# Patient Record
Sex: Male | Born: 1960
Health system: Southern US, Community
[De-identification: ages and names within clinical notes are randomized; demographics above are authoritative.]

## PROBLEM LIST (undated history)

## (undated) DIAGNOSIS — R609 Edema, unspecified: Secondary | ICD-10-CM

## (undated) DIAGNOSIS — R600 Localized edema: Secondary | ICD-10-CM

## (undated) DIAGNOSIS — N289 Disorder of kidney and ureter, unspecified: Secondary | ICD-10-CM

## (undated) DIAGNOSIS — E119 Type 2 diabetes mellitus without complications: Secondary | ICD-10-CM

## (undated) DIAGNOSIS — I509 Heart failure, unspecified: Secondary | ICD-10-CM

## (undated) DIAGNOSIS — Z992 Dependence on renal dialysis: Secondary | ICD-10-CM

## (undated) HISTORY — DX: Dependence on renal dialysis: Z99.2

## (undated) HISTORY — PX: TOE AMPUTATION: SHX809

---

## 2014-03-17 DIAGNOSIS — IMO0002 Reserved for concepts with insufficient information to code with codable children: Secondary | ICD-10-CM | POA: Insufficient documentation

## 2014-03-17 DIAGNOSIS — Z89429 Acquired absence of other toe(s), unspecified side: Secondary | ICD-10-CM | POA: Insufficient documentation

## 2016-03-26 DIAGNOSIS — I10 Essential (primary) hypertension: Secondary | ICD-10-CM | POA: Insufficient documentation

## 2016-03-26 DIAGNOSIS — E1159 Type 2 diabetes mellitus with other circulatory complications: Secondary | ICD-10-CM | POA: Insufficient documentation

## 2016-03-27 DIAGNOSIS — F17200 Nicotine dependence, unspecified, uncomplicated: Secondary | ICD-10-CM | POA: Insufficient documentation

## 2016-04-08 ENCOUNTER — Emergency Department (HOSPITAL_COMMUNITY): Payer: Self-pay

## 2016-04-08 ENCOUNTER — Emergency Department (HOSPITAL_COMMUNITY)
Admission: EM | Admit: 2016-04-08 | Discharge: 2016-04-08 | Disposition: A | Discharge status: Home / Self Care | Payer: Self-pay | Attending: Emergency Medicine | Admitting: Emergency Medicine

## 2016-04-08 ENCOUNTER — Encounter (HOSPITAL_COMMUNITY): Payer: Self-pay | Admitting: Emergency Medicine

## 2016-04-08 DIAGNOSIS — R109 Unspecified abdominal pain: Secondary | ICD-10-CM

## 2016-04-08 DIAGNOSIS — Z794 Long term (current) use of insulin: Secondary | ICD-10-CM | POA: Insufficient documentation

## 2016-04-08 DIAGNOSIS — E119 Type 2 diabetes mellitus without complications: Secondary | ICD-10-CM | POA: Insufficient documentation

## 2016-04-08 DIAGNOSIS — R1033 Periumbilical pain: Secondary | ICD-10-CM | POA: Insufficient documentation

## 2016-04-08 HISTORY — DX: Type 2 diabetes mellitus without complications: E11.9

## 2016-04-08 LAB — URINALYSIS, ROUTINE W REFLEX MICROSCOPIC
Bacteria, UA: NONE SEEN
Bilirubin Urine: NEGATIVE
Glucose, UA: 150 mg/dL — AB
Hgb urine dipstick: NEGATIVE
Ketones, ur: NEGATIVE mg/dL
Nitrite: NEGATIVE
Protein, ur: 100 mg/dL — AB
Specific Gravity, Urine: 1.011 (ref 1.005–1.030)
Squamous Epithelial / HPF: NONE SEEN
pH: 7 (ref 5.0–8.0)

## 2016-04-08 LAB — CBC
HCT: 40.3 % (ref 39.0–52.0)
Hemoglobin: 13.5 g/dL (ref 13.0–17.0)
MCH: 29.2 pg (ref 26.0–34.0)
MCHC: 33.5 g/dL (ref 30.0–36.0)
MCV: 87.2 fL (ref 78.0–100.0)
Platelets: 310 10*3/uL (ref 150–400)
RBC: 4.62 MIL/uL (ref 4.22–5.81)
RDW: 14.4 % (ref 11.5–15.5)
WBC: 7 10*3/uL (ref 4.0–10.5)

## 2016-04-08 LAB — COMPREHENSIVE METABOLIC PANEL
ALT: 14 U/L — ABNORMAL LOW (ref 17–63)
AST: 23 U/L (ref 15–41)
Albumin: 3 g/dL — ABNORMAL LOW (ref 3.5–5.0)
Alkaline Phosphatase: 109 U/L (ref 38–126)
Anion gap: 10 (ref 5–15)
BUN: 31 mg/dL — ABNORMAL HIGH (ref 6–20)
CO2: 24 mmol/L (ref 22–32)
Calcium: 9.3 mg/dL (ref 8.9–10.3)
Chloride: 100 mmol/L — ABNORMAL LOW (ref 101–111)
Creatinine, Ser: 1.64 mg/dL — ABNORMAL HIGH (ref 0.61–1.24)
GFR calc Af Amer: 52 mL/min — ABNORMAL LOW (ref >60)
GFR calc non Af Amer: 45 mL/min — ABNORMAL LOW (ref >60)
Glucose, Bld: 154 mg/dL — ABNORMAL HIGH (ref 65–99)
Potassium: 4.6 mmol/L (ref 3.5–5.1)
Sodium: 134 mmol/L — ABNORMAL LOW (ref 135–145)
Total Bilirubin: 0.4 mg/dL (ref 0.3–1.2)
Total Protein: 7.2 g/dL (ref 6.5–8.1)

## 2016-04-08 LAB — CBG MONITORING, ED: Glucose-Capillary: 163 mg/dL — ABNORMAL HIGH (ref 65–99)

## 2016-04-08 LAB — LIPASE, BLOOD: Lipase: 76 U/L — ABNORMAL HIGH (ref 11–51)

## 2016-04-08 MED ORDER — HYDROMORPHONE HCL 2 MG/ML IJ SOLN
0.5000 mg | INTRAMUSCULAR | Status: DC | PRN
  Administered 2016-04-08: 0.5 mg via INTRAVENOUS
  Filled 2016-04-08: qty 1

## 2016-04-08 MED ORDER — SODIUM CHLORIDE 0.9 % IV BOLUS (SEPSIS)
1000.0000 mL | Freq: Once | INTRAVENOUS | Status: AC
  Administered 2016-04-08: 1000 mL via INTRAVENOUS

## 2016-04-08 MED ORDER — SODIUM CHLORIDE 0.9 % IV SOLN: INTRAVENOUS | Status: DC

## 2016-04-08 MED ORDER — IOPAMIDOL (ISOVUE-300) INJECTION 61%
INTRAVENOUS | Status: AC
  Administered 2016-04-08: 100 mL
  Filled 2016-04-08: qty 100

## 2016-04-08 MED ORDER — ONDANSETRON HCL 4 MG/2ML IJ SOLN
4.0000 mg | Freq: Once | INTRAMUSCULAR | Status: AC
Start: 2016-04-08 — End: 2016-04-08
  Administered 2016-04-08: 4 mg via INTRAVENOUS
  Filled 2016-04-08: qty 2

## 2016-04-08 NOTE — ED Triage Notes (Signed)
Per [facility] called out for "diabetic problems".  Patient complains of abdominal pain that started three weeks ago.  Patient also complains of blood in his stool and vomit that he states "started in 2016 when I got out of prison".  Patient is alert and oriented at this time.  Patient has history of diabetes, CBG from EMS 161.  22g saline lock in left hand.

## 2016-04-08 NOTE — ED Provider Notes (Signed)
Care assumed from Dr. Tomi Bamberger at [facility] with plan for f/u abdominal CT.   Results:  BP 132/86   Pulse 82   Temp 98.6 F (37 C) (Oral)   Resp 22   SpO2 99%   Results for orders placed or performed during the hospital encounter of 04/08/16  Lipase, blood  Result Value Ref Range   Lipase 76 (H) 11 - 51 U/L  Comprehensive metabolic panel  Result Value Ref Range   Sodium 134 (L) 135 - 145 mmol/L   Potassium 4.6 3.5 - 5.1 mmol/L   Chloride 100 (L) 101 - 111 mmol/L   CO2 24 22 - 32 mmol/L   Glucose, Bld 154 (H) 65 - 99 mg/dL   BUN 31 (H) 6 - 20 mg/dL   Creatinine, Ser 1.64 (H) 0.61 - 1.24 mg/dL   Calcium 9.3 8.9 - 10.3 mg/dL   Total Protein 7.2 6.5 - 8.1 g/dL   Albumin 3.0 (L) 3.5 - 5.0 g/dL   AST 23 15 - 41 U/L   ALT 14 (L) 17 - 63 U/L   Alkaline Phosphatase 109 38 - 126 U/L   Total Bilirubin 0.4 0.3 - 1.2 mg/dL   GFR calc non Af Amer 45 (L) >60 mL/min   GFR calc Af Amer 52 (L) >60 mL/min   Anion gap 10 5 - 15  CBC  Result Value Ref Range   WBC 7.0 4.0 - 10.5 K/uL   RBC 4.62 4.22 - 5.81 MIL/uL   Hemoglobin 13.5 13.0 - 17.0 g/dL   HCT 40.3 39.0 - 52.0 %   MCV 87.2 78.0 - 100.0 fL   MCH 29.2 26.0 - 34.0 pg   MCHC 33.5 30.0 - 36.0 g/dL   RDW 14.4 11.5 - 15.5 %   Platelets 310 150 - 400 K/uL  Urinalysis, Routine w reflex microscopic  Result Value Ref Range   Color, Urine STRAW (A) YELLOW   APPearance CLEAR CLEAR   Specific Gravity, Urine 1.011 1.005 - 1.030   pH 7.0 5.0 - 8.0   Glucose, UA 150 (A) NEGATIVE mg/dL   Hgb urine dipstick NEGATIVE NEGATIVE   Bilirubin Urine NEGATIVE NEGATIVE   Ketones, ur NEGATIVE NEGATIVE mg/dL   Protein, ur 100 (A) NEGATIVE mg/dL   Nitrite NEGATIVE NEGATIVE   Leukocytes, UA TRACE (A) NEGATIVE   RBC / HPF 0-5 0 - 5 RBC/hpf   WBC, UA 0-5 0 - 5 WBC/hpf   Bacteria, UA NONE SEEN NONE SEEN   Squamous Epithelial / LPF NONE SEEN NONE SEEN   Mucous PRESENT   CBG monitoring, ED  Result Value Ref Range   Glucose-Capillary 163 (H) 65 - 99 mg/dL     Ct Abdomen Pelvis W Contrast  Result Date: 04/08/2016 CLINICAL DATA:  Stomach pain EXAM: CT ABDOMEN AND PELVIS WITH CONTRAST TECHNIQUE: Multidetector CT imaging of the abdomen and pelvis was performed using the standard protocol following bolus administration of intravenous contrast. CONTRAST:  175mL ISOVUE-300 IOPAMIDOL (ISOVUE-300) INJECTION 61% COMPARISON:  None. FINDINGS: Lower chest: No acute abnormality. Hepatobiliary: No focal liver abnormality is seen. Status post cholecystectomy. No biliary dilatation. Pancreas: Unremarkable. No pancreatic ductal dilatation or surrounding inflammatory changes. Spleen: Normal in size without focal abnormality. Adrenals/Urinary Tract: Adrenal glands are unremarkable. Kidneys are normal, without renal calculi, focal lesion, or hydronephrosis. Bladder is unremarkable. Stomach/Bowel: Stomach is within normal limits. Appendix appears normal. No evidence of bowel wall thickening, distention, or inflammatory changes. Vascular/Lymphatic: Aortic atherosclerosis. No enlarged abdominal or pelvic lymph nodes. Reproductive:  Prostate is unremarkable. Other: No abdominal wall hernia or abnormality. No abdominopelvic ascites. Musculoskeletal: No acute or significant osseous findings. IMPRESSION: No acute abnormality noted. Electronically Signed   By: Inez Catalina M.D.   On: 04/08/2016 18:09    Radiology and laboratory examinations were reviewed by me and used in medical decision making if performed.   MDM:  CT without acute pathologic cause of pain. Cr mildly elevated but no baseline readings available and Pt received IVF for losses. Lipase minimally elevated but non-diagnostic for pancreatitis and no obstruction findings on imaging. Plan to follow up with PCP as needed and return precautions discussed for worsening or new concerning symptoms.   Diagnoses that have been ruled out:  None  Diagnoses that are still under consideration:  None  Final diagnoses:  Abdominal  pain, unspecified abdominal location      Leo Grosser, MD 04/08/16 [phone]

## 2016-04-08 NOTE — ED Provider Notes (Signed)
[facility] DEPT Provider Note   CSN: [id number] Arrival date & time: 04/08/16  1313     History   Chief Complaint Chief Complaint  Patient presents with  . Abdominal Pain    HPI Zachary Young is a 55 y.o. male.  HPI Patient presented to the emergency room with complaints of abdominal pain. Patient states the symptoms started about 3 weeks ago. Pain is coming and going. In the last day or so the symptoms got worse and he had several episodes of nausea and vomiting. The pain is in the periumbilical region. It does not radiate.  He mentions seeing small amounts of blood in the stool in his emesis. This has been a chronic intermittent issue for years however.  He denies any dysuria. No history of any prior surgeries. Nothing seems to make it better or worse. He does not have pain associated with eating. Past Medical History:  Diagnosis Date  . Diabetes mellitus without complication ([place])     There are no active problems to display for this patient.   Past Surgical History:  Procedure Laterality Date  . TOE AMPUTATION Right    due to osteomyelitis       Home Medications    Prior to Admission medications   Medication Sig Start Date End Date Taking? Authorizing Provider  insulin glargine (LANTUS) 100 UNIT/ML injection Inject 60 Units into the skin at bedtime.   Yes Historical Provider, MD  PRESCRIPTION MEDICATION Take 1 tablet by mouth daily.   Yes Historical Provider, MD    Family History No family history on file.  Social History Social History  Substance Use Topics  . Smoking status: Not on file  . Smokeless tobacco: Not on file  . Alcohol use Not on file     Allergies   Eggs or egg-derived products   Review of Systems Review of Systems  All other systems reviewed and are negative.    Physical Exam Updated Vital Signs BP 122/80   Pulse 70   Temp 98.6 F (37 C) (Oral)   Resp 18   SpO2 96%   Physical Exam  Constitutional: He appears  well-developed and well-nourished. No distress.  HENT:  Head: Normocephalic and atraumatic.  Right Ear: External ear normal.  Left Ear: External ear normal.  Eyes: Conjunctivae are normal. Right eye exhibits no discharge. Left eye exhibits no discharge. No scleral icterus.  Neck: Neck supple. No tracheal deviation present.  Cardiovascular: Normal rate, regular rhythm and intact distal pulses.   Pulmonary/Chest: Effort normal and breath sounds normal. No stridor. No respiratory distress. He has no wheezes. He has no rales.  Abdominal: Soft. Bowel sounds are normal. He exhibits no distension. There is tenderness (mild in the periumbilical region). There is no rebound and no guarding.  Musculoskeletal: He exhibits no edema or tenderness.  Neurological: He is alert. He has normal strength. No cranial nerve deficit (no facial droop, extraocular movements intact, no slurred speech) or sensory deficit. He exhibits normal muscle tone. He displays no seizure activity. Coordination normal.  Skin: Skin is warm and dry. No rash noted.  Psychiatric: He has a normal mood and affect.  Nursing note and vitals reviewed.    ED Treatments / Results  Labs (all labs ordered are listed, but only abnormal results are displayed) Labs Reviewed  LIPASE, BLOOD - Abnormal; Notable for the following:       Result Value   Lipase 76 (*)    All other components within normal limits  COMPREHENSIVE METABOLIC PANEL - Abnormal; Notable for the following:    Sodium 134 (*)    Chloride 100 (*)    Glucose, Bld 154 (*)    BUN 31 (*)    Creatinine, Ser 1.64 (*)    Albumin 3.0 (*)    ALT 14 (*)    GFR calc non Af Amer 45 (*)    GFR calc Af Amer 52 (*)    All other components within normal limits  URINALYSIS, ROUTINE W REFLEX MICROSCOPIC - Abnormal; Notable for the following:    Color, Urine STRAW (*)    Glucose, UA 150 (*)    Protein, ur 100 (*)    Leukocytes, UA TRACE (*)    All other components within normal  limits  CBG MONITORING, ED - Abnormal; Notable for the following:    Glucose-Capillary 163 (*)    All other components within normal limits  CBC    Radiology CT abd pelvis performed  Procedures Procedures (including critical care time)  Medications Ordered in ED Medications  sodium chloride 0.9 % bolus 1,000 mL (0 mLs Intravenous Stopped 04/08/16 1658)  ondansetron (ZOFRAN) injection 4 mg (4 mg Intravenous Given 04/08/16 1506)  iopamidol (ISOVUE-300) 61 % injection (100 mLs  Contrast Given 04/08/16 1716)     Initial Impression / Assessment and Plan / ED Course  I have reviewed the triage vital signs and the nursing notes.  Pertinent labs & imaging results that were available during my care of the patient were reviewed by me and considered in my medical decision making (see chart for details).  Clinical Course as of Apr 12 1006  Sun Apr 08, 2016  1612 Pt is feeling better  [JK]    Clinical Course User Index [JK] Dorie Rank, MD   Care was turned over to Dr Laneta Simmers pending CT scan results.  CT scan without acute findings.  Sx improved during ED tx.   Dc home with outpatient follow up  Final Clinical Impressions(s) / ED Diagnoses   Final diagnoses:  Abdominal pain, unspecified abdominal location    New Prescriptions Discharge Medication List as of 04/08/2016  6:31 PM       Dorie Rank, MD 04/12/16 1008

## 2016-04-08 NOTE — ED Notes (Signed)
Patient Alert and oriented X4. Stable and ambulatory. Patient verbalized understanding of the discharge instructions.  Patient belongings were taken by the patient.  

## 2016-05-22 DIAGNOSIS — I252 Old myocardial infarction: Secondary | ICD-10-CM | POA: Insufficient documentation

## 2016-05-22 DIAGNOSIS — Z8739 Personal history of other diseases of the musculoskeletal system and connective tissue: Secondary | ICD-10-CM | POA: Insufficient documentation

## 2018-02-28 DIAGNOSIS — E785 Hyperlipidemia, unspecified: Secondary | ICD-10-CM | POA: Insufficient documentation

## 2018-04-30 DIAGNOSIS — E133399 Other specified diabetes mellitus with moderate nonproliferative diabetic retinopathy without macular edema, unspecified eye: Secondary | ICD-10-CM | POA: Insufficient documentation

## 2018-06-26 DIAGNOSIS — I5042 Chronic combined systolic (congestive) and diastolic (congestive) heart failure: Secondary | ICD-10-CM | POA: Diagnosis present

## 2018-06-26 DIAGNOSIS — I5043 Acute on chronic combined systolic (congestive) and diastolic (congestive) heart failure: Secondary | ICD-10-CM | POA: Diagnosis present

## 2018-06-26 DIAGNOSIS — N184 Chronic kidney disease, stage 4 (severe): Secondary | ICD-10-CM | POA: Diagnosis present

## 2018-12-14 ENCOUNTER — Other Ambulatory Visit: Payer: Self-pay

## 2018-12-14 ENCOUNTER — Inpatient Hospital Stay
Admission: EM | Admit: 2018-12-14 | Discharge: 2018-12-15 | DRG: 378 | Discharge status: Against Medical Advice | Payer: Medicaid Other | Attending: Internal Medicine | Admitting: Internal Medicine

## 2018-12-14 ENCOUNTER — Emergency Department: Payer: Medicaid Other

## 2018-12-14 ENCOUNTER — Encounter: Payer: Self-pay | Admitting: Emergency Medicine

## 2018-12-14 DIAGNOSIS — Z794 Long term (current) use of insulin: Secondary | ICD-10-CM

## 2018-12-14 DIAGNOSIS — K219 Gastro-esophageal reflux disease without esophagitis: Secondary | ICD-10-CM | POA: Diagnosis present

## 2018-12-14 DIAGNOSIS — K920 Hematemesis: Secondary | ICD-10-CM | POA: Diagnosis present

## 2018-12-14 DIAGNOSIS — E785 Hyperlipidemia, unspecified: Secondary | ICD-10-CM | POA: Diagnosis present

## 2018-12-14 DIAGNOSIS — N179 Acute kidney failure, unspecified: Secondary | ICD-10-CM | POA: Diagnosis present

## 2018-12-14 DIAGNOSIS — E860 Dehydration: Secondary | ICD-10-CM | POA: Diagnosis present

## 2018-12-14 DIAGNOSIS — Z885 Allergy status to narcotic agent status: Secondary | ICD-10-CM | POA: Diagnosis not present

## 2018-12-14 DIAGNOSIS — Z91030 Bee allergy status: Secondary | ICD-10-CM | POA: Diagnosis not present

## 2018-12-14 DIAGNOSIS — I110 Hypertensive heart disease with heart failure: Secondary | ICD-10-CM | POA: Diagnosis present

## 2018-12-14 DIAGNOSIS — I509 Heart failure, unspecified: Secondary | ICD-10-CM | POA: Diagnosis present

## 2018-12-14 DIAGNOSIS — F17210 Nicotine dependence, cigarettes, uncomplicated: Secondary | ICD-10-CM | POA: Diagnosis present

## 2018-12-14 DIAGNOSIS — K274 Chronic or unspecified peptic ulcer, site unspecified, with hemorrhage: Secondary | ICD-10-CM | POA: Diagnosis present

## 2018-12-14 DIAGNOSIS — Z79899 Other long term (current) drug therapy: Secondary | ICD-10-CM

## 2018-12-14 DIAGNOSIS — D62 Acute posthemorrhagic anemia: Secondary | ICD-10-CM | POA: Diagnosis present

## 2018-12-14 DIAGNOSIS — E1165 Type 2 diabetes mellitus with hyperglycemia: Secondary | ICD-10-CM | POA: Diagnosis present

## 2018-12-14 DIAGNOSIS — Z89421 Acquired absence of other right toe(s): Secondary | ICD-10-CM | POA: Diagnosis not present

## 2018-12-14 DIAGNOSIS — K2971 Gastritis, unspecified, with bleeding: Secondary | ICD-10-CM | POA: Diagnosis present

## 2018-12-14 DIAGNOSIS — Z91012 Allergy to eggs: Secondary | ICD-10-CM

## 2018-12-14 DIAGNOSIS — Z20828 Contact with and (suspected) exposure to other viral communicable diseases: Secondary | ICD-10-CM | POA: Diagnosis present

## 2018-12-14 DIAGNOSIS — K922 Gastrointestinal hemorrhage, unspecified: Secondary | ICD-10-CM | POA: Diagnosis present

## 2018-12-14 LAB — CBC WITH DIFFERENTIAL/PLATELET
Abs Immature Granulocytes: 0.02 10*3/uL (ref 0.00–0.07)
Basophils Absolute: 0 10*3/uL (ref 0.0–0.1)
Basophils Relative: 1 %
Eosinophils Absolute: 0.1 10*3/uL (ref 0.0–0.5)
Eosinophils Relative: 2 %
HCT: 38.8 % — ABNORMAL LOW (ref 39.0–52.0)
Hemoglobin: 12.5 g/dL — ABNORMAL LOW (ref 13.0–17.0)
Immature Granulocytes: 0 %
Lymphocytes Relative: 27 %
Lymphs Abs: 2.1 10*3/uL (ref 0.7–4.0)
MCH: 25.7 pg — ABNORMAL LOW (ref 26.0–34.0)
MCHC: 32.2 g/dL (ref 30.0–36.0)
MCV: 79.8 fL — ABNORMAL LOW (ref 80.0–100.0)
Monocytes Absolute: 0.6 10*3/uL (ref 0.1–1.0)
Monocytes Relative: 7 %
Neutro Abs: 5 10*3/uL (ref 1.7–7.7)
Neutrophils Relative %: 63 %
Platelets: 321 10*3/uL (ref 150–400)
RBC: 4.86 MIL/uL (ref 4.22–5.81)
RDW: 16 % — ABNORMAL HIGH (ref 11.5–15.5)
WBC: 7.8 10*3/uL (ref 4.0–10.5)
nRBC: 0 % (ref 0.0–0.2)

## 2018-12-14 LAB — COMPREHENSIVE METABOLIC PANEL
ALT: 12 U/L (ref 0–44)
AST: 13 U/L — ABNORMAL LOW (ref 15–41)
Albumin: 2.5 g/dL — ABNORMAL LOW (ref 3.5–5.0)
Alkaline Phosphatase: 171 U/L — ABNORMAL HIGH (ref 38–126)
Anion gap: 9 (ref 5–15)
BUN: 22 mg/dL — ABNORMAL HIGH (ref 6–20)
CO2: 26 mmol/L (ref 22–32)
Calcium: 8 mg/dL — ABNORMAL LOW (ref 8.9–10.3)
Chloride: 97 mmol/L — ABNORMAL LOW (ref 98–111)
Creatinine, Ser: 2.21 mg/dL — ABNORMAL HIGH (ref 0.61–1.24)
GFR calc Af Amer: 37 mL/min — ABNORMAL LOW (ref >60)
GFR calc non Af Amer: 32 mL/min — ABNORMAL LOW (ref >60)
Glucose, Bld: 408 mg/dL — ABNORMAL HIGH (ref 70–99)
Potassium: 3.5 mmol/L (ref 3.5–5.1)
Sodium: 132 mmol/L — ABNORMAL LOW (ref 135–145)
Total Bilirubin: 0.3 mg/dL (ref 0.3–1.2)
Total Protein: 7.1 g/dL (ref 6.5–8.1)

## 2018-12-14 LAB — TYPE AND SCREEN
ABO/RH(D): B POS
Antibody Screen: NEGATIVE

## 2018-12-14 LAB — GLUCOSE, CAPILLARY: Glucose-Capillary: 372 mg/dL — ABNORMAL HIGH (ref 70–99)

## 2018-12-14 LAB — PROTIME-INR
INR: 1 (ref 0.8–1.2)
Prothrombin Time: 13.2 seconds (ref 11.4–15.2)

## 2018-12-14 LAB — TROPONIN I (HIGH SENSITIVITY)
Troponin I (High Sensitivity): 21 ng/L — ABNORMAL HIGH (ref <18)
Troponin I (High Sensitivity): 20 ng/L — ABNORMAL HIGH (ref <18)

## 2018-12-14 LAB — APTT: aPTT: 31 seconds (ref 24–36)

## 2018-12-14 LAB — SARS CORONAVIRUS 2 BY RT PCR (HOSPITAL ORDER, PERFORMED IN ~~LOC~~ HOSPITAL LAB): SARS Coronavirus 2: NEGATIVE

## 2018-12-14 MED ORDER — METOCLOPRAMIDE HCL 5 MG/ML IJ SOLN
10.0000 mg | Freq: Once | INTRAMUSCULAR | Status: AC
  Administered 2018-12-14: 10 mg via INTRAVENOUS
  Filled 2018-12-14: qty 2

## 2018-12-14 MED ORDER — SODIUM CHLORIDE 0.9 % IV SOLN
INTRAVENOUS | Status: DC
  Administered 2018-12-15: 01:00:00 via INTRAVENOUS

## 2018-12-14 MED ORDER — INSULIN ASPART 100 UNIT/ML ~~LOC~~ SOLN
0.0000 [IU] | Freq: Every day | SUBCUTANEOUS | Status: DC
  Administered 2018-12-14: 5 [IU] via SUBCUTANEOUS

## 2018-12-14 MED ORDER — ONDANSETRON HCL 4 MG/2ML IJ SOLN: 4.0000 mg | Freq: Four times a day (QID) | INTRAMUSCULAR | Status: DC | PRN

## 2018-12-14 MED ORDER — SODIUM CHLORIDE 0.9 % IV SOLN
80.0000 mg | Freq: Once | INTRAVENOUS | Status: AC
  Administered 2018-12-14: 80 mg via INTRAVENOUS
  Filled 2018-12-14: qty 80

## 2018-12-14 MED ORDER — ONDANSETRON HCL 4 MG PO TABS: 4.0000 mg | ORAL_TABLET | Freq: Four times a day (QID) | ORAL | Status: DC | PRN

## 2018-12-14 MED ORDER — ONDANSETRON HCL 4 MG/2ML IJ SOLN
4.0000 mg | Freq: Once | INTRAMUSCULAR | Status: AC
  Administered 2018-12-14: 4 mg via INTRAVENOUS
  Filled 2018-12-14: qty 2

## 2018-12-14 MED ORDER — INSULIN ASPART 100 UNIT/ML ~~LOC~~ SOLN
0.0000 [IU] | Freq: Three times a day (TID) | SUBCUTANEOUS | Status: DC
  Administered 2018-12-15: 8 [IU] via SUBCUTANEOUS
  Filled 2018-12-14: qty 1

## 2018-12-14 MED ORDER — SODIUM CHLORIDE 0.9% FLUSH
3.0000 mL | Freq: Two times a day (BID) | INTRAVENOUS | Status: DC
  Administered 2018-12-15: 3 mL via INTRAVENOUS

## 2018-12-14 MED ORDER — INSULIN ASPART 100 UNIT/ML ~~LOC~~ SOLN
SUBCUTANEOUS | Status: AC
  Filled 2018-12-14: qty 1

## 2018-12-14 MED ORDER — GABAPENTIN 400 MG PO CAPS: 400.0000 mg | ORAL_CAPSULE | Freq: Every day | ORAL | Status: DC

## 2018-12-14 MED ORDER — SODIUM CHLORIDE 0.9 % IV SOLN
80.0000 mg | Freq: Once | INTRAVENOUS | Status: DC
  Filled 2018-12-14: qty 80

## 2018-12-14 MED ORDER — IOHEXOL 300 MG/ML  SOLN
100.0000 mL | Freq: Once | INTRAMUSCULAR | Status: AC | PRN
  Administered 2018-12-14: 100 mL via INTRAVENOUS

## 2018-12-14 MED ORDER — HYDROMORPHONE HCL 1 MG/ML IJ SOLN
1.0000 mg | Freq: Once | INTRAMUSCULAR | Status: AC
  Administered 2018-12-14: 1 mg via INTRAVENOUS
  Filled 2018-12-14: qty 1

## 2018-12-14 MED ORDER — AMLODIPINE BESYLATE 5 MG PO TABS
10.0000 mg | ORAL_TABLET | Freq: Every day | ORAL | Status: DC
  Filled 2018-12-14: qty 2

## 2018-12-14 MED ORDER — PANTOPRAZOLE SODIUM 40 MG IV SOLR: 40.0000 mg | Freq: Two times a day (BID) | INTRAVENOUS | Status: DC

## 2018-12-14 MED ORDER — FUROSEMIDE 40 MG PO TABS
80.0000 mg | ORAL_TABLET | Freq: Every day | ORAL | Status: DC
  Filled 2018-12-14: qty 2

## 2018-12-14 MED ORDER — INSULIN GLARGINE 100 UNIT/ML ~~LOC~~ SOLN
10.0000 [IU] | Freq: Every day | SUBCUTANEOUS | Status: DC
  Filled 2018-12-14 (×2): qty 0.1

## 2018-12-14 MED ORDER — SODIUM CHLORIDE 0.9 % IV SOLN
8.0000 mg/h | INTRAVENOUS | Status: DC
  Administered 2018-12-15: 8 mg/h via INTRAVENOUS
  Filled 2018-12-14: qty 80

## 2018-12-14 MED ORDER — FENTANYL CITRATE (PF) 100 MCG/2ML IJ SOLN
100.0000 ug | Freq: Once | INTRAMUSCULAR | Status: AC
  Administered 2018-12-14: 100 ug via INTRAVENOUS
  Filled 2018-12-14: qty 2

## 2018-12-14 MED ORDER — ATORVASTATIN CALCIUM 20 MG PO TABS: 80.0000 mg | ORAL_TABLET | Freq: Every day | ORAL | Status: DC

## 2018-12-14 MED ORDER — METOPROLOL SUCCINATE ER 50 MG PO TB24
100.0000 mg | ORAL_TABLET | Freq: Every day | ORAL | Status: DC
  Filled 2018-12-14: qty 2

## 2018-12-14 NOTE — ED Triage Notes (Signed)
Pt presents to ED c/o hematemesis since last night. Pt states x4 episodes. Denies diarrhea. C/o 10/10 mid abd pain. CBG 491 with EMS, hx DM. VS WNL.

## 2018-12-14 NOTE — H&P (Signed)
[facility] NAME: Zachary Young    MR#:  [id number]  DATE OF BIRTH:  [birth year]  DATE OF ADMISSION:  12/14/2018  PRIMARY CARE PHYSICIAN: Patient, No Pcp Per   REQUESTING/REFERRING PHYSICIAN: Marjean Donna, MD CHIEF COMPLAINT:   Chief Complaint  Patient presents with  . GI Bleeding    HISTORY OF PRESENT ILLNESS:  Zachary Young  is a 58 y.o. male with a known history of diabetes, hypertension, heart failure who presents with hematemesis.  Patient endorses 4 episodes of large-volume dark bloody hematemesis.  Denies any vomiting without blood in it prior to arrival.  S/S have been intermittent, onset last night.  Nothing makes it better or worse.  He has associated upper abdominal cramping pain.  He denies diarrhea.  Denies NSAID use.  Denies alcohol use or liver issues.  He endorses a history of diabetes mellitus and GERD.  No family members or close contacts with similar symptoms.  He denies a prior history of GI bleed.  Hemoglobin is 12.5 with hematocrit 38.8 and platelet count 321 on arrival.  Creatinine is 2.21 with BUN 22.  Glucose is 408 on arrival.  Rapid COVID-19 testing is negative.  Shows no acute findings.  CT abdomen and pelvis demonstrates no acute findings.  We have admitted him to the hospitalist service for further management.  PAST MEDICAL HISTORY:   Past Medical History:  Diagnosis Date  . Diabetes mellitus without complication ([place])     PAST SURGICAL HISTORY:   Past Surgical History:  Procedure Laterality Date  . TOE AMPUTATION Right    due to osteomyelitis    SOCIAL HISTORY:   Social History   Tobacco Use  . Smoking status: Current Every Day Smoker    Packs/day: 1.00    Types: Cigarettes  . Smokeless tobacco: Never Used  Substance Use Topics  . Alcohol use: Not Currently    FAMILY HISTORY:  History reviewed. No pertinent family history.  DRUG ALLERGIES:   Allergies  Allergen Reactions  .  Bee Venom   . Eggs Or Egg-Derived Products   . Morphine And Related     REVIEW OF SYSTEMS:   Review of Systems  Constitutional: Positive for malaise/fatigue. Negative for chills and fever.  HENT: Negative for congestion and sore throat.   Eyes: Negative for blurred vision and double vision.  Respiratory: Negative for cough, hemoptysis, shortness of breath and wheezing.   Cardiovascular: Negative for chest pain and palpitations.  Gastrointestinal: Positive for abdominal pain, nausea (Hematemesis) and vomiting. Negative for blood in stool, constipation, diarrhea, heartburn and melena.  Genitourinary: Negative for dysuria, flank pain and hematuria.  Musculoskeletal: Negative for falls and myalgias.  Skin: Negative for itching and rash.  Neurological: Negative for dizziness, focal weakness and headaches.  Psychiatric/Behavioral: Negative for depression.   MEDICATIONS AT HOME:   Prior to Admission medications   Medication Sig Start Date End Date Taking? Authorizing Provider  amLODipine (NORVASC) 10 MG tablet Take 10 mg by mouth daily.  08/04/18  Yes [provider]  atorvastatin (LIPITOR) 80 MG tablet Take 80 mg by mouth daily at 6 PM.  08/04/18  Yes [provider]  furosemide (LASIX) 80 MG tablet Take 80 mg by mouth daily.  12/03/18 03/03/19 Yes [provider]  gabapentin (NEURONTIN) 400 MG capsule Take 400 mg by mouth at bedtime.  08/15/18  Yes [provider]  insulin glargine (LANTUS) 100 UNIT/ML injection Inject 10 Units into the  skin at bedtime.    Yes [provider]  insulin lispro (HUMALOG) 100 UNIT/ML injection Inject into the skin 3 (three) times daily with meals.  12/03/18 01/02/19 Yes [provider]  metoprolol succinate (TOPROL-XL) 100 MG 24 hr tablet Take 100 mg by mouth daily.  12/04/18 01/03/19 Yes [provider]  oxyCODONE-acetaminophen (PERCOCET/ROXICET) 5-325 MG tablet Take 1-2 tablets by mouth every 6 (six) hours  as needed.  12/09/18 12/14/18 Yes [provider]  polyethylene glycol (MIRALAX / GLYCOLAX) 17 g packet Take by mouth. 12/03/18 01/02/19 Yes [provider]  PRESCRIPTION MEDICATION Take 1 tablet by mouth daily.   Yes [provider]      VITAL SIGNS:  Blood pressure 101/86, pulse 90, temperature 98.8 F (37.1 C), temperature source Oral, resp. rate 18, height 6\' 2"  (1.88 m), weight (!) 140.6 kg, SpO2 100 %.  PHYSICAL EXAMINATION:  Physical Exam  GENERAL:  58 y.o.-year-old patient lying in the bed with no acute distress.  EYES: Pupils equal, round, reactive to light and accommodation. No scleral icterus. Extraocular muscles intact.  HEENT: Head atraumatic, normocephalic. Oropharynx and nasopharynx clear.  NECK:  Supple, no jugular venous distention. No thyroid enlargement, no tenderness.  LUNGS: Normal breath sounds bilaterally, no wheezing, rales,rhonchi or crepitation. No use of accessory muscles of respiration.  CARDIOVASCULAR: Regular rate and rhythm, S1, S2 normal. No murmurs, rubs, or gallops.  ABDOMEN: Soft, nondistended, diffuse upper abdominal tenderness. Bowel sounds present. No organomegaly or mass.  EXTREMITIES: No pedal edema, cyanosis, or clubbing.  NEUROLOGIC: Cranial nerves II through XII are intact. Muscle strength 5/5 in all extremities. Sensation intact. Gait not checked.  PSYCHIATRIC: The patient is alert and oriented x 3.  Normal affect and good eye contact. SKIN: No obvious rash, lesion, or ulcer.   LABORATORY PANEL:   CBC Recent Labs  Lab 12/14/18 1925  WBC 7.8  HGB 12.5*  HCT 38.8*  PLT 321   ------------------------------------------------------------------------------------------------------------------  Chemistries  Recent Labs  Lab 12/14/18 1925  NA 132*  K 3.5  CL 97*  CO2 26  GLUCOSE 408*  BUN 22*  CREATININE 2.21*  CALCIUM 8.0*  AST 13*  ALT 12  ALKPHOS 171*  BILITOT 0.3    ------------------------------------------------------------------------------------------------------------------  Cardiac Enzymes No results for input(s): TROPONINI in the last 168 hours. ------------------------------------------------------------------------------------------------------------------  RADIOLOGY:  Ct Abdomen Pelvis W Contrast  Result Date: 12/14/2018 CLINICAL DATA:  58 year old male with acute abdominal and pelvic pain and vomiting. EXAM: CT ABDOMEN AND PELVIS WITH CONTRAST TECHNIQUE: Multidetector CT imaging of the abdomen and pelvis was performed using the standard protocol following bolus administration of intravenous contrast. CONTRAST:  123mL OMNIPAQUE IOHEXOL 300 MG/ML  SOLN COMPARISON:  04/08/2016 CT FINDINGS: Lower chest: No acute abnormality Hepatobiliary: The liver is unremarkable. The patient is status post cholecystectomy. No biliary dilatation. Pancreas: Unremarkable Spleen: Unremarkable Adrenals/Urinary Tract: The kidneys, adrenal glands and bladder are unremarkable. Stomach/Bowel: Stomach is within normal limits. Appendix appears normal. No evidence of bowel wall thickening, distention, or inflammatory changes. Vascular/Lymphatic: Aortic atherosclerotic calcifications noted without evidence of aneurysm. [place] bilateral inguinal and pelvic lymph nodes are unchanged. No new or enlarging lymph nodes are identified. Reproductive: Prostate is unremarkable. Other: A small RIGHT inguinal hernia containing fat is unchanged. There is no evidence of ascites, focal collection or pneumoperitoneum. Musculoskeletal: No acute or suspicious bony abnormality. IMPRESSION: 1. No evidence of acute abnormality. No findings to suggest a cause for this patient's abdominal pain. 2.  Aortic Atherosclerosis (ICD10-I70.0). Electronically Signed  By: Margarette Canada M.D.   On: 12/14/2018 20:34   Dg Chest Portable 1 View  Result Date: 12/14/2018 CLINICAL DATA:  Acute shortness of breath EXAM:  PORTABLE CHEST 1 VIEW COMPARISON:  None FINDINGS: The cardiomediastinal silhouette is unremarkable. Mild peribronchial thickening noted. There is no evidence of focal airspace disease, pulmonary edema, suspicious pulmonary nodule/mass, pleural effusion, or pneumothorax. No acute bony abnormalities are identified. IMPRESSION: Mild peribronchial thickening of uncertain chronicity. Electronically Signed   By: Margarette Canada M.D.   On: 12/14/2018 19:12      IMPRESSION AND PLAN:   1.  GI bleed - With large-volume hemoptysis - We will continue monitoring hemoglobin and hematocrit every 6 hours and transfuse as indicated - Dr. Alice Reichert with gastroenterology has been consulted for further evaluation and recommendations - We will hold patient n.p.o. for possible endoscopy in the a.m. -Protonix infusion initiated  2.  Acute renal failure - Possibly secondary to volume loss with vomiting. - We will continue normal saline to peripheral IV at 75 cc/h -Will repeat BMP and continue to monitor renal function closely  3.  Abdominal pain -We will treat with IV analgesic  4.  Diabetes mellitus - Moderate sliding scale insulin  DVT prophylaxis with SCDs and PPI initiated    All the records are reviewed and case discussed with ED provider. The plan of care was discussed in details with the patient (and family). I answered all questions. The patient agreed to proceed with the above mentioned plan. Further management will depend upon hospital course.   CODE STATUS: Full code  TOTAL TIME TAKING CARE OF THIS PATIENT: 45 minutes.    [place] 12/14/2018 at 11:07 PM  Pager - [phone]  After 6pm go to [email or web address] - Proofreader  [facility]  Hospitalists  Office  [phone]  CC: Primary care physician; Patient, No Pcp Per   Note: This dictation was prepared with Dragon dictation along with smaller phrase technology. Any transcriptional errors that result from  this process are unintentional.

## 2018-12-14 NOTE — ED Provider Notes (Signed)
[facility] Emergency Department Provider Note  ____________________________________________   First MD Initiated Contact with Patient 12/14/18 1823     (approximate)  I have reviewed the triage vital signs and the nursing notes.   HISTORY  Chief Complaint GI Bleeding    HPI Zachary Young is a 58 y.o. male with diabetes, hypertension, heart failure who presents with hematemesis.  Patient endorses 4 episodes of large-volume o hematemesis.  Denies any vomiting without blood in it prior to arrival.  He is been intermittent, onset last night.  Nothing makes it better or worse.  He has associated severe abdominal pain.  He denies diarrhea.  Denies NSAID use.  Denies alcohol use or liver issues.          Past Medical History:  Diagnosis Date  . Diabetes mellitus without complication ([place])     There are no active problems to display for this patient.   Past Surgical History:  Procedure Laterality Date  . TOE AMPUTATION Right    due to osteomyelitis    Prior to Admission medications   Medication Sig Start Date End Date Taking? Authorizing Provider  insulin glargine (LANTUS) 100 UNIT/ML injection Inject 60 Units into the skin at bedtime.    [provider]  PRESCRIPTION MEDICATION Take 1 tablet by mouth daily.    [provider]    Allergies Eggs or egg-derived products  History reviewed. No pertinent family history.  Social History Social History   Tobacco Use  . Smoking status: Current Every Day Smoker    Packs/day: 1.00    Types: Cigarettes  . Smokeless tobacco: Never Used  Substance Use Topics  . Alcohol use: Not Currently  . Drug use: Never      Review of Systems Constitutional: No fever/chills Eyes: No visual changes. ENT: No sore throat. Cardiovascular: Denies chest pain. Respiratory: Positive shortness of breath but sounds like it that is at his baseline Gastrointestinal: Positive dull pain.  Positive  vomiting blood Genitourinary: Negative for dysuria. Musculoskeletal: Negative for back pain. Skin: Negative for rash. Neurological: Negative for headaches, focal weakness or numbness. All other ROS negative ____________________________________________   PHYSICAL EXAM:  VITAL SIGNS: ED Triage Vitals [12/14/18 1819]  Enc Vitals Group     BP (!) 159/83     Pulse Rate 85     Resp 15     Temp 98.7 F (37.1 C)     Temp Source Oral     SpO2 97 %     Weight (!) 310 lb (140.6 kg)     Height 6\' 2"  (1.88 m)     Head Circumference      Peak Flow      Pain Score 10     Pain Loc      Pain Edu?      Excl. in [place]?     Constitutional: Alert and oriented.  Overweight male Eyes: Conjunctivae are normal. EOMI. Head: Atraumatic. Nose: No congestion/rhinnorhea. Mouth/Throat: Mucous membranes are moist.   Neck: No stridor. Trachea Midline. FROM Cardiovascular: Normal rate, regular rhythm. Grossly normal heart sounds.  Good peripheral circulation. Respiratory: Normal respiratory effort.  No retractions. Lungs CTAB. Gastrointestinal: Diffuse tenderness.  Mild distention  Musculoskeletal: Baseline edema 2+ no joint effusions.  Multiple amputation of toes Neurologic:  Normal speech and language. No gross focal neurologic deficits are appreciated.  Skin:  Skin is warm, dry and intact. No rash noted. Psychiatric: Mood and affect are normal. Speech and behavior are normal. GU:  Deferred   ____________________________________________   LABS (all labs ordered are listed, but only abnormal results are displayed)  Labs Reviewed  SARS CORONAVIRUS 2 (HOSPITAL ORDER, [place] [facility])  CBC WITH DIFFERENTIAL/PLATELET  COMPREHENSIVE METABOLIC PANEL  PROTIME-INR  APTT  URINALYSIS, ROUTINE W REFLEX MICROSCOPIC  TYPE AND SCREEN   ____________________________________________   ED ECG REPORT I, Vanessa [place], the attending physician, personally viewed and interpreted this ECG.   EKG is normal sinus rate of 83, some ST sloping consistent with probably early re-pole versus LVH, T wave inversion, normal intervals ____________________________________________  RADIOLOGY Robert Bellow, personally viewed and evaluated these images (plain radiographs) as part of my medical decision making, as well as reviewing the written report by the radiologist.  ED MD interpretation:  No pna Official radiology report(s): Dg Chest Portable 1 View  Result Date: 12/14/2018 CLINICAL DATA:  Acute shortness of breath EXAM: PORTABLE CHEST 1 VIEW COMPARISON:  None FINDINGS: The cardiomediastinal silhouette is unremarkable. Mild peribronchial thickening noted. There is no evidence of focal airspace disease, pulmonary edema, suspicious pulmonary nodule/mass, pleural effusion, or pneumothorax. No acute bony abnormalities are identified. IMPRESSION: Mild peribronchial thickening of uncertain chronicity. Electronically Signed   By: Margarette Canada M.D.   On: 12/14/2018 19:12    ____________________________________________   PROCEDURES  Procedure(s) performed (including Critical Care):  Ultrasound ED Peripheral IV (Provider)  Date/Time: 12/14/2018 7:24 PM Performed by: Vanessa [place], MD Authorized by: Vanessa [place], MD   Procedure details:    Indications: multiple failed IV attempts and poor IV access     Skin Prep: chlorhexidine gluconate     Location:  Left AC   Angiocath:  18 G   Bedside Ultrasound Guided: Yes     Images: not archived     Patient tolerated procedure without complications: No     Dressing applied: Yes       ____________________________________________   INITIAL IMPRESSION / ASSESSMENT AND PLAN / ED COURSE  Zachary Young was evaluated in Emergency Department on 12/14/2018 for the symptoms described in the history of present illness. He was evaluated in the context of the global COVID-19 pandemic, which necessitated consideration that the patient might be at risk  for infection with the SARS-CoV-2 virus that causes COVID-19. Institutional protocols and algorithms that pertain to the evaluation of patients at risk for COVID-19 are in a state of rapid change based on information released by regulatory bodies including the CDC and federal and state organizations. These policies and algorithms were followed during the patient's care in the ED.    Patient presents with vomiting blood.  This is concerning for gastritis, ulcer.  Patient denies history of liver issues or alcohol use she is chest varices.  Patient does have some abdominal tenderness so we will get CT scan to rule out SBO versus ulcer rupture.  We will give patient Protonix, get good IV access and evaluate for anemia.  Patient does endorse edema in his legs that is baseline for him.  Low suspicion for DVT.  Kidney function is elevated 2.2 up from baseline of 1.6.  White count is normal.  Hemoglobin is slightly down to 12.5.  CT scan was negative.  Chest x-ray negative   Initial troponin 21, pt denies chest pain.   Clinical Course as of Dec 13 2036  Nancy Fetter Dec 14, 2018  2006 Creatinine(!): 2.21 [MF]    Clinical Course User Index [MF] Vanessa Jasper, MD    Discussed  with [facility] and they are accepting transfers based on patient preference.  Discussed with patient and he is okay with staying here.  Discussed with hospital team and they will admit pt.   ____________________________________________   FINAL CLINICAL IMPRESSION(S) / ED DIAGNOSES   Final diagnoses:  AKI (acute kidney injury) ([place])  Hematemesis, presence of nausea not specified      MEDICATIONS GIVEN DURING THIS VISIT:  Medications  HYDROmorphone (DILAUDID) injection 1 mg (has no administration in time range)  metoCLOPramide (REGLAN) injection 10 mg (10 mg Intravenous Given 12/14/18 1926)  ondansetron (ZOFRAN) injection 4 mg (4 mg Intravenous Given 12/14/18 1925)  fentaNYL (SUBLIMAZE) injection 100 mcg (100 mcg Intravenous Given  12/14/18 1926)  pantoprazole (PROTONIX) 80 mg in sodium chloride 0.9 % 100 mL IVPB (80 mg Intravenous New Bag/Given 12/14/18 2042)  iohexol (OMNIPAQUE) 300 MG/ML solution 100 mL (100 mLs Intravenous Contrast Given 12/14/18 2013)     ED Discharge Orders    None       Note:  This document was prepared using Dragon voice recognition software and may include unintentional dictation errors.   Vanessa [place], MD 12/14/18 2117

## 2018-12-14 NOTE — ED Notes (Signed)
In to meet pt and check on him; sitting on the side of the bed after using the toilet in the room; pt says he would prefer to sit there for a little bit, more comfortable; pt awake and alert; talking in complete coherent sentences; no complaints or requests at this time; understands waiting on room assignment for admission

## 2018-12-14 NOTE — Progress Notes (Signed)
Family Meeting Note  Advance Directive:yes  Today a meeting took place with the Patient.  The following clinical team members were present during this meeting:MD  The following were discussed:Patient's diagnosis: GI bleed, DM, amputations of toes due to DM, Patient's progosis: Unable to determine and Goals for treatment: Full Code  Additional follow-up to be provided: GI  Time spent during discussion:20 minutes  Vaughan Basta, MD

## 2018-12-15 ENCOUNTER — Encounter
Admission: EM | Discharge status: Against Medical Advice | Payer: Self-pay | Source: Home / Self Care | Attending: Internal Medicine

## 2018-12-15 LAB — BASIC METABOLIC PANEL
Anion gap: 9 (ref 5–15)
BUN: 20 mg/dL (ref 6–20)
CO2: 25 mmol/L (ref 22–32)
Calcium: 7.9 mg/dL — ABNORMAL LOW (ref 8.9–10.3)
Chloride: 103 mmol/L (ref 98–111)
Creatinine, Ser: 2.09 mg/dL — ABNORMAL HIGH (ref 0.61–1.24)
GFR calc Af Amer: 40 mL/min — ABNORMAL LOW (ref >60)
GFR calc non Af Amer: 34 mL/min — ABNORMAL LOW (ref >60)
Glucose, Bld: 274 mg/dL — ABNORMAL HIGH (ref 70–99)
Potassium: 3.3 mmol/L — ABNORMAL LOW (ref 3.5–5.1)
Sodium: 137 mmol/L (ref 135–145)

## 2018-12-15 LAB — HEMOGLOBIN A1C
Hgb A1c MFr Bld: 11.1 % — ABNORMAL HIGH (ref 4.8–5.6)
Mean Plasma Glucose: 271.87 mg/dL

## 2018-12-15 LAB — URINALYSIS, ROUTINE W REFLEX MICROSCOPIC
Bacteria, UA: NONE SEEN
Bilirubin Urine: NEGATIVE
Glucose, UA: >=500 mg/dL — AB
Ketones, ur: NEGATIVE mg/dL
Nitrite: NEGATIVE
Protein, ur: >=300 mg/dL — AB
Specific Gravity, Urine: 1.02 (ref 1.005–1.030)
pH: 6 (ref 5.0–8.0)

## 2018-12-15 LAB — CBC
HCT: 39.5 % (ref 39.0–52.0)
Hemoglobin: 12.6 g/dL — ABNORMAL LOW (ref 13.0–17.0)
MCH: 25.8 pg — ABNORMAL LOW (ref 26.0–34.0)
MCHC: 31.9 g/dL (ref 30.0–36.0)
MCV: 80.8 fL (ref 80.0–100.0)
Platelets: 284 10*3/uL (ref 150–400)
RBC: 4.89 MIL/uL (ref 4.22–5.81)
RDW: 16 % — ABNORMAL HIGH (ref 11.5–15.5)
WBC: 6 10*3/uL (ref 4.0–10.5)
nRBC: 0 % (ref 0.0–0.2)

## 2018-12-15 LAB — PROTIME-INR
INR: 1 (ref 0.8–1.2)
Prothrombin Time: 13 seconds (ref 11.4–15.2)

## 2018-12-15 LAB — GLUCOSE, CAPILLARY: Glucose-Capillary: 291 mg/dL — ABNORMAL HIGH (ref 70–99)

## 2018-12-15 SURGERY — EGD (ESOPHAGOGASTRODUODENOSCOPY)
Anesthesia: General

## 2018-12-15 MED ORDER — POTASSIUM CHLORIDE 10 MEQ/100ML IV SOLN
10.0000 meq | INTRAVENOUS | Status: AC
  Administered 2018-12-15: 10 meq via INTRAVENOUS
  Filled 2018-12-15 (×2): qty 100

## 2018-12-15 MED ORDER — HYDROMORPHONE HCL 1 MG/ML IJ SOLN
INTRAMUSCULAR | Status: AC
  Filled 2018-12-15: qty 1

## 2018-12-15 MED ORDER — HYDROMORPHONE HCL 1 MG/ML IJ SOLN
0.5000 mg | INTRAMUSCULAR | Status: DC | PRN
  Administered 2018-12-15: 0.5 mg via INTRAVENOUS

## 2018-12-15 MED ORDER — HYDRALAZINE HCL 20 MG/ML IJ SOLN
10.0000 mg | INTRAMUSCULAR | Status: DC | PRN
  Administered 2018-12-15: 10 mg via INTRAVENOUS
  Filled 2018-12-15: qty 1

## 2018-12-15 NOTE — ED Notes (Signed)
Eyes closed, resp even and unlabored 

## 2018-12-15 NOTE — ED Notes (Signed)
Bed marked ready; attempted to call report, RN not available

## 2018-12-15 NOTE — Progress Notes (Signed)
1204--Pt refusing vital signs, capillary blood glucose testing, and medications. Pt states he wants to leave AMA.  Dr. Benjie Karvonen contacted about pt's wishes, and will come to pt bedside to speak with pt.  1220--Dr. Benjie Karvonen at bedside.  1235--AMA form signed.  PIV removed.  Cardiac monitoring discontinued.  1245--Pt escorted via w/c to [facility] for transportation via [place].

## 2018-12-15 NOTE — ED Notes (Signed)
Verified duplicate Protonix bolus with admitting provider; cancelled order;

## 2018-12-15 NOTE — ED Notes (Signed)
Attempt to call report, rn not available. 

## 2018-12-15 NOTE — ED Notes (Signed)
In to give pt medication for pain as requested; sleeping with even resp; awakened when name called; says his pain went down a little bit to 9/10

## 2018-12-15 NOTE — ED Notes (Signed)
Pt understands we're still waiting for a bed for admission; side rails up x 2, call bell in reach; pt watching tv; IV site unremarkable with fluids infusing without difficulty

## 2018-12-15 NOTE — Progress Notes (Signed)
[place] [facility] [place] NAME: Zachary Young    MR#:  [id number]  DATE OF BIRTH:  05/03/1960  SUBJECTIVE:   Patient is very upset that he has not been able to eat all day. We discussed that he cannot eat prior to his endoscopy. Patient states he is not treated like this at other hospitals.  No additional episodes of hematemesis.  REVIEW OF SYSTEMS:  Review of Systems  Constitutional: Negative for chills and fever.  HENT: Negative for congestion and sore throat.   Eyes: Negative for blurred vision and double vision.  Respiratory: Negative for cough and shortness of breath.   Cardiovascular: Negative for chest pain and palpitations.  Gastrointestinal: Negative for abdominal pain, blood in stool, melena, nausea and vomiting.  Genitourinary: Negative for dysuria and urgency.  Musculoskeletal: Negative for back pain and neck pain.  Neurological: Negative for dizziness and headaches.  Psychiatric/Behavioral: Negative for depression. The patient is not nervous/anxious.     DRUG ALLERGIES:   Allergies  Allergen Reactions   Bee Venom    Eggs Or Egg-Derived Products    Morphine And Related    VITALS:  Blood pressure (!) 155/84, pulse 80, temperature 97.7 F (36.5 C), temperature source Oral, resp. rate 19, height 6\' 2"  (1.88 m), weight (!) 140.6 kg, SpO2 96 %. PHYSICAL EXAMINATION:  Physical Exam  GENERAL:   Sitting up on the edge of the bed with no acute distress.  HEENT: Head atraumatic, normocephalic. Pupils equal, round, reactive to light and accommodation. No scleral icterus. Extraocular muscles intact. Oropharynx and nasopharynx clear.  NECK:  Supple, no jugular venous distention. No thyroid enlargement. LUNGS: Lungs are clear to auscultation bilaterally. No wheezes, crackles, rhonchi. No use of accessory muscles of respiration.  CARDIOVASCULAR: RRR, S1, S2 normal. No murmurs, rubs, or gallops.  ABDOMEN: Soft, nontender,  nondistended. Bowel sounds present.  EXTREMITIES: No pedal edema, cyanosis, or clubbing. +bilateral transmetatarsal amputations present. NEUROLOGIC: CN 2-12 intact, no focal deficits. 5/5 muscle strength throughout all extremities. Sensation intact throughout. Gait not checked.  PSYCHIATRIC: The patient is alert and oriented x 3.  SKIN: No obvious rash, lesion, or ulcer.  LABORATORY PANEL:  Male CBC Recent Labs  Lab 12/15/18 0705  WBC 6.0  HGB 12.6*  HCT 39.5  PLT 284   ------------------------------------------------------------------------------------------------------------------ Chemistries  Recent Labs  Lab 12/14/18 1925 12/15/18 0705  NA 132* 137  K 3.5 3.3*  CL 97* 103  CO2 26 25  GLUCOSE 408* 274*  BUN 22* 20  CREATININE 2.21* 2.09*  CALCIUM 8.0* 7.9*  AST 13*  --   ALT 12  --   ALKPHOS 171*  --   BILITOT 0.3  --    RADIOLOGY:  Ct Abdomen Pelvis W Contrast  Result Date: 12/14/2018 CLINICAL DATA:  58 year old male with acute abdominal and pelvic pain and vomiting. EXAM: CT ABDOMEN AND PELVIS WITH CONTRAST TECHNIQUE: Multidetector CT imaging of the abdomen and pelvis was performed using the standard protocol following bolus administration of intravenous contrast. CONTRAST:  130mL OMNIPAQUE IOHEXOL 300 MG/ML  SOLN COMPARISON:  04/08/2016 CT FINDINGS: Lower chest: No acute abnormality Hepatobiliary: The liver is unremarkable. The patient is status post cholecystectomy. No biliary dilatation. Pancreas: Unremarkable Spleen: Unremarkable Adrenals/Urinary Tract: The kidneys, adrenal glands and bladder are unremarkable. Stomach/Bowel: Stomach is within normal limits. Appendix appears normal. No evidence of bowel wall thickening, distention, or inflammatory changes. Vascular/Lymphatic: Aortic atherosclerotic calcifications noted without evidence of aneurysm. [place] bilateral inguinal and  pelvic lymph nodes are unchanged. No new or enlarging lymph nodes are identified.  Reproductive: Prostate is unremarkable. Other: A small RIGHT inguinal hernia containing fat is unchanged. There is no evidence of ascites, focal collection or pneumoperitoneum. Musculoskeletal: No acute or suspicious bony abnormality. IMPRESSION: 1. No evidence of acute abnormality. No findings to suggest a cause for this patient's abdominal pain. 2.  Aortic Atherosclerosis (ICD10-I70.0). Electronically Signed   By: Margarette Canada M.D.   On: 12/14/2018 20:34   Dg Chest Portable 1 View  Result Date: 12/14/2018 CLINICAL DATA:  Acute shortness of breath EXAM: PORTABLE CHEST 1 VIEW COMPARISON:  None FINDINGS: The cardiomediastinal silhouette is unremarkable. Mild peribronchial thickening noted. There is no evidence of focal airspace disease, pulmonary edema, suspicious pulmonary nodule/mass, pleural effusion, or pneumothorax. No acute bony abnormalities are identified. IMPRESSION: Mild peribronchial thickening of uncertain chronicity. Electronically Signed   By: Margarette Canada M.D.   On: 12/14/2018 19:12   ASSESSMENT AND PLAN:   Hematemesis- likely PUD vs gastritis. No known history of cirrhosis, so bleeding esophageal varices are unlikely. -GI consulted-plan for endoscopy today -Continue Protonix drip -NPO for now  Acute blood loss anemia-due to above. -Serial H/H  Acute renal failure- likely due to dehydration in the setting of vomiting.  Creatinine is improving. -Continue IV fluids -Holding home Lasix -Avoid nephrotoxic agents  Type 2 diabetes-blood sugars elevated this admission -Continue SSI  Hypertension- BP markedly elevated -Continue home Norvasc and metoprolol -Add IV hydralazine PRN  Hyperlipidemia-stable -Continue home Lipitor  All the records are reviewed and case discussed with Care Management/Social Worker. Management plans discussed with the patient, family and they are in agreement.  CODE STATUS: Full Code  TOTAL TIME TAKING CARE OF THIS PATIENT: 45 minutes.   More than  50% of the time was spent in counseling/coordination of care: YES  POSSIBLE D/C IN 1-2 DAYS, DEPENDING ON CLINICAL CONDITION.   Berna Spare Calel Pisarski M.D on 12/15/2018 at 12:41 PM  Between 7am to 6pm - Pager - [phone]  After 6pm go to [email or web address] - Proofreader  [facility] [place]  Office  [phone]  CC: Primary care physician; Patient, No Pcp Per  Note: This dictation was prepared with Dragon dictation along with smaller phrase technology. Any transcriptional errors that result from this process are unintentional.

## 2018-12-15 NOTE — ED Notes (Signed)
.. ED TO INPATIENT HANDOFF REPORT  ED Nurse Name and Phone #: Dorthula Rue, RN  S Name/Age/Gender Zachary Young 58 y.o. male Room/Bed: ED09A/ED09A  Code Status   Code Status: Full Code  Home/SNF/Other Home Patient oriented to: self, place, time and situation Is this baseline? Yes   Triage Complete: Triage complete  Chief Complaint Emesis  Triage Note Pt presents to ED c/o hematemesis since last night. Pt states x4 episodes. Denies diarrhea. C/o 10/10 mid abd pain. CBG 491 with EMS, hx DM. VS WNL.    Allergies Allergies  Allergen Reactions  . Bee Venom   . Eggs Or Egg-Derived Products   . Morphine And Related     Level of Care/Admitting Diagnosis ED Disposition    ED Disposition Condition [facility] Area: [place] [[id number]]  Level of Care: Med-Surg [16]  Covid Evaluation: Confirmed COVID Negative  Diagnosis: GI bleed [id number]  Admitting Physician: Vaughan Basta [id number]  Attending Physician: Vaughan Basta [phone]  Estimated length of stay: past midnight tomorrow  Certification:: I certify this patient will need inpatient services for at least 2 midnights  PT Class (Do Not Modify): Inpatient [101]  PT Acc Code (Do Not Modify): Private [1]       B Medical/Surgery History Past Medical History:  Diagnosis Date  . Diabetes mellitus without complication [facility])    Past Surgical History:  Procedure Laterality Date  . TOE AMPUTATION Right    due to osteomyelitis     A IV Location/Drains/Wounds Patient Lines/Drains/Airways Status   Active Line/Drains/Airways    Name:   Placement date:   Placement time:   Site:   Days:   Peripheral IV 12/14/18 Left;Upper Arm   12/14/18    1924    Arm   1          Intake/Output Last 24 hours  Intake/Output Summary (Last 24 hours) at 12/15/2018 0428 Last data filed at 12/14/2018 2102 Gross per 24 hour  Intake 100 ml  Output -  Net 100 ml     Labs/Imaging Results for orders placed or performed during the hospital encounter of 12/14/18 (from the past 48 hour(s))  CBC with Differential     Status: Abnormal   Collection Time: 12/14/18  7:25 PM  Result Value Ref Range   WBC 7.8 4.0 - 10.5 K/uL   RBC 4.86 4.22 - 5.81 MIL/uL   Hemoglobin 12.5 (L) 13.0 - 17.0 g/dL   HCT 38.8 (L) 39.0 - 52.0 %   MCV 79.8 (L) 80.0 - 100.0 fL   MCH 25.7 (L) 26.0 - 34.0 pg   MCHC 32.2 30.0 - 36.0 g/dL   RDW 16.0 (H) 11.5 - 15.5 %   Platelets 321 150 - 400 K/uL   nRBC 0.0 0.0 - 0.2 %   Neutrophils Relative % 63 %   Neutro Abs 5.0 1.7 - 7.7 K/uL   Lymphocytes Relative 27 %   Lymphs Abs 2.1 0.7 - 4.0 K/uL   Monocytes Relative 7 %   Monocytes Absolute 0.6 0.1 - 1.0 K/uL   Eosinophils Relative 2 %   Eosinophils Absolute 0.1 0.0 - 0.5 K/uL   Basophils Relative 1 %   Basophils Absolute 0.0 0.0 - 0.1 K/uL   Immature Granulocytes 0 %   Abs Immature Granulocytes 0.02 0.00 - 0.07 K/uL    Comment: Performed at [facility], [address], [place], [place] [address]  Comprehensive metabolic panel     Status: Abnormal  Collection Time: 12/14/18  7:25 PM  Result Value Ref Range   Sodium 132 (L) 135 - 145 mmol/L   Potassium 3.5 3.5 - 5.1 mmol/L   Chloride 97 (L) 98 - 111 mmol/L   CO2 26 22 - 32 mmol/L   Glucose, Bld 408 (H) 70 - 99 mg/dL   BUN 22 (H) 6 - 20 mg/dL   Creatinine, Ser 2.21 (H) 0.61 - 1.24 mg/dL   Calcium 8.0 (L) 8.9 - 10.3 mg/dL   Total Protein 7.1 6.5 - 8.1 g/dL   Albumin 2.5 (L) 3.5 - 5.0 g/dL   AST 13 (L) 15 - 41 U/L   ALT 12 0 - 44 U/L   Alkaline Phosphatase 171 (H) 38 - 126 U/L   Total Bilirubin 0.3 0.3 - 1.2 mg/dL   GFR calc non Af Amer 32 (L) >60 mL/min   GFR calc Af Amer 37 (L) >60 mL/min   Anion gap 9 5 - 15    Comment: Performed at [facility], [address] [place], [place] [address]  Protime-INR     Status: None   Collection Time: 12/14/18  7:25 PM  Result Value Ref Range   Prothrombin Time  13.2 11.4 - 15.2 seconds   INR 1.0 0.8 - 1.2    Comment: (NOTE) INR goal varies based on device and disease states. Performed at [facility], [address] [place], [place] [address]   APTT     Status: None   Collection Time: 12/14/18  7:25 PM  Result Value Ref Range   aPTT 31 24 - 36 seconds    Comment: Performed at [facility], [address] [place], [place] [address]  Type and screen [place]     Status: None   Collection Time: 12/14/18  7:25 PM  Result Value Ref Range   ABO/RH(D) B POS    Antibody Screen NEG    Sample Expiration      12/17/2018,2359 Performed at [facility], [place], [place] [address]   Troponin I (High Sensitivity)     Status: Abnormal   Collection Time: 12/14/18  7:25 PM  Result Value Ref Range   Troponin I (High Sensitivity) 21 (H) <18 ng/L    Comment: (NOTE) Elevated high sensitivity troponin I (hsTnI) values and significant  changes across serial measurements may suggest ACS but many other  chronic and acute conditions are known to elevate hsTnI results.  Refer to the "Links" section for chest pain algorithms and additional  guidance. Performed at [facility], [address] [place], [place] [address]   Hemoglobin A1c     Status: Abnormal   Collection Time: 12/14/18  7:25 PM  Result Value Ref Range   Hgb A1c MFr Bld 11.1 (H) 4.8 - 5.6 %    Comment: (NOTE) Pre diabetes:          5.7%-6.4% Diabetes:              >6.4% Glycemic control for   <7.0% adults with diabetes    Mean Plasma Glucose 271.87 mg/dL    Comment: Performed at [facility] [address] [place], [place] [address]  SARS Coronavirus 2 [facility] order, Performed in [facility] hospital lab) Nasopharyngeal Nasopharyngeal Swab     Status: None   Collection Time: 12/14/18  7:58 PM   Specimen: Nasopharyngeal Swab  Result Value Ref Range   SARS Coronavirus 2 NEGATIVE NEGATIVE  Comment:  (NOTE) If result is NEGATIVE SARS-CoV-2 target nucleic acids are NOT DETECTED. The SARS-CoV-2 RNA is generally detectable in upper and lower  respiratory specimens during the acute phase of infection. The lowest  concentration of SARS-CoV-2 viral copies this assay can detect is 250  copies / mL. A negative result does not preclude SARS-CoV-2 infection  and should not be used as the sole basis for treatment or other  patient management decisions.  A negative result may occur with  improper specimen collection / handling, submission of specimen other  than nasopharyngeal swab, presence of viral mutation(s) within the  areas targeted by this assay, and inadequate number of viral copies  (<250 copies / mL). A negative result must be combined with clinical  observations, patient history, and epidemiological information. If result is POSITIVE SARS-CoV-2 target nucleic acids are DETECTED. The SARS-CoV-2 RNA is generally detectable in upper and lower  respiratory specimens dur ing the acute phase of infection.  Positive  results are indicative of active infection with SARS-CoV-2.  Clinical  correlation with patient history and other diagnostic information is  necessary to determine patient infection status.  Positive results do  not rule out bacterial infection or co-infection with other viruses. If result is PRESUMPTIVE POSTIVE SARS-CoV-2 nucleic acids MAY BE PRESENT.   A presumptive positive result was obtained on the submitted specimen  and confirmed on repeat testing.  While 2019 novel coronavirus  (SARS-CoV-2) nucleic acids may be present in the submitted sample  additional confirmatory testing may be necessary for epidemiological  and / or clinical management purposes  to differentiate between  SARS-CoV-2 and other Sarbecovirus currently known to infect humans.  If clinically indicated additional testing with an alternate test  methodology [id number]) is advised. The SARS-CoV-2 RNA is  generally  detectable in upper and lower respiratory sp ecimens during the acute  phase of infection. The expected result is Negative. Fact Sheet for Patients:  StrictlyIdeas.no Fact Sheet for Healthcare Providers: BankingDealers.co.za This test is not yet approved or cleared by the [place] FDA and has been authorized for detection and/or diagnosis of SARS-CoV-2 by FDA under an Emergency Use Authorization (EUA).  This EUA will remain in effect (meaning this test can be used) for the duration of the COVID-19 declaration under Section 564(b)(1) of the Act, 21 U.S.C. section 360bbb-3(b)(1), unless the authorization is terminated or revoked sooner. Performed at [facility], [place], [place] [address]   Troponin I (High Sensitivity)     Status: Abnormal   Collection Time: 12/14/18  9:40 PM  Result Value Ref Range   Troponin I (High Sensitivity) 20 (H) <18 ng/L    Comment: (NOTE) Elevated high sensitivity troponin I (hsTnI) values and significant  changes across serial measurements may suggest ACS but many other  chronic and acute conditions are known to elevate hsTnI results.  Refer to the "Links" section for chest pain algorithms and additional  guidance. Performed at [facility], [place], [place] [address]   Glucose, capillary     Status: Abnormal   Collection Time: 12/14/18 10:23 PM  Result Value Ref Range   Glucose-Capillary 372 (H) 70 - 99 mg/dL   Ct Abdomen Pelvis W Contrast  Result Date: 12/14/2018 CLINICAL DATA:  58 year old male with acute abdominal and pelvic pain and vomiting. EXAM: CT ABDOMEN AND PELVIS WITH CONTRAST TECHNIQUE: Multidetector CT imaging of the abdomen and pelvis was performed using the standard protocol following bolus administration of intravenous  contrast. CONTRAST:  146mL OMNIPAQUE IOHEXOL 300 MG/ML  SOLN COMPARISON:  04/08/2016 CT FINDINGS: Lower  chest: No acute abnormality Hepatobiliary: The liver is unremarkable. The patient is status post cholecystectomy. No biliary dilatation. Pancreas: Unremarkable Spleen: Unremarkable Adrenals/Urinary Tract: The kidneys, adrenal glands and bladder are unremarkable. Stomach/Bowel: Stomach is within normal limits. Appendix appears normal. No evidence of bowel wall thickening, distention, or inflammatory changes. Vascular/Lymphatic: Aortic atherosclerotic calcifications noted without evidence of aneurysm. Shotty bilateral inguinal and pelvic lymph nodes are unchanged. No new or enlarging lymph nodes are identified. Reproductive: Prostate is unremarkable. Other: A small RIGHT inguinal hernia containing fat is unchanged. There is no evidence of ascites, focal collection or pneumoperitoneum. Musculoskeletal: No acute or suspicious bony abnormality. IMPRESSION: 1. No evidence of acute abnormality. No findings to suggest a cause for this patient's abdominal pain. 2.  Aortic Atherosclerosis (ICD10-I70.0). Electronically Signed   By: Margarette Canada M.D.   On: 12/14/2018 20:34   Dg Chest Portable 1 View  Result Date: 12/14/2018 CLINICAL DATA:  Acute shortness of breath EXAM: PORTABLE CHEST 1 VIEW COMPARISON:  None FINDINGS: The cardiomediastinal silhouette is unremarkable. Mild peribronchial thickening noted. There is no evidence of focal airspace disease, pulmonary edema, suspicious pulmonary nodule/mass, pleural effusion, or pneumothorax. No acute bony abnormalities are identified. IMPRESSION: Mild peribronchial thickening of uncertain chronicity. Electronically Signed   By: Margarette Canada M.D.   On: 12/14/2018 19:12    Pending Labs Unresulted Labs (From admission, onward)    Start     Ordered   12/15/18 [id number]  Basic metabolic panel  Tomorrow morning,   STAT     12/14/18 2306   12/15/18 0500  CBC  Tomorrow morning,   STAT     12/14/18 2306   12/15/18 0500  Protime-INR  Tomorrow morning,   STAT     12/14/18 2306    12/15/18 0000  Hemoglobin and hematocrit, blood  Now then every 6 hours,   STAT     12/14/18 2306   12/14/18 2307  HIV antibody (Routine Testing)  Once,   STAT     12/14/18 2306   12/14/18 1834  Urinalysis, Routine w reflex microscopic  Once,   STAT     12/14/18 1833          Vitals/Pain Today's Vitals   12/15/18 0200 12/15/18 0230 12/15/18 0300 12/15/18 0330  BP: (!) 149/81 131/79 (!) 150/80 (!) 150/96  Pulse:  67 67   Resp: 13 (!) 9 10 11   Temp:      TempSrc:      SpO2:  94% 100%   Weight:      Height:      PainSc:        Isolation Precautions No active isolations  Medications Medications  amLODipine (NORVASC) tablet 10 mg (has no administration in time range)  atorvastatin (LIPITOR) tablet 80 mg (has no administration in time range)  furosemide (LASIX) tablet 80 mg (has no administration in time range)  gabapentin (NEURONTIN) capsule 400 mg (has no administration in time range)  insulin glargine (LANTUS) injection 10 Units (has no administration in time range)  metoprolol succinate (TOPROL-XL) 24 hr tablet 100 mg (has no administration in time range)  sodium chloride flush (NS) 0.9 % injection 3 mL (has no administration in time range)  pantoprazole (PROTONIX) 80 mg in sodium chloride 0.9 % 250 mL (0.32 mg/mL) infusion (8 mg/hr Intravenous New Bag/Given 12/15/18 0133)  pantoprazole (PROTONIX) injection 40 mg (has no administration in time range)  0.9 %  sodium chloride infusion ( Intravenous New Bag/Given 12/15/18 0128)  ondansetron (ZOFRAN) tablet 4 mg (has no administration in time range)    Or  ondansetron (ZOFRAN) injection 4 mg (has no administration in time range)  insulin aspart (novoLOG) injection 0-15 Units ( Subcutaneous Not Given 12/14/18 2249)  insulin aspart (novoLOG) injection 0-5 Units (5 Units Subcutaneous Given 12/14/18 2228)  HYDROmorphone (DILAUDID) injection 0.5 mg (0.5 mg Intravenous Given 12/15/18 0130)  HYDROmorphone (DILAUDID) 1 MG/ML injection (   Not Given 12/15/18 0128)  metoCLOPramide (REGLAN) injection 10 mg (10 mg Intravenous Given 12/14/18 1926)  ondansetron (ZOFRAN) injection 4 mg (4 mg Intravenous Given 12/14/18 1925)  fentaNYL (SUBLIMAZE) injection 100 mcg (100 mcg Intravenous Given 12/14/18 1926)  pantoprazole (PROTONIX) 80 mg in sodium chloride 0.9 % 100 mL IVPB (0 mg Intravenous Stopped 12/14/18 2102)  iohexol (OMNIPAQUE) 300 MG/ML solution 100 mL (100 mLs Intravenous Contrast Given 12/14/18 2013)  HYDROmorphone (DILAUDID) injection 1 mg (1 mg Intravenous Given 12/14/18 2150)    Mobility walks Low fall risk   Focused Assessments Cardiac Assessment Handoff:  Cardiac Rhythm: Normal sinus rhythm No results found for: CKTOTAL, CKMB, CKMBINDEX, TROPONINI No results found for: DDIMER Does the Patient currently have chest pain? No      R Recommendations: See Admitting Provider Note  Report given to:   Additional Notes:  On protonix drip currently; wraps to both legs-peripheral edema which pt says has increased; taking medications, including lasix, as prescribed;

## 2018-12-15 NOTE — Consult Note (Signed)
[place] Nurse wound consult note Reason for Consult: Dehiscence of transmetatarsal amputation.  Patient refusing care, stating he is here about his stomach.  I informed him that myself and the nursing staff need to provide wound assessment and care for his nonhealed wounds to his feet.  He refuses to let me look or touch.  He states "they are getting better"  I inquired if he would like the bandages changed while here and he refused once again.  I informed bedside RN of patients refusal for WOC assessment.  Wound type:Nonhealing surgical site.  Pressure Injury POA: /NA Measurement:refused assessment and care Will not follow at this time.  Please re-consult if needed.  Domenic Moras MSN, RN, FNP-BC CWON Wound, Ostomy, Continence Nurse Pager [phone]

## 2018-12-15 NOTE — Discharge Summary (Signed)
Patient left AMA due to inability to eat prior to his endoscopy. Patient states that he is not treated this way at other hospitals. I explained that I had discussed his case with the GI doctor and that his endoscopy would be done today and that he could eat immediately afterwards. Patient stated that he did not want to stay at this hospital anymore.  Hyman Bible, MD

## 2018-12-15 NOTE — ED Notes (Signed)
Telemetry monitor noted to show pt had unhooked leads; no 0500 blood pressure taken; in to check on pt; pt says he is very uncomfortable in the bed, unsure when pt was going to get a bed; was standing outside pt's door making a hospital bed for him when charge nurse called to say pt had a bed assigned; pt informed of same; will leave on ED stretcher until moved to the floor;

## 2018-12-16 LAB — HIV ANTIBODY (ROUTINE TESTING W REFLEX): HIV Screen 4th Generation wRfx: NONREACTIVE

## 2019-05-15 ENCOUNTER — Emergency Department
Admission: EM | Admit: 2019-05-15 | Discharge: 2019-05-15 | Disposition: A | Discharge status: Home / Self Care | Payer: Medicaid Other | Attending: Emergency Medicine | Admitting: Emergency Medicine

## 2019-05-15 ENCOUNTER — Emergency Department: Payer: Medicaid Other

## 2019-05-15 ENCOUNTER — Other Ambulatory Visit: Payer: Self-pay

## 2019-05-15 DIAGNOSIS — Z794 Long term (current) use of insulin: Secondary | ICD-10-CM | POA: Diagnosis not present

## 2019-05-15 DIAGNOSIS — F17210 Nicotine dependence, cigarettes, uncomplicated: Secondary | ICD-10-CM | POA: Diagnosis not present

## 2019-05-15 DIAGNOSIS — I110 Hypertensive heart disease with heart failure: Secondary | ICD-10-CM | POA: Insufficient documentation

## 2019-05-15 DIAGNOSIS — I5023 Acute on chronic systolic (congestive) heart failure: Secondary | ICD-10-CM | POA: Diagnosis not present

## 2019-05-15 DIAGNOSIS — E119 Type 2 diabetes mellitus without complications: Secondary | ICD-10-CM | POA: Diagnosis not present

## 2019-05-15 DIAGNOSIS — M7989 Other specified soft tissue disorders: Secondary | ICD-10-CM | POA: Diagnosis present

## 2019-05-15 DIAGNOSIS — Z79899 Other long term (current) drug therapy: Secondary | ICD-10-CM | POA: Diagnosis not present

## 2019-05-15 MED ORDER — FUROSEMIDE 10 MG/ML IJ SOLN: 80.0000 mg | Freq: Once | INTRAMUSCULAR | Status: DC

## 2019-05-15 NOTE — ED Notes (Signed)
Dr. Jimmye Norman at bedside talking to pt, pt informed Dr. Jimmye Norman he wants to leave and is calling his brother to pick him up. Pt leaving AMA

## 2019-05-15 NOTE — ED Notes (Signed)
Pt states he had labs drawn at [facility] today

## 2019-05-15 NOTE — ED Notes (Signed)
Pt refused POC SARS test.

## 2019-05-15 NOTE — ED Notes (Signed)
Pt refused to sign AMA form. RN made a note of signature pad

## 2019-05-15 NOTE — ED Provider Notes (Signed)
Patient is refusing IV access or any further treatment.  He is leaving [facility].  He states he will go to [facility].   Earleen Newport, MD 05/15/19 2016

## 2019-05-15 NOTE — ED Provider Notes (Signed)
[facility] Emergency Department Provider Note  ____________________________________________   First MD Initiated Contact with Patient 05/15/19 1901     (approximate)  I have reviewed the triage vital signs and the nursing notes.   HISTORY  Chief Complaint Leg Swelling    HPI Zachary Young is a 59 y.o. male  With h/o CHF, DM, obesity, GI bleed/PUD, here with multiple complaints. Primary complaint is progressively worsening leg swelling, abdominal swelling, and abd distension. Reports he feels like he has "some fluid on" him, with associated worsening DOE. No cough. He has been taking his meds, including lasix, as prescribed. Does not weigh himself regularly. No fevers, chills, sputum production.  Pt also reports that he has occasionally "thrown up some blood." This seems to be an intermittent, ongoing issue for which he has been admitted previously. He is on antacids for this. Denies any significant worsening of this, no melena, no lightheadedness. No other complaints.     Of note, pt also states that while walking in hsi house earlier, he felt so weak that he "collapsed." No LOC but just fell to the ground. EMS called. Denies head injury.   Past Medical History:  Diagnosis Date  . Diabetes mellitus without complication [facility])     Patient Active Problem List   Diagnosis Date Noted  . GI bleed 12/14/2018    Past Surgical History:  Procedure Laterality Date  . TOE AMPUTATION Right    due to osteomyelitis    Prior to Admission medications   Medication Sig Start Date End Date Taking? Authorizing Provider  amLODipine (NORVASC) 10 MG tablet Take 10 mg by mouth daily.  08/04/18   [provider]  atorvastatin (LIPITOR) 80 MG tablet Take 80 mg by mouth daily at 6 PM.  08/04/18   [provider]  furosemide (LASIX) 80 MG tablet Take 80 mg by mouth daily.  12/03/18 03/03/19  [provider]  gabapentin (NEURONTIN) 400 MG capsule  Take 400 mg by mouth at bedtime.  08/15/18   [provider]  insulin glargine (LANTUS) 100 UNIT/ML injection Inject 10 Units into the skin at bedtime.     [provider]  insulin lispro (HUMALOG) 100 UNIT/ML injection Inject into the skin 3 (three) times daily with meals.  12/03/18 01/02/19  [provider]  metoprolol succinate (TOPROL-XL) 100 MG 24 hr tablet Take 100 mg by mouth daily.  12/04/18 01/03/19  [provider]  PRESCRIPTION MEDICATION Take 1 tablet by mouth daily.    [provider]    Allergies Bee venom, Eggs or egg-derived products, and Morphine and related  History reviewed. No pertinent family history.  Social History Social History   Tobacco Use  . Smoking status: Current Every Day Smoker    Packs/day: 1.00    Types: Cigarettes  . Smokeless tobacco: Never Used  Substance Use Topics  . Alcohol use: Not Currently  . Drug use: Never    Review of Systems  Review of Systems  Constitutional: Positive for fatigue. Negative for chills and fever.  HENT: Negative for sore throat.   Respiratory: Positive for cough. Negative for shortness of breath.   Cardiovascular: Positive for leg swelling. Negative for chest pain.  Gastrointestinal: Negative for abdominal pain.  Genitourinary: Negative for flank pain.  Musculoskeletal: Negative for neck pain.  Skin: Negative for rash and wound.  Allergic/Immunologic: Negative for immunocompromised state.  Neurological: Positive for weakness. Negative for numbness.  Hematological: Does not bruise/bleed easily.  All other  systems reviewed and are negative.    ____________________________________________  PHYSICAL EXAM:      VITAL SIGNS: ED Triage Vitals  Enc Vitals Group     BP 05/15/19 1852 (!) 186/89     Pulse Rate 05/15/19 1852 89     Resp 05/15/19 1852 18     Temp 05/15/19 1852 97.8 F (36.6 C)     Temp Source 05/15/19 1852 Oral     SpO2 05/15/19 1852 99 %     Weight 05/15/19  1849 (!) 310 lb (140.6 kg)     Height 05/15/19 1849 6\' 3"  (1.905 m)     Head Circumference --      Peak Flow --      Pain Score 05/15/19 1849 10     Pain Loc --      Pain Edu? --      Excl. in [place]? --      Physical Exam Vitals and nursing note reviewed.  Constitutional:      General: He is not in acute distress.    Appearance: He is well-developed.  HENT:     Head: Normocephalic and atraumatic.  Eyes:     Conjunctiva/sclera: Conjunctivae normal.  Neck:     Comments: +JVD Cardiovascular:     Rate and Rhythm: Regular rhythm.     Heart sounds: Normal heart sounds.  Pulmonary:     Effort: Pulmonary effort is normal. No respiratory distress.     Breath sounds: Rales (bibasilar) present. No wheezing.  Abdominal:     General: There is no distension.  Musculoskeletal:     Cervical back: Neck supple.     Right lower leg: Edema present.     Left lower leg: Edema present.     Comments: 3+ pitting edema bl LE, with secondary skin thickening and lymphedema  Skin:    General: Skin is warm.     Capillary Refill: Capillary refill takes less than 2 seconds.     Findings: No rash.  Neurological:     Mental Status: He is alert and oriented to person, place, and time.     Motor: No abnormal muscle tone.       ____________________________________________   LABS (all labs ordered are listed, but only abnormal results are displayed)  Labs Reviewed  CBC WITH DIFFERENTIAL/PLATELET  COMPREHENSIVE METABOLIC PANEL  BRAIN NATRIURETIC PEPTIDE  TROPONIN I (HIGH SENSITIVITY)    ____________________________________________  EKG: Normal sinus rhythm, VR 89. QRS 96, QTc 491. No acute St elevations or depressions. ________________________________________  RADIOLOGY All imaging, including plain films, CT scans, and ultrasounds, independently reviewed by me, and interpretations confirmed via formal radiology reads.  ED MD interpretation:   CXR: Reviewed by me, c/w mild CHF  Official  radiology report(s): No results found.  ____________________________________________  PROCEDURES   Procedure(s) performed (including Critical Care):  Procedures  ____________________________________________  INITIAL IMPRESSION / MDM / [place] / ED COURSE  As part of my medical decision making, I reviewed the following data within the [facility] notes reviewed and incorporated, Old chart reviewed, Notes from prior ED visits, and [place] Controlled Substance Database       *Deontay Deignan was evaluated in Emergency Department on 05/15/2019 for the symptoms described in the history of present illness. He was evaluated in the context of the global COVID-19 pandemic, which necessitated consideration that the patient might be at risk for infection with the SARS-CoV-2 virus that causes COVID-19. Institutional protocols and algorithms that pertain  to the evaluation of patients at risk for COVID-19 are in a state of rapid change based on information released by regulatory bodies including the CDC and federal and state organizations. These policies and algorithms were followed during the patient's care in the ED.  Some ED evaluations and interventions may be delayed as a result of limited staffing during the pandemic.*     Medical Decision Making:  59 yo M here with multiple complaints. Primary complaint seems to be worsening LE edema, DOE, and weakness causing him to fall today. Suspect CHF. He is not hypoxic but suspect he is significantly fluid overloaded. Labs pending, will give a dose of IV lasix. Otherwise, re: fall - no signs of trauma, no LOC. No injuries noted. This sounds like it was more so 2/2 weakness/deconditioning.  Pt also reported occasional bloody emesis. This has been a longstanding issue for him per report. Will re-check a CBC - last Hgb around 11. If stable, doubt significant UGIB and this can be followed up as an  outpt.    ____________________________________________  FINAL CLINICAL IMPRESSION(S) / ED DIAGNOSES  Final diagnoses:  Acute on chronic systolic congestive heart failure ([place])     MEDICATIONS GIVEN DURING THIS VISIT:  Medications - No data to display   ED Discharge Orders    None       Note:  This document was prepared using Dragon voice recognition software and may include unintentional dictation errors.   Duffy Bruce, MD 05/15/19 [phone]

## 2019-05-15 NOTE — ED Notes (Signed)
IV access attempted x 2 by this RN and by student RN without success

## 2019-05-15 NOTE — ED Triage Notes (Signed)
Pt arrives via [facility] from home for fluid retention x 4 days, and a fall that happened before EMS arrived. Pt reports he was on the porch and "my legs gave out". Pt reports he hit his head but did not lose consciousness. Pt A&Ox4 and in NAD.

## 2019-05-26 DIAGNOSIS — K2970 Gastritis, unspecified, without bleeding: Secondary | ICD-10-CM | POA: Insufficient documentation

## 2019-06-11 ENCOUNTER — Other Ambulatory Visit: Payer: Self-pay

## 2019-06-11 ENCOUNTER — Encounter: Payer: Self-pay | Admitting: Emergency Medicine

## 2019-06-11 ENCOUNTER — Emergency Department
Admission: EM | Admit: 2019-06-11 | Discharge: 2019-06-11 | Disposition: A | Discharge status: Home / Self Care | Payer: Medicaid Other | Attending: Emergency Medicine | Admitting: Emergency Medicine

## 2019-06-11 DIAGNOSIS — Z5321 Procedure and treatment not carried out due to patient leaving prior to being seen by health care provider: Secondary | ICD-10-CM | POA: Insufficient documentation

## 2019-06-11 DIAGNOSIS — R739 Hyperglycemia, unspecified: Secondary | ICD-10-CM | POA: Insufficient documentation

## 2019-06-11 HISTORY — DX: Edema, unspecified: R60.9

## 2019-06-11 HISTORY — DX: Localized edema: R60.0

## 2019-06-11 LAB — GLUCOSE, CAPILLARY: Glucose-Capillary: 397 mg/dL — ABNORMAL HIGH (ref 70–99)

## 2019-06-11 NOTE — ED Triage Notes (Signed)
First nurse note: Arrived by EMS from home. Patient c/o hyperglycemia. Patient took 6 units lantus before EMS arrived. Also c/o blood in urine for a few days. 133/71 b/p, 97%, HR 72, temp 98.1oral, 99% RA

## 2019-06-11 NOTE — ED Notes (Signed)
Patient no longer outside when this RN checked.

## 2019-06-11 NOTE — ED Triage Notes (Signed)
Pt brought in by ems from home for hyperglycemia and per pt has all over swelling.

## 2019-06-11 NOTE — ED Notes (Addendum)
Pt requesting to be transferred to [place]. Pt was brought in ems to closest hospital ([place]) but normally goes there. Advised we can't transfer, that he has to see a doctor and they could. bs down from 600 to 397. Pt does not want me to draw blood. States he isnt staying and will call a ride. Advised pt to let me know if he decides to stay. Pt states the edema is chronic from his legs up and that he is on lasix.

## 2019-06-11 NOTE — ED Notes (Signed)
Patient walked outside to smoke cigarette. Staff walked outside to tell patient he had ready room and he insisted on finishing his cigarette. Will take patient to room when he comes back inside

## 2019-06-17 DIAGNOSIS — R601 Generalized edema: Secondary | ICD-10-CM | POA: Diagnosis present

## 2019-08-15 ENCOUNTER — Emergency Department (HOSPITAL_COMMUNITY): Payer: Medicaid Other

## 2019-08-15 ENCOUNTER — Inpatient Hospital Stay (HOSPITAL_COMMUNITY)
Admission: EM | Admit: 2019-08-15 | Discharge: 2019-08-20 | DRG: 291 | Disposition: A | Discharge status: Home / Self Care | Payer: Medicaid Other | Attending: Family Medicine | Admitting: Family Medicine

## 2019-08-15 ENCOUNTER — Inpatient Hospital Stay (HOSPITAL_COMMUNITY): Payer: Medicaid Other

## 2019-08-15 ENCOUNTER — Other Ambulatory Visit: Payer: Self-pay

## 2019-08-15 ENCOUNTER — Encounter (HOSPITAL_COMMUNITY): Payer: Self-pay | Admitting: *Deleted

## 2019-08-15 DIAGNOSIS — Z20822 Contact with and (suspected) exposure to covid-19: Secondary | ICD-10-CM | POA: Diagnosis present

## 2019-08-15 DIAGNOSIS — Z885 Allergy status to narcotic agent status: Secondary | ICD-10-CM | POA: Diagnosis not present

## 2019-08-15 DIAGNOSIS — I130 Hypertensive heart and chronic kidney disease with heart failure and stage 1 through stage 4 chronic kidney disease, or unspecified chronic kidney disease: Principal | ICD-10-CM | POA: Diagnosis present

## 2019-08-15 DIAGNOSIS — I110 Hypertensive heart disease with heart failure: Secondary | ICD-10-CM | POA: Diagnosis not present

## 2019-08-15 DIAGNOSIS — Z9981 Dependence on supplemental oxygen: Secondary | ICD-10-CM

## 2019-08-15 DIAGNOSIS — I5042 Chronic combined systolic (congestive) and diastolic (congestive) heart failure: Secondary | ICD-10-CM | POA: Diagnosis present

## 2019-08-15 DIAGNOSIS — N184 Chronic kidney disease, stage 4 (severe): Secondary | ICD-10-CM | POA: Diagnosis present

## 2019-08-15 DIAGNOSIS — R0603 Acute respiratory distress: Secondary | ICD-10-CM | POA: Diagnosis not present

## 2019-08-15 DIAGNOSIS — I252 Old myocardial infarction: Secondary | ICD-10-CM | POA: Diagnosis not present

## 2019-08-15 DIAGNOSIS — Z9109 Other allergy status, other than to drugs and biological substances: Secondary | ICD-10-CM | POA: Diagnosis not present

## 2019-08-15 DIAGNOSIS — I509 Heart failure, unspecified: Secondary | ICD-10-CM

## 2019-08-15 DIAGNOSIS — G8929 Other chronic pain: Secondary | ICD-10-CM | POA: Diagnosis present

## 2019-08-15 DIAGNOSIS — R109 Unspecified abdominal pain: Secondary | ICD-10-CM | POA: Diagnosis present

## 2019-08-15 DIAGNOSIS — R05 Cough: Secondary | ICD-10-CM | POA: Diagnosis present

## 2019-08-15 DIAGNOSIS — R0600 Dyspnea, unspecified: Secondary | ICD-10-CM

## 2019-08-15 DIAGNOSIS — K922 Gastrointestinal hemorrhage, unspecified: Secondary | ICD-10-CM | POA: Diagnosis present

## 2019-08-15 DIAGNOSIS — E119 Type 2 diabetes mellitus without complications: Secondary | ICD-10-CM | POA: Diagnosis not present

## 2019-08-15 DIAGNOSIS — F17210 Nicotine dependence, cigarettes, uncomplicated: Secondary | ICD-10-CM | POA: Diagnosis present

## 2019-08-15 DIAGNOSIS — Z590 Homelessness: Secondary | ICD-10-CM

## 2019-08-15 DIAGNOSIS — E1122 Type 2 diabetes mellitus with diabetic chronic kidney disease: Secondary | ICD-10-CM | POA: Diagnosis present

## 2019-08-15 DIAGNOSIS — I5021 Acute systolic (congestive) heart failure: Secondary | ICD-10-CM | POA: Diagnosis not present

## 2019-08-15 DIAGNOSIS — Z89421 Acquired absence of other right toe(s): Secondary | ICD-10-CM | POA: Diagnosis not present

## 2019-08-15 DIAGNOSIS — E785 Hyperlipidemia, unspecified: Secondary | ICD-10-CM | POA: Diagnosis present

## 2019-08-15 DIAGNOSIS — Z79899 Other long term (current) drug therapy: Secondary | ICD-10-CM | POA: Diagnosis not present

## 2019-08-15 DIAGNOSIS — R1032 Left lower quadrant pain: Secondary | ICD-10-CM | POA: Diagnosis not present

## 2019-08-15 DIAGNOSIS — I2510 Atherosclerotic heart disease of native coronary artery without angina pectoris: Secondary | ICD-10-CM | POA: Diagnosis present

## 2019-08-15 DIAGNOSIS — E1165 Type 2 diabetes mellitus with hyperglycemia: Secondary | ICD-10-CM | POA: Diagnosis present

## 2019-08-15 DIAGNOSIS — R1030 Lower abdominal pain, unspecified: Secondary | ICD-10-CM | POA: Diagnosis not present

## 2019-08-15 DIAGNOSIS — Z6841 Body Mass Index (BMI) 40.0 and over, adult: Secondary | ICD-10-CM

## 2019-08-15 DIAGNOSIS — I5043 Acute on chronic combined systolic (congestive) and diastolic (congestive) heart failure: Secondary | ICD-10-CM | POA: Diagnosis present

## 2019-08-15 DIAGNOSIS — J9601 Acute respiratory failure with hypoxia: Secondary | ICD-10-CM | POA: Diagnosis present

## 2019-08-15 DIAGNOSIS — R079 Chest pain, unspecified: Secondary | ICD-10-CM | POA: Diagnosis not present

## 2019-08-15 DIAGNOSIS — R601 Generalized edema: Secondary | ICD-10-CM | POA: Diagnosis not present

## 2019-08-15 DIAGNOSIS — R0602 Shortness of breath: Secondary | ICD-10-CM | POA: Diagnosis present

## 2019-08-15 DIAGNOSIS — N5089 Other specified disorders of the male genital organs: Secondary | ICD-10-CM | POA: Diagnosis present

## 2019-08-15 DIAGNOSIS — N189 Chronic kidney disease, unspecified: Secondary | ICD-10-CM

## 2019-08-15 DIAGNOSIS — Z91012 Allergy to eggs: Secondary | ICD-10-CM | POA: Diagnosis not present

## 2019-08-15 DIAGNOSIS — R0789 Other chest pain: Secondary | ICD-10-CM | POA: Diagnosis not present

## 2019-08-15 DIAGNOSIS — Z794 Long term (current) use of insulin: Secondary | ICD-10-CM | POA: Diagnosis not present

## 2019-08-15 DIAGNOSIS — R040 Epistaxis: Secondary | ICD-10-CM | POA: Diagnosis not present

## 2019-08-15 DIAGNOSIS — Z91030 Bee allergy status: Secondary | ICD-10-CM

## 2019-08-15 DIAGNOSIS — D631 Anemia in chronic kidney disease: Secondary | ICD-10-CM | POA: Diagnosis present

## 2019-08-15 HISTORY — DX: Disorder of kidney and ureter, unspecified: N28.9

## 2019-08-15 HISTORY — DX: Heart failure, unspecified: I50.9

## 2019-08-15 LAB — URINALYSIS, ROUTINE W REFLEX MICROSCOPIC
Bilirubin Urine: NEGATIVE
Glucose, UA: NEGATIVE mg/dL
Ketones, ur: NEGATIVE mg/dL
Leukocytes,Ua: NEGATIVE
Nitrite: NEGATIVE
Protein, ur: >=300 mg/dL — AB
Specific Gravity, Urine: 1.01 (ref 1.005–1.030)
pH: 5 (ref 5.0–8.0)

## 2019-08-15 LAB — CBC WITH DIFFERENTIAL/PLATELET
Abs Immature Granulocytes: 0.04 10*3/uL (ref 0.00–0.07)
Basophils Absolute: 0 10*3/uL (ref 0.0–0.1)
Basophils Relative: 0 %
Eosinophils Absolute: 0.1 10*3/uL (ref 0.0–0.5)
Eosinophils Relative: 1 %
HCT: 33.7 % — ABNORMAL LOW (ref 39.0–52.0)
Hemoglobin: 10.5 g/dL — ABNORMAL LOW (ref 13.0–17.0)
Immature Granulocytes: 0 %
Lymphocytes Relative: 14 %
Lymphs Abs: 1.4 10*3/uL (ref 0.7–4.0)
MCH: 28.6 pg (ref 26.0–34.0)
MCHC: 31.2 g/dL (ref 30.0–36.0)
MCV: 91.8 fL (ref 80.0–100.0)
Monocytes Absolute: 0.6 10*3/uL (ref 0.1–1.0)
Monocytes Relative: 6 %
Neutro Abs: 8 10*3/uL — ABNORMAL HIGH (ref 1.7–7.7)
Neutrophils Relative %: 79 %
Platelets: 380 10*3/uL (ref 150–400)
RBC: 3.67 MIL/uL — ABNORMAL LOW (ref 4.22–5.81)
RDW: 16.8 % — ABNORMAL HIGH (ref 11.5–15.5)
WBC: 10.2 10*3/uL (ref 4.0–10.5)
nRBC: 0 % (ref 0.0–0.2)

## 2019-08-15 LAB — TROPONIN I (HIGH SENSITIVITY)
Troponin I (High Sensitivity): 25 ng/L — ABNORMAL HIGH (ref <18)
Troponin I (High Sensitivity): 20 ng/L — ABNORMAL HIGH (ref <18)

## 2019-08-15 LAB — COMPREHENSIVE METABOLIC PANEL
ALT: 14 U/L (ref 0–44)
AST: 18 U/L (ref 15–41)
Albumin: 2.4 g/dL — ABNORMAL LOW (ref 3.5–5.0)
Alkaline Phosphatase: 106 U/L (ref 38–126)
Anion gap: 14 (ref 5–15)
BUN: 37 mg/dL — ABNORMAL HIGH (ref 6–20)
CO2: 16 mmol/L — ABNORMAL LOW (ref 22–32)
Calcium: 8.2 mg/dL — ABNORMAL LOW (ref 8.9–10.3)
Chloride: 110 mmol/L (ref 98–111)
Creatinine, Ser: 4.09 mg/dL — ABNORMAL HIGH (ref 0.61–1.24)
GFR calc Af Amer: 18 mL/min — ABNORMAL LOW (ref >60)
GFR calc non Af Amer: 15 mL/min — ABNORMAL LOW (ref >60)
Glucose, Bld: 78 mg/dL (ref 70–99)
Potassium: 4.3 mmol/L (ref 3.5–5.1)
Sodium: 140 mmol/L (ref 135–145)
Total Bilirubin: 1.2 mg/dL (ref 0.3–1.2)
Total Protein: 7.5 g/dL (ref 6.5–8.1)

## 2019-08-15 LAB — BRAIN NATRIURETIC PEPTIDE: B Natriuretic Peptide: 148.2 pg/mL — ABNORMAL HIGH (ref 0.0–100.0)

## 2019-08-15 LAB — GLUCOSE, CAPILLARY
Glucose-Capillary: 82 mg/dL (ref 70–99)
Glucose-Capillary: 134 mg/dL — ABNORMAL HIGH (ref 70–99)

## 2019-08-15 LAB — MAGNESIUM: Magnesium: 1.8 mg/dL (ref 1.7–2.4)

## 2019-08-15 LAB — RESPIRATORY PANEL BY RT PCR (FLU A&B, COVID)
Influenza A by PCR: NEGATIVE
Influenza B by PCR: NEGATIVE
SARS Coronavirus 2 by RT PCR: NEGATIVE

## 2019-08-15 LAB — PHOSPHORUS: Phosphorus: 5.1 mg/dL — ABNORMAL HIGH (ref 2.5–4.6)

## 2019-08-15 MED ORDER — ENOXAPARIN SODIUM 30 MG/0.3ML ~~LOC~~ SOLN
30.0000 mg | SUBCUTANEOUS | Status: DC
  Administered 2019-08-15 – 2019-08-16 (×2): 30 mg via SUBCUTANEOUS
  Filled 2019-08-15 (×2): qty 0.3

## 2019-08-15 MED ORDER — TORSEMIDE 20 MG PO TABS
60.0000 mg | ORAL_TABLET | Freq: Once | ORAL | Status: AC
  Administered 2019-08-15: 60 mg via ORAL
  Filled 2019-08-15: qty 3

## 2019-08-15 MED ORDER — SODIUM CHLORIDE 0.9% FLUSH
3.0000 mL | Freq: Once | INTRAVENOUS | Status: AC
  Administered 2019-08-15: 13:00:00 3 mL via INTRAVENOUS

## 2019-08-15 MED ORDER — POTASSIUM CHLORIDE CRYS ER 20 MEQ PO TBCR
20.0000 meq | EXTENDED_RELEASE_TABLET | Freq: Once | ORAL | Status: AC
  Administered 2019-08-15: 20 meq via ORAL
  Filled 2019-08-15: qty 1

## 2019-08-15 MED ORDER — GABAPENTIN 300 MG PO CAPS
600.0000 mg | ORAL_CAPSULE | Freq: Every day | ORAL | Status: DC
  Administered 2019-08-15 – 2019-08-19 (×5): 600 mg via ORAL
  Filled 2019-08-15 (×5): qty 2

## 2019-08-15 MED ORDER — ALBUTEROL SULFATE (2.5 MG/3ML) 0.083% IN NEBU: 2.5000 mg | INHALATION_SOLUTION | Freq: Once | RESPIRATORY_TRACT | Status: DC

## 2019-08-15 MED ORDER — INSULIN ASPART 100 UNIT/ML ~~LOC~~ SOLN
0.0000 [IU] | Freq: Three times a day (TID) | SUBCUTANEOUS | Status: DC
  Administered 2019-08-16: 2 [IU] via SUBCUTANEOUS
  Administered 2019-08-16: 1 [IU] via SUBCUTANEOUS
  Administered 2019-08-17: 2 [IU] via SUBCUTANEOUS
  Administered 2019-08-17 (×2): 1 [IU] via SUBCUTANEOUS
  Administered 2019-08-18 (×3): 3 [IU] via SUBCUTANEOUS
  Administered 2019-08-19: 2 [IU] via SUBCUTANEOUS
  Administered 2019-08-19: 3 [IU] via SUBCUTANEOUS
  Administered 2019-08-19 – 2019-08-20 (×2): 2 [IU] via SUBCUTANEOUS

## 2019-08-15 MED ORDER — ISOSORB DINITRATE-HYDRALAZINE 20-37.5 MG PO TABS
1.0000 | ORAL_TABLET | Freq: Two times a day (BID) | ORAL | Status: DC
  Administered 2019-08-15 – 2019-08-20 (×10): 1 via ORAL
  Filled 2019-08-15 (×10): qty 1

## 2019-08-15 MED ORDER — ENOXAPARIN SODIUM 40 MG/0.4ML ~~LOC~~ SOLN: 40.0000 mg | SUBCUTANEOUS | Status: DC

## 2019-08-15 MED ORDER — IPRATROPIUM-ALBUTEROL 0.5-2.5 (3) MG/3ML IN SOLN
3.0000 mL | Freq: Once | RESPIRATORY_TRACT | Status: AC
  Administered 2019-08-15: 3 mL via RESPIRATORY_TRACT
  Filled 2019-08-15: qty 3

## 2019-08-15 MED ORDER — GABAPENTIN 300 MG PO CAPS
300.0000 mg | ORAL_CAPSULE | Freq: Every morning | ORAL | Status: DC
  Administered 2019-08-16 – 2019-08-20 (×5): 300 mg via ORAL
  Filled 2019-08-15 (×4): qty 1
  Filled 2019-08-15: qty 3

## 2019-08-15 MED ORDER — BENZONATATE 100 MG PO CAPS
200.0000 mg | ORAL_CAPSULE | Freq: Once | ORAL | Status: AC
  Administered 2019-08-15: 200 mg via ORAL
  Filled 2019-08-15: qty 2

## 2019-08-15 MED ORDER — NICOTINE 21 MG/24HR TD PT24
21.0000 mg | MEDICATED_PATCH | Freq: Every day | TRANSDERMAL | Status: DC
  Administered 2019-08-15: 21 mg via TRANSDERMAL
  Filled 2019-08-15 (×5): qty 1

## 2019-08-15 MED ORDER — FUROSEMIDE 10 MG/ML IJ SOLN
80.0000 mg | Freq: Once | INTRAMUSCULAR | Status: AC
  Administered 2019-08-15: 80 mg via INTRAVENOUS
  Filled 2019-08-15: qty 8

## 2019-08-15 MED ORDER — ACETAMINOPHEN 325 MG PO TABS
650.0000 mg | ORAL_TABLET | Freq: Four times a day (QID) | ORAL | Status: DC | PRN
  Administered 2019-08-16 (×2): 650 mg via ORAL
  Filled 2019-08-15 (×2): qty 2

## 2019-08-15 NOTE — H&P (Addendum)
[facility] Admission History and Physical Service Pager: [phone]  Patient name: Zachary Young Medical record number: [id number] Date of birth: 03/01/1961 Age: 59 y.o. Gender: male  Primary Care Provider: Patient, No Pcp Per Consultants: none  Code Status: FULL  Preferred Emergency Contact: [name] [phone]  Chief Complaint: Dyspnea  Assessment and Plan: Zachary Young is a 59 y.o. male presenting with CKD. PMH is significant for diabetes, CKD, obesity, CHF, hypertension, hyperlipidemia, coronary artery disease, MI, tobacco misuse.   Hypoxic respiratory failure likely 2/2 HFrEF Vs COPD exacerbation  Zachary Young is a 59 year old male who presents today with acute worsening of dyspnea on exertion and lower extremity edema to the waist in the last 2 days. Vital signs: SBP 144-171, DBP 79-111, HR 87, sats 93% on 2L and RR 21. BNP 148, Cr 4, BUN 37, Gap 14, WBC wnl. CXR: Bilateral pulmonary infiltrates, likely pulmonary edema. S/p Lasix 80mg  IV and KCl 103meq in the ED. On exam: anasarca, increased WOB, diffuse wheeze throughout lung fields. Most likely differential is CHF exacerbation given gradual worsening of LE edema and dyspnea, recent previous hospital admissions for CHF exacerbation, CXR changes and new oxygen requirement.  Considered COPD vs asthma exacerbation (though no formal dx) given diffuse wheezing improved with albuterol in ED, and as patient is chronic, current smoker smoking 1 pack every other day. Considered PE due to pleuritic chest pain and sedentary lifestyle. Wells score is 1.5.LE venous dopplers pending. Unable to perform CTA due to poor renal function & VQ scan unlikely to be diagnostic given body habitus.. Considered infectious process such an pneumonia or bronchitis as patient has worsening of his cough and sputum changes, however CXR is more suggestive of pulmonary edema rather than infection. Afebrile and WBC wnl. Resp panel neg.  Has had on going central chest pain which is occurs with deep inhalation ,though not associated with exertion. Low suspicion of ACS - no EKG findings & delta troponin 25>20. Pulmonary edema and anasarca are suggestive of volume overload in the setting of acute HF. BNP not impressive at 148.2 (last CHF exacerbation, probnp >1000). Patient s/p IV 80 lasix with no UOP charted in ED, so unclear if he had any response. Will provide torsemide 60 at 7 pm and follow up response. If no UOP, do not increase diuretics, call nephrology.  -Admit to FPTS, attending Dr. McDiarmid  -Vitals per floor routine, continuous pulse ox  - O2 PRN sats sustained and less that 90% -Obtain VBG -Strict I's and O's -Sodium restriction -2 L fluid restriction -Obtain dry weight  -Daily weights -Duonebs x1, reassess afterwards, if good response consider scheduling duonebs and starting steroids for COPD exacerbation  -Monitor urine output, consider starting torsemide this evening  -Echo -Am CBC and CMP   HFmrEF Last echo 06/22/19 at [facility] during CHF admission showed 45-50% LV EF, though poor visualization of structures. Home medications include lasix 80 mg BID, bumex 1mg  BID, bidil 20-37.5, amlodipine, metoprolol.  - fluid restriction < 2L  - continue home meds (hold home dose lasix & bumex).   Anasarca, testicular swelling On exam evidence of anasarca and gross bilateral testicular swelling.  No obvious masses in testicles. Both are most likely due to volume overload in the setting of CHF and intake of excess sodium per patient. Can also consider renal failure if oliguric. Unsure if response to lasix earlier today.  -Strict I's and O's -Daily weights -<2g Sodium restriction -2 L fluid restriction -Daily BMP with diuresis -  f/u post void residual   Left Lateral Abdominal pain On exam patient has edema extending to his abdomen, diffusely tender over his abdomen with tenderness on palpation of left lower quadrant.  No  guarding, bowel sounds present. Skin is warm to touch and mildly erythematous on left lateral side, though no obvious cellulitic appearance. Patient reports abdominal pain as internal. Superficial TTP likely edema. Cause of abdominal pain unclear but could be cellulitis given skin changes/viral gastritis in setting of recent diarrhea. Low risk C diff at this time. Obtaining UA to rule out pyelo/UTI vs nephrolithiasis.  -Abdominal US   -serial abdominal exams  -Tylenol 650mg  Q6H   Chest pain Endorses central, pleuritic chest pain in association with shortness of breath. Is not linked to exertion. EKG: SR but poor tracing.Troponin 25, 20 so low suspicion for ACS, however risk higher with hx of MI. Likely MSK in the setting of severe cough.  -Repeat EKG -Monitor chest pain -Low threshold to repeat troponins given history of MI  CKD IV Cr 4.09, BUN 37 on admission, recently admitted to [facility] for CHF exacerbation and discharged with Cr 3.33, stable on 4/26 at 3.9. GFR appears to be 19-35 since January 2021. Follows with Dr. Radene Knee, last visit 07/20/19. Nephrology did not feel he needed dialysis at the time. Does not appear uremic. BUN chronically in the 30's. UPC on 08/10/19 9.364.  Renal US 06/21/19 with normal R kidney, limited view L kidney, and PVR 315.  -follow up Mg, Phos  -Monitor creatinine, Mg, Phos -Consider Nephrology consult if kidney function worsens - continue home phoslo 1334 TID,   - follow up post void residual   Coronary artery disease, MI  Per [facility] notes patient sees cardiologist in [place]. - continue home ASA 81  -Monitor for chest pain   Diabetes CBG on admission 397, last A1c 11.1 on 12/14/2018 Home meds: Lantus 10 units once daily, Humalog 3 times daily with meals, victoza 1.2 mg daily  -Sensitive sliding scale -Monitor CBGs 3 times daily and bedtime -transition to lantus + TID AC after first 24 hours -Repeat HbA1c  Hypertension BP today 158/95 Home meds: Amlodipine 10  mg once daily, metoprolol 100 mg daily -Continue amlodipine 10 mg -Continue Metoprolol 100mg  once daily   Hyperlipidemia No recent lipid panel on file  -Atorvastatin 80 mg once daily -F/u Lipid panel  Tobacco misuse disorder Smokes 1 pack every 2 days, chronic smoker  -Nicotine patch 12mg   -Encourage smoking cessation   Penile Lesion  Small irregular lesion on dorsal aspect of body of penis. No obvious purulent discharge/bleeding. Patient endorses pruritis in that area. Did not obtain full sexual history, however patient denies history of STDs. Likely self inflicted given personal account of pruritis. No obvious rash in area. Lesion is not consistent with chancre, HSV.  -Monitor area -hydrocortisone cream for pruritis.   H/o COVID 02 Feb 2019, GI bleed, osteomyelitis post amputation of left & right toes.    FEN/GI: Heart healthy diet, sodium restriction, fluid restriction 2015ml  Prophylaxis: Lovenox 30mg    Disposition: IP next 1-2 days   History of Present Illness:  Zachary Young is a 59 y.o. male presenting with stomach pain and swelling for 4-5 months.  History was limited due to patient being poor historian  Endorses that "breathing issues started two days ago" but has had long standing history of swelling in legs spreading to his abdomen over the last 4-5 months.  He is able to walk 2 to 3 feet before  feeling extremely short of breath.  Uses as many pillows as he can at night and endorses orthopnea. Pleuritic chest pain started over the last 2 days which is not linked to exertion.  Has a chronic productive cough and endorses change in brown sputum.  Has had subjective fevers at home. Denies missing any medications.  Denies sick/COVID contacts. Currently an active smoker, smoking about 1 pack every two days. Lives with his brother's family. Needs help with bathing, washing etc Was getting help from [place], but they haven't been around in a while.   Review Of Systems: Per  HPI with the following additions:   Review of Systems  Constitutional: Positive for fever. Negative for chills.  HENT: Negative for facial swelling, postnasal drip and sneezing.   Respiratory: Positive for cough, shortness of breath and wheezing.   Cardiovascular: Positive for chest pain and leg swelling.  Gastrointestinal: Positive for diarrhea, nausea and vomiting. Negative for abdominal pain and blood in stool.  Genitourinary: Positive for scrotal swelling. Negative for difficulty urinating, dysuria and hematuria.  Skin: Negative for color change.  Neurological: Negative for dizziness, syncope, weakness, light-headedness and numbness.  Psychiatric/Behavioral: Positive for agitation.     Patient Active Problem List   Diagnosis Date Noted  . GI bleed 12/14/2018    Past Medical History: Past Medical History:  Diagnosis Date  . CHF (congestive heart failure) ([place])   . Diabetes mellitus without complication ([place])   . Peripheral edema   . Renal disorder    kidney disease    Past Surgical History: Past Surgical History:  Procedure Laterality Date  . TOE AMPUTATION Right    due to osteomyelitis    Social History: Social History   Tobacco Use  . Smoking status: Current Every Day Smoker    Packs/day: 0.50    Types: Cigarettes  . Smokeless tobacco: Never Used  Substance Use Topics  . Alcohol use: Not Currently  . Drug use: Never   Additional social history:  Please also refer to relevant sections of EMR.  Family History: History reviewed. No pertinent family history. (If not completed, MUST add something in)  Allergies and Medications: Allergies  Allergen Reactions  . Bee Venom   . Eggs Or Egg-Derived Products   . Morphine And Related    Copied/pasted from most recent nephrology note on 07/20/19  MEDICATIONS: Current Outpatient Medications  Medication Sig Dispense Refill  . acetaminophen (TYLENOL EXTRA STRENGTH) 500 MG tablet Take 2 tablets (1,000 mg total) by  mouth every eight (8) hours as needed for pain. 100 tablet 2  . amLODIPine (NORVASC) 10 MG tablet Take 1 tablet (10 mg total) by mouth daily. 90 tablet 3  . aspirin 81 MG chewable tablet Chew 1 tablet (81 mg total) daily. 36 tablet 0  . atorvastatin (LIPITOR) 80 MG tablet Take 1 tablet (80 mg total) by mouth daily. 90 tablet 3  . bumetanide (BUMEX) 1 MG tablet Take 1 tablet (1 mg total) by mouth Two (2) times a day. 60 tablet 0  . calcium acetate,phosphat bind, (PHOSLO) 667 mg capsule Take 2 capsules (1,334 mg total) by mouth Three (3) times a day with a meal. 180 capsule 11  . ergocalciferol-1,250 mcg, 50,000 unit, (DRISDOL) 1,250 mcg (50,000 unit) capsule Take 1 capsule (1,250 mcg total) by mouth once a week. 4 capsule 1  . gabapentin (NEURONTIN) 300 MG capsule Take 1 capsule (300 mg total) by mouth Three (3) times a day. 270 capsule 3  . insulin glargine (LANTUS)  100 unit/mL injection Inject 0.1 mL (10 Units total) under the skin nightly. 10 mL 3  . insulin lispro (HUMALOG) 100 unit/mL injection Inject 0.03 mL (3 Units total) under the skin Three (3) times a day before meals. 10 mL 0  . insulin syringe-needle U-100 0.5 mL 30 gauge x 1/2" (12 mm) Syrg Use with insulin up to 4 times/day as needed. 1 each 0  . isosorbide-hydrALAZINE (BIDIL) 20-37.5 mg per tablet Take 1 tablet by mouth Three (3) times a day. 270 tablet 3  . melatonin 3 mg Tab Take 1 tablet (3 mg total) by mouth nightly as needed. 60 tablet 0  . metoprolol succinate (TOPROL-XL) 100 MG 24 hr tablet Take 1 tablet (100 mg total) by mouth daily. 90 tablet 3  . oxyCODONE-acetaminophen (PERCOCET) 5-325 mg per tablet Take 1 tablet by mouth every eight (8) hours as needed. 20 tablet 0  . pen needle, diabetic (PEN NEEDLE) 31 gauge x 5/16" (8 mm) Ndle Injection Frequency is 1 time per day; Dx Code: Type 2 Diabetes uncontrolled (E11.65) 100 each 11  . liraglutide (VICTOZA) injection pen Inject 0.2 mL (1.2 mg total) under the skin daily. 6 mL 1    Objective: BP (!) 158/95 (BP Location: Right Arm)   Pulse 89   Temp 98.3 F (36.8 C) (Oral)   Resp (!) 21   Ht 6\' 3"  (1.905 m)   Wt (!) 154.2 kg   SpO2 91%   BMI 42.50 kg/m   Exam: General: unwell appearing, appears older than stated age, morbidly obese, in acute distress coughing, 2L  noted.  Eyes: normal EOM, no scleral icterus ENTM: no nasal drainage, poor dentition, dry MM  Neck: supple, normal ROM  Cardiovascular: S1 and S2 present, RRR Respiratory: wide spread inspiratory and expiratory wheeze, moderately increased WOB, limited exam due to cough and body habitus  Gastrointestinal: abdomen distended, generalized tenderness, more so in left lower quadrant, no guarding, bowel sounds present MSK: bilateral amputation of all toes Peripheral: bilateral pitting edema up to abdomen (anasarca)  Derm: chronic venous changes over shins  Neuro: cranial nerves grossly in tact Psych: normal mood, normal affect  Labs and Imaging: CBC BMET  Recent Labs  Lab 08/15/19 0944  WBC 10.2  HGB 10.5*  HCT 33.7*  PLT 380   Recent Labs  Lab 08/15/19 0944  NA 140  K 4.3  CL 110  CO2 16*  BUN 37*  CREATININE 4.09*  GLUCOSE 78  CALCIUM 8.2*     EKG: SR, poor quality, will repeat   Lattie Haw, MD 08/15/2019, 1:20 PM PGY-1, [facility] Intern pager: [phone], text pages welcome  FPTS Upper-Level Resident Addendum I have independently interviewed and examined the patient. I have discussed the above with the original author and agree with their documentation. My edits for correction/addition/clarification are in - purple. Please see also any attending notes.  [place] Service pager: [phone] (text pages welcome through [facility])  Wilber Oliphant, M.D.  PGY-2 08/15/2019 5:40 PM

## 2019-08-15 NOTE — ED Triage Notes (Addendum)
PT here from hotel via [place] for sob and L sided chest pain and sob.  Hx of chf.  Initial sats of 88% that increased to 95 on 4L.  Productive cough.  90% on RA in ED.  O2 reduced to 2L with sats of 96%.

## 2019-08-15 NOTE — ED Provider Notes (Addendum)
[facility] EMERGENCY DEPARTMENT Provider Note   CSN: [id number] Arrival date & time: 08/15/19  0857     History Chief Complaint  Patient presents with  . Shortness of Breath    Zachary Young is a 59 y.o. male with a history of CHF last EF 45-50%, CAD, diabetes mellitus, CKD, and obesity who presents to the ED via EMS with complaints of dyspnea which he states has been progressively worsening for the past 2 months. Patient states shortness of breath is constant, worse with exertion, no alleviating factors, has gotten progressively worse with associated lower extremity, testicle, and lower abdominal swelling. States over the past few days he developed chest pain and somewhat productive cough. Chest pain is constant, to the left chest, sharp in nature, worse with movement, cough, and deep breathing. No alleviating factors. He reports subjective fever over the past 24 hours as well. Denies nasal congestion, ear pain, sore throat, syncope,  hemoptysis, recent surgery/trauma, recent long travel, hormone use, personal hx of cancer, or hx of DVT/PE.  Patient denies known COVID-19 exposures, he states he has not been vaccinated at this point. He has not missed any doses of his diuretic- he is unsure what he takes and what the dose is.   Additional history per EMS SpO2 88% on RA, applied 4L via [place] with improvement to mid 90s.   HPI     Past Medical History:  Diagnosis Date  . CHF (congestive heart failure) ([place])   . Diabetes mellitus without complication ([place])   . Peripheral edema   . Renal disorder    kidney disease    Patient Active Problem List   Diagnosis Date Noted  . GI bleed 12/14/2018    Past Surgical History:  Procedure Laterality Date  . TOE AMPUTATION Right    due to osteomyelitis       No family history on file.  Social History   Tobacco Use  . Smoking status: Current Every Day Smoker    Packs/day: 0.50    Types: Cigarettes  . Smokeless  tobacco: Never Used  Substance Use Topics  . Alcohol use: Not Currently  . Drug use: Never    Home Medications Prior to Admission medications   Medication Sig Start Date End Date Taking? Authorizing Provider  amLODipine (NORVASC) 10 MG tablet Take 10 mg by mouth daily.  08/04/18   [provider]  atorvastatin (LIPITOR) 80 MG tablet Take 80 mg by mouth daily at 6 PM.  08/04/18   [provider]  furosemide (LASIX) 80 MG tablet Take 80 mg by mouth daily.  12/03/18 03/03/19  [provider]  gabapentin (NEURONTIN) 400 MG capsule Take 400 mg by mouth at bedtime.  08/15/18   [provider]  insulin glargine (LANTUS) 100 UNIT/ML injection Inject 10 Units into the skin at bedtime.     [provider]  insulin lispro (HUMALOG) 100 UNIT/ML injection Inject into the skin 3 (three) times daily with meals.  12/03/18 01/02/19  [provider]  metoprolol succinate (TOPROL-XL) 100 MG 24 hr tablet Take 100 mg by mouth daily.  12/04/18 01/03/19  [provider]  PRESCRIPTION MEDICATION Take 1 tablet by mouth daily.    [provider]    Allergies    Bee venom, Eggs or egg-derived products, and Morphine and related  Review of Systems   Review of Systems  Constitutional: Positive for fever. Negative for chills and diaphoresis.  HENT: Negative for congestion, ear pain and  sore throat.   Respiratory: Positive for cough and shortness of breath.   Cardiovascular: Positive for chest pain and leg swelling.  Gastrointestinal: Positive for abdominal distention (swelling). Negative for abdominal pain, nausea and vomiting.  Genitourinary: Positive for scrotal swelling.  Neurological: Negative for syncope.  All other systems reviewed and are negative.   Physical Exam Updated Vital Signs BP (!) 158/95 (BP Location: Right Arm)   Pulse 89   Temp 98.3 F (36.8 C) (Oral)   Resp (!) 21   Ht 6\' 3"  (1.905 m)   Wt (!) 154.2 kg   SpO2 91%   BMI  42.50 kg/m   Physical Exam Vitals and nursing note reviewed.  Constitutional:      Appearance: He is well-developed. He is obese. He is not toxic-appearing.  HENT:     Head: Normocephalic and atraumatic.  Eyes:     General:        Right eye: No discharge.        Left eye: No discharge.     Conjunctiva/sclera: Conjunctivae normal.  Cardiovascular:     Rate and Rhythm: Normal rate and regular rhythm.  Pulmonary:     Effort: Tachypnea present. No respiratory distress.     Breath sounds: Transmitted upper airway sounds present. Rales (bibasilar) present. No wheezing.     Comments: SPO2 88% on room air, improved to mid 90s with 2 L via nasal cannula applied. Chest:     Chest wall: Tenderness (Left anterior chest wall which patient states reproduces his chest pain.) present.  Abdominal:     Palpations: Abdomen is soft.     Tenderness: There is no abdominal tenderness.     Comments: Pitting edema noted to the lower abdomen.  Genitourinary:    Comments: Scrotal edema noted.  Chaperone present. Musculoskeletal:     Cervical back: Neck supple.     Comments: Symmetric pitting edema to bilateral lower extremities.  Patient has bilateral transmetatarsal amputations.  Skin:    General: Skin is warm and dry.     Findings: No rash.  Neurological:     Mental Status: He is alert.     Comments: Clear speech.   Psychiatric:        Behavior: Behavior normal.    ED Results / Procedures / Treatments   Labs (all labs ordered are listed, but only abnormal results are displayed) Labs Reviewed  COMPREHENSIVE METABOLIC PANEL - Abnormal; Notable for the following components:      Result Value   CO2 16 (*)    BUN 37 (*)    Creatinine, Ser 4.09 (*)    Calcium 8.2 (*)    Albumin 2.4 (*)    GFR calc non Af Amer 15 (*)    GFR calc Af Amer 18 (*)    All other components within normal limits  CBC WITH DIFFERENTIAL/PLATELET - Abnormal; Notable for the following components:   RBC 3.67 (*)     Hemoglobin 10.5 (*)    HCT 33.7 (*)    RDW 16.8 (*)    Neutro Abs 8.0 (*)    All other components within normal limits  BRAIN NATRIURETIC PEPTIDE - Abnormal; Notable for the following components:   B Natriuretic Peptide 148.2 (*)    All other components within normal limits  TROPONIN I (HIGH SENSITIVITY) - Abnormal; Notable for the following components:   Troponin I (High Sensitivity) 25 (*)    All other components within normal limits  RESPIRATORY PANEL BY RT PCR (FLU  A&B, COVID)  TROPONIN I (HIGH SENSITIVITY)    EKG EKG Interpretation  Date/Time:  Saturday Aug 15 2019 09:08:06 EDT Ventricular Rate:  92 PR Interval:    QRS Duration: 88 QT Interval:  403 QTC Calculation: 496 R Axis:   6 Text Interpretation: Sinus rhythm Low voltage, precordial leads Consider anterior infarct Baseline wander in lead(s) II V1 Poor data quality in current ECG precludes serial comparison Confirmed by Pattricia Boss [phone]) on 08/15/2019 11:01:56 AM   Radiology DG Chest Port 1 View  Result Date: 08/15/2019 CLINICAL DATA:  Dyspnea, history diabetes mellitus, CHF, smoker EXAM: PORTABLE CHEST 1 VIEW COMPARISON:  Portable exam 1123 hours compared to 05/15/2019 FINDINGS: Enlargement of cardiac silhouette with pulmonary vascular congestion. Mild diffuse BILATERAL pulmonary infiltrates likely representing pulmonary edema though infection not entirely excluded. No segmental consolidation, pleural effusion or pneumothorax. Osseous structures unremarkable. IMPRESSION: Probable pulmonary edema/CHF as above. Electronically Signed   By: Lavonia Dana M.D.   On: 08/15/2019 12:55    Procedures .Critical Care Performed by: Amaryllis Dyke, PA-C Authorized by: Amaryllis Dyke, PA-C     (including critical care time)  CRITICAL CARE Performed by: Kennith Maes   Total critical care time: 35 minutes  Critical care time was exclusive of separately billable procedures and treating other patients.   Critical care was necessary to treat or prevent imminent or life-threatening deterioration.  Critical care was time spent personally by me on the following activities: development of treatment plan with patient and/or surrogate as well as nursing, discussions with consultants, evaluation of patient's response to treatment, examination of patient, obtaining history from patient or surrogate, ordering and performing treatments and interventions, ordering and review of laboratory studies, ordering and review of radiographic studies, pulse oximetry and re-evaluation of patient's condition.  Medications Ordered in ED Medications  furosemide (LASIX) injection 80 mg (has no administration in time range)  potassium chloride SA (KLOR-CON) CR tablet 20 mEq (has no administration in time range)  sodium chloride flush (NS) 0.9 % injection 3 mL (3 mLs Intravenous Given 08/15/19 1233)  benzonatate (TESSALON) capsule 200 mg (200 mg Oral Given 08/15/19 1059)    ED Course  I have reviewed the triage vital signs and the nursing notes.  Pertinent labs & imaging results that were available during my care of the patient were reviewed by me and considered in my medical decision making (see chart for details).    Zachary Young was evaluated in Emergency Department on 08/15/2019 for the symptoms described in the history of present illness. He/she was evaluated in the context of the global COVID-19 pandemic, which necessitated consideration that the patient might be at risk for infection with the SARS-CoV-2 virus that causes COVID-19. Institutional protocols and algorithms that pertain to the evaluation of patients at risk for COVID-19 are in a state of rapid change based on information released by regulatory bodies including the CDC and federal and state organizations. These policies and algorithms were followed during the patient's care in the ED.  MDM Rules/Calculators/A&P                      Patient presents to the  ED via EMS with concern for dyspnea & edema that has been progressively worsening for the past 2 months with note of cough/chest pain/fever over the past coupe of days as well.  Patient is tachypneic, hypoxic on RA- improved with 2L via [place], BP mildly elevated. Rales noted at the bases.   Suspect  CHF exacerbation is most likely, additional DDx include pneumonia, COVID 10, PE, ACS, bronchitis, pneumothorax, critical anemia.    Additional history obtained:  Additional history obtained from EMS. Previous records obtained and reviewed- Echocardiogram 06/22/19: EF 45-50%  Recent hospitalization 06/2019 for HF exacerbation & gross volume overload, he was hospitalized for approximately 2 weeks, creatinine prior to admission was 2.8-3.2 but increased in the setting of aggressive diuresis with a peak creatinine of 6.1.  Dialysis was not initiated.  Transition to p.o. diuretics, creatinine at discharge was 5.6 and subsequently 3.3 when checked 10 days later.  EKG: Initial somewhat poor quality, no STEMI  Lab Tests:  I Ordered, reviewed, and interpreted labs, which included:  CBC: Anemia somewhat worse than prior. No leukocytosis.  CMP: Creatinine elevated at 4.09, most recently 3.90 Troponin: Mildly elevated at 25, suspect a degree of demand. BNP elevated mildly. COVID: Pending  Imaging Studies ordered:  I ordered imaging studies which included Chest xray, I independently visualized and interpreted imaging which showed findings consistent with CHF.   ED Course:   Presentation overall appears consistent with CHF exacerbation with acute hypoxic respiratory failure, he has a history of abnormal renal function with his prior CHF exacerbations.  Will start diuresis and consult for admission.  13:13: CONSULT: Discussed case with internal medicine service accept admission.   Critical interventions:  Supplement oxygen & IV diuresis.   Findings and plan of care discussed with supervising physician Dr. Jeanell Sparrow  who has evaluated patient & is in agreement.   Portions of this note were generated with [employer]. Dictation errors may occur despite best attempts at proofreading.  Final Clinical Impression(s) / ED Diagnoses Final diagnoses:  Acute on chronic congestive heart failure, unspecified heart failure type ([place])  Chronic kidney disease, unspecified CKD stage  Acute hypoxemic respiratory failure [facility])    Rx / DC Orders ED Discharge Orders    None       Amaryllis Dyke, PA-C 08/15/19 1321    [address] [place], PA-C 08/15/19 1321    Pattricia Boss, MD 08/15/19 [phone]

## 2019-08-16 ENCOUNTER — Inpatient Hospital Stay (HOSPITAL_COMMUNITY): Payer: Medicaid Other

## 2019-08-16 DIAGNOSIS — I5021 Acute systolic (congestive) heart failure: Secondary | ICD-10-CM | POA: Diagnosis not present

## 2019-08-16 DIAGNOSIS — I5043 Acute on chronic combined systolic (congestive) and diastolic (congestive) heart failure: Secondary | ICD-10-CM | POA: Diagnosis not present

## 2019-08-16 DIAGNOSIS — R1030 Lower abdominal pain, unspecified: Secondary | ICD-10-CM

## 2019-08-16 DIAGNOSIS — N184 Chronic kidney disease, stage 4 (severe): Secondary | ICD-10-CM | POA: Diagnosis not present

## 2019-08-16 DIAGNOSIS — R109 Unspecified abdominal pain: Secondary | ICD-10-CM

## 2019-08-16 DIAGNOSIS — R079 Chest pain, unspecified: Secondary | ICD-10-CM | POA: Diagnosis not present

## 2019-08-16 LAB — LIPID PANEL
Cholesterol: 115 mg/dL (ref 0–200)
HDL: 41 mg/dL (ref >40)
LDL Cholesterol: 50 mg/dL (ref 0–99)
Total CHOL/HDL Ratio: 2.8 RATIO
Triglycerides: 119 mg/dL (ref <150)
VLDL: 24 mg/dL (ref 0–40)

## 2019-08-16 LAB — COMPREHENSIVE METABOLIC PANEL
ALT: 13 U/L (ref 0–44)
AST: 15 U/L (ref 15–41)
Albumin: 2.1 g/dL — ABNORMAL LOW (ref 3.5–5.0)
Alkaline Phosphatase: 96 U/L (ref 38–126)
Anion gap: 9 (ref 5–15)
BUN: 41 mg/dL — ABNORMAL HIGH (ref 6–20)
CO2: 21 mmol/L — ABNORMAL LOW (ref 22–32)
Calcium: 8 mg/dL — ABNORMAL LOW (ref 8.9–10.3)
Chloride: 110 mmol/L (ref 98–111)
Creatinine, Ser: 4.06 mg/dL — ABNORMAL HIGH (ref 0.61–1.24)
GFR calc Af Amer: 18 mL/min — ABNORMAL LOW (ref >60)
GFR calc non Af Amer: 15 mL/min — ABNORMAL LOW (ref >60)
Glucose, Bld: 110 mg/dL — ABNORMAL HIGH (ref 70–99)
Potassium: 4.2 mmol/L (ref 3.5–5.1)
Sodium: 140 mmol/L (ref 135–145)
Total Bilirubin: 0.8 mg/dL (ref 0.3–1.2)
Total Protein: 6.4 g/dL — ABNORMAL LOW (ref 6.5–8.1)

## 2019-08-16 LAB — HEMOGLOBIN A1C
Hgb A1c MFr Bld: 8.5 % — ABNORMAL HIGH (ref 4.8–5.6)
Mean Plasma Glucose: 197.25 mg/dL

## 2019-08-16 LAB — CBC
HCT: 28.8 % — ABNORMAL LOW (ref 39.0–52.0)
Hemoglobin: 9 g/dL — ABNORMAL LOW (ref 13.0–17.0)
MCH: 27.7 pg (ref 26.0–34.0)
MCHC: 31.3 g/dL (ref 30.0–36.0)
MCV: 88.6 fL (ref 80.0–100.0)
Platelets: 342 10*3/uL (ref 150–400)
RBC: 3.25 MIL/uL — ABNORMAL LOW (ref 4.22–5.81)
RDW: 17 % — ABNORMAL HIGH (ref 11.5–15.5)
WBC: 8.2 10*3/uL (ref 4.0–10.5)
nRBC: 0 % (ref 0.0–0.2)

## 2019-08-16 LAB — GLUCOSE, CAPILLARY
Glucose-Capillary: 103 mg/dL — ABNORMAL HIGH (ref 70–99)
Glucose-Capillary: 138 mg/dL — ABNORMAL HIGH (ref 70–99)
Glucose-Capillary: 152 mg/dL — ABNORMAL HIGH (ref 70–99)
Glucose-Capillary: 196 mg/dL — ABNORMAL HIGH (ref 70–99)

## 2019-08-16 LAB — ECHOCARDIOGRAM COMPLETE
Height: 75 in
Weight: 5444.8 oz

## 2019-08-16 MED ORDER — FUROSEMIDE 10 MG/ML IJ SOLN
80.0000 mg | Freq: Once | INTRAMUSCULAR | Status: AC
  Administered 2019-08-16: 80 mg via INTRAVENOUS
  Filled 2019-08-16: qty 8

## 2019-08-16 MED ORDER — DM-GUAIFENESIN ER 30-600 MG PO TB12
1.0000 | ORAL_TABLET | Freq: Two times a day (BID) | ORAL | Status: DC
  Administered 2019-08-16: 1 via ORAL
  Filled 2019-08-16: qty 1

## 2019-08-16 MED ORDER — GUAIFENESIN ER 600 MG PO TB12
600.0000 mg | ORAL_TABLET | Freq: Two times a day (BID) | ORAL | Status: DC
  Administered 2019-08-16 – 2019-08-20 (×8): 600 mg via ORAL
  Filled 2019-08-16 (×8): qty 1

## 2019-08-16 MED ORDER — TORSEMIDE 20 MG PO TABS
60.0000 mg | ORAL_TABLET | Freq: Once | ORAL | Status: AC
  Administered 2019-08-16: 60 mg via ORAL
  Filled 2019-08-16: qty 3

## 2019-08-16 MED ORDER — FUROSEMIDE 10 MG/ML IJ SOLN: 80.0000 mg | Freq: Once | INTRAMUSCULAR | Status: DC

## 2019-08-16 MED ORDER — PERFLUTREN LIPID MICROSPHERE
1.0000 mL | INTRAVENOUS | Status: AC | PRN
  Administered 2019-08-16: 2 mL via INTRAVENOUS
  Filled 2019-08-16: qty 10

## 2019-08-16 MED ORDER — GUAIFENESIN ER 600 MG PO TB12: 600.0000 mg | ORAL_TABLET | Freq: Two times a day (BID) | ORAL | Status: DC

## 2019-08-16 MED ORDER — DEXTROMETHORPHAN POLISTIREX ER 30 MG/5ML PO SUER
30.0000 mg | Freq: Two times a day (BID) | ORAL | Status: DC | PRN
  Administered 2019-08-16 – 2019-08-20 (×10): 30 mg via ORAL
  Filled 2019-08-16 (×11): qty 5

## 2019-08-16 NOTE — Hospital Course (Addendum)
Zachary Young is a 59 y.o. male who presented with acute worsening of dyspnea on exertion and lower extremity edema PMH is significant for diabetes, CKD, obesity, CHF, hypertension, hyperlipidemia, coronary artery disease, MI, tobacco misuse.   Hypoxic respiratory failure likely 2/2 CHF exacerbation Patient presented with acute worsening of dyspnea on exertion and lower extremity edema to the waist for 2 days. ED work up s/f sats 93% on 2L. BNP 148, Cr 4, BUN 37, Gap 14, WBC wnl. CXR: Bilateral pulmonary infiltrates, likely pulmonary edema. S/p Lasix 80mg  IV and KCl 73meq in the ED.  Echo ***   Left Lateral Abdominal pain Abdominal US ***   Chest pain Endorses central, pleuritic chest pain in association with shortness of breath. Is not linked to exertion. EKG: SR but poor tracing.Troponin 25, 20 so low suspicion for ACS, however risk higher with hx of MI. Likely MSK in the setting of severe cough.    CKD IV Cr 4.09, BUN 37 on admission    Follow up  Follow up anemia  Will rx torsemide BID on dc

## 2019-08-16 NOTE — Progress Notes (Signed)
Family Medicine Teaching Service Daily Progress Note Intern Pager: [phone]  Patient name: Zachary Young Medical record number: [id number] Date of birth: 1960-10-02 Age: 59 y.o. Gender: male  Primary Care Provider: Patient, No Pcp Per Consultants: N/A Code Status: FULL  Pt Overview and Major Events to Date:  5/1 Admitted  Assessment and Plan: Tramane Gorum is a 59 y.o. male presenting with CKD. PMH is significant for diabetes, CKD, obesity, CHF, hypertension, hyperlipidemia, coronary artery disease, MI, tobacco misuse. COVID 02 Feb 2019, GI bleed, osteomyelitis post amputation of left & right toes.    Hypoxic respiratory failure likely 2/2 HFrEF Currently endorsing coughing spells s/p delsym; received 1 dose of mucinex DM this morning. Lungs sounds weight. Faint heart sounds. Satting 94 on 2L [place]. Wt. Up 0.2kg since admission. Dry wt appears to be 310lbs. He is at 340lbs. UOP 1.3L. S/p Lasix 80mg  IV and KCl 58meq in the ED. -Continue Lasix 80mg  IV  -Vitals per floor routine, continuous pulse ox  - O2 PRN sats sustained and less that 90% -Strict I's and O's -Sodium restriction -2 L fluid restriction  -Daily weights -f/u Echo -Am CBC and CMP -Mucinex + Delsym    HFmrEF Last echo 06/22/19 at [facility] during CHF admission showed 45-50% LV EF, though poor visualization of structures. Home medications include lasix 80 mg BID, bumex 1mg  BID, bidil 20-37.5, amlodipine, metoprolol.  - fluid restriction < 2L  - continue bidil (hold home dose lasix & bumex).  -Continue lasix as above   Anasarca, testicular swelling Both are most likely due to volume overload in the setting of CHF and intake of excess sodium per patient. Albumin 2.4. Appropriate response to lasix as above. -Strict I's and O's -Daily weights -<2g Sodium restriction -2 L fluid restriction -Daily BMP with diuresis   Left Lateral Abdominal pain Abdominal U/S:Very limited exam. No definite acute abnormality  identified sonographically.The proximal abdominal aorta measures 2.9 cm in diameter. Ectatic abdominal aorta at risk for aneurysm development. Cause of abdominal pain unclear but could be cellulitis given skin changes/viral gastritis in setting of recent diarrhea.    -Tylenol 650mg  Q6H   Chest pain. Resolved. -Monitor chest pain -Low threshold to repeat troponins given history of MI  CKD IV Cr 4.06. Follows with Dr. Radene Knee, last visit 07/20/19. Nephrology did not feel he needed dialysis at the time. Does not appear uremic. BUN chronically in the 30's. UPC on 08/10/19 9.364.  Renal US 06/21/19 with normal R kidney, limited view L kidney, and PVR 315.  -Monitor creatinine, Mg, Phos -Consider Nephrology consult if kidney function worsens - continue home phoslo 1334 TID   Coronary artery disease, MI  Per [facility] notes patient sees cardiologist in [place]. - continue home ASA 81  -Monitor for chest pain   Diabetes CBG on admission 397 now 103, last A1c 8.5 Home meds: Lantus 10 units once daily, Humalog 3 times daily with meals, victoza 1.2 mg daily  -Sensitive sliding scale -Monitor CBGs 3 times daily and bedtime -transition to lantus + TID AC after first 24 hours  Hypertension BP today 152/80 Home meds: Amlodipine 10 mg once daily, metoprolol 100 mg daily -Continue amlodipine 10 mg  -Continue Metoprolol 100mg  once daily   Hyperlipidemia Tot chol 115, HDL 41, LDL 50. ASCVD risk 25%. -Atorvastatin 80 mg once daily  Tobacco misuse disorder Smokes 1 pack every 2 days, chronic smoker  -Nicotine patch 12mg   -Encourage smoking cessation   Penile Lesion  Small irregular lesion on dorsal aspect of  body of penis. Likely self inflicted given personal account of pruritis.  -Monitor area -hydrocortisone cream for pruritis.   FEN/GI: Heart healthy diet, sodium restriction, fluid restriction 2014ml  Prophylaxis: Lovenox 30mg    Disposition: Likely home in 1-2 days pending symptom  improvement with continued medical treatment.  Subjective:  Endorses cough. No longer experiencing chest pain.  Objective: Temp:  [97.8 F (36.6 C)-98.4 F (36.9 C)] 98 F (36.7 C) (05/02 0503) Pulse Rate:  [86-93] 90 (05/02 0503) Resp:  [18-22] 18 (05/02 0503) BP: (139-171)/(75-111) 139/81 (05/02 0503) SpO2:  [91 %-99 %] 94 % (05/02 0503) Weight:  [154.2 kg-159.6 kg] 154.4 kg (05/02 0503)   Physical Exam:  General: Appears unwell, no acute distress. Age appropriate. Cardiac: RRR, faint heart sounds, no murmurs Respiratory: crackles at lung bases bilaterally, normal effort Abdomen: soft, nontender, markedly distended, +bowel sounds Extremities: Severe LE edema  Skin: Warm and dry, LE venous stasis skin changes Neuro: alert and oriented x4  Laboratory: Recent Labs  Lab 08/15/19 0944  WBC 10.2  HGB 10.5*  HCT 33.7*  PLT 380   Recent Labs  Lab 08/15/19 0944  NA 140  K 4.3  CL 110  CO2 16*  BUN 37*  CREATININE 4.09*  CALCIUM 8.2*  PROT 7.5  BILITOT 1.2  ALKPHOS 106  ALT 14  AST 18  GLUCOSE 78    Imaging/Diagnostic Tests:  CHEST - 2 VIEW COMPARISON:  December 14, 2018 IMPRESSION: Bilateral pulmonary opacities could represent pulmonary edema given history. However, an infectious process, including atypical infections, is not excluded on this study. There is no cardiomegaly.  PORTABLE CHEST 1 VIEW COMPARISON:  Portable exam 1123 hours compared to 05/15/2019 IMPRESSION: Probable pulmonary edema/CHF as above.  ABDOMEN ULTRASOUND COMPLETE COMPARISON:  None. IMPRESSION: 1. Very limited exam. No definite acute abnormality identified sonographically. 2. The proximal abdominal aorta measures 2.9 cm in diameter. Ectatic abdominal aorta at risk for aneurysm development. Recommend followup by ultrasound in 5 years. This recommendation follows ACR consensus guidelines: White Paper of the ACR Incidental Findings Committee II on Vascular Findings. J Am Coll  Radiol 2013; 10:789-794.  Gerlene Fee, DO 08/16/2019, 7:47 AM PGY-1, [place] Intern pager: [phone], text pages welcome

## 2019-08-16 NOTE — Progress Notes (Signed)
VASCULAR LAB PRELIMINARY  PRELIMINARY  PRELIMINARY  PRELIMINARY  Bilateral lower extremity venous duplex completed.    Preliminary report:  See CV proc for preliminary results.   Yaman Grauberger, RVT 08/16/2019, 1:57 PM

## 2019-08-16 NOTE — Progress Notes (Signed)
  Echocardiogram 2D Echocardiogram has been performed.  Krayton Wortley A Avishai Reihl 08/16/2019, 3:36 PM

## 2019-08-17 ENCOUNTER — Inpatient Hospital Stay (HOSPITAL_COMMUNITY): Payer: Medicaid Other

## 2019-08-17 DIAGNOSIS — I509 Heart failure, unspecified: Secondary | ICD-10-CM | POA: Diagnosis not present

## 2019-08-17 DIAGNOSIS — I5043 Acute on chronic combined systolic (congestive) and diastolic (congestive) heart failure: Secondary | ICD-10-CM | POA: Diagnosis not present

## 2019-08-17 DIAGNOSIS — R1032 Left lower quadrant pain: Secondary | ICD-10-CM | POA: Diagnosis not present

## 2019-08-17 DIAGNOSIS — R0600 Dyspnea, unspecified: Secondary | ICD-10-CM

## 2019-08-17 DIAGNOSIS — R601 Generalized edema: Secondary | ICD-10-CM | POA: Diagnosis not present

## 2019-08-17 DIAGNOSIS — R0603 Acute respiratory distress: Secondary | ICD-10-CM

## 2019-08-17 LAB — BASIC METABOLIC PANEL
Anion gap: 11 (ref 5–15)
BUN: 41 mg/dL — ABNORMAL HIGH (ref 6–20)
CO2: 20 mmol/L — ABNORMAL LOW (ref 22–32)
Calcium: 7.9 mg/dL — ABNORMAL LOW (ref 8.9–10.3)
Chloride: 107 mmol/L (ref 98–111)
Creatinine, Ser: 3.93 mg/dL — ABNORMAL HIGH (ref 0.61–1.24)
GFR calc Af Amer: 19 mL/min — ABNORMAL LOW (ref >60)
GFR calc non Af Amer: 16 mL/min — ABNORMAL LOW (ref >60)
Glucose, Bld: 152 mg/dL — ABNORMAL HIGH (ref 70–99)
Potassium: 4.6 mmol/L (ref 3.5–5.1)
Sodium: 138 mmol/L (ref 135–145)

## 2019-08-17 LAB — BLOOD GAS, ARTERIAL
Acid-base deficit: 1.6 mmol/L (ref 0.0–2.0)
Bicarbonate: 23.4 mmol/L (ref 20.0–28.0)
Drawn by: 519031
FIO2: 40
O2 Saturation: 93.3 %
Patient temperature: 36.7
pCO2 arterial: 44.3 mmHg (ref 32.0–48.0)
pH, Arterial: 7.34 — ABNORMAL LOW (ref 7.350–7.450)
pO2, Arterial: 67.7 mmHg — ABNORMAL LOW (ref 83.0–108.0)

## 2019-08-17 LAB — GLUCOSE, CAPILLARY
Glucose-Capillary: 149 mg/dL — ABNORMAL HIGH (ref 70–99)
Glucose-Capillary: 144 mg/dL — ABNORMAL HIGH (ref 70–99)
Glucose-Capillary: 159 mg/dL — ABNORMAL HIGH (ref 70–99)
Glucose-Capillary: 267 mg/dL — ABNORMAL HIGH (ref 70–99)

## 2019-08-17 MED ORDER — ENOXAPARIN SODIUM 40 MG/0.4ML ~~LOC~~ SOLN
40.0000 mg | SUBCUTANEOUS | Status: DC
  Administered 2019-08-17 – 2019-08-19 (×3): 40 mg via SUBCUTANEOUS
  Filled 2019-08-17 (×3): qty 0.4

## 2019-08-17 MED ORDER — IPRATROPIUM-ALBUTEROL 0.5-2.5 (3) MG/3ML IN SOLN
3.0000 mL | RESPIRATORY_TRACT | Status: DC
  Administered 2019-08-17: 3 mL via RESPIRATORY_TRACT

## 2019-08-17 MED ORDER — IPRATROPIUM-ALBUTEROL 0.5-2.5 (3) MG/3ML IN SOLN: 3.0000 mL | Freq: Three times a day (TID) | RESPIRATORY_TRACT | Status: DC

## 2019-08-17 MED ORDER — ALBUTEROL SULFATE (2.5 MG/3ML) 0.083% IN NEBU
5.0000 mg | INHALATION_SOLUTION | RESPIRATORY_TRACT | Status: DC
  Administered 2019-08-17 – 2019-08-18 (×2): 5 mg via RESPIRATORY_TRACT
  Filled 2019-08-17 (×2): qty 6

## 2019-08-17 MED ORDER — FUROSEMIDE 10 MG/ML IJ SOLN
80.0000 mg | Freq: Two times a day (BID) | INTRAMUSCULAR | Status: AC
  Administered 2019-08-17 (×2): 80 mg via INTRAVENOUS
  Filled 2019-08-17 (×2): qty 8

## 2019-08-17 MED ORDER — FUROSEMIDE 10 MG/ML IJ SOLN
80.0000 mg | Freq: Two times a day (BID) | INTRAMUSCULAR | Status: DC
  Administered 2019-08-18 – 2019-08-19 (×3): 80 mg via INTRAVENOUS
  Filled 2019-08-17 (×3): qty 8

## 2019-08-17 NOTE — Progress Notes (Signed)
Bladder scan done, equals 137 ml

## 2019-08-17 NOTE — Progress Notes (Signed)
Interim Progress Note S Went to check in on patient's respiratory status.  Patient reports feeling tired.  O BP (!) 155/84   Pulse 91   Temp 98 F (36.7 C) (Oral)   Resp 20   Ht 6\' 3"  (1.905 m)   Wt (!) 153.5 kg   SpO2 93%   BMI 42.28 kg/m  General: Patient's appearance seems unchanged.  He is more alert than he was when I saw him on admission. He does fall asleep quickly.  Patient is satting 94% on 3 L nasal cannula. Chest: Prolonged expiratory phase.  Wheezing throughout. Anasarca improved with trace pitting edema to thighs.  Assessment and plan Patient respiratory status has improved from 5 L to 3 L.  He is resting comfortably in bed.  His mental status seems to have improved from how tired he was when he was first admitted.  Given successful diuresis, and continued respiratory support,called respiratory to ask about before and after assessment. Previously assessed and RT reports some mild improvement with neb, but not much. Will schedule albuterol q 4 hours while awake.    Wilber Oliphant, M.D.  6:28 PM 08/17/2019

## 2019-08-17 NOTE — Plan of Care (Signed)

## 2019-08-17 NOTE — Progress Notes (Signed)
RT NOTES:  ABG obtained and sent to lab. Lab tech [employer].

## 2019-08-17 NOTE — Progress Notes (Signed)
Family Medicine Teaching Service Daily Progress Note Intern Pager: [phone]  Patient name: Zachary Young Medical record number: [id number] Date of birth: [birth year] Age: 59 y.o. Gender: male  Primary Care Provider: Patient, No Pcp Per Consultants: N/A Code Status: FULL  Pt Overview and Major Events to Date:  5/1 Admitted  Assessment and Plan: Zachary Young is a 59 y.o. male presenting with CKD. PMH is significant for diabetes, CKD, obesity, CHF, hypertension, hyperlipidemia, coronary artery disease, MI, tobacco misuse. COVID 02 Feb 2019, GI bleed, osteomyelitis post amputation of left & right toes.    Hypoxic respiratory failure likely 2/2 HFrEF vs COPD exacerbation This morning, patient was sleeping without nasal cannula and had desatted to 83% SpO2. He was very sleeping, falling asleep mid-sentence during exam. Despite UOP of 5L yesterday with lasix 80 mg IV and weight loss, now requiring 6L [place] to maintain O2 Sats of 90%. Wheezing improved after albuterol nebulizer, now scheduled q4h. VBG reassuring, not hypercarbic. Plan to try CPAP overnight tonight and assess for improvement. Currently ddx leaning toward COPD exacerbation.  -Continue Lasix 80mg  IV  -Vitals per floor routine, continuous pulse ox  - O2 PRN sats sustained and less than 90% -Strict I's and O's -Sodium restriction -Albuterol nebulizer q4h while awake -2 L fluid restriction  -Daily weights -Am CBC and CMP -Mucinex + Delsym    HFmrEF Last echo 06/22/19 at [facility] during CHF admission showed 45-50% LV EF, though poor visualization of structures. Home medications include lasix 80 mg BID, bumex 1mg  BID, bidil 20-37.5, amlodipine, metoprolol.  - fluid restriction < 2L  - continue bidil (hold home dose lasix & bumex).  -Continue lasix as above   Anasarca, testicular swelling Both are most likely due to volume overload in the setting of CHF and intake of excess sodium per patient. Albumin 2.4. Appropriate response to lasix  as above, decrease in swelling today. -Strict I's and O's -Daily weights -<2g Sodium restriction -2 L fluid restriction -Daily BMP with diuresis   Left Lateral Abdominal pain- resolving Abdominal U/S:Very limited exam. No definite acute abnormality identified sonographically.The proximal abdominal aorta measures 2.9 cm in diameter. Ectatic abdominal aorta at risk for aneurysm development. Cause of abdominal pain unclear, diminished erythema today. -Tylenol 650mg  Q6H   Chest pain. Resolved. -Monitor chest pain -Low threshold to repeat troponins given history of MI  CKD IV Cr improved to 3.93 from 4.06. Follows with Dr. Radene Knee, last visit 07/20/19. Nephrology did not feel he needed dialysis at the time. Does not appear uremic. BUN chronically in the 30's. UPC on 08/10/19 9.364.  Renal US 06/21/19 with normal R kidney, limited view L kidney, and PVR 315. Appropriate UOP with lasix, will continue to monitor.  -Monitor creatinine, Mg, Phos -Consider Nephrology consult if kidney function worsens - continue home phoslo 1334 TID   Coronary artery disease, MI  Per [facility] notes patient sees cardiologist in [place]. - continue home ASA 81  -Monitor for chest pain   Diabetes CBG on admission 397 now 152, last A1c 8.5 Home meds: Lantus 10 units once daily, Humalog 3 times daily with meals, victoza 1.2 mg daily  -Sensitive sliding scale -Monitor CBGs 3 times daily and bedtime  Hypertension BP today 148/87 Home meds: Amlodipine 10 mg once daily, metoprolol 100 mg daily -Continue amlodipine 10 mg  -Continue Metoprolol 100mg  once daily   Hyperlipidemia Tot chol 115, HDL 41, LDL 50. ASCVD risk 25%. -Atorvastatin 80 mg once daily  Tobacco misuse disorder Smokes 1 pack every 2  days, chronic smoker  -Nicotine patch 12mg   -Encourage smoking cessation   Penile Lesion  Small irregular lesion on dorsal aspect of body of penis. Likely self inflicted given personal account of pruritis.   -Monitor area -hydrocortisone cream for pruritis.   FEN/GI: Heart healthy diet, sodium restriction, fluid restriction 2045ml  Prophylaxis: Lovenox 30mg    Disposition: Likely home in 1-2 days pending symptom improvement with continued medical treatment.  Subjective:  Coughing, sleepy, wheezing. Improved with O2 [place] and nebulizer.  Objective: Temp:  [98 F (36.7 C)-99.1 F (37.3 C)] 98 F (36.7 C) (05/03 1432) Pulse Rate:  [89-98] 91 (05/03 1657) Resp:  [18-22] 20 (05/03 1657) BP: (123-159)/(69-94) 155/84 (05/03 1657) SpO2:  [88 %-99 %] 93 % (05/03 1657) Weight:  [153.5 kg] 153.5 kg (05/03 0455)   Physical Exam:  General: Appears sleepy, no acute distress. Age appropriate. Cardiac: RRR, faint heart sounds, no murmurs Respiratory: crackles at lung bases bilaterally, normal effort, prolonged expiratory wheezing Abdomen: soft, nontender, markedly distended, +bowel sounds Extremities: Severe LE edema  Skin: Warm and dry, LE venous stasis skin changes Neuro: alert and oriented x4  Laboratory: Recent Labs  Lab 08/15/19 0944 08/16/19 0633  WBC 10.2 8.2  HGB 10.5* 9.0*  HCT 33.7* 28.8*  PLT 380 342   Recent Labs  Lab 08/15/19 0944 08/16/19 0633 08/17/19 0608  NA 140 140 138  K 4.3 4.2 4.6  CL 110 110 107  CO2 16* 21* 20*  BUN 37* 41* 41*  CREATININE 4.09* 4.06* 3.93*  CALCIUM 8.2* 8.0* 7.9*  PROT 7.5 6.4*  --   BILITOT 1.2 0.8  --   ALKPHOS 106 96  --   ALT 14 13  --   AST 18 15  --   GLUCOSE 78 110* 152*    Imaging/Diagnostic Tests:  CHEST - 2 VIEW COMPARISON:  December 14, 2018 IMPRESSION: Bilateral pulmonary opacities could represent pulmonary edema given history. However, an infectious process, including atypical infections, is not excluded on this study. There is no cardiomegaly.  PORTABLE CHEST 1 VIEW COMPARISON:  Portable exam 1123 hours compared to 05/15/2019 IMPRESSION: Probable pulmonary edema/CHF as above.  ABDOMEN ULTRASOUND  COMPLETE COMPARISON:  None. IMPRESSION: 1. Very limited exam. No definite acute abnormality identified sonographically. 2. The proximal abdominal aorta measures 2.9 cm in diameter. Ectatic abdominal aorta at risk for aneurysm development. Recommend followup by ultrasound in 5 years. This recommendation follows ACR consensus guidelines: White Paper of the ACR Incidental Findings Committee II on Vascular Findings. J Am Coll Radiol 2013; 10:789-794.  Gladys Damme, MD 08/17/2019, 7:21 PM PGY-1, [place] Intern pager: [phone], text pages welcome

## 2019-08-17 NOTE — Progress Notes (Signed)
Pt refusing bed alarm, educated

## 2019-08-18 DIAGNOSIS — J9601 Acute respiratory failure with hypoxia: Secondary | ICD-10-CM | POA: Diagnosis not present

## 2019-08-18 DIAGNOSIS — R601 Generalized edema: Secondary | ICD-10-CM | POA: Diagnosis not present

## 2019-08-18 DIAGNOSIS — N184 Chronic kidney disease, stage 4 (severe): Secondary | ICD-10-CM | POA: Diagnosis not present

## 2019-08-18 DIAGNOSIS — I5043 Acute on chronic combined systolic (congestive) and diastolic (congestive) heart failure: Secondary | ICD-10-CM | POA: Diagnosis not present

## 2019-08-18 LAB — CBC
HCT: 25.9 % — ABNORMAL LOW (ref 39.0–52.0)
Hemoglobin: 8.1 g/dL — ABNORMAL LOW (ref 13.0–17.0)
MCH: 28.4 pg (ref 26.0–34.0)
MCHC: 31.3 g/dL (ref 30.0–36.0)
MCV: 90.9 fL (ref 80.0–100.0)
Platelets: 288 10*3/uL (ref 150–400)
RBC: 2.85 MIL/uL — ABNORMAL LOW (ref 4.22–5.81)
RDW: 16.7 % — ABNORMAL HIGH (ref 11.5–15.5)
WBC: 4.3 10*3/uL (ref 4.0–10.5)
nRBC: 0 % (ref 0.0–0.2)

## 2019-08-18 LAB — GLUCOSE, CAPILLARY
Glucose-Capillary: 231 mg/dL — ABNORMAL HIGH (ref 70–99)
Glucose-Capillary: 209 mg/dL — ABNORMAL HIGH (ref 70–99)
Glucose-Capillary: 220 mg/dL — ABNORMAL HIGH (ref 70–99)
Glucose-Capillary: 237 mg/dL — ABNORMAL HIGH (ref 70–99)

## 2019-08-18 LAB — BASIC METABOLIC PANEL
Anion gap: 11 (ref 5–15)
BUN: 42 mg/dL — ABNORMAL HIGH (ref 6–20)
CO2: 23 mmol/L (ref 22–32)
Calcium: 8 mg/dL — ABNORMAL LOW (ref 8.9–10.3)
Chloride: 103 mmol/L (ref 98–111)
Creatinine, Ser: 4.11 mg/dL — ABNORMAL HIGH (ref 0.61–1.24)
GFR calc Af Amer: 17 mL/min — ABNORMAL LOW (ref >60)
GFR calc non Af Amer: 15 mL/min — ABNORMAL LOW (ref >60)
Glucose, Bld: 226 mg/dL — ABNORMAL HIGH (ref 70–99)
Potassium: 4.2 mmol/L (ref 3.5–5.1)
Sodium: 137 mmol/L (ref 135–145)

## 2019-08-18 MED ORDER — INSULIN GLARGINE 100 UNIT/ML ~~LOC~~ SOLN
10.0000 [IU] | Freq: Every day | SUBCUTANEOUS | Status: DC
  Administered 2019-08-18 – 2019-08-19 (×2): 10 [IU] via SUBCUTANEOUS
  Filled 2019-08-18 (×3): qty 0.1

## 2019-08-18 MED ORDER — ALBUTEROL SULFATE (2.5 MG/3ML) 0.083% IN NEBU: 5.0000 mg | INHALATION_SOLUTION | RESPIRATORY_TRACT | Status: DC | PRN

## 2019-08-18 MED ORDER — ALBUTEROL SULFATE (2.5 MG/3ML) 0.083% IN NEBU
5.0000 mg | INHALATION_SOLUTION | RESPIRATORY_TRACT | Status: DC
  Administered 2019-08-18: 5 mg via RESPIRATORY_TRACT
  Filled 2019-08-18 (×3): qty 6

## 2019-08-18 NOTE — Progress Notes (Signed)
RT NOTES: Administered albuterol breathing treatment to patient and patient complained stating his food was going to get cold and he would rather eat. Refused to finish breathing treatment. Removed nebulizer mask and placed back on oxygen at this time.

## 2019-08-18 NOTE — Progress Notes (Addendum)
Family Medicine Teaching Service Daily Progress Note Intern Pager: [phone]  Patient name: Zachary Young Medical record number: [id number] Date of birth: 12-20-1960 Age: 59 y.o. Gender: male  Primary Care Provider: Patient, No Pcp Per Consultants: N/A Code Status: FULL  Pt Overview and Major Events to Date:  5/1 Admitted  Assessment and Plan: Zachary Young is a 59 y.o. male presenting with CKD. PMH is significant for diabetes, CKD, obesity, CHF, hypertension, hyperlipidemia, coronary artery disease, MI, tobacco misuse. COVID 02 Feb 2019, GI bleed, osteomyelitis post amputation of left & right toes.    Hypoxic respiratory failure likely 2/2 HFrEF vs COPD exacerbation Patient attempted CPAP last night, was not able to tolerate. Started scheduled albuterol nebulizer tx q4h yesterday, with improvement in wheezing. Now requiring only 1L [place] O2 to maintain sats of ~90% SpO2. CXR yesterday with persistent cardiomegaly with pulmonary vascular congestion, suspect pulmonary edema, but could be superimposed pneumonia. UOP on Lasix IV 80 mg 3.15L yesterday, weight decreased from 153.5kg to 150.5kg today. Suspect CHF is still likely ddx and despite adequate response to lasix, patient is just that fluid overloaded as he has improved with little intervention other than continued diuresis and CXR with fluid overload. -Continue Lasix 80mg  IV -Vitals per floor routine, continuous pulse ox  - O2 PRN sats sustained at 90% -Strict I's and O's -Sodium restriction -Albuterol nebulizer q4h while awake -2 L fluid restriction  -Daily weights -Am CBC and CMP -Mucinex + Delsym   -Consult TOC for housing instability  HFmrEF Last echo 06/22/19 at [facility] during CHF admission showed 45-50% LV EF, though poor visualization of structures. Home medications include lasix 80 mg BID, bumex 1mg  BID, bidil 20-37.5, amlodipine, metoprolol.  - fluid restriction < 2L  - continue bidil (hold home dose lasix & bumex).  -Continue  lasix as above   Anemia Hgb decreased to 8.1 today from 9 yesterday. Baseline hgb around 10-11. 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 Will continue to monitor and if hgb drops below 8, will transfuse as patient has history of CAD. -Daily CBC - Transfuse if hgb <8  CKD IV Cr improved worsened from 3.93 to 4.11, still around baseline. Follows with Dr. Radene Knee, last visit 07/20/19. Nephrology did not feel he needed dialysis at the time. Does not appear uremic. BUN chronically in the 30's. UPC on 08/10/19 9.364.  Renal US 06/21/19 with normal R kidney, limited view L kidney, and PVR 315. Appropriate UOP with lasix, will continue to monitor.  -Monitor creatinine, Mg, Phos -Consider Nephrology consult if kidney function worsens - continue home phoslo 1334 TID   Anasarca, testicular swelling Both are most likely due to volume overload in the setting of CHF and intake of excess sodium per patient. Albumin 2.4. Appropriate response to lasix as above, continued decrease in swelling today. Vascular US with enlarged groin lymph nodes. -Strict I's and O's -Daily weights -<2g Sodium restriction -2 L fluid restriction -Daily BMP with diuresis   Left Lateral Abdominal pain- resolved Abdominal U/S:Very limited exam. No definite acute abnormality identified sonographically.The proximal abdominal aorta measures 2.9 cm in diameter. Ectatic abdominal aorta at risk for aneurysm development. Cause of abdominal pain unclear, erythema resolved. -Tylenol 650mg  Q6H   Chest pain. Resolved. -Monitor chest pain -Low threshold to repeat troponins given history of MI  Coronary artery disease, MI  Per [facility] notes patient sees cardiologist in [place]. - continue home ASA 81  -Monitor for chest pain   Diabetes CBG on admission 397, overnight 267 now  226, last A1c 8.5. Home medication is lantus 10U, plan to start lantus today. Home meds: Lantus 10 units once daily, Humalog 3 times daily with meals, victoza 1.2  mg daily  -Sensitive sliding scale -Monitor CBGs 3 times daily and bedtime -Lantus 10U  Hypertension BP today 111/60 Home meds: Amlodipine 10 mg once daily, metoprolol 100 mg daily -Continue amlodipine 10 mg  -Continue Metoprolol 100mg  once daily   Hyperlipidemia Tot chol 115, HDL 41, LDL 50. ASCVD risk 25%. -Atorvastatin 80 mg once daily  Tobacco misuse disorder Smokes 1 pack every 2 days, chronic smoker  -Nicotine patch 12mg   -Encourage smoking cessation   Penile Lesion  Small irregular lesion on dorsal aspect of body of penis. Likely self inflicted given personal account of pruritis.  -Monitor area -hydrocortisone cream for pruritis.   FEN/GI: Heart healthy diet, sodium restriction, fluid restriction 2043ml  Prophylaxis: Lovenox 30mg    Disposition: Likely home in 1-2 days pending symptom improvement with continued medical treatment.  Subjective:  Much more awake and alert today. Clinically improving. Needs assistance with finding a new place to live as his brother is kicking him out. Nose bleed last night with [place], now with humidifier.  Objective: Temp:  [98 F (36.7 C)-98.4 F (36.9 C)] 98.4 F (36.9 C) (05/04 0547) Pulse Rate:  [89-98] 89 (05/04 0547) Resp:  [16-20] 16 (05/04 0547) BP: (111-159)/(60-94) 111/60 (05/04 0547) SpO2:  [92 %-99 %] 98 % (05/04 0547) Weight:  [150.5 kg] 150.5 kg (05/04 0547)   Physical Exam:  General: Alert and active, improved appearance, no acute distress. Age appropriate. Cardiac: RRR, faint heart sounds, no murmurs Respiratory: crackles at lung bases bilaterally, normal effort, prolonged expiratory wheezing Abdomen: soft, nontender, markedly distended, +bowel sounds Extremities: 2+ LE edema  Skin: Warm and dry, LE venous stasis skin changes Neuro: alert and oriented x4  Laboratory: Recent Labs  Lab 08/15/19 0944 08/16/19 0633 08/18/19 0526  WBC 10.2 8.2 4.3  HGB 10.5* 9.0* 8.1*  HCT 33.7* 28.8* 25.9*  PLT 380 342  288   Recent Labs  Lab 08/15/19 0944 08/16/19 0633 08/17/19 0608  NA 140 140 138  K 4.3 4.2 4.6  CL 110 110 107  CO2 16* 21* 20*  BUN 37* 41* 41*  CREATININE 4.09* 4.06* 3.93*  CALCIUM 8.2* 8.0* 7.9*  PROT 7.5 6.4*  --   BILITOT 1.2 0.8  --   ALKPHOS 106 96  --   ALT 14 13  --   AST 18 15  --   GLUCOSE 78 110* 152*    Imaging/Diagnostic Tests:  CHEST - 2 VIEW COMPARISON:  December 14, 2018 IMPRESSION: Bilateral pulmonary opacities could represent pulmonary edema given history. However, an infectious process, including atypical infections, is not excluded on this study. There is no cardiomegaly.  PORTABLE CHEST 1 VIEW COMPARISON:  Portable exam 1123 hours compared to 05/15/2019 IMPRESSION: Probable pulmonary edema/CHF as above.  ABDOMEN ULTRASOUND COMPLETE COMPARISON:  None. IMPRESSION: 1. Very limited exam. No definite acute abnormality identified sonographically. 2. The proximal abdominal aorta measures 2.9 cm in diameter. Ectatic abdominal aorta at risk for aneurysm development. Recommend followup by ultrasound in 5 years. This recommendation follows ACR consensus guidelines: White Paper of the ACR Incidental Findings Committee II on Vascular Findings. J Am Coll Radiol 2013; 10:789-794.  DG Chest 2 View  Result Date: 08/17/2019 CLINICAL DATA:  Shortness of breath and wheezing EXAM: CHEST - 2 VIEW COMPARISON:  Aug 15, 2019. FINDINGS: There is cardiomegaly with pulmonary venous  hypertension. Ill-defined opacity noted bilaterally with areas of interstitial thickening as well as patchy alveolar opacity. No adenopathy. No bone lesions. IMPRESSION: Persistent cardiomegaly with pulmonary vascular congestion. Suspect pulmonary edema, although superimposed pneumonia cannot be excluded. Appearance similar to 2 days prior. Electronically Signed   By: Lowella Grip III M.D.   On: 08/17/2019 12:17   Gladys Damme, MD 08/18/2019, 6:21 AM PGY-1, [place] Intern pager: [phone], text pages welcome

## 2019-08-18 NOTE — Progress Notes (Signed)
Patent is a high fall risk. Refusing bed alarm. Patient is alert and oriented and was educated by this Therapist, sports.

## 2019-08-18 NOTE — Plan of Care (Signed)
  Problem: Education: Goal: Knowledge of General Education information will improve Description: Including pain rating scale, medication(s)/side effects and non-pharmacologic comfort measures Outcome: Progressing   Problem: Clinical Measurements: Goal: Ability to maintain clinical measurements within normal limits will improve Outcome: Progressing   Problem: Clinical Measurements: Goal: Respiratory complications will improve Outcome: Progressing   

## 2019-08-18 NOTE — Progress Notes (Signed)
Patient wakes up and states he feels dizzy, RN came to room and patient is sleeping. He did not tolerate CPAP last night. VSS.

## 2019-08-18 NOTE — Evaluation (Signed)
Physical Therapy Evaluation Patient Details Name: Zachary Young MRN: [id number] DOB: Aug 10, 1960 Today's Date: 08/18/2019   History of Present Illness  59 y.o. male presenting with CKD. PMH is significant for diabetes, CKD, obesity, CHF, hypertension, hyperlipidemia, coronary artery disease, MI, tobacco misuse. COVID 02 Feb 2019, GI bleed, osteomyelitis post amputation of left & right toes. Came to ED with acute worsening DoE and LE edema. Admitted 08/15/19 for treatment of Acute on chronic combined systolic and diastolic heart failure.   Clinical Impression  Immediately prior to admission, pt living in hotel, however before that he was living with his brother. Pt reports brother "put me out." Pt reports being able to ambulate with cane, and requiring help for ADLs and iADLs. Pt is currently limited in safe mobility by increased O2 demand (see General Comments), decreased safety awareness especially with use of RW in the presence of decreased strength, balance and endurance. Pt is mod I for bed mobility, min guard for transfers and min A and maximal cuing for safe use of RW with ambulation of 12 feet. PT recommending HHPT level rehab at discharge to increase safe mobility. PT will continue to follow acutely.     Follow Up Recommendations Home health PT;Supervision for mobility/OOB    Equipment Recommendations  Rolling walker with 5" wheels    Recommendations for Other Services OT consult     Precautions / Restrictions Precautions Precautions: Fall Restrictions Weight Bearing Restrictions: No      Mobility  Bed Mobility Overal bed mobility: Modified Independent             General bed mobility comments: flattened bed with use of bed rails  Transfers Overall transfer level: Needs assistance Equipment used: Rolling walker (2 wheeled) Transfers: Sit to/from Stand Sit to Stand: From elevated surface;Min guard         General transfer comment: min guard for safety, able to power  up before reaching to RW for steadying  Ambulation/Gait Ambulation/Gait assistance: Min assist Gait Distance (Feet): 12 Feet Assistive device: Rolling walker (2 wheeled) Gait Pattern/deviations: Step-through pattern;Decreased step length - right;Decreased step length - left;Narrow base of support;Shuffle Gait velocity: slowed Gait velocity interpretation: <1.31 ft/sec, indicative of household ambulator General Gait Details: min A for steadying with RW, very wide BoS, pt unable to ambulate within RW, causing increased forward flexion, despite multimodal cuing, for proximity and upright posture pt unable to achieve, decreased safety awareness trying to lift RW up around foot of bed instead of walking around it      Balance Overall balance assessment: Needs assistance Sitting-balance support: Feet supported;No upper extremity supported Sitting balance-Leahy Scale: Good     Standing balance support: Single extremity supported;During functional activity;No upper extremity supported;Bilateral upper extremity supported Standing balance-Leahy Scale: Poor                               Pertinent Vitals/Pain Pain Assessment: Faces Faces Pain Scale: Hurts a little bit Pain Location: generalized with movement  Pain Descriptors / Indicators: Grimacing;Guarding Pain Intervention(s): Limited activity within patient's tolerance;Monitored during session;Repositioned    Home Living Family/patient expects to be discharged to:: Unsure                 Additional Comments: pt was living with his brother however pt reports his brother is "putting me out." Pt would like assistance from SW in finding housing however     Prior Function Level of Independence: Needs  assistance   Gait / Transfers Assistance Needed: ambulates household distances with cane  ADL's / Homemaking Assistance Needed: requires assist for bathing and dressing, family was providing assist with iADLs            Extremity/Trunk Assessment   Upper Extremity Assessment Upper Extremity Assessment: Overall WFL for tasks assessed    Lower Extremity Assessment Lower Extremity Assessment: RLE deficits/detail;LLE deficits/detail RLE Deficits / Details: transmetatarsal amputation, knee lacks full extension, strength grossly 4/5 RLE Sensation: decreased light touch RLE Coordination: decreased fine motor LLE Deficits / Details: transmetatarsal amputation, knee lacks full extension, strength grossly 4/5 LLE Sensation: decreased light touch LLE Coordination: decreased fine motor       Communication   Communication: No difficulties  Cognition Arousal/Alertness: Awake/alert Behavior During Therapy: WFL for tasks assessed/performed;Impulsive Overall Cognitive Status: No family/caregiver present to determine baseline cognitive functioning Area of Impairment: Following commands;Problem solving;Safety/judgement                       Following Commands: Follows multi-step commands inconsistently;Follows one step commands inconsistently Safety/Judgement: Decreased awareness of safety   Problem Solving: Requires verbal cues;Requires tactile cues General Comments: pt is impulsive in his movement, does not follow instructions for safe RW advancement, takes hand of RW and uses foot board, instead of navigating around obstacles he picks the RW up and over them       General Comments General comments (skin integrity, edema, etc.): Pt on 1.5 L O2 on entry with SaO2 92%O2, ambulated without O2 and SaO2 dropped to 85%O2, replaced 2L O2 via , vc for pursed lip breathing pt able to rebound to 90%O2, left on 2L O2     Assessment/Plan    PT Assessment Patient needs continued PT services  PT Problem List Decreased strength;Decreased activity tolerance;Decreased balance;Decreased mobility;Decreased cognition;Decreased safety awareness;Decreased knowledge of use of DME;Cardiopulmonary status limiting  activity;Impaired sensation       PT Treatment Interventions DME instruction;Gait training;Functional mobility training;Therapeutic activities;Therapeutic exercise;Balance training;Cognitive remediation;Patient/family education    PT Goals (Current goals can be found in the Care Plan section)  Acute Rehab PT Goals Patient Stated Goal: find some place to live PT Goal Formulation: With patient Time For Goal Achievement: 09/01/19 Potential to Achieve Goals: Fair    Frequency Min 3X/week    AM-PAC PT "6 Clicks" Mobility  Outcome Measure Help needed turning from your back to your side while in a flat bed without using bedrails?: None Help needed moving from lying on your back to sitting on the side of a flat bed without using bedrails?: A Little Help needed moving to and from a bed to a chair (including a wheelchair)?: None Help needed standing up from a chair using your arms (e.g., wheelchair or bedside chair)?: None Help needed to walk in hospital room?: A Little Help needed climbing 3-5 steps with a railing? : A Lot 6 Click Score: 20    End of Session Equipment Utilized During Treatment: Gait belt;Oxygen Activity Tolerance: Patient tolerated treatment well Patient left: in chair;with call bell/phone within reach Nurse Communication: Mobility status PT Visit Diagnosis: Unsteadiness on feet (R26.81);Other abnormalities of gait and mobility (R26.89);Muscle weakness (generalized) (M62.81);Difficulty in walking, not elsewhere classified (R26.2)    Time: 5102-5852 PT Time Calculation (min) (ACUTE ONLY): 23 min   Charges:   PT Evaluation $PT Eval Moderate Complexity: 1 Mod PT Treatments $Gait Training: 8-22 mins        Yonna Alwin B. Migdalia Dk PT, DPT Acute  Rehabilitation Services Pager [phone] Office [phone]   [place] 08/18/2019, 9:47 AM

## 2019-08-18 NOTE — Progress Notes (Signed)
RT NOTES: Patient refusing breathing treatments.

## 2019-08-18 NOTE — Discharge Summary (Shared)
Family Medicine Teaching Service ?Hospital Discharge Summary ? ?Patient name: Zachary Young Medical record number: [id number] ?Date of birth: 13-Nov-1960 Age: 59 y.o. Gender: male ?Date of Admission: 08/15/2019  Date of Discharge: *** ?Admitting Physician: Blane Ohara McDiarmid, MD ? ?Primary Care Provider: Patient, No Pcp Per ?Consultants: *** ? ?Indication for Hospitalization: *** ? ?Discharge Diagnoses/Problem List:  ?*** ? ?Disposition: *** ? ?Discharge Condition: *** ? ?Discharge Exam: *** ? ?Brief Hospital Course:  ?*** ? ?Issues for Follow Up:  ?1. *** ? ?Significant Procedures: *** ? ?Significant Labs and Imaging:  ?Recent Labs  ?Lab 08/15/19 ?4097 08/16/19 ?3532 08/18/19 ?9924  ?WBC 10.2 8.2 4.3  ?HGB 10.5* 9.0* 8.1*  ?HCT 33.7* 28.8* 25.9*  ?PLT 380 342 288  ? ?Recent Labs  ?Lab 08/15/19 ?2683 08/15/19 ?4196 08/15/19 ?1806 08/16/19 ?2229 08/16/19 ?7989 08/17/19 ?2119 08/18/19 ?4174  ?NA 140  --   --  140  --  138 137  ?K 4.3   < >  --  4.2   < > 4.6 4.2  ?CL 110  --   --  110  --  107 103  ?CO2 16*  --   --  21*  --  20* 23  ?GLUCOSE 78  --   --  110*  --  152* 226*  ?BUN 37*  --   --  41*  --  41* 42*  ?CREATININE 4.09*  --   --  4.06*  --  3.93* 4.11*  ?CALCIUM 8.2*  --   --  8.0*  --  7.9* 8.0*  ?MG  --   --  1.8  --   --   --   --   ?PHOS  --   --  5.1*  --   --   --   --   ?ALKPHOS 106  --   --  96  --   --   --   ?AST 18  --   --  15  --   --   --   ?ALT 14  --   --  13  --   --   --   ?ALBUMIN 2.4*  --   --  2.1*  --   --   --   ? < > = values in this interval not displayed.  ? ? ?*** ? ?Results/Tests Pending at Time of Discharge: *** ? ?Discharge Medications:  ?Allergies as of 08/18/2019   ?   Reactions  ? Bee Venom Anaphylaxis  ? Per pt, nearly died from bee sting as a child  ? Propofol Other (See Comments)  ?  Hallucinations  ? Eggs Or Egg-derived Products Nausea And Vomiting, Other (See Comments)  ? Stomach cramps  ? Morphine Nausea And Vomiting  ? Oxycodone Nausea And Vomiting  ?  ? Med Rec must be completed prior to using this [place]*** ? ? ? ? ? ? ?D

## 2019-08-18 NOTE — Procedures (Signed)
RT notes:  Patient continues to refuse treatments, stating they make him gag.  Changed treatments to prn.  Patient also states he will not wear the CPAP for tonight.

## 2019-08-18 NOTE — Progress Notes (Signed)
RT Note: Placed patient on CPAP per order via nasal mask 10.0 cm H20. Patient unable to tolerate. Decreased pressure by 2cm down to 5cm for patient comfort. Patient unable to tolerate. Pulled mask off, stating he is not able to wear the mask over his nose.  Attempted FFM, auto titrate mode, and CPAP mode titrating pressure for patient comfort. Patient still unable to tolerate and states he feels like he is going to vomit. Removed mask, placed on 4L [place]. SPO2 currently at 96%. RN aware.

## 2019-08-19 LAB — GLUCOSE, CAPILLARY
Glucose-Capillary: 199 mg/dL — ABNORMAL HIGH (ref 70–99)
Glucose-Capillary: 239 mg/dL — ABNORMAL HIGH (ref 70–99)
Glucose-Capillary: 198 mg/dL — ABNORMAL HIGH (ref 70–99)
Glucose-Capillary: 116 mg/dL — ABNORMAL HIGH (ref 70–99)

## 2019-08-19 LAB — CBC
HCT: 27.5 % — ABNORMAL LOW (ref 39.0–52.0)
Hemoglobin: 8.5 g/dL — ABNORMAL LOW (ref 13.0–17.0)
MCH: 28.4 pg (ref 26.0–34.0)
MCHC: 30.9 g/dL (ref 30.0–36.0)
MCV: 92 fL (ref 80.0–100.0)
Platelets: 308 10*3/uL (ref 150–400)
RBC: 2.99 MIL/uL — ABNORMAL LOW (ref 4.22–5.81)
RDW: 16.5 % — ABNORMAL HIGH (ref 11.5–15.5)
WBC: 5.1 10*3/uL (ref 4.0–10.5)
nRBC: 0 % (ref 0.0–0.2)

## 2019-08-19 LAB — BASIC METABOLIC PANEL
Anion gap: 10 (ref 5–15)
BUN: 41 mg/dL — ABNORMAL HIGH (ref 6–20)
CO2: 23 mmol/L (ref 22–32)
Calcium: 8.5 mg/dL — ABNORMAL LOW (ref 8.9–10.3)
Chloride: 106 mmol/L (ref 98–111)
Creatinine, Ser: 3.83 mg/dL — ABNORMAL HIGH (ref 0.61–1.24)
GFR calc Af Amer: 19 mL/min — ABNORMAL LOW (ref >60)
GFR calc non Af Amer: 16 mL/min — ABNORMAL LOW (ref >60)
Glucose, Bld: 196 mg/dL — ABNORMAL HIGH (ref 70–99)
Potassium: 4.4 mmol/L (ref 3.5–5.1)
Sodium: 139 mmol/L (ref 135–145)

## 2019-08-19 MED ORDER — AEROCHAMBER PLUS FLO-VU LARGE MISC
1.0000 | Freq: Once | Status: DC
  Filled 2019-08-19: qty 1

## 2019-08-19 MED ORDER — TORSEMIDE 20 MG PO TABS
80.0000 mg | ORAL_TABLET | Freq: Every day | ORAL | Status: DC
  Administered 2019-08-19 – 2019-08-20 (×2): 80 mg via ORAL
  Filled 2019-08-19 (×2): qty 4

## 2019-08-19 MED ORDER — ALBUTEROL SULFATE (2.5 MG/3ML) 0.083% IN NEBU: 2.5000 mg | INHALATION_SOLUTION | RESPIRATORY_TRACT | Status: DC | PRN

## 2019-08-19 MED ORDER — ALBUTEROL SULFATE HFA 108 (90 BASE) MCG/ACT IN AERS
2.0000 | INHALATION_SPRAY | Freq: Four times a day (QID) | RESPIRATORY_TRACT | Status: DC
  Administered 2019-08-19: 2 via RESPIRATORY_TRACT
  Filled 2019-08-19: qty 6.7

## 2019-08-19 MED ORDER — TORSEMIDE 20 MG PO TABS: 80.0000 mg | ORAL_TABLET | Freq: Every day | ORAL | Status: DC

## 2019-08-19 NOTE — Progress Notes (Signed)
Family Medicine Teaching Service Daily Progress Note Intern Pager: [phone]  Patient name: Zachary Young Medical record number: [id number] Date of birth: 1960-07-21 Age: 59 y.o. Gender: male  Primary Care Provider: Patient, No Pcp Per Consultants: N/A Code Status: FULL  Pt Overview and Major Events to Date:  5/1 Admitted  Assessment and Plan: Zachary Young is a 59 y.o. male presenting with CKD. PMH is significant for diabetes, CKD, obesity, CHF, hypertension, hyperlipidemia, coronary artery disease, MI, tobacco misuse. COVID 02 Feb 2019, GI bleed, osteomyelitis post amputation of left & right toes.    Hypoxic respiratory failure likely 2/2 HFrEF vs COPD exacerbation Patient does not tolerate CPAP, will do albuterol treatments. Patient still has prolonged expiratory phase with wheezing this morning. Will attempt to obtain albuterol MDI instead of nebulizer as patient tolerates this better. Now requiring only 1L [place] O2 to maintain sats of ~88-90% SpO2. Plan to ambulate patient with pulse oximetry to determine home O2 requirement or not, pending that, patient is medically stable for discharge. SW assisting with housing as patient has complex social situation. CXR yesterday read as persistent cardiomegaly with pulmonary vascular congestion, suspect pulmonary edema, but could be superimposed pneumonia. Clinically, do not suspect pneumonia due to afebrile, VSS, no leukocytosis. Patient has improved with diuresis and albuterol, however, which increases my suspicion that this is a mixed picture of CHF with likely some pulmonary obstructive disease as patient has a long smoking history. UOP on Lasix IV 80 mg 3.7L yesterday, weight decreased from 150.5kg to 149.9 kg today.  -Switch to torsemide 80 mg daily PO -Vitals per floor routine, continuous pulse ox  - O2 PRN sats sustained at 90% -Strict I's and O's -Sodium restriction -Albuterol MDI q4h -2 L fluid restriction  -Daily weights -Am CBC and  CMP -Mucinex + Delsym   -Consult TOC for housing instability  HFmrEF Last echo 06/22/19 at [facility] during CHF admission showed 45-50% LV EF, though poor visualization of structures. Home medications include lasix 80 mg BID, bumex 1mg  BID, bidil 20-37.5, amlodipine, metoprolol.  - fluid restriction < 2L  - continue bidil (hold home dose lasix & bumex).  -Continue lasix as above   Anemia Hgb stable at 8.5 from 8.1 yesterday. Baseline hgb around 10-11. 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 Will continue to monitor and if hgb drops below 8, will transfuse as patient has history of CAD. -Daily CBC -Transfuse if hgb <8  CKD IV Cr improved overall: baseline is 3-4, cr today 3.8. Follows with Dr. Radene Knee, last visit 07/20/19. Nephrology did not feel he needed dialysis at the time. Does not appear uremic. BUN chronically in the 30's. UPC on 08/10/19 9.364.  Renal US 06/21/19 with normal R kidney, limited view L kidney, and PVR 315. Appropriate UOP with lasix, will continue to monitor.  -Monitor creatinine - continue home phoslo 1334 TID   Anasarca, testicular swelling Both are most likely due to volume overload in the setting of CHF and intake of excess sodium per patient. Albumin 2.4. Appropriate response to lasix as above, continued decrease in swelling today. Vascular US with enlarged groin lymph nodes. -Strict I's and O's -Daily weights -<2g Sodium restriction -2 L fluid restriction -Daily BMP with diuresis   Left Lateral Abdominal pain- resolved Abdominal U/S:Very limited exam. No definite acute abnormality identified sonographically.The proximal abdominal aorta measures 2.9 cm in diameter. Ectatic abdominal aorta at risk for aneurysm development. Cause of abdominal pain unclear, erythema resolved. -Tylenol 650mg  Q6H   Chest pain. Resolved. -  Monitor chest pain -Low threshold to repeat troponins given history of MI  Coronary artery disease, MI  Per [facility] notes patient sees cardiologist in  [place]. - continue home ASA 81  -Monitor for chest pain   Diabetes CBG on admission 397, overnight 209-199, last A1c 8.5. Home medication is lantus 10U, started lantus 10U yesterday. Home meds: Lantus 10 units once daily, Humalog 3 times daily with meals, victoza 1.2 mg daily  -Sensitive sliding scale -Monitor CBGs 3 times daily and bedtime -Lantus 10U  Hypertension BP today 130/67 Home meds: Amlodipine 10 mg once daily, metoprolol 100 mg daily -Continue amlodipine 10 mg  -Continue Metoprolol 100mg  once daily   Hyperlipidemia Tot chol 115, HDL 41, LDL 50. ASCVD risk 25%. -Atorvastatin 80 mg once daily  Tobacco misuse disorder Smokes 1 pack every 2 days, chronic smoker  -Nicotine patch 12mg   -Encourage smoking cessation   Penile Lesion  Small irregular lesion on dorsal aspect of body of penis. Likely self inflicted given personal account of pruritis.  -Monitor area -hydrocortisone cream for pruritis.   FEN/GI: Heart healthy diet, sodium restriction, fluid restriction 2031ml  Prophylaxis: Lovenox 30mg    Disposition: Likely home in 1-2 days pending symptom improvement with continued medical treatment.  Subjective:  Patient agreeable to use albuterol treatments, but not CPAP. Patient anxious to go home, but needs help with housing resources. CSW to see patient today.   Objective: Temp:  [98 F (36.7 C)-98.6 F (37 C)] 98.1 F (36.7 C) (05/05 0519) Pulse Rate:  [87-90] 90 (05/05 0519) Resp:  [16-20] 20 (05/05 0519) BP: (116-169)/(67-91) 130/67 (05/05 0519) SpO2:  [92 %-99 %] 96 % (05/05 0519) Weight:  [149.9 kg] 149.9 kg (05/05 0519)   Physical Exam:  General: Alert and active, improved appearance, no acute distress. Age appropriate. Cardiac: RRR, faint heart sounds, no murmurs Respiratory: crackles at lung bases bilaterally, normal effort, prolonged expiratory wheezing Abdomen: soft, nontender, markedly distended, +bowel sounds Extremities: 2+ LE edema   Skin: Warm and dry, LE venous stasis skin changes Neuro: alert and oriented x4  Laboratory: Recent Labs  Lab 08/15/19 0944 08/16/19 0633 08/18/19 0526  WBC 10.2 8.2 4.3  HGB 10.5* 9.0* 8.1*  HCT 33.7* 28.8* 25.9*  PLT 380 342 288   Recent Labs  Lab 08/15/19 0944 08/15/19 0944 08/16/19 0633 08/17/19 0608 08/18/19 0526  NA 140   < > 140 138 137  K 4.3   < > 4.2 4.6 4.2  CL 110   < > 110 107 103  CO2 16*   < > 21* 20* 23  BUN 37*   < > 41* 41* 42*  CREATININE 4.09*   < > 4.06* 3.93* 4.11*  CALCIUM 8.2*   < > 8.0* 7.9* 8.0*  PROT 7.5  --  6.4*  --   --   BILITOT 1.2  --  0.8  --   --   ALKPHOS 106  --  96  --   --   ALT 14  --  13  --   --   AST 18  --  15  --   --   GLUCOSE 78   < > 110* 152* 226*   < > = values in this interval not displayed.    Imaging/Diagnostic Tests:  CHEST - 2 VIEW COMPARISON:  December 14, 2018 IMPRESSION: Bilateral pulmonary opacities could represent pulmonary edema given history. However, an infectious process, including atypical infections, is not excluded on this study. There is no cardiomegaly.  PORTABLE CHEST 1 VIEW COMPARISON:  Portable exam 1123 hours compared to 05/15/2019 IMPRESSION: Probable pulmonary edema/CHF as above.  ABDOMEN ULTRASOUND COMPLETE COMPARISON:  None. IMPRESSION: 1. Very limited exam. No definite acute abnormality identified sonographically. 2. The proximal abdominal aorta measures 2.9 cm in diameter. Ectatic abdominal aorta at risk for aneurysm development. Recommend followup by ultrasound in 5 years. This recommendation follows ACR consensus guidelines: White Paper of the ACR Incidental Findings Committee II on Vascular Findings. J Am Coll Radiol 2013; 10:789-794.  No results found. Gladys Damme, MD 08/19/2019, 6:29 AM PGY-1, [place] Intern pager: [phone], text pages welcome

## 2019-08-19 NOTE — Progress Notes (Signed)
SATURATION QUALIFICATIONS: (This note is used to comply with regulatory documentation for home oxygen)  Patient Saturations on Room Air at Rest =96%  Patient Saturations on Room Air while Ambulating =94%  Patient Saturations on 2 Liters of oxygen while Ambulating 98%  Please briefly explain why patient needs home oxygen:

## 2019-08-19 NOTE — Progress Notes (Signed)
Pt adamantly refuses to use the cpap. He said it smothers him and he does not want to try a lower setting.

## 2019-08-19 NOTE — Progress Notes (Signed)
Inpatient Diabetes Program Recommendations  AACE/ADA: New Consensus Statement on Inpatient Glycemic Control (2015)  Target Ranges:  Prepandial:   less than 140 mg/dL      Peak postprandial:   less than 180 mg/dL (1-2 hours)      Critically ill patients:  140 - 180 mg/dL   Lab Results  Component Value Date   GLUCAP 199 (H) 08/19/2019   HGBA1C 8.5 (H) 08/16/2019    Review of Glycemic Control Results for Zachary Young, Zachary Young (MRN [id number]) as of 08/19/2019 10:25  Ref. Range 08/18/2019 06:17 08/18/2019 10:56 08/18/2019 16:11 08/18/2019 21:09 08/19/2019 06:11  Glucose-Capillary Latest Ref Range: 70 - 99 mg/dL 231 (H) 209 (H) 220 (H) 237 (H) 199 (H)   Diabetes history: DM 2 Outpatient Diabetes medications: Lantus 10 units, Humalog 4 units tid meal coverage Current orders for Inpatient glycemic control:  Lantus 10 units Novolog 0-9 units tid   Inpatient Diabetes Program Recommendations:    Glucose trends elevated in the 200 range.   - Consider increasing Lantus to 12 units  - Add Novolog 2 units tid meal coverage if eating >50% of meals.  Will continue to follow for insulin titration. Inpatient goal 140-180.  Thanks,  Tama Headings RN, MSN, BC-ADM Inpatient Diabetes Coordinator Team Pager [phone] (8a-5p)

## 2019-08-19 NOTE — TOC Initial Note (Addendum)
Transition of Care [facility]) - Initial/Assessment Note    Patient Details  Name: Zachary Young MRN: [id number] Date of Birth: 20-Mar-1961  [facility] CM/SW Contact:    Alberteen Sam, LCSW Phone Number: 08/19/2019, 10:20 AM  Clinical Narrative:                  CSW met with patient at bedside to determine housing needs for pending discharge. Patient reports he was living at his brother Trilby Drummer (they call him Butch) house, however he was not willing to pay some out of his check for rent and states he was staying at a hotel for one night before being admitted. Patient states his brother will be kicking him out and he needs a place to go. Other barriers to patient returning to his brothers in addition to refusal to pay rent with his monthly $500 check includes patient report he "likes to party".   Patient informed CSW he was unable to go to a shelter as he is currently on parole. His parole officer if officer Cox at [phone].   CSW spoke with officer Cox who states patient is on parole for kidnapping, sexual assault, rape, attempt to kill and is a classified sex offender.   CSW is to update officer Cox as to discharge plan as soon as it is secured, he requests to update him on his personal phone after hours at [phone].   CSW to follow up with patient's family members on identifying potential place to stay.   Trilby Drummer [name]) patient's brother he's currently living with - [phone]  Ervin Knack - [phone]  Niece Janace Hoard - [phone]  [name] - [phone]  Patient attempted to call all the above while CSW in room, however unable to reach any family at this time.    Expected Discharge Plan: (TBD) Barriers to Discharge: Homeless with medical needs   Patient Goals and CMS Choice   CMS Medicare.gov Compare Post Acute Care list provided to:: Patient Choice offered to / list presented to : Patient  Expected Discharge Plan and Services Expected Discharge  Plan: (TBD)       Living arrangements for the past 2 months: Single Family Home                                      Prior Living Arrangements/Services Living arrangements for the past 2 months: Single Family Home Lives with:: Roommate, Other (Comment)(brother) Patient language and need for interpreter reviewed:: Yes Do you feel safe going back to the place where you live?: Yes      Need for Family Participation in Patient Care: Yes (Comment) Care giver support system in place?: Yes (comment)   Criminal Activity/Legal Involvement Pertinent to Current Situation/Hospitalization: No - Comment as needed  Activities of Daily Living      Permission Sought/Granted Permission sought to share information with : Case Manager, Customer service manager, Family Supports Permission granted to share information with : Yes, Verbal Permission Granted  Share Information with NAME: [name]     Permission granted to share info w Relationship: sister  Permission granted to share info w Contact Information: [phone]  Emotional Assessment Appearance:: Appears stated age Attitude/Demeanor/Rapport: Complaining, Inconsistent, Irrational, Reactive Affect (typically observed): Agitated, Apprehensive, Defensive Orientation: : Oriented to Self, Oriented to Place, Oriented to  Time, Oriented to Situation Alcohol / Substance Use: Not Applicable Psych Involvement: No (comment)  Admission diagnosis:  CHF (congestive heart failure) (HCC) [I50.9] Abdominal pain [R10.9] Acute hypoxemic respiratory failure (HCC) [J96.01] Chronic kidney disease, unspecified CKD stage [N18.9] Acute on chronic congestive heart failure, unspecified heart failure type [facility]) [I50.9] Patient Active Problem List   Diagnosis Date Noted  . Dyspnea   . Abdominal pain   . CHF (congestive heart failure) ([place]) 08/15/2019  . Acute hypoxemic respiratory failure ([place])   . Anasarca 06/17/2019  . Gastritis 05/26/2019  . GI  bleed 12/14/2018  . Chronic kidney disease, stage 4 (severe) ([place]) 06/26/2018  . Acute on chronic combined systolic and diastolic heart failure ([place]) 06/26/2018  . Moderate nonproliferative retinopathy due to secondary diabetes ([place]) 04/30/2018  . HLD (hyperlipidemia) 02/28/2018  . History of MI (myocardial infarction) 05/22/2016  . History of osteomyelitis 05/22/2016  . Tobacco use disorder 03/27/2016  . HTN (hypertension) 03/26/2016  . Type 2 diabetes mellitus with circulatory disorder, with long-term current use of insulin ([place]) 03/26/2016  . Toe amputation status 03/17/2014   PCP:  Patient, No Pcp Per Pharmacy:   [id number] [address], [place] [address] Phone: [phone] Fax: [phone]     Social Determinants of Health (SDOH) Interventions    Readmission Risk Interventions No flowsheet data found.

## 2019-08-19 NOTE — Progress Notes (Signed)
Turned O2 off at request of patient. Patient satting  from 91-95% while moving around bed and having conversation. Will continue to monitor.  Ordered K pad for back pain.

## 2019-08-19 NOTE — Evaluation (Signed)
Occupational Therapy Evaluation Patient Details Name: Zachary Young MRN: [id number] DOB: 01-11-1961 Today's Date: 08/19/2019    History of Present Illness 59 y.o. male presenting with CKD. PMH is significant for diabetes, CKD, obesity, CHF, hypertension, hyperlipidemia, coronary artery disease, MI, tobacco misuse. COVID 02 Feb 2019, GI bleed, osteomyelitis post amputation of left & right toes. Came to ED with acute worsening DoE and LE edema. Admitted 08/15/19 for treatment of Acute on chronic combined systolic and diastolic heart failure.    Clinical Impression   PTA pt living with brother, independent for BADL (majority of the time, as needed assist for LB) and needing assist for IADLs. At time of eval, pt is completing bed mobility at mod I and sit <> stands at min guard level with RW. Pt is resistant to therapist suggestions. Reviewed energy conservation strategies, home safety, and fall prevention with patient. Pt is completing BADL at min guard level this admission. He is most concerned for finding housing due to his criminal charges. He denies any further OT services. OT will sign off, pt is at baseline.    Follow Up Recommendations  No OT follow up    Equipment Recommendations  3 in 1 bedside commode    Recommendations for Other Services       Precautions / Restrictions Precautions Precautions: Fall Restrictions Weight Bearing Restrictions: No      Mobility Bed Mobility Overal bed mobility: Modified Independent                Transfers Overall transfer level: Needs assistance Equipment used: Rolling walker (2 wheeled) Transfers: Sit to/from Stand Sit to Stand: Min guard              Balance Overall balance assessment: Mild deficits observed, not formally tested                                         ADL either performed or assessed with clinical judgement   ADL Overall ADL's : At baseline                                        General ADL Comments: Pt demosntrates ability to complete BADL at baseline level. He requires intermittent assist for LB management but was able to perform this without assist on eval this date. Pt completing mobility at min guard level with DME. REsistant to therapy suggestions. Reviewed ECS and home safety     Vision Patient Visual Report: No change from baseline       Perception     Praxis      Pertinent Vitals/Pain Pain Assessment: Faces Faces Pain Scale: Hurts a little bit Pain Location: generalized with movement  Pain Descriptors / Indicators: Grimacing;Guarding Pain Intervention(s): Limited activity within patient's tolerance;Monitored during session;Repositioned     Hand Dominance     Extremity/Trunk Assessment Upper Extremity Assessment Upper Extremity Assessment: Overall WFL for tasks assessed   Lower Extremity Assessment Lower Extremity Assessment: Defer to PT evaluation       Communication Communication Communication: No difficulties   Cognition Arousal/Alertness: Awake/alert Behavior During Therapy: WFL for tasks assessed/performed;Impulsive Overall Cognitive Status: No family/caregiver present to determine baseline cognitive functioning Area of Impairment: Following commands;Problem solving;Safety/judgement  Following Commands: Follows multi-step commands inconsistently;Follows one step commands inconsistently Safety/Judgement: Decreased awareness of safety   Problem Solving: Requires verbal cues;Requires tactile cues General Comments: pt rude and demanding of OT at times, not following suggestions   General Comments       Exercises     Shoulder Instructions      Home Living Family/patient expects to be discharged to:: Unsure                                 Additional Comments: pt was living with his brother however pt reports his brother is "putting me out." Pt would like assistance from SW  in finding housing. He does have a hx of criminal charges      Prior Functioning/Environment Level of Independence: Needs assistance  Gait / Transfers Assistance Needed: walks household distances with cane ADL's / Homemaking Assistance Needed: requires assist for bathing and dressing, family was providing assist with iADLs            OT Problem List: Decreased knowledge of use of DME or AE;Decreased knowledge of precautions;Cardiopulmonary status limiting activity      OT Treatment/Interventions:      OT Goals(Current goals can be found in the care plan section) Acute Rehab OT Goals Patient Stated Goal: find some place to live OT Goal Formulation: All assessment and education complete, DC therapy  OT Frequency:     Barriers to D/C:            Co-evaluation              AM-PAC OT "6 Clicks" Daily Activity     Outcome Measure Help from another person eating meals?: None Help from another person taking care of personal grooming?: None Help from another person toileting, which includes using toliet, bedpan, or urinal?: None Help from another person bathing (including washing, rinsing, drying)?: None Help from another person to put on and taking off regular upper body clothing?: None Help from another person to put on and taking off regular lower body clothing?: None 6 Click Score: 24   End of Session Equipment Utilized During Treatment: Gait belt;Rolling walker Nurse Communication: Mobility status  Activity Tolerance: Patient tolerated treatment well Patient left: in bed;with call bell/phone within reach  OT Visit Diagnosis: Other abnormalities of gait and mobility (R26.89)                Time: 1346-1405 OT Time Calculation (min): 19 min Charges:  OT General Charges $OT Visit: 1 Visit OT Evaluation $OT Eval Moderate Complexity: 1 Mod  Zenovia Jarred, MSOT, OTR/L [place] [facility] Office Number: [phone] Pager: [phone]  Zenovia Jarred 08/19/2019, 4:59 PM

## 2019-08-20 ENCOUNTER — Encounter (HOSPITAL_COMMUNITY): Payer: Self-pay | Admitting: Emergency Medicine

## 2019-08-20 ENCOUNTER — Emergency Department (HOSPITAL_COMMUNITY)
Admission: EM | Admit: 2019-08-20 | Discharge: 2019-08-21 | Disposition: A | Discharge status: Home / Self Care | Payer: Medicaid Other | Attending: Emergency Medicine | Admitting: Emergency Medicine

## 2019-08-20 ENCOUNTER — Emergency Department (HOSPITAL_COMMUNITY): Payer: Medicaid Other

## 2019-08-20 ENCOUNTER — Other Ambulatory Visit: Payer: Self-pay

## 2019-08-20 DIAGNOSIS — I110 Hypertensive heart disease with heart failure: Secondary | ICD-10-CM | POA: Insufficient documentation

## 2019-08-20 DIAGNOSIS — E119 Type 2 diabetes mellitus without complications: Secondary | ICD-10-CM | POA: Insufficient documentation

## 2019-08-20 DIAGNOSIS — I5042 Chronic combined systolic (congestive) and diastolic (congestive) heart failure: Secondary | ICD-10-CM | POA: Insufficient documentation

## 2019-08-20 DIAGNOSIS — Z794 Long term (current) use of insulin: Secondary | ICD-10-CM | POA: Insufficient documentation

## 2019-08-20 DIAGNOSIS — Z79899 Other long term (current) drug therapy: Secondary | ICD-10-CM | POA: Insufficient documentation

## 2019-08-20 DIAGNOSIS — R0602 Shortness of breath: Secondary | ICD-10-CM | POA: Insufficient documentation

## 2019-08-20 DIAGNOSIS — R0789 Other chest pain: Secondary | ICD-10-CM | POA: Insufficient documentation

## 2019-08-20 LAB — BASIC METABOLIC PANEL
Anion gap: 9 (ref 5–15)
BUN: 43 mg/dL — ABNORMAL HIGH (ref 6–20)
CO2: 27 mmol/L (ref 22–32)
Calcium: 8.7 mg/dL — ABNORMAL LOW (ref 8.9–10.3)
Chloride: 106 mmol/L (ref 98–111)
Creatinine, Ser: 3.86 mg/dL — ABNORMAL HIGH (ref 0.61–1.24)
GFR calc Af Amer: 19 mL/min — ABNORMAL LOW (ref >60)
GFR calc non Af Amer: 16 mL/min — ABNORMAL LOW (ref >60)
Glucose, Bld: 104 mg/dL — ABNORMAL HIGH (ref 70–99)
Potassium: 4 mmol/L (ref 3.5–5.1)
Sodium: 142 mmol/L (ref 135–145)

## 2019-08-20 LAB — CBC
HCT: 28.2 % — ABNORMAL LOW (ref 39.0–52.0)
Hemoglobin: 8.7 g/dL — ABNORMAL LOW (ref 13.0–17.0)
MCH: 28 pg (ref 26.0–34.0)
MCHC: 30.9 g/dL (ref 30.0–36.0)
MCV: 90.7 fL (ref 80.0–100.0)
Platelets: 335 10*3/uL (ref 150–400)
RBC: 3.11 MIL/uL — ABNORMAL LOW (ref 4.22–5.81)
RDW: 16 % — ABNORMAL HIGH (ref 11.5–15.5)
WBC: 6.1 10*3/uL (ref 4.0–10.5)
nRBC: 0 % (ref 0.0–0.2)

## 2019-08-20 LAB — GLUCOSE, CAPILLARY
Glucose-Capillary: 98 mg/dL (ref 70–99)
Glucose-Capillary: 185 mg/dL — ABNORMAL HIGH (ref 70–99)

## 2019-08-20 MED ORDER — ASPIRIN EC 81 MG PO TBEC
81.0000 mg | DELAYED_RELEASE_TABLET | Freq: Every day | ORAL | Status: DC
  Administered 2019-08-20: 81 mg via ORAL
  Filled 2019-08-20: qty 1

## 2019-08-20 MED ORDER — ASPIRIN 81 MG PO TBEC: 81.0000 mg | DELAYED_RELEASE_TABLET | Freq: Every day | ORAL | 0 refills | Status: DC

## 2019-08-20 MED ORDER — AEROCHAMBER PLUS FLO-VU LARGE MISC: 1.0000 | Freq: Once | 0 refills | Status: AC

## 2019-08-20 MED ORDER — POTASSIUM CHLORIDE ER 20 MEQ PO TBCR: 40.0000 meq | EXTENDED_RELEASE_TABLET | Freq: Every day | ORAL | 0 refills | Status: DC

## 2019-08-20 MED ORDER — ALBUTEROL SULFATE HFA 108 (90 BASE) MCG/ACT IN AERS: 1.0000 | INHALATION_SPRAY | RESPIRATORY_TRACT | 0 refills | Status: DC | PRN

## 2019-08-20 MED ORDER — AMLODIPINE BESYLATE 10 MG PO TABS
10.0000 mg | ORAL_TABLET | Freq: Every day | ORAL | Status: DC
  Administered 2019-08-20: 10 mg via ORAL
  Filled 2019-08-20: qty 1

## 2019-08-20 MED ORDER — TORSEMIDE 20 MG PO TABS: 80.0000 mg | ORAL_TABLET | Freq: Two times a day (BID) | ORAL | 0 refills | Status: DC

## 2019-08-20 MED ORDER — AMLODIPINE BESYLATE 10 MG PO TABS: 10.0000 mg | ORAL_TABLET | Freq: Every day | ORAL | 0 refills | Status: DC

## 2019-08-20 MED ORDER — ALBUTEROL SULFATE HFA 108 (90 BASE) MCG/ACT IN AERS
1.0000 | INHALATION_SPRAY | RESPIRATORY_TRACT | Status: DC | PRN
  Filled 2019-08-20: qty 6.7

## 2019-08-20 MED ORDER — SODIUM CHLORIDE 0.9% FLUSH: 3.0000 mL | Freq: Once | INTRAVENOUS | Status: DC

## 2019-08-20 MED FILL — POTASSIUM CHLORIDE 20meqER: 20 | 30 days supply | Qty: 60 | Fill #0

## 2019-08-20 MED FILL — TORSEMIDE 20 MG TABLET: 20 | 30 days supply | Qty: 240 | Fill #0

## 2019-08-20 MED FILL — AMLODIPINE BESYLATE 10 MG T: 10 | 30 days supply | Qty: 30 | Fill #0

## 2019-08-20 MED FILL — ASPIRIN LOW DOSE 81 MG TBEC: 81 | 30 days supply | Qty: 30 | Fill #0

## 2019-08-20 MED FILL — ALBUTEROL SULFATE HFA 108 (: 108 (90 BAS | 30 days supply | Qty: 18 | Fill #0

## 2019-08-20 NOTE — TOC Transition Note (Signed)
Transition of Care [facility]) - CM/SW Discharge Note   Patient Details  Name: Zachary Young MRN: [id number] Date of Birth: [birth year]  Transition of Care [facility]) CM/SW Contact:  Alberteen Sam, LCSW Phone Number: 08/20/2019, 4:44 PM   Clinical Narrative:     CSW informed patient was in a rush to leave and did not want to wait on any DME and declined home health services, patient was discharged back to his hotel he was staying art PTA as CSW was unable to identify any family to accept patient in their home. CSW has updated patient's parole officer, Officer Cox, on patient's discharge today.  Final next level of care: Home/Self Care Barriers to Discharge: No Barriers Identified   Patient Goals and CMS Choice   CMS Medicare.gov Compare Post Acute Care list provided to:: Patient Choice offered to / list presented to : Patient  Discharge Placement                       Discharge Plan and Services                                     Social Determinants of Health (SDOH) Interventions     Readmission Risk Interventions No flowsheet data found.

## 2019-08-20 NOTE — TOC Progression Note (Signed)
Transition of Care [facility]) - Progression Note    Patient Details  Name: Zachary Young MRN: [id number] Date of Birth: 1960/11/11  Transition of Care [place]) CM/SW [place], [place] Phone Number: 08/20/2019, 12:45 PM  Clinical Narrative:     CSW called all of patient's family members listed in previous notes, none able to accept patient back due to his past behaviors, current felony and sex offender charges, and his inability to comply with boundaries family members have attempted to set with him.   Patient unable to go to shelter also due to charges, patient's Parole officer reports no known residences that accept both felon and sex offenders.   Plan for patient at dc would be for him to return to his hotel at this time, utilizing his monthly check of $500 and his $250 food stamps to do so.    Expected Discharge Plan: (TBD) Barriers to Discharge: Homeless with medical needs  Expected Discharge Plan and Services Expected Discharge Plan: (TBD)       Living arrangements for the past 2 months: Single Family Home                                       Social Determinants of Health (SDOH) Interventions    Readmission Risk Interventions No flowsheet data found.

## 2019-08-20 NOTE — Progress Notes (Signed)
Physical Therapy Treatment Patient Details Name: Zachary Young MRN: [id number] DOB: 1960/07/29 Today's Date: 08/20/2019    History of Present Illness 59 y.o. male presenting with CKD. PMH is significant for diabetes, CKD, obesity, CHF, hypertension, hyperlipidemia, coronary artery disease, MI, tobacco misuse. COVID 02 Feb 2019, GI bleed, osteomyelitis post amputation of left & right toes. Came to ED with acute worsening DoE and LE edema. Admitted 08/15/19 for treatment of Acute on chronic combined systolic and diastolic heart failure.     PT Comments    Pt is progressing well towards goals. He did much better with cane vs RW. And considering pt's lifestyle, a cane may be more practical.  Will continue to follow acutely for mobility progression.   Follow Up Recommendations  Home health PT;Supervision for mobility/OOB     Equipment Recommendations  Rolling walker with 5" wheels    Recommendations for Other Services OT consult     Precautions / Restrictions Precautions Precautions: Fall Restrictions Weight Bearing Restrictions: No    Mobility  Bed Mobility Overal bed mobility: Modified Independent             General bed mobility comments: flattened bed with use of bed rails  Transfers Overall transfer level: Needs assistance Equipment used: Straight cane Transfers: Sit to/from Stand Sit to Stand: Supervision         General transfer comment: supervision for safety  Ambulation/Gait Ambulation/Gait assistance: Min guard Gait Distance (Feet): 300 Feet Assistive device: Straight cane Gait Pattern/deviations: Step-through pattern;Decreased step length - right;Decreased step length - left;Wide base of support     General Gait Details: min guard for safety with pt occasionally reaching for additional support from hand rails. Mildly unsteady but no overt LOB noted.    Stairs             Wheelchair Mobility    Modified Rankin (Stroke Patients Only)        Balance Overall balance assessment: Mild deficits observed, not formally tested Sitting-balance support: Feet supported;No upper extremity supported Sitting balance-Leahy Scale: Good     Standing balance support: Single extremity supported;During functional activity;No upper extremity supported;Bilateral upper extremity supported Standing balance-Leahy Scale: Poor                              Cognition Arousal/Alertness: Awake/alert Behavior During Therapy: WFL for tasks assessed/performed;Flat affect Overall Cognitive Status: Within Functional Limits for tasks assessed                                 General Comments: Seemed agreeable this session, a little flat, but not rude or impulsive today.       Exercises      General Comments        Pertinent Vitals/Pain Pain Assessment: No/denies pain    Home Living                      Prior Function            PT Goals (current goals can now be found in the care plan section) Acute Rehab PT Goals Patient Stated Goal: find some place to live PT Goal Formulation: With patient Time For Goal Achievement: 09/01/19 Potential to Achieve Goals: Fair Progress towards PT goals: Progressing toward goals    Frequency    Min 3X/week      PT Plan Current plan  remains appropriate    Co-evaluation              AM-PAC PT "6 Clicks" Mobility   Outcome Measure  Help needed turning from your back to your side while in a flat bed without using bedrails?: None Help needed moving from lying on your back to sitting on the side of a flat bed without using bedrails?: A Little Help needed moving to and from a bed to a chair (including a wheelchair)?: None Help needed standing up from a chair using your arms (e.g., wheelchair or bedside chair)?: None Help needed to walk in hospital room?: A Little Help needed climbing 3-5 steps with a railing? : A Little 6 Click Score: 21    End of Session  Equipment Utilized During Treatment: Gait belt Activity Tolerance: Patient tolerated treatment well Patient left: in chair;with call bell/phone within reach Nurse Communication: Mobility status PT Visit Diagnosis: Unsteadiness on feet (R26.81);Other abnormalities of gait and mobility (R26.89);Muscle weakness (generalized) (M62.81);Difficulty in walking, not elsewhere classified (R26.2)     Time: 6606-0045 PT Time Calculation (min) (ACUTE ONLY): 21 min  Charges:  $Gait Training: 8-22 mins                     Benjiman Core, [place] Pager [phone] [place] 08/20/2019, 1:29 PM

## 2019-08-20 NOTE — ED Triage Notes (Signed)
Pt c/o shortness of breath and 10/10 chest pain that started today. Pt d/c from hospital today for CHF exacerbation. C/o cough.

## 2019-08-20 NOTE — Progress Notes (Signed)
Patient alert and oriented , denies pain, patient voided in restroom multiple times today prior to d/c but did not save the output for measuring.  IV and tele removed, d/c instruction explain and given to the patient, all questions answer. Patient d/c per order

## 2019-08-20 NOTE — Discharge Instructions (Signed)
While in hospital you were treated for shortness of breath, due to heart failure and increased fluid.  We increased your diuretic and you were able to breathe better.  We recommend continuing to use albuterol inhaler as needed for wheezing, and taking diuretic (torsemide) twice a day.  Continue to weigh yourself daily and if you gain more than 5 pounds, you should call your primary doctor.  If you have difficulty breathing or chest pain, please seek immediate emergency medical care.   Heart Failure, Self Care Heart failure is a serious condition. This sheet explains things you need to do to take care of yourself at home. To help you stay as healthy as possible, you may be asked to change your diet, take certain medicines, and make other changes in your life. Your doctor may also give you more specific instructions. If you have problems or questions, call your doctor. What are the risks? Having heart failure makes it more likely for you to have some problems. These problems can get worse if you do not take good care of yourself. Problems may include:  Blood clotting problems. This may cause a stroke.  Damage to the kidneys, liver, or lungs.  Abnormal heart rhythms. Supplies needed:  Scale for weighing yourself.  Blood pressure monitor.  Notebook.  Medicines. How to care for yourself when you have heart failure Medicines Take over-the-counter and prescription medicines only as told by your doctor. Take your medicines every day.  Do not stop taking your medicine unless your doctor tells you to do so.  Do not skip any medicines.  Get your prescriptions refilled before you run out of medicine. This is important. Eating and drinking   Eat heart-healthy foods. Talk with a diet specialist (dietitian) to create an eating plan.  Choose foods that: ? Have no trans fat. ? Are low in saturated fat and cholesterol.  Choose healthy foods, such as: ? Fresh or frozen fruits and  vegetables. ? Fish. ? Low-fat (lean) meats. ? Legumes, such as beans, peas, and lentils. ? Fat-free or low-fat dairy products. ? Whole-grain foods. ? High-fiber foods.  Limit salt (sodium) if told by your doctor. Ask your diet specialist to tell you which seasonings are healthy for your heart.  Cook in healthy ways instead of frying. Healthy ways of cooking include roasting, grilling, broiling, baking, poaching, steaming, and stir-frying.  Limit how much fluid you drink, if told by your doctor. Alcohol use  Do not drink alcohol if: ? Your doctor tells you not to drink. ? Your heart was damaged by alcohol, or you have very bad heart failure. ? You are pregnant, may be pregnant, or are planning to become pregnant.  If you drink alcohol: ? Limit how much you use to:  0-1 drink a day for women.  0-2 drinks a day for men. ? Be aware of how much alcohol is in your drink. In the [place] one drink equals one 12 oz bottle of beer (355 mL), one 5 oz glass of wine (148 mL), or one 1 oz glass of hard liquor (44 mL). Lifestyle   Do not use any products that contain nicotine or tobacco, such as cigarettes, e-cigarettes, and chewing tobacco. If you need help quitting, ask your doctor. ? Do not use nicotine gum or patches before talking to your doctor.  Do not use illegal drugs.  Lose weight if told by your doctor.  Do physical activity if told by your doctor. Talk to your doctor before you  begin an exercise if: ? You are an older adult. ? You have very bad heart failure.  Learn to manage stress. If you need help, ask your doctor.  Get rehab (rehabilitation) to help you stay independent and to help with your quality of life.  Plan time to rest when you get tired. Check weight and blood pressure   Weigh yourself every day. This will help you to know if fluid is building up in your body. ? Weigh yourself every morning after you pee (urinate) and before you eat breakfast. ? Wear the  same amount of clothing each time. ? Write down your daily weight. Give your record to your doctor.  Check and write down your blood pressure as told by your doctor.  Check your pulse as told by your doctor. Dealing with very hot and very cold weather  If it is very hot: ? Avoid activities that take a lot of energy. ? Use air conditioning or fans, or find a cooler place. ? Avoid caffeine and alcohol. ? Wear clothing that is loose-fitting, lightweight, and light-colored.  If it is very cold: ? Avoid activities that take a lot of energy. ? Layer your clothes. ? Wear mittens or gloves, a hat, and a scarf when you go outside. ? Avoid alcohol. Follow these instructions at home:  Stay up to date with shots (vaccines). Get pneumococcal and flu (influenza) shots.  Keep all follow-up visits as told by your doctor. This is important. Contact a doctor if:  You gain weight quickly.  You have increasing shortness of breath.  You cannot do your normal activities.  You get tired easily.  You cough a lot.  You don't feel like eating or feel like you may vomit (nauseous).  You become puffy (swell) in your hands, feet, ankles, or belly (abdomen).  You cannot sleep well because it is hard to breathe.  You feel like your heart is beating fast (palpitations).  You get dizzy when you stand up. Get help right away if:  You have trouble breathing.  You or someone else notices a change in your behavior, such as having trouble staying awake.  You have chest pain or discomfort.  You pass out (faint). These symptoms may be an emergency. Do not wait to see if the symptoms will go away. Get medical help right away. Call your local emergency services (911 in the [place] Do not drive yourself to the hospital. Summary  Heart failure is a serious condition. To care for yourself, you may have to change your diet, take medicines, and make other lifestyle changes.  Take your medicines every day.  Do not stop taking them unless your doctor tells you to do so.  Eat heart-healthy foods, such as fresh or frozen fruits and vegetables, fish, lean meats, legumes, fat-free or low-fat dairy products, and whole-grain or high-fiber foods.  Ask your doctor if you can drink alcohol. You may have to stop alcohol use if you have very bad heart failure.  Contact your doctor if you gain weight quickly or feel that your heart is beating too fast. Get help right away if you pass out, or have chest pain or trouble breathing. This information is not intended to replace advice given to you by your health care provider. Make sure you discuss any questions you have with your health care provider. Document Revised: 07/15/2018 Document Reviewed: 07/16/2018 Elsevier Patient Education  Zachary Young.

## 2019-08-20 NOTE — Progress Notes (Signed)
Family Medicine Teaching Service Daily Progress Note Intern Pager: [phone]  Patient name: Zachary Young Medical record number: [id number] Date of birth: 05/06/1960 Age: 59 y.o. Gender: male  Primary Care Provider: Patient, No Pcp Per Consultants: N/A Code Status: FULL  Pt Overview and Major Events to Date:  5/1 Admitted  Assessment and Plan: Lennell Shanks is a 59 y.o. male presenting with CKD. PMH is significant for diabetes, CKD, obesity, CHF, hypertension, hyperlipidemia, coronary artery disease, MI, tobacco misuse. COVID 02 Feb 2019, GI bleed, osteomyelitis post amputation of left & right toes.    Hypoxic respiratory failure likely 2/2 HFrEF vs COPD exacerbation No acute events overnight, VSS, afebrile. Lung auscultation clear, stable on RA. Ambulated yesterday with pulse ox, and SpO2 remained above 88%, no need for home O2. Patient is medically stable for discharge. SW assisting with housing as patient has complex social situation and currently homeless. Patient has improved with diuresis and albuterol, which increases likelihood that this is a mixed picture of CHF with likely some pulmonary obstructive disease as patient has a long smoking history. UOP on torsemide 80 mg PO only 614mL yesterday, suspect approaching dry weight as patient is much more comfortable, able to lie flat, bend over, does not require O2. Plan to assess fluid output on torsemide this morning and increase dosing to BID. Weight 145.6kg today, down from 159.6kg at admission and 149.9kg yesterday. -Switch to torsemide 80 mg daily PO, reassess this afternoon for second dose -Vitals per floor routine, continuous pulse ox  - O2 PRN sats sustained at 90% -Strict I's and O's -Sodium restriction -Albuterol MDI q4h -2 L fluid restriction  -Daily weights -Am CBC and CMP -Mucinex + Delsym   -Consult TOC for housing instability  HFmrEF Last echo 06/22/19 at [facility] during CHF admission showed 45-50% LVEF, though poor  visualization of structures. Home medications include lasix 80 mg BID, bumex 1mg  BID, bidil 20-37.5, amlodipine, metoprolol.  - fluid restriction < 2L  - continue bidil (hold home dose lasix & bumex).  -Continue torsemide as above   Anemia Hgb stable at 8.7 from 8.5 yesterday. Baseline hgb around 10-11. 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 Will continue to monitor and if hgb drops below 8, will transfuse as patient has history of CAD. -Daily CBC -Transfuse if hgb <8  CKD IV Cr improved overall: baseline is 3-4, cr today 3.8. Follows with Dr. Radene Knee, last visit 07/20/19. Nephrology did not feel he needed dialysis at the time. Does not appear uremic. BUN chronically in the 30's. UPC on 08/10/19 9.364.  Renal US 06/21/19 with normal R kidney, limited view L kidney, and PVR 315. Mediocre UOP with one dose of torsemide yesterday, but patient still improved and likely approaching dry weight, will continue to monitor.  -Monitor creatinine - continue home phoslo 1334 TID   Anasarca, testicular swelling Both are most likely due to volume overload in the setting of CHF and intake of excess sodium per patient. Albumin 2.4. Appropriate response to lasix as above, continued decrease in swelling today. Vascular US with enlarged groin lymph nodes. -Strict I's and O's -Daily weights -<2g Sodium restriction -2 L fluid restriction -Daily BMP with diuresis   Left Lateral Abdominal pain- resolved Abdominal U/S:Very limited exam. No definite acute abnormality identified sonographically.The proximal abdominal aorta measures 2.9 cm in diameter. Ectatic abdominal aorta at risk for aneurysm development. Cause of abdominal pain unclear, erythema resolved. -Tylenol 650mg  Q6H   Chest pain. Resolved. -Monitor chest pain -Low threshold to repeat  troponins given history of MI  Coronary artery disease, MI  Per [facility] notes patient sees cardiologist in [place]. - continue home ASA 81  -Monitor for chest pain    Diabetes CBG on admission 397, overnight 239 down to 104. last A1c 8.5. Home medication is lantus 10U, continued inpatient. Home meds: Lantus 10 units once daily, Humalog 3 times daily with meals, victoza 1.2 mg daily  -Sensitive sliding scale -Monitor CBGs 3 times daily and bedtime -Lantus 10U  Hypertension SBP today 139-154, DBP 75-83, most recently 162/82. Restarting amlodipine. Can start metoprolol home med on discharge Home meds: Amlodipine 10 mg once daily, metoprolol 100 mg daily -Start amlodipine 10 mg  -hold metoprolol  Hyperlipidemia Tot chol 115, HDL 41, LDL 50. ASCVD risk 25%. -Atorvastatin 80 mg once daily  Tobacco misuse disorder Smokes 1 pack every 2 days, chronic smoker  -Nicotine patch 12mg   -Encourage smoking cessation   Penile Lesion  Small irregular lesion on dorsal aspect of body of penis. Likely self inflicted given personal account of pruritis.  -Monitor area -hydrocortisone cream for pruritis.   FEN/GI: Heart healthy diet, sodium restriction, fluid restriction 2028ml  Prophylaxis: Lovenox 30mg    Disposition: Awaiting SW recommendations for safe disposition, medically ready  Subjective:   Objective: Temp:  [98 F (36.7 C)-98.8 F (37.1 C)] 98.3 F (36.8 C) (05/06 0510) Pulse Rate:  [86-87] 86 (05/06 0510) Resp:  [16-18] 18 (05/06 0510) BP: (139-154)/(75-83) 154/83 (05/06 0510) SpO2:  [94 %-97 %] 95 % (05/06 0510) Weight:  [145.6 kg] 145.6 kg (05/06 0510)   Physical Exam:  General: Alert and active, improved appearance, no acute distress. Age appropriate. Cardiac: RRR, faint heart sounds, no murmurs Respiratory: CTAB, no increased WOB, no wheezes/rales/rhonchi Abdomen: soft, nontender, markedly distended, +bowel sounds Extremities: 2+ LE edema  Skin: Warm and dry, LE venous stasis skin changes Neuro: alert and oriented x4  Laboratory: Recent Labs  Lab 08/18/19 0526 08/19/19 0548 08/20/19 0449  WBC 4.3 5.1 6.1  HGB 8.1* 8.5*  8.7*  HCT 25.9* 27.5* 28.2*  PLT 288 308 335   Recent Labs  Lab 08/15/19 0944 08/15/19 0944 08/16/19 0633 08/17/19 0608 08/18/19 0526 08/19/19 0548 08/20/19 0449  NA 140   < > 140   < > 137 139 142  K 4.3   < > 4.2   < > 4.2 4.4 4.0  CL 110   < > 110   < > 103 106 106  CO2 16*   < > 21*   < > 23 23 27   BUN 37*   < > 41*   < > 42* 41* 43*  CREATININE 4.09*   < > 4.06*   < > 4.11* 3.83* 3.86*  CALCIUM 8.2*   < > 8.0*   < > 8.0* 8.5* 8.7*  PROT 7.5  --  6.4*  --   --   --   --   BILITOT 1.2  --  0.8  --   --   --   --   ALKPHOS 106  --  96  --   --   --   --   ALT 14  --  13  --   --   --   --   AST 18  --  15  --   --   --   --   GLUCOSE 78   < > 110*   < > 226* 196* 104*   < > = values in this interval  not displayed.    Imaging/Diagnostic Tests:  CHEST - 2 VIEW COMPARISON:  December 14, 2018 IMPRESSION: Bilateral pulmonary opacities could represent pulmonary edema given history. However, an infectious process, including atypical infections, is not excluded on this study. There is no cardiomegaly.  PORTABLE CHEST 1 VIEW COMPARISON:  Portable exam 1123 hours compared to 05/15/2019 IMPRESSION: Probable pulmonary edema/CHF as above.  ABDOMEN ULTRASOUND COMPLETE COMPARISON:  None. IMPRESSION: 1. Very limited exam. No definite acute abnormality identified sonographically. 2. The proximal abdominal aorta measures 2.9 cm in diameter. Ectatic abdominal aorta at risk for aneurysm development. Recommend followup by ultrasound in 5 years. This recommendation follows ACR consensus guidelines: White Paper of the ACR Incidental Findings Committee II on Vascular Findings. J Am Coll Radiol 2013; 10:789-794.  No results found. Gladys Damme, MD 08/20/2019, 6:32 AM PGY-1, [place] Intern pager: [phone], text pages welcome

## 2019-08-20 NOTE — Progress Notes (Signed)
FPTS Attending Brief Note  Patient seen and examined. Doing well, breathing comfortably. Agreeable with going home today if a suitable discharge location is identified. Stable for discharge from a medical standpoint.  Will sign resident note when it is available.  Leeanne Rio, MD

## 2019-08-21 LAB — BASIC METABOLIC PANEL
Anion gap: 13 (ref 5–15)
BUN: 43 mg/dL — ABNORMAL HIGH (ref 6–20)
CO2: 27 mmol/L (ref 22–32)
Calcium: 9.2 mg/dL (ref 8.9–10.3)
Chloride: 97 mmol/L — ABNORMAL LOW (ref 98–111)
Creatinine, Ser: 3.78 mg/dL — ABNORMAL HIGH (ref 0.61–1.24)
GFR calc Af Amer: 19 mL/min — ABNORMAL LOW (ref >60)
GFR calc non Af Amer: 17 mL/min — ABNORMAL LOW (ref >60)
Glucose, Bld: 306 mg/dL — ABNORMAL HIGH (ref 70–99)
Potassium: 4.2 mmol/L (ref 3.5–5.1)
Sodium: 137 mmol/L (ref 135–145)

## 2019-08-21 LAB — TROPONIN I (HIGH SENSITIVITY)
Troponin I (High Sensitivity): 21 ng/L — ABNORMAL HIGH (ref <18)
Troponin I (High Sensitivity): 23 ng/L — ABNORMAL HIGH (ref <18)

## 2019-08-21 LAB — CBC
HCT: 32.9 % — ABNORMAL LOW (ref 39.0–52.0)
Hemoglobin: 10.3 g/dL — ABNORMAL LOW (ref 13.0–17.0)
MCH: 28.1 pg (ref 26.0–34.0)
MCHC: 31.3 g/dL (ref 30.0–36.0)
MCV: 89.6 fL (ref 80.0–100.0)
Platelets: 410 10*3/uL — ABNORMAL HIGH (ref 150–400)
RBC: 3.67 MIL/uL — ABNORMAL LOW (ref 4.22–5.81)
RDW: 15.7 % — ABNORMAL HIGH (ref 11.5–15.5)
WBC: 7 10*3/uL (ref 4.0–10.5)
nRBC: 0 % (ref 0.0–0.2)

## 2019-08-21 NOTE — Discharge Instructions (Signed)
Continue all of your home medications. Follow-up with your primary care doctor. Return to the ED for new or worsening symptoms.
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
## 2019-08-23 NOTE — Discharge Summary (Addendum)
[facility] Discharge Summary  Patient name: Zachary Young Medical record number: [id number] Date of birth: [birth year] Age: 59 y.o. Gender: male Date of Admission: 08/20/2019  Date of Discharge: 08/20/2019 Admitting Physician: No admitting provider for patient encounter.  Primary Care Provider: System, Pcp Not In Consultants: None  Indication for Hospitalization: acute hypoxemic respiratory failure  Discharge Diagnoses/Problem List:  Acute on chronic combined systolic and diastolic heart failure Acute hypoxemic respiratory failure from cardiogenic pulmonary edema Anasarca T2DM, uncontrolled with end organ damage CKD, stage IV (severe) Obesity HTN HLD CAD H/o MI Tobacco abuse  Disposition: to friend's house  Discharge Condition: stable and improved  Discharge Exam:  Temp:  [98 F (36.7 C)-98.8 F (37.1 C)] 98.3 F (36.8 C) (05/06 0510) Pulse Rate:  [86-87] 86 (05/06 0510) Resp:  [16-18] 18 (05/06 0510) BP: (139-154)/(75-83) 154/83 (05/06 0510) SpO2:  [94 %-97 %] 95 % (05/06 0510) Weight:  [145.6 kg] 145.6 kg (05/06 0510)  Physical Exam: General: Alert and active, improved appearance, no acute distress. Age appropriate. Cardiac: RRR, faint heart sounds, no murmurs Respiratory: CTAB, no increased WOB, no wheezes/rales/rhonchi Abdomen: soft, nontender, markedly distended, +bowel sounds Extremities: 2+ LE edema  Skin: Warm and dry, LE venous stasis skin changes Neuro: alert and oriented x4  Brief Hospital Course:  Hypoxic respiratory failure likely 2/2 HFrEF vs COPD exacerbation Zachary Young is a 59 yo man who presented with acutely worsening dyspnea on exertion and lower extremity edema to the waist over 2 days. Patient with known history of HFmrEF and long smoking history (currently smokes 1ppd), though no diagnosis of COPD. CXR consistent with pulmonary edema and vascular congestion. Patient also had audible wheezing, which improved with  albuterol. Likely etiology is largely cardiac (due to CXR and anasarca), but could also be multifactorial with COPD a possibility as well. BNP mild at 148.2. Patient treated with 2L fluid restriction, sodium restriction, and  IV lasix 57 mg BID through 5/6 and had good response. Subsequently, patient was discharged on torsemide 80 mg BID. Weight decreased from 159.kg at admission to 145.6kg at time of discharge. Patient was able to come off O2 and ambulated with SpO2 >88%, therefore does not require O2. Anasarca had decreased but not fully resolved by time of discharge.  HFmrEF Last echo 06/22/19 at [facility] during CHF admission showed 45-50% LVEF, though poor visualization of structures. Home medications include lasix 80 mg BID, bumex 1mg  BID, bidil 20-37.5, amlodipine, metoprolol. Patient treated with sodium restriction, 2L fluid restriction, diuresis. Home medications lasix and bumex discontinued in favor of torsemide 80 mg BID. Recommend patient continue to follow with cardiologist for close monitoring and titration of diuresis drugs in the outpatient setting as needed.  Anemia Baseline hgb around 10-11. Hgb stable at 8.7 on day of discharge. Patient has CKD IV, likely related to AoCD. Will continue to monitor and if hgb drops below 8, will transfuse as patient has history of CAD. Recommend outpatient monitoring.  Tobacco abuse Discussed possibility of COPD diagnosis, especially with wheezing improvement with albuterol treatments. MDI albuterol given. Recommend smoking cessation, patient still in precontemplative stage of change. Recommend continued monitoring of readiness to quit.  Issues for Follow Up:  HFmrEF: recommend patient follow up with cardiology for management of HF and titrating oral diuresis as needed in the outpatient setting.  COPD: recommend patient receive formal PFTs to rule out COPD. Anemia: recommend periodic monitoring to keep hgb >8. Tobacco abuse: recommend cessation  Significant  Procedures: none  Significant  Labs and Imaging:  Recent Labs  Lab 08/19/19 0548 08/20/19 0449 08/20/19 2319  WBC 5.1 6.1 7.0  HGB 8.5* 8.7* 10.3*  HCT 27.5* 28.2* 32.9*  PLT 308 335 410*   Recent Labs  Lab 08/17/19 0608 08/17/19 6962 08/18/19 0526 08/18/19 0526 08/19/19 0548 08/19/19 0548 08/20/19 0449 08/20/19 2319  NA 138  --  137  --  139  --  142 137  K 4.6   < > 4.2   < > 4.4   < > 4.0 4.2  CL 107  --  103  --  106  --  106 97*  CO2 20*  --  23  --  23  --  27 27  GLUCOSE 152*  --  226*  --  196*  --  104* 306*  BUN 41*  --  42*  --  41*  --  43* 43*  CREATININE 3.93*  --  4.11*  --  3.83*  --  3.86* 3.78*  CALCIUM 7.9*  --  8.0*  --  8.5*  --  8.7* 9.2   < > = values in this interval not displayed.   PORTABLE CHEST 1 VIEW COMPARISON:  Portable exam 1123 hours compared to 05/15/2019 FINDINGS: Enlargement of cardiac silhouette with pulmonary vascular congestion. Mild diffuse BILATERAL pulmonary infiltrates likely representing pulmonary edema though infection not entirely excluded. No segmental consolidation, pleural effusion or pneumothorax. Osseous structures unremarkable. IMPRESSION: Probable pulmonary edema/CHF as above.   Electronically Signed   By: Lavonia Dana M.D.   On: 08/15/2019 12:55  US Abdomen Complete IMPRESSION: 1. Very limited exam. No definite acute abnormality identified sonographically. 2. The proximal abdominal aorta measures 2.9 cm in diameter. Ectatic abdominal aorta at risk for aneurysm development. Recommend followup by ultrasound in 5 years. This recommendation follows ACR consensus guidelines: White Paper of the ACR Incidental Findings Committee II on Vascular Findings. J Am Coll Radiol 2013; 10:789-794. Electronically Signed   By: Audie Pinto M.D.   On: 08/15/2019 16:35  Results/Tests Pending at Time of Discharge: none  Discharge Medications:  Allergies as of 08/21/2019       Reactions   Bee Venom Anaphylaxis   Per  pt, nearly died from bee sting as a child   Propofol Other (See Comments)    Hallucinations   Eggs Or Egg-derived Products Nausea And Vomiting, Other (See Comments)   Stomach cramps   Morphine Nausea And Vomiting   Oxycodone Nausea And Vomiting        Medication List     ASK your doctor about these medications    AeroChamber Plus Flo-Vu Large Misc 1 each by Other route once for 1 dose. Ask about: Should I take this medication?   albuterol 108 (90 Base) MCG/ACT inhaler Commonly known as: VENTOLIN HFA Inhale 1 puff into the lungs every 4 (four) hours as needed for wheezing or shortness of breath.   amLODipine 10 MG tablet Commonly known as: NORVASC Take 1 tablet (10 mg total) by mouth daily.   aspirin 81 MG EC tablet Take 1 tablet (81 mg total) by mouth daily.   ergocalciferol 1.25 MG (50000 UT) capsule Commonly known as: VITAMIN D2 Take 50,000 Units by mouth every Friday.   gabapentin 300 MG capsule Commonly known as: NEURONTIN Take 300-600 mg by mouth See admin instructions. Take one capsule (300 mg) by mouth every morning and two capsules (600 mg) at night   Global Inject Ease Insulin Syr 30G X 1/2" 0.5 ML  Misc Generic drug: Insulin Syringe-Needle U-100 1 each by Other route 4 (four) times daily as needed. Insulin   insulin glargine 100 UNIT/ML injection Commonly known as: LANTUS Inject 10 Units into the skin at bedtime.   insulin lispro 100 UNIT/ML injection Commonly known as: HUMALOG Inject 4 Units into the skin 3 (three) times daily before meals.   isosorbide-hydrALAZINE 20-37.5 MG tablet Commonly known as: BIDIL Take 1 tablet by mouth 2 (two) times daily.   MELATONIN PO Take 1 tablet by mouth at bedtime as needed (sleep).   metoprolol succinate 100 MG 24 hr tablet Commonly known as: TOPROL-XL Take 100 mg by mouth daily. Take with or immediately following a meal.   oxyCODONE-acetaminophen 5-325 MG tablet Commonly known as: PERCOCET/ROXICET Take 1  tablet by mouth every 8 (eight) hours as needed for pain.   pantoprazole 40 MG tablet Commonly known as: PROTONIX Take 40 mg by mouth daily as needed (acid reflux/heartburn).   PEN NEEDLES 31GX5/16" 31G X 8 MM Misc 1 each by Other route daily.   Potassium Chloride ER 20 MEQ Tbcr Take 40 mEq by mouth daily.   torsemide 20 MG tablet Commonly known as: DEMADEX Take 4 tablets (80 mg total) by mouth 2 (two) times daily.        Discharge Instructions: Please refer to Patient Instructions section of EMR for full details.  Patient was counseled important signs and symptoms that should prompt return to medical care, changes in medications, dietary instructions, activity restrictions, and follow up appointments.   Follow-Up Appointments: Follow-up Information     Dorita Fray, MD Follow up.   Specialty: Obstetrics and Gynecology Contact information: [address] [place] [address] [phone]         Clarice Pole, MD Follow up.   Specialty: Nephrology Contact information: [place]. [facility] [place] [address] [phone]            Gladys Damme, MD 08/23/2019, 4:46 PM PGY-1, [place]

## 2019-08-23 NOTE — Discharge Summary (Deleted)
  The note originally documented on this encounter has been moved the the encounter in which it belongs.  

## 2019-08-30 ENCOUNTER — Emergency Department (HOSPITAL_COMMUNITY): Payer: Medicaid Other

## 2019-08-30 ENCOUNTER — Encounter (HOSPITAL_COMMUNITY): Payer: Self-pay

## 2019-08-30 ENCOUNTER — Emergency Department (HOSPITAL_COMMUNITY)
Admission: EM | Admit: 2019-08-30 | Discharge: 2019-08-31 | Discharge status: Against Medical Advice | Payer: Medicaid Other | Attending: Family Medicine | Admitting: Family Medicine

## 2019-08-30 DIAGNOSIS — Z794 Long term (current) use of insulin: Secondary | ICD-10-CM | POA: Diagnosis not present

## 2019-08-30 DIAGNOSIS — Z79899 Other long term (current) drug therapy: Secondary | ICD-10-CM | POA: Insufficient documentation

## 2019-08-30 DIAGNOSIS — N184 Chronic kidney disease, stage 4 (severe): Secondary | ICD-10-CM | POA: Insufficient documentation

## 2019-08-30 DIAGNOSIS — E1122 Type 2 diabetes mellitus with diabetic chronic kidney disease: Secondary | ICD-10-CM | POA: Insufficient documentation

## 2019-08-30 DIAGNOSIS — F17210 Nicotine dependence, cigarettes, uncomplicated: Secondary | ICD-10-CM | POA: Diagnosis not present

## 2019-08-30 DIAGNOSIS — I110 Hypertensive heart disease with heart failure: Secondary | ICD-10-CM

## 2019-08-30 DIAGNOSIS — R0602 Shortness of breath: Secondary | ICD-10-CM | POA: Diagnosis present

## 2019-08-30 DIAGNOSIS — R0600 Dyspnea, unspecified: Secondary | ICD-10-CM | POA: Diagnosis not present

## 2019-08-30 DIAGNOSIS — I509 Heart failure, unspecified: Secondary | ICD-10-CM | POA: Insufficient documentation

## 2019-08-30 DIAGNOSIS — I130 Hypertensive heart and chronic kidney disease with heart failure and stage 1 through stage 4 chronic kidney disease, or unspecified chronic kidney disease: Secondary | ICD-10-CM | POA: Diagnosis not present

## 2019-08-30 DIAGNOSIS — Z20822 Contact with and (suspected) exposure to covid-19: Secondary | ICD-10-CM | POA: Diagnosis not present

## 2019-08-30 LAB — CBC WITH DIFFERENTIAL/PLATELET
Abs Immature Granulocytes: 0.04 10*3/uL (ref 0.00–0.07)
Basophils Absolute: 0 10*3/uL (ref 0.0–0.1)
Basophils Relative: 0 %
Eosinophils Absolute: 0.4 10*3/uL (ref 0.0–0.5)
Eosinophils Relative: 5 %
HCT: 28.7 % — ABNORMAL LOW (ref 39.0–52.0)
Hemoglobin: 8.9 g/dL — ABNORMAL LOW (ref 13.0–17.0)
Immature Granulocytes: 1 %
Lymphocytes Relative: 25 %
Lymphs Abs: 2.2 10*3/uL (ref 0.7–4.0)
MCH: 27.8 pg (ref 26.0–34.0)
MCHC: 31 g/dL (ref 30.0–36.0)
MCV: 89.7 fL (ref 80.0–100.0)
Monocytes Absolute: 0.7 10*3/uL (ref 0.1–1.0)
Monocytes Relative: 8 %
Neutro Abs: 5.5 10*3/uL (ref 1.7–7.7)
Neutrophils Relative %: 61 %
Platelets: 347 10*3/uL (ref 150–400)
RBC: 3.2 MIL/uL — ABNORMAL LOW (ref 4.22–5.81)
RDW: 16 % — ABNORMAL HIGH (ref 11.5–15.5)
WBC: 8.8 10*3/uL (ref 4.0–10.5)
nRBC: 0 % (ref 0.0–0.2)

## 2019-08-30 LAB — COMPREHENSIVE METABOLIC PANEL
ALT: 14 U/L (ref 0–44)
AST: 12 U/L — ABNORMAL LOW (ref 15–41)
Albumin: 2.3 g/dL — ABNORMAL LOW (ref 3.5–5.0)
Alkaline Phosphatase: 108 U/L (ref 38–126)
Anion gap: 12 (ref 5–15)
BUN: 49 mg/dL — ABNORMAL HIGH (ref 6–20)
CO2: 22 mmol/L (ref 22–32)
Calcium: 7.9 mg/dL — ABNORMAL LOW (ref 8.9–10.3)
Chloride: 105 mmol/L (ref 98–111)
Creatinine, Ser: 4.77 mg/dL — ABNORMAL HIGH (ref 0.61–1.24)
GFR calc Af Amer: 15 mL/min — ABNORMAL LOW (ref >60)
GFR calc non Af Amer: 13 mL/min — ABNORMAL LOW (ref >60)
Glucose, Bld: 88 mg/dL (ref 70–99)
Potassium: 4.1 mmol/L (ref 3.5–5.1)
Sodium: 139 mmol/L (ref 135–145)
Total Bilirubin: 0.4 mg/dL (ref 0.3–1.2)
Total Protein: 7.2 g/dL (ref 6.5–8.1)

## 2019-08-30 LAB — PROTIME-INR
INR: 1 (ref 0.8–1.2)
Prothrombin Time: 13.2 seconds (ref 11.4–15.2)

## 2019-08-30 LAB — TROPONIN I (HIGH SENSITIVITY): Troponin I (High Sensitivity): 17 ng/L (ref <18)

## 2019-08-30 LAB — BRAIN NATRIURETIC PEPTIDE: B Natriuretic Peptide: 48.1 pg/mL (ref 0.0–100.0)

## 2019-08-30 MED ORDER — ENOXAPARIN SODIUM 40 MG/0.4ML ~~LOC~~ SOLN: 40.0000 mg | SUBCUTANEOUS | Status: DC

## 2019-08-30 MED ORDER — ALBUTEROL SULFATE (2.5 MG/3ML) 0.083% IN NEBU
2.5000 mg | INHALATION_SOLUTION | Freq: Once | RESPIRATORY_TRACT | Status: AC
  Administered 2019-08-31: 2.5 mg via RESPIRATORY_TRACT
  Filled 2019-08-30: qty 3

## 2019-08-30 MED ORDER — AMLODIPINE BESYLATE 5 MG PO TABS: 10.0000 mg | ORAL_TABLET | Freq: Every day | ORAL | Status: DC

## 2019-08-30 MED ORDER — POTASSIUM CHLORIDE CRYS ER 20 MEQ PO TBCR: 40.0000 meq | EXTENDED_RELEASE_TABLET | Freq: Every day | ORAL | Status: DC

## 2019-08-30 MED ORDER — ALBUTEROL SULFATE (2.5 MG/3ML) 0.083% IN NEBU
2.5000 mg | INHALATION_SOLUTION | Freq: Four times a day (QID) | RESPIRATORY_TRACT | Status: DC
  Administered 2019-08-31: 2.5 mg via RESPIRATORY_TRACT
  Filled 2019-08-30: qty 3

## 2019-08-30 MED ORDER — METOPROLOL SUCCINATE ER 25 MG PO TB24: 100.0000 mg | ORAL_TABLET | Freq: Every day | ORAL | Status: DC

## 2019-08-30 MED ORDER — ALBUTEROL SULFATE (2.5 MG/3ML) 0.083% IN NEBU: 3.0000 mL | INHALATION_SOLUTION | RESPIRATORY_TRACT | Status: DC | PRN

## 2019-08-30 MED ORDER — FUROSEMIDE 10 MG/ML IJ SOLN
80.0000 mg | Freq: Once | INTRAMUSCULAR | Status: AC
  Administered 2019-08-30: 80 mg via INTRAVENOUS
  Filled 2019-08-30: qty 8

## 2019-08-30 MED ORDER — ISOSORB DINITRATE-HYDRALAZINE 20-37.5 MG PO TABS
1.0000 | ORAL_TABLET | Freq: Two times a day (BID) | ORAL | Status: DC
  Filled 2019-08-30 (×2): qty 1

## 2019-08-30 MED ORDER — INSULIN ASPART 100 UNIT/ML ~~LOC~~ SOLN: 0.0000 [IU] | Freq: Three times a day (TID) | SUBCUTANEOUS | Status: DC

## 2019-08-30 MED ORDER — PANTOPRAZOLE SODIUM 40 MG PO TBEC: 40.0000 mg | DELAYED_RELEASE_TABLET | Freq: Every day | ORAL | Status: DC | PRN

## 2019-08-30 MED ORDER — ASPIRIN EC 81 MG PO TBEC: 81.0000 mg | DELAYED_RELEASE_TABLET | Freq: Every day | ORAL | Status: DC
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
## 2019-08-30 NOTE — ED Provider Notes (Signed)
[facility] DEPART[facility] Provider Note   CSN: [id number] Arrival date & time: 08/30/19  1921     History Chief Complaint  Patient presents with  . Shortness of Breath    Zachary Young is a 59 y.o. male.  59 year old male with prior medical history as detailed below presents for evaluation of shortness of breath.  Patient reports worsening shortness of breath and lower extremity edema over the last 3 to 4 days.  Patient reports increased weight as well.  He does have a history of CHF.  His symptoms today are consistent with likely exacerbation of his CHF per his report.  He reports recent admission with change of his diuretic.  He reports compliance with his diuretics.  He has not followed up with his primary care provider following his most recent admission.  The history is provided by the patient and medical records.  Shortness of Breath Severity:  Moderate Onset quality:  Gradual Duration:  3 days Timing:  Constant Progression:  Worsening Chronicity:  Recurrent Context: activity   Relieved by:  Nothing Worsened by:  Nothing      Past Medical History:  Diagnosis Date  . CHF (congestive heart failure) ([place])   . Diabetes mellitus without complication ([place])   . Peripheral edema   . Renal disorder    kidney disease    Patient Active Problem List   Diagnosis Date Noted  . Dyspnea   . Abdominal pain   . CHF (congestive heart failure) ([place]) 08/15/2019  . Acute hypoxemic respiratory failure ([place])   . Anasarca 06/17/2019  . Gastritis 05/26/2019  . GI bleed 12/14/2018  . Chronic kidney disease, stage 4 (severe) ([place]) 06/26/2018  . Acute on chronic combined systolic and diastolic heart failure ([place]) 06/26/2018  . Moderate nonproliferative retinopathy due to secondary diabetes ([place]) 04/30/2018  . HLD (hyperlipidemia) 02/28/2018  . History of MI (myocardial infarction) 05/22/2016  . History of osteomyelitis 05/22/2016  . Tobacco use disorder  03/27/2016  . HTN (hypertension) 03/26/2016  . Type 2 diabetes mellitus with circulatory disorder, with long-term current use of insulin ([place]) 03/26/2016  . Toe amputation status 03/17/2014    Past Surgical History:  Procedure Laterality Date  . TOE AMPUTATION Right    due to osteomyelitis       History reviewed. No pertinent family history.  Social History   Tobacco Use  . Smoking status: Current Every Day Smoker    Packs/day: 0.50    Types: Cigarettes  . Smokeless tobacco: Never Used  Substance Use Topics  . Alcohol use: Not Currently  . Drug use: Never    Home Medications Prior to Admission medications   Medication Sig Start Date End Date Taking? Authorizing Provider  albuterol (VENTOLIN HFA) 108 (90 Base) MCG/ACT inhaler Inhale 1 puff into the lungs every 4 (four) hours as needed for wheezing or shortness of breath. 08/20/19   Gladys Damme, MD  amLODipine (NORVASC) 10 MG tablet Take 1 tablet (10 mg total) by mouth daily. 08/21/19   Gladys Damme, MD  aspirin EC 81 MG EC tablet Take 1 tablet (81 mg total) by mouth daily. 08/21/19   Gladys Damme, MD  ergocalciferol (VITAMIN D2) 1.25 MG (50000 UT) capsule Take 50,000 Units by mouth every Friday.  07/13/19   [provider]  gabapentin (NEURONTIN) 300 MG capsule Take 300-600 mg by mouth See admin instructions. Take one capsule (300 mg) by mouth every morning and two capsules (600 mg) at night  [provider]  insulin glargine (LANTUS) 100 UNIT/ML injection Inject 10 Units into the skin at bedtime.     [provider]  insulin lispro (HUMALOG) 100 UNIT/ML injection Inject 4 Units into the skin 3 (three) times daily before meals.     [provider]  Insulin Pen Needle (PEN NEEDLES 31GX5/16") 31G X 8 MM MISC 1 each by Other route daily. 05/16/19 05/15/20  [provider]  Insulin Syringe-Needle U-100 (GLOBAL INJECT EASE INSULIN SYR) 30G X 1/2" 0.5 ML MISC 1 each by Other route 4  (four) times daily as needed. Insulin 07/13/19   [provider]  isosorbide-hydrALAZINE (BIDIL) 20-37.5 MG tablet Take 1 tablet by mouth 2 (two) times daily.  06/08/19 06/07/20  [provider]  MELATONIN PO Take 1 tablet by mouth at bedtime as needed (sleep).  07/03/19 09/01/19  [provider]  metoprolol succinate (TOPROL-XL) 100 MG 24 hr tablet Take 100 mg by mouth daily. Take with or immediately following a meal.    [provider]  oxyCODONE-acetaminophen (PERCOCET/ROXICET) 5-325 MG tablet Take 1 tablet by mouth every 8 (eight) hours as needed for pain. 07/13/19   [provider]  pantoprazole (PROTONIX) 40 MG tablet Take 40 mg by mouth daily as needed (acid reflux/heartburn).    [provider]  potassium chloride 20 MEQ TBCR Take 40 mEq by mouth daily. 08/20/19 09/19/19  Stark Klein, MD  torsemide (DEMADEX) 20 MG tablet Take 4 tablets (80 mg total) by mouth 2 (two) times daily. 08/20/19   Gladys Damme, MD    Allergies    Bee venom, Propofol, Eggs or egg-derived products, Morphine, and Oxycodone  Review of Systems   Review of Systems  Respiratory: Positive for shortness of breath.   All other systems reviewed and are negative.   Physical Exam Updated Vital Signs BP (!) 142/85   Pulse 87   Temp 97.8 F (36.6 C) (Oral)   Resp (!) 23   Ht 6\' 3"  (1.905 m)   Wt (!) 145.6 kg   SpO2 98%   BMI 40.11 kg/m   Physical Exam Vitals and nursing note reviewed.  Constitutional:      General: He is not in acute distress.    Appearance: He is well-developed.  HENT:     Head: Normocephalic and atraumatic.  Eyes:     Conjunctiva/sclera: Conjunctivae normal.     Pupils: Pupils are equal, round, and reactive to light.  Cardiovascular:     Rate and Rhythm: Normal rate and regular rhythm.     Heart sounds: Normal heart sounds.  Pulmonary:     Effort: Pulmonary effort is normal. No respiratory distress.     Breath sounds: Normal breath  sounds.  Abdominal:     General: There is no distension.     Palpations: Abdomen is soft.     Tenderness: There is no abdominal tenderness.  Musculoskeletal:        General: No deformity. Normal range of motion.     Cervical back: Normal range of motion and neck supple.     Right lower leg: Edema present.     Left lower leg: Edema present.     Comments: 3-4+ bilateral pitting LE edema.  Skin:    General: Skin is warm and dry.  Neurological:     General: No focal deficit present.     Mental Status: He is alert and oriented to person, place, and time.     ED Results / Procedures /  Treatments   Labs (all labs ordered are listed, but only abnormal results are displayed) Labs Reviewed  CBC WITH DIFFERENTIAL/PLATELET - Abnormal; Notable for the following components:      Result Value   RBC 3.20 (*)    Hemoglobin 8.9 (*)    HCT 28.7 (*)    RDW 16.0 (*)    All other components within normal limits  COMPREHENSIVE METABOLIC PANEL - Abnormal; Notable for the following components:   BUN 49 (*)    Creatinine, Ser 4.77 (*)    Calcium 7.9 (*)    Albumin 2.3 (*)    AST 12 (*)    GFR calc non Af Amer 13 (*)    GFR calc Af Amer 15 (*)    All other components within normal limits  SARS CORONAVIRUS 2 BY RT PCR (HOSPITAL ORDER, [facility])  BRAIN NATRIURETIC PEPTIDE  PROTIME-INR  URINALYSIS, ROUTINE W REFLEX MICROSCOPIC  TROPONIN I (HIGH SENSITIVITY)  TROPONIN I (HIGH SENSITIVITY)    EKG EKG Interpretation  Date/Time:  Sunday Aug 30 2019 19:36:33 EDT Ventricular Rate:  90 PR Interval:    QRS Duration: 90 QT Interval:  373 QTC Calculation: 457 R Axis:   17 Text Interpretation: Sinus rhythm Confirmed by Dene Gentry [id number]) on 08/30/2019 7:37:55 PM   Radiology DG Chest Port 1 View  Result Date: 08/30/2019 CLINICAL DATA:  Shortness of breath. EXAM: PORTABLE CHEST 1 VIEW COMPARISON:  08/20/2019 FINDINGS: Cardiomegaly, increased. Worsening bronchial  and interstitial thickening. No large pleural effusion. No pneumothorax. Overall low lung volumes. No acute osseous abnormalities are seen. IMPRESSION: Worsened CHF over the last 10 days. Progressive cardiomegaly and pulmonary edema. Electronically Signed   By: Keith Rake M.D.   On: 08/30/2019 20:01    Procedures Procedures (including critical care time)  Medications Ordered in ED Medications  furosemide (LASIX) injection 80 mg (80 mg Intravenous Given 08/30/19 2307)    ED Course  I have reviewed the triage vital signs and the nursing notes.  Pertinent labs & imaging results that were available during my care of the patient were reviewed by me and considered in my medical decision making (see chart for details).    MDM Rules/Calculators/A&P                      MDM  Screen complete  Zachary Young was evaluated in Emergency Department on 08/30/2019 for the symptoms described in the history of present illness. He was evaluated in the context of the global COVID-19 pandemic, which necessitated consideration that the patient might be at risk for infection with the SARS-CoV-2 virus that causes COVID-19. Institutional protocols and algorithms that pertain to the evaluation of patients at risk for COVID-19 are in a state of rapid change based on information released by regulatory bodies including the CDC and federal and state organizations. These policies and algorithms were followed during the patient's care in the ED.   Patient is presenting for evaluation of worsening shortness of breath and symptoms of fluid overload.  Patient's presentation is consistent with likely CHF exacerbation.  Patient will be admitted to the family medicine teaching service for further evaluation and treatment.  Family Medicine teaching serviice is aware of case and will evaluate for admission.   Final Clinical Impression(s) / ED Diagnoses Final diagnoses:  Dyspnea, unspecified type    Rx / DC  Orders ED Discharge Orders    None       Dene Gentry  C, MD 08/30/19 2316

## 2019-08-30 NOTE — ED Notes (Signed)
Patient refused covid swab

## 2019-08-30 NOTE — H&P (Signed)
[facility] Admission History and Physical Service Pager: [phone]  Patient name: Zachary Young Medical record number: [id number] Date of birth: 10/21/1960 Age: 59 y.o. Gender: male  Primary Care Provider: System, [facility]ary Young: none Code Status: full Preferred Emergency Contact: unable to obtain.   Chief Complaint: SOB  Assessment and Plan: Zachary Young is a 59 y.o. male presenting with SOB . PMH is significant for HTN, DM, CKDIV, CHF, obesity, HLD, CAD, MI, tobacco use  Hypoxic respiratory failure likely 2/2 HFrEF exacerbation vs COPD Zachary Young is a 59 yo man presenting with SOB, concern for CHF exacerbation. Patient was recently admitted for similar problem and discharged after diuresis on room air and 145.6kg at time of discharge. He was discharged on torsemide and states that he was taking his medication. He has made an appointment to follow up with his cardiologist on 5/20. In the mean time, he believes he has been retaining fluid which has gotten noticeably worse to him in the last 3-4 days. On presentation today, his weight is unchanged from last discharge. Vital signs are stable, mildly hypertensive 130-140s/80s-90s, mildly tachypneic to low 20s, satting well on 2L [facility]. No desaturation charted, suspect this is for comfort and patient preference. On exam, patient has diffuse b/l wheeze, with decreased breath sounds on the right. Patient previously given albuterol MDI as he did not tolerate nebulizer well, but patient does not have MDI on him tonight. He agreed to use nebulizer for treatment tonight. Electrolytes WNL, LFTs largely WNL, albumin continues to be low at 2.3. BUN and cr increased to 49 and 4.65 respectively. At last admission cr elevated to 4. Patient has CKD IV and was in discussion to start HD with his nephrologist, but has not done so yet. Notably BNP WNL at 48; troponins flat at 17x2.  ABG obtained which shows mild acidosis to  7.342, but normal pCO2 at 44.4 and normal bicarb at 22. Suspect metabolic acidosis with respiratory compensation is driving normalized CO2 and bicarb. Overall patient has a mixed picture of fluid status as he has hypervolemia seen on exam and worsened on chest xray, but normal BNP and endorsing good adherence with diuretic. Patient has long smoking history and wheezing as well, improved with albuterol inhaler during last admission. Suspect some component of COPD is likely, but unconfirmed as patient has never had PFTs. Since CXR is most consistent with pulmonary edema, recommend diuresis with albuterol nebulizer. Patient is adamant about not doing CPAP/BIPAP, can re-offer to him if necessary, but ABG is reassuring as he is not a CO2 retainer. -Admit to [facility], attending Dr. Nori Young  - TSH and free T3 -Vitals per floor routine, continuous pulse ox  - O2 PRN sats >90% -Strict I's and O's -Sodium restriction -2 L fluid restriction - repeat diuresis if has good response.  -Daily weights -reassess breathing status s/p breathing treatment. consider scheduling duonebs and starting steroids for COPD exacerbation  -Am CBC and CMP  -Lovenox for DVT ppx  H/o HFrEF Last echo 08/16/2019 showed 50-55% LV EF with mild dilation and LV hypertrophy, though poor visualization of structures. In March his dry weight was estimated to be 310#, however patient is still the same weight as at last discharge, 320.9 lbs. Home medications include torsemide 80mg  BID, BiDil 20-37.5mg  PO BID - See above for management - Continue BiDil  AoCKD IV Cr 4.77, BUN 49, GFR 15 on admission. GFR appears to be 19-35 since January 2021. Follows with Dr. Radene Young, last  visit 07/20/19. BUN chronically in the 30's. UPC on 08/10/19 9.364.  Renal US 06/21/19 with normal R kidney, limited view L kidney, and PVR 315. ABG revealed acidosis, possibly metabolic with respiratory compensation. Must consider patient's somnolence as potential evidence of uremia, and  acidosis possibly from this source as well. - follow up urinalysis. (if no urine output by morning, consult nephrology) -Monitor creatinine, Mg, Phos -Urine Na, K -Consider Nephrology consult  Coronary artery disease, MI  Per [facility] - [place] notes patient sees cardiologist in [place]. Troponins trended flat at 17, no chest pain. - continue home ASA 81  -Monitor for chest pain   Diabetes CBG on admission 88, last A1c 10.5 on 07/13/19 Home meds: Lantus 10 units once daily, Humalog 3 times daily with meals, victoza 1.2 mg daily  -Sensitive sliding scale -Monitor CBGs 3 times daily and bedtime -transition to lantus + TID AC after first 24 hours  Hypertension BP today 128/71 Home meds: Amlodipine 10 mg once daily, metoprolol 100 mg daily -Continue amlodipine 10 mg -Continue Metoprolol 100mg  once daily   Hyperlipidemia -Atorvastatin 80 mg once daily  Tobacco misuse disorder Smokes 1 pack every 2 days, chronic smoker  -Nicotine patch 14mg   -Encourage smoking cessation   H/o COVID 02 Feb 2019, GI bleed, osteomyelitis post amputation of left & right toes.    FEN/GI: Heart healthy diet, sodium restriction, fluid restriction 2033ml  Prophylaxis: Lovenox 30mg   Disposition: to med-surg pending medical work up  History of Present Illness:  Zachary Young is a 59 y.o. male presenting with SOB and reported weight gain that has worsened in the last 3-4 days. Patient scheduled an appointment with his cardiologist at [facility], but could not get in until 5/20. He states he has been taking his medications as prescribed, but has been getting increasingly short of breath. He denies chest pain, cough, runny nose.  Review Of Systems: Per HPI with the following additions:   Review of Systems  Constitutional: Negative for fever.  HENT: Negative for rhinorrhea, sneezing and sore throat.   Respiratory: Positive for shortness of breath and wheezing.   Cardiovascular: Positive for leg swelling. Negative  for chest pain.  Gastrointestinal: Negative for abdominal distention, constipation, diarrhea, nausea and vomiting.  Genitourinary: Positive for scrotal swelling.  Neurological: Negative for dizziness and headaches.     Patient Active Problem List   Diagnosis Date Noted  . Dyspnea   . Abdominal pain   . CHF (congestive heart failure) ([place]) 08/15/2019  . Acute hypoxemic respiratory failure ([place])   . Anasarca 06/17/2019  . Gastritis 05/26/2019  . GI bleed 12/14/2018  . Chronic kidney disease, stage 4 (severe) ([place]) 06/26/2018  . Acute on chronic combined systolic and diastolic heart failure ([place]) 06/26/2018  . Moderate nonproliferative retinopathy due to secondary diabetes ([place]) 04/30/2018  . HLD (hyperlipidemia) 02/28/2018  . History of MI (myocardial infarction) 05/22/2016  . History of osteomyelitis 05/22/2016  . Tobacco use disorder 03/27/2016  . HTN (hypertension) 03/26/2016  . Type 2 diabetes mellitus with circulatory disorder, with long-term current use of insulin ([place]) 03/26/2016  . Toe amputation status 03/17/2014    Past Medical History: Past Medical History:  Diagnosis Date  . CHF (congestive heart failure) ([place])   . Diabetes mellitus without complication ([place])   . Peripheral edema   . Renal disorder    kidney disease    Past Surgical History: Past Surgical History:  Procedure Laterality Date  . TOE AMPUTATION Right    due to osteomyelitis  Social History: Social History   Tobacco Use  . Smoking status: Current Every Day Smoker    Packs/day: 0.50    Types: Cigarettes  . Smokeless tobacco: Never Used  Substance Use Topics  . Alcohol use: Not Currently  . Drug use: Never   Additional social history: patient was recently asked to leave his brother's house. He has a difficult housing situation due to parole and sex offender status. Please also refer to relevant sections of EMR.  Family History: History reviewed. No pertinent family  history.  Allergies and Medications: Allergies  Allergen Reactions  . Bee Venom Anaphylaxis    Per pt, nearly died from bee sting as a child  . Propofol Other (See Comments)     Hallucinations  . Eggs Or Egg-Derived Products Nausea And Vomiting and Other (See Comments)    Stomach cramps  . Morphine Nausea And Vomiting  . Oxycodone Nausea And Vomiting   No current facility-administered medications on file prior to encounter.   Current Outpatient Medications on File Prior to Encounter  Medication Sig Dispense Refill  . albuterol (VENTOLIN HFA) 108 (90 Base) MCG/ACT inhaler Inhale 1 puff into the lungs every 4 (four) hours as needed for wheezing or shortness of breath. 6.7 g 0  . amLODipine (NORVASC) 10 MG tablet Take 1 tablet (10 mg total) by mouth daily. 30 tablet 0  . aspirin EC 81 MG EC tablet Take 1 tablet (81 mg total) by mouth daily. 30 tablet 0  . ergocalciferol (VITAMIN D2) 1.25 MG (50000 UT) capsule Take 50,000 Units by mouth every Friday.     . gabapentin (NEURONTIN) 300 MG capsule Take 300-600 mg by mouth See admin instructions. Take one capsule (300 mg) by mouth every morning and two capsules (600 mg) at night    . insulin glargine (LANTUS) 100 UNIT/ML injection Inject 10 Units into the skin at bedtime.     . insulin lispro (HUMALOG) 100 UNIT/ML injection Inject 4 Units into the skin 3 (three) times daily before meals.     . isosorbide-hydrALAZINE (BIDIL) 20-37.5 MG tablet Take 1 tablet by mouth 2 (two) times daily.     Marland Kitchen MELATONIN PO Take 1 tablet by mouth at bedtime as needed (sleep).     . metoprolol succinate (TOPROL-XL) 100 MG 24 hr tablet Take 100 mg by mouth daily. Take with or immediately following a meal.    . pantoprazole (PROTONIX) 40 MG tablet Take 40 mg by mouth daily as needed (acid reflux/heartburn).    . potassium chloride 20 MEQ TBCR Take 40 mEq by mouth daily. 60 tablet 0  . potassium chloride SA (KLOR-CON) 20 MEQ tablet Take 40 mEq by mouth daily.    Marland Kitchen  torsemide (DEMADEX) 20 MG tablet Take 4 tablets (80 mg total) by mouth 2 (two) times daily. 60 tablet 0  . Insulin Pen Needle (PEN NEEDLES 31GX5/16") 31G X 8 MM MISC 1 each by Other route daily.    . Insulin Syringe-Needle U-100 (GLOBAL INJECT EASE INSULIN SYR) 30G X 1/2" 0.5 ML MISC 1 each by Other route 4 (four) times daily as needed. Insulin      Objective: BP 128/71   Pulse 87   Temp 97.8 F (36.6 C) (Oral)   Resp 17   Ht 6\' 3"  (1.905 m)   Wt (!) 145.6 kg   SpO2 100%   BMI 40.11 kg/m  Exam: General: older, obese, AA man sleeping in bed, NAD Eyes: anicteric sclerae ENTM: clear oropharynx Neck: supple  Cardiovascular: distant heart sounds, RRR, no m/r/g Respiratory: diffuse b/l expiratory wheeze with decreased air sounds on R lower Gastrointestinal: soft, NT, ND, normal bowel sounds MSK: warm, dry, very edematous LEs, no toes b/l Derm: scale present on b/l LEs Neuro: patient sleepy, but rousable. Answering appropriately. No focal deficits Psych: normal mood, affect blunted due to somnolence  Labs and Imaging: CBC BMET  Recent Labs  Lab 08/31/19 0014  WBC 9.4  HGB 8.7*  9.5*  HCT 28.7*  28.0*  PLT 362   Recent Labs  Lab 08/31/19 0014  NA 139  142  K 4.1  4.0  CL 107  CO2 22  BUN 49*  CREATININE 4.65*  GLUCOSE 60*  CALCIUM 7.9*     EKG: EKG Interpretation  Date/Time:  Sunday Aug 30 2019 19:36:33 EDT Ventricular Rate:  90 PR Interval:    QRS Duration: 90 QT Interval:  373 QTC Calculation: 457 R Axis:   17 Text Interpretation: Sinus rhythm Confirmed by Dene Gentry [phone]) on 08/30/2019 7:37:55 PM   DG Chest Port 1 View  Result Date: 08/30/2019 CLINICAL DATA:  Shortness of breath. EXAM: PORTABLE CHEST 1 VIEW COMPARISON:  08/20/2019 FINDINGS: Cardiomegaly, increased. Worsening bronchial and interstitial thickening. No large pleural effusion. No pneumothorax. Overall low lung volumes. No acute osseous abnormalities are seen. IMPRESSION: Worsened CHF  over the last 10 days. Progressive cardiomegaly and pulmonary edema. Electronically Signed   By: Keith Rake M.D.   On: 08/30/2019 20:01   Gladys Damme, MD 08/31/2019, 1:31 AM PGY-1, [facility] Intern pager: [phone], text pages welcome

## 2019-08-31 LAB — BASIC METABOLIC PANEL
Anion gap: 10 (ref 5–15)
BUN: 49 mg/dL — ABNORMAL HIGH (ref 6–20)
CO2: 22 mmol/L (ref 22–32)
Calcium: 7.9 mg/dL — ABNORMAL LOW (ref 8.9–10.3)
Chloride: 107 mmol/L (ref 98–111)
Creatinine, Ser: 4.65 mg/dL — ABNORMAL HIGH (ref 0.61–1.24)
GFR calc Af Amer: 15 mL/min — ABNORMAL LOW (ref >60)
GFR calc non Af Amer: 13 mL/min — ABNORMAL LOW (ref >60)
Glucose, Bld: 60 mg/dL — ABNORMAL LOW (ref 70–99)
Potassium: 4.1 mmol/L (ref 3.5–5.1)
Sodium: 139 mmol/L (ref 135–145)

## 2019-08-31 LAB — POCT I-STAT 7, (LYTES, BLD GAS, ICA,H+H)
Acid-base deficit: 2 mmol/L (ref 0.0–2.0)
Bicarbonate: 24.1 mmol/L (ref 20.0–28.0)
Calcium, Ion: 1.15 mmol/L (ref 1.15–1.40)
HCT: 28 % — ABNORMAL LOW (ref 39.0–52.0)
Hemoglobin: 9.5 g/dL — ABNORMAL LOW (ref 13.0–17.0)
O2 Saturation: 99 %
Patient temperature: 98.6
Potassium: 4 mmol/L (ref 3.5–5.1)
Sodium: 142 mmol/L (ref 135–145)
TCO2: 25 mmol/L (ref 22–32)
pCO2 arterial: 44.4 mmHg (ref 32.0–48.0)
pH, Arterial: 7.342 — ABNORMAL LOW (ref 7.350–7.450)
pO2, Arterial: 146 mmHg — ABNORMAL HIGH (ref 83.0–108.0)

## 2019-08-31 LAB — CBC
HCT: 28.7 % — ABNORMAL LOW (ref 39.0–52.0)
Hemoglobin: 8.7 g/dL — ABNORMAL LOW (ref 13.0–17.0)
MCH: 27.5 pg (ref 26.0–34.0)
MCHC: 30.3 g/dL (ref 30.0–36.0)
MCV: 90.8 fL (ref 80.0–100.0)
Platelets: 362 10*3/uL (ref 150–400)
RBC: 3.16 MIL/uL — ABNORMAL LOW (ref 4.22–5.81)
RDW: 15.9 % — ABNORMAL HIGH (ref 11.5–15.5)
WBC: 9.4 10*3/uL (ref 4.0–10.5)
nRBC: 0 % (ref 0.0–0.2)

## 2019-08-31 LAB — TROPONIN I (HIGH SENSITIVITY): Troponin I (High Sensitivity): 17 ng/L (ref <18)

## 2019-08-31 LAB — SARS CORONAVIRUS 2 BY RT PCR (HOSPITAL ORDER, PERFORMED IN ~~LOC~~ HOSPITAL LAB): SARS Coronavirus 2: NEGATIVE

## 2019-08-31 NOTE — Progress Notes (Signed)
RT obtained ABG.

## 2019-08-31 NOTE — ED Notes (Signed)
Family medicine intern paged to notify that pt left AMA, awaiting return call to confirm receipt of page.

## 2019-08-31 NOTE — ED Notes (Signed)
RN administered ordered nebulizer treatment at 03:44 and then placed the pt back on his [place] at 4 AM. Pt was resting comfortably at that time. At 04:15 pt was heard yelling and cussing in his room and staff found him to be ripping off monitoring and IV. Pt yelling at staff " I hit my call light 20 minutes ago, get me out of here I want to go to [facility] where my real doctors are". No call light was on at the time of this incident, call light was testing and was working properly. RN attempted to talk with pt but pt continued to to curse at staff. Security at bedside and charge nurse notified. RN informed pt of serious consequences that may occur if he leaves including serious injury or death. Pt alert and oriented and verbalized understanding. Pt refused last set of vital and ambulated to exit with security.

## 2019-08-31 NOTE — ED Notes (Signed)
RN received confirmation from Dr. Chauncey Reading that she is aware pt left AMA.

## 2019-09-09 DIAGNOSIS — A09 Infectious gastroenteritis and colitis, unspecified: Secondary | ICD-10-CM | POA: Insufficient documentation

## 2019-09-24 DIAGNOSIS — N049 Nephrotic syndrome with unspecified morphologic changes: Secondary | ICD-10-CM | POA: Insufficient documentation

## 2019-09-24 DIAGNOSIS — N179 Acute kidney failure, unspecified: Secondary | ICD-10-CM | POA: Insufficient documentation

## 2019-10-17 ENCOUNTER — Other Ambulatory Visit: Payer: Self-pay

## 2019-10-17 ENCOUNTER — Inpatient Hospital Stay (HOSPITAL_COMMUNITY)
Admission: EM | Admit: 2019-10-17 | Discharge: 2019-12-12 | DRG: 617 | Disposition: A | Discharge status: Home / Self Care | Payer: Medicaid Other | Attending: Internal Medicine | Admitting: Internal Medicine

## 2019-10-17 ENCOUNTER — Encounter (HOSPITAL_COMMUNITY): Payer: Self-pay

## 2019-10-17 ENCOUNTER — Inpatient Hospital Stay (HOSPITAL_COMMUNITY): Payer: Medicaid Other

## 2019-10-17 DIAGNOSIS — E1140 Type 2 diabetes mellitus with diabetic neuropathy, unspecified: Secondary | ICD-10-CM | POA: Diagnosis present

## 2019-10-17 DIAGNOSIS — M86271 Subacute osteomyelitis, right ankle and foot: Secondary | ICD-10-CM | POA: Diagnosis present

## 2019-10-17 DIAGNOSIS — Z7189 Other specified counseling: Secondary | ICD-10-CM

## 2019-10-17 DIAGNOSIS — E669 Obesity, unspecified: Secondary | ICD-10-CM | POA: Diagnosis not present

## 2019-10-17 DIAGNOSIS — R112 Nausea with vomiting, unspecified: Secondary | ICD-10-CM | POA: Diagnosis not present

## 2019-10-17 DIAGNOSIS — R700 Elevated erythrocyte sedimentation rate: Secondary | ICD-10-CM | POA: Diagnosis not present

## 2019-10-17 DIAGNOSIS — T8781 Dehiscence of amputation stump: Secondary | ICD-10-CM | POA: Diagnosis not present

## 2019-10-17 DIAGNOSIS — N2581 Secondary hyperparathyroidism of renal origin: Secondary | ICD-10-CM | POA: Diagnosis present

## 2019-10-17 DIAGNOSIS — E1152 Type 2 diabetes mellitus with diabetic peripheral angiopathy with gangrene: Secondary | ICD-10-CM | POA: Diagnosis present

## 2019-10-17 DIAGNOSIS — E872 Acidosis, unspecified: Secondary | ICD-10-CM | POA: Diagnosis present

## 2019-10-17 DIAGNOSIS — E1365 Other specified diabetes mellitus with hyperglycemia: Secondary | ICD-10-CM | POA: Diagnosis not present

## 2019-10-17 DIAGNOSIS — Z515 Encounter for palliative care: Secondary | ICD-10-CM | POA: Diagnosis not present

## 2019-10-17 DIAGNOSIS — Y835 Amputation of limb(s) as the cause of abnormal reaction of the patient, or of later complication, without mention of misadventure at the time of the procedure: Secondary | ICD-10-CM | POA: Diagnosis not present

## 2019-10-17 DIAGNOSIS — M7989 Other specified soft tissue disorders: Secondary | ICD-10-CM | POA: Diagnosis not present

## 2019-10-17 DIAGNOSIS — I5042 Chronic combined systolic (congestive) and diastolic (congestive) heart failure: Secondary | ICD-10-CM | POA: Diagnosis present

## 2019-10-17 DIAGNOSIS — I70261 Atherosclerosis of native arteries of extremities with gangrene, right leg: Secondary | ICD-10-CM | POA: Diagnosis present

## 2019-10-17 DIAGNOSIS — D638 Anemia in other chronic diseases classified elsewhere: Secondary | ICD-10-CM | POA: Diagnosis present

## 2019-10-17 DIAGNOSIS — M79609 Pain in unspecified limb: Secondary | ICD-10-CM | POA: Diagnosis not present

## 2019-10-17 DIAGNOSIS — D62 Acute posthemorrhagic anemia: Secondary | ICD-10-CM | POA: Diagnosis not present

## 2019-10-17 DIAGNOSIS — E8809 Other disorders of plasma-protein metabolism, not elsewhere classified: Secondary | ICD-10-CM | POA: Diagnosis present

## 2019-10-17 DIAGNOSIS — I872 Venous insufficiency (chronic) (peripheral): Secondary | ICD-10-CM | POA: Diagnosis present

## 2019-10-17 DIAGNOSIS — D631 Anemia in chronic kidney disease: Secondary | ICD-10-CM | POA: Diagnosis present

## 2019-10-17 DIAGNOSIS — F17210 Nicotine dependence, cigarettes, uncomplicated: Secondary | ICD-10-CM | POA: Diagnosis present

## 2019-10-17 DIAGNOSIS — Z79899 Other long term (current) drug therapy: Secondary | ICD-10-CM

## 2019-10-17 DIAGNOSIS — R197 Diarrhea, unspecified: Secondary | ICD-10-CM | POA: Diagnosis not present

## 2019-10-17 DIAGNOSIS — Z833 Family history of diabetes mellitus: Secondary | ICD-10-CM

## 2019-10-17 DIAGNOSIS — F17200 Nicotine dependence, unspecified, uncomplicated: Secondary | ICD-10-CM | POA: Diagnosis present

## 2019-10-17 DIAGNOSIS — E785 Hyperlipidemia, unspecified: Secondary | ICD-10-CM | POA: Diagnosis not present

## 2019-10-17 DIAGNOSIS — Z794 Long term (current) use of insulin: Secondary | ICD-10-CM | POA: Diagnosis not present

## 2019-10-17 DIAGNOSIS — L02611 Cutaneous abscess of right foot: Secondary | ICD-10-CM | POA: Diagnosis present

## 2019-10-17 DIAGNOSIS — R55 Syncope and collapse: Secondary | ICD-10-CM | POA: Diagnosis present

## 2019-10-17 DIAGNOSIS — Z992 Dependence on renal dialysis: Secondary | ICD-10-CM | POA: Diagnosis not present

## 2019-10-17 DIAGNOSIS — E11621 Type 2 diabetes mellitus with foot ulcer: Secondary | ICD-10-CM | POA: Diagnosis present

## 2019-10-17 DIAGNOSIS — M79604 Pain in right leg: Secondary | ICD-10-CM | POA: Diagnosis not present

## 2019-10-17 DIAGNOSIS — I10 Essential (primary) hypertension: Secondary | ICD-10-CM | POA: Diagnosis present

## 2019-10-17 DIAGNOSIS — Z7982 Long term (current) use of aspirin: Secondary | ICD-10-CM

## 2019-10-17 DIAGNOSIS — Z87898 Personal history of other specified conditions: Secondary | ICD-10-CM | POA: Diagnosis not present

## 2019-10-17 DIAGNOSIS — N179 Acute kidney failure, unspecified: Secondary | ICD-10-CM | POA: Diagnosis not present

## 2019-10-17 DIAGNOSIS — E875 Hyperkalemia: Secondary | ICD-10-CM | POA: Diagnosis not present

## 2019-10-17 DIAGNOSIS — Z8241 Family history of sudden cardiac death: Secondary | ICD-10-CM

## 2019-10-17 DIAGNOSIS — T8789 Other complications of amputation stump: Secondary | ICD-10-CM | POA: Diagnosis not present

## 2019-10-17 DIAGNOSIS — E1122 Type 2 diabetes mellitus with diabetic chronic kidney disease: Secondary | ICD-10-CM | POA: Diagnosis present

## 2019-10-17 DIAGNOSIS — L97519 Non-pressure chronic ulcer of other part of right foot with unspecified severity: Secondary | ICD-10-CM | POA: Diagnosis present

## 2019-10-17 DIAGNOSIS — R0602 Shortness of breath: Secondary | ICD-10-CM

## 2019-10-17 DIAGNOSIS — I130 Hypertensive heart and chronic kidney disease with heart failure and stage 1 through stage 4 chronic kidney disease, or unspecified chronic kidney disease: Secondary | ICD-10-CM | POA: Diagnosis not present

## 2019-10-17 DIAGNOSIS — Z885 Allergy status to narcotic agent status: Secondary | ICD-10-CM

## 2019-10-17 DIAGNOSIS — G546 Phantom limb syndrome with pain: Secondary | ICD-10-CM | POA: Diagnosis not present

## 2019-10-17 DIAGNOSIS — N184 Chronic kidney disease, stage 4 (severe): Secondary | ICD-10-CM | POA: Diagnosis not present

## 2019-10-17 DIAGNOSIS — E1159 Type 2 diabetes mellitus with other circulatory complications: Secondary | ICD-10-CM | POA: Diagnosis present

## 2019-10-17 DIAGNOSIS — R7982 Elevated C-reactive protein (CRP): Secondary | ICD-10-CM | POA: Diagnosis not present

## 2019-10-17 DIAGNOSIS — Z9119 Patient's noncompliance with other medical treatment and regimen: Secondary | ICD-10-CM

## 2019-10-17 DIAGNOSIS — E1169 Type 2 diabetes mellitus with other specified complication: Secondary | ICD-10-CM | POA: Diagnosis present

## 2019-10-17 DIAGNOSIS — I132 Hypertensive heart and chronic kidney disease with heart failure and with stage 5 chronic kidney disease, or end stage renal disease: Secondary | ICD-10-CM | POA: Diagnosis present

## 2019-10-17 DIAGNOSIS — Z89422 Acquired absence of other left toe(s): Secondary | ICD-10-CM

## 2019-10-17 DIAGNOSIS — Z20822 Contact with and (suspected) exposure to covid-19: Secondary | ICD-10-CM | POA: Diagnosis present

## 2019-10-17 DIAGNOSIS — E11649 Type 2 diabetes mellitus with hypoglycemia without coma: Secondary | ICD-10-CM | POA: Diagnosis not present

## 2019-10-17 DIAGNOSIS — Z653 Problems related to other legal circumstances: Secondary | ICD-10-CM

## 2019-10-17 DIAGNOSIS — Z5320 Procedure and treatment not carried out because of patient's decision for unspecified reasons: Secondary | ICD-10-CM | POA: Diagnosis not present

## 2019-10-17 DIAGNOSIS — T8131XA Disruption of external operation (surgical) wound, not elsewhere classified, initial encounter: Secondary | ICD-10-CM

## 2019-10-17 DIAGNOSIS — N186 End stage renal disease: Secondary | ICD-10-CM

## 2019-10-17 DIAGNOSIS — F1410 Cocaine abuse, uncomplicated: Secondary | ICD-10-CM | POA: Diagnosis present

## 2019-10-17 DIAGNOSIS — Y92239 Unspecified place in hospital as the place of occurrence of the external cause: Secondary | ICD-10-CM | POA: Diagnosis not present

## 2019-10-17 DIAGNOSIS — N189 Chronic kidney disease, unspecified: Secondary | ICD-10-CM

## 2019-10-17 DIAGNOSIS — E7800 Pure hypercholesterolemia, unspecified: Secondary | ICD-10-CM | POA: Diagnosis not present

## 2019-10-17 DIAGNOSIS — E1142 Type 2 diabetes mellitus with diabetic polyneuropathy: Secondary | ICD-10-CM | POA: Diagnosis present

## 2019-10-17 DIAGNOSIS — W19XXXA Unspecified fall, initial encounter: Secondary | ICD-10-CM | POA: Diagnosis not present

## 2019-10-17 DIAGNOSIS — Z89412 Acquired absence of left great toe: Secondary | ICD-10-CM

## 2019-10-17 DIAGNOSIS — R5381 Other malaise: Secondary | ICD-10-CM | POA: Diagnosis not present

## 2019-10-17 DIAGNOSIS — Z91030 Bee allergy status: Secondary | ICD-10-CM

## 2019-10-17 DIAGNOSIS — E66813 Obesity, class 3: Secondary | ICD-10-CM | POA: Diagnosis present

## 2019-10-17 DIAGNOSIS — K5900 Constipation, unspecified: Secondary | ICD-10-CM | POA: Diagnosis not present

## 2019-10-17 DIAGNOSIS — L0390 Cellulitis, unspecified: Secondary | ICD-10-CM | POA: Diagnosis not present

## 2019-10-17 DIAGNOSIS — IMO0002 Reserved for concepts with insufficient information to code with codable children: Secondary | ICD-10-CM | POA: Diagnosis present

## 2019-10-17 DIAGNOSIS — S91301A Unspecified open wound, right foot, initial encounter: Secondary | ICD-10-CM | POA: Diagnosis not present

## 2019-10-17 DIAGNOSIS — Z590 Homelessness: Secondary | ICD-10-CM

## 2019-10-17 DIAGNOSIS — Z89511 Acquired absence of right leg below knee: Secondary | ICD-10-CM

## 2019-10-17 DIAGNOSIS — E138 Other specified diabetes mellitus with unspecified complications: Secondary | ICD-10-CM | POA: Diagnosis not present

## 2019-10-17 DIAGNOSIS — I953 Hypotension of hemodialysis: Secondary | ICD-10-CM | POA: Diagnosis not present

## 2019-10-17 DIAGNOSIS — Z560 Unemployment, unspecified: Secondary | ICD-10-CM

## 2019-10-17 DIAGNOSIS — N185 Chronic kidney disease, stage 5: Secondary | ICD-10-CM | POA: Diagnosis not present

## 2019-10-17 DIAGNOSIS — E1165 Type 2 diabetes mellitus with hyperglycemia: Secondary | ICD-10-CM | POA: Diagnosis present

## 2019-10-17 DIAGNOSIS — I252 Old myocardial infarction: Secondary | ICD-10-CM

## 2019-10-17 DIAGNOSIS — I110 Hypertensive heart disease with heart failure: Secondary | ICD-10-CM | POA: Diagnosis present

## 2019-10-17 DIAGNOSIS — Z66 Do not resuscitate: Secondary | ICD-10-CM | POA: Diagnosis not present

## 2019-10-17 DIAGNOSIS — Z6837 Body mass index (BMI) 37.0-37.9, adult: Secondary | ICD-10-CM

## 2019-10-17 DIAGNOSIS — F919 Conduct disorder, unspecified: Secondary | ICD-10-CM | POA: Diagnosis present

## 2019-10-17 LAB — BASIC METABOLIC PANEL
Anion gap: 14 (ref 5–15)
BUN: 97 mg/dL — ABNORMAL HIGH (ref 6–20)
CO2: 19 mmol/L — ABNORMAL LOW (ref 22–32)
Calcium: 7.3 mg/dL — ABNORMAL LOW (ref 8.9–10.3)
Chloride: 103 mmol/L (ref 98–111)
Creatinine, Ser: 5.74 mg/dL — ABNORMAL HIGH (ref 0.61–1.24)
GFR calc Af Amer: 12 mL/min — ABNORMAL LOW (ref >60)
GFR calc non Af Amer: 10 mL/min — ABNORMAL LOW (ref >60)
Glucose, Bld: 264 mg/dL — ABNORMAL HIGH (ref 70–99)
Potassium: 3.5 mmol/L (ref 3.5–5.1)
Sodium: 136 mmol/L (ref 135–145)

## 2019-10-17 LAB — CBG MONITORING, ED: Glucose-Capillary: 258 mg/dL — ABNORMAL HIGH (ref 70–99)

## 2019-10-17 LAB — CREATININE, SERUM
Creatinine, Ser: 5.45 mg/dL — ABNORMAL HIGH (ref 0.61–1.24)
GFR calc Af Amer: 12 mL/min — ABNORMAL LOW (ref >60)
GFR calc non Af Amer: 11 mL/min — ABNORMAL LOW (ref >60)

## 2019-10-17 LAB — GLUCOSE, CAPILLARY: Glucose-Capillary: 325 mg/dL — ABNORMAL HIGH (ref 70–99)

## 2019-10-17 LAB — SARS CORONAVIRUS 2 BY RT PCR (HOSPITAL ORDER, PERFORMED IN ~~LOC~~ HOSPITAL LAB): SARS Coronavirus 2: NEGATIVE

## 2019-10-17 LAB — CBC
HCT: 31.3 % — ABNORMAL LOW (ref 39.0–52.0)
Hemoglobin: 9.7 g/dL — ABNORMAL LOW (ref 13.0–17.0)
MCH: 25.6 pg — ABNORMAL LOW (ref 26.0–34.0)
MCHC: 31 g/dL (ref 30.0–36.0)
MCV: 82.6 fL (ref 80.0–100.0)
Platelets: 442 10*3/uL — ABNORMAL HIGH (ref 150–400)
RBC: 3.79 MIL/uL — ABNORMAL LOW (ref 4.22–5.81)
RDW: 14.6 % (ref 11.5–15.5)
WBC: 9.7 10*3/uL (ref 4.0–10.5)
nRBC: 0 % (ref 0.0–0.2)

## 2019-10-17 LAB — HEMOGLOBIN A1C
Hgb A1c MFr Bld: 8.8 % — ABNORMAL HIGH (ref 4.8–5.6)
Mean Plasma Glucose: 205.86 mg/dL

## 2019-10-17 MED ORDER — OXYCODONE-ACETAMINOPHEN 5-325 MG PO TABS
1.0000 | ORAL_TABLET | Freq: Four times a day (QID) | ORAL | Status: DC | PRN
  Administered 2019-10-17 – 2019-10-23 (×16): 2 via ORAL
  Administered 2019-10-24: 1 via ORAL
  Administered 2019-10-24: 2 via ORAL
  Administered 2019-10-25 (×2): 1 via ORAL
  Administered 2019-10-26 (×2): 2 via ORAL
  Administered 2019-10-27: 1 via ORAL
  Administered 2019-10-27: 2 via ORAL
  Administered 2019-10-28 – 2019-11-07 (×28): 1 via ORAL
  Filled 2019-10-17: qty 2
  Filled 2019-10-17 (×4): qty 1
  Filled 2019-10-17 (×2): qty 2
  Filled 2019-10-17: qty 1
  Filled 2019-10-17 (×2): qty 2
  Filled 2019-10-17 (×2): qty 1
  Filled 2019-10-17: qty 2
  Filled 2019-10-17: qty 1
  Filled 2019-10-17: qty 2
  Filled 2019-10-17 (×2): qty 1
  Filled 2019-10-17 (×2): qty 2
  Filled 2019-10-17: qty 1
  Filled 2019-10-17: qty 2
  Filled 2019-10-17: qty 1
  Filled 2019-10-17 (×2): qty 2
  Filled 2019-10-17 (×4): qty 1
  Filled 2019-10-17 (×3): qty 2
  Filled 2019-10-17 (×3): qty 1
  Filled 2019-10-17: qty 2
  Filled 2019-10-17: qty 1
  Filled 2019-10-17 (×2): qty 2
  Filled 2019-10-17: qty 1
  Filled 2019-10-17: qty 2
  Filled 2019-10-17: qty 1
  Filled 2019-10-17: qty 2
  Filled 2019-10-17: qty 1
  Filled 2019-10-17 (×4): qty 2
  Filled 2019-10-17 (×4): qty 1
  Filled 2019-10-17: qty 2

## 2019-10-17 MED ORDER — SODIUM CHLORIDE 0.9% FLUSH
3.0000 mL | Freq: Two times a day (BID) | INTRAVENOUS | Status: DC
  Administered 2019-10-18 – 2019-12-04 (×83): 3 mL via INTRAVENOUS

## 2019-10-17 MED ORDER — ONDANSETRON HCL 4 MG PO TABS
4.0000 mg | ORAL_TABLET | Freq: Four times a day (QID) | ORAL | Status: DC | PRN
  Administered 2019-11-08 – 2019-11-29 (×2): 4 mg via ORAL
  Filled 2019-10-17 (×2): qty 1

## 2019-10-17 MED ORDER — GABAPENTIN 300 MG PO CAPS
600.0000 mg | ORAL_CAPSULE | Freq: Every day | ORAL | Status: DC
  Administered 2019-10-17 – 2019-11-07 (×22): 600 mg via ORAL
  Filled 2019-10-17 (×22): qty 2

## 2019-10-17 MED ORDER — VANCOMYCIN VARIABLE DOSE PER UNSTABLE RENAL FUNCTION (PHARMACIST DOSING): Status: DC

## 2019-10-17 MED ORDER — ONDANSETRON HCL 4 MG/2ML IJ SOLN
4.0000 mg | Freq: Four times a day (QID) | INTRAMUSCULAR | Status: DC | PRN
  Administered 2019-10-24 – 2019-12-01 (×9): 4 mg via INTRAVENOUS
  Filled 2019-10-17 (×9): qty 2

## 2019-10-17 MED ORDER — GABAPENTIN 300 MG PO CAPS: 300.0000 mg | ORAL_CAPSULE | ORAL | Status: DC

## 2019-10-17 MED ORDER — VANCOMYCIN HCL 10 G IV SOLR
2250.0000 mg | Freq: Once | INTRAVENOUS | Status: AC
  Administered 2019-10-18: 2250 mg via INTRAVENOUS
  Filled 2019-10-17: qty 2250

## 2019-10-17 MED ORDER — ALBUTEROL SULFATE HFA 108 (90 BASE) MCG/ACT IN AERS
1.0000 | INHALATION_SPRAY | RESPIRATORY_TRACT | Status: DC | PRN
  Filled 2019-10-17: qty 6.7

## 2019-10-17 MED ORDER — SODIUM CHLORIDE 0.9% FLUSH
3.0000 mL | Freq: Once | INTRAVENOUS | Status: AC
  Administered 2019-10-17: 3 mL via INTRAVENOUS

## 2019-10-17 MED ORDER — METRONIDAZOLE IN NACL 5-0.79 MG/ML-% IV SOLN
500.0000 mg | Freq: Three times a day (TID) | INTRAVENOUS | Status: DC
  Administered 2019-10-17 – 2019-10-22 (×15): 500 mg via INTRAVENOUS
  Filled 2019-10-17 (×15): qty 100

## 2019-10-17 MED ORDER — SODIUM CHLORIDE 0.9 % IV BOLUS
500.0000 mL | Freq: Once | INTRAVENOUS | Status: AC
  Administered 2019-10-17: 500 mL via INTRAVENOUS

## 2019-10-17 MED ORDER — AMLODIPINE BESYLATE 10 MG PO TABS
10.0000 mg | ORAL_TABLET | Freq: Every day | ORAL | Status: DC
  Administered 2019-10-17 – 2019-11-03 (×18): 10 mg via ORAL
  Filled 2019-10-17 (×18): qty 1

## 2019-10-17 MED ORDER — ENOXAPARIN SODIUM 30 MG/0.3ML ~~LOC~~ SOLN
30.0000 mg | SUBCUTANEOUS | Status: DC
  Administered 2019-10-17 – 2019-11-04 (×19): 30 mg via SUBCUTANEOUS
  Filled 2019-10-17 (×20): qty 0.3

## 2019-10-17 MED ORDER — HYDROMORPHONE HCL 1 MG/ML IJ SOLN
0.5000 mg | INTRAMUSCULAR | Status: DC | PRN
  Administered 2019-10-17 – 2019-10-22 (×3): 1 mg via INTRAVENOUS
  Filled 2019-10-17 (×3): qty 1

## 2019-10-17 MED ORDER — ALBUTEROL SULFATE (2.5 MG/3ML) 0.083% IN NEBU: 2.5000 mg | INHALATION_SOLUTION | RESPIRATORY_TRACT | Status: DC | PRN

## 2019-10-17 MED ORDER — ASPIRIN EC 81 MG PO TBEC
81.0000 mg | DELAYED_RELEASE_TABLET | Freq: Every day | ORAL | Status: DC
  Administered 2019-10-17 – 2019-12-12 (×56): 81 mg via ORAL
  Filled 2019-10-17 (×57): qty 1

## 2019-10-17 MED ORDER — INSULIN GLARGINE 100 UNIT/ML ~~LOC~~ SOLN
10.0000 [IU] | Freq: Every day | SUBCUTANEOUS | Status: DC
  Administered 2019-10-17 – 2019-10-25 (×10): 10 [IU] via SUBCUTANEOUS
  Filled 2019-10-17 (×13): qty 0.1

## 2019-10-17 MED ORDER — FENTANYL CITRATE (PF) 100 MCG/2ML IJ SOLN
100.0000 ug | Freq: Once | INTRAMUSCULAR | Status: DC
  Filled 2019-10-17: qty 2

## 2019-10-17 MED ORDER — INSULIN ASPART 100 UNIT/ML ~~LOC~~ SOLN
0.0000 [IU] | Freq: Three times a day (TID) | SUBCUTANEOUS | Status: DC
  Administered 2019-10-18 (×2): 4 [IU] via SUBCUTANEOUS
  Administered 2019-10-18 – 2019-10-19 (×2): 7 [IU] via SUBCUTANEOUS
  Administered 2019-10-19 (×2): 3 [IU] via SUBCUTANEOUS
  Administered 2019-10-20: 11 [IU] via SUBCUTANEOUS
  Administered 2019-10-20 – 2019-10-21 (×3): 3 [IU] via SUBCUTANEOUS
  Administered 2019-10-21 – 2019-10-22 (×2): 11 [IU] via SUBCUTANEOUS
  Administered 2019-10-22: 5 [IU] via SUBCUTANEOUS
  Administered 2019-10-22 – 2019-10-23 (×2): 4 [IU] via SUBCUTANEOUS
  Administered 2019-10-23: 7 [IU] via SUBCUTANEOUS
  Administered 2019-10-23: 3 [IU] via SUBCUTANEOUS
  Administered 2019-10-24 (×2): 4 [IU] via SUBCUTANEOUS

## 2019-10-17 MED ORDER — GABAPENTIN 300 MG PO CAPS
300.0000 mg | ORAL_CAPSULE | Freq: Every day | ORAL | Status: DC
  Administered 2019-10-18 – 2019-11-07 (×21): 300 mg via ORAL
  Filled 2019-10-17 (×21): qty 1

## 2019-10-17 MED ORDER — INSULIN ASPART 100 UNIT/ML ~~LOC~~ SOLN
0.0000 [IU] | Freq: Every day | SUBCUTANEOUS | Status: DC
  Administered 2019-10-17: 4 [IU] via SUBCUTANEOUS
  Administered 2019-10-18: 2 [IU] via SUBCUTANEOUS
  Administered 2019-10-21: 5 [IU] via SUBCUTANEOUS
  Administered 2019-10-22 – 2019-10-23 (×2): 2 [IU] via SUBCUTANEOUS

## 2019-10-17 MED ORDER — TORSEMIDE 20 MG PO TABS
40.0000 mg | ORAL_TABLET | Freq: Two times a day (BID) | ORAL | Status: DC
  Administered 2019-10-18: 40 mg via ORAL
  Filled 2019-10-17: qty 2

## 2019-10-17 MED ORDER — HYDROCODONE-ACETAMINOPHEN 5-325 MG PO TABS
1.0000 | ORAL_TABLET | Freq: Once | ORAL | Status: AC
  Administered 2019-10-17: 1 via ORAL
  Filled 2019-10-17: qty 1

## 2019-10-17 MED ORDER — SODIUM CHLORIDE 0.9 % IV SOLN
2.0000 g | INTRAVENOUS | Status: DC
  Administered 2019-10-17 – 2019-10-21 (×5): 2 g via INTRAVENOUS
  Filled 2019-10-17 (×5): qty 20
  Filled 2019-10-17: qty 2
  Filled 2019-10-17: qty 20

## 2019-10-17 NOTE — ED Notes (Signed)
Stated "put this thing out, I am leaving" I/V Removed.

## 2019-10-17 NOTE — ED Notes (Signed)
Patient returned after leaving ED for short time

## 2019-10-17 NOTE — Progress Notes (Signed)
Pharmacy Antibiotic Note  Zachary Young is a 59 y.o. male admitted on 10/17/2019 with cellulitis.  Pharmacy has been consulted for Vancomycin dosing.      Temp (24hrs), Avg:98.5 F (36.9 C), Min:98.4 F (36.9 C), Max:98.5 F (36.9 C)  Recent Labs  Lab 10/17/19 1533  WBC 9.7  CREATININE 5.74*    CrCl cannot be calculated (Unknown ideal weight.).    Allergies  Allergen Reactions  . Bee Venom Anaphylaxis    Per pt, nearly died from bee sting as a child  . Propofol Other (See Comments)     Hallucinations  . Eggs Or Egg-Derived Products Nausea And Vomiting and Other (See Comments)    Stomach cramps  . Morphine Nausea And Vomiting  . Oxycodone Nausea And Vomiting    Antimicrobials this admission: 7/3 Ceftriaxone >>  7/3 Vancomycin >>   Dose adjustments this admission: N/a  Microbiology results: Pending   Plan:  - Vancomycin 2250mg  IV x 1 dose  - Will determine dosing interval based on Scr in the morning, Scr has been significantly increasing over the last few months and if more diuretics are given while inpatient will likely need q48hr dosing  - Monitor patients renal function and urine output  - De-escalate ABX when appropriate   Thank you for allowing pharmacy to be a part of this patient's care.  Duanne Limerick PharmD. BCPS 10/17/2019 9:11 PM

## 2019-10-17 NOTE — ED Provider Notes (Signed)
[facility] EMERGENCY DEPARTMENT Provider Note   CSN: [id number] Arrival date & time: 10/17/19  1521     History No chief complaint on file.   Davonne Jarnigan is a 59 y.o. male.  HPI 59 year old male presents with syncope and leg pain.  He states that his legs have been hurting on his porch and had been feeling lightheaded and then eventually passed out.  No injuries.  The pain in his legs is severe.  He has had leg pain like this many times before when his legs get swollen.  He has not eaten over the last couple days due to no appetite.  Some diarrhea but no vomiting.  States he has been drinking some fluids.  No fevers.   Past Medical History:  Diagnosis Date  . CHF (congestive heart failure) ([place])   . Diabetes mellitus without complication ([place])   . Peripheral edema   . Renal disorder    kidney disease    Patient Active Problem List   Diagnosis Date Noted  . Dyspnea   . Abdominal pain   . CHF (congestive heart failure) ([place]) 08/15/2019  . Acute hypoxemic respiratory failure ([place])   . Anasarca 06/17/2019  . Gastritis 05/26/2019  . GI bleed 12/14/2018  . Chronic kidney disease, stage 4 (severe) ([place]) 06/26/2018  . Acute on chronic combined systolic and diastolic heart failure ([place]) 06/26/2018  . Moderate nonproliferative retinopathy due to secondary diabetes ([place]) 04/30/2018  . HLD (hyperlipidemia) 02/28/2018  . History of MI (myocardial infarction) 05/22/2016  . History of osteomyelitis 05/22/2016  . Tobacco use disorder 03/27/2016  . HTN (hypertension) 03/26/2016  . Type 2 diabetes mellitus with circulatory disorder, with long-term current use of insulin ([place]) 03/26/2016  . Toe amputation status 03/17/2014    Past Surgical History:  Procedure Laterality Date  . TOE AMPUTATION Right    due to osteomyelitis       No family history on file.  Social History   Tobacco Use  . Smoking status: Current Every Day Smoker    Packs/day: 0.50     Types: Cigarettes  . Smokeless tobacco: Never Used  Substance Use Topics  . Alcohol use: Not Currently  . Drug use: Never    Home Medications Prior to Admission medications   Medication Sig Start Date End Date Taking? Authorizing Provider  albuterol (VENTOLIN HFA) 108 (90 Base) MCG/ACT inhaler Inhale 1 puff into the lungs every 4 (four) hours as needed for wheezing or shortness of breath. 08/20/19  Yes Gladys Damme, MD  amLODipine (NORVASC) 10 MG tablet Take 1 tablet (10 mg total) by mouth daily. 08/21/19  Yes Gladys Damme, MD  aspirin EC 81 MG EC tablet Take 1 tablet (81 mg total) by mouth daily. 08/21/19  Yes Gladys Damme, MD  ergocalciferol (VITAMIN D2) 1.25 MG (50000 UT) capsule Take 50,000 Units by mouth every Friday.  07/13/19  Yes [provider]  gabapentin (NEURONTIN) 300 MG capsule Take 300-600 mg by mouth See admin instructions. Take one capsule (300 mg) by mouth every morning and two capsules (600 mg) at night   Yes [provider]  insulin glargine (LANTUS) 100 UNIT/ML injection Inject 10 Units into the skin at bedtime.    Yes [provider]  insulin lispro (HUMALOG) 100 UNIT/ML injection Inject 4 Units into the skin 3 (three) times daily with meals.    Yes [provider]  oxyCODONE-acetaminophen (PERCOCET/ROXICET) 5-325 MG tablet Take by mouth every 6 (six) hours as needed  for severe pain.   Yes [provider]  torsemide (DEMADEX) 20 MG tablet Take 4 tablets (80 mg total) by mouth 2 (two) times daily. Patient taking differently: Take 20 mg by mouth daily.  08/20/19  Yes Gladys Damme, MD  Insulin Pen Needle (PEN NEEDLES 31GX5/16") 31G X 8 MM MISC 1 each by Other route daily. 05/16/19 05/15/20  [provider]  Insulin Syringe-Needle U-100 (GLOBAL INJECT EASE INSULIN SYR) 30G X 1/2" 0.5 ML MISC 1 each by Other route 4 (four) times daily as needed. Insulin 07/13/19   [provider]  metoprolol succinate  (TOPROL-XL) 100 MG 24 hr tablet Take 100 mg by mouth daily. Take with or immediately following a meal. Patient not taking: Reported on 10/17/2019    [provider]  potassium chloride 20 MEQ TBCR Take 40 mEq by mouth daily. Patient not taking: Reported on 10/17/2019 08/20/19 09/19/19  Simmons-Robinson, Riki Sheer, MD    Allergies    Bee venom, Propofol, Eggs or egg-derived products, Morphine, and Oxycodone  Review of Systems   Review of Systems  Constitutional: Negative for fever.  Respiratory: Negative for shortness of breath.   Cardiovascular: Positive for leg swelling. Negative for chest pain.  Gastrointestinal: Negative for abdominal pain and vomiting.  Musculoskeletal: Positive for myalgias.  Neurological: Positive for syncope and light-headedness. Negative for weakness.  All other systems reviewed and are negative.   Physical Exam Updated Vital Signs BP (!) 186/96   Pulse 87   Temp 98.4 F (36.9 C) (Oral)   Resp 16   SpO2 98%   Physical Exam Vitals and nursing note reviewed.  Constitutional:      General: He is not in acute distress.    Appearance: He is well-developed. He is obese. He is not ill-appearing or diaphoretic.  HENT:     Head: Normocephalic and atraumatic.     Right Ear: External ear normal.     Left Ear: External ear normal.     Nose: Nose normal.  Eyes:     General:        Right eye: No discharge.        Left eye: No discharge.  Cardiovascular:     Rate and Rhythm: Normal rate and regular rhythm.     Heart sounds: Murmur heard.   Pulmonary:     Effort: Pulmonary effort is normal.     Breath sounds: Normal breath sounds. No wheezing, rhonchi or rales.  Chest:     Chest wall: No tenderness.  Abdominal:     General: There is no distension.     Palpations: Abdomen is soft.     Tenderness: There is no abdominal tenderness.  Musculoskeletal:     Cervical back: Neck supple.     Right lower leg: Edema present.     Left lower leg: Edema present.      Comments: Diffusely swollen bilateral legs, appears to have chronic lymphedema  Skin:    General: Skin is warm and dry.  Neurological:     Mental Status: He is alert.  Psychiatric:        Mood and Affect: Mood is not anxious.     ED Results / Procedures / Treatments   Labs (all labs ordered are listed, but only abnormal results are displayed) Labs Reviewed  BASIC METABOLIC PANEL - Abnormal; Notable for the following components:      Result Value   CO2 19 (*)    Glucose, Bld 264 (*)    BUN  97 (*)    Creatinine, Ser 5.74 (*)    Calcium 7.3 (*)    GFR calc non Af Amer 10 (*)    GFR calc Af Amer 12 (*)    All other components within normal limits  CBC - Abnormal; Notable for the following components:   RBC 3.79 (*)    Hemoglobin 9.7 (*)    HCT 31.3 (*)    MCH 25.6 (*)    Platelets 442 (*)    All other components within normal limits  CBG MONITORING, ED - Abnormal; Notable for the following components:   Glucose-Capillary 258 (*)    All other components within normal limits  SARS CORONAVIRUS 2 BY RT PCR (HOSPITAL ORDER, [facility])  URINALYSIS, ROUTINE W REFLEX MICROSCOPIC    EKG EKG Interpretation  Date/Time:  Saturday October 17 2019 15:27:44 EDT Ventricular Rate:  79 PR Interval:  188 QRS Duration: 102 QT Interval:  414 QTC Calculation: 474 R Axis:   -36 Text Interpretation: Normal sinus rhythm Left axis deviation Moderate voltage criteria for LVH, may be normal variant ( R in aVL , Cornell product ) Abnormal ECG similar to May 2021 Confirmed by Sherwood Gambler [phone]) on 10/17/2019 5:31:30 PM   Radiology No results found.  Procedures Procedures (including critical care time)  Medications Ordered in ED Medications  sodium chloride flush (NS) 0.9 % injection 3 mL (has no administration in time range)  sodium chloride 0.9 % bolus 500 mL (has no administration in time range)  HYDROcodone-acetaminophen (NORCO/VICODIN) 5-325 MG per tablet  1 tablet (1 tablet Oral Given 10/17/19 1903)    ED Course  I have reviewed the triage vital signs and the nursing notes.  Pertinent labs & imaging results that were available during my care of the patient were reviewed by me and considered in my medical decision making (see chart for details).    MDM Rules/Calculators/A&P                          Mr. Pinkham is complicated.  Chart review has been completed including outside records from his recent admission to [facility].  He is not ill-appearing but does have his chronic lymphedema.  With no shortness of breath, I think CHF is less likely and I think he is probably a little dry, especially with the BUN of 97 and report of decreased p.o. intake.  I think given his complex history and acute on chronic kidney injury, I think he should be admitted for some fluids and monitoring.  Dr. Daryll Drown to admit. Final Clinical Impression(s) / ED Diagnoses Final diagnoses:  Syncope, unspecified syncope type  Acute kidney injury superimposed on chronic kidney disease [facility])    Rx / DC Orders ED Discharge Orders    None       Sherwood Gambler, MD 10/17/19 [place]

## 2019-10-17 NOTE — H&P (Addendum)
History and Physical    Zachary Young NKN:[id number] DOB: 04-25-1960 DOA: 10/17/2019  PCP: Dr. Phillis Haggis Patient coming from: Home  Chief Complaint: Passed out.   HPI: Zachary Young is a 59 y.o. male with medical history significant of kidney disease, diabetes type 2, chronic combined CHF, HTN, diabetic neuropathy with pain who presented after passing out.  Zachary Young notes that today, he went out on to his front porch to get some fresh air.  Once out there, he became dizzy and had to lean against the house, and then he passed out.  He slid down the side of the house and did not hit his head.  He noted no chest pain, no fever, no chills, no change in urination.  He does note that he has had a decreased appetite for the last 3-4 days, eating only about half of what he normally does.  He has passed out before in the past when his sugar drops, and he thinks this may have happened today.  He denies chest pain.  He has SOB with ambulating at times, but nothing today.  He report no change in urination and feels like he is urinating the normal amount.  He reports taking torsemide 80mg  in the morning and evening (4 tablets) and not missing doses.  He was having issues with pain in bed, and as I looked at his legs, I noted that his right leg was swollen, warm and mildly erythematous compared to the left.  When asked, he noted a new wound on the plantar surface, at the 4th and 5th digit which he noticed first about 2 weeks ago.  He is hopeful that he does not have osteomyelitis as he has already lost all 5 toes on both feet.  He reports that percocet and gabapentin are the only thing that work for his pain.   ED Course: In the ED, he was noted to have worsening renal function, concern for volume contraction given the elevated BUN (BUN/Cr ratio is 16) and a 500cc bolus was given.  He has very mildly combined CHF so I think this is reasonable.  He was further noted to have a WBC of 9.7 and an H/H of 9/31  which is around his baseline.  EKG showed NSR with possible LVH.   Review of Systems: As per HPI otherwise all other systems reviewed and are negative.   Past Medical History:  Diagnosis Date  . CHF (congestive heart failure) ([place])   . Diabetes mellitus without complication ([place])   . Peripheral edema   . Renal disorder    kidney disease    Past Surgical History:  Procedure Laterality Date  . TOE AMPUTATION Bilateral    due to osteomyelitis, all 5 each foot    Social History  reports that he has been smoking cigarettes. He has been smoking about 0.50 packs per day. He has never used smokeless tobacco. He reports previous alcohol use. He reports that he does not use drugs.  Allergies  Allergen Reactions  . Bee Venom Anaphylaxis    Per pt, nearly died from bee sting as a child  . Propofol Other (See Comments)     Hallucinations  . Eggs Or Egg-Derived Products Nausea And Vomiting and Other (See Comments)    Stomach cramps  . Morphine Nausea And Vomiting  . Oxycodone Nausea And Vomiting    Family History  Problem Relation Age of Onset  . Cancer Mother      Prior to Admission  medications   Medication Sig Start Date End Date Taking? Authorizing Provider  albuterol (VENTOLIN HFA) 108 (90 Base) MCG/ACT inhaler Inhale 1 puff into the lungs every 4 (four) hours as needed for wheezing or shortness of breath. 08/20/19  Yes Gladys Damme, MD  amLODipine (NORVASC) 10 MG tablet Take 1 tablet (10 mg total) by mouth daily. 08/21/19  Yes Gladys Damme, MD  aspirin EC 81 MG EC tablet Take 1 tablet (81 mg total) by mouth daily. 08/21/19  Yes Gladys Damme, MD  ergocalciferol (VITAMIN D2) 1.25 MG (50000 UT) capsule Take 50,000 Units by mouth every Friday.  07/13/19  Yes [provider]  gabapentin (NEURONTIN) 300 MG capsule Take 300-600 mg by mouth See admin instructions. Take one capsule (300 mg) by mouth every morning and two capsules (600 mg) at night   Yes [provider]  insulin glargine (LANTUS) 100 UNIT/ML injection Inject 10 Units into the skin at bedtime.    Yes [provider]  insulin lispro (HUMALOG) 100 UNIT/ML injection Inject 4 Units into the skin 3 (three) times daily with meals.    Yes [provider]  oxyCODONE-acetaminophen (PERCOCET/ROXICET) 5-325 MG tablet Take by mouth every 6 (six) hours as needed for severe pain.   Yes [provider]  torsemide (DEMADEX) 20 MG tablet Take 4 tablets (80 mg total) by mouth 2 (two) times daily. Patient taking differently: Take 20 mg by mouth daily.  08/20/19  Yes Gladys Damme, MD  Insulin Pen Needle (PEN NEEDLES 31GX5/16") 31G X 8 MM MISC 1 each by Other route daily. 05/16/19 05/15/20  [provider]  Insulin Syringe-Needle U-100 (GLOBAL INJECT EASE INSULIN SYR) 30G X 1/2" 0.5 ML MISC 1 each by Other route 4 (four) times daily as needed. Insulin 07/13/19   [provider]  metoprolol succinate (TOPROL-XL) 100 MG 24 hr tablet Take 100 mg by mouth daily. Take with or immediately following a meal. Patient not taking: Reported on 10/17/2019    [provider]  potassium chloride 20 MEQ TBCR Take 40 mEq by mouth daily. Patient not taking: Reported on 10/17/2019 08/20/19 09/19/19  Eulis Foster, MD    Physical Exam: Vitals:   10/17/19 1521 10/17/19 1712 10/17/19 1840 10/17/19 1915  BP: (!) 162/86 (!) 141/76 (!) 145/129 (!) 186/96  Pulse: 83 78 82 87  Resp: 16     Temp: 98.5 F (36.9 C) 98.4 F (36.9 C)    TempSrc: Oral Oral    SpO2: 97% 100%  98%    Constitutional: Lying in bed, uncomfortable due to leg pain.  Eyes:  lids and conjunctivae normal ENMT: Mucous membranes are mildly dry.  Neck: normal, supple Respiratory: CTAB, no wheezing or rales Cardiovascular: RR, NR, no murmur.  Unable to palpate pulses in feet due to chronic edema, he has right > left edema to bilateral LE with chronic skin changes.   Abdomen: Obese, NT, ND,  +BS Musculoskeletal: no clubbing / cyanosis. He is surgically absent of all 5 toes on bilateral feet.  Skin: He has a 4X4 cm circular eschar covered wound on the plantar surface of the right foot over the 4th and 5th metatarsals.  Non tender to palpation, no pus draining.  He has erythema up to the knee on that side along with swelling and warmth.  There is a small open area on the medial calf.   Neurologic: Moving all extremities.  No focal deficit noted on exam. Speech is fluent.  Psychiatric: Normal judgment and  insight. Alert and oriented x 3. Normal mood.   Labs on Admission: I have personally reviewed following labs and imaging studies  CBC: Recent Labs  Lab 10/17/19 1533  WBC 9.7  HGB 9.7*  HCT 31.3*  MCV 82.6  PLT 442*    Basic Metabolic Panel: Recent Labs  Lab 10/17/19 1533  NA 136  K 3.5  CL 103  CO2 19*  GLUCOSE 264*  BUN 97*  CREATININE 5.74*  CALCIUM 7.3*    GFR: CrCl cannot be calculated (Unknown ideal weight.).  Liver Function Tests: No results for input(s): AST, ALT, ALKPHOS, BILITOT, PROT, ALBUMIN in the last 168 hours.  Urine analysis:    Component Value Date/Time   COLORURINE YELLOW 08/15/2019 1538   APPEARANCEUR CLEAR 08/15/2019 1538   LABSPEC 1.010 08/15/2019 1538   PHURINE 5.0 08/15/2019 1538   GLUCOSEU NEGATIVE 08/15/2019 1538   HGBUR SMALL (A) 08/15/2019 1538   BILIRUBINUR NEGATIVE 08/15/2019 1538   KETONESUR NEGATIVE 08/15/2019 1538   PROTEINUR >=300 (A) 08/15/2019 1538   NITRITE NEGATIVE 08/15/2019 1538   LEUKOCYTESUR NEGATIVE 08/15/2019 1538    Radiological Exams on Admission: No results found.  EKG: Independently reviewed. NSR, possible LVH  Assessment/Plan Type 2 diabetes mellitus with circulatory disorder, with long-term current use of insulin (HCC) Wound of right foot Hyperglycemia - Xray of the foot - Start Abx with vancomycin, flagyl and rocephin - If OM, will need to consult with ID - BC X 2 - Continue lantus 10  units qhs - SSI, obese, resistant - Last A1C in May was 8.5 - Consult surgery if osteomyelitis - ABI if able to tolerate - Given breathing issues, will also get venous ultrasound - Wound care  Syncope - I think this is likely multifactorial including overdiuresis, acute on chronic kidney disease and acute infection - Will monitor on telemetry - TTE from May was very difficult given habitus, consider repeating if patient not improving or concern for other cause of syncope.  - Patient apparently refusing telemetry - have asked nursing to ask him to give it a try.  If he continues to refuse, will cancel order.      Chronic combined CHF (congestive heart failure) - Currently appears to be controlled, he is, if anything, volume down given kidneys - 500cc bolus - Cut torsemide in half for today, 40mg  BID - He reports not being on beta blocker, so will hold for now. - Telemetry  - Daily weight - Strict I/O - He reports having too much pain with UNNA boots     Acute on Chronic kidney disease, stage 4 (severe) Normocytic anemia - Likely related to decreased PO intake and infection - 500cc bolus in the ED - Trend renal function - Encourage PO intake    HLD (hyperlipidemia) - Outpatient follow up, not on a statin based on review    HTN (hypertension) - Continue amlodipine, torsemide as noted above    Tobacco use disorder - Counseling prior to discharge     DVT prophylaxis: Low dose lovenox  Code Status:   Full  Disposition Plan:   Patient is from:  Home  Anticipated DC to:  Possible need for rehab depending on surgical needs  Anticipated DC date:  10/20/19  Anticipated DC barriers: Infection control  Consults called:  None tonight, may need ID or surgery Admission status:  IP, telemetry   Severity of Illness: The appropriate patient status for this patient is INPATIENT. Inpatient status is judged to be reasonable and necessary  in order to provide the required intensity of  service to ensure the patient's safety. The patient's presenting symptoms, physical exam findings, and initial radiographic and laboratory data in the context of their chronic comorbidities is felt to place them at high risk for further clinical deterioration. Furthermore, it is not anticipated that the patient will be medically stable for discharge from the hospital within 2 midnights of admission. The following factors support the patient status of inpatient.   " The patient's presenting symptoms include erythema, syncope. " The worrisome physical exam findings include wound on the foot. " The initial radiographic and laboratory data are worrisome because of aki on ckd. " The chronic co-morbidities include diabetes.   * I certify that at the point of admission it is my clinical judgment that the patient will require inpatient hospital care spanning beyond 2 midnights from the point of admission due to high intensity of service, high risk for further deterioration and high frequency of surveillance required.Gilles Chiquito MD [facility]  How to contact the [facility] Attending or Consulting provider [place] or covering provider during after hours [place], for this patient?   1. Check the care team in [facility] and look for a) attending/consulting TR[facility] provider listed and b) the [facility] team listed 2. Log into [email or web address] and use [place]'s universal password to access. If you do not have the password, please contact the hospital operator. 3. Locate the [facility] provider you are looking for under [facility] and page to a number that you can be directly reached. 4. If you still have difficulty reaching the provider, please page the [facility] (Director on Call) for the Hospitalists listed on amion for assistance.  10/17/2019, 8:52 PM

## 2019-10-17 NOTE — ED Triage Notes (Signed)
Patient arrived by [facility] after near syncopal event today. Patient states his legs are weak and denies pain but thinks that's why he had near syncope. Alert and oriented

## 2019-10-18 ENCOUNTER — Inpatient Hospital Stay (HOSPITAL_COMMUNITY): Payer: Medicaid Other

## 2019-10-18 DIAGNOSIS — M79609 Pain in unspecified limb: Secondary | ICD-10-CM

## 2019-10-18 DIAGNOSIS — M7989 Other specified soft tissue disorders: Secondary | ICD-10-CM

## 2019-10-18 DIAGNOSIS — N189 Chronic kidney disease, unspecified: Secondary | ICD-10-CM

## 2019-10-18 DIAGNOSIS — S91301A Unspecified open wound, right foot, initial encounter: Secondary | ICD-10-CM | POA: Diagnosis not present

## 2019-10-18 DIAGNOSIS — M79604 Pain in right leg: Secondary | ICD-10-CM

## 2019-10-18 DIAGNOSIS — L0390 Cellulitis, unspecified: Secondary | ICD-10-CM

## 2019-10-18 DIAGNOSIS — F17200 Nicotine dependence, unspecified, uncomplicated: Secondary | ICD-10-CM | POA: Diagnosis not present

## 2019-10-18 DIAGNOSIS — N179 Acute kidney failure, unspecified: Secondary | ICD-10-CM

## 2019-10-18 LAB — PREALBUMIN: Prealbumin: 13.2 mg/dL — ABNORMAL LOW (ref 18–38)

## 2019-10-18 LAB — CBC
HCT: 27.9 % — ABNORMAL LOW (ref 39.0–52.0)
Hemoglobin: 8.5 g/dL — ABNORMAL LOW (ref 13.0–17.0)
MCH: 25.9 pg — ABNORMAL LOW (ref 26.0–34.0)
MCHC: 30.5 g/dL (ref 30.0–36.0)
MCV: 85.1 fL (ref 80.0–100.0)
Platelets: 392 10*3/uL (ref 150–400)
RBC: 3.28 MIL/uL — ABNORMAL LOW (ref 4.22–5.81)
RDW: 14.7 % (ref 11.5–15.5)
WBC: 6.4 10*3/uL (ref 4.0–10.5)
nRBC: 0 % (ref 0.0–0.2)

## 2019-10-18 LAB — COMPREHENSIVE METABOLIC PANEL
ALT: 10 U/L (ref 0–44)
AST: 11 U/L — ABNORMAL LOW (ref 15–41)
Albumin: 2.3 g/dL — ABNORMAL LOW (ref 3.5–5.0)
Alkaline Phosphatase: 84 U/L (ref 38–126)
Anion gap: 13 (ref 5–15)
BUN: 88 mg/dL — ABNORMAL HIGH (ref 6–20)
CO2: 20 mmol/L — ABNORMAL LOW (ref 22–32)
Calcium: 7.3 mg/dL — ABNORMAL LOW (ref 8.9–10.3)
Chloride: 106 mmol/L (ref 98–111)
Creatinine, Ser: 5.32 mg/dL — ABNORMAL HIGH (ref 0.61–1.24)
GFR calc Af Amer: 13 mL/min — ABNORMAL LOW (ref >60)
GFR calc non Af Amer: 11 mL/min — ABNORMAL LOW (ref >60)
Glucose, Bld: 218 mg/dL — ABNORMAL HIGH (ref 70–99)
Potassium: 3.8 mmol/L (ref 3.5–5.1)
Sodium: 139 mmol/L (ref 135–145)
Total Bilirubin: 0.5 mg/dL (ref 0.3–1.2)
Total Protein: 8.1 g/dL (ref 6.5–8.1)

## 2019-10-18 LAB — RAPID URINE DRUG SCREEN, HOSP PERFORMED
Amphetamines: NOT DETECTED
Barbiturates: NOT DETECTED
Benzodiazepines: NOT DETECTED
Cocaine: POSITIVE — AB
Opiates: NOT DETECTED
Tetrahydrocannabinol: NOT DETECTED

## 2019-10-18 LAB — GLUCOSE, CAPILLARY
Glucose-Capillary: 208 mg/dL — ABNORMAL HIGH (ref 70–99)
Glucose-Capillary: 167 mg/dL — ABNORMAL HIGH (ref 70–99)
Glucose-Capillary: 169 mg/dL — ABNORMAL HIGH (ref 70–99)
Glucose-Capillary: 228 mg/dL — ABNORMAL HIGH (ref 70–99)

## 2019-10-18 LAB — SEDIMENTATION RATE: Sed Rate: >140 mm/hr — ABNORMAL HIGH (ref 0–16)

## 2019-10-18 LAB — C-REACTIVE PROTEIN: CRP: 13 mg/dL — ABNORMAL HIGH (ref <1.0)

## 2019-10-18 LAB — HIV ANTIBODY (ROUTINE TESTING W REFLEX): HIV Screen 4th Generation wRfx: NONREACTIVE

## 2019-10-18 MED ORDER — COLLAGENASE 250 UNIT/GM EX OINT
TOPICAL_OINTMENT | Freq: Every day | CUTANEOUS | Status: DC
  Filled 2019-10-18 (×2): qty 30

## 2019-10-18 MED ORDER — HYDROXYZINE HCL 25 MG PO TABS
25.0000 mg | ORAL_TABLET | Freq: Four times a day (QID) | ORAL | Status: DC | PRN
  Administered 2019-10-18 – 2019-12-04 (×12): 25 mg via ORAL
  Filled 2019-10-18 (×16): qty 1

## 2019-10-18 MED ORDER — DIPHENHYDRAMINE HCL 50 MG/ML IJ SOLN
25.0000 mg | Freq: Once | INTRAMUSCULAR | Status: AC
  Administered 2019-10-18: 25 mg via INTRAVENOUS
  Filled 2019-10-18: qty 1

## 2019-10-18 MED ORDER — TORSEMIDE 20 MG PO TABS
40.0000 mg | ORAL_TABLET | Freq: Two times a day (BID) | ORAL | Status: DC
  Administered 2019-10-19 – 2019-10-27 (×17): 40 mg via ORAL
  Filled 2019-10-18 (×17): qty 2

## 2019-10-18 NOTE — Progress Notes (Signed)
Progress Note    Zachary Young  GYK:[id number] DOB: 11/26/1960  DOA: 10/17/2019 PCP: System, Pcp Not In    Brief Narrative:     Medical records reviewed and are as summarized below:  Zachary Young is an 59 y.o. male with medical history significant of kidney disease, diabetes type 2, chronic combined CHF, HTN, diabetic neuropathy with pain who presented after passing out.  Mr. Hollomon notes that today, he went out on to his front porch to get some fresh air.  Once out there, he became dizzy and had to lean against the house, and then he passed out.  He slid down the side of the house and did not hit his head.  He noted no chest pain, no fever, no chills, no change in urination.  He does note that he has had a decreased appetite for the last 3-4 days, eating only about half of what he normally does.  He has passed out before in the past when his sugar drops, and he thinks this may have happened today.  He denies chest pain.  He has SOB with ambulating at times, but nothing today.  He report no change in urination and feels like he is urinating the normal amount.  He reports taking torsemide 47m in the morning and evening (4 tablets) and not missing doses.  He was having issues with pain in bed, and as I looked at his legs, I noted that his right leg was swollen, warm and mildly erythematous compared to the left.  When asked, he noted a new wound on the plantar surface, at the 4th and 5th digit which he noticed first about 2 weeks ago.   Assessment/Plan:   Active Problems:   CHF (congestive heart failure) (HCC)   Chronic kidney disease, stage 4 (severe) (HCC)   HLD (hyperlipidemia)   HTN (hypertension)   Tobacco use disorder   Type 2 diabetes mellitus with circulatory disorder, with long-term current use of insulin (HCC)   Wound of right foot   Type 2 diabetes mellitus with circulatory disorder, with long-term current use of insulin (HCC) Wound of right foot Hyperglycemia - Xray of  the foot suspicious for osteomyelitis and this is supported by ESR/CRP being elevated - Start Abx with vancomycin, flagyl and rocephin - BC X 2 - Continue to work towards blood sugar control - Last A1C in May was 8.5 - ABI done but unreliable - patient to decide if he wants to continue work up here or [facility] -likely multifactorial including overdiuresis, acute on chronic kidney disease and acute infection - refused telemetry - TTE from May was very difficult given habitus     Chronic combined CHF (congestive heart failure) - Currently appears to be controlled, he is, if anything, volume down given kidneys - 500cc bolus - Cut torsemide in half for today, 479mBID - He reports not being on beta blocker, so will hold for now. - Telemetry  - Daily weight - Strict I/O    Cocaine abuse -encouraged cessation  Acute on Chronic kidney disease, stage 4 (severe) Normocytic anemia - Likely related to decreased PO intake and infection - 500cc bolus in the ED - Trend renal function     HLD (hyperlipidemia) - Outpatient follow up, not on a statin based on review    HTN (hypertension) - Continue amlodipine, torsemide as noted above    Tobacco use disorder - Counseling prior to discharge  obesity Body mass index is  37.2 kg/m.   Family Communication/Anticipated D/C date and plan/Code Status   DVT prophylaxis:  Code Status: Full Code.  Disposition Plan: Status is: Inpatient  Remains inpatient appropriate because:IV treatments appropriate due to intensity of illness or inability to take PO   Dispo: The patient is from: Home              Anticipated d/c is to: Home              Anticipated d/c date is: > 3 days              Patient currently is not medically stable to d/c.         Medical Consultants:    None.     Subjective:   Says he wants to follow up at [facility] where his doctors are  Objective:    Vitals:   10/17/19 2221 10/18/19 0456 10/18/19  0954 10/18/19 1242  BP: (!) 151/76 136/73 139/87 120/60  Pulse: 75 67 67 69  Resp: 18 16 17 17   Temp: 98 F (36.7 C) (!) 97.4 F (36.3 C)  97.6 F (36.4 C)  TempSrc: Axillary Axillary  Oral  SpO2: 98% 100%  99%  Weight: 135.4 kg 135 kg    Height: 6' 3"  (1.905 m)       Intake/Output Summary (Last 24 hours) at 10/18/2019 1525 Last data filed at 10/18/2019 1310 Gross per 24 hour  Intake 928.39 ml  Output 700 ml  Net 228.39 ml   Filed Weights   10/17/19 2221 10/18/19 0456  Weight: 135.4 kg 135 kg    Exam:  General: Appearance:    Obese male in no acute distress     Lungs:     Clear to auscultation bilaterally, respirations unlabored  Heart:    Normal heart rate. Normal rhythm. No murmurs, rubs, or gallops.   MS:  refused exam  Neurologic:   Awake, alert, unpleasant and uncooperative      Data Reviewed:   I have personally reviewed following labs and imaging studies:  Labs: Labs show the following:   Basic Metabolic Panel: Recent Labs  Lab 10/17/19 1533 10/17/19 2312 10/18/19 0815  NA 136  --  139  K 3.5  --  3.8  CL 103  --  106  CO2 19*  --  20*  GLUCOSE 264*  --  218*  BUN 97*  --  88*  CREATININE 5.74* 5.45* 5.32*  CALCIUM 7.3*  --  7.3*   GFR Estimated Creatinine Clearance: 22.7 mL/min (A) (by C-G formula based on SCr of 5.32 mg/dL (H)). Liver Function Tests: Recent Labs  Lab 10/18/19 0815  AST 11*  ALT 10  ALKPHOS 84  BILITOT 0.5  PROT 8.1  ALBUMIN 2.3*   No results for input(s): LIPASE, AMYLASE in the last 168 hours. No results for input(s): AMMONIA in the last 168 hours. Coagulation profile No results for input(s): INR, PROTIME in the last 168 hours.  CBC: Recent Labs  Lab 10/17/19 1533 10/18/19 0815  WBC 9.7 6.4  HGB 9.7* 8.5*  HCT 31.3* 27.9*  MCV 82.6 85.1  PLT 442* 392   Cardiac Enzymes: No results for input(s): CKTOTAL, CKMB, CKMBINDEX, TROPONINI in the last 168 hours. BNP (last 3 results) No results for input(s): PROBNP in  the last 8760 hours. CBG: Recent Labs  Lab 10/17/19 1529 10/17/19 2235 10/18/19 0605 10/18/19 1058  GLUCAP 258* 325* 208* 167*   D-Dimer: No results for input(s): DDIMER in the  last 72 hours. Hgb A1c: Recent Labs    10/17/19 2312  HGBA1C 8.8*   Lipid Profile: No results for input(s): CHOL, HDL, LDLCALC, TRIG, CHOLHDL, LDLDIRECT in the last 72 hours. Thyroid function studies: No results for input(s): TSH, T4TOTAL, T3FREE, THYROIDAB in the last 72 hours.  Invalid input(s): FREET3 Anemia work up: No results for input(s): VITAMINB12, FOLATE, FERRITIN, TIBC, IRON, RETICCTPCT in the last 72 hours. Sepsis Labs: Recent Labs  Lab 10/17/19 1533 10/18/19 0815  WBC 9.7 6.4    Microbiology Recent Results (from the past 240 hour(s))  SARS Coronavirus 2 by RT PCR (hospital order, performed in [facility] hospital lab) Nasopharyngeal Nasopharyngeal Swab     Status: None   Collection Time: 10/17/19  6:49 PM   Specimen: Nasopharyngeal Swab  Result Value Ref Range Status   SARS Coronavirus 2 NEGATIVE NEGATIVE Final    Comment: (NOTE) SARS-CoV-2 target nucleic acids are NOT DETECTED.  The SARS-CoV-2 RNA is generally detectable in upper and lower respiratory specimens during the acute phase of infection. The lowest concentration of SARS-CoV-2 viral copies this assay can detect is 250 copies / mL. A negative result does not preclude SARS-CoV-2 infection and should not be used as the sole basis for treatment or other patient management decisions.  A negative result may occur with improper specimen collection / handling, submission of specimen other than nasopharyngeal swab, presence of viral mutation(s) within the areas targeted by this assay, and inadequate number of viral copies (<250 copies / mL). A negative result must be combined with clinical observations, patient history, and epidemiological information.  Fact Sheet for Patients:    StrictlyIdeas.no  Fact Sheet for Healthcare Providers: BankingDealers.co.za  This test is not yet approved or  cleared by the [place] FDA and has been authorized for detection and/or diagnosis of SARS-CoV-2 by FDA under an Emergency Use Authorization (EUA).  This EUA will remain in effect (meaning this test can be used) for the duration of the COVID-19 declaration under Section 564(b)(1) of the Act, 21 U.S.C. section 360bbb-3(b)(1), unless the authorization is terminated or revoked sooner.  Performed at [facility], [place] [address] [place], [place] [address]     Procedures and diagnostic studies:  AP / lateral X-ray Right foot  Result Date: 10/17/2019 CLINICAL DATA:  Right foot wound with blackening along the plantar aspect around the 3rd to 5th metatarsals EXAM: RIGHT FOOT - 2 VIEW COMPARISON:  None. FINDINGS: Remote postsurgical changes from prior transmetatarsal amputation of the second through fifth rays as well as resection of the first ray at the level of the tarsometatarsal joint. Some heterotopic ossification is noted along the resection margins. There is extensive soft tissue swelling about the forefoot including some crescentic linear lucencies of gas along the plantar surface and some focal irregularity which could reflect site of ulceration. Marked edematous changes are seen subjacent to the site of possible ulceration as well as subcortical lucency involving the adjacent metatarsal resection margin, likely about the fourth or fifth metatarsal. Additional retracted soft tissue/cicatricial change noted towards the more dorsal aspect of the forefoot as well. IMPRESSION: Soft tissue swelling and ulceration at the plantar surface of the forefoot with findings concerning for osteomyelitis of the adjacent fourth and fifth metatarsal resection margins. Findings suspicious for osteomyelitis. Electronically Signed   By: Lovena Le  M.D.   On: 10/17/2019 21:55   VAS [place] ABI WITH/WO TBI  Result Date: 10/18/2019 LOWER EXTREMITY DOPPLER STUDY Indications: Ulceration, and cellulitis. High  Risk Factors: Hypertension, Diabetes. Other Factors: Amputation of all 5 toes on bilateral feet, secondary to                osteomyelitis.  Limitations: Today's exam was limited due to venous interference. Comparison Study: No prior study on file Performing Technologist: Sharion Dove RVS  Examination Guidelines: A complete evaluation includes at minimum, Doppler waveform signals and systolic blood pressure reading at the level of bilateral brachial, anterior tibial, and posterior tibial arteries, when vessel segments are accessible. Bilateral testing is considered an integral part of a complete examination. Photoelectric Plethysmograph (PPG) waveforms and toe systolic pressure readings are included as required and additional duplex testing as needed. Limited examinations for reoccurring indications may be performed as noted.  ABI Findings: +---------+------------------+-----+-------------------+----------+ Right    Rt Pressure (mmHg)IndexWaveform           Comment    +---------+------------------+-----+-------------------+----------+ Brachial 151                    triphasic                     +---------+------------------+-----+-------------------+----------+ PTA      121               0.80 dampened monophasic           +---------+------------------+-----+-------------------+----------+ DP       104               0.69 dampened monophasic           +---------+------------------+-----+-------------------+----------+ Great Toe                                          amputation +---------+------------------+-----+-------------------+----------+ +---------+------------------+-----+-------------------+----------+ Left     Lt Pressure (mmHg)IndexWaveform           Comment     +---------+------------------+-----+-------------------+----------+ Brachial                        triphasic          IV         +---------+------------------+-----+-------------------+----------+ PTA      165               1.09 biphasic                      +---------+------------------+-----+-------------------+----------+ DP       44                0.29 dampened monophasic           +---------+------------------+-----+-------------------+----------+ Great Toe                                          amputation +---------+------------------+-----+-------------------+----------+ +-------+-----------+-----------+------------+------------+ ABI/TBIToday's ABIToday's TBIPrevious ABIPrevious TBI +-------+-----------+-----------+------------+------------+ Right  0.8                                            +-------+-----------+-----------+------------+------------+ Left   1.09                                           +-------+-----------+-----------+------------+------------+  Summary: Right: Resting right ankle-brachial index indicates mild right lower extremity arterial disease. ABIs are unreliable. Left: Resting left ankle-brachial index is within normal range. No evidence of significant left lower extremity arterial disease. ABIs are unreliable.  *See table(s) above for measurements and observations.  Electronically signed by Monica Martinez MD on 10/18/2019 at 1:13:16 PM.    Final    VAS [place] LOWER EXTREMITY VENOUS (DVT)  Result Date: 10/18/2019  Lower Venous DVTStudy Indications: Swelling, Pain, and cellulitis.  Comparison Study: Prior study from 08/16/19 is available for comparison Performing Technologist: Sharion Dove RVS  Examination Guidelines: A complete evaluation includes B-mode imaging, spectral Doppler, color Doppler, and power Doppler as needed of all accessible portions of each vessel. Bilateral testing is considered an integral part of a complete  examination. Limited examinations for reoccurring indications may be performed as noted. The reflux portion of the exam is performed with the patient in reverse Trendelenburg.  +---------+---------------+---------+-----------+----------+-------------------+ RIGHT    CompressibilityPhasicitySpontaneityPropertiesThrombus Aging      +---------+---------------+---------+-----------+----------+-------------------+ CFV      Full           Yes      Yes                                      +---------+---------------+---------+-----------+----------+-------------------+ SFJ      Full                                                             +---------+---------------+---------+-----------+----------+-------------------+ FV Prox  Full                                                             +---------+---------------+---------+-----------+----------+-------------------+ FV Mid   Full                                                             +---------+---------------+---------+-----------+----------+-------------------+ FV Distal                                             patent by color and                                                       Doppler             +---------+---------------+---------+-----------+----------+-------------------+ POP      Full           Yes      Yes                                      +---------+---------------+---------+-----------+----------+-------------------+  PTV      Full                                                             +---------+---------------+---------+-----------+----------+-------------------+ PERO                                                  Not visualized      +---------+---------------+---------+-----------+----------+-------------------+   +----+---------------+---------+-----------+----------+--------------+ LEFTCompressibilityPhasicitySpontaneityPropertiesThrombus Aging  +----+---------------+---------+-----------+----------+--------------+ CFV Full           Yes      Yes                                 +----+---------------+---------+-----------+----------+--------------+     Summary: RIGHT: - Findings appear essentially unchanged compared to previous examination. - There is no evidence of deep vein thrombosis in the lower extremity. However, portions of this examination were limited- see technologist comments above.  - Ultrasound characteristics of enlarged lymph nodes are noted in the groin.  LEFT: - No evidence of common femoral vein obstruction. - Ultrasound characteristics of enlarged lymph nodes noted in the groin.  *See table(s) above for measurements and observations. Electronically signed by Monica Martinez MD on 10/18/2019 at 1:16:53 PM.    Final     Medications:   . amLODipine  10 mg Oral Daily  . aspirin EC  81 mg Oral Daily  . enoxaparin (LOVENOX) injection  30 mg Subcutaneous Q24H  . gabapentin  300 mg Oral Daily   And  . gabapentin  600 mg Oral QHS  . insulin aspart  0-20 Units Subcutaneous TID WC  . insulin aspart  0-5 Units Subcutaneous QHS  . insulin glargine  10 Units Subcutaneous QHS  . sodium chloride flush  3 mL Intravenous Q12H  . torsemide  40 mg Oral BID  . vancomycin variable dose per unstable renal function (pharmacist dosing)   Does not apply See admin instructions   Continuous Infusions: . cefTRIAXone (ROCEPHIN)  IV 2 g (10/17/19 2333)  . metronidazole 500 mg (10/18/19 1304)     LOS: 1 day   Geradine Girt  [facility]   How to contact the [facility] Attending or Consulting provider [place] or covering provider during after hours [place], for this patient?  1. Check the care team in [facility] and look for a) attending/consulting [facility] provider listed and b) the [facility] team listed 2. Log into [email or web address] and use [place]'s universal password to access. If you do not have the password, please contact the hospital  operator. 3. Locate the [facility] provider you are looking for under [facility] and page to a number that you can be directly reached. 4. If you still have difficulty reaching the provider, please page the [facility] (Director on Call) for the Hospitalists listed on amion for assistance.  10/18/2019, 3:25 PM

## 2019-10-18 NOTE — Progress Notes (Signed)
VASCULAR LAB    Right lower extremity venous duplex completed.    Preliminary report:  See CV proc for preliminary results.  Yenny Kosa, RVT 10/18/2019, 9:20 AM

## 2019-10-18 NOTE — Consult Note (Signed)
[place] Nurse Consult Note: Reason for Consult: new (with 2 week's duration) full thickness ulceration at the plantar aspect of the right midfoot. Wound type: Neuropathic Pressure Injury POA: N/A Measurement: 5.5cm x 6cm with depth obscured by the presence of necrotic tissue. Wound bed:dry, black eschar Drainage (amount, consistency, odor) None Periwound:intact Dressing procedure/placement/frequency: I have provided Nursing with guidance via the Orders for topical care using an enzymatic debridement ointment (collagenase/Santyl). According to the note by Dr. Eliseo Squires, patient is to determine if he would like to be worked up here or at [facility]. If patient stays in house, recommend consideration of orthopedic or podiatric medicine consultation for this plantar ulcer. Offloading will be an essential element of the POC.  [place] nursing team will not follow, but will remain available to this patient, the nursing and medical teams.  Please re-consult if needed. Thanks, Maudie Flakes, MSN, RN, [place], Arther Abbott  Pager# [phone]

## 2019-10-18 NOTE — Progress Notes (Signed)
Pt refusing bed alarm and assistance with ambulation Educated pt on high fall risk  Pt also refusing cardiac monitoring, MD aware and discontinued order, CCMD aware.  Pt belongings within reach including bedside table and call light. Pt has non skid socks on, bed in lowest position, floor mat in place, and yellow fall risk band placed   Will continue to monitor

## 2019-10-18 NOTE — Progress Notes (Signed)
VASCULAR LAB    ABIs completed.    Preliminary report:  See CV proc for preliminary results.  Rosbel Buckner, RVT 10/18/2019, 9:19 AM

## 2019-10-19 ENCOUNTER — Inpatient Hospital Stay (HOSPITAL_COMMUNITY): Payer: Medicaid Other

## 2019-10-19 ENCOUNTER — Encounter (HOSPITAL_COMMUNITY): Payer: Self-pay | Admitting: *Deleted

## 2019-10-19 DIAGNOSIS — N179 Acute kidney failure, unspecified: Secondary | ICD-10-CM | POA: Diagnosis not present

## 2019-10-19 DIAGNOSIS — S91301A Unspecified open wound, right foot, initial encounter: Secondary | ICD-10-CM | POA: Diagnosis not present

## 2019-10-19 DIAGNOSIS — F17200 Nicotine dependence, unspecified, uncomplicated: Secondary | ICD-10-CM | POA: Diagnosis not present

## 2019-10-19 DIAGNOSIS — N184 Chronic kidney disease, stage 4 (severe): Secondary | ICD-10-CM

## 2019-10-19 DIAGNOSIS — F1410 Cocaine abuse, uncomplicated: Secondary | ICD-10-CM | POA: Diagnosis present

## 2019-10-19 LAB — GLUCOSE, CAPILLARY
Glucose-Capillary: 145 mg/dL — ABNORMAL HIGH (ref 70–99)
Glucose-Capillary: 138 mg/dL — ABNORMAL HIGH (ref 70–99)
Glucose-Capillary: 229 mg/dL — ABNORMAL HIGH (ref 70–99)
Glucose-Capillary: 128 mg/dL — ABNORMAL HIGH (ref 70–99)

## 2019-10-19 LAB — CBC
HCT: 28.1 % — ABNORMAL LOW (ref 39.0–52.0)
Hemoglobin: 8.4 g/dL — ABNORMAL LOW (ref 13.0–17.0)
MCH: 25.8 pg — ABNORMAL LOW (ref 26.0–34.0)
MCHC: 29.9 g/dL — ABNORMAL LOW (ref 30.0–36.0)
MCV: 86.2 fL (ref 80.0–100.0)
Platelets: 415 10*3/uL — ABNORMAL HIGH (ref 150–400)
RBC: 3.26 MIL/uL — ABNORMAL LOW (ref 4.22–5.81)
RDW: 14.8 % (ref 11.5–15.5)
WBC: 6.1 10*3/uL (ref 4.0–10.5)
nRBC: 0 % (ref 0.0–0.2)

## 2019-10-19 LAB — BASIC METABOLIC PANEL
Anion gap: 12 (ref 5–15)
BUN: 85 mg/dL — ABNORMAL HIGH (ref 6–20)
CO2: 19 mmol/L — ABNORMAL LOW (ref 22–32)
Calcium: 7.7 mg/dL — ABNORMAL LOW (ref 8.9–10.3)
Chloride: 111 mmol/L (ref 98–111)
Creatinine, Ser: 5.43 mg/dL — ABNORMAL HIGH (ref 0.61–1.24)
GFR calc Af Amer: 12 mL/min — ABNORMAL LOW (ref >60)
GFR calc non Af Amer: 11 mL/min — ABNORMAL LOW (ref >60)
Glucose, Bld: 150 mg/dL — ABNORMAL HIGH (ref 70–99)
Potassium: 4 mmol/L (ref 3.5–5.1)
Sodium: 142 mmol/L (ref 135–145)

## 2019-10-19 MED ORDER — JUVEN PO PACK
1.0000 | PACK | Freq: Two times a day (BID) | ORAL | Status: DC
  Administered 2019-10-20 – 2019-11-16 (×35): 1 via ORAL
  Filled 2019-10-19 (×61): qty 1

## 2019-10-19 MED ORDER — ADULT MULTIVITAMIN W/MINERALS CH
1.0000 | ORAL_TABLET | Freq: Every day | ORAL | Status: DC
  Administered 2019-10-19 – 2019-12-12 (×53): 1 via ORAL
  Filled 2019-10-19 (×55): qty 1

## 2019-10-19 MED ORDER — ENSURE MAX PROTEIN PO LIQD
11.0000 [oz_av] | Freq: Three times a day (TID) | ORAL | Status: DC
  Administered 2019-10-19 – 2019-10-22 (×6): 11 [oz_av] via ORAL
  Filled 2019-10-19 (×11): qty 330

## 2019-10-19 NOTE — Progress Notes (Signed)
Initial Nutrition Assessment  DOCUMENTATION CODES:   Obesity unspecified  INTERVENTION:  -Ensure Max po TID, each supplement provides 150 kcal and 30 grams of protein  -MVI daily  -1 packet Juven BID, each packet provides 95 calories, 2.5 grams of protein (collagen), and 9.8 grams of carbohydrate (3 grams sugar); also contains 7 grams of L-arginine and L-glutamine, 300 mg vitamin C, 15 mg vitamin E, 1.2 mcg vitamin B-12, 9.5 mg zinc, 200 mg calcium, and 1.5 g  Calcium Beta-hydroxy-Beta-methylbutyrate to support wound healing    NUTRITION DIAGNOSIS:   Increased nutrient needs related to wound healing as evidenced by estimated needs.    GOAL:   Patient will meet greater than or equal to 90% of their needs    MONITOR:   PO intake, Supplement acceptance, Skin, Labs, Weight trends, I & O's  REASON FOR ASSESSMENT:   Consult Wound healing  ASSESSMENT:   Pt presented with wound of R foot and hyperglycemia. PMH significant for kidney disease, T2DM, CHF, HTN, diabetic neuropathy  Pt not in room at time of visit.   Per H&P, pt has had a decreased appetite for 3-4 days PTA and has been eating only half of what he normally does.    Per wt readings, pt with potential 11.9% wt loss x5 months and an 8.3% wt loss x2 months, both significant for time frame.   PO intake: 75-100% x 3 recorded meals  UOP: 751ml x24 hours I/O: +1,018.42ml since admit  Labs: CBGs 145-228 Medications: Novolog, Lantus  NUTRITION - FOCUSED PHYSICAL EXAM:  Unable to perform at this time, will attempt at follow-up.   Diet Order:   Diet Order            Diet Carb Modified Fluid consistency: Thin; Room service appropriate? Yes  Diet effective now                 EDUCATION NEEDS:   No education needs have been identified at this time  Skin:  Skin Assessment: Skin Integrity Issues: Skin Integrity Issues:: Diabetic Ulcer, Other (Comment) Diabetic Ulcer: R foot Other: amputation - all  toes  Last BM:  unknown  Height:   Ht Readings from Last 1 Encounters:  10/17/19 6\' 3"  (1.905 m)    Weight:   Wt Readings from Last 10 Encounters:  10/19/19 133.2 kg  08/30/19 (!) 145.6 kg  08/20/19 (!) 145.6 kg  06/11/19 (!) 151.5 kg  05/15/19 (!) 140.6 kg  12/14/18 (!) 140.6 kg    BMI:  Body mass index is 36.7 kg/m.  Estimated Nutritional Needs:   Kcal:  2850-3050  Protein:  160-180 grams  Fluid:  >2L/d    Larkin Ina, MS, RD, LDN RD pager number and weekend/on-call pager number located in [place].

## 2019-10-19 NOTE — Progress Notes (Signed)
Progress Note    Zachary Young  PIR:[id number] DOB: Jun 24, 1960  DOA: 10/17/2019 PCP: System, Pcp Not In    Brief Narrative:     Medical records reviewed and are as summarized below:  Zachary Young is an 59 y.o. male with medical history significant of kidney disease, diabetes type 2, chronic combined CHF, HTN, diabetic neuropathy with pain who presented after passing out.  Zachary Young notes that today, he went out on to his front porch to get some fresh air.  Once out there, he became dizzy and had to lean against the house, and then he passed out.  He slid down the side of the house and did not hit his head.  He noted no chest pain, no fever, no chills, no change in urination.  He does note that he has had a decreased appetite for the last 3-4 days, eating only about half of what he normally does.  He has passed out before in the past when his sugar drops, and he thinks this may have happened today.  He denies chest pain.  He has SOB with ambulating at times, but nothing today.  He report no change in urination and feels like he is urinating the normal amount.  He reports taking torsemide 100m in the morning and evening (4 tablets) and not missing doses.  He was having issues with pain in bed, and as I looked at his legs, I noted that his right leg was swollen, warm and mildly erythematous compared to the left.  When asked, he noted a new wound on the plantar surface, at the 4th and 5th digit which he noticed first about 2 weeks ago.   Assessment/Plan:   Active Problems:   CHF (congestive heart failure) (HCC)   Chronic kidney disease, stage 4 (severe) (HCC)   HLD (hyperlipidemia)   HTN (hypertension)   Tobacco use disorder   Type 2 diabetes mellitus with circulatory disorder, with long-term current use of insulin (HCC)   Wound of right foot   Type 2 diabetes mellitus with circulatory disorder, with long-term current use of insulin (HCC) Wound of right foot Hyperglycemia - Xray of  the foot suspicious for osteomyelitis and this is supported by ESR/CRP being elevated - Start Abx with vancomycin, flagyl and rocephin - BC X 2 - Continue to work towards blood sugar control - Last A1C in May was 8.5 - ABI done but unreliable - refused MRI  Syncope -likely multifactorial including overdiuresis, acute on chronic kidney disease and acute infection - refused telemetry - TTE from May was very difficult given habitus     Chronic combined CHF (congestive heart failure) - Currently appears to be controlled, he is, if anything, volume down given kidneys - 500cc bolus - Cut torsemide in half for today, 443mBID - He reports not being on beta blocker, so will hold for now (would need coreg as he uses cocaine) - Telemetry refused - Daily weight - Strict I/O    Cocaine abuse -encouraged cessation  Acute on Chronic kidney disease, stage 4 (severe) Normocytic anemia - Likely related to decreased PO intake and infection plus diuretic - 500cc bolus in the ED - Trend renal function    HLD (hyperlipidemia) - Outpatient follow up, not on a statin based on review    HTN (hypertension) - Continue amlodipine, torsemide as noted above    Tobacco use disorder - Counseling prior to discharge  obesity Body mass index is 36.7 kg/m.   Family  Communication/Anticipated D/C date and plan/Code Status   DVT prophylaxis:  Code Status: Full Code.  Disposition Plan: Status is: Inpatient  Remains inpatient appropriate because:IV treatments appropriate due to intensity of illness or inability to take PO   Dispo: The patient is from: Home              Anticipated d/c is to: Home              Anticipated d/c date is: > 3 days              Patient currently is not medically stable to d/c.         Medical Consultants:    ortho     Subjective:   Pain controlled Would like to have work up and surgery if needed done here  Objective:    Vitals:   10/18/19 1732  10/18/19 2314 10/19/19 0546 10/19/19 1201  BP: (!) 150/82 123/68 127/82 (!) 154/75  Pulse: 69 77 77 76  Resp: 17 18 17 16   Temp: 97.7 F (36.5 C) 98.2 F (36.8 C) 98.1 F (36.7 C) 97.8 F (36.6 C)  TempSrc: Oral Oral Oral Oral  SpO2: 97% 98% 98% 99%  Weight:   133.2 kg   Height:        Intake/Output Summary (Last 24 hours) at 10/19/2019 1324 Last data filed at 10/19/2019 1141 Gross per 24 hour  Intake 1389.67 ml  Output --  Net 1389.67 ml   Filed Weights   10/17/19 2221 10/18/19 0456 10/19/19 0546  Weight: 135.4 kg 135 kg 133.2 kg    Exam:   General: Appearance:    Obese male in no acute distress     Lungs:     Clear to auscultation bilaterally, respirations unlabored  Heart:    Normal heart rate. Normal rhythm. No murmurs, rubs, or gallops.      Neurologic:   Awake, alert, oriented x 3. Calmer and more interactive today     Data Reviewed:   I have personally reviewed following labs and imaging studies:  Labs: Labs show the following:   Basic Metabolic Panel: Recent Labs  Lab 10/17/19 1533 10/17/19 1533 10/17/19 2312 10/18/19 0815 10/19/19 0549  NA 136  --   --  139 142  K 3.5   < >  --  3.8 4.0  CL 103  --   --  106 111  CO2 19*  --   --  20* 19*  GLUCOSE 264*  --   --  218* 150*  BUN 97*  --   --  88* 85*  CREATININE 5.74*  --  5.45* 5.32* 5.43*  CALCIUM 7.3*  --   --  7.3* 7.7*   < > = values in this interval not displayed.   GFR Estimated Creatinine Clearance: 22.1 mL/min (A) (by C-G formula based on SCr of 5.43 mg/dL (H)). Liver Function Tests: Recent Labs  Lab 10/18/19 0815  AST 11*  ALT 10  ALKPHOS 84  BILITOT 0.5  PROT 8.1  ALBUMIN 2.3*   No results for input(s): LIPASE, AMYLASE in the last 168 hours. No results for input(s): AMMONIA in the last 168 hours. Coagulation profile No results for input(s): INR, PROTIME in the last 168 hours.  CBC: Recent Labs  Lab 10/17/19 1533 10/18/19 0815 10/19/19 0549  WBC 9.7 6.4 6.1  HGB  9.7* 8.5* 8.4*  HCT 31.3* 27.9* 28.1*  MCV 82.6 85.1 86.2  PLT 442* 392 415*   Cardiac  Enzymes: No results for input(s): CKTOTAL, CKMB, CKMBINDEX, TROPONINI in the last 168 hours. BNP (last 3 results) No results for input(s): PROBNP in the last 8760 hours. CBG: Recent Labs  Lab 10/18/19 1058 10/18/19 1557 10/18/19 2113 10/19/19 0543 10/19/19 1103  GLUCAP 167* 169* 228* 145* 138*   D-Dimer: No results for input(s): DDIMER in the last 72 hours. Hgb A1c: Recent Labs    10/17/19 2312  HGBA1C 8.8*   Lipid Profile: No results for input(s): CHOL, HDL, LDLCALC, TRIG, CHOLHDL, LDLDIRECT in the last 72 hours. Thyroid function studies: No results for input(s): TSH, T4TOTAL, T3FREE, THYROIDAB in the last 72 hours.  Invalid input(s): FREET3 Anemia work up: No results for input(s): VITAMINB12, FOLATE, FERRITIN, TIBC, IRON, RETICCTPCT in the last 72 hours. Sepsis Labs: Recent Labs  Lab 10/17/19 1533 10/18/19 0815 10/19/19 0549  WBC 9.7 6.4 6.1    Microbiology Recent Results (from the past 240 hour(s))  SARS Coronavirus 2 by RT PCR (hospital order, performed in [facility] hospital lab) Nasopharyngeal Nasopharyngeal Swab     Status: None   Collection Time: 10/17/19  6:49 PM   Specimen: Nasopharyngeal Swab  Result Value Ref Range Status   SARS Coronavirus 2 NEGATIVE NEGATIVE Final    Comment: (NOTE) SARS-CoV-2 target nucleic acids are NOT DETECTED.  The SARS-CoV-2 RNA is generally detectable in upper and lower respiratory specimens during the acute phase of infection. The lowest concentration of SARS-CoV-2 viral copies this assay can detect is 250 copies / mL. A negative result does not preclude SARS-CoV-2 infection and should not be used as the sole basis for treatment or other patient management decisions.  A negative result may occur with improper specimen collection / handling, submission of specimen other than nasopharyngeal swab, presence of viral mutation(s) within  the areas targeted by this assay, and inadequate number of viral copies (<250 copies / mL). A negative result must be combined with clinical observations, patient history, and epidemiological information.  Fact Sheet for Patients:   StrictlyIdeas.no  Fact Sheet for Healthcare Providers: BankingDealers.co.za  This test is not yet approved or  cleared by the [place] FDA and has been authorized for detection and/or diagnosis of SARS-CoV-2 by FDA under an Emergency Use Authorization (EUA).  This EUA will remain in effect (meaning this test can be used) for the duration of the COVID-19 declaration under Section 564(b)(1) of the Act, 21 U.S.C. section 360bbb-3(b)(1), unless the authorization is terminated or revoked sooner.  Performed at [facility], [place] [address], [place], [place] [address]     Procedures and diagnostic studies:  AP / lateral X-ray Right foot  Result Date: 10/17/2019 CLINICAL DATA:  Right foot wound with blackening along the plantar aspect around the 3rd to 5th metatarsals EXAM: RIGHT FOOT - 2 VIEW COMPARISON:  None. FINDINGS: Remote postsurgical changes from prior transmetatarsal amputation of the second through fifth rays as well as resection of the first ray at the level of the tarsometatarsal joint. Some heterotopic ossification is noted along the resection margins. There is extensive soft tissue swelling about the forefoot including some crescentic linear lucencies of gas along the plantar surface and some focal irregularity which could reflect site of ulceration. Marked edematous changes are seen subjacent to the site of possible ulceration as well as subcortical lucency involving the adjacent metatarsal resection margin, likely about the fourth or fifth metatarsal. Additional retracted soft tissue/cicatricial change noted towards the more dorsal aspect of the forefoot as well. IMPRESSION: Soft tissue  swelling  and ulceration at the plantar surface of the forefoot with findings concerning for osteomyelitis of the adjacent fourth and fifth metatarsal resection margins. Findings suspicious for osteomyelitis. Electronically Signed   By: Lovena Le M.D.   On: 10/17/2019 21:55   VAS [place] ABI WITH/WO TBI  Result Date: 10/18/2019 LOWER EXTREMITY DOPPLER STUDY Indications: Ulceration, and cellulitis. High Risk Factors: Hypertension, Diabetes. Other Factors: Amputation of all 5 toes on bilateral feet, secondary to                osteomyelitis.  Limitations: Today's exam was limited due to venous interference. Comparison Study: No prior study on file Performing Technologist: Sharion Dove RVS  Examination Guidelines: A complete evaluation includes at minimum, Doppler waveform signals and systolic blood pressure reading at the level of bilateral brachial, anterior tibial, and posterior tibial arteries, when vessel segments are accessible. Bilateral testing is considered an integral part of a complete examination. Photoelectric Plethysmograph (PPG) waveforms and toe systolic pressure readings are included as required and additional duplex testing as needed. Limited examinations for reoccurring indications may be performed as noted.  ABI Findings: +---------+------------------+-----+-------------------+----------+ Right    Rt Pressure (mmHg)IndexWaveform           Comment    +---------+------------------+-----+-------------------+----------+ Brachial 151                    triphasic                     +---------+------------------+-----+-------------------+----------+ PTA      121               0.80 dampened monophasic           +---------+------------------+-----+-------------------+----------+ DP       104               0.69 dampened monophasic           +---------+------------------+-----+-------------------+----------+ Great Toe                                          amputation  +---------+------------------+-----+-------------------+----------+ +---------+------------------+-----+-------------------+----------+ Left     Lt Pressure (mmHg)IndexWaveform           Comment    +---------+------------------+-----+-------------------+----------+ Brachial                        triphasic          IV         +---------+------------------+-----+-------------------+----------+ PTA      165               1.09 biphasic                      +---------+------------------+-----+-------------------+----------+ DP       44                0.29 dampened monophasic           +---------+------------------+-----+-------------------+----------+ Great Toe                                          amputation +---------+------------------+-----+-------------------+----------+ +-------+-----------+-----------+------------+------------+ ABI/TBIToday's ABIToday's TBIPrevious ABIPrevious TBI +-------+-----------+-----------+------------+------------+ Right  0.8                                            +-------+-----------+-----------+------------+------------+  Left   1.09                                           +-------+-----------+-----------+------------+------------+  Summary: Right: Resting right ankle-brachial index indicates mild right lower extremity arterial disease. ABIs are unreliable. Left: Resting left ankle-brachial index is within normal range. No evidence of significant left lower extremity arterial disease. ABIs are unreliable.  *See table(s) above for measurements and observations.  Electronically signed by Monica Martinez MD on 10/18/2019 at 1:13:16 PM.    Final    VAS [place] LOWER EXTREMITY VENOUS (DVT)  Result Date: 10/18/2019  Lower Venous DVTStudy Indications: Swelling, Pain, and cellulitis.  Comparison Study: Prior study from 08/16/19 is available for comparison Performing Technologist: Sharion Dove RVS  Examination Guidelines: A complete  evaluation includes B-mode imaging, spectral Doppler, color Doppler, and power Doppler as needed of all accessible portions of each vessel. Bilateral testing is considered an integral part of a complete examination. Limited examinations for reoccurring indications may be performed as noted. The reflux portion of the exam is performed with the patient in reverse Trendelenburg.  +---------+---------------+---------+-----------+----------+-------------------+ RIGHT    CompressibilityPhasicitySpontaneityPropertiesThrombus Aging      +---------+---------------+---------+-----------+----------+-------------------+ CFV      Full           Yes      Yes                                      +---------+---------------+---------+-----------+----------+-------------------+ SFJ      Full                                                             +---------+---------------+---------+-----------+----------+-------------------+ FV Prox  Full                                                             +---------+---------------+---------+-----------+----------+-------------------+ FV Mid   Full                                                             +---------+---------------+---------+-----------+----------+-------------------+ FV Distal                                             patent by color and                                                       Doppler             +---------+---------------+---------+-----------+----------+-------------------+  POP      Full           Yes      Yes                                      +---------+---------------+---------+-----------+----------+-------------------+ PTV      Full                                                             +---------+---------------+---------+-----------+----------+-------------------+ PERO                                                  Not visualized       +---------+---------------+---------+-----------+----------+-------------------+   +----+---------------+---------+-----------+----------+--------------+ LEFTCompressibilityPhasicitySpontaneityPropertiesThrombus Aging +----+---------------+---------+-----------+----------+--------------+ CFV Full           Yes      Yes                                 +----+---------------+---------+-----------+----------+--------------+     Summary: RIGHT: - Findings appear essentially unchanged compared to previous examination. - There is no evidence of deep vein thrombosis in the lower extremity. However, portions of this examination were limited- see technologist comments above.  - Ultrasound characteristics of enlarged lymph nodes are noted in the groin.  LEFT: - No evidence of common femoral vein obstruction. - Ultrasound characteristics of enlarged lymph nodes noted in the groin.  *See table(s) above for measurements and observations. Electronically signed by Monica Martinez MD on 10/18/2019 at 1:16:53 PM.    Final     Medications:   . amLODipine  10 mg Oral Daily  . aspirin EC  81 mg Oral Daily  . collagenase   Topical Daily  . enoxaparin (LOVENOX) injection  30 mg Subcutaneous Q24H  . gabapentin  300 mg Oral Daily   And  . gabapentin  600 mg Oral QHS  . insulin aspart  0-20 Units Subcutaneous TID WC  . insulin aspart  0-5 Units Subcutaneous QHS  . insulin glargine  10 Units Subcutaneous QHS  . sodium chloride flush  3 mL Intravenous Q12H  . torsemide  40 mg Oral BID  . vancomycin variable dose per unstable renal function (pharmacist dosing)   Does not apply See admin instructions   Continuous Infusions: . cefTRIAXone (ROCEPHIN)  IV 2 g (10/18/19 2057)  . metronidazole 500 mg (10/19/19 0610)     LOS: 2 days   Geradine Girt  [facility]   How to contact the [facility] Attending or Consulting provider [place] or covering provider during after hours [place], for this patient?   1. Check the care team in [facility] and look for a) attending/consulting [facility] provider listed and b) the [facility] team listed 2. Log into [email or web address] and use [place] universal password to access. If you do not have the password, please contact the hospital operator. 3. Locate the [facility] provider you are looking for under [facility] and page to a number that you can be directly reached. 4. If you  still have difficulty reaching the provider, please page the [facility] (Director on Call) for the Hospitalists listed on amion for assistance.  10/19/2019, 1:24 PM

## 2019-10-19 NOTE — Progress Notes (Signed)
Patient ID: Zachary Young, male   DOB: [birth year], 59 y.o.   MRN: [id number] I have spoken with Dr.Duda about this patient and added the patient to our patient list.  Dr. Sharol Given indicated that he would see the patient tomorrow morning.

## 2019-10-19 NOTE — Progress Notes (Signed)
Inpatient Diabetes Program Recommendations  AACE/ADA: New Consensus Statement on Inpatient Glycemic Control (2015)  Target Ranges:  Prepandial:   less than 140 mg/dL      Peak postprandial:   less than 180 mg/dL (1-2 hours)      Critically ill patients:  140 - 180 mg/dL   Lab Results  Component Value Date   GLUCAP 138 (H) 10/19/2019   HGBA1C 8.8 (H) 10/17/2019    Review of Glycemic Control Results for Zachary Young, Zachary Young (MRN [id number]) as of 10/19/2019 15:33  Ref. Range 10/18/2019 10:58 10/18/2019 15:57 10/18/2019 21:13 10/19/2019 05:43 10/19/2019 11:03  Glucose-Capillary Latest Ref Range: 70 - 99 mg/dL 167 (H) 169 (H) 228 (H) 145 (H) 138 (H)   Diabetes history:  DM2  Outpatient Diabetes medications:  Lantus 10 units daily Humalog 4 units daily   Current orders for Inpatient glycemic control:  Lantus 10 units at bedtime Novolog 0-20 units tid  Novolog 0-5 units qhs  Note:  Spoke with patient at bedside.  Reviewed patient's current A1c of 8.8% (average blood sugar of 205 mg/dl). Explained what a A1c is and what it measures. Also reviewed goal A1c with patient, importance of good glucose control @ home, and blood sugar goals.  Admits to not taking his insulins regularly.  Educated on long and short term complications of uncontrolled diabetes.  Reviewed the Plate Method, importance of cutting out sugary beverages and CHO's and food that contain CHO's.  He has a functioning glucometer at home and asked his to check his cbg's before breakfast and mid afternoon.  3-4 times daily would be ideal.  He should bring his meter with him to PCP follow up.    He denies difficulty obtaining medications;  He has Medicaid.  Reviewed hypoglycemia, symptoms and treatment.  He states he will start taking his medications as prescribed.  He is current with his PC, Dr. Bobbe Medico.    Will continue to follow while inpatient.  Thank you, Reche Dixon, RN, BSN Diabetes Coordinator Inpatient Diabetes Program [phone]  (team pager from 8a-5p)

## 2019-10-19 NOTE — TOC Benefit Eligibility Note (Signed)
Transition of Care [facility]) Benefit Eligibility Note    Patient Details  Name: Zachary Young MRN: [id number] Date of Birth: 1960-12-26                           Additional Notes: MEDICAID OF [place]   and   [facility], [place] Phone Number: 10/19/2019, 9:37 AM

## 2019-10-19 NOTE — Progress Notes (Signed)
Patient arrived at MRI holding.  When screening patient and explaining the scan that was to be done patient refused to have MRI scan performed.  Patient stated that he was "going to have that leg cut off so why were we looking at my foot?".  Verified with patient that he was talking about the right leg as we were to scan his right foot and agreed that was the side he was talking about.  Notified the RN of patient's refusal and sent patient back to his room.

## 2019-10-19 NOTE — TOC Initial Note (Signed)
Transition of Care [facility]) - Initial/Assessment Note    Patient Details  Name: Zachary Young MRN: [id number] Date of Birth: July 18, 1960  Transition of Care [facility]) CM/SW Contact:    Marilu Favre, RN Phone Number: 10/19/2019, 3:18 PM  Clinical Narrative:                  Spoke to patient at bedside, confirmed address.   PCP is with [phone] patient unsure of PCP name.   Patient lives alone. He has a walker and 3 canes at home. He gave he wheel chair away.   His home phone is [phone] cell [phone].    Patient can return to his address, however he asked for low income housing resources. Patient stated "If they cut my leg off an apartment would be easier then my mobile home. ". Patient is on parole until next year, which disqualifies him from housing . NCM still offered to provide resources , patient declined.        Patient Goals and CMS Choice   CMS Medicare.gov Compare Post Acute Care list provided to:: Patient    Expected Discharge Plan and Services     Discharge Planning Services: CM Consult   Living arrangements for the past 2 months: Single Family Home                                      Prior Living Arrangements/Services Living arrangements for the past 2 months: Single Family Home Lives with:: Self Patient language and need for interpreter reviewed:: Yes Do you feel safe going back to the place where you live?: Yes          Current home services: DME Criminal Activity/Legal Involvement Pertinent to Current Situation/Hospitalization: No - Comment as needed  Activities of Daily Living Home Assistive Devices/Equipment: Other (Comment)    Permission Sought/Granted   Permission granted to share information with : No              Emotional Assessment Appearance:: Appears stated age Attitude/Demeanor/Rapport: Guarded Affect (typically observed): Agitated Orientation: : Oriented to Self, Oriented to  Place, Oriented to  Time, Oriented to Situation   Psych Involvement: No (comment)  Admission diagnosis:  Syncope, unspecified syncope type [R55] Acute kidney injury superimposed on chronic kidney disease ([place]) [N17.9, N18.9] Wound of right foot [S91.301A] Patient Active Problem List   Diagnosis Date Noted  . Cocaine abuse ([place]) 10/19/2019  . Wound of right foot 10/17/2019  . Acute kidney injury superimposed on chronic kidney disease ([place])   . Syncope   . Dyspnea   . Abdominal pain   . CHF (congestive heart failure) ([place]) 08/15/2019  . Acute hypoxemic respiratory failure ([place])   . Anasarca 06/17/2019  . Gastritis 05/26/2019  . GI bleed 12/14/2018  . Chronic kidney disease, stage 4 (severe) ([place]) 06/26/2018  . Acute on chronic combined systolic and diastolic heart failure ([place]) 06/26/2018  . Moderate nonproliferative retinopathy due to secondary diabetes ([place]) 04/30/2018  . HLD (hyperlipidemia) 02/28/2018  . History of MI (myocardial infarction) 05/22/2016  . History of osteomyelitis 05/22/2016  . Tobacco use disorder 03/27/2016  . HTN (hypertension) 03/26/2016  . Type 2 diabetes mellitus with circulatory disorder, with long-term current use of insulin ([place]) 03/26/2016  . Toe amputation status 03/17/2014   PCP:  System, Pcp Not In Pharmacy:   [id number] [address], [address] [place] [address] Phone: [phone] Fax: [phone]     Social Determinants of Health (SDOH) Interventions    Readmission Risk Interventions No flowsheet data found.

## 2019-10-20 ENCOUNTER — Other Ambulatory Visit: Payer: Self-pay | Admitting: Physician Assistant

## 2019-10-20 DIAGNOSIS — E1152 Type 2 diabetes mellitus with diabetic peripheral angiopathy with gangrene: Secondary | ICD-10-CM

## 2019-10-20 DIAGNOSIS — I10 Essential (primary) hypertension: Secondary | ICD-10-CM

## 2019-10-20 DIAGNOSIS — Z794 Long term (current) use of insulin: Secondary | ICD-10-CM

## 2019-10-20 DIAGNOSIS — R55 Syncope and collapse: Secondary | ICD-10-CM | POA: Diagnosis not present

## 2019-10-20 DIAGNOSIS — S91301A Unspecified open wound, right foot, initial encounter: Secondary | ICD-10-CM

## 2019-10-20 DIAGNOSIS — M86271 Subacute osteomyelitis, right ankle and foot: Secondary | ICD-10-CM

## 2019-10-20 LAB — BASIC METABOLIC PANEL
Anion gap: 15 (ref 5–15)
BUN: 83 mg/dL — ABNORMAL HIGH (ref 6–20)
CO2: 19 mmol/L — ABNORMAL LOW (ref 22–32)
Calcium: 8.2 mg/dL — ABNORMAL LOW (ref 8.9–10.3)
Chloride: 107 mmol/L (ref 98–111)
Creatinine, Ser: 5.13 mg/dL — ABNORMAL HIGH (ref 0.61–1.24)
GFR calc Af Amer: 13 mL/min — ABNORMAL LOW (ref >60)
GFR calc non Af Amer: 12 mL/min — ABNORMAL LOW (ref >60)
Glucose, Bld: 181 mg/dL — ABNORMAL HIGH (ref 70–99)
Potassium: 4.3 mmol/L (ref 3.5–5.1)
Sodium: 141 mmol/L (ref 135–145)

## 2019-10-20 LAB — GLUCOSE, CAPILLARY
Glucose-Capillary: 140 mg/dL — ABNORMAL HIGH (ref 70–99)
Glucose-Capillary: 136 mg/dL — ABNORMAL HIGH (ref 70–99)
Glucose-Capillary: 274 mg/dL — ABNORMAL HIGH (ref 70–99)
Glucose-Capillary: 180 mg/dL — ABNORMAL HIGH (ref 70–99)

## 2019-10-20 LAB — CBC
HCT: 29 % — ABNORMAL LOW (ref 39.0–52.0)
Hemoglobin: 8.7 g/dL — ABNORMAL LOW (ref 13.0–17.0)
MCH: 26.4 pg (ref 26.0–34.0)
MCHC: 30 g/dL (ref 30.0–36.0)
MCV: 87.9 fL (ref 80.0–100.0)
Platelets: 417 10*3/uL — ABNORMAL HIGH (ref 150–400)
RBC: 3.3 MIL/uL — ABNORMAL LOW (ref 4.22–5.81)
RDW: 14.8 % (ref 11.5–15.5)
WBC: 6.2 10*3/uL (ref 4.0–10.5)
nRBC: 0 % (ref 0.0–0.2)

## 2019-10-20 LAB — VANCOMYCIN, RANDOM: Vancomycin Rm: 13

## 2019-10-20 MED ORDER — DEXTROSE 5 % IV SOLN
3.0000 g | INTRAVENOUS | Status: AC
  Administered 2019-10-21: 3 g via INTRAVENOUS
  Filled 2019-10-20 (×2): qty 3000

## 2019-10-20 MED ORDER — VANCOMYCIN HCL 1250 MG/250ML IV SOLN
1250.0000 mg | Freq: Once | INTRAVENOUS | Status: AC
  Administered 2019-10-20: 1250 mg via INTRAVENOUS
  Filled 2019-10-20: qty 250

## 2019-10-20 NOTE — Consult Note (Signed)
ORTHOPAEDIC CONSULTATION  REQUESTING PHYSICIAN: Geradine Girt, DO  Chief Complaint: chronic ulcer right foot  HPI: Zachary Young is a 59 y.o. male who presents with necrotic ulcer plantar lateral aspect right foot with exposed bone.  Patient states that he has been unable to obtain diabetic shoe wear.  He states he walks around in hospital socks for all activities of daily living and all weather conditions.  Past Medical History:  Diagnosis Date  . CHF (congestive heart failure) ([place])   . Diabetes mellitus without complication ([place])   . Peripheral edema   . Renal disorder    kidney disease   Past Surgical History:  Procedure Laterality Date  . TOE AMPUTATION Bilateral    due to osteomyelitis, all 5 each foot   Social History   Socioeconomic History  . Marital status: Single    Spouse name: Not on file  . Number of children: Not on file  . Years of education: Not on file  . Highest education level: Not on file  Occupational History  . Not on file  Tobacco Use  . Smoking status: Current Every Day Smoker    Packs/day: 0.50    Types: Cigarettes  . Smokeless tobacco: Never Used  Vaping Use  . Vaping Use: Never used  Substance and Sexual Activity  . Alcohol use: Not Currently  . Drug use: Yes    Types: Cocaine  . Sexual activity: Not on file  Other Topics Concern  . Not on file  Social History Narrative  . Not on file   Social Determinants of Health   Financial Resource Strain:   . Difficulty of Paying Living Expenses:   Food Insecurity:   . Worried About Charity fundraiser in the Last Year:   . Arboriculturist in the Last Year:   Transportation Needs:   . Film/video editor (Medical):   Marland Kitchen Lack of Transportation (Non-Medical):   Physical Activity:   . Days of Exercise per Week:   . Minutes of Exercise per Session:   Stress:   . Feeling of Stress :   Social Connections:   . Frequency of Communication with Friends and Family:   . Frequency of  Social Gatherings with Friends and Family:   . Attends Religious Services:   . Active Member of Clubs or Organizations:   . Attends Archivist Meetings:   Marland Kitchen Marital Status:    Family History  Problem Relation Age of Onset  . Cancer Mother    - negative except otherwise stated in the family history section Allergies  Allergen Reactions  . Bee Venom Anaphylaxis    Per pt, nearly died from bee sting as a child  . Propofol Other (See Comments)     Hallucinations  . Eggs Or Egg-Derived Products Nausea And Vomiting and Other (See Comments)    Stomach cramps  . Morphine Nausea And Vomiting  . Oxycodone Nausea And Vomiting   Prior to Admission medications   Medication Sig Start Date End Date Taking? Authorizing Provider  albuterol (VENTOLIN HFA) 108 (90 Base) MCG/ACT inhaler Inhale 1 puff into the lungs every 4 (four) hours as needed for wheezing or shortness of breath. 08/20/19  Yes Gladys Damme, MD  amLODipine (NORVASC) 10 MG tablet Take 1 tablet (10 mg total) by mouth daily. 08/21/19  Yes Gladys Damme, MD  aspirin EC 81 MG EC tablet Take 1 tablet (81 mg total) by mouth daily. 08/21/19  Yes Gladys Damme,  MD  ergocalciferol (VITAMIN D2) 1.25 MG (50000 UT) capsule Take 50,000 Units by mouth every Friday.  07/13/19  Yes [provider]  gabapentin (NEURONTIN) 300 MG capsule Take 300-600 mg by mouth See admin instructions. Take one capsule (300 mg) by mouth every morning and two capsules (600 mg) at night   Yes [provider]  insulin glargine (LANTUS) 100 UNIT/ML injection Inject 10 Units into the skin at bedtime.    Yes [provider]  insulin lispro (HUMALOG) 100 UNIT/ML injection Inject 4 Units into the skin 3 (three) times daily with meals.    Yes [provider]  oxyCODONE-acetaminophen (PERCOCET/ROXICET) 5-325 MG tablet Take by mouth every 6 (six) hours as needed for severe pain.   Yes [provider]  torsemide (DEMADEX) 20  MG tablet Take 4 tablets (80 mg total) by mouth 2 (two) times daily. Patient taking differently: Take 20 mg by mouth daily.  08/20/19  Yes Gladys Damme, MD  Insulin Pen Needle (PEN NEEDLES 31GX5/16") 31G X 8 MM MISC 1 each by Other route daily. 05/16/19 05/15/20  [provider]  Insulin Syringe-Needle U-100 (GLOBAL INJECT EASE INSULIN SYR) 30G X 1/2" 0.5 ML MISC 1 each by Other route 4 (four) times daily as needed. Insulin 07/13/19   [provider]  metoprolol succinate (TOPROL-XL) 100 MG 24 hr tablet Take 100 mg by mouth daily. Take with or immediately following a meal. Patient not taking: Reported on 10/17/2019    [provider]  potassium chloride 20 MEQ TBCR Take 40 mEq by mouth daily. Patient not taking: Reported on 10/17/2019 08/20/19 09/19/19  Eulis Foster, MD   VAS [place] ABI WITH/WO TBI  Result Date: 10/18/2019 LOWER EXTREMITY DOPPLER STUDY Indications: Ulceration, and cellulitis. High Risk Factors: Hypertension, Diabetes. Other Factors: Amputation of all 5 toes on bilateral feet, secondary to                osteomyelitis.  Limitations: Today's exam was limited due to venous interference. Comparison Study: No prior study on file Performing Technologist: Sharion Dove RVS  Examination Guidelines: A complete evaluation includes at minimum, Doppler waveform signals and systolic blood pressure reading at the level of bilateral brachial, anterior tibial, and posterior tibial arteries, when vessel segments are accessible. Bilateral testing is considered an integral part of a complete examination. Photoelectric Plethysmograph (PPG) waveforms and toe systolic pressure readings are included as required and additional duplex testing as needed. Limited examinations for reoccurring indications may be performed as noted.  ABI Findings: +---------+------------------+-----+-------------------+----------+ Right    Rt Pressure (mmHg)IndexWaveform           Comment     +---------+------------------+-----+-------------------+----------+ Brachial 151                    triphasic                     +---------+------------------+-----+-------------------+----------+ PTA      121               0.80 dampened monophasic           +---------+------------------+-----+-------------------+----------+ DP       104               0.69 dampened monophasic           +---------+------------------+-----+-------------------+----------+ Great Toe  amputation +---------+------------------+-----+-------------------+----------+ +---------+------------------+-----+-------------------+----------+ Left     Lt Pressure (mmHg)IndexWaveform           Comment    +---------+------------------+-----+-------------------+----------+ Brachial                        triphasic          IV         +---------+------------------+-----+-------------------+----------+ PTA      165               1.09 biphasic                      +---------+------------------+-----+-------------------+----------+ DP       44                0.29 dampened monophasic           +---------+------------------+-----+-------------------+----------+ Great Toe                                          amputation +---------+------------------+-----+-------------------+----------+ +-------+-----------+-----------+------------+------------+ ABI/TBIToday's ABIToday's TBIPrevious ABIPrevious TBI +-------+-----------+-----------+------------+------------+ Right  0.8                                            +-------+-----------+-----------+------------+------------+ Left   1.09                                           +-------+-----------+-----------+------------+------------+  Summary: Right: Resting right ankle-brachial index indicates mild right lower extremity arterial disease. ABIs are unreliable. Left: Resting left ankle-brachial index  is within normal range. No evidence of significant left lower extremity arterial disease. ABIs are unreliable.  *See table(s) above for measurements and observations.  Electronically signed by Monica Martinez MD on 10/18/2019 at 1:13:16 PM.    Final    VAS [place] LOWER EXTREMITY VENOUS (DVT)  Result Date: 10/18/2019  Lower Venous DVTStudy Indications: Swelling, Pain, and cellulitis.  Comparison Study: Prior study from 08/16/19 is available for comparison Performing Technologist: Sharion Dove RVS  Examination Guidelines: A complete evaluation includes B-mode imaging, spectral Doppler, color Doppler, and power Doppler as needed of all accessible portions of each vessel. Bilateral testing is considered an integral part of a complete examination. Limited examinations for reoccurring indications may be performed as noted. The reflux portion of the exam is performed with the patient in reverse Trendelenburg.  +---------+---------------+---------+-----------+----------+-------------------+ RIGHT    CompressibilityPhasicitySpontaneityPropertiesThrombus Aging      +---------+---------------+---------+-----------+----------+-------------------+ CFV      Full           Yes      Yes                                      +---------+---------------+---------+-----------+----------+-------------------+ SFJ      Full                                                             +---------+---------------+---------+-----------+----------+-------------------+  FV Prox  Full                                                             +---------+---------------+---------+-----------+----------+-------------------+ FV Mid   Full                                                             +---------+---------------+---------+-----------+----------+-------------------+ FV Distal                                             patent by color and                                                        Doppler             +---------+---------------+---------+-----------+----------+-------------------+ POP      Full           Yes      Yes                                      +---------+---------------+---------+-----------+----------+-------------------+ PTV      Full                                                             +---------+---------------+---------+-----------+----------+-------------------+ PERO                                                  Not visualized      +---------+---------------+---------+-----------+----------+-------------------+   +----+---------------+---------+-----------+----------+--------------+ LEFTCompressibilityPhasicitySpontaneityPropertiesThrombus Aging +----+---------------+---------+-----------+----------+--------------+ CFV Full           Yes      Yes                                 +----+---------------+---------+-----------+----------+--------------+     Summary: RIGHT: - Findings appear essentially unchanged compared to previous examination. - There is no evidence of deep vein thrombosis in the lower extremity. However, portions of this examination were limited- see technologist comments above.  - Ultrasound characteristics of enlarged lymph nodes are noted in the groin.  LEFT: - No evidence of common femoral vein obstruction. - Ultrasound characteristics of enlarged lymph nodes noted in the groin.  *See table(s) above for measurements and observations. Electronically signed by Monica Martinez MD on 10/18/2019 at 1:16:53 PM.    Final    - pertinent xrays, CT, MRI  studies were reviewed and independently interpreted  Positive ROS: All other systems have been reviewed and were otherwise negative with the exception of those mentioned in the HPI and as above.  Physical Exam: General: Alert, no acute distress Psychiatric: Patient is competent for consent with normal mood and affect Lymphatic: No axillary or cervical  lymphadenopathy Cardiovascular: No pedal edema Respiratory: No cyanosis, no use of accessory musculature GI: No organomegaly, abdomen is soft and non-tender    Images:  @ENCIMAGES @  Labs:  Lab Results  Component Value Date   HGBA1C 8.8 (H) 10/17/2019   HGBA1C 8.5 (H) 08/16/2019   HGBA1C 11.1 (H) 12/14/2018   ESRSEDRATE >140 (H) 10/17/2019   CRP 13.0 (H) 10/17/2019    Lab Results  Component Value Date   ALBUMIN 2.3 (L) 10/18/2019   ALBUMIN 2.3 (L) 08/30/2019   ALBUMIN 2.1 (L) 08/16/2019   PREALBUMIN 13.2 (L) 10/17/2019    Neurologic: Patient does not have protective sensation bilateral lower extremities.   MUSCULOSKELETAL:   Skin: Examination patient has a large necrotic ulcer over the plantar lateral aspect of the right foot there is exposed bone.  Radiograph shows destructive bony changes in the fifth metatarsal consistent with chronic osteomyelitis.  Patient has pitting edema in the right lower extremity up to the tibial tubercle.  I cannot palpate a pulse.  Ankle-brachial indices shows dampened monophasic flow with a dorsalis pedis ABI of 0.29.  Patient has hemoglobin A1c of 8.8 with an albumin of 2.3.  Patient is status post a short transmetatarsal amputation on the right and gastrocnemius recession at [place] by podiatry, and status post left TMA 2019  Assessment: Assessment: Uncontrolled type 2 diabetes with necrotic plantar ulcer right foot with osteomyelitis status post transmetatarsal amputation.  Plan: Plan: Discussed with the patient there are not any further foot salvage intervention options due to the short transmetatarsal amputation and with excision of the large necrotic ulcer and infected bone there are not any foot salvage intervention options.  Will need to proceed with a transtibial amputation we will plan for surgery tomorrow Wednesday.  Thank you for the consult and the opportunity to see Mr. Malissa Hippo, MD [facility] [phone] 7:19 AM

## 2019-10-20 NOTE — Progress Notes (Signed)
Progress Note    Peighton Edgin  ZOX:[id number] DOB: 1960-07-25  DOA: 10/17/2019 PCP: System, Pcp Not In    Brief Narrative:     Medical records reviewed and are as summarized below:  Zachary Young is an 59 y.o. male with medical history significant of kidney disease, diabetes type 2, chronic combined CHF, HTN, diabetic neuropathy with pain who presented after passing out.  Mr. Chuong notes that today, he went out on to his front porch to get some fresh air.  Once out there, he became dizzy and had to lean against the house, and then he passed out.  He slid down the side of the house and did not hit his head.  He noted no chest pain, no fever, no chills, no change in urination.  He does note that he has had a decreased appetite for the last 3-4 days, eating only about half of what he normally does.  He has passed out before in the past when his sugar drops, and he thinks this may have happened today.  He denies chest pain.  He has SOB with ambulating at times, but nothing today.  He report no change in urination and feels like he is urinating the normal amount.  He reports taking torsemide 70m in the morning and evening (4 tablets) and not missing doses.  He was having issues with pain in bed, and as I looked at his legs, I noted that his right leg was swollen, warm and mildly erythematous compared to the left.  When asked, he noted a new wound on the plantar surface, at the 4th and 5th digit which he noticed first about 2 weeks ago.   Assessment/Plan:   Active Problems:   CHF (congestive heart failure) (HCC)   Chronic kidney disease, stage 4 (severe) (HCC)   HLD (hyperlipidemia)   HTN (hypertension)   Tobacco use disorder   Type 2 diabetes mellitus with circulatory disorder, with long-term current use of insulin (HCC)   Wound of right foot   [place]ocaine abuse ([place]   [place]el[place], [place] ankle and foot [place]   Type 2 diabetes mellitus with circulatory disorder, with long-term  current use of insulin (HCC) Wound of right foot Hyperglycemia - Xray of the foot suspicious for osteomyelitis and this is supported by ESR/CRP being elevated - Start Abx with vancomycin, flagyl and rocephin - BC X 2 - Continue to work towards blood sugar control - Last A1C in May was 8.5 - ABI done but unreliable - refused MRI -plan for surgery in the AM  Syncope -likely multifactorial including overdiuresis, acute on chronic kidney disease and acute infection - refused telemetry - TTE from May was very difficult given habitus     Chronic combined CHF (congestive heart failure) - Currently appears to be controlled, he is, if anything, volume down given kidneys - 500cc bolus - Cut torsemide in half for today, 474mBID - He reports not being on beta blocker, so will hold for now (would need coreg as he uses cocaine) - Telemetry refused - Daily weight - Strict I/O    Cocaine abuse -encouraged cessation  Acute on Chronic kidney disease, stage 4 (severe) Normocytic anemia - Likely related to decreased PO intake and infection plus diuretic - 500cc bolus in the ED - Trend renal function -would not be a good/compliant candidate for HD    HLD (hyperlipidemia) - Outpatient follow up, not on a statin based on review    HTN (hypertension) -  Continue amlodipine, torsemide as noted above    Tobacco use disorder - Counseling prior to discharge  obesity Body mass index is 37.53 kg/m.   Family Communication/Anticipated D/C date and plan/Code Status   DVT prophylaxis:  Code Status: Full Code.  Disposition Plan: Status is: Inpatient  Remains inpatient appropriate because:IV treatments appropriate due to intensity of illness or inability to take PO   Dispo: The patient is from: Home              Anticipated d/c is to: Home              Anticipated d/c date is: > 3 days              Patient currently is not medically stable to d/c.         Medical Consultants:     ortho     Subjective:   Says he does not have running water at home  Objective:    Vitals:   10/19/19 1809 10/19/19 2326 10/20/19 0548 10/20/19 1247  BP: (!) 152/84 137/85 127/70 (!) 141/71  Pulse: 78 72 72 75  Resp: 18 18 18 18   Temp: 98.1 F (36.7 C) 97.9 F (36.6 C) 98.6 F (37 C) 97.7 F (36.5 C)  TempSrc: Oral Oral Oral Oral  SpO2: 96% 99% 99% 99%  Weight:   (!) 136.2 kg   Height:        Intake/Output Summary (Last 24 hours) at 10/20/2019 1331 Last data filed at 10/20/2019 1200 Gross per 24 hour  Intake 1365.92 ml  Output --  Net 1365.92 ml   Filed Weights   10/18/19 0456 10/19/19 0546 10/20/19 0548  Weight: 135 kg 133.2 kg (!) 136.2 kg    Exam:   General: Appearance:    Obese male with periods of increasing agitation     Lungs:     Clear to auscultation bilaterally, respirations unlabored  Heart:    Normal heart rate. Normal rhythm. No murmurs, rubs, or gallops.   MS:    Neurologic:   Awake, alert, oriented x 3. No apparent focal neurological           defect.       Data Reviewed:   I have personally reviewed following labs and imaging studies:  Labs: Labs show the following:   Basic Metabolic Panel: Recent Labs  Lab 10/17/19 1533 10/17/19 1533 10/17/19 2312 10/18/19 0815 10/18/19 0815 10/19/19 0549 10/20/19 0712  NA 136  --   --  139  --  142 141  K 3.5   < >  --  3.8   < > 4.0 4.3  CL 103  --   --  106  --  111 107  CO2 19*  --   --  20*  --  19* 19*  GLUCOSE 264*  --   --  218*  --  150* 181*  BUN 97*  --   --  88*  --  85* 83*  CREATININE 5.74*  --  5.45* 5.32*  --  5.43* 5.13*  CALCIUM 7.3*  --   --  7.3*  --  7.7* 8.2*   < > = values in this interval not displayed.   GFR Estimated Creatinine Clearance: 23.6 mL/min (A) (by C-G formula based on SCr of 5.13 mg/dL (H)). Liver Function Tests: Recent Labs  Lab 10/18/19 0815  AST 11*  ALT 10  ALKPHOS 84  BILITOT 0.5  PROT 8.1  ALBUMIN 2.3*  No results for input(s):  LIPASE, AMYLASE in the last 168 hours. No results for input(s): AMMONIA in the last 168 hours. Coagulation profile No results for input(s): INR, PROTIME in the last 168 hours.  CBC: Recent Labs  Lab 10/17/19 1533 10/18/19 0815 10/19/19 0549 10/20/19 0712  WBC 9.7 6.4 6.1 6.2  HGB 9.7* 8.5* 8.4* 8.7*  HCT 31.3* 27.9* 28.1* 29.0*  MCV 82.6 85.1 86.2 87.9  PLT 442* 392 415* 417*   Cardiac Enzymes: No results for input(s): CKTOTAL, CKMB, CKMBINDEX, TROPONINI in the last 168 hours. BNP (last 3 results) No results for input(s): PROBNP in the last 8760 hours. CBG: Recent Labs  Lab 10/19/19 1103 10/19/19 1609 10/19/19 2112 10/20/19 0614 10/20/19 1110  GLUCAP 138* 229* 128* 140* 136*   D-Dimer: No results for input(s): DDIMER in the last 72 hours. Hgb A1c: Recent Labs    10/17/19 2312  HGBA1C 8.8*   Lipid Profile: No results for input(s): CHOL, HDL, LDLCALC, TRIG, CHOLHDL, LDLDIRECT in the last 72 hours. Thyroid function studies: No results for input(s): TSH, T4TOTAL, T3FREE, THYROIDAB in the last 72 hours.  Invalid input(s): FREET3 Anemia work up: No results for input(s): VITAMINB12, FOLATE, FERRITIN, TIBC, IRON, RETICCTPCT in the last 72 hours. Sepsis Labs: Recent Labs  Lab 10/17/19 1533 10/18/19 0815 10/19/19 0549 10/20/19 0712  WBC 9.7 6.4 6.1 6.2    Microbiology Recent Results (from the past 240 hour(s))  SARS Coronavirus 2 by RT PCR (hospital order, performed in [facility] hospital lab) Nasopharyngeal Nasopharyngeal Swab     Status: None   Collection Time: 10/17/19  6:49 PM   Specimen: Nasopharyngeal Swab  Result Value Ref Range Status   SARS Coronavirus 2 NEGATIVE NEGATIVE Final    Comment: (NOTE) SARS-CoV-2 target nucleic acids are NOT DETECTED.  The SARS-CoV-2 RNA is generally detectable in upper and lower respiratory specimens during the acute phase of infection. The lowest concentration of SARS-CoV-2 viral copies this assay can detect is  250 copies / mL. A negative result does not preclude SARS-CoV-2 infection and should not be used as the sole basis for treatment or other patient management decisions.  A negative result may occur with improper specimen collection / handling, submission of specimen other than nasopharyngeal swab, presence of viral mutation(s) within the areas targeted by this assay, and inadequate number of viral copies (<250 copies / mL). A negative result must be combined with clinical observations, patient history, and epidemiological information.  Fact Sheet for Patients:   StrictlyIdeas.no  Fact Sheet for Healthcare Providers: BankingDealers.co.za  This test is not yet approved or  cleared by the [place] FDA and has been authorized for detection and/or diagnosis of SARS-CoV-2 by FDA under an Emergency Use Authorization (EUA).  This EUA will remain in effect (meaning this test can be used) for the duration of the COVID-19 declaration under Section 564(b)(1) of the Act, 21 U.S.C. section 360bbb-3(b)(1), unless the authorization is terminated or revoked sooner.  Performed at [facility], [place] [address], [place], [place] [address]     Procedures and diagnostic studies:  No results found.  Medications:   . amLODipine  10 mg Oral Daily  . aspirin EC  81 mg Oral Daily  . collagenase   Topical Daily  . enoxaparin (LOVENOX) injection  30 mg Subcutaneous Q24H  . gabapentin  300 mg Oral Daily   And  . gabapentin  600 mg Oral QHS  . insulin aspart  0-20 Units Subcutaneous TID WC  .  insulin aspart  0-5 Units Subcutaneous QHS  . insulin glargine  10 Units Subcutaneous QHS  . multivitamin with minerals  1 tablet Oral Daily  . nutrition supplement (JUVEN)  1 packet Oral BID BM  . Ensure Max Protein  11 oz Oral TID  . sodium chloride flush  3 mL Intravenous Q12H  . torsemide  40 mg Oral BID  . vancomycin variable dose per unstable renal  function (pharmacist dosing)   Does not apply See admin instructions   Continuous Infusions: . cefTRIAXone (ROCEPHIN)  IV 2 g (10/19/19 2126)  . metronidazole 500 mg (10/20/19 0624)     LOS: 3 days   Geradine Girt  [facility]   How to contact the [facility] Attending or Consulting provider [place] or covering provider during after hours [place], for this patient?  1. Check the care team in [facility] and look for a) attending/consulting [facility] provider listed and b) the [facility] team listed 2. Log into [email or web address] and use [place]'s universal password to access. If you do not have the password, please contact the hospital operator. 3. Locate the [facility] provider you are looking for under [facility] and page to a number that you can be directly reached. 4. If you still have difficulty reaching the provider, please page the [facility] (Director on Call) for the Hospitalists listed on amion for assistance.  10/20/2019, 1:31 PM

## 2019-10-20 NOTE — TOC Progression Note (Addendum)
Transition of Care [facility]) - Progression Note    Patient Details  Name: Zachary Young MRN: [id number] Date of Birth: Aug 17, 1960  Transition of [facility]) CM/SW Contact  Rindy Kollman, Edson Snowball, RN Phone Number: 10/20/2019, 3:29 PM  Clinical Narrative:      Received a message patient's sister wanted to speak to [facility] in patient's room after 2 pm regarding post op care. NCM went to patient's room 1415 Sister [name] not in room. Patient consented for NCM to call [name] and provided two phone numbers. Called both and left messages.   Discussed with patient. Due to patient having a record and being on parole  , home health and SNF will not accept. Post op will ask [facility] at [facility] to see daily. Sister had mentioned patient does not have power or running water at his home. Patient confirmed.  Patient voiced understanding.   [place] returned call. Above explained to her and she voiced understanding. Patient cannot stay with [name] at discharge , due to her land lord will not allow patient to stay on property . [name] states there are no other family/friends patient can stay with.       Expected Discharge Plan and Services     Discharge Planning Services: CM Consult   Living arrangements for the past 2 months: Single Family Home                                       Social Determinants of Health (SDOH) Interventions    Readmission Risk Interventions No flowsheet data found.

## 2019-10-20 NOTE — H&P (View-Only) (Signed)
ORTHOPAEDIC CONSULTATION  REQUESTING PHYSICIAN: Geradine Girt, DO  Chief Complaint: chronic ulcer right foot  HPI: Zachary Young is a 59 y.o. male who presents with necrotic ulcer plantar lateral aspect right foot with exposed bone.  Patient states that he has been unable to obtain diabetic shoe wear.  He states he walks around in hospital socks for all activities of daily living and all weather conditions.  Past Medical History:  Diagnosis Date  . CHF (congestive heart failure) ([place])   . Diabetes mellitus without complication ([place])   . Peripheral edema   . Renal disorder    kidney disease   Past Surgical History:  Procedure Laterality Date  . TOE AMPUTATION Bilateral    due to osteomyelitis, all 5 each foot   Social History   Socioeconomic History  . Marital status: Single    Spouse name: Not on file  . Number of children: Not on file  . Years of education: Not on file  . Highest education level: Not on file  Occupational History  . Not on file  Tobacco Use  . Smoking status: Current Every Day Smoker    Packs/day: 0.50    Types: Cigarettes  . Smokeless tobacco: Never Used  Vaping Use  . Vaping Use: Never used  Substance and Sexual Activity  . Alcohol use: Not Currently  . Drug use: Yes    Types: Cocaine  . Sexual activity: Not on file  Other Topics Concern  . Not on file  Social History Narrative  . Not on file   Social Determinants of Health   Financial Resource Strain:   . Difficulty of Paying Living Expenses:   Food Insecurity:   . Worried About Charity fundraiser in the Last Year:   . Arboriculturist in the Last Year:   Transportation Needs:   . Film/video editor (Medical):   Marland Kitchen Lack of Transportation (Non-Medical):   Physical Activity:   . Days of Exercise per Week:   . Minutes of Exercise per Session:   Stress:   . Feeling of Stress :   Social Connections:   . Frequency of Communication with Friends and Family:   . Frequency of  Social Gatherings with Friends and Family:   . Attends Religious Services:   . Active Member of Clubs or Organizations:   . Attends Archivist Meetings:   Marland Kitchen Marital Status:    Family History  Problem Relation Age of Onset  . Cancer Mother    - negative except otherwise stated in the family history section Allergies  Allergen Reactions  . Bee Venom Anaphylaxis    Per pt, nearly died from bee sting as a child  . Propofol Other (See Comments)     Hallucinations  . Eggs Or Egg-Derived Products Nausea And Vomiting and Other (See Comments)    Stomach cramps  . Morphine Nausea And Vomiting  . Oxycodone Nausea And Vomiting   Prior to Admission medications   Medication Sig Start Date End Date Taking? Authorizing Provider  albuterol (VENTOLIN HFA) 108 (90 Base) MCG/ACT inhaler Inhale 1 puff into the lungs every 4 (four) hours as needed for wheezing or shortness of breath. 08/20/19  Yes Gladys Damme, MD  amLODipine (NORVASC) 10 MG tablet Take 1 tablet (10 mg total) by mouth daily. 08/21/19  Yes Gladys Damme, MD  aspirin EC 81 MG EC tablet Take 1 tablet (81 mg total) by mouth daily. 08/21/19  Yes Gladys Damme,  MD  ergocalciferol (VITAMIN D2) 1.25 MG (50000 UT) capsule Take 50,000 Units by mouth every Friday.  07/13/19  Yes [provider]  gabapentin (NEURONTIN) 300 MG capsule Take 300-600 mg by mouth See admin instructions. Take one capsule (300 mg) by mouth every morning and two capsules (600 mg) at night   Yes [provider]  insulin glargine (LANTUS) 100 UNIT/ML injection Inject 10 Units into the skin at bedtime.    Yes [provider]  insulin lispro (HUMALOG) 100 UNIT/ML injection Inject 4 Units into the skin 3 (three) times daily with meals.    Yes [provider]  oxyCODONE-acetaminophen (PERCOCET/ROXICET) 5-325 MG tablet Take by mouth every 6 (six) hours as needed for severe pain.   Yes [provider]  torsemide (DEMADEX) 20  MG tablet Take 4 tablets (80 mg total) by mouth 2 (two) times daily. Patient taking differently: Take 20 mg by mouth daily.  08/20/19  Yes Gladys Damme, MD  Insulin Pen Needle (PEN NEEDLES 31GX5/16") 31G X 8 MM MISC 1 each by Other route daily. 05/16/19 05/15/20  [provider]  Insulin Syringe-Needle U-100 (GLOBAL INJECT EASE INSULIN SYR) 30G X 1/2" 0.5 ML MISC 1 each by Other route 4 (four) times daily as needed. Insulin 07/13/19   [provider]  metoprolol succinate (TOPROL-XL) 100 MG 24 hr tablet Take 100 mg by mouth daily. Take with or immediately following a meal. Patient not taking: Reported on 10/17/2019    [provider]  potassium chloride 20 MEQ TBCR Take 40 mEq by mouth daily. Patient not taking: Reported on 10/17/2019 08/20/19 09/19/19  Eulis Foster, MD   VAS [place] ABI WITH/WO TBI  Result Date: 10/18/2019 LOWER EXTREMITY DOPPLER STUDY Indications: Ulceration, and cellulitis. High Risk Factors: Hypertension, Diabetes. Other Factors: Amputation of all 5 toes on bilateral feet, secondary to                osteomyelitis.  Limitations: Today's exam was limited due to venous interference. Comparison Study: No prior study on file Performing Technologist: Sharion Dove RVS  Examination Guidelines: A complete evaluation includes at minimum, Doppler waveform signals and systolic blood pressure reading at the level of bilateral brachial, anterior tibial, and posterior tibial arteries, when vessel segments are accessible. Bilateral testing is considered an integral part of a complete examination. Photoelectric Plethysmograph (PPG) waveforms and toe systolic pressure readings are included as required and additional duplex testing as needed. Limited examinations for reoccurring indications may be performed as noted.  ABI Findings: +---------+------------------+-----+-------------------+----------+ Right    Rt Pressure (mmHg)IndexWaveform           Comment     +---------+------------------+-----+-------------------+----------+ Brachial 151                    triphasic                     +---------+------------------+-----+-------------------+----------+ PTA      121               0.80 dampened monophasic           +---------+------------------+-----+-------------------+----------+ DP       104               0.69 dampened monophasic           +---------+------------------+-----+-------------------+----------+ Great Toe  amputation +---------+------------------+-----+-------------------+----------+ +---------+------------------+-----+-------------------+----------+ Left     Lt Pressure (mmHg)IndexWaveform           Comment    +---------+------------------+-----+-------------------+----------+ Brachial                        triphasic          IV         +---------+------------------+-----+-------------------+----------+ PTA      165               1.09 biphasic                      +---------+------------------+-----+-------------------+----------+ DP       44                0.29 dampened monophasic           +---------+------------------+-----+-------------------+----------+ Great Toe                                          amputation +---------+------------------+-----+-------------------+----------+ +-------+-----------+-----------+------------+------------+ ABI/TBIToday's ABIToday's TBIPrevious ABIPrevious TBI +-------+-----------+-----------+------------+------------+ Right  0.8                                            +-------+-----------+-----------+------------+------------+ Left   1.09                                           +-------+-----------+-----------+------------+------------+  Summary: Right: Resting right ankle-brachial index indicates mild right lower extremity arterial disease. ABIs are unreliable. Left: Resting left ankle-brachial index  is within normal range. No evidence of significant left lower extremity arterial disease. ABIs are unreliable.  *See table(s) above for measurements and observations.  Electronically signed by Monica Martinez MD on 10/18/2019 at 1:13:16 PM.    Final    VAS [place] LOWER EXTREMITY VENOUS (DVT)  Result Date: 10/18/2019  Lower Venous DVTStudy Indications: Swelling, Pain, and cellulitis.  Comparison Study: Prior study from 08/16/19 is available for comparison Performing Technologist: Sharion Dove RVS  Examination Guidelines: A complete evaluation includes B-mode imaging, spectral Doppler, color Doppler, and power Doppler as needed of all accessible portions of each vessel. Bilateral testing is considered an integral part of a complete examination. Limited examinations for reoccurring indications may be performed as noted. The reflux portion of the exam is performed with the patient in reverse Trendelenburg.  +---------+---------------+---------+-----------+----------+-------------------+ RIGHT    CompressibilityPhasicitySpontaneityPropertiesThrombus Aging      +---------+---------------+---------+-----------+----------+-------------------+ CFV      Full           Yes      Yes                                      +---------+---------------+---------+-----------+----------+-------------------+ SFJ      Full                                                             +---------+---------------+---------+-----------+----------+-------------------+  FV Prox  Full                                                             +---------+---------------+---------+-----------+----------+-------------------+ FV Mid   Full                                                             +---------+---------------+---------+-----------+----------+-------------------+ FV Distal                                             patent by color and                                                        Doppler             +---------+---------------+---------+-----------+----------+-------------------+ POP      Full           Yes      Yes                                      +---------+---------------+---------+-----------+----------+-------------------+ PTV      Full                                                             +---------+---------------+---------+-----------+----------+-------------------+ PERO                                                  Not visualized      +---------+---------------+---------+-----------+----------+-------------------+   +----+---------------+---------+-----------+----------+--------------+ LEFTCompressibilityPhasicitySpontaneityPropertiesThrombus Aging +----+---------------+---------+-----------+----------+--------------+ CFV Full           Yes      Yes                                 +----+---------------+---------+-----------+----------+--------------+     Summary: RIGHT: - Findings appear essentially unchanged compared to previous examination. - There is no evidence of deep vein thrombosis in the lower extremity. However, portions of this examination were limited- see technologist comments above.  - Ultrasound characteristics of enlarged lymph nodes are noted in the groin.  LEFT: - No evidence of common femoral vein obstruction. - Ultrasound characteristics of enlarged lymph nodes noted in the groin.  *See table(s) above for measurements and observations. Electronically signed by Monica Martinez MD on 10/18/2019 at 1:16:53 PM.    Final    - pertinent xrays, CT, MRI  studies were reviewed and independently interpreted  Positive ROS: All other systems have been reviewed and were otherwise negative with the exception of those mentioned in the HPI and as above.  Physical Exam: General: Alert, no acute distress Psychiatric: Patient is competent for consent with normal mood and affect Lymphatic: No axillary or cervical  lymphadenopathy Cardiovascular: No pedal edema Respiratory: No cyanosis, no use of accessory musculature GI: No organomegaly, abdomen is soft and non-tender    Images:  @ENCIMAGES @  Labs:  Lab Results  Component Value Date   HGBA1C 8.8 (H) 10/17/2019   HGBA1C 8.5 (H) 08/16/2019   HGBA1C 11.1 (H) 12/14/2018   ESRSEDRATE >140 (H) 10/17/2019   CRP 13.0 (H) 10/17/2019    Lab Results  Component Value Date   ALBUMIN 2.3 (L) 10/18/2019   ALBUMIN 2.3 (L) 08/30/2019   ALBUMIN 2.1 (L) 08/16/2019   PREALBUMIN 13.2 (L) 10/17/2019    Neurologic: Patient does not have protective sensation bilateral lower extremities.   MUSCULOSKELETAL:   Skin: Examination patient has a large necrotic ulcer over the plantar lateral aspect of the right foot there is exposed bone.  Radiograph shows destructive bony changes in the fifth metatarsal consistent with chronic osteomyelitis.  Patient has pitting edema in the right lower extremity up to the tibial tubercle.  I cannot palpate a pulse.  Ankle-brachial indices shows dampened monophasic flow with a dorsalis pedis ABI of 0.29.  Patient has hemoglobin A1c of 8.8 with an albumin of 2.3.  Patient is status post a short transmetatarsal amputation on the right and gastrocnemius recession at [place] by podiatry, and status post left TMA 2019  Assessment: Assessment: Uncontrolled type 2 diabetes with necrotic plantar ulcer right foot with osteomyelitis status post transmetatarsal amputation.  Plan: Plan: Discussed with the patient there are not any further foot salvage intervention options due to the short transmetatarsal amputation and with excision of the large necrotic ulcer and infected bone there are not any foot salvage intervention options.  Will need to proceed with a transtibial amputation we will plan for surgery tomorrow Wednesday.  Thank you for the consult and the opportunity to see Mr. Malissa Hippo, MD [facility]  [phone] 7:19 AM

## 2019-10-20 NOTE — Progress Notes (Signed)
Pharmacy Antibiotic Note  Zachary Young is a 59 y.o. male admitted on 10/17/2019 with diabetic foot wound.  Pharmacy has been consulted for Vancomycin dosing.  Also on Ceftriaxone and Metronidazole.   CKD4, Creatinine up from  ~4 in May.    Vancomycin 2250 mg IV loading dose given on 7/4 ~12am.  Random Vanc level today is 13 mcg/ml.  Had planned to re-dose when level < 15 mcg/ml.  Plan:  Vancomycin 1250 mg IV x 1 today.  Continues on Ceftriaxone 2 gm IV q24hrs + Metronidazole 500 mg IV qh8.  Noted plan for R BKA on 7/7.  Will follow up post-op antibiotic plan.  Height: 6\' 3"  (190.5 cm) Weight: (!) 136.2 kg (300 lb 4.8 oz) IBW/kg (Calculated) : 84.5  Temp (24hrs), Avg:98.1 F (36.7 C), Min:97.8 F (36.6 C), Max:98.6 F (37 C)  Recent Labs  Lab 10/17/19 1533 10/17/19 2312 10/18/19 0815 10/19/19 0549 10/20/19 0712  WBC 9.7  --  6.4 6.1 6.2  CREATININE 5.74* 5.45* 5.32* 5.43* 5.13*  VANCORANDOM  --   --   --   --  13    Estimated Creatinine Clearance: 23.6 mL/min (A) (by C-G formula based on SCr of 5.13 mg/dL (H)).    Allergies  Allergen Reactions  . Bee Venom Anaphylaxis    Per pt, nearly died from bee sting as a child  . Propofol Other (See Comments)     Hallucinations  . Eggs Or Egg-Derived Products Nausea And Vomiting and Other (See Comments)    Stomach cramps  . Morphine Nausea And Vomiting  . Oxycodone Nausea And Vomiting    Antimicrobials this admission: Vancomycin 7/4 >>  2250 mg IV x 1 dose given 7/4 @ 0009 Ceftriaxone 7/3 >> Metronidazole 7/3 >>  Dose adjustments this admission:   7/6: VR 13 mcg/ml > re-dose Vancomycin with 1250 mg IV x 1  Microbiology results:  7/3 COVID: negative  Thank you for allowing pharmacy to be a part of this patient's care.  Arty Baumgartner 10/20/2019 9:59 AM

## 2019-10-21 ENCOUNTER — Encounter (HOSPITAL_COMMUNITY)
Admission: EM | Disposition: A | Discharge status: Home / Self Care | Payer: Self-pay | Source: Home / Self Care | Attending: Internal Medicine

## 2019-10-21 ENCOUNTER — Inpatient Hospital Stay (HOSPITAL_COMMUNITY): Payer: Medicaid Other | Admitting: Anesthesiology

## 2019-10-21 ENCOUNTER — Encounter (HOSPITAL_COMMUNITY): Payer: Self-pay | Admitting: Internal Medicine

## 2019-10-21 DIAGNOSIS — R55 Syncope and collapse: Secondary | ICD-10-CM | POA: Diagnosis not present

## 2019-10-21 DIAGNOSIS — E872 Acidosis, unspecified: Secondary | ICD-10-CM | POA: Diagnosis present

## 2019-10-21 DIAGNOSIS — F17200 Nicotine dependence, unspecified, uncomplicated: Secondary | ICD-10-CM

## 2019-10-21 DIAGNOSIS — L02611 Cutaneous abscess of right foot: Secondary | ICD-10-CM

## 2019-10-21 DIAGNOSIS — E8809 Other disorders of plasma-protein metabolism, not elsewhere classified: Secondary | ICD-10-CM | POA: Diagnosis present

## 2019-10-21 DIAGNOSIS — E138 Other specified diabetes mellitus with unspecified complications: Secondary | ICD-10-CM

## 2019-10-21 DIAGNOSIS — D638 Anemia in other chronic diseases classified elsewhere: Secondary | ICD-10-CM | POA: Diagnosis present

## 2019-10-21 DIAGNOSIS — I5042 Chronic combined systolic (congestive) and diastolic (congestive) heart failure: Secondary | ICD-10-CM

## 2019-10-21 DIAGNOSIS — E7800 Pure hypercholesterolemia, unspecified: Secondary | ICD-10-CM

## 2019-10-21 DIAGNOSIS — R7982 Elevated C-reactive protein (CRP): Secondary | ICD-10-CM | POA: Diagnosis present

## 2019-10-21 DIAGNOSIS — E1365 Other specified diabetes mellitus with hyperglycemia: Secondary | ICD-10-CM

## 2019-10-21 DIAGNOSIS — E1140 Type 2 diabetes mellitus with diabetic neuropathy, unspecified: Secondary | ICD-10-CM | POA: Diagnosis present

## 2019-10-21 DIAGNOSIS — IMO0002 Reserved for concepts with insufficient information to code with codable children: Secondary | ICD-10-CM | POA: Diagnosis present

## 2019-10-21 DIAGNOSIS — R700 Elevated erythrocyte sedimentation rate: Secondary | ICD-10-CM | POA: Diagnosis present

## 2019-10-21 DIAGNOSIS — F1410 Cocaine abuse, uncomplicated: Secondary | ICD-10-CM | POA: Diagnosis not present

## 2019-10-21 HISTORY — PX: AMPUTATION: SHX166

## 2019-10-21 LAB — BASIC METABOLIC PANEL
Anion gap: 13 (ref 5–15)
BUN: 91 mg/dL — ABNORMAL HIGH (ref 6–20)
CO2: 23 mmol/L (ref 22–32)
Calcium: 8.9 mg/dL (ref 8.9–10.3)
Chloride: 104 mmol/L (ref 98–111)
Creatinine, Ser: 4.66 mg/dL — ABNORMAL HIGH (ref 0.61–1.24)
GFR calc Af Amer: 15 mL/min — ABNORMAL LOW (ref >60)
GFR calc non Af Amer: 13 mL/min — ABNORMAL LOW (ref >60)
Glucose, Bld: 179 mg/dL — ABNORMAL HIGH (ref 70–99)
Potassium: 4.3 mmol/L (ref 3.5–5.1)
Sodium: 140 mmol/L (ref 135–145)

## 2019-10-21 LAB — CBC
HCT: 28.3 % — ABNORMAL LOW (ref 39.0–52.0)
Hemoglobin: 8.5 g/dL — ABNORMAL LOW (ref 13.0–17.0)
MCH: 25.7 pg — ABNORMAL LOW (ref 26.0–34.0)
MCHC: 30 g/dL (ref 30.0–36.0)
MCV: 85.5 fL (ref 80.0–100.0)
Platelets: 435 10*3/uL — ABNORMAL HIGH (ref 150–400)
RBC: 3.31 MIL/uL — ABNORMAL LOW (ref 4.22–5.81)
RDW: 14.6 % (ref 11.5–15.5)
WBC: 5.5 10*3/uL (ref 4.0–10.5)
nRBC: 0 % (ref 0.0–0.2)

## 2019-10-21 LAB — GLUCOSE, CAPILLARY
Glucose-Capillary: 143 mg/dL — ABNORMAL HIGH (ref 70–99)
Glucose-Capillary: 136 mg/dL — ABNORMAL HIGH (ref 70–99)
Glucose-Capillary: 133 mg/dL — ABNORMAL HIGH (ref 70–99)
Glucose-Capillary: 297 mg/dL — ABNORMAL HIGH (ref 70–99)
Glucose-Capillary: 358 mg/dL — ABNORMAL HIGH (ref 70–99)

## 2019-10-21 SURGERY — AMPUTATION BELOW KNEE
Anesthesia: General | Site: Knee | Laterality: Right

## 2019-10-21 MED ORDER — FENTANYL CITRATE (PF) 100 MCG/2ML IJ SOLN
INTRAMUSCULAR | Status: AC
  Administered 2019-10-21: 50 ug via INTRAVENOUS
  Filled 2019-10-21: qty 2

## 2019-10-21 MED ORDER — ONDANSETRON HCL 4 MG/2ML IJ SOLN
INTRAMUSCULAR | Status: DC | PRN
  Administered 2019-10-21: 4 mg via INTRAVENOUS

## 2019-10-21 MED ORDER — MIDAZOLAM HCL 2 MG/2ML IJ SOLN
INTRAMUSCULAR | Status: AC
  Administered 2019-10-21: 1 mg via INTRAVENOUS
  Filled 2019-10-21: qty 2

## 2019-10-21 MED ORDER — CHLORHEXIDINE GLUCONATE 0.12 % MT SOLN
OROMUCOSAL | Status: AC
  Administered 2019-10-21: 15 mL via OROMUCOSAL
  Filled 2019-10-21: qty 15

## 2019-10-21 MED ORDER — DOCUSATE SODIUM 100 MG PO CAPS
100.0000 mg | ORAL_CAPSULE | Freq: Two times a day (BID) | ORAL | Status: DC
  Administered 2019-10-21 – 2019-11-17 (×18): 100 mg via ORAL
  Filled 2019-10-21 (×47): qty 1

## 2019-10-21 MED ORDER — LACTATED RINGERS IV SOLN: INTRAVENOUS | Status: DC | PRN

## 2019-10-21 MED ORDER — ONDANSETRON HCL 4 MG/2ML IJ SOLN
INTRAMUSCULAR | Status: AC
  Filled 2019-10-21: qty 2

## 2019-10-21 MED ORDER — MIDAZOLAM HCL 2 MG/2ML IJ SOLN: 1.0000 mg | Freq: Once | INTRAMUSCULAR | Status: AC

## 2019-10-21 MED ORDER — ORAL CARE MOUTH RINSE: 15.0000 mL | Freq: Once | OROMUCOSAL | Status: AC

## 2019-10-21 MED ORDER — FENTANYL CITRATE (PF) 100 MCG/2ML IJ SOLN: 50.0000 ug | Freq: Once | INTRAMUSCULAR | Status: AC

## 2019-10-21 MED ORDER — FENTANYL CITRATE (PF) 250 MCG/5ML IJ SOLN
INTRAMUSCULAR | Status: AC
  Filled 2019-10-21: qty 5

## 2019-10-21 MED ORDER — BUPIVACAINE HCL (PF) 0.25 % IJ SOLN
INTRAMUSCULAR | Status: DC | PRN
  Administered 2019-10-21: 20 mL

## 2019-10-21 MED ORDER — PHENYLEPHRINE HCL (PRESSORS) 10 MG/ML IV SOLN
INTRAVENOUS | Status: DC | PRN
Start: 2019-10-21 — End: 2019-10-21
  Administered 2019-10-21: 120 ug via INTRAVENOUS
  Administered 2019-10-21: 160 ug via INTRAVENOUS

## 2019-10-21 MED ORDER — 0.9 % SODIUM CHLORIDE (POUR BTL) OPTIME
TOPICAL | Status: DC | PRN
  Administered 2019-10-21: 1000 mL

## 2019-10-21 MED ORDER — LIDOCAINE HCL (CARDIAC) PF 100 MG/5ML IV SOSY
PREFILLED_SYRINGE | INTRAVENOUS | Status: DC | PRN
  Administered 2019-10-21: 40 mg via INTRAVENOUS

## 2019-10-21 MED ORDER — DEXAMETHASONE SODIUM PHOSPHATE 10 MG/ML IJ SOLN
INTRAMUSCULAR | Status: DC | PRN
Start: 2019-10-21 — End: 2019-10-21
  Administered 2019-10-21: 4 mg via INTRAVENOUS

## 2019-10-21 MED ORDER — EPHEDRINE SULFATE 50 MG/ML IJ SOLN
INTRAMUSCULAR | Status: DC | PRN
Start: 2019-10-21 — End: 2019-10-21
  Administered 2019-10-21: 5 mg via INTRAVENOUS

## 2019-10-21 MED ORDER — FENTANYL CITRATE (PF) 100 MCG/2ML IJ SOLN: 25.0000 ug | INTRAMUSCULAR | Status: DC | PRN

## 2019-10-21 MED ORDER — SODIUM CHLORIDE 0.9 % IV SOLN: INTRAVENOUS | Status: DC

## 2019-10-21 MED ORDER — ROPIVACAINE HCL 5 MG/ML IJ SOLN
INTRAMUSCULAR | Status: DC | PRN
Start: 2019-10-21 — End: 2019-10-21
  Administered 2019-10-21: 20 mL via PERINEURAL

## 2019-10-21 MED ORDER — MIDAZOLAM HCL 2 MG/2ML IJ SOLN
INTRAMUSCULAR | Status: AC
  Filled 2019-10-21: qty 2

## 2019-10-21 MED ORDER — EPHEDRINE 5 MG/ML INJ
INTRAVENOUS | Status: AC
  Filled 2019-10-21: qty 10

## 2019-10-21 MED ORDER — PHENYLEPHRINE 40 MCG/ML (10ML) SYRINGE FOR IV PUSH (FOR BLOOD PRESSURE SUPPORT)
PREFILLED_SYRINGE | INTRAVENOUS | Status: AC
  Filled 2019-10-21: qty 10

## 2019-10-21 MED ORDER — METOCLOPRAMIDE HCL 5 MG/ML IJ SOLN
5.0000 mg | Freq: Three times a day (TID) | INTRAMUSCULAR | Status: DC | PRN
  Administered 2019-11-01: 5 mg via INTRAVENOUS
  Filled 2019-10-21: qty 2

## 2019-10-21 MED ORDER — METOCLOPRAMIDE HCL 5 MG PO TABS: 5.0000 mg | ORAL_TABLET | Freq: Three times a day (TID) | ORAL | Status: DC | PRN

## 2019-10-21 MED ORDER — DEXAMETHASONE SODIUM PHOSPHATE 10 MG/ML IJ SOLN
INTRAMUSCULAR | Status: AC
  Filled 2019-10-21: qty 1

## 2019-10-21 MED ORDER — PROPOFOL 10 MG/ML IV BOLUS
INTRAVENOUS | Status: AC
  Filled 2019-10-21: qty 20

## 2019-10-21 MED ORDER — ONDANSETRON HCL 4 MG/2ML IJ SOLN: 4.0000 mg | Freq: Four times a day (QID) | INTRAMUSCULAR | Status: DC | PRN

## 2019-10-21 MED ORDER — ETOMIDATE 2 MG/ML IV SOLN
INTRAVENOUS | Status: DC | PRN
Start: 2019-10-21 — End: 2019-10-21
  Administered 2019-10-21: 20 mg via INTRAVENOUS

## 2019-10-21 MED ORDER — CHLORHEXIDINE GLUCONATE 0.12 % MT SOLN: 15.0000 mL | Freq: Once | OROMUCOSAL | Status: AC

## 2019-10-21 MED ORDER — BUPIVACAINE LIPOSOME 1.3 % IJ SUSP
INTRAMUSCULAR | Status: DC | PRN
  Administered 2019-10-21: 10 mL via PERINEURAL

## 2019-10-21 SURGICAL SUPPLY — 38 items
BLADE SAW RECIP 87.9 MT (BLADE) ×3 IMPLANT
BLADE SURG 21 STRL SS (BLADE) ×3 IMPLANT
BNDG COHESIVE 6X5 TAN STRL LF (GAUZE/BANDAGES/DRESSINGS) IMPLANT
CANISTER WOUND CARE 500ML ATS (WOUND CARE) ×3 IMPLANT
COVER SURGICAL LIGHT HANDLE (MISCELLANEOUS) ×3 IMPLANT
COVER WAND RF STERILE (DRAPES) IMPLANT
CUFF TOURN SGL QUICK 34 (TOURNIQUET CUFF) ×2
CUFF TRNQT CYL 34X4.125X (TOURNIQUET CUFF) ×1 IMPLANT
DRAPE INCISE IOBAN 66X45 STRL (DRAPES) ×3 IMPLANT
DRAPE U-SHAPE 47X51 STRL (DRAPES) ×3 IMPLANT
DRESSING PREVENA PLUS CUSTOM (GAUZE/BANDAGES/DRESSINGS) ×1 IMPLANT
DRSG PREVENA PLUS CUSTOM (GAUZE/BANDAGES/DRESSINGS) ×3
DURAPREP 26ML APPLICATOR (WOUND CARE) ×3 IMPLANT
ELECT REM PT RETURN 9FT ADLT (ELECTROSURGICAL) ×3
ELECTRODE REM PT RTRN 9FT ADLT (ELECTROSURGICAL) ×1 IMPLANT
GLOVE BIOGEL PI IND STRL 9 (GLOVE) ×1 IMPLANT
GLOVE BIOGEL PI INDICATOR 9 (GLOVE) ×2
GLOVE SURG ORTHO 9.0 STRL STRW (GLOVE) ×3 IMPLANT
GOWN STRL REUS W/ TWL XL LVL3 (GOWN DISPOSABLE) ×2 IMPLANT
GOWN STRL REUS W/TWL XL LVL3 (GOWN DISPOSABLE) ×4
KIT BASIN OR (CUSTOM PROCEDURE TRAY) ×3 IMPLANT
KIT TURNOVER KIT B (KITS) ×3 IMPLANT
MANIFOLD NEPTUNE II (INSTRUMENTS) ×3 IMPLANT
NS IRRIG 1000ML POUR BTL (IV SOLUTION) ×3 IMPLANT
PACK ORTHO EXTREMITY (CUSTOM PROCEDURE TRAY) ×3 IMPLANT
PAD ARMBOARD 7.5X6 YLW CONV (MISCELLANEOUS) ×3 IMPLANT
PREVENA RESTOR ARTHOFORM 46X30 (CANNISTER) ×3 IMPLANT
SPONGE LAP 18X18 RF (DISPOSABLE) IMPLANT
STAPLER VISISTAT 35W (STAPLE) IMPLANT
STOCKINETTE IMPERVIOUS LG (DRAPES) ×3 IMPLANT
SUT ETHILON 2 0 PSLX (SUTURE) IMPLANT
SUT SILK 2 0 (SUTURE) ×2
SUT SILK 2-0 18XBRD TIE 12 (SUTURE) ×1 IMPLANT
SUT VIC AB 1 CTX 27 (SUTURE) ×6 IMPLANT
TOWEL GREEN STERILE (TOWEL DISPOSABLE) ×3 IMPLANT
TUBE CONNECTING 12'X1/4 (SUCTIONS) ×1
TUBE CONNECTING 12X1/4 (SUCTIONS) ×2 IMPLANT
YANKAUER SUCT BULB TIP NO VENT (SUCTIONS) ×3 IMPLANT

## 2019-10-21 NOTE — Progress Notes (Signed)
Orthopedic Tech Progress Note Patient Details:  Zachary Young 03-04-1961 [id number] Called in order to [facility] for an VIVE PROTOCOL BK Patient ID: Zachary Young, male   DOB: 12-Jan-1961, 59 y.o.   MRN: [id number]   Zachary Young 10/21/2019, 12:31 PM

## 2019-10-21 NOTE — Progress Notes (Signed)
Pt requested help with the restroom. RN helped pt to the edge of bed to pee Pt refused RN to stay in the room. Call bell in reach RN outside the door

## 2019-10-21 NOTE — Anesthesia Procedure Notes (Signed)
Anesthesia Regional Block: Popliteal block   Pre-Anesthetic Checklist: ,, timeout performed, Correct Patient, Correct Site, Correct Laterality, Correct Procedure, Correct Position, site marked, Risks and benefits discussed,  Surgical consent,  Pre-op evaluation,  At surgeon's request and post-op pain management  Laterality: Right  Prep: chloraprep       Needles:  Injection technique: Single-shot  Needle Type: Echogenic Stimulator Needle          Additional Needles:   Procedures:, nerve stimulator,,,,,,,   Nerve Stimulator or Paresthesia:  Response: plantar flexion of foot, 0.45 mA,   Additional Responses:   Narrative:  Start time: 10/21/2019 9:25 AM End time: 10/21/2019 9:33 AM Injection made incrementally with aspirations every 5 mL.  Performed by: Personally  Anesthesiologist: Albertha Ghee, MD  Additional Notes: Functioning IV was confirmed and monitors were applied.  A 50mm 21ga Arrow echogenic stimulator needle was used. Sterile prep and drape,hand hygiene and sterile gloves were used.  Negative aspiration and negative test dose prior to incremental administration of local anesthetic. The patient tolerated the procedure well.  Ultrasound guidance: relevent anatomy identified, needle position confirmed, local anesthetic spread visualized around nerve(s), vascular puncture avoided.  Image printed for medical record.

## 2019-10-21 NOTE — Anesthesia Procedure Notes (Signed)
Procedure Name: LMA Insertion Date/Time: 10/21/2019 10:10 AM Performed by: Inda Coke, CRNA Pre-anesthesia Checklist: Patient identified, Emergency Drugs available, Suction available and Patient being monitored Patient Re-evaluated:Patient Re-evaluated prior to induction Oxygen Delivery Method: Circle System Utilized Preoxygenation: Pre-oxygenation with 100% oxygen Induction Type: IV induction Ventilation: Mask ventilation without difficulty LMA: LMA inserted LMA Size: 5.0 Number of attempts: 1 Airway Equipment and Method: Bite block Placement Confirmation: positive ETCO2 Tube secured with: Tape Dental Injury: Teeth and Oropharynx as per pre-operative assessment

## 2019-10-21 NOTE — Interval H&P Note (Signed)
History and Physical Interval Note:  10/21/2019 6:51 AM  Zachary Young  has presented today for surgery, with the diagnosis of Right Foot Osteomyelitis.  The various methods of treatment have been discussed with the patient and family. After consideration of risks, benefits and other options for treatment, the patient has consented to  Procedure(s): RIGHT BELOW KNEE AMPUTATION (Right) as a surgical intervention.  The patient's history has been reviewed, patient examined, no change in status, stable for surgery.  I have reviewed the patient's chart and labs.  Questions were answered to the patient's satisfaction.     Newt Minion

## 2019-10-21 NOTE — Op Note (Signed)
   Date of Surgery: 10/21/2019  INDICATIONS: Zachary Young is a 59 y.o.-year-old male who with abscess and osteomyelitis of the right foot with venous insufficiency and lymphatic insufficiency with edema and swelling right lower extremity.  PREOPERATIVE DIAGNOSIS: Abscess and osteomyelitis right foot with venous and lymphatic insufficiency  POSTOPERATIVE DIAGNOSIS: Same.  PROCEDURE: Transtibial amputation Application of Prevena wound VAC  SURGEON: Sharol Given, M.D.  ANESTHESIA:  general  IV FLUIDS AND URINE: See anesthesia.  ESTIMATED BLOOD LOSS: Minimal mL.  COMPLICATIONS: None.  DESCRIPTION OF PROCEDURE: The patient was brought to the operating room and underwent a general anesthetic. After adequate levels of anesthesia were obtained patient's lower extremity was prepped using DuraPrep draped into a sterile field. A timeout was called. The foot was draped out of the sterile field with impervious stockinette. A transverse incision was made 11 cm distal to the tibial tubercle. This curved proximally and a large posterior flap was created. The tibia was transected 1 cm proximal to the skin incision. The fibula was transected just proximal to the tibial incision. The tibia was beveled anteriorly. A large posterior flap was created. The sciatic nerve was pulled cut and allowed to retract. The vascular bundles were suture ligated with 2-0 silk. The deep and superficial fascial layers were closed using #1 Vicryl. The skin was closed using staples and 2-0 nylon. The wound was covered with a Prevena wound VAC. There was a good suction fit. A prosthetic shrinker was applied. Patient was extubated taken to the PACU in stable condition.   DISCHARGE PLANNING:  Antibiotic duration: 24 hours postoperatively  Weightbearing: Nonweightbearing on the right  Pain medication: Opioid pathway  Dressing care/ Wound VAC: Continue wound VAC for 1 week after discharge.  Discharge to: Discharge based on physical  therapy for home with home health physical therapy inpatient rehab for skilled nursing facility.  Follow-up: In the office 1 week post operative.  Zachary Score, MD [place] 11:07 AM

## 2019-10-21 NOTE — Progress Notes (Signed)
Patient transported back to room, receiving RN at bedside, VS stable, patient called and spoke with family.  Rowe Pavy, RN

## 2019-10-21 NOTE — Anesthesia Procedure Notes (Signed)
Anesthesia Regional Block: Adductor canal block   Pre-Anesthetic Checklist: ,, timeout performed, Correct Patient, Correct Site, Correct Laterality, Correct Procedure, Correct Position, site marked, Risks and benefits discussed,  Surgical consent,  Pre-op evaluation,  At surgeon's request and post-op pain management  Laterality: Right  Prep: chloraprep       Needles:  Injection technique: Single-shot  Needle Type: Echogenic Needle     Needle Length: 9cm  Needle Gauge: 21     Additional Needles:   Narrative:  Start time: 10/21/2019 9:32 AM End time: 10/21/2019 9:40 AM Injection made incrementally with aspirations every 5 mL.  Performed by: Personally  Anesthesiologist: Albertha Ghee, MD  Additional Notes: Pt tolerated the procedure well.

## 2019-10-21 NOTE — Anesthesia Preprocedure Evaluation (Signed)
Anesthesia Evaluation  Patient identified by MRN, date of birth, ID band Patient awake    Reviewed: Allergy & Precautions, H&P , NPO status , Patient's Chart, lab work & pertinent test results  Airway Mallampati: II   Neck ROM: full    Dental   Pulmonary Current Smoker,    breath sounds clear to auscultation       Cardiovascular hypertension, +CHF   Rhythm:regular Rate:Normal     Neuro/Psych    GI/Hepatic   Endo/Other  diabetes, Type 2obese  Renal/GU Renal InsufficiencyRenal disease     Musculoskeletal   Abdominal   Peds  Hematology  (+) anemia ,   Anesthesia Other Findings   Reproductive/Obstetrics                             Anesthesia Physical Anesthesia Plan  ASA: III  Anesthesia Plan: General   Post-op Pain Management:  Regional for Post-op pain   Induction: Intravenous  PONV Risk Score and Plan: 1 and Ondansetron, Dexamethasone, Midazolam and Treatment may vary due to age or medical condition  Airway Management Planned: LMA  Additional Equipment:   Intra-op Plan:   Post-operative Plan:   Informed Consent: I have reviewed the patients History and Physical, chart, labs and discussed the procedure including the risks, benefits and alternatives for the proposed anesthesia with the patient or authorized representative who has indicated his/her understanding and acceptance.       Plan Discussed with: CRNA, Anesthesiologist and Surgeon  Anesthesia Plan Comments:         Anesthesia Quick Evaluation

## 2019-10-21 NOTE — Anesthesia Postprocedure Evaluation (Signed)
Anesthesia Post Note  Patient: Shadrick Senne  Procedure(s) Performed: RIGHT BELOW KNEE AMPUTATION (Right Knee)     Patient location during evaluation: PACU Anesthesia Type: General Level of consciousness: awake and alert Pain management: pain level controlled Vital Signs Assessment: post-procedure vital signs reviewed and stable Respiratory status: spontaneous breathing, nonlabored ventilation, respiratory function stable and patient connected to nasal cannula oxygen Cardiovascular status: blood pressure returned to baseline and stable Postop Assessment: no apparent nausea or vomiting Anesthetic complications: no   No complications documented.  Last Vitals:  Vitals:   10/21/19 1120 10/21/19 1135  BP: (!) 166/91 (!) 172/88  Pulse: 71 72  Resp: (!) 24 20  Temp: 36.5 C 36.5 C  SpO2: 98% 100%    Last Pain:  Vitals:   10/21/19 1135  TempSrc: Oral  PainSc: 0-No pain                 Trinnity Breunig S

## 2019-10-21 NOTE — Progress Notes (Signed)
PROGRESS NOTE    Zachary Young  VOP:[id number] DOB: 1960-04-30 DOA: 10/17/2019 PCP: System, Pcp Not In     Brief Narrative:   59 y.o. BM PMHx CKD stage V, DM type II controlled with complication, Diabetic Neuropathy, Chronic systolic and diastolic CHF, HTN,   Presents  with pain who presented after passing out. Zachary Young notes that today, he went out on to his front porch to get some fresh air. Once out there, he became dizzy and had to lean against the house, and then he passed out. He slid down the side of the house and did not hit his head. He noted no chest pain, no fever, no chills, no change in urination. He does note that he has had a decreased appetite for the last 3-4 days, eating only about half of what he normally does. He has passed out before in the past when his sugar drops, and he thinks this may have happened today. He denies chest pain. He has SOB with ambulating at times, but nothing today. He report no change in urination and feels like he is urinating the normal amount. He reports taking torsemide 80mg  in the morning and evening (4 tablets) and not missing doses. He was having issues with pain in bed, and as I looked at his legs, I noted that his right leg was swollen, warm and mildly erythematous compared to the left. When asked, he noted a new wound on the plantar surface, at the 4th and 5th digit which he noticed first about 2 weeks ago.    Subjective: A/O x4, pain controlled.  Requesting he be allowed to start eating.   Assessment & Plan: Covid vaccination; positive vaccination   Active Problems:   CHF (congestive heart failure) (HCC)   Chronic kidney disease, stage 4 (severe) (HCC)   HLD (hyperlipidemia)   HTN (hypertension)   Tobacco use disorder   Type 2 diabetes mellitus with circulatory disorder, with long-term current use of insulin (HCC)   Wound of right foot   Cocaine abuse ([place])   Subacute osteomyelitis, right ankle and foot  ([place])   RIGHT foot wound -Plan is for Dr. Meridee Score to perform RIGHT BKA on 7/7 -7/7 s/p RIGHT transtibial amputation -Continue antibiotics per surgery wishes.   DM type II uncontrolled with complication -7/3 hemoglobin A1c=8.8 -7/7 consult diabetic coordinator poorly controlled diabetic reeducate patient on diabetes. 7/7 consult diabetic nutrition;poorly controlled diabetic reeducate patient on diabetes.  Syncope and collapse -likely multifactorial including overdiuresis, acute on chronic kidney disease and acute infection - refused telemetry - TTE from May was very difficult given habitus  Chronic diastolic and systolic CHF -EF 50 to 63% by echocardiogram 08/16/2019 -Strict ins and out -Daily weight -Amlodipine 10 mg daily -Torsemide 40 mg BID  HTN (hypertension) - Continue amlodipine, torsemide as noted above  Acute onChronic kidney disease, stage 4 (baseline CR = 3.7---> 4.7) Recent Labs  Lab 10/17/19 2312 10/18/19 0815 10/19/19 0549 10/20/19 0712 10/21/19 0459  CREATININE 5.45* 5.32* 5.43* 5.13* 4.66*  - Trend renal function -would not be a good/compliant candidate for HD, will consult nephrology in [place] and allow them to concur that patient is extremely poor candidate for HD.  Normocytic anemia? Recent Labs  Lab 10/17/19 1533 10/18/19 0815 10/19/19 0549 10/20/19 0712 10/21/19 0459  HGB 9.7* 8.5* 8.4* 8.7* 8.5*  -Anemia panel pending -Occult blood pending -Transfuse for hemoglobin<7  HLD (hyperlipidemia) -5/2 LDL= 50  Tobacco use disorder - Counseling prior to discharge  Cocaine  abuse -7/4 urine drug screen positive for cocaine -Patient counseled on cessation  Obese -Body mass index is 37.53 kg/m. -See uncontrolled DM type II   Goals of care -7/7 consult to palliative care;-with history of drug abuse, HTN, uncontrolled diabetes poor candidate for hemodialysis but is a CKD stage V patient.  Discuss changing status to DNR, home  palliative care    DVT prophylaxis: Lovenox Code Status: Full Family Communication: 7/7 family at bedside for discussion of plan of care Status is: Inpatient    Dispo: The patient is from: Home              Anticipated d/c is to: SNF              Anticipated d/c date is: Per surgery              Patient currently unstable      Consultants:  Dr. Meridee Score [facility] orthopedic surgery    Procedures/Significant Events:  7/7 RIGHT Transtibial amputation Application of Prevena wound VAC    I have personally reviewed and interpreted all radiology studies and my findings are as above.  VENTILATOR SETTINGS:    Cultures 7/3 SARS coronavirus negative    Antimicrobials: Anti-infectives (From admission, onward)   Start     Ordered Stop   10/21/19 0600  ceFAZolin (ANCEF) 3 g in dextrose 5 % 50 mL IVPB        10/20/19 1810 10/21/19 1013   10/20/19 1000  vancomycin (VANCOREADY) IVPB 1250 mg/250 mL        10/20/19 0930 10/20/19 1143   10/17/19 2115  cefTRIAXone (ROCEPHIN) 2 g in sodium chloride 0.9 % 100 mL IVPB     Discontinue     10/17/19 2112     10/17/19 2115  metroNIDAZOLE (FLAGYL) IVPB 500 mg     Discontinue     10/17/19 2112     10/17/19 2115  vancomycin (VANCOCIN) 2,250 mg in sodium chloride 0.9 % 500 mL IVPB        10/17/19 2110 10/18/19 0209   10/17/19 2110  vancomycin variable dose per unstable renal function (pharmacist dosing)     Discontinue     10/17/19 2110         Devices    LINES / TUBES:      Continuous Infusions: . sodium chloride 10 mL/hr at 10/21/19 0928  .  ceFAZolin (ANCEF) IV    . [MAR Hold] cefTRIAXone (ROCEPHIN)  IV 2 g (10/20/19 2204)  . [MAR Hold] metronidazole 500 mg (10/21/19 0621)     Objective: Vitals:   10/20/19 1756 10/21/19 0000 10/21/19 0623 10/21/19 0922  BP: 137/80 (!) 156/80 140/78   Pulse: 69 85 73   Resp: 18 18 16    Temp: 97.7 F (36.5 C) 98.6 F (37 C) 98.5 F (36.9 C)   TempSrc: Oral Oral Oral   SpO2:  100% 97% 99%   Weight:    (!) 136.2 kg  Height:    6\' 3"  (1.905 m)    Intake/Output Summary (Last 24 hours) at 10/21/2019 0934 Last data filed at 10/21/2019 0303 Gross per 24 hour  Intake 1715.47 ml  Output --  Net 1715.47 ml   Filed Weights   10/19/19 0546 10/20/19 0548 10/21/19 0922  Weight: 133.2 kg (!) 136.2 kg (!) 136.2 kg    Examination:  General A/O x4, No acute respiratory distress Eyes: negative scleral hemorrhage, negative anisocoria, negative icterus ENT: Negative Runny nose, negative gingival bleeding, Neck:  Negative scars, masses, torticollis, lymphadenopathy, JVD Lungs: Clear to auscultation bilaterally without wheezes or crackles Cardiovascular: Regular rate and rhythm without murmur gallop or rub normal S1 and S2 Abdomen: OBESE, negative abdominal pain, nondistended, positive soft, bowel sounds, no rebound, no ascites, no appreciable mass Extremities: LEFT transtarsal amputation well-healed.  RIGHT transpartial amputation covered and clean Ace bandage with drain.  Drain currently without any output. Skin: Negative rashes, lesions, ulcers Psychiatric:  Negative depression, negative anxiety, negative fatigue, negative mania  Central nervous system:  Cranial nerves II through XII intact, tongue/uvula midline, all extremities muscle strength 5/5, sensation intact throughout, negative dysarthria, negative expressive aphasia, negative receptive aphasia.  .     Data Reviewed: Care during the described time interval was provided by me .  I have reviewed this patient's available data, including medical history, events of note, physical examination, and all test results as part of my evaluation.  CBC: Recent Labs  Lab 10/17/19 1533 10/18/19 0815 10/19/19 0549 10/20/19 0712 10/21/19 0459  WBC 9.7 6.4 6.1 6.2 5.5  HGB 9.7* 8.5* 8.4* 8.7* 8.5*  HCT 31.3* 27.9* 28.1* 29.0* 28.3*  MCV 82.6 85.1 86.2 87.9 85.5  PLT 442* 392 415* 417* 154*   Basic Metabolic  Panel: Recent Labs  Lab 10/17/19 1533 10/17/19 1533 10/17/19 2312 10/18/19 0815 10/19/19 0549 10/20/19 0712 10/21/19 0459  NA 136  --   --  139 142 141 140  K 3.5  --   --  3.8 4.0 4.3 4.3  CL 103  --   --  106 111 107 104  CO2 19*  --   --  20* 19* 19* 23  GLUCOSE 264*  --   --  218* 150* 181* 179*  BUN 97*  --   --  88* 85* 83* 91*  CREATININE 5.74*   < > 5.45* 5.32* 5.43* 5.13* 4.66*  CALCIUM 7.3*  --   --  7.3* 7.7* 8.2* 8.9   < > = values in this interval not displayed.   GFR: Estimated Creatinine Clearance: 26 mL/min (A) (by C-G formula based on SCr of 4.66 mg/dL (H)). Liver Function Tests: Recent Labs  Lab 10/18/19 0815  AST 11*  ALT 10  ALKPHOS 84  BILITOT 0.5  PROT 8.1  ALBUMIN 2.3*   No results for input(s): LIPASE, AMYLASE in the last 168 hours. No results for input(s): AMMONIA in the last 168 hours. Coagulation Profile: No results for input(s): INR, PROTIME in the last 168 hours. Cardiac Enzymes: No results for input(s): CKTOTAL, CKMB, CKMBINDEX, TROPONINI in the last 168 hours. BNP (last 3 results) No results for input(s): PROBNP in the last 8760 hours. HbA1C: No results for input(s): HGBA1C in the last 72 hours. CBG: Recent Labs  Lab 10/20/19 1110 10/20/19 1611 10/20/19 2138 10/21/19 0617 10/21/19 0931  GLUCAP 136* 274* 180* 143* 136*   Lipid Profile: No results for input(s): CHOL, HDL, LDLCALC, TRIG, CHOLHDL, LDLDIRECT in the last 72 hours. Thyroid Function Tests: No results for input(s): TSH, T4TOTAL, FREET4, T3FREE, THYROIDAB in the last 72 hours. Anemia Panel: No results for input(s): VITAMINB12, FOLATE, FERRITIN, TIBC, IRON, RETICCTPCT in the last 72 hours. Sepsis Labs: No results for input(s): PROCALCITON, LATICACIDVEN in the last 168 hours.  Recent Results (from the past 240 hour(s))  SARS Coronavirus 2 by RT PCR (hospital order, performed in [facility] hospital lab) Nasopharyngeal Nasopharyngeal Swab     Status: None   Collection  Time: 10/17/19  6:49 PM   Specimen: Nasopharyngeal  Swab  Result Value Ref Range Status   SARS Coronavirus 2 NEGATIVE NEGATIVE Final    Comment: (NOTE) SARS-CoV-2 target nucleic acids are NOT DETECTED.  The SARS-CoV-2 RNA is generally detectable in upper and lower respiratory specimens during the acute phase of infection. The lowest concentration of SARS-CoV-2 viral copies this assay can detect is 250 copies / mL. A negative result does not preclude SARS-CoV-2 infection and should not be used as the sole basis for treatment or other patient management decisions.  A negative result may occur with improper specimen collection / handling, submission of specimen other than nasopharyngeal swab, presence of viral mutation(s) within the areas targeted by this assay, and inadequate number of viral copies (<250 copies / mL). A negative result must be combined with clinical observations, patient history, and epidemiological information.  Fact Sheet for Patients:   StrictlyIdeas.no  Fact Sheet for Healthcare Providers: BankingDealers.co.za  This test is not yet approved or  cleared by the [place] FDA and has been authorized for detection and/or diagnosis of SARS-CoV-2 by FDA under an Emergency Use Authorization (EUA).  This EUA will remain in effect (meaning this test can be used) for the duration of the COVID-19 declaration under Section 564(b)(1) of the Act, 21 U.S.C. section 360bbb-3(b)(1), unless the authorization is terminated or revoked sooner.  Performed at [facility], [place] [address], [place], [place] [address]          Radiology Studies: No results found.      Scheduled Meds: . [MAR Hold] amLODipine  10 mg Oral Daily  . [MAR Hold] aspirin EC  81 mg Oral Daily  . [MAR Hold] collagenase   Topical Daily  . [MAR Hold] enoxaparin (LOVENOX) injection  30 mg Subcutaneous Q24H  . fentaNYL      . [MAR Hold]  gabapentin  300 mg Oral Daily   And  . [MAR Hold] gabapentin  600 mg Oral QHS  . [MAR Hold] insulin aspart  0-20 Units Subcutaneous TID WC  . [MAR Hold] insulin aspart  0-5 Units Subcutaneous QHS  . [MAR Hold] insulin glargine  10 Units Subcutaneous QHS  . midazolam      . [MAR Hold] multivitamin with minerals  1 tablet Oral Daily  . [MAR Hold] nutrition supplement (JUVEN)  1 packet Oral BID BM  . [MAR Hold] Ensure Max Protein  11 oz Oral TID  . [MAR Hold] sodium chloride flush  3 mL Intravenous Q12H  . [MAR Hold] torsemide  40 mg Oral BID  . [MAR Hold] vancomycin variable dose per unstable renal function (pharmacist dosing)   Does not apply See admin instructions   Continuous Infusions: . sodium chloride 10 mL/hr at 10/21/19 0928  .  ceFAZolin (ANCEF) IV    . [MAR Hold] cefTRIAXone (ROCEPHIN)  IV 2 g (10/20/19 2204)  . [MAR Hold] metronidazole 500 mg (10/21/19 0621)     LOS: 4 days    Time spent:40 min    Charles Andringa, Geraldo Docker, MD [facility] Pager [phone]  If 7PM-7AM, please contact night-coverage [email or web address] Password [facility] 10/21/2019, 9:34 AM

## 2019-10-21 NOTE — Care Management (Signed)
Consult for SNF, Home health see previous TOC notes .   Thanks   [name] RN

## 2019-10-21 NOTE — Transfer of Care (Signed)
Immediate Anesthesia Transfer of Care Note  Patient: Allan Minotti  Procedure(s) Performed: RIGHT BELOW KNEE AMPUTATION (Right Knee)  Patient Location: PACU  Anesthesia Type:GA combined with regional for post-op pain  Level of Consciousness: awake and alert   Airway & Oxygen Therapy: Patient Spontanous Breathing and Patient connected to nasal cannula oxygen  Post-op Assessment: Report given to RN and Post -op Vital signs reviewed and stable  Post vital signs: Reviewed and stable  Last Vitals:  Vitals Value Taken Time  BP 157/80 10/21/19 1050  Temp 36.3 C 10/21/19 1050  Pulse 74 10/21/19 1053  Resp 15 10/21/19 1053  SpO2 96 % 10/21/19 1053  Vitals shown include unvalidated device data.  Last Pain:  Vitals:   10/21/19 1050  TempSrc:   PainSc: (P) 0-No pain      Patients Stated Pain Goal: 0 (51/83/43 7357)  Complications: No complications documented.

## 2019-10-22 ENCOUNTER — Encounter (HOSPITAL_COMMUNITY): Payer: Self-pay | Admitting: Orthopedic Surgery

## 2019-10-22 DIAGNOSIS — R55 Syncope and collapse: Secondary | ICD-10-CM | POA: Diagnosis not present

## 2019-10-22 DIAGNOSIS — L02611 Cutaneous abscess of right foot: Secondary | ICD-10-CM | POA: Diagnosis not present

## 2019-10-22 DIAGNOSIS — Z7189 Other specified counseling: Secondary | ICD-10-CM

## 2019-10-22 DIAGNOSIS — F1410 Cocaine abuse, uncomplicated: Secondary | ICD-10-CM | POA: Diagnosis not present

## 2019-10-22 DIAGNOSIS — M86271 Subacute osteomyelitis, right ankle and foot: Secondary | ICD-10-CM | POA: Diagnosis not present

## 2019-10-22 DIAGNOSIS — E138 Other specified diabetes mellitus with unspecified complications: Secondary | ICD-10-CM | POA: Diagnosis not present

## 2019-10-22 DIAGNOSIS — Z515 Encounter for palliative care: Secondary | ICD-10-CM | POA: Diagnosis not present

## 2019-10-22 LAB — PHOSPHORUS: Phosphorus: 5.7 mg/dL — ABNORMAL HIGH (ref 2.5–4.6)

## 2019-10-22 LAB — CBC WITH DIFFERENTIAL/PLATELET
Abs Immature Granulocytes: 0.04 10*3/uL (ref 0.00–0.07)
Basophils Absolute: 0 10*3/uL (ref 0.0–0.1)
Basophils Relative: 0 %
Eosinophils Absolute: 0 10*3/uL (ref 0.0–0.5)
Eosinophils Relative: 0 %
HCT: 28.7 % — ABNORMAL LOW (ref 39.0–52.0)
Hemoglobin: 8.7 g/dL — ABNORMAL LOW (ref 13.0–17.0)
Immature Granulocytes: 1 %
Lymphocytes Relative: 16 %
Lymphs Abs: 1.3 10*3/uL (ref 0.7–4.0)
MCH: 25.7 pg — ABNORMAL LOW (ref 26.0–34.0)
MCHC: 30.3 g/dL (ref 30.0–36.0)
MCV: 84.7 fL (ref 80.0–100.0)
Monocytes Absolute: 0.5 10*3/uL (ref 0.1–1.0)
Monocytes Relative: 6 %
Neutro Abs: 6.7 10*3/uL (ref 1.7–7.7)
Neutrophils Relative %: 77 %
Platelets: 472 10*3/uL — ABNORMAL HIGH (ref 150–400)
RBC: 3.39 MIL/uL — ABNORMAL LOW (ref 4.22–5.81)
RDW: 14.6 % (ref 11.5–15.5)
WBC: 8.6 10*3/uL (ref 4.0–10.5)
nRBC: 0 % (ref 0.0–0.2)

## 2019-10-22 LAB — IRON AND TIBC
Iron: 36 ug/dL — ABNORMAL LOW (ref 45–182)
Saturation Ratios: 14 % — ABNORMAL LOW (ref 17.9–39.5)
TIBC: 258 ug/dL (ref 250–450)
UIBC: 222 ug/dL

## 2019-10-22 LAB — FOLATE: Folate: 8.2 ng/mL (ref >5.9)

## 2019-10-22 LAB — FERRITIN: Ferritin: 108 ng/mL (ref 24–336)

## 2019-10-22 LAB — COMPREHENSIVE METABOLIC PANEL
ALT: 7 U/L (ref 0–44)
AST: 12 U/L — ABNORMAL LOW (ref 15–41)
Albumin: 2.5 g/dL — ABNORMAL LOW (ref 3.5–5.0)
Alkaline Phosphatase: 88 U/L (ref 38–126)
Anion gap: 11 (ref 5–15)
BUN: 93 mg/dL — ABNORMAL HIGH (ref 6–20)
CO2: 22 mmol/L (ref 22–32)
Calcium: 9.2 mg/dL (ref 8.9–10.3)
Chloride: 103 mmol/L (ref 98–111)
Creatinine, Ser: 4.87 mg/dL — ABNORMAL HIGH (ref 0.61–1.24)
GFR calc Af Amer: 14 mL/min — ABNORMAL LOW (ref >60)
GFR calc non Af Amer: 12 mL/min — ABNORMAL LOW (ref >60)
Glucose, Bld: 207 mg/dL — ABNORMAL HIGH (ref 70–99)
Potassium: 4.9 mmol/L (ref 3.5–5.1)
Sodium: 136 mmol/L (ref 135–145)
Total Bilirubin: 0.5 mg/dL (ref 0.3–1.2)
Total Protein: 8.1 g/dL (ref 6.5–8.1)

## 2019-10-22 LAB — RETICULOCYTES
Immature Retic Fract: 12.3 % (ref 2.3–15.9)
RBC.: 3.35 MIL/uL — ABNORMAL LOW (ref 4.22–5.81)
Retic Count, Absolute: 39.9 10*3/uL (ref 19.0–186.0)
Retic Ct Pct: 1.2 % (ref 0.4–3.1)

## 2019-10-22 LAB — GLUCOSE, CAPILLARY
Glucose-Capillary: 158 mg/dL — ABNORMAL HIGH (ref 70–99)
Glucose-Capillary: 174 mg/dL — ABNORMAL HIGH (ref 70–99)
Glucose-Capillary: 252 mg/dL — ABNORMAL HIGH (ref 70–99)
Glucose-Capillary: 206 mg/dL — ABNORMAL HIGH (ref 70–99)

## 2019-10-22 LAB — VITAMIN B12: Vitamin B-12: 708 pg/mL (ref 180–914)

## 2019-10-22 LAB — MAGNESIUM: Magnesium: 1.8 mg/dL (ref 1.7–2.4)

## 2019-10-22 MED ORDER — INSULIN ASPART 100 UNIT/ML ~~LOC~~ SOLN
8.0000 [IU] | Freq: Three times a day (TID) | SUBCUTANEOUS | Status: DC
  Administered 2019-10-23 – 2019-10-25 (×9): 8 [IU] via SUBCUTANEOUS

## 2019-10-22 MED ORDER — ENSURE MAX PROTEIN PO LIQD
11.0000 [oz_av] | Freq: Two times a day (BID) | ORAL | Status: DC
  Administered 2019-10-22 – 2019-12-09 (×60): 11 [oz_av] via ORAL
  Filled 2019-10-22 (×104): qty 330

## 2019-10-22 NOTE — Progress Notes (Signed)
Inpatient Rehab Admissions Coordinator Note:   Per PT recommendations, pt was screened for CIR candidacy by Shann Medal, PT, DPT.  At this time we are recommending CIR consult, and I will place an order per our protocol.  Please contact me with questions.   Shann Medal, PT, DPT [phone] 10/22/19 1:45 PM

## 2019-10-22 NOTE — Evaluation (Signed)
Occupational Therapy Evaluation Patient Details Name: Zachary Young MRN: [id number] DOB: 1960/06/22 Today's Date: 10/22/2019    History of Present Illness Zachary Young is an 59 y.o. male with medical history significant of kidney disease, diabetes type 2, chronic combined CHF, HTN, diabetic neuropathy with pain who presented after passing out. s/p R BKA.   Clinical Impression   PTA, pt was living at home alone, pt reports he was independent with ADL/IADL and modified independent at spc level. Pt currently requires minguard for lateral scoot transfer toward left. Pt requires minA for LB dressing. Pt declined d/c to SNF. He reports he would like to d/c to his sister's house but this plan is not set.  Due to decline in current level of function, pt would benefit from acute OT to address established goals to facilitate safe D/C to venue listed below. At this time, recommend SNF follow-up, should pt decline and d/c home he will need 24/7 assistance and HHOT. Will continue to follow acutely.     Follow Up Recommendations  SNF;Supervision/Assistance - 24 hour;Home health OT    Equipment Recommendations  3 in 1 bedside commode (drop arm)    Recommendations for Other Services PT consult     Precautions / Restrictions Precautions Precautions: Fall Precaution Comments: R BKA Required Braces or Orthoses: Other Brace Other Brace: R limb guard Restrictions Weight Bearing Restrictions: Yes RUE Weight Bearing: Non weight bearing      Mobility Bed Mobility Overal bed mobility: Modified Independent             General bed mobility comments: increased time and effort, use of bed rails and HOB elevated  Transfers Overall transfer level: Needs assistance Equipment used: None Transfers: Lateral/Scoot Transfers          Lateral/Scoot Transfers: Min guard General transfer comment: minguard to lateral scoot toward left to drop arm recliner, no physical assistance provided    Balance  Overall balance assessment: Needs assistance Sitting-balance support: No upper extremity supported;Feet supported Sitting balance-Leahy Scale: Normal                                     ADL either performed or assessed with clinical judgement   ADL Overall ADL's : Needs assistance/impaired Eating/Feeding: Set up;Sitting   Grooming: Set up;Sitting   Upper Body Bathing: Set up;Sitting   Lower Body Bathing: Minimal assistance;Sitting/lateral leans   Upper Body Dressing : Set up;Sitting   Lower Body Dressing: Minimal assistance;Sitting/lateral leans Lower Body Dressing Details (indicate cue type and reason): assist to don sock Toilet Transfer: Min guard (lateral scoot to drop arm) Toilet Transfer Details (indicate cue type and reason): lateral scoot toward left to drop arm recliner;no physical assistance provided   Toileting - Clothing Manipulation Details (indicate cue type and reason): deferred       General ADL Comments: pt with good sitting balance, able to reach far outside base of support and return to midline without LOB and with good control;pt lateral scoot toward left to drop arm recliner with no physical assistance provided     Vision Patient Visual Report: No change from baseline       Perception     Praxis      Pertinent Vitals/Pain Pain Assessment: 0-10 Pain Score: 10-Worst pain ever Pain Location: RLE Pain Descriptors / Indicators: Sore;Constant Pain Intervention(s): Monitored during session;Premedicated before session;Repositioned     Hand Dominance Right   Extremity/Trunk  Assessment Upper Extremity Assessment Upper Extremity Assessment: Overall WFL for tasks assessed   Lower Extremity Assessment Lower Extremity Assessment: RLE deficits/detail RLE Deficits / Details: s/p R BKA   Cervical / Trunk Assessment Cervical / Trunk Assessment: Normal   Communication Communication Communication: No difficulties   Cognition  Arousal/Alertness: Awake/alert Behavior During Therapy: WFL for tasks assessed/performed Overall Cognitive Status: No family/caregiver present to determine baseline cognitive functioning                                 General Comments: pt with tangential thinking, appeared to have difficulty following along with conversation;pt highly distractable by TV, difficult to thoroughly assess;pt crass during session but appropriate with therapist   General Comments  vss    Exercises Exercises: Other exercises Other Exercises Other Exercises: began education on phantom limb pain and sensation   Shoulder Instructions      Home Living Family/patient expects to be discharged to:: Unsure Living Arrangements: Alone Available Help at Discharge: Family Type of Home: Mobile home Home Access: Stairs to enter Technical brewer of Steps: 2   Home Layout: One level     Bathroom Shower/Tub: Teacher, early years/pre: Standard Bathroom Accessibility: Yes How Accessible: Accessible via walker Home Equipment: Cane - single point   Additional Comments: Pt reports living home alone, reports he may d/c to sister's home but this plan is not set      Prior Functioning/Environment Level of Independence: Needs assistance  Gait / Transfers Assistance Needed: walks household distances with cane ADL's / Homemaking Assistance Needed: pt reports he was independent with ADL;per chart hx pt required assistance for bathing and dressing, family was assisting with IADL   Comments: difficult to achieve PLOF as pt was highly distractable by TV despite therapist turning it off, pt continuously turning it back on        OT Problem List: Decreased activity tolerance;Impaired balance (sitting and/or standing);Decreased safety awareness;Decreased knowledge of use of DME or AE;Decreased knowledge of precautions;Obesity;Pain      OT Treatment/Interventions: Self-care/ADL  training;Therapeutic exercise;Energy conservation;DME and/or AE instruction;Therapeutic activities;Cognitive remediation/compensation;Patient/family education;Balance training    OT Goals(Current goals can be found in the care plan section) Acute Rehab OT Goals Patient Stated Goal: to go home OT Goal Formulation: With patient Time For Goal Achievement: 11/05/19 Potential to Achieve Goals: Good ADL Goals Pt Will Perform Lower Body Dressing: with modified independence;with adaptive equipment;sitting/lateral leans Pt Will Transfer to Toilet: stand pivot transfer;with min assist Pt Will Perform Tub/Shower Transfer: Tub transfer;tub bench;with min assist  OT Frequency: Min 2X/week   Barriers to D/C: Inaccessible home environment;Decreased caregiver support  pt has stairs into home, lives alone, unsure if able to d/c to sister's house       Co-evaluation              AM-PAC OT "6 Clicks" Daily Activity     Outcome Measure Help from another person eating meals?: A Little Help from another person taking care of personal grooming?: A Little Help from another person toileting, which includes using toliet, bedpan, or urinal?: A Lot Help from another person bathing (including washing, rinsing, drying)?: A Lot Help from another person to put on and taking off regular upper body clothing?: A Little Help from another person to put on and taking off regular lower body clothing?: A Lot 6 Click Score: 15   End of Session Nurse Communication: Mobility status  Activity Tolerance: Patient tolerated treatment well Patient left: in chair;with call bell/phone within reach;with chair alarm set  OT Visit Diagnosis: Other abnormalities of gait and mobility (R26.89);Unsteadiness on feet (R26.81);Muscle weakness (generalized) (M62.81);Other symptoms and signs involving cognitive function;Pain Pain - Right/Left: Right Pain - part of body: Leg                Time: 0911-0929 OT Time Calculation (min):  18 min Charges:  OT General Charges $OT Visit: 1 Visit OT Evaluation $OT Eval Moderate Complexity: [place] OTR/L Acute Rehabilitation Services Office: [place] 10/22/2019, 10:39 AM

## 2019-10-22 NOTE — TOC Progression Note (Signed)
Transition of Care [facility]) - Progression Note    Patient Details  Name: Zachary Young MRN: [id number] Date of Birth: [birth year]  [facility] CM/SW Contact  Jacalyn Lefevre Edson Snowball, RN Phone Number: 10/22/2019, 12:46 PM  Clinical Narrative:        Spoke to patient at bedside. He wants to be discharged today, he will stay with his sister Webb Silversmith) one day and then go to his MD at [facility]. NCM offered to call Webb Silversmith. Patient refused for NCM to call Webb Silversmith or [name]. Patient became upset with NCM.   Secure chatted Dr Sherral Hammers and Audrea Muscat Persons PA     Expected Discharge Plan and Services     Discharge Planning Services: CM Consult   Living arrangements for the past 2 months: Single Family Home                                       Social Determinants of Health (SDOH) Interventions    Readmission Risk Interventions No flowsheet data found.

## 2019-10-22 NOTE — Consult Note (Signed)
Consultation Note Date: 10/22/2019   Patient Name: Zachary Young  DOB: 19-Mar-1961  MRN: [id number]  Age / Sex: 59 y.o., male  PCP: System, Pcp Not In Referring Physician: Allie Bossier, MD  Reason for Consultation: Establishing goals of care  HPI/Patient Profile: 59 y.o. male  with past medical history of CKD V, poorly controlled T2DM, diabetic neuropathy, CHF, HTN, and substance abuse admitted on 10/17/2019 with syncopal episode. Found to have a new wound on plantar surface of right foot. Patient had R BKA on 7/7. PMT consulted for [place].  Clinical Assessment and Goals of Care: I have reviewed medical records including EPIC notes, labs and imaging, received report from RN, assessed the patient and then met with patient  to discuss diagnosis prognosis, GOC, EOL wishes, disposition and options.  I introduced Palliative Medicine as specialized medical care for people living with serious illness. It focuses on providing relief from the symptoms and stress of a serious illness. The goal is to improve quality of life for both the patient and the family.  Patient is somewhat reluctant to talk to me - does not want to turn TV off to discuss goals of care, rarely makes eye contact. I asked Zachary Young about his understanding of his illness and he replies "Why does everybody keep talking about my kidneys and dying? I don't want to talk about dying anymore!"   I attempted to explain our desire to understand his wishes and goals of care. Zachary Young explains he is interested in all medical interventions offered to prolong life. He tells me "I don't need dialysis right now but if it were do dialysis or death I would do dialysis". We also discussed code status - he tells me he is interested in full code. He tells me he would want to be on the ventilator - even long-term.   We discuss Zachary. Efferson's decision maker if he were unable to make decisions for himself. He is  not married and does not have children. His next of kin are his sisters - Zachary Young and Zachary Young - he would want them to make decisions together. Will update demographic list to include both sisters.   Discussed with patient the importance of continued conversation with family and the medical providers regarding overall plan of care and treatment options, ensuring decisions are within the context of the patient's values and GOCs.    Questions and concerns were addressed. The family was encouraged to call with questions or concerns.   Primary Decision Maker PATIENT  Next of kin - sisters - Zachary Young and Zachary Young - if patient unable  SUMMARY OF RECOMMENDATIONS   - patient's understanding of illness limited and not receptive to further discussion - interested in all medical interventions offered to prolong life - Full code/full scope care - patient not open to further discussion of goals of care - no plan for follow up - please call if additional needs arise  Code Status/Advance Care Planning:  Full code  Additional Recommendations (Limitations, Scope, Preferences):  Full Scope Treatment  Prognosis:   Unable to determine  Discharge Planning: To Be Determined      Primary Diagnoses: Present on Admission: . Wound of right foot . Chronic kidney disease, stage 4 (severe) ([place]) . HLD (hyperlipidemia) . HTN (hypertension) . Tobacco use disorder . Cocaine abuse ([place]) . Subacute osteomyelitis, right ankle and foot ([place]) . Abscess of right foot . Systolic and diastolic CHF, chronic ([place]) . Hypertensive heart disease with CHF (congestive heart failure) (  [place]) . Diabetic neuropathy ([place]) . Syncope and collapse . Anemia of chronic disease . Severe obesity (BMI 35.0-39.9) with comorbidity ([place]) . Metabolic acidosis . DM (diabetes mellitus), secondary, uncontrolled, with complications ([place]) . Elevated sedimentation rate . Elevated C-reactive protein (CRP) . Hypoalbuminemia   I have reviewed the  medical record, interviewed the patient and family, and examined the patient. The following aspects are pertinent.  Past Medical History:  Diagnosis Date  . CHF (congestive heart failure) ([place])   . Diabetes mellitus without complication ([place])   . Peripheral edema   . Renal disorder    kidney disease   Social History   Socioeconomic History  . Marital status: Single    Spouse name: Not on file  . Number of children: Not on file  . Years of education: Not on file  . Highest education level: Not on file  Occupational History  . Not on file  Tobacco Use  . Smoking status: Current Every Day Smoker    Packs/day: 0.50    Types: Cigarettes  . Smokeless tobacco: Never Used  Vaping Use  . Vaping Use: Never used  Substance and Sexual Activity  . Alcohol use: Not Currently  . Drug use: Yes    Types: Cocaine  . Sexual activity: Not on file  Other Topics Concern  . Not on file  Social History Narrative  . Not on file   Social Determinants of Health   Financial Resource Strain:   . Difficulty of Paying Living Expenses:   Food Insecurity:   . Worried About Charity fundraiser in the Last Year:   . Arboriculturist in the Last Year:   Transportation Needs:   . Film/video editor (Medical):   Marland Kitchen Lack of Transportation (Non-Medical):   Physical Activity:   . Days of Exercise per Week:   . Minutes of Exercise per Session:   Stress:   . Feeling of Stress :   Social Connections:   . Frequency of Communication with Friends and Family:   . Frequency of Social Gatherings with Friends and Family:   . Attends Religious Services:   . Active Member of Clubs or Organizations:   . Attends Archivist Meetings:   Marland Kitchen Marital Status:    Family History  Problem Relation Age of Onset  . Cancer Mother    Scheduled Meds: . amLODipine  10 mg Oral Daily  . aspirin EC  81 mg Oral Daily  . collagenase   Topical Daily  . docusate sodium  100 mg Oral BID  . enoxaparin (LOVENOX)  injection  30 mg Subcutaneous Q24H  . gabapentin  300 mg Oral Daily   And  . gabapentin  600 mg Oral QHS  . insulin aspart  0-20 Units Subcutaneous TID WC  . insulin aspart  0-5 Units Subcutaneous QHS  . insulin glargine  10 Units Subcutaneous QHS  . multivitamin with minerals  1 tablet Oral Daily  . nutrition supplement (JUVEN)  1 packet Oral BID BM  . Ensure Max Protein  11 oz Oral TID  . sodium chloride flush  3 mL Intravenous Q12H  . torsemide  40 mg Oral BID  . vancomycin variable dose per unstable renal function (pharmacist dosing)   Does not apply See admin instructions   Continuous Infusions: . cefTRIAXone (ROCEPHIN)  IV 2 g (10/21/19 2117)  . metronidazole 500 mg (10/22/19 0624)   PRN Meds:.albuterol, HYDROmorphone (DILAUDID) injection, hydrOXYzine, metoCLOPramide **OR** metoCLOPramide (REGLAN) injection, ondansetron **  OR** ondansetron (ZOFRAN) IV, oxyCODONE-acetaminophen Allergies  Allergen Reactions  . Bee Venom Anaphylaxis    Per pt, nearly died from bee sting as a child  . Propofol Other (See Comments)     Hallucinations  . Eggs Or Egg-Derived Products Nausea And Vomiting and Other (See Comments)    Stomach cramps  . Morphine Nausea And Vomiting  . Oxycodone Nausea And Vomiting   Review of Systems  Respiratory: Negative for shortness of breath.   Genitourinary: Negative for decreased urine volume.  Musculoskeletal:       Itching at surgical site, pain well controlled with percocet  All other systems reviewed and are negative.   Physical Exam Constitutional:      General: He is not in acute distress. Pulmonary:     Effort: Pulmonary effort is normal. No respiratory distress.  Musculoskeletal:     Comments: R BKA - surgical dressing intact  Skin:    General: Skin is warm and dry.  Neurological:     Mental Status: He is alert and oriented to person, place, and time.     Vital Signs: BP 124/75 (BP Location: Left Arm)   Pulse 75   Temp 98.4 F (36.9 C)  (Oral)   Resp 18   Ht 6' 3"  (1.905 m)   Wt 133.4 kg   SpO2 96%   BMI 36.76 kg/m  Pain Scale: 0-10   Pain Score: Asleep   SpO2: SpO2: 96 % O2 Device:SpO2: 96 % O2 Flow Rate: .O2 Flow Rate (L/min): 2 L/min  IO: Intake/output summary:   Intake/Output Summary (Last 24 hours) at 10/22/2019 1043 Last data filed at 10/22/2019 0941 Gross per 24 hour  Intake 843 ml  Output 1525 ml  Net -682 ml    LBM: Last BM Date:  (pt refuses to answer) Baseline Weight: Weight: 135.4 kg Most recent weight: Weight: 133.4 kg     Palliative Assessment/Data: PPS 40%    Time Total: 50 minutes Greater than 50%  of this time was spent counseling and coordinating care related to the above assessment and plan.  Juel Burrow, DNP, AGNP-C Palliative Medicine Team [phone] Pager: [phone]

## 2019-10-22 NOTE — Progress Notes (Signed)
Nutrition Follow-up  DOCUMENTATION CODES:   Obesity unspecified  INTERVENTION:  Provide Ensure Max po BID, each supplement provides 150 kcal and 30 grams of protein.   Continue Juven BID, each packet provides 95 calories, 2.5 grams of protein (collagen) for wound healing.   Diet handout and education given regarding diabetes.   NUTRITION DIAGNOSIS:   Increased nutrient needs related to wound healing as evidenced by estimated needs; ongoing  GOAL:   Patient will meet greater than or equal to 90% of their needs; met  MONITOR:   PO intake, Supplement acceptance, Skin, Labs, Weight trends, I & O's  REASON FOR ASSESSMENT:   Consult Wound healing  ASSESSMENT:   Pt presented with wound of R foot and hyperglycemia. PMH significant for kidney disease, T2DM, CHF, HTN, diabetic neuropathy  Procedure (7/7): Right BKA  Meal completion has been 100%. Pt reports having a good appetite currently and PTA with usual consumption of at least 2-3 meals a day with no difficulties. Pt currently has Ensure Max and Juven ordered and has been consuming them. RD to continue with current orders to aid in caloric and protein needs. RD consulted for diet education regarding diabetes. Handout "Carbohydrate counting for people with diabetes" from the Academy of Nutrition and Dietetics Manual given and discussed with pt. Pt diet recall obtained. Discussed appropriate portion sizes of carbohydrate containing foods and diabetic friendly drink options. Pt with no questions regarding information discussed.   Labs and mediations reviewed.  NUTRITION - FOCUSED PHYSICAL EXAM:    Most Recent Value  Orbital Region No depletion  Upper Arm Region No depletion  Thoracic and Lumbar Region No depletion  Buccal Region No depletion  Temple Region No depletion  Clavicle Bone Region No depletion  Clavicle and Acromion Bone Region No depletion  Scapular Bone Region No depletion  Dorsal Hand No depletion  Patellar  Region No depletion  Anterior Thigh Region No depletion  Posterior Calf Region No depletion  Edema (RD Assessment) Mild  Hair Reviewed  Eyes Reviewed  Mouth Reviewed  Skin Reviewed  Nails Reviewed       Diet Order:   Diet Order            Diet Carb Modified Fluid consistency: Thin; Room service appropriate? Yes  Diet effective now                 EDUCATION NEEDS:   No education needs have been identified at this time  Skin:  Skin Assessment: Skin Integrity Issues: Skin Integrity Issues:: Incisions, Wound VAC Wound Vac: R leg Diabetic Ulcer: R foot Incisions: R leg Other: amputation - all toes  Last BM:  Unknown  Height:   Ht Readings from Last 1 Encounters:  10/21/19 6' 3"  (1.905 m)    Weight:   Wt Readings from Last 1 Encounters:  10/22/19 133.4 kg    BMI:  Body mass index is 36.76 kg/m.  Estimated Nutritional Needs:   Kcal:  2400-2600  Protein:  130-140 grams  Fluid:  >2L/d  Corrin Parker, MS, RD, LDN RD pager number/after hours weekend pager number on Amion.

## 2019-10-22 NOTE — Progress Notes (Signed)
Patient is postop day 1 status post below-knee amputation.  He is sitting in bed appears comfortable.  Vital signs stable afebrile discussed with the patient that his discharge options are limited.  Certainly inpatient rehab may be a possibility but because of his record skilled nursing and home health are going to be difficult.  Patient became angry and stated that he wanted to be discharged to his brother's house in [place] and that he has had home health available to him there in the past will discuss with social worker

## 2019-10-22 NOTE — Consult Note (Signed)
Reason for Consult: AKI/CKD stage IV-V Referring Physician: Jia Mohamed is an 59 y.o. male has a PMH significant for HTN, DM type 2, chronic combined systolic and diastolic CHF, HTN, polysubstance abuse, and CKD stage IV (followed by Dr. Radene Knee at [facility] in [place]), who presented to [facility] ED after a syncopal event on 10/17/19.  In the ED he was found to have a diabetic foot ulcer on his right foot and was admitted for IV antibiotics and further evaluation which eventually revealed osteomyelitis.  He underwent RBKA on 10/21/19.  He was also noted to have AKI/CKD stage IV with BUN 97 and Scr 5.74 (baseline appears to be around 4).  He was given bolus of IV fluids and his Scr initially improved but has reached a plateau of 4.66-4.87.  We were consulted to further evaluate his AKI/CKD.    I spoke with him regarding the chronicity and severity of his CKD.  He is followed regularly by his primary nephrologist in [place] who has discussed dialysis with him in the past, however he tells me that he would not do dialysis.  He became angry when discussing the consequences of refusing dialysis and did not want to discuss it further.  Nor would he entertain placing and AVF or AVG at this time.  Trend in Creatinine: Creatinine, Ser  Date/Time Value Ref Range Status  10/22/2019 02:44 AM 4.87 (H) 0.61 - 1.24 mg/dL Final  10/21/2019 04:59 AM 4.66 (H) 0.61 - 1.24 mg/dL Final  10/20/2019 07:12 AM 5.13 (H) 0.61 - 1.24 mg/dL Final  10/19/2019 05:49 AM 5.43 (H) 0.61 - 1.24 mg/dL Final  10/18/2019 08:15 AM 5.32 (H) 0.61 - 1.24 mg/dL Final  10/17/2019 11:12 PM 5.45 (H) 0.61 - 1.24 mg/dL Final  10/17/2019 03:33 PM 5.74 (H) 0.61 - 1.24 mg/dL Final  08/31/2019 12:14 AM 4.65 (H) 0.61 - 1.24 mg/dL Final  08/30/2019 09:52 PM 4.77 (H) 0.61 - 1.24 mg/dL Final  08/20/2019 11:19 PM 3.78 (H) 0.61 - 1.24 mg/dL Final  08/20/2019 04:49 AM 3.86 (H) 0.61 - 1.24 mg/dL Final  08/19/2019 05:48 AM 3.83 (H) 0.61 - 1.24 mg/dL  Final  08/18/2019 05:26 AM 4.11 (H) 0.61 - 1.24 mg/dL Final  08/17/2019 06:08 AM 3.93 (H) 0.61 - 1.24 mg/dL Final  08/16/2019 06:33 AM 4.06 (H) 0.61 - 1.24 mg/dL Final  08/15/2019 09:44 AM 4.09 (H) 0.61 - 1.24 mg/dL Final  12/15/2018 07:05 AM 2.09 (H) 0.61 - 1.24 mg/dL Final  12/14/2018 07:25 PM 2.21 (H) 0.61 - 1.24 mg/dL Final  04/08/2016 01:48 PM 1.64 (H) 0.61 - 1.24 mg/dL Final    PMH:   Past Medical History:  Diagnosis Date  . CHF (congestive heart failure) ([place])   . Diabetes mellitus without complication ([place])   . Peripheral edema   . Renal disorder    kidney disease    PSH:   Past Surgical History:  Procedure Laterality Date  . AMPUTATION Right 10/21/2019   Procedure: RIGHT BELOW KNEE AMPUTATION;  Surgeon: Newt Minion, MD;  Location: [facility];  Service: Orthopedics;  Laterality: Right;  . TOE AMPUTATION Bilateral    due to osteomyelitis, all 5 each foot    Allergies:  Allergies  Allergen Reactions  . Bee Venom Anaphylaxis    Per pt, nearly died from bee sting as a child  . Propofol Other (See Comments)     Hallucinations  . Eggs Or Egg-Derived Products Nausea And Vomiting and Other (See Comments)    Stomach cramps  .  Morphine Nausea And Vomiting  . Oxycodone Nausea And Vomiting    Medications:   Prior to Admission medications   Medication Sig Start Date End Date Taking? Authorizing Provider  albuterol (VENTOLIN HFA) 108 (90 Base) MCG/ACT inhaler Inhale 1 puff into the lungs every 4 (four) hours as needed for wheezing or shortness of breath. 08/20/19  Yes Gladys Damme, MD  amLODipine (NORVASC) 10 MG tablet Take 1 tablet (10 mg total) by mouth daily. 08/21/19  Yes Gladys Damme, MD  aspirin EC 81 MG EC tablet Take 1 tablet (81 mg total) by mouth daily. 08/21/19  Yes Gladys Damme, MD  ergocalciferol (VITAMIN D2) 1.25 MG (50000 UT) capsule Take 50,000 Units by mouth every Friday.  07/13/19  Yes [provider]  gabapentin (NEURONTIN) 300 MG capsule Take  300-600 mg by mouth See admin instructions. Take one capsule (300 mg) by mouth every morning and two capsules (600 mg) at night   Yes [provider]  insulin glargine (LANTUS) 100 UNIT/ML injection Inject 10 Units into the skin at bedtime.    Yes [provider]  insulin lispro (HUMALOG) 100 UNIT/ML injection Inject 4 Units into the skin 3 (three) times daily with meals.    Yes [provider]  oxyCODONE-acetaminophen (PERCOCET/ROXICET) 5-325 MG tablet Take by mouth every 6 (six) hours as needed for severe pain.   Yes [provider]  torsemide (DEMADEX) 20 MG tablet Take 4 tablets (80 mg total) by mouth 2 (two) times daily. Patient taking differently: Take 20 mg by mouth daily.  08/20/19  Yes Gladys Damme, MD  Insulin Pen Needle (PEN NEEDLES 31GX5/16") 31G X 8 MM MISC 1 each by Other route daily. 05/16/19 05/15/20  [provider]  Insulin Syringe-Needle U-100 (GLOBAL INJECT EASE INSULIN SYR) 30G X 1/2" 0.5 ML MISC 1 each by Other route 4 (four) times daily as needed. Insulin 07/13/19   [provider]  metoprolol succinate (TOPROL-XL) 100 MG 24 hr tablet Take 100 mg by mouth daily. Take with or immediately following a meal. Patient not taking: Reported on 10/17/2019    [provider]  potassium chloride 20 MEQ TBCR Take 40 mEq by mouth daily. Patient not taking: Reported on 10/17/2019 08/20/19 09/19/19  Eulis Foster, MD    Inpatient medications: . amLODipine  10 mg Oral Daily  . aspirin EC  81 mg Oral Daily  . collagenase   Topical Daily  . docusate sodium  100 mg Oral BID  . enoxaparin (LOVENOX) injection  30 mg Subcutaneous Q24H  . gabapentin  300 mg Oral Daily   And  . gabapentin  600 mg Oral QHS  . insulin aspart  0-20 Units Subcutaneous TID WC  . insulin aspart  0-5 Units Subcutaneous QHS  . insulin glargine  10 Units Subcutaneous QHS  . multivitamin with minerals  1 tablet Oral Daily  . nutrition supplement  (JUVEN)  1 packet Oral BID BM  . Ensure Max Protein  11 oz Oral TID  . sodium chloride flush  3 mL Intravenous Q12H  . torsemide  40 mg Oral BID  . vancomycin variable dose per unstable renal function (pharmacist dosing)   Does not apply See admin instructions    Discontinued Meds:   Medications Discontinued During This Encounter  Medication Reason  . fentaNYL (SUBLIMAZE) injection 100 mcg   . isosorbide-hydrALAZINE (BIDIL) 20-37.5 MG tablet Discontinued by provider  . pantoprazole (PROTONIX) 40 MG tablet Patient Preference  . potassium chloride SA (KLOR-CON) 20  MEQ tablet Patient Preference  . albuterol (VENTOLIN HFA) 108 (90 Base) MCG/ACT inhaler 1 puff   . gabapentin (NEURONTIN) capsule 300-600 mg   . torsemide (DEMADEX) tablet 40 mg   . 0.9 % irrigation (POUR BTL) Patient Discharge  . 0.9 %  sodium chloride infusion Patient Transfer  . fentaNYL (SUBLIMAZE) injection 25-50 mcg Patient Transfer  . ondansetron (ZOFRAN) injection 4 mg Patient Transfer    Social History:  reports that he has been smoking cigarettes. He has been smoking about 0.50 packs per day. He has never used smokeless tobacco. He reports previous alcohol use. He reports current drug use. Drug: Cocaine.  Family History:   Family History  Problem Relation Age of Onset  . Cancer Mother     Pertinent items are noted in HPI. Weight change:   Intake/Output Summary (Last 24 hours) at 10/22/2019 1128 Last data filed at 10/22/2019 0941 Gross per 24 hour  Intake 843 ml  Output 1525 ml  Net -682 ml   BP 124/75 (BP Location: Left Arm)   Pulse 75   Temp 98.4 F (36.9 C) (Oral)   Resp 18   Ht 6\' 3"  (1.905 m)   Wt 133.4 kg   SpO2 96%   BMI 36.76 kg/m  Vitals:   10/21/19 2012 10/22/19 0014 10/22/19 0415 10/22/19 0821  BP: 122/74 115/70 108/78 124/75  Pulse: 84 79 84 75  Resp: 17 17 19 18   Temp: 97.9 F (36.6 C) 98.2 F (36.8 C) 98.4 F (36.9 C) 98.4 F (36.9 C)  TempSrc: Oral Oral Oral Oral  SpO2:  96%   96%  Weight:  133.4 kg    Height:         General appearance: alert, no distress and moderately obese Head: Normocephalic, without obvious abnormality, atraumatic Resp: clear to auscultation bilaterally Cardio: regular rate and rhythm, S1, S2 normal, no murmur, click, rub or gallop GI: soft, non-tender; bowel sounds normal; no masses,  no organomegaly Extremities: s/p left transmetatarsal amputation, s/p RBKA, no edema  Labs: Basic Metabolic Panel: Recent Labs  Lab 10/17/19 1533 10/17/19 2312 10/18/19 0815 10/19/19 0549 10/20/19 0712 10/21/19 0459 10/22/19 0244  NA 136  --  139 142 141 140 136  K 3.5  --  3.8 4.0 4.3 4.3 4.9  CL 103  --  106 111 107 104 103  CO2 19*  --  20* 19* 19* 23 22  GLUCOSE 264*  --  218* 150* 181* 179* 207*  BUN 97*  --  88* 85* 83* 91* 93*  CREATININE 5.74* 5.45* 5.32* 5.43* 5.13* 4.66* 4.87*  ALBUMIN  --   --  2.3*  --   --   --  2.5*  CALCIUM 7.3*  --  7.3* 7.7* 8.2* 8.9 9.2  PHOS  --   --   --   --   --   --  5.7*   Liver Function Tests: Recent Labs  Lab 10/18/19 0815 10/22/19 0244  AST 11* 12*  ALT 10 7  ALKPHOS 84 88  BILITOT 0.5 0.5  PROT 8.1 8.1  ALBUMIN 2.3* 2.5*   No results for input(s): LIPASE, AMYLASE in the last 168 hours. No results for input(s): AMMONIA in the last 168 hours. CBC: Recent Labs  Lab 10/19/19 0549 10/20/19 0712 10/21/19 0459 10/22/19 0244  WBC 6.1 6.2 5.5 8.6  NEUTROABS  --   --   --  6.7  HGB 8.4* 8.7* 8.5* 8.7*  HCT 28.1* 29.0* 28.3* 28.7*  MCV 86.2 87.9 85.5 84.7  PLT 415* 417* 435* 472*   PT/INR: @LABRCNTIP (inr:5) Cardiac Enzymes: )No results for input(s): CKTOTAL, CKMB, CKMBINDEX, TROPONINI in the last 168 hours. CBG: Recent Labs  Lab 10/21/19 1050 10/21/19 1616 10/21/19 2057 10/22/19 0537 10/22/19 1101  GLUCAP 133* 297* 358* 158* 174*    Iron Studies:  Recent Labs  Lab 10/22/19 0244  IRON 36*  TIBC 258  FERRITIN 108    Xrays/Other Studies: No results  found.   Assessment/Plan: 1.  AKI/CKD stage IV- he has had multiple admissions over the past 3 months with acute on chronic CHF and AKI/CKD.  He is followed by Dr. Radene Knee at [facility] and is scheduled to see her again soon.  He is a poor dialysis candidate given his noncompliance, polysubstance abuse, as well as his declining functional status.  At this time he does not want to consider dialysis nor discuss the consequences.  I have nothing further to add to his care and encouraged him to continue to discuss this with Dr. Radene Knee and keep his appointments. 1. Renal dose meds 2. Follow vanc levels closely 3. Not much off from his baseline 4. Agree with Dr. Sherral Hammers that he is a poor longterm dialysis candidate given his ongoing issues with substance abuse, declining functional status, and noncompliance.  Will defer further discussion to his primary nephrologist, Dr. Radene Knee at [facility]. 5. Nothing further to add and will sign off.  Please call with questions or concerns. 2. Diabetic foot ulcer with osteomyelitis- s/p RBKA 10/21/19 performed by Dr. Sharol Given.  Currently on IV rocephin and vanco.  Need to monitor vanc levels closely given his advanced CKD stage IV-V 3. Uncontrolled DM type II- per primary 4. ABLA on anemia of CKD stage IV- transfuse prn. 5. Syncope- presumably due to infection as well as possibly due to cocaine. 6. Chronic diastolic and systolic CHF- stable 7. HTN- stable 8. Polysubstance abuse- + cocaine on admission and + tobacco 9. Disposition- agree with palliative care consult to help set goals/limits of care.   Broadus John A Darnelle Derrick 10/22/2019, 11:28 AM

## 2019-10-22 NOTE — Progress Notes (Signed)
Inpatient Rehabilitation-Admissions Coordinator   CIR consult received. Met with pt bedside for IP Rehab assessment. Pt supine in bed and willing to have a detailed rehab conversation with me. Pt expressed a desire to go to [facility] and go to a rehab center for an extended period of time (he quoted a 6 month period). Discussed alternative plan of rehab here at [facility] for approx 1 week. However, pt was not interested in our rehab program and he also has not been able to confirm any DC plans (pt will not allow me to contact his family). At this time, based on pt preference for DC ASAP to [facility] system and the fact that the patient cannot confirm a safe DC plan, AC will not pursue CIR. AC will sign off. RN, MD, and TOC team aware.   Raechel Ache, OTR/L  Rehab Admissions Coordinator  [phone] 10/22/2019 4:18 PM

## 2019-10-22 NOTE — Progress Notes (Signed)
Inpatient Diabetes Program Recommendations  AACE/ADA: New Consensus Statement on Inpatient Glycemic Control (2015)  Target Ranges:  Prepandial:   less than 140 mg/dL      Peak postprandial:   less than 180 mg/dL (1-2 hours)      Critically ill patients:  140 - 180 mg/dL   Lab Results  Component Value Date   GLUCAP 174 (H) 10/22/2019   HGBA1C 8.8 (H) 10/17/2019    Review of Glycemic Control Results for Zachary Young, Zachary Young (MRN [id number]) as of 10/22/2019 12:20  Ref. Range 10/21/2019 16:16 10/21/2019 20:57 10/22/2019 05:37 10/22/2019 11:01  Glucose-Capillary Latest Ref Range: 70 - 99 mg/dL 297 (H) 358 (H) 158 (H) 174 (H)   Diabetes history: Type 2 DM Outpatient Diabetes medications: Humalog 4 units TID, Lantus 10 units QHS  Current orders for Inpatient glycemic control: Novolog 0-20 units TID, Novolog 0-5 units QHS, Lantus 10 units QHS Decadron 4 mg x 1  Inpatient Diabetes Program Recommendations:    Spoke with patient regarding outpatient diabetes management. Patient denies taking insulin "in a while" due to refrigeration issues. Reviewed patient's current A1c of 8.8%. Explained what a A1c is and what it measures. Also reviewed goal A1c with patient, importance of good glucose control @ home, and blood sugar goals. Reviewed patho of DM, need for insulin, role of pancreas, vascular impact, ESRD with insulin, and commorbidities. Patient will need a meter at discharge. Denies checking CBGs.  Blood glucose meter (inlcudes lancets and strips) ( #[id number]). Encouraged to begin checking atleast 2 times per day, reviewed frequency recommended and when to follow up with PCP. Patient states he will make an appointment.  Denies drinking sugary beverages. States, "I do what I need to do." When asked about taking his insulin, he states, "It hard to take it if you dont have it. I just threw away a bunch because my generator to my trailer stopped working."  Educated patient on insulin pen use at home. Reviewed  contents of insulin flexpen starter kit. Reviewed all steps if insulin pen including attachment of needle, 2-unit air shot, dialing up dose, giving injection, removing needle, disposal of sharps, storage of unused insulin, disposal of insulin etc. Patient able to provide successful return demonstration. Also reviewed troubleshooting with insulin pen. MD to give patient Rxs for insulin pens and insulin pen needles. Secure chat sent to MD.   Thanks, Bronson Curb, MSN, RNC-OB Diabetes Coordinator [phone] (8a-5p)

## 2019-10-22 NOTE — Progress Notes (Signed)
PROGRESS NOTE    Zachary Young  ERD:[id number] DOB: April 14, 1961 DOA: 10/17/2019 PCP: System, Pcp Not In     Brief Narrative:   59 y.o. BM PMHx CKD stage V, DM type II controlled with complication, Diabetic Neuropathy, Chronic systolic and diastolic CHF, HTN,   Presents  with pain who presented after passing out. Zachary Young notes that today, he went out on to his front porch to get some fresh air. Once out there, he became dizzy and had to lean against the house, and then he passed out. He slid down the side of the house and did not hit his head. He noted no chest pain, no fever, no chills, no change in urination. He does note that he has had a decreased appetite for the last 3-4 days, eating only about half of what he normally does. He has passed out before in the past when his sugar drops, and he thinks this may have happened today. He denies chest pain. He has SOB with ambulating at times, but nothing today. He report no change in urination and feels like he is urinating the normal amount. He reports taking torsemide 80mg  in the morning and evening (4 tablets) and not missing doses. He was having issues with pain in bed, and as I looked at his legs, I noted that his right leg was swollen, warm and mildly erythematous compared to the left. When asked, he noted a new wound on the plantar surface, at the 4th and 5th digit which he noticed first about 2 weeks ago.    Subjective: 7/8 A/O x4, pain controlled.  Patient belligerent with nursing staff, cursing at them, and generally being uncooperative.  1 minute patient states interested in CIR offer, then next minute talks about going to a rehab facility at [facility] and staying for 6 months.   Assessment & Plan: Covid vaccination; positive vaccination   Active Problems:   Hypertensive heart disease with CHF (congestive heart failure) (HCC)   Chronic kidney disease, stage 4 (severe) (HCC)   HLD (hyperlipidemia)   HTN (hypertension)    Systolic and diastolic CHF, chronic (HCC)   Tobacco use disorder   Type 2 diabetes mellitus with circulatory disorder, with long-term current use of insulin (HCC)   Wound of right foot   Syncope and collapse   Cocaine abuse ([place])   Subacute osteomyelitis, right ankle and foot (HCC)   Abscess of right foot   Diabetic neuropathy (HCC)   Anemia of chronic disease   Severe obesity (BMI 35.0-39.9) with comorbidity ([place])   Metabolic acidosis   DM (diabetes mellitus), secondary, uncontrolled, with complications (HCC)   Elevated sedimentation rate   Elevated C-reactive protein (CRP)   Hypoalbuminemia   RIGHT foot wound -Plan is for Dr. Meridee Score to perform RIGHT BKA on 7/7 -7/7 s/p RIGHT transtibial amputation -Continue antibiotics per surgery wishes. -7/8 patient now has a brace to form the stump, continues with drain  DM type II uncontrolled with complication -7/3 hemoglobin A1c=8.8 -7/7 consult diabetic coordinator poorly controlled diabetic reeducate patient on diabetes. 7/7 consult diabetic nutrition;poorly controlled diabetic reeducate patient on diabetes. -7/8 patient insisted that he have additional snacks during the night even though it has been explained by numerous personnel that his diabetes is uncontrolled and this will affect his ability to heal. -Lantus 10 units qhs -7/8 NovoLog 8 units qac -Resistant SSI  Syncope and collapse -likely multifactorial including overdiuresis, acute on chronic kidney disease and acute infection - refused telemetry - TTE  from May was very difficult given habitus  Chronic diastolic and systolic CHF -EF 50 to 35% by echocardiogram 08/16/2019 -Strict ins and out +3.2 L -Daily weight Filed Weights   10/20/19 0548 10/21/19 0922 10/22/19 0014  Weight: (!) 136.2 kg (!) 136.2 kg 133.4 kg  -Amlodipine 10 mg daily -Torsemide 40 mg BID  HTN (hypertension) - Continue amlodipine, torsemide as noted above  Acute onCKD, stage 4 (baseline  CR = 3.7---> 4.7) Recent Labs  Lab 10/18/19 0815 10/19/19 0549 10/20/19 0712 10/21/19 0459 10/22/19 0244  CREATININE 5.32* 5.43* 5.13* 4.66* 4.87*  - Trend renal function -7/8 consulted Nephrology Dr. Jane Canary for evaluation of patient's continuing poor renal function.  I do not believe patient is good candidate for HD, but will defer to nephrology.  -Cardiology evaluated patient and concurs that patient is not an HD candidate  Normocytic anemia? Recent Labs  Lab 10/18/19 0815 10/19/19 0549 10/20/19 0712 10/21/19 0459 10/22/19 0244  HGB 8.5* 8.4* 8.7* 8.5* 8.7*  -Anemia panel pending -Occult blood pending -Transfuse for hemoglobin<7  HLD (hyperlipidemia) -5/2 LDL= 50  Tobacco use disorder - Counseling prior to discharge  Cocaine abuse -7/4 urine drug screen positive for cocaine -Patient counseled on cessation  Obese -Body mass index is 37.53 kg/m. -See uncontrolled DM type II  Competent to make medical decision?/Dementia? -Patient is extremely belligerent with staff, and when requested that he exhibits some respect with staff, patient becomes more belligerent. -Patient with EXTREMELY poor understanding of his multiple medical problems.  Believes that he can go to [facility] and stay for 6 months at a rehab facility, at the same time initially interested in CIR. -Per patient lives in a camper without running water or electricity, therefore going home would be an unsafe environment.   -On multiple occasion nursing staff has requested consent to contact his family to help in decision-making for rehab patient has refused to allow staff to speak with family members, and after patient cursed out family members on the phone they are no longer answering his phone calls.   Goals of care -7/7 consult to palliative care;-with history of drug abuse, HTN, uncontrolled diabetes poor candidate for hemodialysis but is a CKD stage V patient.  Discuss changing status to DNR, home  palliative care; addendum palliative care spoke with patient, per the note again patient demonstrated NO UNDERSTANDING of multiple medical problems, and was reluctant to address seriously goals of care. 7/8 consult psychiatry;Patient becomes very combative, with physicians and nursing staff.  In addition does not appear to understand his medical situation to include recent surgery, and lack of living conditions.  Unsure patient able to make his own medical decisions.  Please evaluate; IVC?    DVT prophylaxis: Lovenox Code Status: Full Family Communication: 7/7 family at bedside for discussion of plan of care Status is: Inpatient    Dispo: The patient is from: Home              Anticipated d/c is to: SNF              Anticipated d/c date is: Per surgery              Patient currently unstable      Consultants:  Dr. Meridee Score [facility] orthopedic surgery    Procedures/Significant Events:  7/7 RIGHT Transtibial amputation Application of Prevena wound VAC    I have personally reviewed and interpreted all radiology studies and my findings are as above.  VENTILATOR SETTINGS:    Cultures  7/3 SARS coronavirus negative    Antimicrobials: Anti-infectives (From admission, onward)   Start     Ordered Stop   10/21/19 0600  ceFAZolin (ANCEF) 3 g in dextrose 5 % 50 mL IVPB        10/20/19 1810 10/21/19 1013   10/20/19 1000  vancomycin (VANCOREADY) IVPB 1250 mg/250 mL        10/20/19 0930 10/20/19 1143   10/17/19 2115  cefTRIAXone (ROCEPHIN) 2 g in sodium chloride 0.9 % 100 mL IVPB  Status:  Discontinued        10/17/19 2112 10/22/19 1603   10/17/19 2115  metroNIDAZOLE (FLAGYL) IVPB 500 mg  Status:  Discontinued        10/17/19 2112 10/22/19 1603   10/17/19 2115  vancomycin (VANCOCIN) 2,250 mg in sodium chloride 0.9 % 500 mL IVPB        10/17/19 2110 10/18/19 0209   10/17/19 2110  vancomycin variable dose per unstable renal function (pharmacist dosing)  Status:  Discontinued         10/17/19 2110 10/22/19 1603       Devices    LINES / TUBES:      Continuous Infusions: . cefTRIAXone (ROCEPHIN)  IV 2 g (10/21/19 2117)  . metronidazole 500 mg (10/22/19 0624)     Objective: Vitals:   10/21/19 1823 10/21/19 2012 10/22/19 0014 10/22/19 0415  BP: (!) 168/79 122/74 115/70 108/78  Pulse: 70 84 79 84  Resp: 20 17 17 19   Temp: 98.7 F (37.1 C) 97.9 F (36.6 C) 98.2 F (36.8 C) 98.4 F (36.9 C)  TempSrc: Oral Oral Oral Oral  SpO2:   96%   Weight:   133.4 kg   Height:        Intake/Output Summary (Last 24 hours) at 10/22/2019 0758 Last data filed at 10/21/2019 1923 Gross per 24 hour  Intake 1290 ml  Output 1545 ml  Net -255 ml   Filed Weights   10/20/19 0548 10/21/19 0922 10/22/19 0014  Weight: (!) 136.2 kg (!) 136.2 kg 133.4 kg   Physical Exam:  General: A/O x4, extremely belligerent towards staff cursing and yelling at them.  No acute respiratory distress Eyes: negative scleral hemorrhage, negative anisocoria, negative icterus ENT: Negative Runny nose, negative gingival bleeding, Neck:  Negative scars, masses, torticollis, lymphadenopathy, JVD Lungs: Clear to auscultation bilaterally without wheezes or crackles Cardiovascular: Regular rate and rhythm without murmur gallop or rub normal S1 and S2 Abdomen: negative abdominal pain, nondistended, positive soft, bowel sounds, no rebound, no ascites, no appreciable mass Extremities: LEFT transtarsal amputation well-healed, RIGHT transtarsal amputation covered and clean with in a stump binder, wound VAC drain putting out minimal drainage Skin: Negative rashes, lesions, ulcers\ Psychiatric:  Negative depression, negative anxiety, negative fatigue, negative mania, NO UNDERSTANDING of current multiple medical problems. Central nervous system:  Cranial nerves II through XII intact, tongue/uvula midline, all extremities muscle strength 5/5, sensation intact throughout, negative dysarthria, negative  expressive aphasia, negative receptive aphasia.  .     Data Reviewed: Care during the described time interval was provided by me .  I have reviewed this patient's available data, including medical history, events of note, physical examination, and all test results as part of my evaluation.  CBC: Recent Labs  Lab 10/18/19 0815 10/19/19 0549 10/20/19 0712 10/21/19 0459 10/22/19 0244  WBC 6.4 6.1 6.2 5.5 8.6  NEUTROABS  --   --   --   --  6.7  HGB 8.5* 8.4* 8.7* 8.5*  8.7*  HCT 27.9* 28.1* 29.0* 28.3* 28.7*  MCV 85.1 86.2 87.9 85.5 84.7  PLT 392 415* 417* 435* 983*   Basic Metabolic Panel: Recent Labs  Lab 10/18/19 0815 10/19/19 0549 10/20/19 0712 10/21/19 0459 10/22/19 0244  NA 139 142 141 140 136  K 3.8 4.0 4.3 4.3 4.9  CL 106 111 107 104 103  CO2 20* 19* 19* 23 22  GLUCOSE 218* 150* 181* 179* 207*  BUN 88* 85* 83* 91* 93*  CREATININE 5.32* 5.43* 5.13* 4.66* 4.87*  CALCIUM 7.3* 7.7* 8.2* 8.9 9.2  MG  --   --   --   --  1.8  PHOS  --   --   --   --  5.7*   GFR: Estimated Creatinine Clearance: 24.6 mL/min (A) (by C-G formula based on SCr of 4.87 mg/dL (H)). Liver Function Tests: Recent Labs  Lab 10/18/19 0815 10/22/19 0244  AST 11* 12*  ALT 10 7  ALKPHOS 84 88  BILITOT 0.5 0.5  PROT 8.1 8.1  ALBUMIN 2.3* 2.5*   No results for input(s): LIPASE, AMYLASE in the last 168 hours. No results for input(s): AMMONIA in the last 168 hours. Coagulation Profile: No results for input(s): INR, PROTIME in the last 168 hours. Cardiac Enzymes: No results for input(s): CKTOTAL, CKMB, CKMBINDEX, TROPONINI in the last 168 hours. BNP (last 3 results) No results for input(s): PROBNP in the last 8760 hours. HbA1C: No results for input(s): HGBA1C in the last 72 hours. CBG: Recent Labs  Lab 10/21/19 0931 10/21/19 1050 10/21/19 1616 10/21/19 2057 10/22/19 0537  GLUCAP 136* 133* 297* 358* 158*   Lipid Profile: No results for input(s): CHOL, HDL, LDLCALC, TRIG, CHOLHDL,  LDLDIRECT in the last 72 hours. Thyroid Function Tests: No results for input(s): TSH, T4TOTAL, FREET4, T3FREE, THYROIDAB in the last 72 hours. Anemia Panel: Recent Labs    10/22/19 0244  VITAMINB12 708  FOLATE 8.2  FERRITIN 108  TIBC 258  IRON 36*  RETICCTPCT 1.2   Sepsis Labs: No results for input(s): PROCALCITON, LATICACIDVEN in the last 168 hours.  Recent Results (from the past 240 hour(s))  SARS Coronavirus 2 by RT PCR (hospital order, performed in [facility] hospital lab) Nasopharyngeal Nasopharyngeal Swab     Status: None   Collection Time: 10/17/19  6:49 PM   Specimen: Nasopharyngeal Swab  Result Value Ref Range Status   SARS Coronavirus 2 NEGATIVE NEGATIVE Final    Comment: (NOTE) SARS-CoV-2 target nucleic acids are NOT DETECTED.  The SARS-CoV-2 RNA is generally detectable in upper and lower respiratory specimens during the acute phase of infection. The lowest concentration of SARS-CoV-2 viral copies this assay can detect is 250 copies / mL. A negative result does not preclude SARS-CoV-2 infection and should not be used as the sole basis for treatment or other patient management decisions.  A negative result may occur with improper specimen collection / handling, submission of specimen other than nasopharyngeal swab, presence of viral mutation(s) within the areas targeted by this assay, and inadequate number of viral copies (<250 copies / mL). A negative result must be combined with clinical observations, patient history, and epidemiological information.  Fact Sheet for Patients:   StrictlyIdeas.no  Fact Sheet for Healthcare Providers: BankingDealers.co.za  This test is not yet approved or  cleared by the [place] FDA and has been authorized for detection and/or diagnosis of SARS-CoV-2 by FDA under an Emergency Use Authorization (EUA).  This EUA will remain in effect (meaning this test  can be used) for the  duration of the COVID-19 declaration under Section 564(b)(1) of the Act, 21 U.S.C. section 360bbb-3(b)(1), unless the authorization is terminated or revoked sooner.  Performed at [facility], [place] [address], [place], [place] [address]          Radiology Studies: No results found.      Scheduled Meds: . amLODipine  10 mg Oral Daily  . aspirin EC  81 mg Oral Daily  . collagenase   Topical Daily  . docusate sodium  100 mg Oral BID  . enoxaparin (LOVENOX) injection  30 mg Subcutaneous Q24H  . gabapentin  300 mg Oral Daily   And  . gabapentin  600 mg Oral QHS  . insulin aspart  0-20 Units Subcutaneous TID WC  . insulin aspart  0-5 Units Subcutaneous QHS  . insulin glargine  10 Units Subcutaneous QHS  . multivitamin with minerals  1 tablet Oral Daily  . nutrition supplement (JUVEN)  1 packet Oral BID BM  . Ensure Max Protein  11 oz Oral TID  . sodium chloride flush  3 mL Intravenous Q12H  . torsemide  40 mg Oral BID  . vancomycin variable dose per unstable renal function (pharmacist dosing)   Does not apply See admin instructions   Continuous Infusions: . cefTRIAXone (ROCEPHIN)  IV 2 g (10/21/19 2117)  . metronidazole 500 mg (10/22/19 0624)     LOS: 5 days    Time spent:40 min    Cru Kritikos, Geraldo Docker, MD [facility] Pager [phone]  If 7PM-7AM, please contact night-coverage [email or web address] Password [facility] 10/22/2019, 7:58 AM

## 2019-10-22 NOTE — Evaluation (Signed)
Physical Therapy Evaluation Patient Details Name: Zachary Young MRN: [id number] DOB: Sep 18, 1960 Today's Date: 10/22/2019   History of Present Illness  Pt is a 59 y/o male s/p R transtibial amputation. PMH including but not limited to CHF, DM, HTN.  Clinical Impression  Pt presented sitting OOB in recliner chair, awake and willing to participate in therapy session. Prior to admission, pt reported that he ambulated with either use of a cane or RW and was independent with ADLs. However, per chart review, pt requiring assistance with ADLs/IADLs on last admission. At the time of evaluation, pt required mod A to perform sit<>stand transfers with RW. He was unable to take any hops on his L LE today; however, was able to stand with RW and min guard for safety for approximately 2 minutes. Discussed the importance of maintaining knee extension and protecting his residual limb in preparation for eventually receiving a prosthesis. Additionally discussed pt's need for rehab and further therapy services prior to returning home. Pt agreeable to CIR but requesting to be sent to [facility].   Pt would continue to benefit from skilled physical therapy services at this time while admitted and after d/c to address the below listed limitations in order to improve overall safety and independence with functional mobility.     Follow Up Recommendations CIR (wants to go to [facility] inpatient rehab - apparently he's been there before?)    Equipment Recommendations  None recommended by PT    Recommendations for Other Services       Precautions / Restrictions Precautions Precautions: Fall Precaution Comments: wound VAC Required Braces or Orthoses: Other Brace Other Brace: R limb guard Restrictions Weight Bearing Restrictions: Yes RUE Weight Bearing: Non weight bearing Other Position/Activity Restrictions: NWB R LE      Mobility  Bed Mobility Overal bed mobility: Modified Independent              General bed mobility comments: pt OOB in recliner chair upon arrival  Transfers Overall transfer level: Needs assistance Equipment used: Rolling walker (2 wheeled) Transfers: Sit to/from Stand Sit to Stand: +2 safety/equipment;Mod assist        Lateral/Scoot Transfers: Min guard General transfer comment: cueing for safe hand placement and technique, very slow to rise fully into standing, mod A needed to achieve upright position  Ambulation/Gait             General Gait Details: unable  Stairs            Wheelchair Mobility    Modified Rankin (Stroke Patients Only)       Balance Overall balance assessment: Needs assistance Sitting-balance support: No upper extremity supported;Feet supported Sitting balance-Leahy Scale: Good     Standing balance support: Bilateral upper extremity supported Standing balance-Leahy Scale: Poor Standing balance comment: reliant on bilateral UEs on RW                             Pertinent Vitals/Pain Pain Assessment: Faces Pain Score: 10-Worst pain ever Faces Pain Scale: Hurts little more Pain Location: RLE Pain Descriptors / Indicators: Sore;Constant Pain Intervention(s): Monitored during session;Repositioned    Home Living Family/patient expects to be discharged to:: Unsure Living Arrangements: Alone Available Help at Discharge: Family Type of Home: Mobile home Home Access: Stairs to enter   Technical brewer of Steps: 2 Home Layout: One level Home Equipment: Cane - single point Additional Comments: Pt reports living home alone, reports he may d/c  to sister's home but this plan is not set    Prior Function Level of Independence: Needs assistance   Gait / Transfers Assistance Needed: walks household distances with cane  ADL's / Homemaking Assistance Needed: pt reports he was independent with ADL;per chart hx pt required assistance for bathing and dressing, family was assisting with IADL  Comments:  difficult to achieve PLOF as pt was highly distractable by TV despite therapist turning it off, pt continuously turning it back on     Hand Dominance   Dominant Hand: Right    Extremity/Trunk Assessment   Upper Extremity Assessment Upper Extremity Assessment: Defer to OT evaluation;Overall WFL for tasks assessed    Lower Extremity Assessment Lower Extremity Assessment: RLE deficits/detail RLE Deficits / Details: wound VAC and limb protector in place; s/p transtibial amputation    Cervical / Trunk Assessment Cervical / Trunk Assessment: Normal  Communication   Communication: No difficulties  Cognition Arousal/Alertness: Awake/alert Behavior During Therapy: Flat affect Overall Cognitive Status: Impaired/Different from baseline Area of Impairment: Following commands;Safety/judgement;Problem solving                       Following Commands: Follows one step commands with increased time;Follows multi-step commands with increased time Safety/Judgement: Decreased awareness of deficits;Decreased awareness of safety   Problem Solving: Difficulty sequencing;Requires verbal cues General Comments: pt with tangential thinking, appeared to have difficulty following along with conversation;pt highly distractable by TV, difficult to thoroughly assess;pt crass during session but appropriate with therapist      General Comments General comments (skin integrity, edema, etc.): vss    Exercises Other Exercises Other Exercises: began education on phantom limb pain and sensation   Assessment/Plan    PT Assessment Patient needs continued PT services  PT Problem List Decreased strength;Decreased range of motion;Decreased activity tolerance;Decreased balance;Decreased mobility;Decreased coordination;Decreased knowledge of use of DME;Decreased knowledge of precautions;Decreased safety awareness;Pain       PT Treatment Interventions DME instruction;Gait training;Functional mobility  training;Therapeutic activities;Therapeutic exercise;Balance training;Neuromuscular re-education;Patient/family education    PT Goals (Current goals can be found in the Care Plan section)  Acute Rehab PT Goals Patient Stated Goal: to go home PT Goal Formulation: With patient Time For Goal Achievement: 11/05/19 Potential to Achieve Goals: Good    Frequency Min 4X/week   Barriers to discharge        Co-evaluation               AM-PAC PT "6 Clicks" Mobility  Outcome Measure Help needed turning from your back to your side while in a flat bed without using bedrails?: None Help needed moving from lying on your back to sitting on the side of a flat bed without using bedrails?: None Help needed moving to and from a bed to a chair (including a wheelchair)?: A Lot Help needed standing up from a chair using your arms (e.g., wheelchair or bedside chair)?: A Lot Help needed to walk in hospital room?: A Lot Help needed climbing 3-5 steps with a railing? : Total 6 Click Score: 15    End of Session Equipment Utilized During Treatment: Gait belt Activity Tolerance: Patient tolerated treatment well Patient left: in chair;with call bell/phone within reach;with chair alarm set Nurse Communication: Mobility status PT Visit Diagnosis: Other abnormalities of gait and mobility (R26.89);Pain Pain - Right/Left: Right Pain - part of body: Leg    Time: 1610-9604 PT Time Calculation (min) (ACUTE ONLY): 17 min   Charges:   PT Evaluation $PT Eval  Moderate Complexity: 1 Mod          Eduard Clos, PT, DPT  Acute Rehabilitation Services Pager [phone] Office [place] 10/22/2019, 1:00 PM

## 2019-10-23 DIAGNOSIS — R55 Syncope and collapse: Secondary | ICD-10-CM | POA: Diagnosis not present

## 2019-10-23 DIAGNOSIS — L02611 Cutaneous abscess of right foot: Secondary | ICD-10-CM | POA: Diagnosis not present

## 2019-10-23 DIAGNOSIS — F1410 Cocaine abuse, uncomplicated: Secondary | ICD-10-CM | POA: Diagnosis not present

## 2019-10-23 DIAGNOSIS — E138 Other specified diabetes mellitus with unspecified complications: Secondary | ICD-10-CM | POA: Diagnosis not present

## 2019-10-23 LAB — MAGNESIUM: Magnesium: 2 mg/dL (ref 1.7–2.4)

## 2019-10-23 LAB — COMPREHENSIVE METABOLIC PANEL
ALT: 6 U/L (ref 0–44)
AST: 14 U/L — ABNORMAL LOW (ref 15–41)
Albumin: 2.6 g/dL — ABNORMAL LOW (ref 3.5–5.0)
Alkaline Phosphatase: 76 U/L (ref 38–126)
Anion gap: 14 (ref 5–15)
BUN: 107 mg/dL — ABNORMAL HIGH (ref 6–20)
CO2: 21 mmol/L — ABNORMAL LOW (ref 22–32)
Calcium: 9.4 mg/dL (ref 8.9–10.3)
Chloride: 101 mmol/L (ref 98–111)
Creatinine, Ser: 5.2 mg/dL — ABNORMAL HIGH (ref 0.61–1.24)
GFR calc Af Amer: 13 mL/min — ABNORMAL LOW (ref >60)
GFR calc non Af Amer: 11 mL/min — ABNORMAL LOW (ref >60)
Glucose, Bld: 148 mg/dL — ABNORMAL HIGH (ref 70–99)
Potassium: 4.6 mmol/L (ref 3.5–5.1)
Sodium: 136 mmol/L (ref 135–145)
Total Bilirubin: 0.5 mg/dL (ref 0.3–1.2)
Total Protein: 8.1 g/dL (ref 6.5–8.1)

## 2019-10-23 LAB — CBC WITH DIFFERENTIAL/PLATELET
Abs Immature Granulocytes: 0.06 10*3/uL (ref 0.00–0.07)
Basophils Absolute: 0 10*3/uL (ref 0.0–0.1)
Basophils Relative: 0 %
Eosinophils Absolute: 0.2 10*3/uL (ref 0.0–0.5)
Eosinophils Relative: 2 %
HCT: 29.1 % — ABNORMAL LOW (ref 39.0–52.0)
Hemoglobin: 8.8 g/dL — ABNORMAL LOW (ref 13.0–17.0)
Immature Granulocytes: 1 %
Lymphocytes Relative: 33 %
Lymphs Abs: 2.6 10*3/uL (ref 0.7–4.0)
MCH: 25.6 pg — ABNORMAL LOW (ref 26.0–34.0)
MCHC: 30.2 g/dL (ref 30.0–36.0)
MCV: 84.6 fL (ref 80.0–100.0)
Monocytes Absolute: 0.8 10*3/uL (ref 0.1–1.0)
Monocytes Relative: 10 %
Neutro Abs: 4.4 10*3/uL (ref 1.7–7.7)
Neutrophils Relative %: 54 %
Platelets: 410 10*3/uL — ABNORMAL HIGH (ref 150–400)
RBC: 3.44 MIL/uL — ABNORMAL LOW (ref 4.22–5.81)
RDW: 14.7 % (ref 11.5–15.5)
WBC: 8 10*3/uL (ref 4.0–10.5)
nRBC: 0 % (ref 0.0–0.2)

## 2019-10-23 LAB — GLUCOSE, CAPILLARY
Glucose-Capillary: 136 mg/dL — ABNORMAL HIGH (ref 70–99)
Glucose-Capillary: 213 mg/dL — ABNORMAL HIGH (ref 70–99)
Glucose-Capillary: 187 mg/dL — ABNORMAL HIGH (ref 70–99)
Glucose-Capillary: 201 mg/dL — ABNORMAL HIGH (ref 70–99)

## 2019-10-23 LAB — SURGICAL PATHOLOGY

## 2019-10-23 LAB — PHOSPHORUS: Phosphorus: 7.2 mg/dL — ABNORMAL HIGH (ref 2.5–4.6)

## 2019-10-23 NOTE — Care Management (Addendum)
Call [facility] for [place], [place], [place] [address]  Phone [phone] and left a voicemail with admissions. Awaiting call back.    [place] with [facility] returned call. Jocelyn Lamer requesting clinicals be fax to her at [phone] for review.  [name] RN

## 2019-10-23 NOTE — Consult Note (Signed)
[facility] Psychiatry Consult   Reason for Consult:  "Patient becomes very combative, with physicians and nursing staff. In addition does not appear to understand his medical situation to include recent surgery, and lack of living conditions. Unsure patient able to make his own medical decisions. Please evaluate; IVC?" Referring Physician:  Dr. Sherral Hammers Patient Identification: Zachary Young MRN:  [id number] Principal Diagnosis: <principal problem not specified> Diagnosis:  Active Problems:   Hypertensive heart disease with CHF (congestive heart failure) (HCC)   Chronic kidney disease, stage 4 (severe) (HCC)   HLD (hyperlipidemia)   HTN (hypertension)   Systolic and diastolic CHF, chronic (HCC)   Tobacco use disorder   Type 2 diabetes mellitus with circulatory disorder, with long-term current use of insulin (HCC)   Wound of right foot   Syncope and collapse   Cocaine abuse ([place])   Subacute osteomyelitis, right ankle and foot (HCC)   Abscess of right foot   Diabetic neuropathy (HCC)   Anemia of chronic disease   Severe obesity (BMI 35.0-39.9) with comorbidity ([place])   Metabolic acidosis   DM (diabetes mellitus), secondary, uncontrolled, with complications (HCC)   Elevated sedimentation rate   Elevated C-reactive protein (CRP)   Hypoalbuminemia   Goals of care, counseling/discussion   Palliative care by specialist   Total Time spent with patient: 30 minutes  Subjective:   Patient states "all my doctors are [facility] and my Medicaid covers [facility] I am trying to get there because I do not have money to pay out-of-pocket here."  HPI: Zachary Young is a 59 y.o. male patient admitted with right below the knee amputation. Patient assessed by nurse practitioner.  Patient alert and oriented.  Patient participates in assessment and exhibits irritable mood.  Per patient medical record patient has been combative with hospital staff during this admission. Patient patient  reports plan to "go to [facility] my insurance will cover it, I know that I need nursing care." While patient does appear to recognize need for continued care, patient appears impulsive when discussing disposition planning. Patient ruminates regarding cost of treatment.  Patient states "put me in a wheelchair and rolled me to the street my brother will pick me up." Patient reports he lives alone in [place].  Patient denies access to weapons.  Patient reports he is currently unemployed.  Patient denies substance use.  Patient reports history of alcohol use disorder, reports last drink approximately "11 months ago."  Patient gives verbal consent to speak with his sister, Zachary Young phone number [phone]. Patient sister verbalizes concern for patient's decision-making and overall well-being.  Patient sister states "he will not listen to anybody and he is set in his ways."  Patient sister reports concerns for patient's living conditions.  Patient sister reports patient currently resides in a camper that has only a generator for power and lacks running water. Patient sister also has concerns regarding patient's finances, states "he drinks and does other things and believes he has his family to fall back on."  Patient sister reports he would be unable to reside with her related to "many stairs in the home."     Past Psychiatric History: Cocaine Use Disorder   Risk to Self:   Denies Risk to Others:   Denies Prior Inpatient Therapy:   None reported Prior Outpatient Therapy:    None reported  Past Medical History:  Past Medical History:  Diagnosis Date  . CHF (congestive heart failure) ([place])   . Diabetes mellitus without complication ([place])   .  Peripheral edema   . Renal disorder    kidney disease    Past Surgical History:  Procedure Laterality Date  . AMPUTATION Right 10/21/2019   Procedure: RIGHT BELOW KNEE AMPUTATION;  Surgeon: Newt Minion, MD;  Location: [facility];  Service:  Orthopedics;  Laterality: Right;  . TOE AMPUTATION Bilateral    due to osteomyelitis, all 5 each foot   Family History:  Family History  Problem Relation Age of Onset  . Cancer Mother    Family Psychiatric  History: None reported Social History:  Social History   Substance and Sexual Activity  Alcohol Use Not Currently     Social History   Substance and Sexual Activity  Drug Use Yes  . Types: Cocaine    Social History   Socioeconomic History  . Marital status: Single    Spouse name: Not on file  . Number of children: Not on file  . Years of education: Not on file  . Highest education level: Not on file  Occupational History  . Not on file  Tobacco Use  . Smoking status: Current Every Day Smoker    Packs/day: 0.50    Types: Cigarettes  . Smokeless tobacco: Never Used  Vaping Use  . Vaping Use: Never used  Substance and Sexual Activity  . Alcohol use: Not Currently  . Drug use: Yes    Types: Cocaine  . Sexual activity: Not on file  Other Topics Concern  . Not on file  Social History Narrative  . Not on file   Social Determinants of Health   Financial Resource Strain:   . Difficulty of Paying Living Expenses:   Food Insecurity:   . Worried About Charity fundraiser in the Last Year:   . Arboriculturist in the Last Year:   Transportation Needs:   . Film/video editor (Medical):   Marland Kitchen Lack of Transportation (Non-Medical):   Physical Activity:   . Days of Exercise per Week:   . Minutes of Exercise per Session:   Stress:   . Feeling of Stress :   Social Connections:   . Frequency of Communication with Friends and Family:   . Frequency of Social Gatherings with Friends and Family:   . Attends Religious Services:   . Active Member of Clubs or Organizations:   . Attends Archivist Meetings:   Marland Kitchen Marital Status:    Additional Social History:    Allergies:   Allergies  Allergen Reactions  . Bee Venom Anaphylaxis    Per pt, nearly died from  bee sting as a child  . Propofol Other (See Comments)     Hallucinations  . Eggs Or Egg-Derived Products Nausea And Vomiting and Other (See Comments)    Stomach cramps  . Morphine Nausea And Vomiting  . Oxycodone Nausea And Vomiting    Labs:  Results for orders placed or performed during the hospital encounter of 10/17/19 (from the past 48 hour(s))  Glucose, capillary     Status: Abnormal   Collection Time: 10/21/19  9:31 AM  Result Value Ref Range   Glucose-Capillary 136 (H) 70 - 99 mg/dL    Comment: Glucose reference range applies only to samples taken after fasting for at least 8 hours.  Glucose, capillary     Status: Abnormal   Collection Time: 10/21/19 10:50 AM  Result Value Ref Range   Glucose-Capillary 133 (H) 70 - 99 mg/dL    Comment: Glucose reference range applies only to  samples taken after fasting for at least 8 hours.  Glucose, capillary     Status: Abnormal   Collection Time: 10/21/19  4:16 PM  Result Value Ref Range   Glucose-Capillary 297 (H) 70 - 99 mg/dL    Comment: Glucose reference range applies only to samples taken after fasting for at least 8 hours.  Glucose, capillary     Status: Abnormal   Collection Time: 10/21/19  8:57 PM  Result Value Ref Range   Glucose-Capillary 358 (H) 70 - 99 mg/dL    Comment: Glucose reference range applies only to samples taken after fasting for at least 8 hours.  Comprehensive metabolic panel     Status: Abnormal   Collection Time: 10/22/19  2:44 AM  Result Value Ref Range   Sodium 136 135 - 145 mmol/L   Potassium 4.9 3.5 - 5.1 mmol/L   Chloride 103 98 - 111 mmol/L   CO2 22 22 - 32 mmol/L   Glucose, Bld 207 (H) 70 - 99 mg/dL    Comment: Glucose reference range applies only to samples taken after fasting for at least 8 hours.   BUN 93 (H) 6 - 20 mg/dL   Creatinine, Ser 4.87 (H) 0.61 - 1.24 mg/dL   Calcium 9.2 8.9 - 10.3 mg/dL   Total Protein 8.1 6.5 - 8.1 g/dL   Albumin 2.5 (L) 3.5 - 5.0 g/dL   AST 12 (L) 15 - 41 U/L    ALT 7 0 - 44 U/L   Alkaline Phosphatase 88 38 - 126 U/L   Total Bilirubin 0.5 0.3 - 1.2 mg/dL   GFR calc non Af Amer 12 (L) >60 mL/min   GFR calc Af Amer 14 (L) >60 mL/min   Anion gap 11 5 - 15    Comment: Performed at [facility] [address], [place], [place] [address]  Magnesium     Status: None   Collection Time: 10/22/19  2:44 AM  Result Value Ref Range   Magnesium 1.8 1.7 - 2.4 mg/dL    Comment: Performed at [name] [address], [place], [place] [address]  CBC with Differential/Platelet     Status: Abnormal   Collection Time: 10/22/19  2:44 AM  Result Value Ref Range   WBC 8.6 4.0 - 10.5 K/uL   RBC 3.39 (L) 4.22 - 5.81 MIL/uL   Hemoglobin 8.7 (L) 13.0 - 17.0 g/dL   HCT 28.7 (L) 39 - 52 %   MCV 84.7 80.0 - 100.0 fL   MCH 25.7 (L) 26.0 - 34.0 pg   MCHC 30.3 30.0 - 36.0 g/dL   RDW 14.6 11.5 - 15.5 %   Platelets 472 (H) 150 - 400 K/uL   nRBC 0.0 0.0 - 0.2 %   Neutrophils Relative % 77 %   Neutro Abs 6.7 1.7 - 7.7 K/uL   Lymphocytes Relative 16 %   Lymphs Abs 1.3 0.7 - 4.0 K/uL   Monocytes Relative 6 %   Monocytes Absolute 0.5 0 - 1 K/uL   Eosinophils Relative 0 %   Eosinophils Absolute 0.0 0 - 0 K/uL   Basophils Relative 0 %   Basophils Absolute 0.0 0 - 0 K/uL   Immature Granulocytes 1 %   Abs Immature Granulocytes 0.04 0.00 - 0.07 K/uL    Comment: Performed at [facility], [place] [address], [place], [place] [address]  Phosphorus     Status: Abnormal   Collection Time: 10/22/19  2:44 AM  Result Value Ref Range   Phosphorus 5.7 (H) 2.5 - 4.6 mg/dL    Comment: Performed at [facility] [address], [place], [place] [address]  Vitamin B12     Status: None   Collection Time: 10/22/19  2:44 AM  Result Value Ref Range   Vitamin B-12 708 180 - 914 pg/mL    Comment: (NOTE) This assay is not validated for testing neonatal or myeloproliferative syndrome specimens for Vitamin B12 levels. Performed at [facility], [place] [address] [place], [place] [address]   Folate     Status: None   Collection Time: 10/22/19  2:44 AM  Result Value Ref Range   Folate 8.2 >5.9 ng/mL    Comment: Performed at [facility] [address], [place], [place] [address]  Iron and TIBC     Status: Abnormal   Collection Time: 10/22/19  2:44 AM  Result Value Ref Range   Iron 36 (L) 45 - 182 ug/dL   TIBC 258 250 - 450 ug/dL   Saturation Ratios 14 (L) 17.9 - 39.5 %   UIBC 222 ug/dL    Comment: Performed at [facility], [place] [address] [place], [place] [address]  Ferritin     Status: None   Collection Time: 10/22/19  2:44 AM  Result Value Ref Range   Ferritin 108 24 - 336 ng/mL    Comment: Performed at [facility] [address], [place], [place] [address]  Reticulocytes     Status: Abnormal   Collection Time: 10/22/19  2:44 AM  Result Value Ref Range   Retic Ct Pct 1.2 0.4 - 3.1 %   RBC. 3.35 (L) 4.22 - 5.81 MIL/uL   Retic Count, Absolute 39.9 19.0 - 186.0 K/uL   Immature Retic Fract 12.3 2.3 - 15.9 %    Comment: Performed at [facility] [address], [place], [place] [address]  Glucose, capillary     Status: Abnormal   Collection Time: 10/22/19  5:37 AM  Result Value Ref Range   Glucose-Capillary 158 (H) 70 - 99 mg/dL    Comment: Glucose reference range applies only to samples taken after fasting for at least 8 hours.  Glucose, capillary     Status: Abnormal   Collection Time: 10/22/19 11:01 AM  Result Value Ref Range   Glucose-Capillary 174 (H) 70 - 99 mg/dL    Comment: Glucose reference range applies only to samples taken after fasting for at least 8 hours.  Glucose, capillary     Status: Abnormal   Collection Time: 10/22/19  4:06 PM  Result Value Ref Range   Glucose-Capillary 252 (H) 70 - 99 mg/dL    Comment: Glucose reference range applies only to samples taken after fasting for at least 8 hours.  Glucose, capillary     Status: Abnormal   Collection Time: 10/22/19  9:54 PM  Result Value Ref  Range   Glucose-Capillary 206 (H) 70 - 99 mg/dL    Comment: Glucose reference range applies only to samples taken after fasting for at least 8 hours.  Comprehensive metabolic panel     Status: Abnormal   Collection Time: 10/23/19  5:09 AM  Result Value Ref Range   Sodium 136 135 - 145 mmol/L   Potassium 4.6 3.5 - 5.1 mmol/L   Chloride 101 98 - 111 mmol/L   CO2 21 (L) 22 - 32 mmol/L   Glucose, Bld 148 (H) 70 -  99 mg/dL    Comment: Glucose reference range applies only to samples taken after fasting for at least 8 hours.   BUN 107 (H) 6 - 20 mg/dL   Creatinine, Ser 5.20 (H) 0.61 - 1.24 mg/dL   Calcium 9.4 8.9 - 10.3 mg/dL   Total Protein 8.1 6.5 - 8.1 g/dL   Albumin 2.6 (L) 3.5 - 5.0 g/dL   AST 14 (L) 15 - 41 U/L   ALT 6 0 - 44 U/L   Alkaline Phosphatase 76 38 - 126 U/L   Total Bilirubin 0.5 0.3 - 1.2 mg/dL   GFR calc non Af Amer 11 (L) >60 mL/min   GFR calc Af Amer 13 (L) >60 mL/min   Anion gap 14 5 - 15    Comment: Performed at [facility] [address], [place], [place] [address]  Magnesium     Status: None   Collection Time: 10/23/19  5:09 AM  Result Value Ref Range   Magnesium 2.0 1.7 - 2.4 mg/dL    Comment: Performed at [facility] [address] [place], [place] [address]  CBC with Differential/Platelet     Status: Abnormal   Collection Time: 10/23/19  5:09 AM  Result Value Ref Range   WBC 8.0 4.0 - 10.5 K/uL   RBC 3.44 (L) 4.22 - 5.81 MIL/uL   Hemoglobin 8.8 (L) 13.0 - 17.0 g/dL   HCT 29.1 (L) 39 - 52 %   MCV 84.6 80.0 - 100.0 fL   MCH 25.6 (L) 26.0 - 34.0 pg   MCHC 30.2 30.0 - 36.0 g/dL   RDW 14.7 11.5 - 15.5 %   Platelets 410 (H) 150 - 400 K/uL   nRBC 0.0 0.0 - 0.2 %   Neutrophils Relative % 54 %   Neutro Abs 4.4 1.7 - 7.7 K/uL   Lymphocytes Relative 33 %   Lymphs Abs 2.6 0.7 - 4.0 K/uL   Monocytes Relative 10 %   Monocytes Absolute 0.8 0 - 1 K/uL   Eosinophils Relative 2 %   Eosinophils Absolute 0.2 0 - 0 K/uL   Basophils Relative 0 %    Basophils Absolute 0.0 0 - 0 K/uL   Immature Granulocytes 1 %   Abs Immature Granulocytes 0.06 0.00 - 0.07 K/uL    Comment: Performed at [facility], [place] [address], [place], [place] [address]  Phosphorus     Status: Abnormal   Collection Time: 10/23/19  5:09 AM  Result Value Ref Range   Phosphorus 7.2 (H) 2.5 - 4.6 mg/dL    Comment: Performed at [facility] [address], [place], [place] [address]  Glucose, capillary     Status: Abnormal   Collection Time: 10/23/19  5:32 AM  Result Value Ref Range   Glucose-Capillary 136 (H) 70 - 99 mg/dL    Comment: Glucose reference range applies only to samples taken after fasting for at least 8 hours.    Current Facility-Administered Medications  Medication Dose Route Frequency Provider Last Rate Last Admin  . albuterol (PROVENTIL) (2.5 MG/3ML) 0.083% nebulizer solution 2.5 mg  2.5 mg Nebulization Q4H PRN Persons, Bevely Palmer, PA      . amLODipine (NORVASC) tablet 10 mg  10 mg Oral Daily Persons, Bevely Palmer, [place]   10 mg at 10/23/19 0809  . aspirin EC tablet 81 mg  81 mg Oral Daily Persons, Bevely Palmer, [place]   81 mg at 10/23/19 8938  . collagenase (SANTYL) ointment  Topical Daily Persons, Bevely Palmer, [place]   Given at 10/20/19 0805  . docusate sodium (COLACE) capsule 100 mg  100 mg Oral BID Persons, Bevely Palmer, PA   100 mg at 10/22/19 2154  . enoxaparin (LOVENOX) injection 30 mg  30 mg Subcutaneous Q24H Persons, Bevely Palmer, PA   30 mg at 10/22/19 2201  . gabapentin (NEURONTIN) capsule 300 mg  300 mg Oral Daily Persons, Bevely Palmer, [place]   300 mg at 10/23/19 8341   And  . gabapentin (NEURONTIN) capsule 600 mg  600 mg Oral QHS Persons, Bevely Palmer, PA   600 mg at 10/22/19 2153  . HYDROmorphone (DILAUDID) injection 0.5-1 mg  0.5-1 mg Intravenous Q2H PRN Persons, Bevely Palmer, PA   1 mg at 10/22/19 1349  . hydrOXYzine (ATARAX/VISTARIL) tablet 25 mg  25 mg Oral Q6H PRN Persons, Bevely Palmer, PA   25 mg at 10/18/19 1225  . insulin aspart (novoLOG) injection 0-20  Units  0-20 Units Subcutaneous TID WC Persons, Bevely Palmer, [place]   3 Units at 10/23/19 [phone]  . insulin aspart (novoLOG) injection 0-5 Units  0-5 Units Subcutaneous QHS Persons, Bevely Palmer, [place]   2 Units at 10/22/19 2155  . insulin aspart (novoLOG) injection 8 Units  8 Units Subcutaneous TID WC Allie Bossier, MD   8 Units at 10/23/19 [phone]  . insulin glargine (LANTUS) injection 10 Units  10 Units Subcutaneous QHS Persons, Bevely Palmer, [place]   10 Units at 10/22/19 2154  . metoCLOPramide (REGLAN) tablet 5-10 mg  5-10 mg Oral Q8H PRN Persons, Bevely Palmer, PA       Or  . metoCLOPramide (REGLAN) injection 5-10 mg  5-10 mg Intravenous Q8H PRN Persons, Bevely Palmer, PA      . multivitamin with minerals tablet 1 tablet  1 tablet Oral Daily Persons, Bevely Palmer, [place]   1 tablet at 10/23/19 0808  . nutrition supplement (JUVEN) (JUVEN) powder packet 1 packet  1 packet Oral BID BM Persons, Bevely Palmer, [place]   1 packet at 10/23/19 0818  . ondansetron (ZOFRAN) tablet 4 mg  4 mg Oral Q6H PRN Persons, Bevely Palmer, PA       Or  . ondansetron [facility]) injection 4 mg  4 mg Intravenous Q6H PRN Persons, Bevely Palmer, PA      . oxyCODONE-acetaminophen (PERCOCET/ROXICET) 5-325 MG per tablet 1-2 tablet  1-2 tablet Oral Q6H PRN Persons, Bevely Palmer, [place]   2 tablet at 10/23/19 [phone]  . protein supplement (ENSURE MAX) liquid  11 oz Oral BID Allie Bossier, MD   11 oz at 10/23/19 0818  . sodium chloride flush (NS) 0.9 % injection 3 mL  3 mL Intravenous Q12H Persons, Bevely Palmer, PA   3 mL at 10/23/19 0812  . torsemide (DEMADEX) tablet 40 mg  40 mg Oral BID Persons, Bevely Palmer, PA   40 mg at 10/23/19 0809    Musculoskeletal: Strength & Muscle Tone: within normal limits Gait & Station: unable to assess Patient leans: N/A  Psychiatric Specialty Exam: Physical Exam Vitals and nursing note reviewed.  Constitutional:      Appearance: He is well-developed.  HENT:     Head: Normocephalic.  Cardiovascular:     Rate and Rhythm: Normal rate.  Pulmonary:      Effort: Pulmonary effort is normal.  Neurological:     Mental Status: He is alert and oriented to person, place, and time.  Psychiatric:        Attention and Perception: Attention  and perception normal.        Mood and Affect: Mood normal. Affect is labile.        Speech: Speech normal.        Behavior: Behavior normal. Behavior is cooperative.        Thought Content: Thought content normal.        Cognition and Memory: Cognition normal.        Judgment: Judgment normal.     Review of Systems  Constitutional: Negative.   HENT: Negative.   Eyes: Negative.   Respiratory: Negative.   Cardiovascular: Negative.   Gastrointestinal: Negative.   Neurological: Negative.   Psychiatric/Behavioral: Negative.     Blood pressure (!) 153/90, pulse 75, temperature 98.4 F (36.9 C), temperature source Oral, resp. rate 16, height 6\' 3"  (1.905 m), weight 133.4 kg, SpO2 100 %.Body mass index is 36.76 kg/m.  General Appearance: Casual  Eye Contact:  Fair  Speech:  Clear and Coherent and Normal Rate  Volume:  Normal  Mood:  Irritable  Affect:  Appropriate and Congruent  Thought Process:  Coherent, Goal Directed and Descriptions of Associations: Intact  Orientation:  Full (Time, Place, and Person)  Thought Content:  WDL and Logical  Suicidal Thoughts:  No  Homicidal Thoughts:  No  Memory:  Immediate;   Fair Recent;   Fair Remote;   Fair  Judgement:  Fair  Insight:  Fair  Psychomotor Activity:  Normal  Concentration:  Concentration: Fair  Recall:  [employer] of Knowledge:  Fair  Language:  Fair  Akathisia:  No  Handed:  Right  AIMS (if indicated):     Assets:  Communication Skills Desire for Improvement Financial Resources/Insurance Housing Intimacy Leisure Time Social Support  ADL's:  Impaired  Cognition:  WNL  Sleep:        Treatment Plan Summary: Patient reviewed with Dr. Hampton Abbot.  Patient is a 59 year old male admitted for right below-knee amputation.  Patient is  irritable, denies depressive symptoms, mood swings, delusions or psychosis. Based on my evaluation today, patient does not have capacity currently to make medical decisions including disposition, however, capacity to make medical decisions may change from time to time.  1. Patient is alert and oriented to person, place, time and situation. 2. Patient has limited understanding of his current condition/illness, patient does understand his reason for being hospitalized currently. 3. Patient does not appear to understand fully the risk of refusing treatment.   Please contact next of kin or appropriate hospital administrator to help with decision-making process.  Disposition: No evidence of imminent risk to self or others at present.   Patient does not meet criteria for psychiatric inpatient admission. Supportive therapy provided about ongoing stressors.  Emmaline Kluver, FNP 10/23/2019 9:29 AM

## 2019-10-23 NOTE — Progress Notes (Signed)
Physical Therapy Treatment Patient Details Name: Zachary Young MRN: [id number] DOB: Nov 25, 1960 Today's Date: 10/23/2019    History of Present Illness Pt is a 59 y/o male s/p R transtibial amputation. PMH including but not limited to CHF, DM, HTN.    PT Comments    Pt making steady progress towards achieving his current functional mobility goals. Pt OOB in recliner chair upon arrival. Focus of session was on pt education and specific therex for s/p amputation. PT emphasized the importance of maintaining R knee extension ROM and wearing the limb protector appropriately. Pt expressed understanding. Pt would continue to benefit from skilled physical therapy services at this time while admitted and after d/c to address the below listed limitations in order to improve overall safety and independence with functional mobility.    Follow Up Recommendations  CIR;Other (comment) (pt specifically wants rehab through [facility])     Equipment Recommendations  None recommended by PT    Recommendations for Other Services       Precautions / Restrictions Precautions Precautions: Fall Precaution Comments: wound VAC Required Braces or Orthoses: Other Brace Other Brace: R limb guard Restrictions Weight Bearing Restrictions: Yes Other Position/Activity Restrictions: NWB R LE    Mobility  Bed Mobility               General bed mobility comments: pt OOB in recliner chair upon arrival  Transfers                 General transfer comment: focus of session was specific therex for s/p amputation and education  Ambulation/Gait                 Stairs             Wheelchair Mobility    Modified Rankin (Stroke Patients Only)       Balance Overall balance assessment: Needs assistance Sitting-balance support: No upper extremity supported;Feet supported Sitting balance-Leahy Scale: Good                                      Cognition Arousal/Alertness:  Awake/alert Behavior During Therapy: Flat affect Overall Cognitive Status: Impaired/Different from baseline Area of Impairment: Safety/judgement;Awareness;Problem solving                         Safety/Judgement: Decreased awareness of deficits;Decreased awareness of safety Awareness: Intellectual Problem Solving: Difficulty sequencing;Requires verbal cues        Exercises Amputee Exercises Quad Sets: AROM;Strengthening;Right;10 reps;Seated Hip ABduction/ADduction: AROM;Strengthening;Right;15 reps;Seated Hip Flexion/Marching: AROM;Strengthening;Right;10 reps;Seated Knee Flexion: AROM;Strengthening;Right;10 reps;Seated Knee Extension: AROM;Strengthening;Right;10 reps;Seated Straight Leg Raises: AROM;Strengthening;Right;15 reps;Seated Chair Push Up: AROM;Strengthening;10 reps;Seated Other Exercises Other Exercises: demonstrated and instructed pt on gentle tactile input to distal aspect of residual limb    General Comments        Pertinent Vitals/Pain Pain Assessment: Faces Faces Pain Scale: Hurts little more Pain Location: RLE Pain Descriptors / Indicators: Sore;Constant Pain Intervention(s): Monitored during session;Repositioned    Home Living                      Prior Function            PT Goals (current goals can now be found in the care plan section) Acute Rehab PT Goals PT Goal Formulation: With patient Time For Goal Achievement: 11/05/19 Potential to Achieve Goals: Good Progress towards PT goals: Progressing  toward goals    Frequency    Min 4X/week      PT Plan Current plan remains appropriate    Co-evaluation              AM-PAC PT "6 Clicks" Mobility   Outcome Measure  Help needed turning from your back to your side while in a flat bed without using bedrails?: None Help needed moving from lying on your back to sitting on the side of a flat bed without using bedrails?: None Help needed moving to and from a bed to a chair  (including a wheelchair)?: A Lot Help needed standing up from a chair using your arms (e.g., wheelchair or bedside chair)?: A Lot Help needed to walk in hospital room?: A Lot Help needed climbing 3-5 steps with a railing? : Total 6 Click Score: 15    End of Session   Activity Tolerance: Patient tolerated treatment well Patient left: in chair;with call bell/phone within reach Nurse Communication: Mobility status PT Visit Diagnosis: Other abnormalities of gait and mobility (R26.89);Pain Pain - Right/Left: Right Pain - part of body: Leg     Time: 7670-1100 PT Time Calculation (min) (ACUTE ONLY): 14 min  Charges:  $Therapeutic Exercise: 8-22 mins                     Anastasio Champion, DPT  Acute Rehabilitation Services Pager [phone] Office [place] 10/23/2019, 10:06 AM

## 2019-10-23 NOTE — Progress Notes (Signed)
Patient is postop day 2 status post below-knee amputation.  He is lying in bed dozing on and off.  Does not make eye contact.  Praveena pump is in place.  Patient is wearing his limb protector.  Will need follow-up with Ortho as an outpatient next week.

## 2019-10-23 NOTE — Social Work (Signed)
Pt reporting his brother Juanda Crumble is agreeable to picking him up and agreed to SW calling to confirm. SW spoke with Juanda Crumble [phone] who reports after discussing with pt's sisters, he has decided not to pick up pt because "I have no where to take him." Updated pt who verbalized understanding. CM attempting inpatient rehab placement at [facility].   Wandra Feinstein, MSW, LCSW [phone] (coverage)

## 2019-10-23 NOTE — Progress Notes (Signed)
PROGRESS NOTE    Zachary Young  NKN:[id number] DOB: [birth year] DOA: 10/17/2019 PCP: System, Pcp Not In     Brief Narrative:   59 y.o. BM PMHx CKD stage V, DM type II controlled with complication, Diabetic Neuropathy, Chronic systolic and diastolic CHF, HTN,   Presents  with pain who presented after passing out. Zachary Young notes that today, he went out on to his front porch to get some fresh air. Once out there, he became dizzy and had to lean against the house, and then he passed out. He slid down the side of the house and did not hit his head. He noted no chest pain, no fever, no chills, no change in urination. He does note that he has had a decreased appetite for the last 3-4 days, eating only about half of what he normally does. He has passed out before in the past when his sugar drops, and he thinks this may have happened today. He denies chest pain. He has SOB with ambulating at times, but nothing today. He report no change in urination and feels like he is urinating the normal amount. He reports taking torsemide 80mg  in the morning and evening (4 tablets) and not missing doses. He was having issues with pain in bed, and as I looked at his legs, I noted that his right leg was swollen, warm and mildly erythematous compared to the left. When asked, he noted a new wound on the plantar surface, at the 4th and 5th digit which he noticed first about 2 weeks ago.    Subjective: 7/9 A/O x4, negative CP, negative S OB, negative abdominal pain.  Negative RIGHT lower extremity pain.  Patient states that his brother will going to pick him up today.  Patient's RN called brother, brother stated they would not pick him up he had nowhere to go.   Assessment & Plan: Covid vaccination; positive vaccination   Active Problems:   Hypertensive heart disease with CHF (congestive heart failure) (HCC)   Chronic kidney disease, stage 4 (severe) (HCC)   HLD (hyperlipidemia)   HTN (hypertension)    Systolic and diastolic CHF, chronic (HCC)   Tobacco use disorder   Type 2 diabetes mellitus with circulatory disorder, with long-term current use of insulin (HCC)   Wound of right foot   Syncope and collapse   Cocaine abuse ([name])   Subacute osteomyelitis, right ankle and foot (HCC)   Abscess of right foot   Diabetic neuropathy (HCC)   Anemia of chronic disease   Severe obesity (BMI 35.0-39.9) with comorbidity ([name])   Metabolic acidosis   DM (diabetes mellitus), secondary, uncontrolled, with complications (HCC)   Elevated sedimentation rate   Elevated C-reactive protein (CRP)   Hypoalbuminemia   Goals of care, counseling/discussion   Palliative care by specialist   RIGHT foot wound -Plan is for Zachary Young to perform RIGHT BKA on 7/7 -7/7 s/p RIGHT transtibial amputation -Continue antibiotics per surgery wishes. -7/8 patient now has a brace to form the stump, continues with drain  DM type II uncontrolled with complication -7/3 hemoglobin A1c=8.8 -7/7 consult diabetic coordinator poorly controlled diabetic reeducate patient on diabetes. 7/7 consult diabetic nutrition;poorly controlled diabetic reeducate patient on diabetes. -7/8 patient insisted that he have additional snacks during the night even though it has been explained by numerous personnel that his diabetes is uncontrolled and this will affect his ability to heal. -Lantus 10 units qhs -7/8 NovoLog 8 units qac -Resistant SSI  Syncope and collapse -likely  multifactorial including overdiuresis, acute on chronic kidney disease and acute infection - refused telemetry - TTE from May was very difficult given habitus  Chronic diastolic and systolic CHF -EF 50 to 67% by echocardiogram 08/16/2019 -Strict ins and out +127.42ml -Daily weight Filed Weights   10/20/19 0548 10/21/19 0922 10/22/19 0014  Weight: (!) 136.2 kg (!) 136.2 kg 133.4 kg  -Amlodipine 10 mg daily -Torsemide 40 mg BID  HTN (hypertension) - Continue  amlodipine, torsemide as noted above  Acute onCKD, stage 4 (baseline CR = 3.7---> 4.7) Recent Labs  Lab 10/19/19 0549 10/20/19 0712 10/21/19 0459 10/22/19 0244 10/23/19 0509  CREATININE 5.43* 5.13* 4.66* 4.87* 5.20*  - Trend renal function -7/8 consulted Nephrology Zachary Young for evaluation of patient's continuing poor renal function.  I do not believe patient is good candidate for HD, but will defer to nephrology.  -Nephrology evaluated patient and concurs that patient is not an HD candidate  Normocytic anemia? Recent Labs  Lab 10/19/19 0549 10/20/19 0712 10/21/19 0459 10/22/19 0244 10/23/19 0509  HGB 8.4* 8.7* 8.5* 8.7* 8.8*  -Anemia panel pending -Occult blood pending -Transfuse for hemoglobin<7  HLD (hyperlipidemia) -5/2 LDL= 50  Tobacco use disorder - Counseling prior to discharge  Cocaine abuse -7/4 urine drug screen positive for cocaine -Patient counseled on cessation  Obese -Body mass index is 37.53 kg/m. -See uncontrolled DM type II  Competent to make medical decision?/Dementia? -Patient is extremely belligerent with staff, and when requested that he exhibits some respect with staff, patient becomes more belligerent. -Patient with EXTREMELY poor understanding of his multiple medical problems.  Believes that he can go to [facility] and stay for 6 months at a rehab facility, at the same time initially interested in CIR. -Per patient lives in a camper without running water or electricity, therefore going home would be an unsafe environment.   -On multiple occasion nursing staff has requested consent to contact his family to help in decision-making for rehab patient has refused to allow staff to speak with family members, and after patient cursed out family members on the phone they are no longer answering his phone calls. -7/9 Per Zachary Young psychiatry patient DOES NOt appear to have the capacity currently to determine disposition    Goals of care -7/7  consult to palliative care;-with history of drug abuse, HTN, uncontrolled diabetes poor candidate for hemodialysis but is a CKD stage V patient.  Discuss changing status to DNR, home palliative care; addendum palliative care spoke with patient, per the note again patient demonstrated NO UNDERSTANDING of multiple medical problems, and was reluctant to address seriously goals of care. 7/8 consult psychiatry;Patient becomes very combative, with physicians and nursing staff.  In addition does not appear to understand his medical situation to include recent surgery, and lack of living conditions.  Unsure patient able to make his own medical decisions.  Please evaluate; IVC?    DVT prophylaxis: Lovenox Code Status: Full Family Communication: 7/7 family at bedside for discussion of plan of care Status is: Inpatient    Dispo: The patient is from: Home              Anticipated d/c is to: SNF              Anticipated d/c date is: Per surgery              Patient currently unstable      Consultants:  Zachary Young [facility] orthopedic surgery    Procedures/Significant Events:  7/7 RIGHT Transtibial amputation Application of Prevena wound VAC    I have personally reviewed and interpreted all radiology studies and my findings are as above.  VENTILATOR SETTINGS:    Cultures 7/3 SARS coronavirus negative    Antimicrobials: Anti-infectives (From admission, onward)   Start     Ordered Stop   10/21/19 0600  ceFAZolin (ANCEF) 3 g in dextrose 5 % 50 mL IVPB        10/20/19 1810 10/21/19 1013   10/20/19 1000  vancomycin (VANCOREADY) IVPB 1250 mg/250 mL        10/20/19 0930 10/20/19 1143   10/17/19 2115  cefTRIAXone (ROCEPHIN) 2 g in sodium chloride 0.9 % 100 mL IVPB  Status:  Discontinued        10/17/19 2112 10/22/19 1603   10/17/19 2115  metroNIDAZOLE (FLAGYL) IVPB 500 mg  Status:  Discontinued        10/17/19 2112 10/22/19 1603   10/17/19 2115  vancomycin (VANCOCIN) 2,250 mg in  sodium chloride 0.9 % 500 mL IVPB        10/17/19 2110 10/18/19 0209   10/17/19 2110  vancomycin variable dose per unstable renal function (pharmacist dosing)  Status:  Discontinued        10/17/19 2110 10/22/19 1603       Devices    LINES / TUBES:      Continuous Infusions:    Objective: Vitals:   10/22/19 1612 10/22/19 2036 10/22/19 2300 10/23/19 0531  BP: 133/82 117/80 (!) 146/73 (!) 153/90  Pulse: 79 72 73 75  Resp: 18 16 18 16   Temp: 97.8 F (36.6 C) 97.7 F (36.5 C) 98.9 F (37.2 C) 98.4 F (36.9 C)  TempSrc: Oral Oral Oral Oral  SpO2: 94% 97%  100%  Weight:      Height:        Intake/Output Summary (Last 24 hours) at 10/23/2019 0805 Last data filed at 10/22/2019 2350 Gross per 24 hour  Intake 243 ml  Output 1500 ml  Net -1257 ml   Filed Weights   10/20/19 0548 10/21/19 0922 10/22/19 0014  Weight: (!) 136.2 kg (!) 136.2 kg 133.4 kg   Physical Exam:  General: A/O x4, extremely belligerent towards staff cursing and yelling at them.  No acute respiratory distress Eyes: negative scleral hemorrhage, negative anisocoria, negative icterus ENT: Negative Runny nose, negative gingival bleeding, Neck:  Negative scars, masses, torticollis, lymphadenopathy, JVD Lungs: Clear to auscultation bilaterally without wheezes or crackles Cardiovascular: Regular rate and rhythm without murmur gallop or rub normal S1 and S2 Abdomen: negative abdominal pain, nondistended, positive soft, bowel sounds, no rebound, no ascites, no appreciable mass Extremities: LEFT transtarsal amputation well-healed, RIGHT transtarsal amputation covered and clean with in a stump binder, wound VAC drain putting out minimal drainage Skin: Negative rashes, lesions, ulcers\ Psychiatric:  Negative depression, negative anxiety, negative fatigue, negative mania, NO UNDERSTANDING of current multiple medical problems. Central nervous system:  Cranial nerves II through XII intact, tongue/uvula midline, all  extremities muscle strength 5/5, sensation intact throughout, negative dysarthria, negative expressive aphasia, negative receptive aphasia.  .     Data Reviewed: Care during the described time interval was provided by me .  I have reviewed this patient's available data, including medical history, events of note, physical examination, and all test results as part of my evaluation.  CBC: Recent Labs  Lab 10/19/19 0549 10/20/19 0712 10/21/19 0459 10/22/19 0244 10/23/19 0509  WBC 6.1 6.2 5.5 8.6 8.0  NEUTROABS  --   --   --  6.7 4.4  HGB 8.4* 8.7* 8.5* 8.7* 8.8*  HCT 28.1* 29.0* 28.3* 28.7* 29.1*  MCV 86.2 87.9 85.5 84.7 84.6  PLT 415* 417* 435* 472* 785*   Basic Metabolic Panel: Recent Labs  Lab 10/19/19 0549 10/20/19 0712 10/21/19 0459 10/22/19 0244 10/23/19 0509  NA 142 141 140 136 136  K 4.0 4.3 4.3 4.9 4.6  CL 111 107 104 103 101  CO2 19* 19* 23 22 21*  GLUCOSE 150* 181* 179* 207* 148*  BUN 85* 83* 91* 93* 107*  CREATININE 5.43* 5.13* 4.66* 4.87* 5.20*  CALCIUM 7.7* 8.2* 8.9 9.2 9.4  MG  --   --   --  1.8 2.0  PHOS  --   --   --  5.7* 7.2*   GFR: Estimated Creatinine Clearance: 23.1 mL/min (A) (by C-G formula based on SCr of 5.2 mg/dL (H)). Liver Function Tests: Recent Labs  Lab 10/18/19 0815 10/22/19 0244 10/23/19 0509  AST 11* 12* 14*  ALT 10 7 6   ALKPHOS 84 88 76  BILITOT 0.5 0.5 0.5  PROT 8.1 8.1 8.1  ALBUMIN 2.3* 2.5* 2.6*   No results for input(s): LIPASE, AMYLASE in the last 168 hours. No results for input(s): AMMONIA in the last 168 hours. Coagulation Profile: No results for input(s): INR, PROTIME in the last 168 hours. Cardiac Enzymes: No results for input(s): CKTOTAL, CKMB, CKMBINDEX, TROPONINI in the last 168 hours. BNP (last 3 results) No results for input(s): PROBNP in the last 8760 hours. HbA1C: No results for input(s): HGBA1C in the last 72 hours. CBG: Recent Labs  Lab 10/22/19 0537 10/22/19 1101 10/22/19 1606 10/22/19 2154  10/23/19 0532  GLUCAP 158* 174* 252* 206* 136*   Lipid Profile: No results for input(s): CHOL, HDL, LDLCALC, TRIG, CHOLHDL, LDLDIRECT in the last 72 hours. Thyroid Function Tests: No results for input(s): TSH, T4TOTAL, FREET4, T3FREE, THYROIDAB in the last 72 hours. Anemia Panel: Recent Labs    10/22/19 0244  VITAMINB12 708  FOLATE 8.2  FERRITIN 108  TIBC 258  IRON 36*  RETICCTPCT 1.2   Sepsis Labs: No results for input(s): PROCALCITON, LATICACIDVEN in the last 168 hours.  Recent Results (from the past 240 hour(s))  SARS Coronavirus 2 by RT PCR (hospital order, performed in [facility] hospital lab) Nasopharyngeal Nasopharyngeal Swab     Status: None   Collection Time: 10/17/19  6:49 PM   Specimen: Nasopharyngeal Swab  Result Value Ref Range Status   SARS Coronavirus 2 NEGATIVE NEGATIVE Final    Comment: (NOTE) SARS-CoV-2 target nucleic acids are NOT DETECTED.  The SARS-CoV-2 RNA is generally detectable in upper and lower respiratory specimens during the acute phase of infection. The lowest concentration of SARS-CoV-2 viral copies this assay can detect is 250 copies / mL. A negative result does not preclude SARS-CoV-2 infection and should not be used as the sole basis for treatment or other patient management decisions.  A negative result may occur with improper specimen collection / handling, submission of specimen other than nasopharyngeal swab, presence of viral mutation(s) within the areas targeted by this assay, and inadequate number of viral copies (<250 copies / mL). A negative result must be combined with clinical observations, patient history, and epidemiological information.  Fact Sheet for Patients:   StrictlyIdeas.no  Fact Sheet for Healthcare Providers: BankingDealers.co.za  This test is not yet approved or  cleared by the [place] FDA and has been authorized for detection and/or diagnosis of SARS-CoV-2  by FDA under an Emergency  Use Authorization (EUA).  This EUA will remain in effect (meaning this test can be used) for the duration of the COVID-19 declaration under Section 564(b)(1) of the Act, 21 U.S.C. section 360bbb-3(b)(1), unless the authorization is terminated or revoked sooner.  Performed at [facility], [address] [place], [place] [address]          Radiology Studies: No results found.      Scheduled Meds: . amLODipine  10 mg Oral Daily  . aspirin EC  81 mg Oral Daily  . collagenase   Topical Daily  . docusate sodium  100 mg Oral BID  . enoxaparin (LOVENOX) injection  30 mg Subcutaneous Q24H  . gabapentin  300 mg Oral Daily   And  . gabapentin  600 mg Oral QHS  . insulin aspart  0-20 Units Subcutaneous TID WC  . insulin aspart  0-5 Units Subcutaneous QHS  . insulin aspart  8 Units Subcutaneous TID WC  . insulin glargine  10 Units Subcutaneous QHS  . multivitamin with minerals  1 tablet Oral Daily  . nutrition supplement (JUVEN)  1 packet Oral BID BM  . Ensure Max Protein  11 oz Oral BID  . sodium chloride flush  3 mL Intravenous Q12H  . torsemide  40 mg Oral BID   Continuous Infusions:    LOS: 6 days    Time spent:40 min    Sharae Zappulla, Geraldo Docker, MD [facility] Pager [phone]  If 7PM-7AM, please contact night-coverage [email or web address] Password [email or web address] 10/23/2019, 8:05 AM

## 2019-10-24 DIAGNOSIS — R55 Syncope and collapse: Secondary | ICD-10-CM | POA: Diagnosis not present

## 2019-10-24 DIAGNOSIS — F1410 Cocaine abuse, uncomplicated: Secondary | ICD-10-CM | POA: Diagnosis not present

## 2019-10-24 DIAGNOSIS — E138 Other specified diabetes mellitus with unspecified complications: Secondary | ICD-10-CM | POA: Diagnosis not present

## 2019-10-24 DIAGNOSIS — L02611 Cutaneous abscess of right foot: Secondary | ICD-10-CM | POA: Diagnosis not present

## 2019-10-24 LAB — CBC WITH DIFFERENTIAL/PLATELET
Abs Immature Granulocytes: 0.01 10*3/uL (ref 0.00–0.07)
Basophils Absolute: 0 10*3/uL (ref 0.0–0.1)
Basophils Relative: 0 %
Eosinophils Absolute: 0.2 10*3/uL (ref 0.0–0.5)
Eosinophils Relative: 3 %
HCT: 29.1 % — ABNORMAL LOW (ref 39.0–52.0)
Hemoglobin: 9 g/dL — ABNORMAL LOW (ref 13.0–17.0)
Immature Granulocytes: 0 %
Lymphocytes Relative: 35 %
Lymphs Abs: 2.7 10*3/uL (ref 0.7–4.0)
MCH: 26.4 pg (ref 26.0–34.0)
MCHC: 30.9 g/dL (ref 30.0–36.0)
MCV: 85.3 fL (ref 80.0–100.0)
Monocytes Absolute: 0.9 10*3/uL (ref 0.1–1.0)
Monocytes Relative: 11 %
Neutro Abs: 4 10*3/uL (ref 1.7–7.7)
Neutrophils Relative %: 51 %
Platelets: 444 10*3/uL — ABNORMAL HIGH (ref 150–400)
RBC: 3.41 MIL/uL — ABNORMAL LOW (ref 4.22–5.81)
RDW: 14.9 % (ref 11.5–15.5)
WBC: 7.9 10*3/uL (ref 4.0–10.5)
nRBC: 0 % (ref 0.0–0.2)

## 2019-10-24 LAB — COMPREHENSIVE METABOLIC PANEL
ALT: 5 U/L (ref 0–44)
AST: 12 U/L — ABNORMAL LOW (ref 15–41)
Albumin: 2.4 g/dL — ABNORMAL LOW (ref 3.5–5.0)
Alkaline Phosphatase: 87 U/L (ref 38–126)
Anion gap: 12 (ref 5–15)
BUN: 121 mg/dL — ABNORMAL HIGH (ref 6–20)
CO2: 24 mmol/L (ref 22–32)
Calcium: 9.8 mg/dL (ref 8.9–10.3)
Chloride: 103 mmol/L (ref 98–111)
Creatinine, Ser: 4.86 mg/dL — ABNORMAL HIGH (ref 0.61–1.24)
GFR calc Af Amer: 14 mL/min — ABNORMAL LOW (ref >60)
GFR calc non Af Amer: 12 mL/min — ABNORMAL LOW (ref >60)
Glucose, Bld: 202 mg/dL — ABNORMAL HIGH (ref 70–99)
Potassium: 5.1 mmol/L (ref 3.5–5.1)
Sodium: 139 mmol/L (ref 135–145)
Total Bilirubin: 0.3 mg/dL (ref 0.3–1.2)
Total Protein: 7.8 g/dL (ref 6.5–8.1)

## 2019-10-24 LAB — GLUCOSE, CAPILLARY
Glucose-Capillary: 160 mg/dL — ABNORMAL HIGH (ref 70–99)
Glucose-Capillary: 116 mg/dL — ABNORMAL HIGH (ref 70–99)
Glucose-Capillary: 155 mg/dL — ABNORMAL HIGH (ref 70–99)
Glucose-Capillary: 309 mg/dL — ABNORMAL HIGH (ref 70–99)
Glucose-Capillary: 293 mg/dL — ABNORMAL HIGH (ref 70–99)

## 2019-10-24 LAB — MAGNESIUM: Magnesium: 2.1 mg/dL (ref 1.7–2.4)

## 2019-10-24 LAB — PHOSPHORUS: Phosphorus: 6.3 mg/dL — ABNORMAL HIGH (ref 2.5–4.6)

## 2019-10-24 MED ORDER — INSULIN ASPART 100 UNIT/ML ~~LOC~~ SOLN
0.0000 [IU] | SUBCUTANEOUS | Status: DC
  Administered 2019-10-24: 8 [IU] via SUBCUTANEOUS
  Administered 2019-10-24: 11 [IU] via SUBCUTANEOUS
  Administered 2019-10-25: 5 [IU] via SUBCUTANEOUS
  Administered 2019-10-25: 8 [IU] via SUBCUTANEOUS
  Administered 2019-10-25: 5 [IU] via SUBCUTANEOUS
  Administered 2019-10-25: 11 [IU] via SUBCUTANEOUS
  Administered 2019-10-25: 2 [IU] via SUBCUTANEOUS
  Administered 2019-10-25 – 2019-10-26 (×4): 3 [IU] via SUBCUTANEOUS
  Administered 2019-10-26: 11 [IU] via SUBCUTANEOUS
  Administered 2019-10-26 – 2019-10-27 (×3): 3 [IU] via SUBCUTANEOUS
  Administered 2019-10-27 (×2): 5 [IU] via SUBCUTANEOUS
  Administered 2019-10-27: 8 [IU] via SUBCUTANEOUS
  Administered 2019-10-27 – 2019-10-28 (×3): 5 [IU] via SUBCUTANEOUS
  Administered 2019-10-28 (×2): 3 [IU] via SUBCUTANEOUS
  Administered 2019-10-28: 2 [IU] via SUBCUTANEOUS
  Administered 2019-10-29: 3 [IU] via SUBCUTANEOUS
  Administered 2019-10-29: 5 [IU] via SUBCUTANEOUS
  Administered 2019-10-29: 3 [IU] via SUBCUTANEOUS
  Administered 2019-10-29: 2 [IU] via SUBCUTANEOUS
  Administered 2019-10-30 (×2): 3 [IU] via SUBCUTANEOUS
  Administered 2019-10-30: 2 [IU] via SUBCUTANEOUS
  Administered 2019-10-30: 3 [IU] via SUBCUTANEOUS
  Administered 2019-10-30: 2 [IU] via SUBCUTANEOUS
  Administered 2019-10-31 (×2): 5 [IU] via SUBCUTANEOUS
  Administered 2019-10-31 – 2019-11-01 (×5): 3 [IU] via SUBCUTANEOUS
  Administered 2019-11-01: 5 [IU] via SUBCUTANEOUS
  Administered 2019-11-01: 2 [IU] via SUBCUTANEOUS
  Administered 2019-11-01: 4 [IU] via SUBCUTANEOUS
  Administered 2019-11-01 – 2019-11-02 (×2): 2 [IU] via SUBCUTANEOUS
  Administered 2019-11-02: 3 [IU] via SUBCUTANEOUS
  Administered 2019-11-02: 2 [IU] via SUBCUTANEOUS
  Administered 2019-11-02: 3 [IU] via SUBCUTANEOUS
  Administered 2019-11-03 (×2): 5 [IU] via SUBCUTANEOUS
  Administered 2019-11-03: 2 [IU] via SUBCUTANEOUS
  Administered 2019-11-04: 3 [IU] via SUBCUTANEOUS
  Administered 2019-11-04: 5 [IU] via SUBCUTANEOUS
  Administered 2019-11-04 (×2): 3 [IU] via SUBCUTANEOUS
  Administered 2019-11-04: 5 [IU] via SUBCUTANEOUS
  Administered 2019-11-05 – 2019-11-06 (×4): 3 [IU] via SUBCUTANEOUS
  Administered 2019-11-06 (×2): 2 [IU] via SUBCUTANEOUS
  Administered 2019-11-06: 3 [IU] via SUBCUTANEOUS
  Administered 2019-11-07 (×2): 2 [IU] via SUBCUTANEOUS
  Administered 2019-11-07: 5 [IU] via SUBCUTANEOUS
  Administered 2019-11-08: 2 [IU] via SUBCUTANEOUS

## 2019-10-24 NOTE — Progress Notes (Signed)
PROGRESS NOTE    Zachary Young  PTW:[id number] DOB: 01-13-1961 DOA: 10/17/2019 PCP: System, Pcp Not In     Brief Narrative:   59 y.o. BM PMHx CKD stage V, DM type II controlled with complication, Diabetic Neuropathy, Chronic systolic and diastolic CHF, HTN,   Presents  with pain who presented after passing out. Zachary Young notes that today, he went out on to his front porch to get some fresh air. Once out there, he became dizzy and had to lean against the house, and then he passed out. He slid down the side of the house and did not hit his head. He noted no chest pain, no fever, no chills, no change in urination. He does note that he has had a decreased appetite for the last 3-4 days, eating only about half of what he normally does. He has passed out before in the past when his sugar drops, and he thinks this may have happened today. He denies chest pain. He has SOB with ambulating at times, but nothing today. He report no change in urination and feels like he is urinating the normal amount. He reports taking torsemide 80mg  in the morning and evening (4 tablets) and not missing doses. He was having issues with pain in bed, and as I looked at his legs, I noted that his right leg was swollen, warm and mildly erythematous compared to the left. When asked, he noted a new wound on the plantar surface, at the 4th and 5th digit which he noticed first about 2 weeks ago.    Subjective: 7/10 A/O x4, negative CP, negative S OB.  RIGHT lower extremity pain controlled.  Understands that yesterday  brother stated they would not pick him up he had nowhere to go.   Assessment & Plan: Covid vaccination; positive vaccination   Active Problems:   Hypertensive heart disease with CHF (congestive heart failure) (HCC)   Chronic kidney disease, stage 4 (severe) (HCC)   HLD (hyperlipidemia)   HTN (hypertension)   Systolic and diastolic CHF, chronic (HCC)   Tobacco use disorder   Type 2 diabetes  mellitus with circulatory disorder, with long-term current use of insulin (HCC)   Wound of right foot   Syncope and collapse   Cocaine abuse ([place])   Subacute osteomyelitis, right ankle and foot (HCC)   Abscess of right foot   Diabetic neuropathy (HCC)   Anemia of chronic disease   Severe obesity (BMI 35.0-39.9) with comorbidity ([place])   Metabolic acidosis   DM (diabetes mellitus), secondary, uncontrolled, with complications (HCC)   Elevated sedimentation rate   Elevated C-reactive protein (CRP)   Hypoalbuminemia   Goals of care, counseling/discussion   Palliative care by specialist   RIGHT foot wound -Plan is for Dr. Meridee Score to perform RIGHT BKA on 7/7 -7/7 s/p RIGHT transtibial amputation -Continue antibiotics per surgery wishes. -7/8 patient now has a brace to form the stump, continues with drain  DM type II uncontrolled with complication -7/3 hemoglobin A1c=8.8 -7/7 consult diabetic coordinator poorly controlled diabetic reeducate patient on diabetes. 7/7 consult diabetic nutrition;poorly controlled diabetic reeducate patient on diabetes. -7/8 patient insisted that he have additional snacks during the night even though it has been explained by numerous personnel that his diabetes is uncontrolled and this will affect his ability to heal. -Lantus 10 units qhs -7/8 NovoLog 8 units qac -7/10 decrease moderate SSI   Syncope and collapse -likely multifactorial including overdiuresis, acute on chronic kidney disease and acute infection - refused  telemetry - TTE from May was very difficult given habitus  Chronic diastolic and systolic CHF -EF 50 to 28% by echocardiogram 08/16/2019 -Strict ins and out -1.0 L -Daily weight Filed Weights   10/20/19 0548 10/21/19 0922 10/22/19 0014  Weight: (!) 136.2 kg (!) 136.2 kg 133.4 kg  -Amlodipine 10 mg daily -Torsemide 40 mg BID  HTN (hypertension) - Continue amlodipine, torsemide as noted above  Acute onCKD, stage 4 (baseline  CR = 3.7---> 4.7) Recent Labs  Lab 10/20/19 0712 10/21/19 0459 10/22/19 0244 10/23/19 0509 10/24/19 0446  CREATININE 5.13* 4.66* 4.87* 5.20* 4.86*  - Trend renal function -7/8 consulted Nephrology Dr. Jane Canary for evaluation of patient's continuing poor renal function.  I do not believe patient is good candidate for HD, but will defer to nephrology.  -Nephrology evaluated patient and concurs that patient is not an HD candidate  Anemia of chronic disease Recent Labs  Lab 10/20/19 0712 10/21/19 0459 10/22/19 0244 10/23/19 0509 10/24/19 0446  HGB 8.7* 8.5* 8.7* 8.8* 9.0*  -Anemia panel consistent with anemia of chronic disease.  Most likely secondary to his renal failure -Occult blood pending -Transfuse for hemoglobin<7  HLD (hyperlipidemia) -5/2 LDL= 50  Tobacco use disorder - Counseling prior to discharge  Cocaine abuse -7/4 urine drug screen positive for cocaine -Patient counseled on cessation  Obese -Body mass index is 37.53 kg/m. -See uncontrolled DM type II  Competent to make medical decision?/Dementia? -Patient is extremely belligerent with staff, and when requested that he exhibits some respect with staff, patient becomes more belligerent. -Patient with EXTREMELY poor understanding of his multiple medical problems.  Believes that he can go to [facility] and stay for 6 months at a rehab facility, at the same time initially interested in CIR. -Per patient lives in a camper without running water or electricity, therefore going home would be an unsafe environment.   -On multiple occasion nursing staff has requested consent to contact his family to help in decision-making for rehab patient has refused to allow staff to speak with family members, and after patient cursed out family members on the phone they are no longer answering his phone calls. -7/9 Per NP Letitia Libra psychiatry patient DOES NOt appear to have the capacity currently to determine disposition    Goals of  care -7/7 consult to palliative care;-with history of drug abuse, HTN, uncontrolled diabetes poor candidate for hemodialysis but is a CKD stage V patient.  Discuss changing status to DNR, home palliative care; addendum palliative care spoke with patient, per the note again patient demonstrated NO UNDERSTANDING of multiple medical problems, and was reluctant to address seriously goals of care. 7/8 consult psychiatry;Patient becomes very combative, with physicians and nursing staff.  In addition does not appear to understand his medical situation to include recent surgery, and lack of living conditions.  Unsure patient able to make his own medical decisions.  Please evaluate; IVC?    DVT prophylaxis: Lovenox Code Status: Full Family Communication: 7/7 family at bedside for discussion of plan of care Status is: Inpatient    Dispo: The patient is from: Home              Anticipated d/c is to: SNF              Anticipated d/c date is: Per surgery              Patient currently unstable      Consultants:  Dr. Meridee Score [facility] orthopedic surgery  Procedures/Significant Events:  7/7 RIGHT Transtibial amputation Application of Prevena wound VAC    I have personally reviewed and interpreted all radiology studies and my findings are as above.  VENTILATOR SETTINGS:    Cultures 7/3 SARS coronavirus negative    Antimicrobials: Anti-infectives (From admission, onward)   Start     Ordered Stop   10/21/19 0600  ceFAZolin (ANCEF) 3 g in dextrose 5 % 50 mL IVPB        10/20/19 1810 10/21/19 1013   10/20/19 1000  vancomycin (VANCOREADY) IVPB 1250 mg/250 mL        10/20/19 0930 10/20/19 1143   10/17/19 2115  cefTRIAXone (ROCEPHIN) 2 g in sodium chloride 0.9 % 100 mL IVPB  Status:  Discontinued        10/17/19 2112 10/22/19 1603   10/17/19 2115  metroNIDAZOLE (FLAGYL) IVPB 500 mg  Status:  Discontinued        10/17/19 2112 10/22/19 1603   10/17/19 2115  vancomycin (VANCOCIN)  2,250 mg in sodium chloride 0.9 % 500 mL IVPB        10/17/19 2110 10/18/19 0209   10/17/19 2110  vancomycin variable dose per unstable renal function (pharmacist dosing)  Status:  Discontinued        10/17/19 2110 10/22/19 1603       Devices    LINES / TUBES:      Continuous Infusions:    Objective: Vitals:   10/23/19 1130 10/23/19 1735 10/23/19 2326 10/24/19 0632  BP: (!) 141/67 133/69 (!) 151/84 118/67  Pulse: 81 82 91 83  Resp: 16 14 16 16   Temp: 97.6 F (36.4 C) 97.8 F (36.6 C) 98.3 F (36.8 C) 98.5 F (36.9 C)  TempSrc: Oral Oral Oral Oral  SpO2: 99% 98% 96% 97%  Weight:      Height:        Intake/Output Summary (Last 24 hours) at 10/24/2019 0739 Last data filed at 10/24/2019 0200 Gross per 24 hour  Intake --  Output 3700 ml  Net -3700 ml   Filed Weights   10/20/19 0548 10/21/19 0922 10/22/19 0014  Weight: (!) 136.2 kg (!) 136.2 kg 133.4 kg   Physical Exam:  General: A/O x4, no acute respiratory distress Eyes: negative scleral hemorrhage, negative anisocoria, negative icterus ENT: Negative Runny nose, negative gingival bleeding, Neck:  Negative scars, masses, torticollis, lymphadenopathy, JVD Lungs: Clear to auscultation bilaterally without wheezes or crackles Cardiovascular: Regular rate and rhythm without murmur gallop or rub normal S1 and S2 Abdomen: negative abdominal pain, nondistended, positive soft, bowel sounds, no rebound, no ascites, no appreciable mass Extremities: LEFT transtarsal amputation well-healed, RIGHT transtarsal amputation covered and clean with in a stump binder, wound VAC drain not putting out any drainage Skin: Negative rashes, lesions, ulcers Psychiatric:  Negative depression, negative anxiety, negative fatigue, negative mania  Central nervous system:  Cranial nerves II through XII intact, tongue/uvula midline, all extremities muscle strength 5/5, sensation intact throughout, negative dysarthria, negative expressive aphasia,  negative receptive aphasia.  .     Data Reviewed: Care during the described time interval was provided by me .  I have reviewed this patient's available data, including medical history, events of note, physical examination, and all test results as part of my evaluation.  CBC: Recent Labs  Lab 10/20/19 0712 10/21/19 0459 10/22/19 0244 10/23/19 0509 10/24/19 0446  WBC 6.2 5.5 8.6 8.0 7.9  NEUTROABS  --   --  6.7 4.4 4.0  HGB 8.7* 8.5* 8.7* 8.8*  9.0*  HCT 29.0* 28.3* 28.7* 29.1* 29.1*  MCV 87.9 85.5 84.7 84.6 85.3  PLT 417* 435* 472* 410* 751*   Basic Metabolic Panel: Recent Labs  Lab 10/20/19 0712 10/21/19 0459 10/22/19 0244 10/23/19 0509 10/24/19 0446  NA 141 140 136 136 139  K 4.3 4.3 4.9 4.6 5.1  CL 107 104 103 101 103  CO2 19* 23 22 21* 24  GLUCOSE 181* 179* 207* 148* 202*  BUN 83* 91* 93* 107* 121*  CREATININE 5.13* 4.66* 4.87* 5.20* 4.86*  CALCIUM 8.2* 8.9 9.2 9.4 9.8  MG  --   --  1.8 2.0 2.1  PHOS  --   --  5.7* 7.2* 6.3*   GFR: Estimated Creatinine Clearance: 24.7 mL/min (A) (by C-G formula based on SCr of 4.86 mg/dL (H)). Liver Function Tests: Recent Labs  Lab 10/18/19 0815 10/22/19 0244 10/23/19 0509 10/24/19 0446  AST 11* 12* 14* 12*  ALT 10 7 6 5   ALKPHOS 84 88 76 87  BILITOT 0.5 0.5 0.5 0.3  PROT 8.1 8.1 8.1 7.8  ALBUMIN 2.3* 2.5* 2.6* 2.4*   No results for input(s): LIPASE, AMYLASE in the last 168 hours. No results for input(s): AMMONIA in the last 168 hours. Coagulation Profile: No results for input(s): INR, PROTIME in the last 168 hours. Cardiac Enzymes: No results for input(s): CKTOTAL, CKMB, CKMBINDEX, TROPONINI in the last 168 hours. BNP (last 3 results) No results for input(s): PROBNP in the last 8760 hours. HbA1C: No results for input(s): HGBA1C in the last 72 hours. CBG: Recent Labs  Lab 10/23/19 0532 10/23/19 1126 10/23/19 1618 10/23/19 2159 10/24/19 0632  GLUCAP 136* 213* 187* 201* 160*   Lipid Profile: No results  for input(s): CHOL, HDL, LDLCALC, TRIG, CHOLHDL, LDLDIRECT in the last 72 hours. Thyroid Function Tests: No results for input(s): TSH, T4TOTAL, FREET4, T3FREE, THYROIDAB in the last 72 hours. Anemia Panel: Recent Labs    10/22/19 0244  VITAMINB12 708  FOLATE 8.2  FERRITIN 108  TIBC 258  IRON 36*  RETICCTPCT 1.2   Sepsis Labs: No results for input(s): PROCALCITON, LATICACIDVEN in the last 168 hours.  Recent Results (from the past 240 hour(s))  SARS Coronavirus 2 by RT PCR (hospital order, performed in [facility] hospital lab) Nasopharyngeal Nasopharyngeal Swab     Status: None   Collection Time: 10/17/19  6:49 PM   Specimen: Nasopharyngeal Swab  Result Value Ref Range Status   SARS Coronavirus 2 NEGATIVE NEGATIVE Final    Comment: (NOTE) SARS-CoV-2 target nucleic acids are NOT DETECTED.  The SARS-CoV-2 RNA is generally detectable in upper and lower respiratory specimens during the acute phase of infection. The lowest concentration of SARS-CoV-2 viral copies this assay can detect is 250 copies / mL. A negative result does not preclude SARS-CoV-2 infection and should not be used as the sole basis for treatment or other patient management decisions.  A negative result may occur with improper specimen collection / handling, submission of specimen other than nasopharyngeal swab, presence of viral mutation(s) within the areas targeted by this assay, and inadequate number of viral copies (<250 copies / mL). A negative result must be combined with clinical observations, patient history, and epidemiological information.  Fact Sheet for Patients:   StrictlyIdeas.no  Fact Sheet for Healthcare Providers: BankingDealers.co.za  This test is not yet approved or  cleared by the [place] FDA and has been authorized for detection and/or diagnosis of SARS-CoV-2 by FDA under an Emergency Use Authorization (EUA).  This  EUA will remain in  effect (meaning this test can be used) for the duration of the COVID-19 declaration under Section 564(b)(1) of the Act, 21 U.S.C. section 360bbb-3(b)(1), unless the authorization is terminated or revoked sooner.  Performed at [facility], [place] [address] [place], [place] [address]          Radiology Studies: No results found.      Scheduled Meds: . amLODipine  10 mg Oral Daily  . aspirin EC  81 mg Oral Daily  . collagenase   Topical Daily  . docusate sodium  100 mg Oral BID  . enoxaparin (LOVENOX) injection  30 mg Subcutaneous Q24H  . gabapentin  300 mg Oral Daily   And  . gabapentin  600 mg Oral QHS  . insulin aspart  0-20 Units Subcutaneous TID WC  . insulin aspart  0-5 Units Subcutaneous QHS  . insulin aspart  8 Units Subcutaneous TID WC  . insulin glargine  10 Units Subcutaneous QHS  . multivitamin with minerals  1 tablet Oral Daily  . nutrition supplement (JUVEN)  1 packet Oral BID BM  . Ensure Max Protein  11 oz Oral BID  . sodium chloride flush  3 mL Intravenous Q12H  . torsemide  40 mg Oral BID   Continuous Infusions:    LOS: 7 days    Time spent:40 min    Londyn Wotton, Geraldo Docker, MD [facility] Pager [phone]  If 7PM-7AM, please contact night-coverage [email or web address] Password [facility] 10/24/2019, 7:39 AM

## 2019-10-24 NOTE — Progress Notes (Signed)
Physical Therapy Treatment Patient Details Name: Zachary Young MRN: [id number] DOB: [birth year] Today's Date: 10/24/2019    History of Present Illness Pt is a 59 y/o male s/p R transtibial amputation. PMH including but not limited to CHF, DM, HTN.    PT Comments    Pt seen for mobility progression; however, significantly limited this session secondary to multiple bouts of emesis upon sitting EOB. Pt unable to tolerate any further mobility at this time. RN was notified and present in room at end of session. Pt would continue to benefit from skilled physical therapy services at this time while admitted and after d/c to address the below listed limitations in order to improve overall safety and independence with functional mobility.    Follow Up Recommendations  CIR;Other (comment) (pt requesting rehab through [facility])     Equipment Recommendations  None recommended by PT    Recommendations for Other Services       Precautions / Restrictions Precautions Precautions: Fall Precaution Comments: wound VAC Required Braces or Orthoses: Other Brace Other Brace: R limb guard Restrictions Weight Bearing Restrictions: Yes Other Position/Activity Restrictions: NWB R LE    Mobility  Bed Mobility Overal bed mobility: Modified Independent             General bed mobility comments: increased time and effort  Transfers                 General transfer comment: deferred secondary to emesis - RN notified  Ambulation/Gait                 Stairs             Wheelchair Mobility    Modified Rankin (Stroke Patients Only)       Balance Overall balance assessment: Needs assistance Sitting-balance support: No upper extremity supported;Feet supported Sitting balance-Leahy Scale: Good                                      Cognition Arousal/Alertness: Awake/alert Behavior During Therapy: Flat affect Overall Cognitive Status: Impaired/Different from  baseline Area of Impairment: Following commands;Safety/judgement;Awareness;Problem solving                       Following Commands: Follows one step commands with increased time;Follows multi-step commands with increased time Safety/Judgement: Decreased awareness of deficits;Decreased awareness of safety Awareness: Intellectual Problem Solving: Difficulty sequencing;Requires verbal cues        Exercises      General Comments        Pertinent Vitals/Pain Pain Assessment: Faces Faces Pain Scale: Hurts a little bit Pain Location: RLE Pain Descriptors / Indicators: Sore Pain Intervention(s): Monitored during session;Repositioned    Home Living                      Prior Function            PT Goals (current goals can now be found in the care plan section) Acute Rehab PT Goals PT Goal Formulation: With patient Time For Goal Achievement: 11/05/19 Potential to Achieve Goals: Good Progress towards PT goals: Progressing toward goals    Frequency    Min 4X/week      PT Plan Current plan remains appropriate    Co-evaluation              AM-PAC PT "6 Clicks" Mobility   Outcome Measure  Help needed turning from your back to your side while in a flat bed without using bedrails?: None Help needed moving from lying on your back to sitting on the side of a flat bed without using bedrails?: None Help needed moving to and from a bed to a chair (including a wheelchair)?: A Lot Help needed standing up from a chair using your arms (e.g., wheelchair or bedside chair)?: A Lot Help needed to walk in hospital room?: Total Help needed climbing 3-5 steps with a railing? : Total 6 Click Score: 14    End of Session   Activity Tolerance: Other (comment) (limited secondary to multiple bouts of emesis upon sitting) Patient left: in bed;with call bell/phone within reach;Other (comment) (two RN's present in room) Nurse Communication: Mobility status;Other  (comment) (pt having several bouts of emesis) PT Visit Diagnosis: Other abnormalities of gait and mobility (R26.89);Pain Pain - Right/Left: Right Pain - part of body: Leg     Time: 6967-8938 PT Time Calculation (min) (ACUTE ONLY): 12 min  Charges:  $Therapeutic Activity: 8-22 mins                     Anastasio Champion, DPT  Acute Rehabilitation Services Pager [phone] Office [place] 10/24/2019, 1:24 PM

## 2019-10-24 NOTE — Progress Notes (Signed)
Occupational Therapy Treatment Patient Details Name: Zachary Young MRN: [id number] DOB: 29-Sep-1960 Today's Date: 10/24/2019    History of present illness Pt is a 59 y/o male s/p R transtibial amputation. PMH including but not limited to CHF, DM, HTN.   OT comments  Pt seen for OT follow up session with focus on BADL progression. Pt completed LB dressing in bed with min A with bridging and lateral leans while long sitting. Educated pt on appropriate compensatory techniques and dressing with wound vac, but pt was not receptive and chose to do opposite of instruction. Pt then completed bed mobility with mod I assist. Once EOB, pt had sudden onset of emesis. RN was informed and came in to administer medication. Further mobility deferred at this time. Will continue to follow.   Follow Up Recommendations  SNF;CIR (pt specifically wants to go to [facility] CIR)    Equipment Recommendations  3 in 1 bedside commode (drop arm)    Recommendations for Other Services      Precautions / Restrictions Precautions Precautions: Fall Precaution Comments: wound VAC Required Braces or Orthoses: Other Brace Other Brace: R limb guard Restrictions Weight Bearing Restrictions: Yes RLE Weight Bearing: Non weight bearing Other Position/Activity Restrictions: NWB R LE       Mobility Bed Mobility Overal bed mobility: Modified Independent             General bed mobility comments: increased time and effort  Transfers                 General transfer comment: deferred secondary to emesis - RN notified    Balance Overall balance assessment: Needs assistance Sitting-balance support: No upper extremity supported;Feet supported Sitting balance-Leahy Scale: Good                                     ADL either performed or assessed with clinical judgement   ADL Overall ADL's : Needs assistance/impaired                 Upper Body Dressing : Set up;Sitting Upper Body  Dressing Details (indicate cue type and reason): long sitting in bed Lower Body Dressing: Minimal assistance;Bed level Lower Body Dressing Details (indicate cue type and reason): long sitting in bed pt donned shorts with rolling and bridging. Attempted to educate pt on apporprite compensatory strategies and how to dress with a wound vac, but pt was not receptive to education and would do opposite of what was instructed               General ADL Comments: session limited with dressing practice and EOB due to n/v once sitting on side of bed     Vision Patient Visual Report: No change from baseline     Perception     Praxis      Cognition Arousal/Alertness: Awake/alert Behavior During Therapy: Flat affect Overall Cognitive Status: Impaired/Different from baseline Area of Impairment: Following commands;Safety/judgement;Awareness;Problem solving                       Following Commands: Follows one step commands with increased time;Follows multi-step commands with increased time Safety/Judgement: Decreased awareness of deficits;Decreased awareness of safety Awareness: Intellectual Problem Solving: Difficulty sequencing;Requires verbal cues General Comments: pt at times abrasive and rude with therapist. Needing multimodal redirection cues and cues for safety/education. Pt is resistive to therapist teachings  Exercises     Shoulder Instructions       General Comments pt with emesis this session. Pt had little warning of this and did not report complaints of nausea prior    Pertinent Vitals/ Pain       Pain Assessment: Faces Faces Pain Scale: Hurts a little bit Pain Location: RLE Pain Descriptors / Indicators: Sore Pain Intervention(s): Monitored during session;Repositioned  Home Living                                          Prior Functioning/Environment              Frequency  Min 2X/week        Progress Toward Goals  OT  Goals(current goals can now be found in the care plan section)  Progress towards OT goals: Progressing toward goals  Acute Rehab OT Goals Patient Stated Goal: to go home OT Goal Formulation: With patient Time For Goal Achievement: 11/05/19 Potential to Achieve Goals: Good  Plan Discharge plan needs to be updated    Co-evaluation                 AM-PAC OT "6 Clicks" Daily Activity     Outcome Measure   Help from another person eating meals?: A Little Help from another person taking care of personal grooming?: A Little Help from another person toileting, which includes using toliet, bedpan, or urinal?: A Lot Help from another person bathing (including washing, rinsing, drying)?: A Lot Help from another person to put on and taking off regular upper body clothing?: A Little Help from another person to put on and taking off regular lower body clothing?: A Lot 6 Click Score: 15    End of Session    OT Visit Diagnosis: Other abnormalities of gait and mobility (R26.89);Unsteadiness on feet (R26.81);Muscle weakness (generalized) (M62.81);Other symptoms and signs involving cognitive function;Pain Pain - Right/Left: Right Pain - part of body: Leg   Activity Tolerance Patient tolerated treatment well   Patient Left in bed;with call bell/phone within reach;Other (comment) (sitting EOB with RN administering zofran)   Nurse Communication Mobility status        Time: 5597-4163 OT Time Calculation (min): 10 min  Charges: OT General Charges $OT Visit: 1 Visit OT Treatments $Self Care/Home Management : 8-22 mins  Zenovia Jarred, MSOT, OTR/L [facility] Office Number: [phone] Pager: [phone]  Zenovia Jarred 10/24/2019, 4:47 PM

## 2019-10-25 LAB — CBC WITH DIFFERENTIAL/PLATELET
Abs Immature Granulocytes: 0.02 10*3/uL (ref 0.00–0.07)
Basophils Absolute: 0 10*3/uL (ref 0.0–0.1)
Basophils Relative: 1 %
Eosinophils Absolute: 0.2 10*3/uL (ref 0.0–0.5)
Eosinophils Relative: 3 %
HCT: 31.4 % — ABNORMAL LOW (ref 39.0–52.0)
Hemoglobin: 9.7 g/dL — ABNORMAL LOW (ref 13.0–17.0)
Immature Granulocytes: 0 %
Lymphocytes Relative: 32 %
Lymphs Abs: 2.2 10*3/uL (ref 0.7–4.0)
MCH: 26.2 pg (ref 26.0–34.0)
MCHC: 30.9 g/dL (ref 30.0–36.0)
MCV: 84.9 fL (ref 80.0–100.0)
Monocytes Absolute: 0.7 10*3/uL (ref 0.1–1.0)
Monocytes Relative: 10 %
Neutro Abs: 3.8 10*3/uL (ref 1.7–7.7)
Neutrophils Relative %: 54 %
Platelets: 468 10*3/uL — ABNORMAL HIGH (ref 150–400)
RBC: 3.7 MIL/uL — ABNORMAL LOW (ref 4.22–5.81)
RDW: 14.6 % (ref 11.5–15.5)
WBC: 6.9 10*3/uL (ref 4.0–10.5)
nRBC: 0 % (ref 0.0–0.2)

## 2019-10-25 LAB — COMPREHENSIVE METABOLIC PANEL
ALT: <5 U/L (ref 0–44)
AST: 13 U/L — ABNORMAL LOW (ref 15–41)
Albumin: 2.5 g/dL — ABNORMAL LOW (ref 3.5–5.0)
Alkaline Phosphatase: 77 U/L (ref 38–126)
Anion gap: 12 (ref 5–15)
BUN: 127 mg/dL — ABNORMAL HIGH (ref 6–20)
CO2: 27 mmol/L (ref 22–32)
Calcium: 10.4 mg/dL — ABNORMAL HIGH (ref 8.9–10.3)
Chloride: 100 mmol/L (ref 98–111)
Creatinine, Ser: 4.77 mg/dL — ABNORMAL HIGH (ref 0.61–1.24)
GFR calc Af Amer: 15 mL/min — ABNORMAL LOW (ref >60)
GFR calc non Af Amer: 13 mL/min — ABNORMAL LOW (ref >60)
Glucose, Bld: 167 mg/dL — ABNORMAL HIGH (ref 70–99)
Potassium: 5.1 mmol/L (ref 3.5–5.1)
Sodium: 139 mmol/L (ref 135–145)
Total Bilirubin: 0.3 mg/dL (ref 0.3–1.2)
Total Protein: 8.1 g/dL (ref 6.5–8.1)

## 2019-10-25 LAB — GLUCOSE, CAPILLARY
Glucose-Capillary: 126 mg/dL — ABNORMAL HIGH (ref 70–99)
Glucose-Capillary: 151 mg/dL — ABNORMAL HIGH (ref 70–99)
Glucose-Capillary: 206 mg/dL — ABNORMAL HIGH (ref 70–99)
Glucose-Capillary: 244 mg/dL — ABNORMAL HIGH (ref 70–99)
Glucose-Capillary: 251 mg/dL — ABNORMAL HIGH (ref 70–99)
Glucose-Capillary: 349 mg/dL — ABNORMAL HIGH (ref 70–99)

## 2019-10-25 LAB — PHOSPHORUS: Phosphorus: 7.1 mg/dL — ABNORMAL HIGH (ref 2.5–4.6)

## 2019-10-25 LAB — MAGNESIUM: Magnesium: 2.3 mg/dL (ref 1.7–2.4)

## 2019-10-25 MED ORDER — INSULIN ASPART 100 UNIT/ML ~~LOC~~ SOLN
12.0000 [IU] | Freq: Three times a day (TID) | SUBCUTANEOUS | Status: DC
  Administered 2019-10-26 – 2019-10-30 (×15): 12 [IU] via SUBCUTANEOUS

## 2019-10-25 NOTE — Progress Notes (Signed)
Pt continues to eat chips and other high carbohydrate foods. Presently asleep with spoon of food in mouth. bs 349. Monitoring continues.

## 2019-10-25 NOTE — Progress Notes (Signed)
PROGRESS NOTE    Zachary Young  IPJ:[id number] DOB: 09/08/1960 DOA: 10/17/2019 PCP: System, Pcp Not In     Brief Narrative:   59 y.o. BM PMHx CKD stage V, DM type II controlled with complication, Diabetic Neuropathy, Chronic systolic and diastolic CHF, HTN,   Presents  with pain who presented after passing out. Zachary Young notes that today, he went out on to his front porch to get some fresh air. Once out there, he became dizzy and had to lean against the house, and then he passed out. He slid down the side of the house and did not hit his head. He noted no chest pain, no fever, no chills, no change in urination. He does note that he has had a decreased appetite for the last 3-4 days, eating only about half of what he normally does. He has passed out before in the past when his sugar drops, and he thinks this may have happened today. He denies chest pain. He has SOB with ambulating at times, but nothing today. He report no change in urination and feels like he is urinating the normal amount. He reports taking torsemide 80mg  in the morning and evening (4 tablets) and not missing doses. He was having issues with pain in bed, and as I looked at his legs, I noted that his right leg was swollen, warm and mildly erythematous compared to the left. When asked, he noted a new wound on the plantar surface, at the 4th and 5th digit which he noticed first about 2 weeks ago.    Subjective: 7/11 A/O x4, negative CP, negative S OB.  Patient now states his sister will pick him up and take him to his cousin's.   Assessment & Plan: Covid vaccination; positive vaccination   Active Problems:   Hypertensive heart disease with CHF (congestive heart failure) (HCC)   Chronic kidney disease, stage 4 (severe) (HCC)   HLD (hyperlipidemia)   HTN (hypertension)   Systolic and diastolic CHF, chronic (HCC)   Tobacco use disorder   Type 2 diabetes mellitus with circulatory disorder, with long-term current  use of insulin (HCC)   Wound of right foot   Syncope and collapse   Cocaine abuse ([place])   Subacute osteomyelitis, right ankle and foot (HCC)   Abscess of right foot   Diabetic neuropathy (HCC)   Anemia of chronic disease   Severe obesity (BMI 35.0-39.9) with comorbidity ([place])   Metabolic acidosis   DM (diabetes mellitus), secondary, uncontrolled, with complications (HCC)   Elevated sedimentation rate   Elevated C-reactive protein (CRP)   Hypoalbuminemia   Goals of care, counseling/discussion   Palliative care by specialist   RIGHT foot wound -Plan is for Dr. Meridee Score to perform RIGHT BKA on 7/7 -7/7 s/p RIGHT transtibial amputation -Continue antibiotics per surgery wishes. -7/8 patient now has a brace to form the stump, continues with drain  DM type II uncontrolled with complication -7/3 hemoglobin A1c=8.8 -7/7 consult diabetic coordinator poorly controlled diabetic reeducate patient on diabetes. 7/7 consult diabetic nutrition;poorly controlled diabetic reeducate patient on diabetes. -7/8 patient insisted that he have additional snacks during the night even though it has been explained by numerous personnel that his diabetes is uncontrolled and this will affect his ability to heal. -Lantus 10 units qhs -7/10 decrease moderate SSI  -7/11 increase NovoLog 12 units qac   Syncope and collapse -likely multifactorial including overdiuresis, acute on chronic kidney disease and acute infection - refused telemetry - TTE from May was  very difficult given habitus  Chronic diastolic and systolic CHF -EF 50 to 81% by echocardiogram 08/16/2019 -Strict ins and out -5.5 L -Daily weight Filed Weights   10/22/19 0014 10/24/19 1822 10/25/19 0601  Weight: 133.4 kg 134.8 kg 132.3 kg  -Amlodipine 10 mg daily -Torsemide 40 mg BID  HTN (hypertension) - Continue amlodipine, torsemide as noted above  Acute onCKD, stage 4 (baseline CR = 3.7---> 4.7) Recent Labs  Lab 10/21/19 0459  10/22/19 0244 10/23/19 0509 10/24/19 0446 10/25/19 0202  CREATININE 4.66* 4.87* 5.20* 4.86* 4.77*  - Trend renal function -7/8 consulted Nephrology Dr. Jane Canary for evaluation of patient's continuing poor renal function.  I do not believe patient is good candidate for HD, but will defer to nephrology.  -Nephrology evaluated patient and concurs that patient is not an HD candidate  Anemia of chronic disease Recent Labs  Lab 10/21/19 0459 10/22/19 0244 10/23/19 0509 10/24/19 0446 10/25/19 0202  HGB 8.5* 8.7* 8.8* 9.0* 9.7*  -Anemia panel consistent with anemia of chronic disease.  Most likely secondary to his renal failure -Occult blood pending -Transfuse for hemoglobin<7  HLD (hyperlipidemia) -5/2 LDL= 50  Tobacco use disorder - Counseling prior to discharge  Cocaine abuse -7/4 urine drug screen positive for cocaine -Patient counseled on cessation  Obese -Body mass index is 37.53 kg/m. -See uncontrolled DM type II  Competent to make medical decision?/Dementia? -Patient is extremely belligerent with staff, and when requested that he exhibits some respect with staff, patient becomes more belligerent. -Patient with EXTREMELY poor understanding of his multiple medical problems.  Believes that he can go to [facility] and stay for 6 months at a rehab facility, at the same time initially interested in CIR. -Per patient lives in a camper without running water or electricity, therefore going home would be an unsafe environment.   -On multiple occasion nursing staff has requested consent to contact his family to help in decision-making for rehab patient has refused to allow staff to speak with family members, and after patient cursed out family members on the phone they are no longer answering his phone calls. -7/9 Per NP Letitia Libra psychiatry patient DOES NOt appear to have the capacity currently to determine disposition    Goals of care -7/7 consult to palliative care;-with history  of drug abuse, HTN, uncontrolled diabetes poor candidate for hemodialysis but is a CKD stage V patient.  Discuss changing status to DNR, home palliative care; addendum palliative care spoke with patient, per the note again patient demonstrated NO UNDERSTANDING of multiple medical problems, and was reluctant to address seriously goals of care. 7/8 consult psychiatry;Patient becomes very combative, with physicians and nursing staff.  In addition does not appear to understand his medical situation to include recent surgery, and lack of living conditions.  Unsure patient able to make his own medical decisions.  Please evaluate; IVC?    DVT prophylaxis: Lovenox Code Status: Full Family Communication: 7/7 family at bedside for discussion of plan of care Status is: Inpatient    Dispo: The patient is from: Home              Anticipated d/c is to: SNF              Anticipated d/c date is: Per surgery              Patient currently unstable      Consultants:  Dr. Meridee Score [facility] orthopedic surgery    Procedures/Significant Events:  7/7 RIGHT Transtibial amputation Application  of Prevena wound VAC    I have personally reviewed and interpreted all radiology studies and my findings are as above.  VENTILATOR SETTINGS:    Cultures 7/3 SARS coronavirus negative    Antimicrobials: Anti-infectives (From admission, onward)   Start     Ordered Stop   10/21/19 0600  ceFAZolin (ANCEF) 3 g in dextrose 5 % 50 mL IVPB        10/20/19 1810 10/21/19 1013   10/20/19 1000  vancomycin (VANCOREADY) IVPB 1250 mg/250 mL        10/20/19 0930 10/20/19 1143   10/17/19 2115  cefTRIAXone (ROCEPHIN) 2 g in sodium chloride 0.9 % 100 mL IVPB  Status:  Discontinued        10/17/19 2112 10/22/19 1603   10/17/19 2115  metroNIDAZOLE (FLAGYL) IVPB 500 mg  Status:  Discontinued        10/17/19 2112 10/22/19 1603   10/17/19 2115  vancomycin (VANCOCIN) 2,250 mg in sodium chloride 0.9 % 500 mL IVPB         10/17/19 2110 10/18/19 0209   10/17/19 2110  vancomycin variable dose per unstable renal function (pharmacist dosing)  Status:  Discontinued        10/17/19 2110 10/22/19 1603       Devices    LINES / TUBES:      Continuous Infusions:    Objective: Vitals:   10/24/19 1822 10/24/19 2328 10/25/19 0551 10/25/19 0601  BP: (!) 143/80 (!) 170/87 119/68   Pulse: 80  82   Resp: 16 18 17    Temp: 97.6 F (36.4 C) 98 F (36.7 C) 98 F (36.7 C)   TempSrc: Oral Oral Oral   SpO2: 100% 99% 98%   Weight: 134.8 kg   132.3 kg  Height:        Intake/Output Summary (Last 24 hours) at 10/25/2019 0720 Last data filed at 10/25/2019 0500 Gross per 24 hour  Intake 240 ml  Output 3500 ml  Net -3260 ml   Filed Weights   10/22/19 0014 10/24/19 1822 10/25/19 0601  Weight: 133.4 kg 134.8 kg 132.3 kg   Physical Exam:  General: A/O x4, no acute respiratory distress Eyes: negative scleral hemorrhage, negative anisocoria, negative icterus ENT: Negative Runny nose, negative gingival bleeding, Neck:  Negative scars, masses, torticollis, lymphadenopathy, JVD Lungs: Clear to auscultation bilaterally without wheezes or crackles Cardiovascular: Regular rate and rhythm without murmur gallop or rub normal S1 and S2 Abdomen: negative abdominal pain, nondistended, positive soft, bowel sounds, no rebound, no ascites, no appreciable mass Extremities: LEFT transtarsal amputation well-healed, RIGHT transtarsal amputation covered and clean with in a stump binder, wound VAC drain not putting out any drainage Skin: Negative rashes, lesions, ulcers Psychiatric:  Negative depression, negative anxiety, negative fatigue, negative mania  Central nervous system:  Cranial nerves II through XII intact, tongue/uvula midline, all extremities muscle strength 5/5, sensation intact throughout, negative dysarthria, negative expressive aphasia, negative receptive aphasia.  .     Data Reviewed: Care during the described  time interval was provided by me .  I have reviewed this patient's available data, including medical history, events of note, physical examination, and all test results as part of my evaluation.  CBC: Recent Labs  Lab 10/21/19 0459 10/22/19 0244 10/23/19 0509 10/24/19 0446 10/25/19 0202  WBC 5.5 8.6 8.0 7.9 6.9  NEUTROABS  --  6.7 4.4 4.0 3.8  HGB 8.5* 8.7* 8.8* 9.0* 9.7*  HCT 28.3* 28.7* 29.1* 29.1* 31.4*  MCV 85.5 84.7  84.6 85.3 84.9  PLT 435* 472* 410* 444* 268*   Basic Metabolic Panel: Recent Labs  Lab 10/21/19 0459 10/22/19 0244 10/23/19 0509 10/24/19 0446 10/25/19 0202  NA 140 136 136 139 139  K 4.3 4.9 4.6 5.1 5.1  CL 104 103 101 103 100  CO2 23 22 21* 24 27  GLUCOSE 179* 207* 148* 202* 167*  BUN 91* 93* 107* 121* 127*  CREATININE 4.66* 4.87* 5.20* 4.86* 4.77*  CALCIUM 8.9 9.2 9.4 9.8 10.4*  MG  --  1.8 2.0 2.1 2.3  PHOS  --  5.7* 7.2* 6.3* 7.1*   GFR: Estimated Creatinine Clearance: 25 mL/min (A) (by C-G formula based on SCr of 4.77 mg/dL (H)). Liver Function Tests: Recent Labs  Lab 10/18/19 0815 10/22/19 0244 10/23/19 0509 10/24/19 0446 10/25/19 0202  AST 11* 12* 14* 12* 13*  ALT 10 7 6 5  <5  ALKPHOS 84 88 76 87 77  BILITOT 0.5 0.5 0.5 0.3 0.3  PROT 8.1 8.1 8.1 7.8 8.1  ALBUMIN 2.3* 2.5* 2.6* 2.4* 2.5*   No results for input(s): LIPASE, AMYLASE in the last 168 hours. No results for input(s): AMMONIA in the last 168 hours. Coagulation Profile: No results for input(s): INR, PROTIME in the last 168 hours. Cardiac Enzymes: No results for input(s): CKTOTAL, CKMB, CKMBINDEX, TROPONINI in the last 168 hours. BNP (last 3 results) No results for input(s): PROBNP in the last 8760 hours. HbA1C: No results for input(s): HGBA1C in the last 72 hours. CBG: Recent Labs  Lab 10/24/19 1616 10/24/19 2043 10/24/19 2312 10/25/19 0417 10/25/19 0549  GLUCAP 155* 309* 293* 126* 151*   Lipid Profile: No results for input(s): CHOL, HDL, LDLCALC, TRIG, CHOLHDL,  LDLDIRECT in the last 72 hours. Thyroid Function Tests: No results for input(s): TSH, T4TOTAL, FREET4, T3FREE, THYROIDAB in the last 72 hours. Anemia Panel: No results for input(s): VITAMINB12, FOLATE, FERRITIN, TIBC, IRON, RETICCTPCT in the last 72 hours. Sepsis Labs: No results for input(s): PROCALCITON, LATICACIDVEN in the last 168 hours.  Recent Results (from the past 240 hour(s))  SARS Coronavirus 2 by RT PCR (hospital order, performed in [facility] hospital lab) Nasopharyngeal Nasopharyngeal Swab     Status: None   Collection Time: 10/17/19  6:49 PM   Specimen: Nasopharyngeal Swab  Result Value Ref Range Status   SARS Coronavirus 2 NEGATIVE NEGATIVE Final    Comment: (NOTE) SARS-CoV-2 target nucleic acids are NOT DETECTED.  The SARS-CoV-2 RNA is generally detectable in upper and lower respiratory specimens during the acute phase of infection. The lowest concentration of SARS-CoV-2 viral copies this assay can detect is 250 copies / mL. A negative result does not preclude SARS-CoV-2 infection and should not be used as the sole basis for treatment or other patient management decisions.  A negative result may occur with improper specimen collection / handling, submission of specimen other than nasopharyngeal swab, presence of viral mutation(s) within the areas targeted by this assay, and inadequate number of viral copies (<250 copies / mL). A negative result must be combined with clinical observations, patient history, and epidemiological information.  Fact Sheet for Patients:   StrictlyIdeas.no  Fact Sheet for Healthcare Providers: BankingDealers.co.za  This test is not yet approved or  cleared by the [place] FDA and has been authorized for detection and/or diagnosis of SARS-CoV-2 by FDA under an Emergency Use Authorization (EUA).  This EUA will remain in effect (meaning this test can be used) for the duration of  the COVID-19 declaration under  Section 564(b)(1) of the Act, 21 U.S.C. section 360bbb-3(b)(1), unless the authorization is terminated or revoked sooner.  Performed at [facility], [place] [address] [place], [place] [address]          Radiology Studies: No results found.      Scheduled Meds: . amLODipine  10 mg Oral Daily  . aspirin EC  81 mg Oral Daily  . collagenase   Topical Daily  . docusate sodium  100 mg Oral BID  . enoxaparin (LOVENOX) injection  30 mg Subcutaneous Q24H  . gabapentin  300 mg Oral Daily   And  . gabapentin  600 mg Oral QHS  . insulin aspart  0-15 Units Subcutaneous Q4H  . insulin aspart  8 Units Subcutaneous TID WC  . insulin glargine  10 Units Subcutaneous QHS  . multivitamin with minerals  1 tablet Oral Daily  . nutrition supplement (JUVEN)  1 packet Oral BID BM  . Ensure Max Protein  11 oz Oral BID  . sodium chloride flush  3 mL Intravenous Q12H  . torsemide  40 mg Oral BID   Continuous Infusions:    LOS: 8 days    Time spent:40 min    Yehudah Standing, Geraldo Docker, MD [facility] Pager [phone]  If 7PM-7AM, please contact night-coverage [email or web address] Password [facility] 10/25/2019, 7:20 AM

## 2019-10-26 DIAGNOSIS — E138 Other specified diabetes mellitus with unspecified complications: Secondary | ICD-10-CM | POA: Diagnosis not present

## 2019-10-26 DIAGNOSIS — F1410 Cocaine abuse, uncomplicated: Secondary | ICD-10-CM | POA: Diagnosis not present

## 2019-10-26 DIAGNOSIS — L02611 Cutaneous abscess of right foot: Secondary | ICD-10-CM | POA: Diagnosis not present

## 2019-10-26 DIAGNOSIS — R55 Syncope and collapse: Secondary | ICD-10-CM | POA: Diagnosis not present

## 2019-10-26 LAB — COMPREHENSIVE METABOLIC PANEL
ALT: 7 U/L (ref 0–44)
AST: 13 U/L — ABNORMAL LOW (ref 15–41)
Albumin: 2.7 g/dL — ABNORMAL LOW (ref 3.5–5.0)
Alkaline Phosphatase: 95 U/L (ref 38–126)
Anion gap: 11 (ref 5–15)
BUN: 131 mg/dL — ABNORMAL HIGH (ref 6–20)
CO2: 30 mmol/L (ref 22–32)
Calcium: 11.4 mg/dL — ABNORMAL HIGH (ref 8.9–10.3)
Chloride: 95 mmol/L — ABNORMAL LOW (ref 98–111)
Creatinine, Ser: 4.94 mg/dL — ABNORMAL HIGH (ref 0.61–1.24)
GFR calc Af Amer: 14 mL/min — ABNORMAL LOW (ref >60)
GFR calc non Af Amer: 12 mL/min — ABNORMAL LOW (ref >60)
Glucose, Bld: 211 mg/dL — ABNORMAL HIGH (ref 70–99)
Potassium: 5.6 mmol/L — ABNORMAL HIGH (ref 3.5–5.1)
Sodium: 136 mmol/L (ref 135–145)
Total Bilirubin: 0.4 mg/dL (ref 0.3–1.2)
Total Protein: 8.7 g/dL — ABNORMAL HIGH (ref 6.5–8.1)

## 2019-10-26 LAB — CBC WITH DIFFERENTIAL/PLATELET
Abs Immature Granulocytes: 0.02 10*3/uL (ref 0.00–0.07)
Basophils Absolute: 0 10*3/uL (ref 0.0–0.1)
Basophils Relative: 0 %
Eosinophils Absolute: 0.2 10*3/uL (ref 0.0–0.5)
Eosinophils Relative: 3 %
HCT: 34 % — ABNORMAL LOW (ref 39.0–52.0)
Hemoglobin: 10.4 g/dL — ABNORMAL LOW (ref 13.0–17.0)
Immature Granulocytes: 0 %
Lymphocytes Relative: 32 %
Lymphs Abs: 2.3 10*3/uL (ref 0.7–4.0)
MCH: 25.9 pg — ABNORMAL LOW (ref 26.0–34.0)
MCHC: 30.6 g/dL (ref 30.0–36.0)
MCV: 84.6 fL (ref 80.0–100.0)
Monocytes Absolute: 0.8 10*3/uL (ref 0.1–1.0)
Monocytes Relative: 11 %
Neutro Abs: 3.9 10*3/uL (ref 1.7–7.7)
Neutrophils Relative %: 54 %
Platelets: 497 10*3/uL — ABNORMAL HIGH (ref 150–400)
RBC: 4.02 MIL/uL — ABNORMAL LOW (ref 4.22–5.81)
RDW: 14.9 % (ref 11.5–15.5)
WBC: 7.2 10*3/uL (ref 4.0–10.5)
nRBC: 0 % (ref 0.0–0.2)

## 2019-10-26 LAB — GLUCOSE, CAPILLARY
Glucose-Capillary: 154 mg/dL — ABNORMAL HIGH (ref 70–99)
Glucose-Capillary: 161 mg/dL — ABNORMAL HIGH (ref 70–99)
Glucose-Capillary: 166 mg/dL — ABNORMAL HIGH (ref 70–99)
Glucose-Capillary: 168 mg/dL — ABNORMAL HIGH (ref 70–99)
Glucose-Capillary: 184 mg/dL — ABNORMAL HIGH (ref 70–99)
Glucose-Capillary: 307 mg/dL — ABNORMAL HIGH (ref 70–99)

## 2019-10-26 LAB — PHOSPHORUS: Phosphorus: 6.7 mg/dL — ABNORMAL HIGH (ref 2.5–4.6)

## 2019-10-26 LAB — MAGNESIUM: Magnesium: 2.5 mg/dL — ABNORMAL HIGH (ref 1.7–2.4)

## 2019-10-26 MED ORDER — SODIUM ZIRCONIUM CYCLOSILICATE 10 G PO PACK
10.0000 g | PACK | Freq: Two times a day (BID) | ORAL | Status: DC
  Administered 2019-10-26 – 2019-10-27 (×4): 10 g via ORAL
  Filled 2019-10-26 (×4): qty 1

## 2019-10-26 MED ORDER — INSULIN GLARGINE 100 UNIT/ML ~~LOC~~ SOLN
15.0000 [IU] | Freq: Every day | SUBCUTANEOUS | Status: DC
  Administered 2019-10-26: 15 [IU] via SUBCUTANEOUS
  Filled 2019-10-26 (×3): qty 0.15

## 2019-10-26 MED ORDER — SODIUM CHLORIDE 0.9 % IV BOLUS
500.0000 mL | Freq: Once | INTRAVENOUS | Status: AC
  Administered 2019-10-26: 500 mL via INTRAVENOUS

## 2019-10-26 NOTE — Progress Notes (Signed)
Patient sleeping  Will order for wound vac removel. Needs 1 week follow up after discharge

## 2019-10-26 NOTE — Progress Notes (Signed)
PROGRESS NOTE    Zachary Young  SPQ:[id number] DOB: 19-Nov-1960 DOA: 10/17/2019 PCP: System, Pcp Not In     Brief Narrative:   59 y.o. BM PMHx CKD stage V, DM type II controlled with complication, Diabetic Neuropathy, Chronic systolic and diastolic CHF, HTN,   Presents  with pain who presented after passing out. Zachary Young notes that today, he went out on to his front porch to get some fresh air. Once out there, he became dizzy and had to lean against the house, and then he passed out. He slid down the side of the house and did not hit his head. He noted no chest pain, no fever, no chills, no change in urination. He does note that he has had a decreased appetite for the last 3-4 days, eating only about half of what he normally does. He has passed out before in the past when his sugar drops, and he thinks this may have happened today. He denies chest pain. He has SOB with ambulating at times, but nothing today. He report no change in urination and feels like he is urinating the normal amount. He reports taking torsemide 80mg  in the morning and evening (4 tablets) and not missing doses. He was having issues with pain in bed, and as I looked at his legs, I noted that his right leg was swollen, warm and mildly erythematous compared to the left. When asked, he noted a new wound on the plantar surface, at the 4th and 5th digit which he noticed first about 2 weeks ago.    Subjective: 7/12 A/O x4, negative CP, negative S OB.  Patient now states brother Dominica Severin may allow him to live at his cousin's house.  Will have social work check out new story in the a.m.    Assessment & Plan: Covid vaccination; positive vaccination   Active Problems:   Hypertensive heart disease with CHF (congestive heart failure) (HCC)   Chronic kidney disease, stage 4 (severe) (HCC)   HLD (hyperlipidemia)   HTN (hypertension)   Systolic and diastolic CHF, chronic (HCC)   Tobacco use disorder   Type 2 diabetes  mellitus with circulatory disorder, with long-term current use of insulin (HCC)   Wound of right foot   Syncope and collapse   Cocaine abuse ([place])   Subacute osteomyelitis, right ankle and foot (HCC)   Abscess of right foot   Diabetic neuropathy (HCC)   Anemia of chronic disease   Severe obesity (BMI 35.0-39.9) with comorbidity ([place])   Metabolic acidosis   DM (diabetes mellitus), secondary, uncontrolled, with complications (HCC)   Elevated sedimentation rate   Elevated C-reactive protein (CRP)   Hypoalbuminemia   Goals of care, counseling/discussion   Palliative care by specialist   RIGHT foot wound -Plan is for Dr. Meridee Score to perform RIGHT BKA on 7/7 -7/7 s/p RIGHT transtibial amputation -Continue antibiotics per surgery wishes. -7/8 patient now has a brace to form the stump, continues with drain  DM type II uncontrolled with complication -7/3 hemoglobin A1c=8.8 -7/7 consult diabetic coordinator poorly controlled diabetic reeducate patient on diabetes. 7/7 consult diabetic nutrition;poorly controlled diabetic reeducate patient on diabetes. -7/8 patient insisted that he have additional snacks during the night even though it has been explained by numerous personnel that his diabetes is uncontrolled and this will affect his ability to heal. -7/10 decrease moderate SSI  -7/11 increase NovoLog 12 units qac  -7/12 increase Lantus 15 units qhs  Syncope and collapse -likely multifactorial including overdiuresis, acute on  chronic kidney disease and acute infection - refused telemetry - TTE from May was very difficult given habitus  Chronic diastolic and systolic CHF -EF 50 to 70% by echocardiogram 08/16/2019 -Strict ins and out -5.5 L -Daily weight Filed Weights   10/22/19 0014 10/24/19 1822 10/25/19 0601  Weight: 133.4 kg 134.8 kg 132.3 kg  -Amlodipine 10 mg daily -7/12 Torsemide 40 mg BID (hold) increasing creatinine -7/12 bolus normal saline 595ml  HTN  (hypertension) -See CHF  Acute onCKD, stage 4 (baseline CR = 3.7---> 4.7) Recent Labs  Lab 10/22/19 0244 10/23/19 0509 10/24/19 0446 10/25/19 0202 10/26/19 0347  CREATININE 4.87* 5.20* 4.86* 4.77* 4.94*  - Trend renal function -7/8 consulted Nephrology Dr. Jane Canary for evaluation of patient's continuing poor renal function.  I do not believe patient is good candidate for HD, but will defer to nephrology.  -Nephrology evaluated patient and concurs that patient is not an HD candidate  Anemia of chronic disease Recent Labs  Lab 10/22/19 0244 10/23/19 0509 10/24/19 0446 10/25/19 0202 10/26/19 0347  HGB 8.7* 8.8* 9.0* 9.7* 10.4*  -Anemia panel consistent with anemia of chronic disease.  Most likely secondary to his renal failure -Occult blood pending -Transfuse for hemoglobin<7  HLD (hyperlipidemia) -5/2 LDL= 50  Hyperkalemia -Lokelma 10 g x 2 doses   Tobacco use disorder - Counseling prior to discharge  Cocaine abuse -7/4 urine drug screen positive for cocaine -Patient counseled on cessation  Obese -Body mass index is 37.53 kg/m. -See uncontrolled DM type II  Competent to make medical decision?/Dementia? -Patient is extremely belligerent with staff, and when requested that he exhibits some respect with staff, patient becomes more belligerent. -Patient with EXTREMELY poor understanding of his multiple medical problems.  Believes that he can go to [facility] and stay for 6 months at a rehab facility, at the same time initially interested in CIR. -Per patient lives in a camper without running water or electricity, therefore going home would be an unsafe environment.   -On multiple occasion nursing staff has requested consent to contact his family to help in decision-making for rehab patient has refused to allow staff to speak with family members, and after patient cursed out family members on the phone they are no longer answering his phone calls. -7/9 Per NP Letitia Libra  psychiatry patient DOES NOt appear to have the capacity currently to determine disposition    Goals of care -7/7 consult to palliative care;-with history of drug abuse, HTN, uncontrolled diabetes poor candidate for hemodialysis but is a CKD stage V patient.  Discuss changing status to DNR, home palliative care; addendum palliative care spoke with patient, per the note again patient demonstrated NO UNDERSTANDING of multiple medical problems, and was reluctant to address seriously goals of care. 7/8 consult psychiatry;Patient becomes very combative, with physicians and nursing staff.  In addition does not appear to understand his medical situation to include recent surgery, and lack of living conditions.  Unsure patient able to make his own medical decisions.  Please evaluate; IVC?    DVT prophylaxis: Lovenox Code Status: Full Family Communication: 7/7 family at bedside for discussion of plan of care Status is: Inpatient    Dispo: The patient is from: Home              Anticipated d/c is to: SNF              Anticipated d/c date is: Per surgery  Patient currently unstable      Consultants:  Dr. Meridee Score [facility] orthopedic surgery    Procedures/Significant Events:  7/7 RIGHT Transtibial amputation Application of Prevena wound VAC    I have personally reviewed and interpreted all radiology studies and my findings are as above.  VENTILATOR SETTINGS:    Cultures 7/3 SARS coronavirus negative    Antimicrobials: Anti-infectives (From admission, onward)   Start     Ordered Stop   10/21/19 0600  ceFAZolin (ANCEF) 3 g in dextrose 5 % 50 mL IVPB        10/20/19 1810 10/21/19 1013   10/20/19 1000  vancomycin (VANCOREADY) IVPB 1250 mg/250 mL        10/20/19 0930 10/20/19 1143   10/17/19 2115  cefTRIAXone (ROCEPHIN) 2 g in sodium chloride 0.9 % 100 mL IVPB  Status:  Discontinued        10/17/19 2112 10/22/19 1603   10/17/19 2115  metroNIDAZOLE (FLAGYL) IVPB  500 mg  Status:  Discontinued        10/17/19 2112 10/22/19 1603   10/17/19 2115  vancomycin (VANCOCIN) 2,250 mg in sodium chloride 0.9 % 500 mL IVPB        10/17/19 2110 10/18/19 0209   10/17/19 2110  vancomycin variable dose per unstable renal function (pharmacist dosing)  Status:  Discontinued        10/17/19 2110 10/22/19 1603       Devices    LINES / TUBES:      Continuous Infusions:    Objective: Vitals:   10/25/19 0551 10/25/19 0601 10/25/19 1012 10/26/19 0015  BP: 119/68  129/78 (!) 158/65  Pulse: 82  81 89  Resp: 17  18 16   Temp: 98 F (36.7 C)  98.5 F (36.9 C) 98.2 F (36.8 C)  TempSrc: Oral  Oral Oral  SpO2: 98%  100% 97%  Weight:  132.3 kg    Height:        Intake/Output Summary (Last 24 hours) at 10/26/2019 0749 Last data filed at 10/26/2019 0960 Gross per 24 hour  Intake 840 ml  Output 3350 ml  Net -2510 ml   Filed Weights   10/22/19 0014 10/24/19 1822 10/25/19 0601  Weight: 133.4 kg 134.8 kg 132.3 kg   Physical Exam:  General: A/O x4, no acute respiratory distress Eyes: negative scleral hemorrhage, negative anisocoria, negative icterus ENT: Negative Runny nose, negative gingival bleeding, Neck:  Negative scars, masses, torticollis, lymphadenopathy, JVD Lungs: Clear to auscultation bilaterally without wheezes or crackles Cardiovascular: Regular rate and rhythm without murmur gallop or rub normal S1 and S2 Abdomen: negative abdominal pain, nondistended, positive soft, bowel sounds, no rebound, no ascites, no appreciable mass Extremities: LEFT transtarsal amputation well-healed, RIGHT transtarsal amputation covered and clean with in a stump binder, wound VAC drain not putting out any drainage Skin: Negative rashes, lesions, ulcers Psychiatric:  Negative depression, negative anxiety, negative fatigue, negative mania  Central nervous system:  Cranial nerves II through XII intact, tongue/uvula midline, all extremities muscle strength 5/5, sensation  intact throughout, negative dysarthria, negative expressive aphasia, negative receptive aphasia.  .     Data Reviewed: Care during the described time interval was provided by me .  I have reviewed this patient's available data, including medical history, events of note, physical examination, and all test results as part of my evaluation.  CBC: Recent Labs  Lab 10/22/19 0244 10/23/19 0509 10/24/19 0446 10/25/19 0202 10/26/19 0347  WBC 8.6 8.0 7.9 6.9 7.2  NEUTROABS  6.7 4.4 4.0 3.8 3.9  HGB 8.7* 8.8* 9.0* 9.7* 10.4*  HCT 28.7* 29.1* 29.1* 31.4* 34.0*  MCV 84.7 84.6 85.3 84.9 84.6  PLT 472* 410* 444* 468* 767*   Basic Metabolic Panel: Recent Labs  Lab 10/22/19 0244 10/23/19 0509 10/24/19 0446 10/25/19 0202 10/26/19 0347  NA 136 136 139 139 136  K 4.9 4.6 5.1 5.1 5.6*  CL 103 101 103 100 95*  CO2 22 21* 24 27 30   GLUCOSE 207* 148* 202* 167* 211*  BUN 93* 107* 121* 127* 131*  CREATININE 4.87* 5.20* 4.86* 4.77* 4.94*  CALCIUM 9.2 9.4 9.8 10.4* 11.4*  MG 1.8 2.0 2.1 2.3 2.5*  PHOS 5.7* 7.2* 6.3* 7.1* 6.7*   GFR: Estimated Creatinine Clearance: 24.2 mL/min (A) (by C-G formula based on SCr of 4.94 mg/dL (H)). Liver Function Tests: Recent Labs  Lab 10/22/19 0244 10/23/19 0509 10/24/19 0446 10/25/19 0202 10/26/19 0347  AST 12* 14* 12* 13* 13*  ALT 7 6 5  <5 7  ALKPHOS 88 76 87 77 95  BILITOT 0.5 0.5 0.3 0.3 0.4  PROT 8.1 8.1 7.8 8.1 8.7*  ALBUMIN 2.5* 2.6* 2.4* 2.5* 2.7*   No results for input(s): LIPASE, AMYLASE in the last 168 hours. No results for input(s): AMMONIA in the last 168 hours. Coagulation Profile: No results for input(s): INR, PROTIME in the last 168 hours. Cardiac Enzymes: No results for input(s): CKTOTAL, CKMB, CKMBINDEX, TROPONINI in the last 168 hours. BNP (last 3 results) No results for input(s): PROBNP in the last 8760 hours. HbA1C: No results for input(s): HGBA1C in the last 72 hours. CBG: Recent Labs  Lab 10/25/19 1352 10/25/19 1942  10/25/19 2346 10/26/19 0403 10/26/19 0647  GLUCAP 244* 251* 349* 184* 168*   Lipid Profile: No results for input(s): CHOL, HDL, LDLCALC, TRIG, CHOLHDL, LDLDIRECT in the last 72 hours. Thyroid Function Tests: No results for input(s): TSH, T4TOTAL, FREET4, T3FREE, THYROIDAB in the last 72 hours. Anemia Panel: No results for input(s): VITAMINB12, FOLATE, FERRITIN, TIBC, IRON, RETICCTPCT in the last 72 hours. Sepsis Labs: No results for input(s): PROCALCITON, LATICACIDVEN in the last 168 hours.  Recent Results (from the past 240 hour(s))  SARS Coronavirus 2 by RT PCR (hospital order, performed in [facility] hospital lab) Nasopharyngeal Nasopharyngeal Swab     Status: None   Collection Time: 10/17/19  6:49 PM   Specimen: Nasopharyngeal Swab  Result Value Ref Range Status   SARS Coronavirus 2 NEGATIVE NEGATIVE Final    Comment: (NOTE) SARS-CoV-2 target nucleic acids are NOT DETECTED.  The SARS-CoV-2 RNA is generally detectable in upper and lower respiratory specimens during the acute phase of infection. The lowest concentration of SARS-CoV-2 viral copies this assay can detect is 250 copies / mL. A negative result does not preclude SARS-CoV-2 infection and should not be used as the sole basis for treatment or other patient management decisions.  A negative result may occur with improper specimen collection / handling, submission of specimen other than nasopharyngeal swab, presence of viral mutation(s) within the areas targeted by this assay, and inadequate number of viral copies (<250 copies / mL). A negative result must be combined with clinical observations, patient history, and epidemiological information.  Fact Sheet for Patients:   StrictlyIdeas.no  Fact Sheet for Healthcare Providers: BankingDealers.co.za  This test is not yet approved or  cleared by the [place] FDA and has been authorized for detection and/or diagnosis of  SARS-CoV-2 by FDA under an Emergency Use Authorization (EUA).  This  EUA will remain in effect (meaning this test can be used) for the duration of the COVID-19 declaration under Section 564(b)(1) of the Act, 21 U.S.C. section 360bbb-3(b)(1), unless the authorization is terminated or revoked sooner.  Performed at [facility], [address] [place],  [address]          Radiology Studies: No results found.      Scheduled Meds: . amLODipine  10 mg Oral Daily  . aspirin EC  81 mg Oral Daily  . collagenase   Topical Daily  . docusate sodium  100 mg Oral BID  . enoxaparin (LOVENOX) injection  30 mg Subcutaneous Q24H  . gabapentin  300 mg Oral Daily   And  . gabapentin  600 mg Oral QHS  . insulin aspart  0-15 Units Subcutaneous Q4H  . insulin aspart  12 Units Subcutaneous TID WC  . insulin glargine  10 Units Subcutaneous QHS  . multivitamin with minerals  1 tablet Oral Daily  . nutrition supplement (JUVEN)  1 packet Oral BID BM  . Ensure Max Protein  11 oz Oral BID  . sodium chloride flush  3 mL Intravenous Q12H  . torsemide  40 mg Oral BID   Continuous Infusions:    LOS: 9 days    Time spent:40 min    Daryll Spisak, Geraldo Docker, MD [facility] Pager [phone]  If 7PM-7AM, please contact night-coverage [email or web address] Password [facility] 10/26/2019, 7:49 AM

## 2019-10-27 DIAGNOSIS — R55 Syncope and collapse: Secondary | ICD-10-CM | POA: Diagnosis not present

## 2019-10-27 DIAGNOSIS — L02611 Cutaneous abscess of right foot: Secondary | ICD-10-CM | POA: Diagnosis not present

## 2019-10-27 DIAGNOSIS — E138 Other specified diabetes mellitus with unspecified complications: Secondary | ICD-10-CM | POA: Diagnosis not present

## 2019-10-27 DIAGNOSIS — F1410 Cocaine abuse, uncomplicated: Secondary | ICD-10-CM | POA: Diagnosis not present

## 2019-10-27 LAB — CBC WITH DIFFERENTIAL/PLATELET
Abs Immature Granulocytes: 0.02 10*3/uL (ref 0.00–0.07)
Basophils Absolute: 0 10*3/uL (ref 0.0–0.1)
Basophils Relative: 0 %
Eosinophils Absolute: 0.2 10*3/uL (ref 0.0–0.5)
Eosinophils Relative: 3 %
HCT: 33.1 % — ABNORMAL LOW (ref 39.0–52.0)
Hemoglobin: 10 g/dL — ABNORMAL LOW (ref 13.0–17.0)
Immature Granulocytes: 0 %
Lymphocytes Relative: 31 %
Lymphs Abs: 2.5 10*3/uL (ref 0.7–4.0)
MCH: 25.6 pg — ABNORMAL LOW (ref 26.0–34.0)
MCHC: 30.2 g/dL (ref 30.0–36.0)
MCV: 84.7 fL (ref 80.0–100.0)
Monocytes Absolute: 0.7 10*3/uL (ref 0.1–1.0)
Monocytes Relative: 9 %
Neutro Abs: 4.5 10*3/uL (ref 1.7–7.7)
Neutrophils Relative %: 57 %
Platelets: 477 10*3/uL — ABNORMAL HIGH (ref 150–400)
RBC: 3.91 MIL/uL — ABNORMAL LOW (ref 4.22–5.81)
RDW: 15 % (ref 11.5–15.5)
WBC: 7.9 10*3/uL (ref 4.0–10.5)
nRBC: 0 % (ref 0.0–0.2)

## 2019-10-27 LAB — GLUCOSE, CAPILLARY
Glucose-Capillary: 210 mg/dL — ABNORMAL HIGH (ref 70–99)
Glucose-Capillary: 228 mg/dL — ABNORMAL HIGH (ref 70–99)
Glucose-Capillary: 261 mg/dL — ABNORMAL HIGH (ref 70–99)
Glucose-Capillary: 215 mg/dL — ABNORMAL HIGH (ref 70–99)

## 2019-10-27 LAB — MAGNESIUM: Magnesium: 2.4 mg/dL (ref 1.7–2.4)

## 2019-10-27 LAB — PHOSPHORUS: Phosphorus: 6.6 mg/dL — ABNORMAL HIGH (ref 2.5–4.6)

## 2019-10-27 MED ORDER — INSULIN GLARGINE 100 UNIT/ML ~~LOC~~ SOLN
20.0000 [IU] | Freq: Every day | SUBCUTANEOUS | Status: DC
  Administered 2019-10-27 – 2019-11-07 (×12): 20 [IU] via SUBCUTANEOUS
  Filled 2019-10-27 (×14): qty 0.2

## 2019-10-27 NOTE — Progress Notes (Signed)
Pt alert and oriented x4. Pt refuses tp follow all saefty measures the hospital has in place. Pt assisted to bathroom but refuses to have staff in room within arm's length for supervision. Refuses to have bed and chair alarm on and refuses to have room door opened when unsupervised.

## 2019-10-27 NOTE — Care Management (Addendum)
Spoke to patient regarding his plan with Gary/cousin. Patient states he cannot stay with Dominica Severin. NCM discussed with patient he is medically ready for discharge and we need a plan.   Patient plans to stay with his brother Collier Salina at discharge. Patient consented for NCM to call Peter at [phone], called left message awaiting call back.   Patient aware and consented for NCM to call sister [name] at [phone]. Called spoke to [place].   Ann and her sister [name] have been working on a place for patient to stay. They are aware patient is medically ready for discharge. Patient has stayed with Collier Salina in past but [name] not sure if Collier Salina and his wife will allow him to stay again. Ann lives with her daughter and son in law and they will not allow Egbert to stay in the home.   [name] will discuss with her siblings a plan. [name] has NCM direct number.   [employer] [phone] regarding referral made last week. Left message awaiting call back.   1600 Received a call back from [facility] from [place]. Patient being denied admission. They feel his needs can be meet at a lower level.   [name] RN

## 2019-10-27 NOTE — Progress Notes (Signed)
Physical Therapy Treatment Patient Details Name: Zachary Young MRN: [id number] DOB: 1961/02/07 Today's Date: 10/27/2019    History of Present Illness Pt is a 59 y/o male s/p R transtibial amputation. PMH including but not limited to CHF, DM, HTN.    PT Comments    Pt needs frequent cues to focus on session and follow directions. Min A +2 for sit>stand for power up and balance. Pt hopped 5' fwd with RW and min A +2. Discussed the need for left shoe to cushion L foot during hopping. Pt performed amputee exercises in supine and sitting as well as reviewing proper positioning. PT will continue to follow.    Follow Up Recommendations  CIR     Equipment Recommendations  None recommended by PT    Recommendations for Other Services       Precautions / Restrictions Precautions Precautions: Fall Other Brace: R limb guard Restrictions Weight Bearing Restrictions: Yes RLE Weight Bearing: Non weight bearing    Mobility  Bed Mobility Overal bed mobility: Modified Independent             General bed mobility comments: increased time and effort  Transfers Overall transfer level: Needs assistance Equipment used: Rolling walker (2 wheeled) Transfers: Sit to/from Stand Sit to Stand: +2 physical assistance;Min assist         General transfer comment: pt cued to push from bed but he did not want to do this. Eventually did push with one hand when therapist would not stand in front of RW and push down on it. Min A +2 for power up and to steady  Ambulation/Gait Ambulation/Gait assistance: +2 safety/equipment;Min assist Gait Distance (Feet): 5 Feet Assistive device: Rolling walker (2 wheeled) Gait Pattern/deviations:  (hopping) Gait velocity: decreased Gait velocity interpretation: <1.31 ft/sec, indicative of household ambulator General Gait Details: pt hopped 5' fwd, cues for tricep activation and keeping RW close   Chief Strategy Officer    Modified  [place] (Stroke Patients Only)       Balance Overall balance assessment: Needs assistance Sitting-balance support: No upper extremity supported;Feet supported Sitting balance-Leahy Scale: Good     Standing balance support: Bilateral upper extremity supported Standing balance-Leahy Scale: Poor Standing balance comment: reliant on bilateral UEs on RW                            Cognition Arousal/Alertness: Awake/alert Behavior During Therapy: Flat affect Overall Cognitive Status: Impaired/Different from baseline Area of Impairment: Safety/judgement;Awareness                       Following Commands: Follows multi-step commands with increased time Safety/Judgement: Decreased awareness of deficits;Decreased awareness of safety Awareness: Intellectual   General Comments: pt continues to be somewhat resistive to instruction and easily angered      Exercises Amputee Exercises Quad Sets: AROM;Right;10 reps;Supine Gluteal Sets: AROM;Supine;10 reps Hip ABduction/ADduction: AROM;Right;10 reps;Supine Hip Flexion/Marching: AROM;Right;10 reps;Supine Knee Flexion: AROM;Right;10 reps;Seated Knee Extension: AROM;Right;10 reps;Seated Chair Push Up: AROM;10 reps;Seated    General Comments General comments (skin integrity, edema, etc.): discussed phantom pain mgmt as well as need for wearing shoe on L foot for cushion and protection, pt resistant to this      Pertinent Vitals/Pain Pain Assessment: Faces Faces Pain Scale: Hurts a little bit Pain Location: RLE Pain Descriptors / Indicators: Sore Pain Intervention(s): Limited activity within patient's tolerance;Monitored during  session    Home Living                      Prior Function            PT Goals (current goals can now be found in the care plan section) Acute Rehab PT Goals Patient Stated Goal: to go home PT Goal Formulation: With patient Time For Goal Achievement: 11/05/19 Potential to  Achieve Goals: Good Progress towards PT goals: Progressing toward goals    Frequency    Min 4X/week      PT Plan Current plan remains appropriate    Co-evaluation              AM-PAC PT "6 Clicks" Mobility   Outcome Measure  Help needed turning from your back to your side while in a flat bed without using bedrails?: None Help needed moving from lying on your back to sitting on the side of a flat bed without using bedrails?: None Help needed moving to and from a bed to a chair (including a wheelchair)?: A Lot Help needed standing up from a chair using your arms (e.g., wheelchair or bedside chair)?: A Lot Help needed to walk in hospital room?: A Lot Help needed climbing 3-5 steps with a railing? : Total 6 Click Score: 15    End of Session Equipment Utilized During Treatment: Gait belt Activity Tolerance: Patient tolerated treatment well Patient left: in chair;with call bell/phone within reach;with chair alarm set Nurse Communication: Mobility status PT Visit Diagnosis: Other abnormalities of gait and mobility (R26.89);Pain Pain - Right/Left: Right Pain - part of body: Leg     Time: 6073-7106 PT Time Calculation (min) (ACUTE ONLY): 25 min  Charges:  $Therapeutic Exercise: 8-22 mins $Therapeutic Activity: 8-22 mins                     [place]  Pager [phone] [place] 10/27/2019, 4:48 PM

## 2019-10-27 NOTE — Progress Notes (Signed)
PROGRESS NOTE    Zachary Young  OEV:[id number] DOB: 04/01/1961 DOA: 10/17/2019 PCP: System, Pcp Not In     Brief Narrative:   59 y.o. BM PMHx CKD stage V, DM type II controlled with complication, Diabetic Neuropathy, Chronic systolic and diastolic CHF, HTN,   Presents  with pain who presented after passing out. Zachary Young notes that today, he went out on to his front porch to get some fresh air. Once out there, he became dizzy and had to lean against the house, and then he passed out. He slid down the side of the house and did not hit his head. He noted no chest pain, no fever, no chills, no change in urination. He does note that he has had a decreased appetite for the last 3-4 days, eating only about half of what he normally does. He has passed out before in the past when his sugar drops, and he thinks this may have happened today. He denies chest pain. He has SOB with ambulating at times, but nothing today. He report no change in urination and feels like he is urinating the normal amount. He reports taking torsemide 80mg  in the morning and evening (4 tablets) and not missing doses. He was having issues with pain in bed, and as I looked at his legs, I noted that his right leg was swollen, warm and mildly erythematous compared to the left. When asked, he noted a new wound on the plantar surface, at the 4th and 5th digit which he noticed first about 2 weeks ago.    Subjective: 7/13 afebrile overnight A/O x4, patient spoke with his niece she has refused to allow him to live in her house.  Negative CP, negative S OB   Assessment & Plan: Covid vaccination; positive vaccination   Active Problems:   Hypertensive heart disease with CHF (congestive heart failure) (HCC)   Chronic kidney disease, stage 4 (severe) (HCC)   HLD (hyperlipidemia)   HTN (hypertension)   Systolic and diastolic CHF, chronic (HCC)   Tobacco use disorder   Type 2 diabetes mellitus with circulatory disorder, with  long-term current use of insulin (HCC)   Wound of right foot   Syncope and collapse   Cocaine abuse ([place])   Subacute osteomyelitis, right ankle and foot (HCC)   Abscess of right foot   Diabetic neuropathy (HCC)   Anemia of chronic disease   Severe obesity (BMI 35.0-39.9) with comorbidity ([place])   Metabolic acidosis   DM (diabetes mellitus), secondary, uncontrolled, with complications (HCC)   Elevated sedimentation rate   Elevated C-reactive protein (CRP)   Hypoalbuminemia   Goals of care, counseling/discussion   Palliative care by specialist   RIGHT foot wound -Plan is for Zachary Young to perform RIGHT BKA on 7/7 -7/7 s/p RIGHT transtibial amputation -Continue antibiotics per surgery wishes. -7/8 patient now has a brace to form the stump, continues with drain  DM type II uncontrolled with complication -7/3 hemoglobin A1c=8.8 -7/7 consult diabetic coordinator poorly controlled diabetic reeducate patient on diabetes. 7/7 consult diabetic nutrition;poorly controlled diabetic reeducate patient on diabetes. -7/8 patient insisted that he have additional snacks during the night even though it has been explained by numerous personnel that his diabetes is uncontrolled and this will affect his ability to heal. -7/10 decrease moderate SSI  -7/11 increase NovoLog 12 units qac  -7/13 increase Lantus 20 units qhs    Syncope and collapse -likely multifactorial including overdiuresis, acute on chronic kidney disease and acute infection -  refused telemetry - TTE from May was very difficult given habitus  Chronic diastolic and systolic CHF -EF 50 to 67% by echocardiogram 08/16/2019 -Strict ins and out -8.4 L -Daily weight Filed Weights   10/24/19 1822 10/25/19 0601 10/26/19 0500  Weight: 134.8 kg 132.3 kg 132.3 kg  -Amlodipine 10 mg daily -7/12 Torsemide 40 mg BID (hold) increasing creatinine -7/12 bolus normal saline 559ml  HTN (hypertension) -See CHF  Acute onCKD, stage 4  (baseline CR = 3.7---> 4.7) Recent Labs  Lab 10/22/19 0244 10/23/19 0509 10/24/19 0446 10/25/19 0202 10/26/19 0347  CREATININE 4.87* 5.20* 4.86* 4.77* 4.94*  - Trend renal function -7/8 consulted Nephrology Zachary Young for evaluation of patient's continuing poor renal function.  I do not believe patient is good candidate for HD, but will defer to nephrology.  -Nephrology evaluated patient and concurs that patient is not an HD candidate  Anemia of chronic disease Recent Labs  Lab 10/23/19 0509 10/24/19 0446 10/25/19 0202 10/26/19 0347 10/27/19 0623  HGB 8.8* 9.0* 9.7* 10.4* 10.0*  -Anemia panel consistent with anemia of chronic disease.  Most likely secondary to his renal failure -Occult blood pending -Transfuse for hemoglobin<7  HLD (hyperlipidemia) -5/2 LDL= 50  Hyperkalemia -7/12 Lokelma 10 g x 2 doses  Tobacco use disorder - Counseling prior to discharge  Cocaine abuse -7/4 urine drug screen positive for cocaine -Patient counseled on cessation  Obese -Body mass index is 37.53 kg/m. -See uncontrolled DM type II  Competent to make medical decision?/Dementia? -Patient is extremely belligerent with staff, and when requested that he exhibits some respect with staff, patient becomes more belligerent. -Patient with EXTREMELY poor understanding of his multiple medical problems.  Believes that he can go to [facility] and stay for 6 months at a rehab facility, at the same time initially interested in CIR. -Per patient lives in a camper without running water or electricity, therefore going home would be an unsafe environment.   -On multiple occasion nursing staff has requested consent to contact his family to help in decision-making for rehab patient has refused to allow staff to speak with family members, and after patient cursed out family members on the phone they are no longer answering his phone calls. -7/9 Per NP Zachary Young psychiatry patient DOES NOt appear to have the  capacity currently to determine disposition    Goals of care -7/7 consult to palliative care;-with history of drug abuse, HTN, uncontrolled diabetes poor candidate for hemodialysis but is a CKD stage V patient.  Discuss changing status to DNR, home palliative care; addendum palliative care spoke with patient, per the note again patient demonstrated NO UNDERSTANDING of multiple medical problems, and was reluctant to address seriously goals of care. 7/8 consult psychiatry;Patient becomes very combative, with physicians and nursing staff.  In addition does not appear to understand his medical situation to include recent surgery, and lack of living conditions.  Unsure patient able to make his own medical decisions.  Please evaluate; IVC?    DVT prophylaxis: Lovenox Code Status: Full Family Communication: 7/7 family at bedside for discussion of plan of care Status is: Inpatient    Dispo: The patient is from: Home              Anticipated d/c is to: SNF              Anticipated d/c date is: Per surgery              Patient currently unstable  Consultants:  Zachary Young [facility] orthopedic surgery    Procedures/Significant Events:  7/7 RIGHT Transtibial amputation Application of Prevena wound VAC    I have personally reviewed and interpreted all radiology studies and my findings are as above.  VENTILATOR SETTINGS:    Cultures 7/3 SARS coronavirus negative    Antimicrobials: Anti-infectives (From admission, onward)   Start     Ordered Stop   10/21/19 0600  ceFAZolin (ANCEF) 3 g in dextrose 5 % 50 mL IVPB        10/20/19 1810 10/21/19 1013   10/20/19 1000  vancomycin (VANCOREADY) IVPB 1250 mg/250 mL        10/20/19 0930 10/20/19 1143   10/17/19 2115  cefTRIAXone (ROCEPHIN) 2 g in sodium chloride 0.9 % 100 mL IVPB  Status:  Discontinued        10/17/19 2112 10/22/19 1603   10/17/19 2115  metroNIDAZOLE (FLAGYL) IVPB 500 mg  Status:  Discontinued        10/17/19  2112 10/22/19 1603   10/17/19 2115  vancomycin (VANCOCIN) 2,250 mg in sodium chloride 0.9 % 500 mL IVPB        10/17/19 2110 10/18/19 0209   10/17/19 2110  vancomycin variable dose per unstable renal function (pharmacist dosing)  Status:  Discontinued        10/17/19 2110 10/22/19 1603       Devices    LINES / TUBES:      Continuous Infusions:    Objective: Vitals:   10/26/19 1303 10/26/19 1747 10/27/19 0035 10/27/19 0514  BP: 125/74 118/77 123/77 131/75  Pulse: 94 86 87 77  Resp: 16 16 16 17   Temp: 97.9 F (36.6 C) (!) 97.4 F (36.3 C) (!) 97.5 F (36.4 C) 97.8 F (36.6 C)  TempSrc: Oral Oral Oral Oral  SpO2: 91% 99% 98% 98%  Weight:      Height:        Intake/Output Summary (Last 24 hours) at 10/27/2019 1245 Last data filed at 10/27/2019 1005 Gross per 24 hour  Intake 538 ml  Output 1300 ml  Net -762 ml   Filed Weights   10/24/19 1822 10/25/19 0601 10/26/19 0500  Weight: 134.8 kg 132.3 kg 132.3 kg   Physical Exam:  General: A/O x4, No acute respiratory distress Eyes: negative scleral hemorrhage, negative anisocoria, negative icterus ENT: Negative Runny nose, negative gingival bleeding, Neck:  Negative scars, masses, torticollis, lymphadenopathy, JVD Lungs: Clear to auscultation bilaterally without wheezes or crackles Cardiovascular: Regular rate and rhythm without murmur gallop or rub normal S1 and S2 Abdomen: negative abdominal pain, nondistended, positive soft, bowel sounds, no rebound, no ascites, no appreciable mass Extremities: LEFT transtarsal amputation well-healed, RIGHT transtarsal amputation covered and clean with in a stump binder,  Skin: Negative rashes, lesions, ulcers Psychiatric:  Negative depression, negative anxiety, negative fatigue, negative mania  Central nervous system:  Cranial nerves II through XII intact, tongue/uvula midline, all extremities muscle strength 5/5, sensation intact throughout, negative dysarthria, negative expressive  aphasia, negative receptive aphasia. .     Data Reviewed: Care during the described time interval was provided by me .  I have reviewed this patient's available data, including medical history, events of note, physical examination, and all test results as part of my evaluation.  CBC: Recent Labs  Lab 10/23/19 0509 10/24/19 0446 10/25/19 0202 10/26/19 0347 10/27/19 0623  WBC 8.0 7.9 6.9 7.2 7.9  NEUTROABS 4.4 4.0 3.8 3.9 4.5  HGB 8.8* 9.0* 9.7* 10.4* 10.0*  HCT 29.1* 29.1* 31.4* 34.0* 33.1*  MCV 84.6 85.3 84.9 84.6 84.7  PLT 410* 444* 468* 497* 774*   Basic Metabolic Panel: Recent Labs  Lab 10/22/19 0244 10/22/19 0244 10/23/19 0509 10/24/19 0446 10/25/19 0202 10/26/19 0347 10/27/19 0623  NA 136  --  136 139 139 136  --   K 4.9  --  4.6 5.1 5.1 5.6*  --   CL 103  --  101 103 100 95*  --   CO2 22  --  21* 24 27 30   --   GLUCOSE 207*  --  148* 202* 167* 211*  --   BUN 93*  --  107* 121* 127* 131*  --   CREATININE 4.87*  --  5.20* 4.86* 4.77* 4.94*  --   CALCIUM 9.2  --  9.4 9.8 10.4* 11.4*  --   MG 1.8   < > 2.0 2.1 2.3 2.5* 2.4  PHOS 5.7*   < > 7.2* 6.3* 7.1* 6.7* 6.6*   < > = values in this interval not displayed.   GFR: Estimated Creatinine Clearance: 24.2 mL/min (A) (by C-G formula based on SCr of 4.94 mg/dL (H)). Liver Function Tests: Recent Labs  Lab 10/22/19 0244 10/23/19 0509 10/24/19 0446 10/25/19 0202 10/26/19 0347  AST 12* 14* 12* 13* 13*  ALT 7 6 5  <5 7  ALKPHOS 88 76 87 77 95  BILITOT 0.5 0.5 0.3 0.3 0.4  PROT 8.1 8.1 7.8 8.1 8.7*  ALBUMIN 2.5* 2.6* 2.4* 2.5* 2.7*   No results for input(s): LIPASE, AMYLASE in the last 168 hours. No results for input(s): AMMONIA in the last 168 hours. Coagulation Profile: No results for input(s): INR, PROTIME in the last 168 hours. Cardiac Enzymes: No results for input(s): CKTOTAL, CKMB, CKMBINDEX, TROPONINI in the last 168 hours. BNP (last 3 results) No results for input(s): PROBNP in the last 8760  hours. HbA1C: No results for input(s): HGBA1C in the last 72 hours. CBG: Recent Labs  Lab 10/26/19 1540 10/26/19 2015 10/26/19 2337 10/27/19 0514 10/27/19 1119  GLUCAP 307* 154* 161* 210* 228*   Lipid Profile: No results for input(s): CHOL, HDL, LDLCALC, TRIG, CHOLHDL, LDLDIRECT in the last 72 hours. Thyroid Function Tests: No results for input(s): TSH, T4TOTAL, FREET4, T3FREE, THYROIDAB in the last 72 hours. Anemia Panel: No results for input(s): VITAMINB12, FOLATE, FERRITIN, TIBC, IRON, RETICCTPCT in the last 72 hours. Sepsis Labs: No results for input(s): PROCALCITON, LATICACIDVEN in the last 168 hours.  Recent Results (from the past 240 hour(s))  SARS Coronavirus 2 by RT PCR (hospital order, performed in [facility] hospital lab) Nasopharyngeal Nasopharyngeal Swab     Status: None   Collection Time: 10/17/19  6:49 PM   Specimen: Nasopharyngeal Swab  Result Value Ref Range Status   SARS Coronavirus 2 NEGATIVE NEGATIVE Final    Comment: (NOTE) SARS-CoV-2 target nucleic acids are NOT DETECTED.  The SARS-CoV-2 RNA is generally detectable in upper and lower respiratory specimens during the acute phase of infection. The lowest concentration of SARS-CoV-2 viral copies this assay can detect is 250 copies / mL. A negative result does not preclude SARS-CoV-2 infection and should not be used as the sole basis for treatment or other patient management decisions.  A negative result may occur with improper specimen collection / handling, submission of specimen other than nasopharyngeal swab, presence of viral mutation(s) within the areas targeted by this assay, and inadequate number of viral copies (<250 copies / mL). A negative result must be  combined with clinical observations, patient history, and epidemiological information.  Fact Sheet for Patients:   StrictlyIdeas.no  Fact Sheet for Healthcare  Providers: BankingDealers.co.za  This test is not yet approved or  cleared by the [place] FDA and has been authorized for detection and/or diagnosis of SARS-CoV-2 by FDA under an Emergency Use Authorization (EUA).  This EUA will remain in effect (meaning this test can be used) for the duration of the COVID-19 declaration under Section 564(b)(1) of the Act, 21 U.S.C. section 360bbb-3(b)(1), unless the authorization is terminated or revoked sooner.  Performed at [facility], [place] [address] [place], [place] [address]          Radiology Studies: No results found.      Scheduled Meds: . amLODipine  10 mg Oral Daily  . aspirin EC  81 mg Oral Daily  . collagenase   Topical Daily  . docusate sodium  100 mg Oral BID  . enoxaparin (LOVENOX) injection  30 mg Subcutaneous Q24H  . gabapentin  300 mg Oral Daily   And  . gabapentin  600 mg Oral QHS  . insulin aspart  0-15 Units Subcutaneous Q4H  . insulin aspart  12 Units Subcutaneous TID WC  . insulin glargine  15 Units Subcutaneous QHS  . multivitamin with minerals  1 tablet Oral Daily  . nutrition supplement (JUVEN)  1 packet Oral BID BM  . Ensure Max Protein  11 oz Oral BID  . sodium chloride flush  3 mL Intravenous Q12H  . sodium zirconium cyclosilicate  10 g Oral BID  . torsemide  40 mg Oral BID   Continuous Infusions:    LOS: 10 days    Time spent:40 min    Lyna Laningham, Geraldo Docker, MD [facility] Pager [phone]  If 7PM-7AM, please contact night-coverage [email or web address] Password [facility] 10/27/2019, 12:45 PM

## 2019-10-28 DIAGNOSIS — N179 Acute kidney failure, unspecified: Secondary | ICD-10-CM | POA: Diagnosis not present

## 2019-10-28 DIAGNOSIS — D638 Anemia in other chronic diseases classified elsewhere: Secondary | ICD-10-CM

## 2019-10-28 DIAGNOSIS — N184 Chronic kidney disease, stage 4 (severe): Secondary | ICD-10-CM | POA: Diagnosis not present

## 2019-10-28 DIAGNOSIS — E138 Other specified diabetes mellitus with unspecified complications: Secondary | ICD-10-CM | POA: Diagnosis not present

## 2019-10-28 LAB — MAGNESIUM: Magnesium: 2.5 mg/dL — ABNORMAL HIGH (ref 1.7–2.4)

## 2019-10-28 LAB — CBC WITH DIFFERENTIAL/PLATELET
Abs Immature Granulocytes: 0.02 10*3/uL (ref 0.00–0.07)
Basophils Absolute: 0 10*3/uL (ref 0.0–0.1)
Basophils Relative: 0 %
Eosinophils Absolute: 0.2 10*3/uL (ref 0.0–0.5)
Eosinophils Relative: 3 %
HCT: 34.5 % — ABNORMAL LOW (ref 39.0–52.0)
Hemoglobin: 10.5 g/dL — ABNORMAL LOW (ref 13.0–17.0)
Immature Granulocytes: 0 %
Lymphocytes Relative: 33 %
Lymphs Abs: 2.4 10*3/uL (ref 0.7–4.0)
MCH: 26.1 pg (ref 26.0–34.0)
MCHC: 30.4 g/dL (ref 30.0–36.0)
MCV: 85.6 fL (ref 80.0–100.0)
Monocytes Absolute: 0.7 10*3/uL (ref 0.1–1.0)
Monocytes Relative: 10 %
Neutro Abs: 3.9 10*3/uL (ref 1.7–7.7)
Neutrophils Relative %: 54 %
Platelets: 404 10*3/uL — ABNORMAL HIGH (ref 150–400)
RBC: 4.03 MIL/uL — ABNORMAL LOW (ref 4.22–5.81)
RDW: 14.9 % (ref 11.5–15.5)
WBC: 7.2 10*3/uL (ref 4.0–10.5)
nRBC: 0 % (ref 0.0–0.2)

## 2019-10-28 LAB — COMPREHENSIVE METABOLIC PANEL
ALT: 16 U/L (ref 0–44)
AST: 36 U/L (ref 15–41)
Albumin: 2.6 g/dL — ABNORMAL LOW (ref 3.5–5.0)
Alkaline Phosphatase: 106 U/L (ref 38–126)
Anion gap: 14 (ref 5–15)
BUN: 141 mg/dL — ABNORMAL HIGH (ref 6–20)
CO2: 28 mmol/L (ref 22–32)
Calcium: 10 mg/dL (ref 8.9–10.3)
Chloride: 95 mmol/L — ABNORMAL LOW (ref 98–111)
Creatinine, Ser: 5.14 mg/dL — ABNORMAL HIGH (ref 0.61–1.24)
GFR calc Af Amer: 13 mL/min — ABNORMAL LOW (ref >60)
GFR calc non Af Amer: 11 mL/min — ABNORMAL LOW (ref >60)
Glucose, Bld: 155 mg/dL — ABNORMAL HIGH (ref 70–99)
Potassium: 5.9 mmol/L — ABNORMAL HIGH (ref 3.5–5.1)
Sodium: 137 mmol/L (ref 135–145)
Total Bilirubin: 0.6 mg/dL (ref 0.3–1.2)
Total Protein: 8.1 g/dL (ref 6.5–8.1)

## 2019-10-28 LAB — GLUCOSE, CAPILLARY
Glucose-Capillary: 115 mg/dL — ABNORMAL HIGH (ref 70–99)
Glucose-Capillary: 131 mg/dL — ABNORMAL HIGH (ref 70–99)
Glucose-Capillary: 177 mg/dL — ABNORMAL HIGH (ref 70–99)
Glucose-Capillary: 207 mg/dL — ABNORMAL HIGH (ref 70–99)
Glucose-Capillary: 222 mg/dL — ABNORMAL HIGH (ref 70–99)

## 2019-10-28 LAB — PHOSPHORUS: Phosphorus: 5.8 mg/dL — ABNORMAL HIGH (ref 2.5–4.6)

## 2019-10-28 LAB — POTASSIUM: Potassium: 5.5 mmol/L — ABNORMAL HIGH (ref 3.5–5.1)

## 2019-10-28 MED ORDER — SODIUM POLYSTYRENE SULFONATE 15 GM/60ML PO SUSP
45.0000 g | Freq: Once | ORAL | Status: AC
  Administered 2019-10-28: 45 g via ORAL
  Filled 2019-10-28: qty 180

## 2019-10-28 MED ORDER — SODIUM POLYSTYRENE SULFONATE 15 GM/60ML PO SUSP
60.0000 g | Freq: Once | ORAL | Status: AC
  Administered 2019-10-28: 60 g via ORAL
  Filled 2019-10-28: qty 240

## 2019-10-28 MED ORDER — SODIUM BICARBONATE 8.4 % IV SOLN
50.0000 meq | Freq: Once | INTRAVENOUS | Status: AC
  Administered 2019-10-28: 50 meq via INTRAVENOUS
  Filled 2019-10-28: qty 50

## 2019-10-28 MED ORDER — SODIUM CHLORIDE 0.45 % IV SOLN: INTRAVENOUS | Status: AC

## 2019-10-28 MED ORDER — SODIUM CHLORIDE 0.45 % IV BOLUS
250.0000 mL | Freq: Once | INTRAVENOUS | Status: AC
  Administered 2019-10-28: 250 mL via INTRAVENOUS

## 2019-10-28 NOTE — Care Management (Signed)
Per MD note yesterday , patient do not have capacity to determine disposition. Spoke with [facility] Director Nathaniel Man , advised to discuss with patient's current attending. If patient does not have capacity then will need guardian.   Secure chatted Attending. Will discuss after rounds.   [name] RN

## 2019-10-28 NOTE — Progress Notes (Signed)
Physical Therapy Treatment Patient Details Name: Zachary Young MRN: [id number] DOB: [birth year] Today's Date: 10/28/2019    History of Present Illness Pt is a 59 y/o male s/p R transtibial amputation. PMH including but not limited to CHF, DM, HTN.    PT Comments    Pt remains limited overall secondary to general weakness, resistance to any education on safety or proper technique with mobility and generally very easily agitated. Pt would continue to benefit from skilled physical therapy services at this time while admitted and after d/c to address the below listed limitations in order to improve overall safety and independence with functional mobility.   Follow Up Recommendations  CIR (per chart review was denied for [facility]; pt is planning to go home with ?family)     Financial risk analyst (measurements PT);Wheelchair cushion (measurements PT);Other (comment) (with amputee attachment on R)    Recommendations for Other Services       Precautions / Restrictions Precautions Precautions: Fall Precaution Comments: R limb protector Required Braces or Orthoses: Other Brace Other Brace: R limb guard Restrictions Weight Bearing Restrictions: Yes RLE Weight Bearing: Non weight bearing    Mobility  Bed Mobility Overal bed mobility: Modified Independent             General bed mobility comments: increased time and effort with HOB elevated  Transfers Overall transfer level: Needs assistance Equipment used: Rolling walker (2 wheeled) Transfers: Sit to/from [facility] Sit to Stand: +2 physical assistance;Min assist Stand pivot transfers: Min assist;+2 safety/equipment       General transfer comment: pt cued to push up from EOB but declined. MINA +2 to power up from very elevated EOB per pts request. MIN A +2 for pt to pivot to recliner to pts L side needing cues for safety related to RW mgmt and reaching back while descending onto  surfaces  Ambulation/Gait                 Stairs             Wheelchair Mobility    Modified Rankin (Stroke Patients Only)       Balance Overall balance assessment: Needs assistance Sitting-balance support: No upper extremity supported;Feet supported Sitting balance-Leahy Scale: Good     Standing balance support: Bilateral upper extremity supported Standing balance-Leahy Scale: Poor Standing balance comment: reliant on bilateral UEs on RW                            Cognition Arousal/Alertness: Awake/alert Behavior During Therapy: Flat affect;Agitated Overall Cognitive Status: Impaired/Different from baseline Area of Impairment: Safety/judgement;Awareness                         Safety/Judgement: Decreased awareness of deficits;Decreased awareness of safety Awareness: Intellectual   General Comments: pt continues to be somewhat resistive to instruction and easily angered      Exercises      General Comments        Pertinent Vitals/Pain Pain Assessment: Faces Faces Pain Scale: No hurt    Home Living                      Prior Function            PT Goals (current goals can now be found in the care plan section) Acute Rehab PT Goals Patient Stated Goal: to go home PT Goal Formulation:  With patient Time For Goal Achievement: 11/05/19 Potential to Achieve Goals: Good Progress towards PT goals: Progressing toward goals    Frequency    Min 4X/week      PT Plan Current plan remains appropriate    Co-evaluation PT/OT/SLP Co-Evaluation/Treatment: Yes Reason for Co-Treatment: Necessary to address cognition/behavior during functional activity;For patient/therapist safety;To address functional/ADL transfers PT goals addressed during session: Mobility/safety with mobility;Balance;Proper use of DME;Strengthening/ROM OT goals addressed during session: ADL's and self-care      AM-PAC PT "6 Clicks" Mobility    Outcome Measure  Help needed turning from your back to your side while in a flat bed without using bedrails?: None Help needed moving from lying on your back to sitting on the side of a flat bed without using bedrails?: None Help needed moving to and from a bed to a chair (including a wheelchair)?: A Lot Help needed standing up from a chair using your arms (e.g., wheelchair or bedside chair)?: A Lot Help needed to walk in hospital room?: A Lot Help needed climbing 3-5 steps with a railing? : Total 6 Click Score: 15    End of Session Equipment Utilized During Treatment: Gait belt Activity Tolerance: Treatment limited secondary to agitation Patient left: in chair;with call bell/phone within reach Nurse Communication: Mobility status PT Visit Diagnosis: Other abnormalities of gait and mobility (R26.89);Pain Pain - Right/Left: Right Pain - part of body: Leg     Time: 1219-7588 PT Time Calculation (min) (ACUTE ONLY): 25 min  Charges:  $Therapeutic Activity: 8-22 mins                     Anastasio Champion, DPT  Acute Rehabilitation Services Pager [phone] Office [place] 10/28/2019, 9:45 AM

## 2019-10-28 NOTE — Progress Notes (Signed)
Palliative Medicine RN Note: Patient was discussed in team rounds on Monday 10/26/19, and note that barrier to discharge now is psychosocial/guardianship/placement. There is not a role for PMT involvement, so we will sign off.  If a new, specific concern arises, please call or re-consult [place].  Marjie Skiff Bretton Tandy, RN, BSN, [facility] Palliative Medicine Team 10/28/2019 10:34 AM Office [phone]

## 2019-10-28 NOTE — Progress Notes (Signed)
Patient informed me that brother, Herbie [place], will not be able to have him stay with him at discharge.

## 2019-10-28 NOTE — Progress Notes (Signed)
Occupational Therapy Treatment Patient Details Name: Zachary Young MRN: [id number] DOB: [birth year] Today's Date: 10/28/2019    History of present illness Pt is a 59 y/o male s/p R transtibial amputation. PMH including but not limited to CHF, DM, HTN.   OT comments  Pt seen in conjunction with PT as pt continues to be impulsive and resistant to participate with therapies. Pt continues to present with impaired balance, decreased activity tolerance and generalized weakness impacting pts ability to complete BADLs. Pt preferred to complete LB dressing from long sitting although provided education on dressing from EOB d/t limited ROM to reach LB from bed level, however pt resistant to all education. DC plan remains appropriate, will follow acutely per POC.   Follow Up Recommendations  SNF;CIR;Other (comment)    Equipment Recommendations  3 in 1 bedside commode;Other (comment) (drop arm)    Recommendations for Other Services      Precautions / Restrictions Precautions Precautions: Fall Required Braces or Orthoses: Other Brace Other Brace: R limb guard Restrictions Weight Bearing Restrictions: Yes RLE Weight Bearing: Non weight bearing       Mobility Bed Mobility Overal bed mobility: Modified Independent             General bed mobility comments: increased time and effort withO HOB elevated  Transfers Overall transfer level: Needs assistance Equipment used: Rolling walker (2 wheeled) Transfers: Sit to/from [facility] Sit to Stand: +2 physical assistance;Min assist Stand pivot transfers: Min assist;+2 safety/equipment       General transfer comment: pt cued to push up from EOB but declined. MINA +2 to power up from very elevated EOB per pts request. MIN A +2 for pt to pivot to recliner to pts L side needing cues for safety related to RW mgmt and reaching back while descending onto surfaces    Balance Overall balance assessment: Needs assistance  Sitting-balance support: No upper extremity supported;Feet supported Sitting balance-Leahy Scale: Good     Standing balance support: Bilateral upper extremity supported Standing balance-Leahy Scale: Poor Standing balance comment: reliant on bilateral UEs on RW                           ADL either performed or assessed with clinical judgement   ADL Overall ADL's : Needs assistance/impaired                     Lower Body Dressing: Maximal assistance;Bed level Lower Body Dressing Details (indicate cue type and reason): pt declined completing LB dressing from EOB, preferred to complete from long sitting in bed. pt not receptive to education related to compensatory methods. pt required MAX A to don pants starting with RLE first, decreasd reach to LB from long sitting Toilet Transfer: Minimal assistance;+2 for physical assistance;RW;Cueing for safety;Stand-pivot Toilet Transfer Details (indicate cue type and reason): simulated via functional mobility to recliner, MIN A +2 for stand pivot to recliner with RW       Tub/Shower Transfer Details (indicate cue type and reason): pt reports walkin shower with seat in it Functional mobility during ADLs: Minimal assistance;+2 for physical assistance;Rolling walker (stand pivot only) General ADL Comments: pt not receptive to education related to ADLs     Vision       Perception     Praxis      Cognition Arousal/Alertness: Awake/alert Behavior During Therapy: Agitated Overall Cognitive Status: Impaired/Different from baseline Area of Impairment: Safety/judgement;Awareness  Safety/Judgement: Decreased awareness of deficits;Decreased awareness of safety Awareness: Intellectual   General Comments: pt continues to be somewhat resistive to instruction and easily angered        Exercises     Shoulder Instructions       General Comments      Pertinent Vitals/ Pain       Pain  Assessment: Faces Faces Pain Scale: No hurt  Home Living                                          Prior Functioning/Environment              Frequency  Min 2X/week        Progress Toward Goals  OT Goals(current goals can now be found in the care plan section)  Progress towards OT goals: Progressing toward goals  Acute Rehab OT Goals Patient Stated Goal: to go home OT Goal Formulation: With patient Time For Goal Achievement: 11/05/19 Potential to Achieve Goals: Good  Plan Discharge plan remains appropriate;Frequency remains appropriate    Co-evaluation    PT/OT/SLP Co-Evaluation/Treatment: Yes Reason for Co-Treatment: For patient/therapist safety;To address functional/ADL transfers   OT goals addressed during session: ADL's and self-care      AM-PAC OT "6 Clicks" Daily Activity     Outcome Measure   Help from another person eating meals?: None Help from another person taking care of personal grooming?: A Little Help from another person toileting, which includes using toliet, bedpan, or urinal?: A Lot Help from another person bathing (including washing, rinsing, drying)?: A Lot Help from another person to put on and taking off regular upper body clothing?: A Little Help from another person to put on and taking off regular lower body clothing?: A Lot 6 Click Score: 16    End of Session Equipment Utilized During Treatment: Gait belt;Rolling walker  OT Visit Diagnosis: Other abnormalities of gait and mobility (R26.89);Unsteadiness on feet (R26.81);Muscle weakness (generalized) (M62.81);Other symptoms and signs involving cognitive function;Pain Pain - Right/Left: Right Pain - part of body: Leg   Activity Tolerance Patient tolerated treatment well;Other (comment)   Patient Left in chair;with call bell/phone within reach   Nurse Communication Mobility status        Time: 5183-4373 OT Time Calculation (min): 25 min  Charges: OT General  Charges $OT Visit: 1 Visit OT Treatments $Self Care/Home Management : 8-22 mins  Lanier Clam., COTA/L Acute Rehabilitation Services [phone]    Ihor Gully 10/28/2019, 8:48 AM

## 2019-10-28 NOTE — Progress Notes (Addendum)
[facility] PROGRESS NOTE   Zachary Young QQI:[id number] DOB: 1960-06-12 DOA: 10/17/2019  PCP: System, Pcp Not In  Brief History/Interval Summary: [name] PMHx CKD stage V, DM type II controlled with complication, Diabetic Neuropathy, Chronic systolic and diastolic CHF, HTN.  Presented after passing out. Zachary Young notes that he went out on to his front porch to get some fresh air. Once out there, he became dizzy and had to lean against the house, and then he passed out. He slid down the side of the house and did not hit his head. He noted no chest pain, no fever, no chills, no change in urination. He does note that he has had a decreased appetite for the last 3-4 days, eating only about half of what he normally does. He has passed out before in the past when his sugar drops, and he thinks this may have happened today. He denies chest pain. He has SOB with ambulating at times, but nothing today. He report no change in urination and feels like he is urinating the normal amount. He reports taking torsemide 80mg  in the morning and evening (4 tablets) and not missing doses. He was having issues with pain in bed, and as I looked at his legs, I noted that his right leg was swollen, warm and mildly erythematous compared to the left. When asked, he noted a new wound on the plantar surface, at the 4th and 5th digit which he noticed first about 2 weeks ago.   Reason for Visit: Acute kidney injury.  Hyperkalemia  Consultants: None  Procedures: None  Antibiotics: Anti-infectives (From admission, onward)   Start     Dose/Rate Route Frequency Ordered Stop   10/21/19 0600  ceFAZolin (ANCEF) 3 g in dextrose 5 % 50 mL IVPB        3 g 100 mL/hr over 30 Minutes Intravenous On call to O.R. 10/20/19 1810 10/21/19 1013   10/20/19 1000  vancomycin (VANCOREADY) IVPB 1250 mg/250 mL        1,250 mg 166.7 mL/hr over 90 Minutes Intravenous  Once 10/20/19 0930 10/20/19 1143   10/17/19 2115   cefTRIAXone (ROCEPHIN) 2 g in sodium chloride 0.9 % 100 mL IVPB  Status:  Discontinued        2 g 200 mL/hr over 30 Minutes Intravenous Every 24 hours 10/17/19 2112 10/22/19 1603   10/17/19 2115  metroNIDAZOLE (FLAGYL) IVPB 500 mg  Status:  Discontinued        500 mg 100 mL/hr over 60 Minutes Intravenous Every 8 hours 10/17/19 2112 10/22/19 1603   10/17/19 2115  vancomycin (VANCOCIN) 2,250 mg in sodium chloride 0.9 % 500 mL IVPB        2,250 mg 250 mL/hr over 120 Minutes Intravenous  Once 10/17/19 2110 10/18/19 0209   10/17/19 2110  vancomycin variable dose per unstable renal function (pharmacist dosing)  Status:  Discontinued         Does not apply See admin instructions 10/17/19 2110 10/22/19 1603      Subjective/Interval History: Patient complains of nausea and vomiting.  Denies any abdominal pain.  Appears to be distracted.  ROS: Denies any chest pain or shortness of breath.    Assessment/Plan:  Right foot wound status post BKA on 7/7 This was done by Dr. Sharol Given.  Patient denies any pain currently.  Appears to have completed treatment with antibiotics.  Acute renal failure on chronic kidney disease stage IV, with hyperkalemia Apparently followed by nephrology in [place].  Seen by nephrology on 7/8 here in this hospital.  Not thought to be a good candidate for hemodialysis. To his baseline renal function.  Over the last couple of months his creatinine has fluctuated anywhere between 0.6.  Came in with creatinine of 5.74.  Looks like it had improved to 4.77 but then over the last 48 hours it has worsened again and noted to be 5.14 today.  Potassium is 5.9.  However his BUN has significantly increased to 141.  He may be experiencing some symptoms of uremia including nausea vomiting.   He was on diuretics which have been discontinued.  Patient may benefit from gentle IV hydration.  Than what it was a few months ago. It also looks like he has been getting Lokelma the last few days  without any improvement in his potassium levels.  We will give him a dose of Kayexalate. Recheck his levels later today.  If there is no improvement in his renal function will need to [facility] nephrology.  Diabetes mellitus type 2, uncontrolled with renal complications with chronic kidney disease CBGs are poorly controlled.  HbA1c 8.8.  He is on SSI Lantus.  May need to adjust the dose of his insulin.  Syncope Likely multifactorial including overdiuresis and acute kidney injury and acute infection.  No further episodes in the hospital.  Continue to monitor.  Chronic diastolic CHF EF 50 to 81% based on echocardiogram done in May.  He has been diuresed aggressively perhaps to the point of becoming hypovolemic as discussed above.  We will hold his diuretics and gently hydrate him.  His weight is significantly lower than what it was a few months ago.  Essential hypertension Monitor blood pressures closely.  Anemia of chronic kidney disease Hemoglobin is stable.  No evidence of overt blood loss  Hyperlipidemia LDL 50.  Tobacco use disorder Counseling to be provided  History of cocaine abuse His urine drug screen from 7/4 was positive for cocaine  Obesity Estimated body mass index is 36.46 kg/m as calculated from the following:   Height as of this encounter: 6\' 3"  (1.905 m).   Weight as of this encounter: 132.3 kg.  Behavioral issues Patient has exhibited belligerent behavior with staff.  He becomes verbally abusive.  Appears to have very poor understanding of his multiple medical problems.  He does not seem to understand the severity of his illness.  Does not seem to understand the risks of refusing treatment.  This was also seen by the psychiatrist when they evaluated him on 7/9.  At this time I do not feel that the patient has capacity to make medical decisions including disposition.  May need to pursue guardianship.  However at the same time uremia could be contributing to alteration  in his mentation.  May need to reassess his mentation once his renal function and BUN is better.  Goals of care Seen by palliative care previously.  Their note also suggested that the patient demonstrated no understanding of his numerous medical problems.  DVT Prophylaxis: On Lovenox Code Status: Full code Family Communication: Discussed with the patient but I did not feel that he understood what I was saying. Disposition Plan:  Status is: Inpatient  Remains inpatient appropriate because:Persistent severe electrolyte disturbances and IV treatments appropriate due to intensity of illness or inability to take PO   Dispo: The patient is from: Home              Anticipated d/c is to: To be determined  Anticipated d/c date is: 2 days              Patient currently is not medically stable to d/c.     Medications:  Scheduled: . amLODipine  10 mg Oral Daily  . aspirin EC  81 mg Oral Daily  . collagenase   Topical Daily  . docusate sodium  100 mg Oral BID  . enoxaparin (LOVENOX) injection  30 mg Subcutaneous Q24H  . gabapentin  300 mg Oral Daily   And  . gabapentin  600 mg Oral QHS  . insulin aspart  0-15 Units Subcutaneous Q4H  . insulin aspart  12 Units Subcutaneous TID WC  . insulin glargine  20 Units Subcutaneous QHS  . multivitamin with minerals  1 tablet Oral Daily  . nutrition supplement (JUVEN)  1 packet Oral BID BM  . Ensure Max Protein  11 oz Oral BID  . sodium chloride flush  3 mL Intravenous Q12H   Continuous:  YTK:ZSWFUXNAT, HYDROmorphone (DILAUDID) injection, hydrOXYzine, metoCLOPramide **OR** metoCLOPramide (REGLAN) injection, ondansetron **OR** ondansetron (ZOFRAN) IV, oxyCODONE-acetaminophen   Objective:  Vital Signs  Vitals:   10/27/19 1215 10/27/19 1812 10/28/19 0017 10/28/19 0424  BP: 128/70 (!) 150/88 110/63 140/63  Pulse: 80 78 74 78  Resp: 18 18 17 16   Temp: 98.4 F (36.9 C) 98.9 F (37.2 C) 98.4 F (36.9 C) 98.4 F (36.9 C)    TempSrc: Oral Oral Oral Oral  SpO2: 100% 100%    Weight:      Height:        Intake/Output Summary (Last 24 hours) at 10/28/2019 1135 Last data filed at 10/28/2019 1029 Gross per 24 hour  Intake 360 ml  Output 775 ml  Net -415 ml   Filed Weights   10/24/19 1822 10/25/19 0601 10/26/19 0500  Weight: 134.8 kg 132.3 kg 132.3 kg    General appearance: Awake alert.  In no distress.  Distracted.  Obese. Resp: Diminished air entry at the bases.  Normal effort at rest.  No wheezing or rhonchi.   Cardio: S1-S2 is normal regular.  No S3-S4.  No rubs murmurs or bruit GI: Abdomen is soft.  Nontender nondistended.  Bowel sounds are present normal.  No masses organomegaly Extremities: No edema.  Status post right BKA.  Status post left transmetatarsal amputation. Neurologic: Refuses to answer orientation questions.  Seems distracted.  No obvious focal deficits.   Lab Results:  Data Reviewed: I have personally reviewed following labs and imaging studies  CBC: Recent Labs  Lab 10/24/19 0446 10/25/19 0202 10/26/19 0347 10/27/19 0623 10/28/19 0439  WBC 7.9 6.9 7.2 7.9 7.2  NEUTROABS 4.0 3.8 3.9 4.5 3.9  HGB 9.0* 9.7* 10.4* 10.0* 10.5*  HCT 29.1* 31.4* 34.0* 33.1* 34.5*  MCV 85.3 84.9 84.6 84.7 85.6  PLT 444* 468* 497* 477* 404*    Basic Metabolic Panel: Recent Labs  Lab 10/23/19 0509 10/23/19 0509 10/24/19 0446 10/25/19 0202 10/26/19 0347 10/27/19 0623 10/28/19 0439  NA 136  --  139 139 136  --  137  K 4.6  --  5.1 5.1 5.6*  --  5.9*  CL 101  --  103 100 95*  --  95*  CO2 21*  --  24 27 30   --  28  GLUCOSE 148*  --  202* 167* 211*  --  155*  BUN 107*  --  121* 127* 131*  --  141*  CREATININE 5.20*  --  4.86* 4.77* 4.94*  --  5.14*  CALCIUM 9.4  --  9.8 10.4* 11.4*  --  10.0  MG 2.0   < > 2.1 2.3 2.5* 2.4 2.5*  PHOS 7.2*   < > 6.3* 7.1* 6.7* 6.6* 5.8*   < > = values in this interval not displayed.    GFR: Estimated Creatinine Clearance: 23.2 mL/min (A) (by C-G  formula based on SCr of 5.14 mg/dL (H)).  Liver Function Tests: Recent Labs  Lab 10/23/19 0509 10/24/19 0446 10/25/19 0202 10/26/19 0347 10/28/19 0439  AST 14* 12* 13* 13* 36  ALT 6 5 5 7 16   ALKPHOS 76 87 77 95 106  BILITOT 0.5 0.3 0.3 0.4 0.6  PROT 8.1 7.8 8.1 8.7* 8.1  ALBUMIN 2.6* 2.4* 2.5* 2.7* 2.6*    CBG: Recent Labs  Lab 10/27/19 1956 10/28/19 0011 10/28/19 0414 10/28/19 0613 10/28/19 1113  GLUCAP 215* 177* 131* 222* 207*     Radiology Studies: No results found.     LOS: 11 days   Amijah Timothy [employer] on [email or web address]  10/28/2019, 11:35 AM

## 2019-10-29 ENCOUNTER — Inpatient Hospital Stay (HOSPITAL_COMMUNITY): Payer: Medicaid Other

## 2019-10-29 DIAGNOSIS — N184 Chronic kidney disease, stage 4 (severe): Secondary | ICD-10-CM | POA: Diagnosis not present

## 2019-10-29 DIAGNOSIS — E138 Other specified diabetes mellitus with unspecified complications: Secondary | ICD-10-CM | POA: Diagnosis not present

## 2019-10-29 DIAGNOSIS — D638 Anemia in other chronic diseases classified elsewhere: Secondary | ICD-10-CM | POA: Diagnosis not present

## 2019-10-29 DIAGNOSIS — N179 Acute kidney failure, unspecified: Secondary | ICD-10-CM | POA: Diagnosis not present

## 2019-10-29 LAB — COMPREHENSIVE METABOLIC PANEL
ALT: 20 U/L (ref 0–44)
AST: 29 U/L (ref 15–41)
Albumin: 2.6 g/dL — ABNORMAL LOW (ref 3.5–5.0)
Alkaline Phosphatase: 103 U/L (ref 38–126)
Anion gap: 11 (ref 5–15)
BUN: 131 mg/dL — ABNORMAL HIGH (ref 6–20)
CO2: 32 mmol/L (ref 22–32)
Calcium: 9.6 mg/dL (ref 8.9–10.3)
Chloride: 93 mmol/L — ABNORMAL LOW (ref 98–111)
Creatinine, Ser: 5.77 mg/dL — ABNORMAL HIGH (ref 0.61–1.24)
GFR calc Af Amer: 12 mL/min — ABNORMAL LOW (ref >60)
GFR calc non Af Amer: 10 mL/min — ABNORMAL LOW (ref >60)
Glucose, Bld: 141 mg/dL — ABNORMAL HIGH (ref 70–99)
Potassium: 5.4 mmol/L — ABNORMAL HIGH (ref 3.5–5.1)
Sodium: 136 mmol/L (ref 135–145)
Total Bilirubin: 0.1 mg/dL — ABNORMAL LOW (ref 0.3–1.2)
Total Protein: 8.1 g/dL (ref 6.5–8.1)

## 2019-10-29 LAB — GLUCOSE, CAPILLARY
Glucose-Capillary: 186 mg/dL — ABNORMAL HIGH (ref 70–99)
Glucose-Capillary: 177 mg/dL — ABNORMAL HIGH (ref 70–99)
Glucose-Capillary: 124 mg/dL — ABNORMAL HIGH (ref 70–99)
Glucose-Capillary: 246 mg/dL — ABNORMAL HIGH (ref 70–99)
Glucose-Capillary: 200 mg/dL — ABNORMAL HIGH (ref 70–99)
Glucose-Capillary: 112 mg/dL — ABNORMAL HIGH (ref 70–99)

## 2019-10-29 LAB — CBC WITH DIFFERENTIAL/PLATELET
Abs Immature Granulocytes: 0.02 10*3/uL (ref 0.00–0.07)
Basophils Absolute: 0 10*3/uL (ref 0.0–0.1)
Basophils Relative: 0 %
Eosinophils Absolute: 0.2 10*3/uL (ref 0.0–0.5)
Eosinophils Relative: 3 %
HCT: 31.9 % — ABNORMAL LOW (ref 39.0–52.0)
Hemoglobin: 9.5 g/dL — ABNORMAL LOW (ref 13.0–17.0)
Immature Granulocytes: 0 %
Lymphocytes Relative: 31 %
Lymphs Abs: 2.2 10*3/uL (ref 0.7–4.0)
MCH: 25.5 pg — ABNORMAL LOW (ref 26.0–34.0)
MCHC: 29.8 g/dL — ABNORMAL LOW (ref 30.0–36.0)
MCV: 85.5 fL (ref 80.0–100.0)
Monocytes Absolute: 0.6 10*3/uL (ref 0.1–1.0)
Monocytes Relative: 9 %
Neutro Abs: 4 10*3/uL (ref 1.7–7.7)
Neutrophils Relative %: 57 %
Platelets: 411 10*3/uL — ABNORMAL HIGH (ref 150–400)
RBC: 3.73 MIL/uL — ABNORMAL LOW (ref 4.22–5.81)
RDW: 14.9 % (ref 11.5–15.5)
WBC: 7 10*3/uL (ref 4.0–10.5)
nRBC: 0 % (ref 0.0–0.2)

## 2019-10-29 LAB — PHOSPHORUS: Phosphorus: 5.9 mg/dL — ABNORMAL HIGH (ref 2.5–4.6)

## 2019-10-29 LAB — MAGNESIUM: Magnesium: 2.6 mg/dL — ABNORMAL HIGH (ref 1.7–2.4)

## 2019-10-29 MED ORDER — SODIUM POLYSTYRENE SULFONATE 15 GM/60ML PO SUSP
45.0000 g | Freq: Once | ORAL | Status: DC
  Filled 2019-10-29: qty 180

## 2019-10-29 NOTE — Progress Notes (Signed)
Nutrition Follow-up  DOCUMENTATION CODES:   Obesity unspecified  INTERVENTION:  Continue Ensure Max po BID, each supplement provides 150 kcal and 30 grams of protein.   Continue Juven BID, each packet provides 95 calories, 2.5 grams of protein (collagen) for wound healing.   NUTRITION DIAGNOSIS:   Increased nutrient needs related to wound healing as evidenced by estimated needs; ongoing  GOAL:   Patient will meet greater than or equal to 90% of their needs; met   MONITOR:   PO intake, Supplement acceptance, Skin, Labs, Weight trends, I & O's  REASON FOR ASSESSMENT:   Consult Wound healing  ASSESSMENT:   Pt presented with wound of R foot and hyperglycemia. PMH significant for kidney disease, T2DM, CHF, HTN, diabetic neuropathy Procedure (7/7): Right BKA.  Meal completion has been 100%. Pt currently has Ensure Max and Juven ordered and has been consuming them. RD to continue with current orders to aid in caloric and protein needs as well as in healing. Labs and medications reviewed.   Diet Order:   Diet Order            Diet renal/carb modified with fluid restriction Diet-HS Snack? Nothing; Fluid restriction: 1200 mL Fluid; Room service appropriate? Yes; Fluid consistency: Thin  Diet effective now                 EDUCATION NEEDS:   No education needs have been identified at this time  Skin:  Skin Assessment: Skin Integrity Issues: Skin Integrity Issues:: Wound VAC, Incisions Wound Vac: R leg Diabetic Ulcer: R foot Incisions: R leg Other: amputation - all toes  Last BM:  7/14  Height:   Ht Readings from Last 1 Encounters:  10/21/19 _0  (1.905 m)    Weight:   Wt Readings from Last 1 Encounters:  10/29/19 128.3 kg    Ideal Body Weight:     BMI:  Body mass index is 35.35 kg/m.  Estimated Nutritional Needs:   Kcal:  2400-2600  Protein:  130-140 grams  Fluid:  >2L/d   Corrin Parker, MS, RD, LDN RD pager number/after hours weekend pager  number on Amion.

## 2019-10-29 NOTE — Progress Notes (Signed)
[facility] PROGRESS NOTE   Zachary Young MHD:[id number] DOB: September 17, 1960 DOA: 10/17/2019  PCP: System, Pcp Not In  Brief History/Interval Summary: [name] PMHx CKD stage V, DM type II controlled with complication, Diabetic Neuropathy, Chronic systolic and diastolic CHF, HTN.  Presented after passing out. Mr. Schank notes that he went out on to his front porch to get some fresh air. Once out there, he became dizzy and had to lean against the house, and then he passed out. He slid down the side of the house and did not hit his head. He noted no chest pain, no fever, no chills, no change in urination. He does note that he has had a decreased appetite for the last 3-4 days, eating only about half of what he normally does. He has passed out before in the past when his sugar drops, and he thinks this may have happened today. He denies chest pain. He has SOB with ambulating at times, but nothing today. He report no change in urination and feels like he is urinating the normal amount. He reports taking torsemide 80mg  in the morning and evening (4 tablets) and not missing doses. He was having issues with pain in bed, and as I looked at his legs, I noted that his right leg was swollen, warm and mildly erythematous compared to the left. When asked, he noted a new wound on the plantar surface, at the 4th and 5th digit which he noticed first about 2 weeks ago.   Reason for Visit: Acute kidney injury.  Hyperkalemia  Consultants: Nephrology signed off 7/8  Procedures: None  Antibiotics: Anti-infectives (From admission, onward)   Start     Dose/Rate Route Frequency Ordered Stop   10/21/19 0600  ceFAZolin (ANCEF) 3 g in dextrose 5 % 50 mL IVPB        3 g 100 mL/hr over 30 Minutes Intravenous On call to O.R. 10/20/19 1810 10/21/19 1013   10/20/19 1000  vancomycin (VANCOREADY) IVPB 1250 mg/250 mL        1,250 mg 166.7 mL/hr over 90 Minutes Intravenous  Once 10/20/19 0930 10/20/19 1143    10/17/19 2115  cefTRIAXone (ROCEPHIN) 2 g in sodium chloride 0.9 % 100 mL IVPB  Status:  Discontinued        2 g 200 mL/hr over 30 Minutes Intravenous Every 24 hours 10/17/19 2112 10/22/19 1603   10/17/19 2115  metroNIDAZOLE (FLAGYL) IVPB 500 mg  Status:  Discontinued        500 mg 100 mL/hr over 60 Minutes Intravenous Every 8 hours 10/17/19 2112 10/22/19 1603   10/17/19 2115  vancomycin (VANCOCIN) 2,250 mg in sodium chloride 0.9 % 500 mL IVPB        2,250 mg 250 mL/hr over 120 Minutes Intravenous  Once 10/17/19 2110 10/18/19 0209   10/17/19 2110  vancomycin variable dose per unstable renal function (pharmacist dosing)  Status:  Discontinued         Does not apply See admin instructions 10/17/19 2110 10/22/19 1603      Subjective/Interval History: Patient poor historian.  Apparently refused to have IV fluids last night.  States that his nausea vomiting has improved though it still persists.  He also reports some abdominal discomfort today.  Difficult to get specific information from having due to his underlying behavioral issues.     Assessment/Plan:  Right foot wound status post BKA on 7/7 This was done by Dr. Sharol Given.  Patient denies any pain currently.  Appears to  have completed treatment with antibiotics.  Acute renal failure on chronic kidney disease stage IV, with hyperkalemia Apparently followed by nephrology in [place].  Seen by nephrology on 7/8 here in this hospital.  Not thought to be a good candidate for hemodialysis. Over the last couple of months his creatinine has fluctuated anywhere between 3.9-4.7.  Came in with creatinine of 5.74.   It appears that it had improved to 4.7 but over the last 3 days it has worsened.  Patient was getting diuretics and increase in BUN was noted.  His diuretics were held.  He was thought to be developing uremic symptoms so he was started on gentle IV hydration as he was thought to have diuresed too aggressively.  In the last 2 - 3 months he has  lost about 40 pounds or so.  BUN is slightly better today.  We will continue with IV fluids for another 24 hours.  Recheck his labs tomorrow. Patient was seen by nephrology on July 8.  We may have to reconsult if renal function does not improve.  Since he does have good urine output we can continue current management for now.  Abdominal ultrasound done in May could not visualize the left kidney.  No specific abnormality noted in the right kidney.  We will do a renal ultrasound. He was given [facility] with no improvement in his potassium levels.  Was given Kayexalate with improvement.  Another dose to be given today.  Nausea and vomiting Abdominal film unremarkable.  Symptoms most likely due to uremia.  Diabetes mellitus type 2, uncontrolled with renal complications with chronic kidney disease CBGs are poorly controlled.  HbA1c 8.8.  He is on SSI Lantus.  May need to adjust the dose of his insulin.  Syncope Likely multifactorial including overdiuresis and acute kidney injury and acute infection.  No further episodes in the hospital.  Continue to monitor.  Chronic diastolic CHF EF 50 to 84% based on echocardiogram done in May.  He has been diuresed aggressively perhaps to the point of becoming hypovolemic as discussed above.   Diuretics are now on hold.  He has lost about 40 pounds or so in the last 2 months.    Essential hypertension Monitor blood pressures closely.  Anemia of chronic kidney disease Hemoglobin is stable.  No evidence of overt blood loss  Hyperlipidemia LDL 50.  Tobacco use disorder Counseling to be provided  History of cocaine abuse His urine drug screen from 7/4 was positive for cocaine  Obesity Estimated body mass index is 35.35 kg/m as calculated from the following:   Height as of this encounter: 6\' 3"  (1.905 m).   Weight as of this encounter: 128.3 kg.  Behavioral issues Patient has exhibited belligerent behavior with staff.  He becomes verbally abusive.   Appears to have very poor understanding of his multiple medical problems.  He does not seem to understand the severity of his illness.  Does not seem to understand the risks of refusing treatment.  This was also seen by the psychiatrist when they evaluated him on 7/9.  At this time I do not feel that the patient has capacity to make medical decisions including disposition.  May need to pursue guardianship.  However at the same time uremia could be contributing to alteration in his mentation.  May need to reassess his mentation once his renal function and BUN is better.  Goals of care Seen by palliative care previously.  Their note also suggested that the patient demonstrated no  understanding of his numerous medical problems.  DVT Prophylaxis: On Lovenox Code Status: Full code Family Communication: Discussed with patient. Disposition Plan:  Status is: Inpatient  Remains inpatient appropriate because:Ongoing diagnostic testing needed not appropriate for outpatient work up and IV treatments appropriate due to intensity of illness or inability to take PO   Dispo:  Patient From: Home  Planned Disposition: To be determined  Expected discharge date: 11/02/19  Medically stable for discharge: No      Medications:  Scheduled: . amLODipine  10 mg Oral Daily  . aspirin EC  81 mg Oral Daily  . collagenase   Topical Daily  . docusate sodium  100 mg Oral BID  . enoxaparin (LOVENOX) injection  30 mg Subcutaneous Q24H  . gabapentin  300 mg Oral Daily   And  . gabapentin  600 mg Oral QHS  . insulin aspart  0-15 Units Subcutaneous Q4H  . insulin aspart  12 Units Subcutaneous TID WC  . insulin glargine  20 Units Subcutaneous QHS  . multivitamin with minerals  1 tablet Oral Daily  . nutrition supplement (JUVEN)  1 packet Oral BID BM  . Ensure Max Protein  11 oz Oral BID  . sodium chloride flush  3 mL Intravenous Q12H  . sodium polystyrene  45 g Oral Once   Continuous: . sodium chloride 75 mL/hr  at 10/28/19 1412   YTK:PTWSFKCLE, HYDROmorphone (DILAUDID) injection, hydrOXYzine, metoCLOPramide **OR** metoCLOPramide (REGLAN) injection, ondansetron **OR** ondansetron (ZOFRAN) IV, oxyCODONE-acetaminophen   Objective:  Vital Signs  Vitals:   10/28/19 0424 10/28/19 1210 10/28/19 1812 10/29/19 0550  BP: 140/63 116/83 133/70 136/72  Pulse: 78 83 81 80  Resp: 16 16 16    Temp: 98.4 F (36.9 C) (!) 97.4 F (36.3 C) (!) 97.5 F (36.4 C) 97.8 F (36.6 C)  TempSrc: Oral Oral Oral Oral  SpO2:  98% 99% 97%  Weight:    128.3 kg  Height:        Intake/Output Summary (Last 24 hours) at 10/29/2019 1011 Last data filed at 10/29/2019 0600 Gross per 24 hour  Intake 1629.08 ml  Output 1650 ml  Net -20.92 ml   Filed Weights   10/25/19 0601 10/26/19 0500 10/29/19 0550  Weight: 132.3 kg 132.3 kg 128.3 kg    General appearance: Awake alert.  In no distress.  Obese.  Distracted Resp: Diminished air entry at the bases but no definite wheezing rales or rhonchi. Cardio: S1-S2 is normal regular.  No S3-S4.  No rubs murmurs or bruit GI: Abdomen is soft.  Mildly tender in the lower abdomen without any rebound rigidity or guarding.  No masses organomegaly.  Extremities: Status post right BKA. Neurologic:   No focal neurological deficits.    Lab Results:  Data Reviewed: I have personally reviewed following labs and imaging studies  CBC: Recent Labs  Lab 10/25/19 0202 10/26/19 0347 10/27/19 0623 10/28/19 0439 10/29/19 0531  WBC 6.9 7.2 7.9 7.2 7.0  NEUTROABS 3.8 3.9 4.5 3.9 4.0  HGB 9.7* 10.4* 10.0* 10.5* 9.5*  HCT 31.4* 34.0* 33.1* 34.5* 31.9*  MCV 84.9 84.6 84.7 85.6 85.5  PLT 468* 497* 477* 404* 411*    Basic Metabolic Panel: Recent Labs  Lab 10/24/19 0446 10/24/19 0446 10/25/19 0202 10/26/19 0347 10/27/19 0623 10/28/19 0439 10/28/19 1340 10/29/19 0531  NA 139  --  139 136  --  137  --  136  K 5.1   < > 5.1 5.6*  --  5.9* 5.5* 5.4*  CL  103  --  100 95*  --  95*  --   93*  CO2 24  --  27 30  --  28  --  32  GLUCOSE 202*  --  167* 211*  --  155*  --  141*  BUN 121*  --  127* 131*  --  141*  --  131*  CREATININE 4.86*  --  4.77* 4.94*  --  5.14*  --  5.77*  CALCIUM 9.8  --  10.4* 11.4*  --  10.0  --  9.6  MG 2.1   < > 2.3 2.5* 2.4 2.5*  --  2.6*  PHOS 6.3*   < > 7.1* 6.7* 6.6* 5.8*  --  5.9*   < > = values in this interval not displayed.    GFR: Estimated Creatinine Clearance: 20.4 mL/min (A) (by C-G formula based on SCr of 5.77 mg/dL (H)).  Liver Function Tests: Recent Labs  Lab 10/24/19 0446 10/25/19 0202 10/26/19 0347 10/28/19 0439 10/29/19 0531  AST 12* 13* 13* 36 29  ALT 5 5 7 16 20   ALKPHOS 87 77 95 106 103  BILITOT 0.3 0.3 0.4 0.6 0.1*  PROT 7.8 8.1 8.7* 8.1 8.1  ALBUMIN 2.4* 2.5* 2.7* 2.6* 2.6*    CBG: Recent Labs  Lab 10/28/19 1113 10/28/19 1608 10/28/19 2205 10/29/19 0154 10/29/19 0541  GLUCAP 207* 115* 186* 177* 124*     Radiology Studies: DG Abd 2 Views  Result Date: 10/29/2019 CLINICAL DATA:  Abdominal distension, nausea, vomiting. EXAM: ABDOMEN - 2 VIEW COMPARISON:  None. FINDINGS: The bowel gas pattern is normal. There is no evidence of free air. No radio-opaque calculi or other significant radiographic abnormality is seen. IMPRESSION: Negative. Electronically Signed   By: Marijo Conception M.D.   On: 10/29/2019 08:47       LOS: 12 days   [facility] Pager on [email or web address]  10/29/2019, 10:11 AM

## 2019-10-29 NOTE — Plan of Care (Signed)
  Problem: Safety: Goal: Ability to remain free from injury will improve Outcome: Progressing   Problem: Pain Managment: Goal: General experience of comfort will improve Outcome: Progressing   

## 2019-10-30 DIAGNOSIS — F1410 Cocaine abuse, uncomplicated: Secondary | ICD-10-CM | POA: Diagnosis not present

## 2019-10-30 DIAGNOSIS — E785 Hyperlipidemia, unspecified: Secondary | ICD-10-CM

## 2019-10-30 DIAGNOSIS — R112 Nausea with vomiting, unspecified: Secondary | ICD-10-CM

## 2019-10-30 DIAGNOSIS — E138 Other specified diabetes mellitus with unspecified complications: Secondary | ICD-10-CM | POA: Diagnosis not present

## 2019-10-30 DIAGNOSIS — D638 Anemia in other chronic diseases classified elsewhere: Secondary | ICD-10-CM | POA: Diagnosis not present

## 2019-10-30 DIAGNOSIS — E875 Hyperkalemia: Secondary | ICD-10-CM

## 2019-10-30 DIAGNOSIS — I130 Hypertensive heart and chronic kidney disease with heart failure and stage 1 through stage 4 chronic kidney disease, or unspecified chronic kidney disease: Secondary | ICD-10-CM

## 2019-10-30 DIAGNOSIS — D631 Anemia in chronic kidney disease: Secondary | ICD-10-CM

## 2019-10-30 DIAGNOSIS — E669 Obesity, unspecified: Secondary | ICD-10-CM

## 2019-10-30 DIAGNOSIS — E1122 Type 2 diabetes mellitus with diabetic chronic kidney disease: Secondary | ICD-10-CM

## 2019-10-30 DIAGNOSIS — E7800 Pure hypercholesterolemia, unspecified: Secondary | ICD-10-CM | POA: Diagnosis not present

## 2019-10-30 LAB — RENAL FUNCTION PANEL
Albumin: 2.5 g/dL — ABNORMAL LOW (ref 3.5–5.0)
Anion gap: 11 (ref 5–15)
BUN: 124 mg/dL — ABNORMAL HIGH (ref 6–20)
CO2: 29 mmol/L (ref 22–32)
Calcium: 9.9 mg/dL (ref 8.9–10.3)
Chloride: 97 mmol/L — ABNORMAL LOW (ref 98–111)
Creatinine, Ser: 5.58 mg/dL — ABNORMAL HIGH (ref 0.61–1.24)
GFR calc Af Amer: 12 mL/min — ABNORMAL LOW
GFR calc non Af Amer: 10 mL/min — ABNORMAL LOW
Glucose, Bld: 133 mg/dL — ABNORMAL HIGH (ref 70–99)
Phosphorus: 5.6 mg/dL — ABNORMAL HIGH (ref 2.5–4.6)
Potassium: 5 mmol/L (ref 3.5–5.1)
Sodium: 137 mmol/L (ref 135–145)

## 2019-10-30 LAB — GLUCOSE, CAPILLARY
Glucose-Capillary: 135 mg/dL — ABNORMAL HIGH (ref 70–99)
Glucose-Capillary: 137 mg/dL — ABNORMAL HIGH (ref 70–99)
Glucose-Capillary: 144 mg/dL — ABNORMAL HIGH (ref 70–99)
Glucose-Capillary: 163 mg/dL — ABNORMAL HIGH (ref 70–99)
Glucose-Capillary: 180 mg/dL — ABNORMAL HIGH (ref 70–99)
Glucose-Capillary: 187 mg/dL — ABNORMAL HIGH (ref 70–99)
Glucose-Capillary: 55 mg/dL — ABNORMAL LOW (ref 70–99)

## 2019-10-30 LAB — CBC
HCT: 31.5 % — ABNORMAL LOW (ref 39.0–52.0)
Hemoglobin: 9.4 g/dL — ABNORMAL LOW (ref 13.0–17.0)
MCH: 25.5 pg — ABNORMAL LOW (ref 26.0–34.0)
MCHC: 29.8 g/dL — ABNORMAL LOW (ref 30.0–36.0)
MCV: 85.4 fL (ref 80.0–100.0)
Platelets: 406 10*3/uL — ABNORMAL HIGH (ref 150–400)
RBC: 3.69 MIL/uL — ABNORMAL LOW (ref 4.22–5.81)
RDW: 15 % (ref 11.5–15.5)
WBC: 6.7 10*3/uL (ref 4.0–10.5)
nRBC: 0 % (ref 0.0–0.2)

## 2019-10-30 MED ORDER — SODIUM POLYSTYRENE SULFONATE 15 GM/60ML PO SUSP
30.0000 g | Freq: Once | ORAL | Status: AC
  Administered 2019-10-30: 30 g via ORAL

## 2019-10-30 MED ORDER — INSULIN ASPART 100 UNIT/ML ~~LOC~~ SOLN
7.0000 [IU] | Freq: Three times a day (TID) | SUBCUTANEOUS | Status: DC
  Administered 2019-10-30 – 2019-11-07 (×19): 7 [IU] via SUBCUTANEOUS

## 2019-10-30 NOTE — Progress Notes (Signed)
PT Cancellation Note  Patient Details Name: Jhan Conery MRN: [id number] DOB: [birth year]   Cancelled Treatment:    Reason Eval/Treat Not Completed: Other (comment) attempted to work with patient however MD in room having extensive/involved conversation with patient, and he was unavailable for therapy. Will attempt to return if time/schedule allow.   Windell Norfolk, DPT, PN1   Supplemental Physical Therapist [facility]    Pager [phone] Acute Rehab Office [phone]

## 2019-10-30 NOTE — Progress Notes (Signed)
PROGRESS NOTE    Zachary Young  KTG:[id number] DOB: 02/02/1961 DOA: 10/17/2019 PCP: System, Pcp Not In    Brief Narrative:  [name] PMHxCKD stage V, DM type II controlled with complication, Diabetic Neuropathy, Chronic systolic and diastolic CHF, HTN.  Presentedafter passing out. Zachary Young notes that he went out on to his front porch to get some fresh air. Once out there, he became dizzy and had to lean against the house, and then he passed out. He slid down the side of the house and did not hit his head. He noted no chest pain, no fever, no chills, no change in urination. He does note that he has had a decreased appetite for the last 3-4 days, eating only about half of what he normally does. He has passed out before in the past when his sugar drops, and he thinks this may have happened today. He denies chest pain. He has SOB with ambulating at times, but nothing today. He report no change in urination and feels like he is urinating the normal amount. He reports taking torsemide 80mg  in the morning and evening (4 tablets) and not missing doses. He was having issues with pain in bed, and as I looked at his legs, I noted that his right leg was swollen, warm and mildly erythematous compared to the left. When asked, he noted a new wound on the plantar surface, at the 4th and 5th digit which he noticed first about 2 weeks ago.     Consultants:  Nephrology signed off 7/8  Procedures:   Antimicrobials:      Subjective: CBG 55 this am , give  OJ/Ice cram with repeat at 135. No complaints to me except he is upset about the food he is getting here.   Objective: Vitals:   10/29/19 1300 10/29/19 1810 10/30/19 0034 10/30/19 0451  BP: 123/72 121/72 124/66 129/68  Pulse: 81 84 81 81  Resp: 16 17 18 18   Temp: (!) 97.5 F (36.4 C) 98.1 F (36.7 C) 97.9 F (36.6 C) 98 F (36.7 C)  TempSrc: Oral Oral Oral Oral  SpO2: 98% 98% 98% 100%  Weight:    128.3 kg  Height:         Intake/Output Summary (Last 24 hours) at 10/30/2019 0825 Last data filed at 10/30/2019 4287 Gross per 24 hour  Intake 780 ml  Output 1800 ml  Net -1020 ml   Filed Weights   10/26/19 0500 10/29/19 0550 10/30/19 0451  Weight: 132.3 kg 128.3 kg 128.3 kg    Examination:  General exam: appears upset about the food he is getting here, but comfortable in bed Respiratory system: Clear to auscultation. Respiratory effort normal. Cardiovascular system: S1 & S2 heard, RRR. No JVD, murmurs, rubs, gallops or clicks.  Gastrointestinal system: Abdomen is nondistended, soft and nontender. Normal bowel sounds heard. Central nervous system: grossly intact Extremities: no edema., Rt BKA Skin: Warm dry Psychiatry: Difficult to assess as he is in foul mood due to not liking the food he is getting    Data Reviewed: I have personally reviewed following labs and imaging studies  CBC: Recent Labs  Lab 10/25/19 0202 10/25/19 0202 10/26/19 0347 10/27/19 0623 10/28/19 0439 10/29/19 0531 10/30/19 0702  WBC 6.9   < > 7.2 7.9 7.2 7.0 6.7  NEUTROABS 3.8  --  3.9 4.5 3.9 4.0  --   HGB 9.7*   < > 10.4* 10.0* 10.5* 9.5* 9.4*  HCT 31.4*   < > 34.0* 33.1*  34.5* 31.9* 31.5*  MCV 84.9   < > 84.6 84.7 85.6 85.5 85.4  PLT 468*   < > 497* 477* 404* 411* 406*   < > = values in this interval not displayed.   Basic Metabolic Panel: Recent Labs  Lab 10/25/19 0202 10/25/19 0202 10/26/19 0347 10/27/19 6734 10/28/19 0439 10/28/19 1340 10/29/19 0531 10/30/19 0702  NA 139  --  136  --  137  --  136 137  K 5.1   < > 5.6*  --  5.9* 5.5* 5.4* 5.0  CL 100  --  95*  --  95*  --  93* 97*  CO2 27  --  30  --  28  --  32 29  GLUCOSE 167*  --  211*  --  155*  --  141* 133*  BUN 127*  --  131*  --  141*  --  131* 124*  CREATININE 4.77*  --  4.94*  --  5.14*  --  5.77* 5.58*  CALCIUM 10.4*  --  11.4*  --  10.0  --  9.6 9.9  MG 2.3  --  2.5* 2.4 2.5*  --  2.6*  --   PHOS 7.1*   < > 6.7* 6.6* 5.8*  --  5.9*  5.6*   < > = values in this interval not displayed.   GFR: Estimated Creatinine Clearance: 21.1 mL/min (A) (by C-G formula based on SCr of 5.58 mg/dL (H)). Liver Function Tests: Recent Labs  Lab 10/24/19 0446 10/24/19 0446 10/25/19 0202 10/26/19 0347 10/28/19 0439 10/29/19 0531 10/30/19 0702  AST 12*  --  13* 13* 36 29  --   ALT 5  --  5 7 16 20   --   ALKPHOS 87  --  77 95 106 103  --   BILITOT 0.3  --  0.3 0.4 0.6 0.1*  --   PROT 7.8  --  8.1 8.7* 8.1 8.1  --   ALBUMIN 2.4*   < > 2.5* 2.7* 2.6* 2.6* 2.5*   < > = values in this interval not displayed.   No results for input(s): LIPASE, AMYLASE in the last 168 hours. No results for input(s): AMMONIA in the last 168 hours. Coagulation Profile: No results for input(s): INR, PROTIME in the last 168 hours. Cardiac Enzymes: No results for input(s): CKTOTAL, CKMB, CKMBINDEX, TROPONINI in the last 168 hours. BNP (last 3 results) No results for input(s): PROBNP in the last 8760 hours. HbA1C: No results for input(s): HGBA1C in the last 72 hours. CBG: Recent Labs  Lab 10/29/19 1601 10/29/19 2017 10/30/19 0041 10/30/19 0447 10/30/19 0731  GLUCAP 200* 112* 187* 137* 144*   Lipid Profile: No results for input(s): CHOL, HDL, LDLCALC, TRIG, CHOLHDL, LDLDIRECT in the last 72 hours. Thyroid Function Tests: No results for input(s): TSH, T4TOTAL, FREET4, T3FREE, THYROIDAB in the last 72 hours. Anemia Panel: No results for input(s): VITAMINB12, FOLATE, FERRITIN, TIBC, IRON, RETICCTPCT in the last 72 hours. Sepsis Labs: No results for input(s): PROCALCITON, LATICACIDVEN in the last 168 hours.  No results found for this or any previous visit (from the past 240 hour(s)).       Radiology Studies: US RENAL  Result Date: 10/29/2019 CLINICAL DATA:  Acute exacerbation of chronic renal disease EXAM: RENAL / URINARY TRACT ULTRASOUND COMPLETE COMPARISON:  None. FINDINGS: Right Kidney: Renal measurements: 11.1 x 5.4 x 5.6 cm = volume:  172.4 mL . Echogenicity is increased. Renal cortical thickness is low normal. No  mass, perinephric fluid, or hydronephrosis visualized. No sonographically demonstrable calculus or ureterectasis. Left Kidney: Renal measurements: 9.0 x 5.3 x 4.3 cm = volume: 107.4 mL. Echogenicity is increased. Renal cortical thickness within normal limits. No mass, perinephric fluid, or hydronephrosis visualized. No sonographically demonstrable calculus or ureterectasis. Bladder: Appears normal for degree of bladder distention. Other: None. IMPRESSION: Increased renal echogenicity, a finding indicative of medical renal disease. No obstructing focus in either kidney. Borderline size discrepancy between kidneys. Significance of this finding uncertain. This finding potentially could indicate a degree of renal artery stenosis on the left. In this regard, question whether patient is hypertensive. Study otherwise unremarkable. Note that there is overlying gas making evaluation of the left kidney somewhat less than optimal. Electronically Signed   By: Lowella Grip III M.D.   On: 10/29/2019 14:44   DG Abd 2 Views  Result Date: 10/29/2019 CLINICAL DATA:  Abdominal distension, nausea, vomiting. EXAM: ABDOMEN - 2 VIEW COMPARISON:  None. FINDINGS: The bowel gas pattern is normal. There is no evidence of free air. No radio-opaque calculi or other significant radiographic abnormality is seen. IMPRESSION: Negative. Electronically Signed   By: Marijo Conception M.D.   On: 10/29/2019 08:47        Scheduled Meds: . amLODipine  10 mg Oral Daily  . aspirin EC  81 mg Oral Daily  . collagenase   Topical Daily  . docusate sodium  100 mg Oral BID  . enoxaparin (LOVENOX) injection  30 mg Subcutaneous Q24H  . gabapentin  300 mg Oral Daily   And  . gabapentin  600 mg Oral QHS  . insulin aspart  0-15 Units Subcutaneous Q4H  . insulin aspart  12 Units Subcutaneous TID WC  . insulin glargine  20 Units Subcutaneous QHS  . multivitamin  with minerals  1 tablet Oral Daily  . nutrition supplement (JUVEN)  1 packet Oral BID BM  . Ensure Max Protein  11 oz Oral BID  . sodium chloride flush  3 mL Intravenous Q12H  . sodium polystyrene  45 g Oral Once   Continuous Infusions:  Assessment & Plan:   Active Problems:   Hypertensive heart disease with CHF (congestive heart failure) (HCC)   Chronic kidney disease, stage 4 (severe) (HCC)   HLD (hyperlipidemia)   HTN (hypertension)   Systolic and diastolic CHF, chronic (HCC)   Tobacco use disorder   Type 2 diabetes mellitus with circulatory disorder, with long-term current use of insulin (HCC)   Wound of right foot   Syncope and collapse   Cocaine abuse ([place])   Subacute osteomyelitis, right ankle and foot (HCC)   Abscess of right foot   Diabetic neuropathy (HCC)   Anemia of chronic disease   Severe obesity (BMI 35.0-39.9) with comorbidity ([place])   Metabolic acidosis   DM (diabetes mellitus), secondary, uncontrolled, with complications (HCC)   Elevated sedimentation rate   Elevated C-reactive protein (CRP)   Hypoalbuminemia   Goals of care, counseling/discussion   Palliative care by specialist   Right foot wound status post BKA on 7/7 This was done by Dr. Sharol Given.  Patient denies any pain currently.  Appears to have completed treatment with antibiotics.  Acute renal failure on chronic kidney disease stage IV, with hyperkalemia Apparently followed by nephrology in [place].  Seen by nephrology on 7/8 here in this hospital.  Not thought to be a good candidate for hemodialysis. Over the last couple of months his creatinine has fluctuated anywhere between 3.9-4.7.  Came in  with creatinine of 5.74.   It appears that it had improved to 4.7 but over the last 3 days it has worsened.  Patient was getting diuretics and increase in BUN was noted.  His diuretics were held.  He was thought to be developing uremic symptoms so he was started on gentle IV hydration as he was thought to  have diuresed too aggressively.  In the last 2 - 3 months he has lost about 40 pounds or so.  BUN is slightly better today.  We will continue with IV fluids for another 24 hours.  Recheck his labs tomorrow. Patient was seen by nephrology on July 8.  We may have to reconsult if renal function does not improve.  Since he does have good urine output we can continue current management for now.  Renal US: Medical renal disease.  No obstructing focus in either kidney.  There is question of degree of renal artery stenosis in the left question whether patient is hypertensive.  Will discuss with nephrology. Have reconsulted nephrology today, Dr. Joelyn Oms will see patient today  was given [facility] with no improvement in his potassium levels.  Was given Kayexalate  With improvement K is 5.0. will give another dose of kayexalate today as likely his K will go up again  Nausea and vomiting Abdominal film unremarkable. Improved , without any complaints today symptoms most likely due to uremia   Diabetes mellitus type 2, uncontrolled with renal complications with chronic kidney disease CBGs are poorly controlled.  HbA1c 8.8.  He is on SSI Lantus.  Today he was hypoglycemic improved with alternating ice cream. Decrease his NovoLog meals to 7 units 3 times daily   Syncope Likely multifactorial including overdiuresis and acute kidney injury and acute infection.  No further episodes in the hospital.   We will continue to monitor    Chronic diastolic CHF EF 50 to 09% based on echocardiogram done in May.  He has been diuresed aggressively perhaps to the point of becoming hypovolemic as discussed above.   Diuretics are now on hold.  He has lost about 40 pounds or so in the last 2 months.   Appears to be euvolemic continue to monitor   Essential hypertension Blood pressures remained stable.  We will continue to monitor  Anemia of chronic kidney disease Hemoglobin is stable.  No evidence of overt blood  loss  Hyperlipidemia LDL 50.  Tobacco use disorder Counseling to be provided  History of cocaine abuse His urine drug screen from 7/4 was positive for cocaine  Obesity Estimated body mass index is 35.35 kg/m as calculated from the following:   Height as of this encounter: 6\' 3"  (1.905 m).   Weight as of this encounter: 128.3 kg.  Behavioral issues Patient has exhibited belligerent behavior with staff.  He becomes verbally abusive.  Appears to have very poor understanding of his multiple medical problems.  He does not seem to understand the severity of his illness.  Does not seem to understand the risks of refusing treatment.  This was also seen by the psychiatrist when they evaluated him on 7/9.  At this time I do not feel that the patient has capacity to make medical decisions including disposition.  May need to pursue guardianship.  However at the same time uremia could be contributing to alteration in his mentation.  May need to reassess his mentation once his renal function and BUN is better.  Goals of care Seen by palliative care previously.  Their note  also suggested that the patient demonstrated no understanding of his numerous medical problems.  DVT Prophylaxis: On Lovenox Code Status: Full code Family Communication:  No family at bedside Disposition Plan:  Status is: Inpatient    Status is: Inpatient  Remains inpatient appropriate because:Inpatient level of care appropriate due to severity of illness   Dispo:  Patient From: Home  Planned Disposition: To be determined  Expected discharge date: 11/02/19  Medically stable for discharge: No             LOS: 13 days   Time spent: 40 min with >50% on coc    Nolberto Hanlon, MD [facility] Pager [phone]  If 7PM-7AM, please contact night-coverage [email or web address] Password [email or web address] 10/30/2019, 8:25 AM

## 2019-10-30 NOTE — Progress Notes (Signed)
Hypoglycemic Event  CBG: 55  Treatment: 8 oz juice/soda, ice cream  Symptoms: Hungry and Nervous/irritable  Follow-up CBG: Time:1147 CBG Result: 135  Possible Reasons for Event: Inadequate meal intake-reported that dietary gave him a small breakfast, but did receive his coverage because he ate it all.  Comments/MD notified: MD Tonette Bihari

## 2019-10-30 NOTE — Progress Notes (Signed)
Admit: 10/17/2019 LOS: 13   Subjective:   Called back to see the patient after Dr. Marval Regal saw patient on 7/8.  He has advancing CKD.  I was a difficult conversation and that note has been reviewed.  Since then his creatinine has further worsened, now 5.6, he has had recurrent hyperkalemia requiring Kayexalate.  Milly Jakob has been tried and thought not terribly useful.  His BUN is 124.  He has undergone right BKA  He is homeless, has strained relationships with family, ongoing substance use, and is on parole; housing is very difficult for him  He is emotional, agitated, profane, but largely expresses fear and denial of his current circumstances especially about dialysis.  He does not want to go on dialysis but does not wish to die either.  He feels that nothing is being done for him.  We reviewed all the types of care has been receiving.  I have let him know that he has progressive kidney disease and is reaching the point where dialysis would be indicated.  He is having some nausea and vomiting  7/15 renal ultrasound had no acute findings.  Medical renal disease findings were present.  There was a 2 cm difference in the size of the kidneys where radiology wondered if there could be some evidence of renal artery stenosis.  Blood pressures have been well controlled  Palliative care notes reviewed  07/15 0701 - 07/16 0700 In: 480 [P.O.:480] Out: 1800 [Urine:1800]  Filed Weights   10/26/19 0500 10/29/19 0550 10/30/19 0451  Weight: 132.3 kg 128.3 kg 128.3 kg    Scheduled Meds:  amLODipine  10 mg Oral Daily   aspirin EC  81 mg Oral Daily   collagenase   Topical Daily   docusate sodium  100 mg Oral BID   enoxaparin (LOVENOX) injection  30 mg Subcutaneous Q24H   gabapentin  300 mg Oral Daily   And   gabapentin  600 mg Oral QHS   insulin aspart  0-15 Units Subcutaneous Q4H   insulin aspart  7 Units Subcutaneous TID WC   insulin glargine  20 Units Subcutaneous QHS    multivitamin with minerals  1 tablet Oral Daily   nutrition supplement (JUVEN)  1 packet Oral BID BM   Ensure Max Protein  11 oz Oral BID   sodium chloride flush  3 mL Intravenous Q12H   sodium polystyrene  45 g Oral Once   Continuous Infusions: PRN Meds:.albuterol, HYDROmorphone (DILAUDID) injection, hydrOXYzine, metoCLOPramide **OR** metoCLOPramide (REGLAN) injection, ondansetron **OR** ondansetron (ZOFRAN) IV, oxyCODONE-acetaminophen  Current Labs: reviewed    Physical Exam:  Blood pressure 129/76, pulse 80, temperature (!) 97.4 F (36.3 C), temperature source Oral, resp. rate 18, height 6\' 3"  (1.905 m), weight 128.3 kg, SpO2 97 %. Agitated/animated, but eventually calms down Regular, normal S1 and S2, no rub Clear bilaterally, normal work of breathing Obese, soft, nontender, no abdominal bruits Trace edema on the left leg, right leg in a dressing Nonfocal  A 1. Progressive CKD5 with intermittent hyperkalemia; likely some uremia with nausea and vomiting; sig azotemia 2. S/p R BKA for diabetic foot ulcer with osteomyelitis 3. DM2 4. Cocaine use 5. Chronic diastolic and systolic heart failure 6. Chronic anemia, hemoglobin stable in 9s 7. Homelessness 8. Tobacco user  P  Again, explore dialysis.  He is not ready to start now.  I think, that starting now would be reasonable.  Recognizably, he would not do very well with it unless significant changes for the better were  made.  At this point will continue supportive care, will come back and see him tomorrow.  Continue the conversation that was started today.  Continue Kayexalate, perhaps daily dosing around 10 g, to see if we can keep his potassium stable.  Medication Issues; o Preferred narcotic agents for pain control are hydromorphone, fentanyl, and methadone. Morphine should not be used.  o Baclofen should be avoided o Avoid oral sodium phosphate and magnesium citrate based laxatives / bowel preps    Pearson Grippe  MD 10/30/2019, 3:36 PM  Recent Labs  Lab 10/28/19 0439 10/28/19 0439 10/28/19 1340 10/29/19 0531 10/30/19 0702  NA 137  --   --  136 137  K 5.9*   < > 5.5* 5.4* 5.0  CL 95*  --   --  93* 97*  CO2 28  --   --  32 29  GLUCOSE 155*  --   --  141* 133*  BUN 141*  --   --  131* 124*  CREATININE 5.14*  --   --  5.77* 5.58*  CALCIUM 10.0  --   --  9.6 9.9  PHOS 5.8*  --   --  5.9* 5.6*   < > = values in this interval not displayed.   Recent Labs  Lab 10/27/19 0623 10/27/19 0623 10/28/19 0439 10/29/19 0531 10/30/19 0702  WBC 7.9   < > 7.2 7.0 6.7  NEUTROABS 4.5  --  3.9 4.0  --   HGB 10.0*   < > 10.5* 9.5* 9.4*  HCT 33.1*   < > 34.5* 31.9* 31.5*  MCV 84.7   < > 85.6 85.5 85.4  PLT 477*   < > 404* 411* 406*   < > = values in this interval not displayed.

## 2019-10-31 DIAGNOSIS — D638 Anemia in other chronic diseases classified elsewhere: Secondary | ICD-10-CM | POA: Diagnosis not present

## 2019-10-31 DIAGNOSIS — N184 Chronic kidney disease, stage 4 (severe): Secondary | ICD-10-CM | POA: Diagnosis not present

## 2019-10-31 DIAGNOSIS — E138 Other specified diabetes mellitus with unspecified complications: Secondary | ICD-10-CM | POA: Diagnosis not present

## 2019-10-31 DIAGNOSIS — F1410 Cocaine abuse, uncomplicated: Secondary | ICD-10-CM | POA: Diagnosis not present

## 2019-10-31 LAB — GLUCOSE, CAPILLARY
Glucose-Capillary: 161 mg/dL — ABNORMAL HIGH (ref 70–99)
Glucose-Capillary: 171 mg/dL — ABNORMAL HIGH (ref 70–99)
Glucose-Capillary: 182 mg/dL — ABNORMAL HIGH (ref 70–99)
Glucose-Capillary: 188 mg/dL — ABNORMAL HIGH (ref 70–99)
Glucose-Capillary: 214 mg/dL — ABNORMAL HIGH (ref 70–99)
Glucose-Capillary: 218 mg/dL — ABNORMAL HIGH (ref 70–99)

## 2019-10-31 LAB — CBC
HCT: 31.5 % — ABNORMAL LOW (ref 39.0–52.0)
Hemoglobin: 9.3 g/dL — ABNORMAL LOW (ref 13.0–17.0)
MCH: 25.5 pg — ABNORMAL LOW (ref 26.0–34.0)
MCHC: 29.5 g/dL — ABNORMAL LOW (ref 30.0–36.0)
MCV: 86.3 fL (ref 80.0–100.0)
Platelets: 429 10*3/uL — ABNORMAL HIGH (ref 150–400)
RBC: 3.65 MIL/uL — ABNORMAL LOW (ref 4.22–5.81)
RDW: 15 % (ref 11.5–15.5)
WBC: 8.1 10*3/uL (ref 4.0–10.5)
nRBC: 0 % (ref 0.0–0.2)

## 2019-10-31 MED ORDER — SODIUM POLYSTYRENE SULFONATE 15 GM/60ML PO SUSP
10.0000 g | Freq: Every day | ORAL | Status: DC
  Administered 2019-10-31: 10 g via ORAL
  Filled 2019-10-31: qty 60

## 2019-10-31 NOTE — Progress Notes (Signed)
Physical Therapy Treatment Patient Details Name: Zachary Young MRN: [id number] DOB: August 04, 1960 Today's Date: 10/31/2019    History of Present Illness Pt is a 59 y/o male s/p R transtibial amputation. PMH including but not limited to CHF, DM, HTN.    PT Comments    Pt supine in bed on arrival.  He remains impulsive in movement and lacks insight or is in denial of his deficits.  He presents with multiple instances of impaired balance and required min assistance to correct or maintain.  Pt continues to benefit from post acute rehab but has been denied CIR placement.  Will inform supervising PT of need to update recommendations at this time.     Follow Up Recommendations  SNF     Equipment Recommendations  Wheelchair (measurements PT);Wheelchair cushion (measurements PT);Other (comment) (with amputee leg rest on R side.)    Recommendations for Other Services       Precautions / Restrictions Precautions Precautions: Fall Precaution Comments: R limb protector Required Braces or Orthoses: Other Brace Other Brace: R limb guard Restrictions Weight Bearing Restrictions: No RLE Weight Bearing: Non weight bearing (BKA) Other Position/Activity Restrictions: NWB R LE    Mobility  Bed Mobility Overal bed mobility: Modified Independent             General bed mobility comments: increased time and effort with HOB elevated  Transfers Overall transfer level: Needs assistance Equipment used: Rolling walker (2 wheeled) Transfers: Sit to/from Stand Sit to Stand: Min guard;Min assist         General transfer comment: Pt able to follow commands for hand placement but impulsive to rise into standing. He required min assistance initially in standing to gain balance.  Ambulation/Gait Ambulation/Gait assistance: Min assist Gait Distance (Feet): 12 Feet Assistive device: Rolling walker (2 wheeled) Gait Pattern/deviations: Step-to pattern;Antalgic;Trunk flexed (hop to pattern)      General Gait Details: Cues for upper trunk control and RW safety.  Pt required cues to keep device close for support.   Stairs             Wheelchair Mobility    Modified Rankin (Stroke Patients Only)       Balance Overall balance assessment: Needs assistance   Sitting balance-Leahy Scale: Good     Standing balance support: Bilateral upper extremity supported Standing balance-Leahy Scale: Poor                              Cognition Arousal/Alertness: Awake/alert Behavior During Therapy: Agitated (but easy to redirect) Overall Cognitive Status: Impaired/Different from baseline Area of Impairment: Safety/judgement;Awareness                       Following Commands: Follows multi-step commands with increased time Safety/Judgement: Decreased awareness of deficits;Decreased awareness of safety Awareness: Intellectual Problem Solving: Difficulty sequencing;Requires verbal cues General Comments: Pt more compliant this session and progressed to RLE exercise.  He gets agitated but was easy to redirect.      Exercises Amputee Exercises Hip Extension: AROM;Right;15 reps;Standing    General Comments        Pertinent Vitals/Pain Pain Assessment: Faces Faces Pain Scale: Hurts little more Pain Location: RLE Pain Descriptors / Indicators: Sore Pain Intervention(s): Monitored during session;Limited activity within patient's tolerance    Home Living                      Prior Function  PT Goals (current goals can now be found in the care plan section) Acute Rehab PT Goals Patient Stated Goal: to go home Potential to Achieve Goals: Fair Progress towards PT goals: Progressing toward goals    Frequency    Min 4X/week      PT Plan Discharge plan needs to be updated    Co-evaluation              AM-PAC PT "6 Clicks" Mobility   Outcome Measure  Help needed turning from your back to your side while in a flat  bed without using bedrails?: None Help needed moving from lying on your back to sitting on the side of a flat bed without using bedrails?: None Help needed moving to and from a bed to a chair (including a wheelchair)?: A Little Help needed standing up from a chair using your arms (e.g., wheelchair or bedside chair)?: A Little Help needed to walk in hospital room?: A Little Help needed climbing 3-5 steps with a railing? : A Lot 6 Click Score: 19    End of Session Equipment Utilized During Treatment: Gait belt Activity Tolerance: Treatment limited secondary to agitation Patient left: in chair;with call bell/phone within reach;with nursing/sitter in room Nurse Communication: Mobility status PT Visit Diagnosis: Other abnormalities of gait and mobility (R26.89);Pain Pain - Right/Left: Right Pain - part of body: Leg     Time: 6237-6283 PT Time Calculation (min) (ACUTE ONLY): 11 min  Charges:  $Gait Training: 8-22 mins                     Erasmo Leventhal , PTA Acute Rehabilitation Services Pager [phone] Office [phone]     Zachary Young 10/31/2019, 4:12 PM

## 2019-10-31 NOTE — Progress Notes (Signed)
Admit: 10/17/2019 LOS: 14   Subjective:  Marland Kitchen Pt refuses to have any substantial or progressive discussion today . Does not want ot start HD . Says he doesn't want to discuss any further  07/16 0701 - 07/17 0700 In: 780 [P.O.:780] Out: 1950 [Urine:1950]  Filed Weights   10/29/19 0550 10/30/19 0451 10/31/19 0445  Weight: 128.3 kg 128.3 kg 128.9 kg    Scheduled Meds: . amLODipine  10 mg Oral Daily  . aspirin EC  81 mg Oral Daily  . collagenase   Topical Daily  . docusate sodium  100 mg Oral BID  . enoxaparin (LOVENOX) injection  30 mg Subcutaneous Q24H  . gabapentin  300 mg Oral Daily   And  . gabapentin  600 mg Oral QHS  . insulin aspart  0-15 Units Subcutaneous Q4H  . insulin aspart  7 Units Subcutaneous TID WC  . insulin glargine  20 Units Subcutaneous QHS  . multivitamin with minerals  1 tablet Oral Daily  . nutrition supplement (JUVEN)  1 packet Oral BID BM  . Ensure Max Protein  11 oz Oral BID  . sodium chloride flush  3 mL Intravenous Q12H  . sodium polystyrene  10 g Oral Daily   Continuous Infusions: PRN Meds:.albuterol, HYDROmorphone (DILAUDID) injection, hydrOXYzine, metoCLOPramide **OR** metoCLOPramide (REGLAN) injection, ondansetron **OR** ondansetron (ZOFRAN) IV, oxyCODONE-acetaminophen  Current Labs: reviewed    Physical Exam:  Blood pressure 127/76, pulse 82, temperature 97.6 F (36.4 C), temperature source Oral, resp. rate 18, height 6\' 3"  (1.905 m), weight 128.9 kg, SpO2 96 %. Agitated/animated, Regular, normal S1 and S2, no rub Clear bilaterally, normal work of breathing Obese, soft, nontender, no abdominal bruits Trace edema on the left leg, right leg in a dressing Nonfocal  A 1. Progressive CKD5 with intermittent hyperkalemia; likely some uremia with nausea and vomiting; sig azotemia 2. S/p R BKA for diabetic foot ulcer with osteomyelitis 3. DM2 4. Cocaine use 5. Chronic diastolic and systolic heart failure 6. Chronic anemia, hemoglobin stable in  9s 7. Homelessness 8. Tobacco user  P . If he was willing, would offer HD.  . Call us back if he expresses openness to HD or sig change in status . Would use low dose kayexalate (5gm/d) to treat hyperkalemia, reinforce low N and K diet . Can f/u with Dr. Crissie Reese at [facility] [place] [facility] . Call with questions or concerns.  Will s/o for now   Pearson Grippe MD 10/31/2019, 11:29 AM  Recent Labs  Lab 10/28/19 0439 10/28/19 0439 10/28/19 1340 10/29/19 0531 10/30/19 0702  NA 137  --   --  136 137  K 5.9*   < > 5.5* 5.4* 5.0  CL 95*  --   --  93* 97*  CO2 28  --   --  32 29  GLUCOSE 155*  --   --  141* 133*  BUN 141*  --   --  131* 124*  CREATININE 5.14*  --   --  5.77* 5.58*  CALCIUM 10.0  --   --  9.6 9.9  PHOS 5.8*  --   --  5.9* 5.6*   < > = values in this interval not displayed.   Recent Labs  Lab 10/27/19 0623 10/27/19 0623 10/28/19 0439 10/29/19 0531 10/30/19 0702  WBC 7.9   < > 7.2 7.0 6.7  NEUTROABS 4.5  --  3.9 4.0  --   HGB 10.0*   < > 10.5* 9.5* 9.4*  HCT 33.1*   < >  34.5* 31.9* 31.5*  MCV 84.7   < > 85.6 85.5 85.4  PLT 477*   < > 404* 411* 406*   < > = values in this interval not displayed.

## 2019-10-31 NOTE — Progress Notes (Signed)
PROGRESS NOTE    Zachary Young  FFM:[id number] DOB: 11-30-1960 DOA: 10/17/2019 PCP: System, Pcp Not In    Brief Narrative:  [name] PMHxCKD stage V, DM type II controlled with complication, Diabetic Neuropathy, Chronic systolic and diastolic CHF, HTN.  Presentedafter passing out. [name] notes that he went out on to his front porch to get some fresh air. Once out there, he became dizzy and had to lean against the house, and then he passed out. He slid down the side of the house and did not hit his head. He noted no chest pain, no fever, no chills, no change in urination. He does note that he has had a decreased appetite for the last 3-4 days, eating only about half of what he normally does. He has passed out before in the past when his sugar drops, and he thinks this may have happened today. He denies chest pain. He has SOB with ambulating at times, but nothing today. He report no change in urination and feels like he is urinating the normal amount. He reports taking torsemide 80mg  in the morning and evening (4 tablets) and not missing doses. He was having issues with pain in bed, and as I looked at his legs, I noted that his right leg was swollen, warm and mildly erythematous compared to the left. When asked, he noted a new wound on the plantar surface, at the 4th and 5th digit which he noticed first about 2 weeks ago.     Consultants:  Nephrology   Procedures:   Antimicrobials:      Subjective: Complaining about his food however he is not getting his pickles and burgers and fries.  No other complaints Objective: Vitals:   10/30/19 1759 10/31/19 0000 10/31/19 0445 10/31/19 0836  BP: 121/71 127/67 139/71 127/76  Pulse: 77 77 85 82  Resp: 18 18 20 18   Temp: 97.8 F (36.6 C) 98.6 F (37 C) 97.6 F (36.4 C)   TempSrc: Oral Oral Oral   SpO2: 95% 96% 96%   Weight:   128.9 kg   Height:        Intake/Output Summary (Last 24 hours) at 10/31/2019 1120 Last data  filed at 10/31/2019 0700 Gross per 24 hour  Intake 480 ml  Output 1950 ml  Net -1470 ml   Filed Weights   10/29/19 0550 10/30/19 0451 10/31/19 0445  Weight: 128.3 kg 128.3 kg 128.9 kg    Examination:  General exam: NAD laying in bed comfortably, agitated Respiratory system: CTA, no rales wheezes rhonchi's Cardiovascular system: RRR, S1-S2 no murmurs rubs gallops  Gastrointestinal system: Soft, NT/ND positive bowel sounds . Central nervous system: Awake alert, grossly intact Extremities:  Right BKA, no edema bilaterally Skin: Warm dry Psychiatry: Mood affect not appropriate, agitated    Data Reviewed: I have personally reviewed following labs and imaging studies  CBC: Recent Labs  Lab 10/25/19 0202 10/25/19 0202 10/26/19 0347 10/27/19 0623 10/28/19 0439 10/29/19 0531 10/30/19 0702  WBC 6.9   < > 7.2 7.9 7.2 7.0 6.7  NEUTROABS 3.8  --  3.9 4.5 3.9 4.0  --   HGB 9.7*   < > 10.4* 10.0* 10.5* 9.5* 9.4*  HCT 31.4*   < > 34.0* 33.1* 34.5* 31.9* 31.5*  MCV 84.9   < > 84.6 84.7 85.6 85.5 85.4  PLT 468*   < > 497* 477* 404* 411* 406*   < > = values in this interval not displayed.   Basic Metabolic Panel: Recent  Labs  Lab 10/25/19 0202 10/25/19 0202 10/26/19 0347 10/27/19 0623 10/28/19 0439 10/28/19 1340 10/29/19 0531 10/30/19 0702  NA 139  --  136  --  137  --  136 137  K 5.1   < > 5.6*  --  5.9* 5.5* 5.4* 5.0  CL 100  --  95*  --  95*  --  93* 97*  CO2 27  --  30  --  28  --  32 29  GLUCOSE 167*  --  211*  --  155*  --  141* 133*  BUN 127*  --  131*  --  141*  --  131* 124*  CREATININE 4.77*  --  4.94*  --  5.14*  --  5.77* 5.58*  CALCIUM 10.4*  --  11.4*  --  10.0  --  9.6 9.9  MG 2.3  --  2.5* 2.4 2.5*  --  2.6*  --   PHOS 7.1*   < > 6.7* 6.6* 5.8*  --  5.9* 5.6*   < > = values in this interval not displayed.   GFR: Estimated Creatinine Clearance: 21.1 mL/min (A) (by C-G formula based on SCr of 5.58 mg/dL (H)). Liver Function Tests: Recent Labs  Lab  10/25/19 0202 10/26/19 0347 10/28/19 0439 10/29/19 0531 10/30/19 0702  AST 13* 13* 36 29  --   ALT 5 7 16 20   --   ALKPHOS 77 95 106 103  --   BILITOT 0.3 0.4 0.6 0.1*  --   PROT 8.1 8.7* 8.1 8.1  --   ALBUMIN 2.5* 2.7* 2.6* 2.6* 2.5*   No results for input(s): LIPASE, AMYLASE in the last 168 hours. No results for input(s): AMMONIA in the last 168 hours. Coagulation Profile: No results for input(s): INR, PROTIME in the last 168 hours. Cardiac Enzymes: No results for input(s): CKTOTAL, CKMB, CKMBINDEX, TROPONINI in the last 168 hours. BNP (last 3 results) No results for input(s): PROBNP in the last 8760 hours. HbA1C: No results for input(s): HGBA1C in the last 72 hours. CBG: Recent Labs  Lab 10/30/19 2012 10/31/19 0011 10/31/19 0443 10/31/19 0758 10/31/19 1101  GLUCAP 180* 188* 171* 161* 218*   Lipid Profile: No results for input(s): CHOL, HDL, LDLCALC, TRIG, CHOLHDL, LDLDIRECT in the last 72 hours. Thyroid Function Tests: No results for input(s): TSH, T4TOTAL, FREET4, T3FREE, THYROIDAB in the last 72 hours. Anemia Panel: No results for input(s): VITAMINB12, FOLATE, FERRITIN, TIBC, IRON, RETICCTPCT in the last 72 hours. Sepsis Labs: No results for input(s): PROCALCITON, LATICACIDVEN in the last 168 hours.  No results found for this or any previous visit (from the past 240 hour(s)).       Radiology Studies: US RENAL  Result Date: 10/29/2019 CLINICAL DATA:  Acute exacerbation of chronic renal disease EXAM: RENAL / URINARY TRACT ULTRASOUND COMPLETE COMPARISON:  None. FINDINGS: Right Kidney: Renal measurements: 11.1 x 5.4 x 5.6 cm = volume: 172.4 mL . Echogenicity is increased. Renal cortical thickness is low normal. No mass, perinephric fluid, or hydronephrosis visualized. No sonographically demonstrable calculus or ureterectasis. Left Kidney: Renal measurements: 9.0 x 5.3 x 4.3 cm = volume: 107.4 mL. Echogenicity is increased. Renal cortical thickness within normal  limits. No mass, perinephric fluid, or hydronephrosis visualized. No sonographically demonstrable calculus or ureterectasis. Bladder: Appears normal for degree of bladder distention. Other: None. IMPRESSION: Increased renal echogenicity, a finding indicative of medical renal disease. No obstructing focus in either kidney. Borderline size discrepancy between kidneys. Significance of this finding  uncertain. This finding potentially could indicate a degree of renal artery stenosis on the left. In this regard, question whether patient is hypertensive. Study otherwise unremarkable. Note that there is overlying gas making evaluation of the left kidney somewhat less than optimal. Electronically Signed   By: Lowella Grip III M.D.   On: 10/29/2019 14:44        Scheduled Meds: . amLODipine  10 mg Oral Daily  . aspirin EC  81 mg Oral Daily  . collagenase   Topical Daily  . docusate sodium  100 mg Oral BID  . enoxaparin (LOVENOX) injection  30 mg Subcutaneous Q24H  . gabapentin  300 mg Oral Daily   And  . gabapentin  600 mg Oral QHS  . insulin aspart  0-15 Units Subcutaneous Q4H  . insulin aspart  7 Units Subcutaneous TID WC  . insulin glargine  20 Units Subcutaneous QHS  . multivitamin with minerals  1 tablet Oral Daily  . nutrition supplement (JUVEN)  1 packet Oral BID BM  . Ensure Max Protein  11 oz Oral BID  . sodium chloride flush  3 mL Intravenous Q12H   Continuous Infusions:  Assessment & Plan:   Active Problems:   Hypertensive heart disease with CHF (congestive heart failure) (HCC)   Chronic kidney disease, stage 4 (severe) (HCC)   HLD (hyperlipidemia)   HTN (hypertension)   Systolic and diastolic CHF, chronic (HCC)   Tobacco use disorder   Type 2 diabetes mellitus with circulatory disorder, with long-term current use of insulin (HCC)   Wound of right foot   Syncope and collapse   Cocaine abuse ([place])   Subacute osteomyelitis, right ankle and foot (HCC)   Abscess of right  foot   Diabetic neuropathy (HCC)   Anemia of chronic disease   Severe obesity (BMI 35.0-39.9) with comorbidity ([place])   Metabolic acidosis   DM (diabetes mellitus), secondary, uncontrolled, with complications (HCC)   Elevated sedimentation rate   Elevated C-reactive protein (CRP)   Hypoalbuminemia   Goals of care, counseling/discussion   Palliative care by specialist   Right foot wound status post BKA on 7/7 This was done by Dr. Sharol Given.  Patient denies any pain currently.  Appears to have completed treatment with antibiotics.  Acute renal failure on chronic kidney disease stage IV, with hyperkalemia Apparently followed by nephrology in [place].  Seen by nephrology on 7/8 here in this hospital.  Not thought to be a good candidate for hemodialysis. Over the last couple of months his creatinine has fluctuated anywhere between 3.9-4.7.  Came in with creatinine of 5.74.   It appears that it had improved to 4.7 but over the last 3 days it has worsened.  Patient was getting diuretics and increase in BUN was noted.  His diuretics were held.  He was thought to be developing uremic symptoms so he was started on gentle IV hydration as he was thought to have diuresed too aggressively.  In the last 2 - 3 months he has lost about 40 pounds or so.  BUN is slightly better today.  We will continue with IV fluids for another 24 hours.  Recheck his labs tomorrow. Patient was seen by nephrology on July 8.  We may have to reconsult if renal function does not improve.  Since he does have good urine output we can continue current management for now.  Renal US: Medical renal disease.  No obstructing focus in either kidney.  There is question of degree of renal  artery stenosis in the left question whether patient is hypertensive.    Kayexalate  With improvement Today's lab pending we will follow up Nephrology was reconsulted, input was appreciated-plan to explore dialysis if patient is willing We will continue  Kayexalate daily dosing of 10 g to keep his potassium stable Avoid morphine, can use hydromorphone, fentanyl methadone Baclofen should be avoided Avoid oral sodium phosphate and magnesium citrate-based laxative bowel preps  Nausea and vomiting Abdominal film unremarkable. Improved , without any complaints today symptoms most likely due to uremia   Diabetes mellitus type 2, uncontrolled with renal complications with chronic kidney disease CBGs are poorly controlled.  HbA1c 8.8.  He is on SSI Lantus.  Was hypoglycemic yesterda Will Continue Novolog at 7units tid with meals , continue to monitor. Will continue Lantus    Syncope Likely multifactorial including overdiuresis and acute kidney injury and acute infection.  No further episodes in the hospital.   Continue to monitor   Chronic diastolic CHF EF 50 to 54% based on echocardiogram done in May.  He has been diuresed aggressively perhaps to the point of becoming hypovolemic as discussed above.   Diuretics are now on hold.  He has lost about 40 pounds or so in the last 2 months.   Appears to be euvolemic continue to monitor Discussed importance of low sodium/renal diet with pt as he cannot demand for salty foods such as pickles.    Essential hypertension Blood pressures remained stable.   Continue to monitor  Anemia of chronic kidney disease Hemoglobin is stable.  No evidence of overt blood loss  Hyperlipidemia LDL 50.  Tobacco use disorder Counseling to be provided  History of cocaine abuse His urine drug screen from 7/4 was positive for cocaine  Obesity Estimated body mass index is 35.35 kg/m as calculated from the following:   Height as of this encounter: 6\' 3"  (1.905 m).   Weight as of this encounter: 128.3 kg.  Behavioral issues Patient has exhibited belligerent behavior with staff.  He becomes verbally abusive.  Appears to have very poor understanding of his multiple medical problems.  He does not seem  to understand the severity of his illness.  Does not seem to understand the risks of refusing treatment.  This was also seen by the psychiatrist when they evaluated him on 7/9.  At this time I do not feel that the patient has capacity to make medical decisions including disposition.  May need to pursue guardianship.  However at the same time uremia could be contributing to alteration in his mentation.  May need to reassess his mentation once his renal function and BUN is better.  Goals of care Seen by palliative care previously.  Their note also suggested that the patient demonstrated no understanding of his numerous medical problems.  DVT Prophylaxis: On Lovenox Code Status: Full code Family Communication:  No family at bedside Disposition Plan:  Status is: Inpatient    Status is: Inpatient  Remains inpatient appropriate because:Inpatient level of care appropriate due to severity of illness   Dispo:  Patient From: Home  Planned Disposition: To be determined  Expected discharge date: 11/02/19  Medically stable for discharge: No as patient may need hemodialysis.             LOS: 14 days   Time spent: 40 min with >50% on coc    Nolberto Hanlon, MD [facility] Pager [phone]  If 7PM-7AM, please contact night-coverage [email or web address] Password [facility] 10/31/2019, 11:20 AM

## 2019-11-01 ENCOUNTER — Inpatient Hospital Stay (HOSPITAL_COMMUNITY): Payer: Medicaid Other

## 2019-11-01 DIAGNOSIS — L02611 Cutaneous abscess of right foot: Secondary | ICD-10-CM | POA: Diagnosis not present

## 2019-11-01 LAB — BASIC METABOLIC PANEL
Anion gap: 12 (ref 5–15)
BUN: 120 mg/dL — ABNORMAL HIGH (ref 6–20)
CO2: 25 mmol/L (ref 22–32)
Calcium: 9.6 mg/dL (ref 8.9–10.3)
Chloride: 99 mmol/L (ref 98–111)
Creatinine, Ser: 5.64 mg/dL — ABNORMAL HIGH (ref 0.61–1.24)
GFR calc Af Amer: 12 mL/min — ABNORMAL LOW (ref >60)
GFR calc non Af Amer: 10 mL/min — ABNORMAL LOW (ref >60)
Glucose, Bld: 134 mg/dL — ABNORMAL HIGH (ref 70–99)
Potassium: 5.9 mmol/L — ABNORMAL HIGH (ref 3.5–5.1)
Sodium: 136 mmol/L (ref 135–145)

## 2019-11-01 LAB — CBC
HCT: 33.8 % — ABNORMAL LOW (ref 39.0–52.0)
Hemoglobin: 10.3 g/dL — ABNORMAL LOW (ref 13.0–17.0)
MCH: 26.3 pg (ref 26.0–34.0)
MCHC: 30.5 g/dL (ref 30.0–36.0)
MCV: 86.4 fL (ref 80.0–100.0)
Platelets: 409 10*3/uL — ABNORMAL HIGH (ref 150–400)
RBC: 3.91 MIL/uL — ABNORMAL LOW (ref 4.22–5.81)
RDW: 15.1 % (ref 11.5–15.5)
WBC: 7.3 10*3/uL (ref 4.0–10.5)
nRBC: 0 % (ref 0.0–0.2)

## 2019-11-01 LAB — GLUCOSE, CAPILLARY
Glucose-Capillary: 111 mg/dL — ABNORMAL HIGH (ref 70–99)
Glucose-Capillary: 131 mg/dL — ABNORMAL HIGH (ref 70–99)
Glucose-Capillary: 137 mg/dL — ABNORMAL HIGH (ref 70–99)
Glucose-Capillary: 145 mg/dL — ABNORMAL HIGH (ref 70–99)
Glucose-Capillary: 176 mg/dL — ABNORMAL HIGH (ref 70–99)
Glucose-Capillary: 195 mg/dL — ABNORMAL HIGH (ref 70–99)
Glucose-Capillary: 235 mg/dL — ABNORMAL HIGH (ref 70–99)

## 2019-11-01 LAB — POTASSIUM: Potassium: 4.5 mmol/L (ref 3.5–5.1)

## 2019-11-01 MED ORDER — SODIUM POLYSTYRENE SULFONATE 15 GM/60ML PO SUSP: 15.0000 g | Freq: Every day | ORAL | Status: DC

## 2019-11-01 MED ORDER — CALCIUM GLUCONATE-NACL 1-0.675 GM/50ML-% IV SOLN
1.0000 g | Freq: Once | INTRAVENOUS | Status: AC
  Administered 2019-11-01: 1000 mg via INTRAVENOUS
  Filled 2019-11-01: qty 50

## 2019-11-01 MED ORDER — SODIUM POLYSTYRENE SULFONATE 15 GM/60ML PO SUSP
30.0000 g | Freq: Every day | ORAL | Status: DC
  Administered 2019-11-01: 30 g via ORAL
  Filled 2019-11-01: qty 120

## 2019-11-01 NOTE — Progress Notes (Addendum)
PROGRESS NOTE  Zachary Young XHB:[id number] DOB: 1960-06-21 DOA: 10/17/2019 PCP: System, Pcp Not In  HPI/Recap of past 24 hours:  [name] PMHxCKD stage V, DM type II controlled with complication, Diabetic Neuropathy, Chronic systolic and diastolic CHF, HTN. Presentedafter passing out. Zachary Young notes that he went out on to his front porch to get some fresh air. Once out there, he became dizzy and had to lean against the house, and then he passed out. He slid down the side of the house and did not hit his head. He noted no chest pain, no fever, no chills, no change in urination. He does note that he has had a decreased appetite for the last 3-4 days, eating only about half of what he normally does. He has passed out before in the past when his sugar drops, and he thinks this may have happened today. He denies chest pain. He has SOB with ambulating at times, but nothing today. He report no change in urination and feels like he is urinating the normal amount. He reports taking torsemide 80mg  in the morning and evening (4 tablets) and not missing doses. He was having issues with pain in bed, and as I looked at his legs, I noted that his right leg was swollen, warm and mildly erythematous compared to the left. When asked, he noted a new wound on the plantar surface, at the 4th and 5th digit which he noticed first about 2 weeks ago.   11/01/19: Alert and oriented x3.  Advised on worsening renal function.  He declines discussing his renal function.   Assessment/Plan: Active Problems:   Hypertensive heart disease with CHF (congestive heart failure) (HCC)   Chronic kidney disease, stage 4 (severe) (HCC)   HLD (hyperlipidemia)   HTN (hypertension)   Systolic and diastolic CHF, chronic (HCC)   Tobacco use disorder   Type 2 diabetes mellitus with circulatory disorder, with long-term current use of insulin (HCC)   Wound of right foot   Syncope and collapse   Cocaine abuse ([place])    Subacute osteomyelitis, right ankle and foot (HCC)   Abscess of right foot   Diabetic neuropathy (HCC)   Anemia of chronic disease   Severe obesity (BMI 35.0-39.9) with comorbidity ([place])   Metabolic acidosis   DM (diabetes mellitus), secondary, uncontrolled, with complications (HCC)   Elevated sedimentation rate   Elevated C-reactive protein (CRP)   Hypoalbuminemia   Goals of care, counseling/discussion   Palliative care by specialist  Right foot wound status post BKA on 10/21/19 This was done by Dr. Sharol Given. Patient denies any pain currently. Appears to have completed treatment with antibiotics.  Acute renal failure on chronic kidney disease stage IV, with hyperkalemia Apparently followed by nephrology in [place]. Seen by nephrology on 7/8 here in this hospital. Not thought to be a good candidate for hemodialysis. Over the last couple of months his creatinine has fluctuated anywhere between3.9-4.7.Came in with creatinine of 5.74. Creatinine uptrending 5.6 from 5.5 with GFR of 12 His diuretics were held. He was thought to be developing uremic symptoms so he was started on gentle IV hydration. Patient was seen by nephrology on July 8.  Renal US: Medical renal disease.  No obstructing focus in either kidney.  There is question of degree of renal artery stenosis in the left question whether patient is hypertensive.    Kayexalate for hyperkalemia With improvement Increase Kayexalate dose to 15 mg daily  Intractable nausea and vomiting Abdominal film unremarkable. Self-reported pain vomiting  last night Continue IV antiemetics as needed symptoms most likely due to uremia  Diabetes mellitus type 2, uncontrolled with renal complications with chronic kidney disease CBGs are poorly controlled. HbA1c 8.8.  He is on SSI  Will Continue Novolog at 7units tid with meals , continue to monitor.  Syncope Likely multifactorial including overdiuresis and acute kidney injury and acute  infection. No further episodes in the hospital.  Continue to monitor  Chronic diastolic CHF EF 50 to 71% based on echocardiogram done in May. He has been diuresed aggressively perhaps to the point of becoming hypovolemic as discussed above.  Diuretics are now on hold. He has lost about 40 pounds or so in the last 2 months.  Appears to be euvolemic continue to monitor Discussed importance of low sodium/renal diet with pt as he cannot demand for salty foods such as pickles.   Essential hypertension Blood pressures remained stable. On amlodipine 10 mg daily   Continue to monitor vital signs  Anemia of chronic kidney disease Hemoglobin is stable. No evidence of overt blood loss Hemoglobin uptrending  History of cocaine abuse His urine drug screen from 7/4 was positive for cocaine Not receptive to counseling  Obesity Estimated body mass index is 35.35 kg/m as calculated from the following: Height as of this encounter: 6\' 3"  (1.905 m). Weight as of this encounter: 128.3 kg.  Behavioral issues Patient has exhibited belligerent behavior with staff. He becomes verbally abusive. Appears to have very poor understanding of his multiple medical problems. He does not seem to understand the severity of his illness. Does not seem to understand the risks of refusing treatment. This was also seen by the psychiatrist when they evaluated him on 7/9. However at the same time uremia could be contributing to alteration in his mentation. May need to reassess his mentation once his renal function and BUN is better.  Goals of care Seen by palliative care previously. Their note also suggested that the patient demonstrated no understanding of his numerous medical problems.  DVT Prophylaxis:On Lovenox subcu daily Code Status:Full code Family Communication: No family at bedside Disposition Plan:Status is: Inpatient    Status is: Inpatient  Remains inpatient  appropriate because:Inpatient level of care appropriate due to severity of illness   Dispo:             Patient From: Home             Planned Disposition: To be determined             Expected discharge date: 11/02/19             Medically stable for discharge: No as patient may need hemodialysis.       Objective: Vitals:   10/31/19 1200 10/31/19 1821 11/01/19 0036 11/01/19 0518  BP: (!) 149/89 118/73 120/65 130/86  Pulse: 92 79 87 85  Resp: 16 16 19    Temp: 98.2 F (36.8 C) 97.7 F (36.5 C) 98.1 F (36.7 C) 97.9 F (36.6 C)  TempSrc: Oral Oral Oral Oral  SpO2: 95% 97% 99% 99%  Weight:      Height:        Intake/Output Summary (Last 24 hours) at 11/01/2019 0813 Last data filed at 11/01/2019 0435 Gross per 24 hour  Intake 960 ml  Output 1200 ml  Net -240 ml   Filed Weights   10/29/19 0550 10/30/19 0451 10/31/19 0445  Weight: 128.3 kg 128.3 kg 128.9 kg    Exam:  . General: 59 y.o. year-old  male well developed well nourished in no acute distress.  Alert and oriented x3. . Cardiovascular: Regular rate and rhythm with no rubs or gallops.  No thyromegaly or JVD noted.   Marland Kitchen Respiratory: Clear to auscultation with no wheezes or rales.  Poor inspiratory effort. . Abdomen: Soft obese, diffusely tender with palpation with normal bowel sounds. . Musculoskeletal: Right below the knee amputation.  Trace edema in left lower extremity . Psychiatry: Mood is irritable.  Data Reviewed: CBC: Recent Labs  Lab 10/26/19 0347 10/26/19 0347 10/27/19 0623 10/27/19 0623 10/28/19 0439 10/29/19 0531 10/30/19 0702 10/31/19 1040 11/01/19 0444  WBC 7.2   < > 7.9   < > 7.2 7.0 6.7 8.1 7.3  NEUTROABS 3.9  --  4.5  --  3.9 4.0  --   --   --   HGB 10.4*   < > 10.0*   < > 10.5* 9.5* 9.4* 9.3* 10.3*  HCT 34.0*   < > 33.1*   < > 34.5* 31.9* 31.5* 31.5* 33.8*  MCV 84.6   < > 84.7   < > 85.6 85.5 85.4 86.3 86.4  PLT 497*   < > 477*   < > 404* 411* 406* 429* 409*   < > = values in this  interval not displayed.   Basic Metabolic Panel: Recent Labs  Lab 10/26/19 0347 10/26/19 0347 10/27/19 0623 10/28/19 0439 10/28/19 1340 10/29/19 0531 10/30/19 0702 11/01/19 0456  NA 136  --   --  137  --  136 137 136  K 5.6*   < >  --  5.9* 5.5* 5.4* 5.0 5.9*  CL 95*  --   --  95*  --  93* 97* 99  CO2 30  --   --  28  --  32 29 25  GLUCOSE 211*  --   --  155*  --  141* 133* 134*  BUN 131*  --   --  141*  --  131* 124* 120*  CREATININE 4.94*  --   --  5.14*  --  5.77* 5.58* 5.64*  CALCIUM 11.4*  --   --  10.0  --  9.6 9.9 9.6  MG 2.5*  --  2.4 2.5*  --  2.6*  --   --   PHOS 6.7*  --  6.6* 5.8*  --  5.9* 5.6*  --    < > = values in this interval not displayed.   GFR: Estimated Creatinine Clearance: 20.9 mL/min (A) (by C-G formula based on SCr of 5.64 mg/dL (H)). Liver Function Tests: Recent Labs  Lab 10/26/19 0347 10/28/19 0439 10/29/19 0531 10/30/19 0702  AST 13* 36 29  --   ALT 7 16 20   --   ALKPHOS 95 106 103  --   BILITOT 0.4 0.6 0.1*  --   PROT 8.7* 8.1 8.1  --   ALBUMIN 2.7* 2.6* 2.6* 2.5*   No results for input(s): LIPASE, AMYLASE in the last 168 hours. No results for input(s): AMMONIA in the last 168 hours. Coagulation Profile: No results for input(s): INR, PROTIME in the last 168 hours. Cardiac Enzymes: No results for input(s): CKTOTAL, CKMB, CKMBINDEX, TROPONINI in the last 168 hours. BNP (last 3 results) No results for input(s): PROBNP in the last 8760 hours. HbA1C: No results for input(s): HGBA1C in the last 72 hours. CBG: Recent Labs  Lab 10/31/19 2038 11/01/19 0019 11/01/19 0417 11/01/19 0539 11/01/19 0757  GLUCAP 214* 235* 131* 111* 137*   Lipid  Profile: No results for input(s): CHOL, HDL, LDLCALC, TRIG, CHOLHDL, LDLDIRECT in the last 72 hours. Thyroid Function Tests: No results for input(s): TSH, T4TOTAL, FREET4, T3FREE, THYROIDAB in the last 72 hours. Anemia Panel: No results for input(s): VITAMINB12, FOLATE, FERRITIN, TIBC, IRON,  RETICCTPCT in the last 72 hours. Urine analysis:    Component Value Date/Time   COLORURINE YELLOW 08/15/2019 1538   APPEARANCEUR CLEAR 08/15/2019 1538   LABSPEC 1.010 08/15/2019 1538   PHURINE 5.0 08/15/2019 1538   GLUCOSEU NEGATIVE 08/15/2019 1538   HGBUR SMALL (A) 08/15/2019 1538   BILIRUBINUR NEGATIVE 08/15/2019 1538   KETONESUR NEGATIVE 08/15/2019 1538   PROTEINUR >=300 (A) 08/15/2019 1538   NITRITE NEGATIVE 08/15/2019 1538   LEUKOCYTESUR NEGATIVE 08/15/2019 1538   Sepsis Labs: @LABRCNTIP (procalcitonin:4,lacticidven:4)  )No results found for this or any previous visit (from the past 240 hour(s)).    Studies: DG CHEST PORT 1 VIEW  Result Date: 11/01/2019 CLINICAL DATA:  Shortness of breath. EXAM: PORTABLE CHEST 1 VIEW COMPARISON:  Aug 30, 2019. FINDINGS: Normal cardiomediastinal silhouette. No pneumothorax or pleural effusion is noted. Minimal interstitial densities are noted in both lung bases which may represent scarring or possibly edema. The visualized skeletal structures are unremarkable. IMPRESSION: Minimal interstitial densities are noted in both lung bases which may represent scarring or possibly edema. Electronically Signed   By: Marijo Conception M.D.   On: 11/01/2019 07:57    Scheduled Meds: . amLODipine  10 mg Oral Daily  . aspirin EC  81 mg Oral Daily  . collagenase   Topical Daily  . docusate sodium  100 mg Oral BID  . enoxaparin (LOVENOX) injection  30 mg Subcutaneous Q24H  . gabapentin  300 mg Oral Daily   And  . gabapentin  600 mg Oral QHS  . insulin aspart  0-15 Units Subcutaneous Q4H  . insulin aspart  7 Units Subcutaneous TID WC  . insulin glargine  20 Units Subcutaneous QHS  . multivitamin with minerals  1 tablet Oral Daily  . nutrition supplement (JUVEN)  1 packet Oral BID BM  . Ensure Max Protein  11 oz Oral BID  . sodium chloride flush  3 mL Intravenous Q12H  . sodium polystyrene  30 g Oral Daily    Continuous Infusions: . calcium gluconate         LOS: 15 days     Kayleen Memos, MD [facility] Pager [phone]  If 7PM-7AM, please contact night-coverage [email or web address] Password [facility] 11/01/2019, 8:13 AM

## 2019-11-01 NOTE — Progress Notes (Signed)
Updated the patient's sister Ms. [name] via phone.  Per his sister he is presently homeless.  He has difficulty understanding the severity of his medical condition.  Her along with his other sister are making medical decisions for him.  She is planning to visit tomorrow.

## 2019-11-02 DIAGNOSIS — L02611 Cutaneous abscess of right foot: Secondary | ICD-10-CM | POA: Diagnosis not present

## 2019-11-02 LAB — BASIC METABOLIC PANEL
Anion gap: 12 (ref 5–15)
BUN: 111 mg/dL — ABNORMAL HIGH (ref 6–20)
CO2: 23 mmol/L (ref 22–32)
Calcium: 9.5 mg/dL (ref 8.9–10.3)
Chloride: 101 mmol/L (ref 98–111)
Creatinine, Ser: 5.42 mg/dL — ABNORMAL HIGH (ref 0.61–1.24)
GFR calc Af Amer: 12 mL/min — ABNORMAL LOW (ref >60)
GFR calc non Af Amer: 11 mL/min — ABNORMAL LOW (ref >60)
Glucose, Bld: 158 mg/dL — ABNORMAL HIGH (ref 70–99)
Potassium: 4.5 mmol/L (ref 3.5–5.1)
Sodium: 136 mmol/L (ref 135–145)

## 2019-11-02 LAB — CBC
HCT: 32.7 % — ABNORMAL LOW (ref 39.0–52.0)
Hemoglobin: 10.1 g/dL — ABNORMAL LOW (ref 13.0–17.0)
MCH: 26.1 pg (ref 26.0–34.0)
MCHC: 30.9 g/dL (ref 30.0–36.0)
MCV: 84.5 fL (ref 80.0–100.0)
Platelets: 370 10*3/uL (ref 150–400)
RBC: 3.87 MIL/uL — ABNORMAL LOW (ref 4.22–5.81)
RDW: 15.2 % (ref 11.5–15.5)
WBC: 7.1 10*3/uL (ref 4.0–10.5)
nRBC: 0 % (ref 0.0–0.2)

## 2019-11-02 LAB — GLUCOSE, CAPILLARY
Glucose-Capillary: 124 mg/dL — ABNORMAL HIGH (ref 70–99)
Glucose-Capillary: 137 mg/dL — ABNORMAL HIGH (ref 70–99)
Glucose-Capillary: 138 mg/dL — ABNORMAL HIGH (ref 70–99)
Glucose-Capillary: 169 mg/dL — ABNORMAL HIGH (ref 70–99)
Glucose-Capillary: 174 mg/dL — ABNORMAL HIGH (ref 70–99)
Glucose-Capillary: 206 mg/dL — ABNORMAL HIGH (ref 70–99)

## 2019-11-02 NOTE — Progress Notes (Signed)
PROGRESS NOTE  Zachary Young TDH:[id number] DOB: [birth year] DOA: 10/17/2019 PCP: System, Pcp Not In  HPI/Recap of past 24 hours:  [name] PMHxCKD stage V, DM type II controlled with complication, Diabetic Neuropathy, Chronic systolic and diastolic CHF, HTN. Presentedafter passing out. [name] notes that he went out on to his front porch to get some fresh air. Once out there, he became dizzy and had to lean against the house, and then he passed out. He slid down the side of the house and did not hit his head. He noted no chest pain, no fever, no chills, no change in urination. He does note that he has had a decreased appetite for the last 3-4 days, eating only about half of what he normally does. He has passed out before in the past when his sugar drops, and he thinks this may have happened today. He denies chest pain. He has SOB with ambulating at times, but nothing today. He report no change in urination and feels like he is urinating the normal amount. He reports taking torsemide 80mg  in the morning and evening (4 tablets) and not missing doses. He was having issues with pain in bed, and as I looked at his legs, I noted that his right leg was swollen, warm and mildly erythematous compared to the left. When asked, he noted a new wound on the plantar surface, at the 4th and 5th digit which he noticed first about 2 weeks ago.   Updated the patient's sister Ms. Zachary Young via phone.  Per his sister he is presently homeless.  He has difficulty understanding the severity of his medical condition.  Her along with his other sister are making medical decisions for him.  She is planning to visit.  11/02/19: Seen at his bedside.  Non cooperative.  Palliative care team reconsulted to assist with establishing goals of care.  His sisters Zachary Young and Zachary Young are his medical decision makers.   Assessment/Plan: Active Problems:   Hypertensive heart disease with CHF (congestive heart failure) (HCC)    Chronic kidney disease, stage 4 (severe) (HCC)   HLD (hyperlipidemia)   HTN (hypertension)   Systolic and diastolic CHF, chronic (HCC)   Tobacco use disorder   Type 2 diabetes mellitus with circulatory disorder, with long-term current use of insulin (HCC)   Wound of right foot   Syncope and collapse   Cocaine abuse ([place])   Subacute osteomyelitis, right ankle and foot (HCC)   Abscess of right foot   Diabetic neuropathy (HCC)   Anemia of chronic disease   Severe obesity (BMI 35.0-39.9) with comorbidity ([place])   Metabolic acidosis   DM (diabetes mellitus), secondary, uncontrolled, with complications (HCC)   Elevated sedimentation rate   Elevated C-reactive protein (CRP)   Hypoalbuminemia   Goals of care, counseling/discussion   Palliative care by specialist  Right foot wound status post BKA on 10/21/19 This was done by Dr. Sharol Given. Patient denies any pain currently. Appears to have completed treatment with antibiotics.  Acute renal failure on chronic kidney disease stage IV, with hyperkalemia Apparently followed by nephrology in [place]. Seen by nephrology on 7/8 here in this hospital. Not thought to be a good candidate for hemodialysis. Over the last couple of months his creatinine has fluctuated anywhere between3.9-4.7.Came in with creatinine of 5.74. Creatinine downtrending 5.4 from 5.6 from 5.5 with GFR of 12 Continue to hold off diuretics and nephrotoxins Patient was seen by nephrology on July 8.  Renal US: Medical renal disease.  No  obstructing focus in either kidney.    Kayexalate for hyperkalemia With improvement Increase Kayexalate dose to 15 mg daily  Hyperkalemia in the setting of renal sufficiency Serum potassium 6.0 Was treated with 1 dose of IV calcium gluconate Kayexalate 30 g once Continue Kayexalate 15g daily Continue to monitor serum potassium  Intractable nausea and vomiting Abdominal film unremarkable. Denies nausea this morning Continue IV  antiemetics as needed symptoms most likely due to uremia  Diabetes mellitus type 2, uncontrolled with renal complications with chronic kidney disease CBGs are poorly controlled. HbA1c 8.8.  He is on SSI  Will Continue Novolog at 7units tid with meals , continue to monitor.  Syncope Likely multifactorial including overdiuresis and acute kidney injury and acute infection. No further episodes in the hospital.  Continue to monitor  Chronic diastolic CHF EF 50 to 58% based on echocardiogram done in May. He has been diuresed aggressively perhaps to the point of becoming hypovolemic as discussed above.  Diuretics are now on hold. He has lost about 40 pounds or so in the last 2 months.  Appears to be euvolemic continue to monitor Discussed importance of low sodium/renal diet with pt as he cannot demand for salty foods such as pickles.   Essential hypertension Blood pressures remained stable. On amlodipine 10 mg daily   Continue to monitor vital signs  Anemia of chronic kidney disease Hemoglobin is stable. No evidence of overt blood loss Hemoglobin stable 10.1  History of cocaine abuse His urine drug screen from 7/4 was positive for cocaine Not receptive to counseling  Obesity Estimated body mass index is 35.35 kg/m as calculated from the following: Height as of this encounter: 6\' 3"  (1.905 m). Weight as of this encounter: 128.3 kg.  Behavioral issues Patient has exhibited belligerent behavior with staff. He becomes verbally abusive. Appears to have very poor understanding of his multiple medical problems. He does not seem to understand the severity of his illness. Does not seem to understand the risks of refusing treatment. This was also seen by the psychiatrist when they evaluated him on 7/9. However at the same time uremia could be contributing to alteration in his mentation. May need to reassess his mentation once his renal function and BUN is  better.  Goals of care Seen by palliative care previously. Their note also suggested that the patient demonstrated no understanding of his numerous medical problems. Palliative care team reconsulted to assist with establishing goals of care His sisters Zachary Young and Zachary Young are his medical decision makers.   DVT Prophylaxis:On Lovenox subcu daily Code Status:Full code Family Communication: No family at bedside Disposition Plan:Status is: Inpatient    Status is: Inpatient  Remains inpatient appropriate because:Inpatient level of care appropriate due to severity of illness   Dispo:             Patient From: Home             Planned Disposition: To be determined             Expected discharge date: 11/03/19             Medically stable for discharge: No as patient may need hemodialysis.       Objective: Vitals:   11/02/19 0041 11/02/19 0300 11/02/19 0626 11/02/19 1227  BP: (!) 152/75  (!) 146/81 (!) 119/58  Pulse: 78  89 81  Resp: 18  18 16   Temp: 98 F (36.7 C)  97.9 F (36.6 C) 97.9 F (36.6 C)  TempSrc:  Oral  Oral Oral  SpO2: 100% 100% 100% 97%  Weight:   129 kg   Height:        Intake/Output Summary (Last 24 hours) at 11/02/2019 1301 Last data filed at 11/02/2019 0600 Gross per 24 hour  Intake 410 ml  Output 1500 ml  Net -1090 ml   Filed Weights   10/30/19 0451 10/31/19 0445 11/02/19 0626  Weight: 128.3 kg 128.9 kg 129 kg    Exam:  . General: 59 y.o. year-old male obese no acute distress.  Alert.  Irritable mood. . Cardiovascular: Regular rate and rhythm no rubs or gallops.   Marland Kitchen Respiratory: Clear to auscultation no wheezes no rales. . Abdomen: Obese nontender normal bowel sounds present . Musculoskeletal: Right below the knee amputation and surgical dressing.  Trace edema in left lower extremity . Psychiatry: Mood is irritable.  Data Reviewed: CBC: Recent Labs  Lab 10/27/19 0623 10/27/19 0623 10/28/19 0439 10/28/19 0439 10/29/19 0531  10/30/19 0702 10/31/19 1040 11/01/19 0444 11/02/19 0613  WBC 7.9   < > 7.2   < > 7.0 6.7 8.1 7.3 7.1  NEUTROABS 4.5  --  3.9  --  4.0  --   --   --   --   HGB 10.0*   < > 10.5*   < > 9.5* 9.4* 9.3* 10.3* 10.1*  HCT 33.1*   < > 34.5*   < > 31.9* 31.5* 31.5* 33.8* 32.7*  MCV 84.7   < > 85.6   < > 85.5 85.4 86.3 86.4 84.5  PLT 477*   < > 404*   < > 411* 406* 429* 409* 370   < > = values in this interval not displayed.   Basic Metabolic Panel: Recent Labs  Lab 10/27/19 0623 10/28/19 0439 10/28/19 1340 10/29/19 0531 10/30/19 0702 11/01/19 0456 11/01/19 1314 11/02/19 0613  NA  --  137  --  136 137 136  --  136  K  --  5.9*   < > 5.4* 5.0 5.9* 4.5 4.5  CL  --  95*  --  93* 97* 99  --  101  CO2  --  28  --  32 29 25  --  23  GLUCOSE  --  155*  --  141* 133* 134*  --  158*  BUN  --  141*  --  131* 124* 120*  --  111*  CREATININE  --  5.14*  --  5.77* 5.58* 5.64*  --  5.42*  CALCIUM  --  10.0  --  9.6 9.9 9.6  --  9.5  MG 2.4 2.5*  --  2.6*  --   --   --   --   PHOS 6.6* 5.8*  --  5.9* 5.6*  --   --   --    < > = values in this interval not displayed.   GFR: Estimated Creatinine Clearance: 21.8 mL/min (A) (by C-G formula based on SCr of 5.42 mg/dL (H)). Liver Function Tests: Recent Labs  Lab 10/28/19 0439 10/29/19 0531 10/30/19 0702  AST 36 29  --   ALT 16 20  --   ALKPHOS 106 103  --   BILITOT 0.6 0.1*  --   PROT 8.1 8.1  --   ALBUMIN 2.6* 2.6* 2.5*   No results for input(s): LIPASE, AMYLASE in the last 168 hours. No results for input(s): AMMONIA in the last 168 hours. Coagulation Profile: No results for input(s): INR, PROTIME in the last  168 hours. Cardiac Enzymes: No results for input(s): CKTOTAL, CKMB, CKMBINDEX, TROPONINI in the last 168 hours. BNP (last 3 results) No results for input(s): PROBNP in the last 8760 hours. HbA1C: No results for input(s): HGBA1C in the last 72 hours. CBG: Recent Labs  Lab 11/01/19 2038 11/02/19 0050 11/02/19 0431  11/02/19 0646 11/02/19 1102  GLUCAP 195* 206* 174* 138* 124*   Lipid Profile: No results for input(s): CHOL, HDL, LDLCALC, TRIG, CHOLHDL, LDLDIRECT in the last 72 hours. Thyroid Function Tests: No results for input(s): TSH, T4TOTAL, FREET4, T3FREE, THYROIDAB in the last 72 hours. Anemia Panel: No results for input(s): VITAMINB12, FOLATE, FERRITIN, TIBC, IRON, RETICCTPCT in the last 72 hours. Urine analysis:    Component Value Date/Time   COLORURINE YELLOW 08/15/2019 1538   APPEARANCEUR CLEAR 08/15/2019 1538   LABSPEC 1.010 08/15/2019 1538   PHURINE 5.0 08/15/2019 1538   GLUCOSEU NEGATIVE 08/15/2019 1538   HGBUR SMALL (A) 08/15/2019 1538   BILIRUBINUR NEGATIVE 08/15/2019 1538   KETONESUR NEGATIVE 08/15/2019 1538   PROTEINUR >=300 (A) 08/15/2019 1538   NITRITE NEGATIVE 08/15/2019 1538   LEUKOCYTESUR NEGATIVE 08/15/2019 1538   Sepsis Labs: @LABRCNTIP (procalcitonin:4,lacticidven:4)  )No results found for this or any previous visit (from the past 240 hour(s)).    Studies: No results found.  Scheduled Meds: . amLODipine  10 mg Oral Daily  . aspirin EC  81 mg Oral Daily  . collagenase   Topical Daily  . docusate sodium  100 mg Oral BID  . enoxaparin (LOVENOX) injection  30 mg Subcutaneous Q24H  . gabapentin  300 mg Oral Daily   And  . gabapentin  600 mg Oral QHS  . insulin aspart  0-15 Units Subcutaneous Q4H  . insulin aspart  7 Units Subcutaneous TID WC  . insulin glargine  20 Units Subcutaneous QHS  . multivitamin with minerals  1 tablet Oral Daily  . nutrition supplement (JUVEN)  1 packet Oral BID BM  . Ensure Max Protein  11 oz Oral BID  . sodium chloride flush  3 mL Intravenous Q12H  . sodium polystyrene  15 g Oral Daily    Continuous Infusions:    LOS: 16 days     Kayleen Memos, MD [facility] Pager [phone]  If 7PM-7AM, please contact night-coverage [email or web address] Password [email or web address] 11/02/2019, 1:01 PM

## 2019-11-02 NOTE — Progress Notes (Signed)
Physical Therapy Treatment Patient Details Name: Zachary Young MRN: [id number] DOB: [birth year] Today's Date: 11/02/2019    History of Present Illness Pt is a 59 y/o male s/p R transtibial amputation. PMH including but not limited to CHF, DM, HTN.    PT Comments    Pt OOB in recliner upon arrival of PT, agreeable to session with focus on progressing mobility and stability. The pt continues to demo improvements in cognition and cooperation with PT POC in this session. He was able to demo good stability with use of RW and hop-to pattern to ambulate in his room, but continues to benefit from minG for safety and cues for RW positioning. The pt would still be appropriate for SNF rehab, but could transition to home if family support and supervision for mobility could be arranged. The pt will continue to benefit from skilled PT to further improve functional mobility endurance and stability with both transitions and ambulation prior to d/c.     Follow Up Recommendations  SNF     Equipment Recommendations  Wheelchair (measurements PT);Wheelchair cushion (measurements PT);Other (comment) (with amputee leg rest on R side)    Recommendations for Other Services       Precautions / Restrictions Precautions Precautions: Fall Precaution Comments: R limb protector Required Braces or Orthoses: Other Brace Other Brace: R limb guard Restrictions Weight Bearing Restrictions: Yes RUE Weight Bearing: Weight bearing as tolerated RLE Weight Bearing: Non weight bearing    Mobility  Bed Mobility Overal bed mobility: Modified Independent             General bed mobility comments: increased time and effort with HOB elevated  Transfers Overall transfer level: Needs assistance Equipment used: Rolling walker (2 wheeled) Transfers: Sit to/from Stand Sit to Stand: Min guard         General transfer comment: pt able to rise without cues for hand placement from recliner and BSC in bathroom, very  impulsive lower to bed at end of session  Ambulation/Gait Ambulation/Gait assistance: Min assist Gait Distance (Feet): 12 Feet (+ 8) Assistive device: Rolling walker (2 wheeled) Gait Pattern/deviations: Step-to pattern;Antalgic;Trunk flexed Gait velocity: decreased Gait velocity interpretation: <1.31 ft/sec, indicative of household ambulator General Gait Details: hop-to pattern with cues for RW positioning/safety. Pt able to control in tight space in bathroom but has tendency to place too far out in front of himself   Stairs             Wheelchair Mobility    Modified Rankin (Stroke Patients Only)       Balance Overall balance assessment: Needs assistance Sitting-balance support: No upper extremity supported;Feet supported Sitting balance-Leahy Scale: Good     Standing balance support: Bilateral upper extremity supported   Standing balance comment: single UE during static stance, BUE with mobiltiy                            Cognition Arousal/Alertness: Awake/alert Behavior During Therapy: Agitated (easy to redirect and de-escalate)   Area of Impairment: Safety/judgement;Awareness;Problem solving                         Safety/Judgement: Decreased awareness of deficits;Decreased awareness of safety Awareness: Intellectual Problem Solving: Difficulty sequencing;Requires verbal cues General Comments: Pt more compliant this session, he gets agitated when asked to do things, but was easy to redirect and eventually complaint      Exercises      General  Comments        Pertinent Vitals/Pain Pain Assessment: Faces Faces Pain Scale: No hurt Pain Intervention(s): Monitored during session           PT Goals (current goals can now be found in the care plan section) Acute Rehab PT Goals Patient Stated Goal: to go home PT Goal Formulation: With patient Time For Goal Achievement: 11/05/19 Potential to Achieve Goals: Fair Progress towards  PT goals: Progressing toward goals    Frequency    Min 4X/week      PT Plan Current plan remains appropriate       AM-PAC PT "6 Clicks" Mobility   Outcome Measure  Help needed turning from your back to your side while in a flat bed without using bedrails?: None Help needed moving from lying on your back to sitting on the side of a flat bed without using bedrails?: None Help needed moving to and from a bed to a chair (including a wheelchair)?: A Little Help needed standing up from a chair using your arms (e.g., wheelchair or bedside chair)?: A Little Help needed to walk in hospital room?: A Little Help needed climbing 3-5 steps with a railing? : A Little 6 Click Score: 20    End of Session Equipment Utilized During Treatment: Gait belt Activity Tolerance: Treatment limited secondary to agitation Patient left: with call bell/phone within reach;in bed (sitting EOB) Nurse Communication: Mobility status PT Visit Diagnosis: Other abnormalities of gait and mobility (R26.89);Pain Pain - Right/Left: Right Pain - part of body: Shoulder     Time: 8350-7573 PT Time Calculation (min) (ACUTE ONLY): 18 min  Charges:  $Gait Training: 8-22 mins                     Zachary Young, PT, DPT   Acute Rehabilitation Department Pager #: [phone]   Otho Bellows 11/02/2019, 6:05 PM

## 2019-11-02 NOTE — Social Work (Signed)
Pt with complicated disposition: homeless/unsafe previous living situation, on parole with previous violations that make placement very difficult (cannot place at [facility], SNF, LTAC or in most public housing/shelter). Discussed having new GOC consult in light of pt waxing/waning mental status, nephrology consults and need for support with decision making.   Pt sisters have been noted by pt to be his preferred decision makers at this time. MD has placed repeat palliative consult for ongoing [place] conversation in light of pt ongoing medical work up.  Westley Hummer, MSW, [place] [facility]

## 2019-11-03 DIAGNOSIS — L02611 Cutaneous abscess of right foot: Secondary | ICD-10-CM | POA: Diagnosis not present

## 2019-11-03 LAB — GLUCOSE, CAPILLARY
Glucose-Capillary: 111 mg/dL — ABNORMAL HIGH (ref 70–99)
Glucose-Capillary: 120 mg/dL — ABNORMAL HIGH (ref 70–99)
Glucose-Capillary: 128 mg/dL — ABNORMAL HIGH (ref 70–99)
Glucose-Capillary: 226 mg/dL — ABNORMAL HIGH (ref 70–99)
Glucose-Capillary: 243 mg/dL — ABNORMAL HIGH (ref 70–99)

## 2019-11-03 MED ORDER — PROCHLORPERAZINE EDISYLATE 10 MG/2ML IJ SOLN
10.0000 mg | Freq: Every day | INTRAMUSCULAR | Status: DC
  Administered 2019-11-03 – 2019-12-11 (×34): 10 mg via INTRAVENOUS
  Filled 2019-11-03 (×35): qty 2

## 2019-11-03 MED ORDER — PROCHLORPERAZINE EDISYLATE 10 MG/2ML IJ SOLN
10.0000 mg | Freq: Four times a day (QID) | INTRAMUSCULAR | Status: DC | PRN
  Administered 2019-11-03 – 2019-11-14 (×3): 10 mg via INTRAVENOUS
  Filled 2019-11-03 (×5): qty 2

## 2019-11-03 MED ORDER — SODIUM POLYSTYRENE SULFONATE 15 GM/60ML PO SUSP
10.0000 g | Freq: Every day | ORAL | Status: DC
  Administered 2019-11-03 – 2019-11-06 (×3): 10 g via ORAL
  Filled 2019-11-03 (×7): qty 60

## 2019-11-03 NOTE — Progress Notes (Signed)
Chair alarm going off and when I entered the room and the patient was lying face forward on the bed but he had not fallen. He started yelling at me for having the chair alarm on and was yelling at me to turn it off. I assisted him to side of the bed and then NT and I helped him on the [facility]. He insisted we leave him by himself to try to use the bathroom and I instructed him that he had to call for help when ready to get back to bed. He would not let me stay in the room and told me to leave him alone.

## 2019-11-03 NOTE — Progress Notes (Signed)
Daily Progress Note   Patient Name: Zachary Young       Date: 11/03/2019 DOB: Sep 08, 1960  Age: 59 y.o. MRN#: [id number] Attending Physician: Kayleen Memos, DO Primary Care Physician: System, Pcp Not In Admit Date: 10/17/2019  Reason for Consultation/Follow-up: To discuss complex medical decision making related to patient's goals of care  Discussed with RN and Dr. Nevada Crane  Subjective: Minda Meo at bedside.  He tells me he is having pain in his leg at the sight of the amputation and it is there all the time.  He states he is having bowel movements.  Shortly after I arrive he tells me he has to vomit and grabs the trash can.  He vomits for about 10 minutes - dark fluid emesis.  RN brings Zofran.  He states he vomits every morning and every night and has been doing it for a long time.  He asks for nausea medication to be given after dinner.  I ask Harjot about HD, and explain that this vomiting may be caused by kidney failure.  I asked if HD was able to cure the vomiting would he be willing to do it.  He said absolutely not.  He had a brother who died on HD and will not do it.  Marcos wants to get out of the hospital.  States he has a place to stay in [place].  He was going to make some phone calls to his friends.   Legrand Como called [name] while I was there and asked her to meet with me.  [name] and I plan to meet at 1:00.  I spoke to Payson in person when she arrived to the hospital.  [name] has cared for Legrand Como for many years.  She states that he has the mental intelligence of about an 73-year-old.  We talked about his advanced kidney disease and the effect it was having on him.  I explained that he is coming to the crossroads where he will either decide to do dialysis or he will pass on.  He has  been having recurrent hyperkalemia that the medical team has been watching very closely and treating with Kayexalate regularly.  [name] explained that everyone in her family has diabetes and several people are on dialysis.  Javonnie's brother did have a cardiac arrest on dialysis.  Basilio refuses  to accept his poor health, vomiting and symptoms are coming from his kidneys.  [name] strongly wants to convince Paola to accept dialysis.  She plans to ask several family members to talk to him about taking dialysis.  She also mentioned that Tabitha has an excellent relationship with her nephrologist in [place] named Dr. Smith Mince.  She felt if Dr. Smith Mince called Legrand Como on the phone and talk to him about accepting dialysis he may be willing to do it.  I explained to [name] that Janos is not a good dialysis candidate for 2 reasons.  First dialysis can be very hard on the heart, and Bricyn already has cardiac issues.  Second Amer has a history of noncompliance and an order for hemodialysis to be effective there has to be patient compliance.  He would have to take dialysis 3 times a week.    I talked with [name] about Frederika.  [name] explained that they had had another family member who was full code.  They had to make the decision to take them off life support and it was very difficult.  [name] stated that Ezekiah would not want that.  She believes he does not want full code.  She explains that he does not understand medical terminology and refuses to ask questions when he does not understand something.  Per Eppie Gibson is DNR.  [name] has been in touch with [employer].  The parole officer feels that paperwork could be done in a court hearing could be held that would release Legrand Como from parole.  This would make him much easier to place in rehab or a skilled nursing facility.  [name] is actively working to get this done.    Assessment: Patient nauseated, very sleepy, having  episodes of vomiting.  Stage V CKD.  Unable to make his own medical decisions.   Patient Profile/HPI: 59 y.o. male  with past medical history of CKD V, poorly controlled T2DM, diabetic neuropathy, CHF, HTN, and substance abuse admitted on 10/17/2019 with syncopal episode. Found to have a new wound on plantar surface of right foot. Patient had R BKA on 7/7. PMT consulted for [place].   Length of Stay: 17   Vital Signs: BP (!) 106/54 (BP Location: Left Arm)   Pulse 79   Temp 98.4 F (36.9 C) (Oral)   Resp 16   Ht 6' 3" (1.905 m)   Wt 129 kg   SpO2 100%   BMI 35.55 kg/m  SpO2: SpO2: 100 % O2 Device: O2 Device: Room Air O2 Flow Rate: O2 Flow Rate (L/min): 2 L/min       Palliative Assessment/Data: 40 percent     Palliative Care Plan    Recommendations/Plan:  Changed CODE STATUS to DNR  Patient has the mental intelligence of an 47-year-old and cannot make complex medical decisions for himself.  Sister is attempting to encourage him to accept dialysis.  She would like for Dr. Smith Mince [facility] nephrologist) to be contacted in hopes that she could speak with the patient as they have a good relationship  [name] made aware of how critical [place] chronic kidney disease actually is, and that he is hospice eligible if he does not take dialysis.  Code Status:  DNR  Prognosis:  If he does not take dialysis and shows comfort care he would likely have days to weeks due to rapidly escalating potassium levels.  Discharge Planning:  To Be Determined  Care plan was discussed with Dr. Nevada Crane and family  Thank  you for allowing the Palliative Medicine Team to assist in the care of this patient.  Total time spent: 60 minutes     Greater than 50%  of this time was spent counseling and coordinating care related to the above assessment and plan.  Florentina Jenny, PA-C Palliative Medicine  Please contact Palliative MedicineTeam phone at [phone] for questions and concerns between 7 am -  7 pm.   Please see AMION for individual provider pager numbers.

## 2019-11-03 NOTE — Progress Notes (Signed)
Physical Therapy Treatment Patient Details Name: Zachary Young MRN: [id number] DOB: 25-Sep-1960 Today's Date: 11/03/2019    History of Present Illness Pt is a 59 y/o male s/p R transtibial amputation. PMH including but not limited to CHF, DM, HTN, L transmet amputation.    PT Comments    Pt irritated upon PT arrival, but agreeable to mobility due to need to use the bathroom. Pt follows very limited safety cues during mobility, and needs contact guard for safety but pt adamantly refused PT physically touching him. Pt ambulated 2x10 ft with use of RW, appeared effortful and pt using RLE residual limb for momentum during hop-to gait. PT attempted to encourage pt to perform TKA exercises to maintain strength and ROM, pt declines stating "I've been sick all morning, I am too tired". Given pt's safety awareness and mobility deficits, PT continuing to recommend SNF level of care post-acutely.    Follow Up Recommendations  SNF     Equipment Recommendations  Wheelchair (measurements PT);Wheelchair cushion (measurements PT);Other (comment) (with amputee leg rest on R side)    Recommendations for Other Services       Precautions / Restrictions Precautions Precautions: Fall Precaution Comments: R limb protector Required Braces or Orthoses: Other Brace Other Brace: R limb protector Restrictions Other Position/Activity Restrictions: NWB R LE    Mobility  Bed Mobility Overal bed mobility: Needs Assistance Bed Mobility: Supine to Sit           General bed mobility comments: pt sitting EOB upon PT arrival to room  Transfers Overall transfer level: Needs assistance Equipment used: Rolling walker (2 wheeled) Transfers: Sit to/from Stand Sit to Stand: Supervision         General transfer comment: supervision for safety, as pt states "don't help me" when rising. Verbal cuing for safe rise and lower; sit to stand x2 from EOB and from [facility] over toilet. Very unsafe rise from [facility], pt not  open to verbal cuing for safety.  Ambulation/Gait Ambulation/Gait assistance: Min guard Gait Distance (Feet): 10 Feet (+10) Assistive device: Rolling walker (2 wheeled) Gait Pattern/deviations: Step-to pattern;Antalgic;Trunk flexed Gait velocity: decr   General Gait Details: Min guard for safety, pt again reminding PT to not physically touch him stating "I have to figure out how to do this on my own". Pt with unsafe use of RW especially when changing floor surfaces i.e. hospital room vs hospital bathroom, and requires increased time to change directions in bathroom with poor placement within RW during. Pt using RLE residual limb momentum to help with hop-to gait.   Stairs             Wheelchair Mobility    Modified Rankin (Stroke Patients Only)       Balance Overall balance assessment: Needs assistance Sitting-balance support: No upper extremity supported;Feet supported Sitting balance-Leahy Scale: Good     Standing balance support: Bilateral upper extremity supported Standing balance-Leahy Scale: Poor Standing balance comment: reliant on external support, uses walls and toilet rails with SL support when rising and sitting from toilet                            Cognition Arousal/Alertness: Awake/alert Behavior During Therapy: Agitated Overall Cognitive Status: Impaired/Different from baseline Area of Impairment: Safety/judgement;Attention;Following commands;Problem solving                   Current Attention Level: Selective   Following Commands: Follows one step commands inconsistently;Follows one  step commands with increased time Safety/Judgement: Decreased awareness of safety;Decreased awareness of deficits   Problem Solving: Requires verbal cues General Comments: Pt irritated upon PT arrival, shaking head about having to participate in OOB mobility. Pt becomes agitated when PT confirmed name and date of birth, stating "I am tired of this shit",  calms quickly with redirection. Very poor safety awareness and not open to verbal or especially tactile cuing.      Exercises Other Exercises Other Exercises: Pt instructed in RLE LAQ, hip extension to perform later, pt declines performing BKA exercises at this time    General Comments        Pertinent Vitals/Pain Pain Assessment: Faces Faces Pain Scale: Hurts little more Pain Location: R residual limb Pain Descriptors / Indicators: Sore Pain Intervention(s): Limited activity within patient's tolerance;Monitored during session;Repositioned    Home Living                      Prior Function            PT Goals (current goals can now be found in the care plan section) Acute Rehab PT Goals Patient Stated Goal: to go home PT Goal Formulation: With patient Time For Goal Achievement: 11/17/19 Potential to Achieve Goals: Fair Progress towards PT goals: Progressing toward goals    Frequency    Min 3X/week      PT Plan Current plan remains appropriate    Co-evaluation              AM-PAC PT "6 Clicks" Mobility   Outcome Measure  Help needed turning from your back to your side while in a flat bed without using bedrails?: None Help needed moving from lying on your back to sitting on the side of a flat bed without using bedrails?: None Help needed moving to and from a bed to a chair (including a wheelchair)?: A Little Help needed standing up from a chair using your arms (e.g., wheelchair or bedside chair)?: A Little Help needed to walk in hospital room?: A Little Help needed climbing 3-5 steps with a railing? : A Little 6 Click Score: 20    End of Session Equipment Utilized During Treatment: Other (comment) (R limb protector) Activity Tolerance: Treatment limited secondary to agitation Patient left: with call bell/phone within reach;in bed (sitting EOB) Nurse Communication: Mobility status PT Visit Diagnosis: Other abnormalities of gait and mobility  (R26.89);Pain Pain - Right/Left: Right Pain - part of body: Shoulder     Time: 0177-9390 PT Time Calculation (min) (ACUTE ONLY): 14 min  Charges:  $Gait Training: 8-22 mins                    Kiondra Caicedo E, PT Acute Rehabilitation Services Pager [phone]  Office [phone]   Chrisoula Zegarra D [place] 11/03/2019, 1:37 PM

## 2019-11-03 NOTE — Progress Notes (Signed)
PROGRESS NOTE  Zachary Young QMV:[id number] DOB: [birth year] DOA: 10/17/2019 PCP: System, Pcp Not In  HPI/Recap of past 24 hours:  [name] PMHxCKD stage IV, DM type II controlled with complication, Diabetic Neuropathy, Chronic systolic and diastolic CHF, HTN. Presentedafter passing out. Zachary Young notes that he went out on to his front porch to get some fresh air. Once out there, he became dizzy and had to lean against the house, and then he passed out. He slid down the side of the house and did not hit his head. He noted no chest pain, no fever, no chills, no change in urination. He does note that he has had a decreased appetite for the last 3-4 days, eating only about half of what he normally does. He has passed out before in the past when his sugar drops, and he thinks this may have happened today. He denies chest pain. He has SOB with ambulating at times, but nothing today. He reports taking torsemide 80mg  in the morning and evening (4 tablets) and not missing doses. He was having issues with pain in bed, and as I looked at his legs, I noted that his right leg was swollen, warm and mildly erythematous compared to the left.  He noted a new wound on the plantar surface, at the 4th and 5th digit which he noticed first about 2 weeks ago.  Post right below the knee amputation on 10/21/2019 by Dr. Sharol Given.  Hospital course complicated by worsening renal function in the setting of CKD IV for which nephrology was consulted.  Patient has declined hemodialysis.  Has very poor insight into his medical condition.  His 2 sisters Zachary Young and Zachary Young are his medical decision makers.  Updated the patient's sister Ms. Zachary Young via phone.  Per his sister he is presently homeless.  He has difficulty understanding the severity of his medical condition.  Her along with her sister Zachary Young are making medical decisions for him.    11/03/19: Seen and examined at his bedside.  Reports nausea and vomiting this morning.  States  he wants to get out of here.  Palliative care team following to assist with establishing goals of care.   Assessment/Plan: Active Problems:   Hypertensive heart disease with CHF (congestive heart failure) (HCC)   Chronic kidney disease, stage 4 (severe) (HCC)   HLD (hyperlipidemia)   HTN (hypertension)   Systolic and diastolic CHF, chronic (HCC)   Tobacco use disorder   Type 2 diabetes mellitus with circulatory disorder, with long-term current use of insulin (HCC)   Wound of right foot   Syncope and collapse   Cocaine abuse ([place])   Subacute osteomyelitis, right ankle and foot (HCC)   Abscess of right foot   Diabetic neuropathy (HCC)   Anemia of chronic disease   Severe obesity (BMI 35.0-39.9) with comorbidity ([place])   Metabolic acidosis   DM (diabetes mellitus), secondary, uncontrolled, with complications (HCC)   Elevated sedimentation rate   Elevated C-reactive protein (CRP)   Hypoalbuminemia   Goals of care, counseling/discussion   Palliative care by specialist  Right foot wound status post BKA on 10/21/19 by Dr. Sharol Given Patient denies any pain currently.  Management per orthopedic surgery PT has recommended SNF, difficult placement.  Nonoliguric acute renal failure on chronic kidney disease stage IV, with hyperkalemia Followed by nephrology in [place]. Seen by nephrology on 7/8 here in this hospital. Patient has not been receptive of hemodialysis option. Presented with creatinine of 5.74. Creatinine downtrending 5.4 from 5.6 from  5.5 with GFR of 12 Continue to hold off diuretics and nephrotoxins Patient was seen by nephrology on July 8.  Renal US showing medical renal disease.  No obstructing focus in either kidney.   Continue Kayexalate 10 mg daily for hyperkalemia 1.4 L urine output recorded in the last 24 hours. Continue to monitor urine output Continue to monitor renal function and electrolytes.  Hyperkalemia in the setting of renal sufficiency Serum potassium  6.0, treated with IV calcium gluconate and Kayexalate 30 g once. Serum potassium 4.5 on 11/02/19  Intractable nausea and vomiting suspect secondary to uremia in the setting of advanced CKD BUN 111, creatinine 5.4 Patient has declined hemodialysis. Continue IV antiemetics as needed Palliative care team assisting with establishing goals of care.  Diabetes mellitus type 2, uncontrolled with hyperglycemia and renal complications, advanced CKD.   CBGs are poorly controlled.  HbA1c 8.8.  Continue Lantus 20 units nightly and NovoLog 7units tid with meals  Continue insulin sliding scale and avoid hypoglycemia  Syncope Likely multifactorial including overdiuresis and acute kidney injury and acute infection.  No further episodes in the hospital.  Continue to monitor  Chronic diastolic CHF EF 50 to 67% based on echocardiogram done in May. He has been diuresed aggressively perhaps to the point of becoming hypovolemic as discussed above.  Diuretics are now on hold. He has lost about 40 pounds or so in the last 2 months.  Euvolemic on exam. Not on diuretics due to poor renal function  Essential hypertension Blood pressures remained stable. On amlodipine 10 mg daily   Continue to monitor vital signs  Anemia of chronic kidney disease Hemoglobin is stable. No evidence of overt blood loss Hemoglobin stable 10.1  History of cocaine abuse His urine drug screen from 7/4 was positive for cocaine Not receptive to counseling  Obesity Estimated body mass index is 35.35 kg/m as calculated from the following: Height as of this encounter: 6\' 3"  (1.905 m). Weight as of this encounter: 128.3 kg.  Behavioral issues Patient has exhibited belligerent behavior with staff. He becomes verbally abusive. Appears to have very poor understanding of his multiple medical problems. He does not seem to understand the severity of his illness. Does not seem to understand the risks of  refusing treatment. This was also seen by the psychiatrist when they evaluated him on 7/9. However at the same time uremia could be contributing to alteration in his mentation. May need to reassess his mentation once his renal function and BUN is better.  Goals of care Seen by palliative care previously.  Palliative care team reconsulted to assist with establishing goals of care, appreciate assistance. His sisters Zachary Young and Zachary Young are his medical decision makers.   DVT Prophylaxis:Low-dose Lovenox subcu daily Code Status:Full code Family Communication:Updated his Sister Zachary Young via phone. Disposition Plan:Status is: Inpatient    Status is: Inpatient  Remains inpatient appropriate because:Inpatient level of care appropriate due to severity of illness   Dispo:             Patient From: Home             Planned Disposition: To be determined              Expected discharge date: 11/06/19             Medically stable for discharge: No  due to advanced renal failure and suspected uremia.       Objective: Vitals:   11/02/19 1742 11/03/19 0025 11/03/19 6195 11/03/19 1241  BP: 134/63 (!) 148/67 (!) 106/54 108/66  Pulse: 85 81 79 92  Resp: 16 18 16 18   Temp: 98.4 F (36.9 C) 97.6 F (36.4 C) 98.4 F (36.9 C) 98.3 F (36.8 C)  TempSrc:  Oral Oral Oral  SpO2: 98% 100% 100% 100%  Weight:      Height:        Intake/Output Summary (Last 24 hours) at 11/03/2019 1351 Last data filed at 11/03/2019 1300 Gross per 24 hour  Intake 1210 ml  Output 1400 ml  Net -190 ml   Filed Weights   10/30/19 0451 10/31/19 0445 11/02/19 0626  Weight: 128.3 kg 128.9 kg 129 kg    Exam:  . General: 59 y.o. year-old male obese in no acute distress.  Alert and oriented x3. . Cardiovascular: Regular rate and rhythm no rubs or gallops.   Marland Kitchen Respiratory: Bibasilar mild rales no wheezes noted.  Poor inspiratory effort.  . Abdomen: Obese nontender normal bowel sounds present.    . Musculoskeletal: Right below the knee amputation and surgical dressing  Trace edema in left lower extremity . Psychiatry: Mood is irritable.  Data Reviewed: CBC: Recent Labs  Lab 10/28/19 0439 10/28/19 0439 10/29/19 0531 10/30/19 0702 10/31/19 1040 11/01/19 0444 11/02/19 0613  WBC 7.2   < > 7.0 6.7 8.1 7.3 7.1  NEUTROABS 3.9  --  4.0  --   --   --   --   HGB 10.5*   < > 9.5* 9.4* 9.3* 10.3* 10.1*  HCT 34.5*   < > 31.9* 31.5* 31.5* 33.8* 32.7*  MCV 85.6   < > 85.5 85.4 86.3 86.4 84.5  PLT 404*   < > 411* 406* 429* 409* 370   < > = values in this interval not displayed.   Basic Metabolic Panel: Recent Labs  Lab 10/28/19 0439 10/28/19 1340 10/29/19 0531 10/30/19 0702 11/01/19 0456 11/01/19 1314 11/02/19 0613  NA 137  --  136 137 136  --  136  K 5.9*   < > 5.4* 5.0 5.9* 4.5 4.5  CL 95*  --  93* 97* 99  --  101  CO2 28  --  32 29 25  --  23  GLUCOSE 155*  --  141* 133* 134*  --  158*  BUN 141*  --  131* 124* 120*  --  111*  CREATININE 5.14*  --  5.77* 5.58* 5.64*  --  5.42*  CALCIUM 10.0  --  9.6 9.9 9.6  --  9.5  MG 2.5*  --  2.6*  --   --   --   --   PHOS 5.8*  --  5.9* 5.6*  --   --   --    < > = values in this interval not displayed.   GFR: Estimated Creatinine Clearance: 21.8 mL/min (A) (by C-G formula based on SCr of 5.42 mg/dL (H)). Liver Function Tests: Recent Labs  Lab 10/28/19 0439 10/29/19 0531 10/30/19 0702  AST 36 29  --   ALT 16 20  --   ALKPHOS 106 103  --   BILITOT 0.6 0.1*  --   PROT 8.1 8.1  --   ALBUMIN 2.6* 2.6* 2.5*   No results for input(s): LIPASE, AMYLASE in the last 168 hours. No results for input(s): AMMONIA in the last 168 hours. Coagulation Profile: No results for input(s): INR, PROTIME in the last 168 hours. Cardiac Enzymes: No results for input(s): CKTOTAL, CKMB, CKMBINDEX, TROPONINI in the last 168  hours. BNP (last 3 results) No results for input(s): PROBNP in the last 8760 hours. HbA1C: No results for input(s): HGBA1C  in the last 72 hours. CBG: Recent Labs  Lab 11/02/19 1612 11/02/19 2029 11/03/19 0024 11/03/19 0608 11/03/19 1245  GLUCAP 137* 169* 128* 120* 226*   Lipid Profile: No results for input(s): CHOL, HDL, LDLCALC, TRIG, CHOLHDL, LDLDIRECT in the last 72 hours. Thyroid Function Tests: No results for input(s): TSH, T4TOTAL, FREET4, T3FREE, THYROIDAB in the last 72 hours. Anemia Panel: No results for input(s): VITAMINB12, FOLATE, FERRITIN, TIBC, IRON, RETICCTPCT in the last 72 hours. Urine analysis:    Component Value Date/Time   COLORURINE YELLOW 08/15/2019 1538   APPEARANCEUR CLEAR 08/15/2019 1538   LABSPEC 1.010 08/15/2019 1538   PHURINE 5.0 08/15/2019 1538   GLUCOSEU NEGATIVE 08/15/2019 1538   HGBUR SMALL (A) 08/15/2019 1538   BILIRUBINUR NEGATIVE 08/15/2019 1538   KETONESUR NEGATIVE 08/15/2019 1538   PROTEINUR >=300 (A) 08/15/2019 1538   NITRITE NEGATIVE 08/15/2019 1538   LEUKOCYTESUR NEGATIVE 08/15/2019 1538   Sepsis Labs: @LABRCNTIP (procalcitonin:4,lacticidven:4)  )No results found for this or any previous visit (from the past 240 hour(s)).    Studies: No results found.  Scheduled Meds: . amLODipine  10 mg Oral Daily  . aspirin EC  81 mg Oral Daily  . collagenase   Topical Daily  . docusate sodium  100 mg Oral BID  . enoxaparin (LOVENOX) injection  30 mg Subcutaneous Q24H  . gabapentin  300 mg Oral Daily   And  . gabapentin  600 mg Oral QHS  . insulin aspart  0-15 Units Subcutaneous Q4H  . insulin aspart  7 Units Subcutaneous TID WC  . insulin glargine  20 Units Subcutaneous QHS  . multivitamin with minerals  1 tablet Oral Daily  . nutrition supplement (JUVEN)  1 packet Oral BID BM  . prochlorperazine  10 mg Intravenous QHS  . Ensure Max Protein  11 oz Oral BID  . sodium chloride flush  3 mL Intravenous Q12H  . sodium polystyrene  10 g Oral Daily    Continuous Infusions:    LOS: 17 days     Kayleen Memos, MD [facility] Pager  [phone]  If 7PM-7AM, please contact night-coverage [email or web address] Password [email or web address] 11/03/2019, 1:51 PM

## 2019-11-04 DIAGNOSIS — L02611 Cutaneous abscess of right foot: Secondary | ICD-10-CM | POA: Diagnosis not present

## 2019-11-04 LAB — CBC
HCT: 31.9 % — ABNORMAL LOW (ref 39.0–52.0)
Hemoglobin: 9.6 g/dL — ABNORMAL LOW (ref 13.0–17.0)
MCH: 25.7 pg — ABNORMAL LOW (ref 26.0–34.0)
MCHC: 30.1 g/dL (ref 30.0–36.0)
MCV: 85.3 fL (ref 80.0–100.0)
Platelets: 367 10*3/uL (ref 150–400)
RBC: 3.74 MIL/uL — ABNORMAL LOW (ref 4.22–5.81)
RDW: 15.3 % (ref 11.5–15.5)
WBC: 8.2 10*3/uL (ref 4.0–10.5)
nRBC: 0 % (ref 0.0–0.2)

## 2019-11-04 LAB — GLUCOSE, CAPILLARY
Glucose-Capillary: 210 mg/dL — ABNORMAL HIGH (ref 70–99)
Glucose-Capillary: 159 mg/dL — ABNORMAL HIGH (ref 70–99)
Glucose-Capillary: 191 mg/dL — ABNORMAL HIGH (ref 70–99)
Glucose-Capillary: 228 mg/dL — ABNORMAL HIGH (ref 70–99)
Glucose-Capillary: 151 mg/dL — ABNORMAL HIGH (ref 70–99)

## 2019-11-04 LAB — BASIC METABOLIC PANEL
Anion gap: 11 (ref 5–15)
BUN: 105 mg/dL — ABNORMAL HIGH (ref 6–20)
CO2: 20 mmol/L — ABNORMAL LOW (ref 22–32)
Calcium: 9.6 mg/dL (ref 8.9–10.3)
Chloride: 103 mmol/L (ref 98–111)
Creatinine, Ser: 5.98 mg/dL — ABNORMAL HIGH (ref 0.61–1.24)
GFR calc Af Amer: 11 mL/min — ABNORMAL LOW (ref >60)
GFR calc non Af Amer: 10 mL/min — ABNORMAL LOW (ref >60)
Glucose, Bld: 177 mg/dL — ABNORMAL HIGH (ref 70–99)
Potassium: 4.3 mmol/L (ref 3.5–5.1)
Sodium: 134 mmol/L — ABNORMAL LOW (ref 135–145)

## 2019-11-04 MED ORDER — AMLODIPINE BESYLATE 5 MG PO TABS
5.0000 mg | ORAL_TABLET | Freq: Every day | ORAL | Status: DC
  Administered 2019-11-04 – 2019-11-07 (×4): 5 mg via ORAL
  Filled 2019-11-04 (×4): qty 1

## 2019-11-04 NOTE — Progress Notes (Signed)
Orthopedic Tech Progress Note Patient Details:  Zachary Young November 13, 1960 [id number] Called in order to [employer] for a new SHRINKER. Soiled with blood Patient ID: Zachary Young, male   DOB: 17-Jan-1961, 59 y.o.   MRN: [id number]   Zachary Young 11/04/2019, 5:13 PM

## 2019-11-04 NOTE — Progress Notes (Signed)
Pt c/o that the stump was bleeding, " I went down on it earlier today when I was trying to make it to the damn bathroom". Pt was reminded that there is a bedside commode next his bed. "Yeah I know but I wanted to use the regular one". Writer did observe blood on sheets and began to check his stump. Once the stump protector was removed it was observed that inside of capsule had active blood in there as well as stocking was wet with blood. It was removed and rinsed out as well as the foam cushion at bottom. Dressing was removed as it was saturated with blood. Stump area was cleaned with NS and 39 staples were contacted as intact. 2 ABD pads placed and kerlix wrapped around stump and taped. A new foam will be necessary as the prior one is still wet. Monitoring continues.

## 2019-11-04 NOTE — Consult Note (Signed)
Zachary Young Admit Date: 10/17/2019 11/04/2019 Zachary Young Requesting Physician:  Zachary Young  Reason for Consult:  Ckd, dialysis HPI:  59 year old male with past medical history of CKD stage IV (following with Dr. Radene Knee at [facility] nephrology), diabetes type 2, systolic and diastolic CHF, hypertension who initially presented wound status post BKA on 10/21/2019.  He has been having AKI on CKD stage IV along with hyperkalemia.  He is having some uremic symptoms but was not agreeable to hemodialysis.  He was earlier receptive to dialysis today but is now changing his mind and wants to talk to another family member.  He does report nausea and vomiting but does not have any loss of appetite.  He also feels very dizzy especially when he stands up. Falling asleep during our discussion.  PMH Incudes: Past Medical History:  Diagnosis Date  . CHF (congestive heart failure) ([place])   . Diabetes mellitus without complication ([place])   . Peripheral edema   . Renal disorder    kidney disease       Creatinine, Ser (mg/dL)  Date Value  11/04/2019 5.98 (H)  11/02/2019 5.42 (H)  11/01/2019 5.64 (H)  10/30/2019 5.58 (H)  10/29/2019 5.77 (H)  10/28/2019 5.14 (H)  10/26/2019 4.94 (H)  10/25/2019 4.77 (H)  10/24/2019 4.86 (H)  10/23/2019 5.20 (H)  ] I/Os:  ROS Balance of 12 systems is negative w/ exceptions as above  PMH  Past Medical History:  Diagnosis Date  . CHF (congestive heart failure) ([place])   . Diabetes mellitus without complication ([name])   . Peripheral edema   . Renal disorder    kidney disease   PSH  Past Surgical History:  Procedure Laterality Date  . AMPUTATION Right 10/21/2019   Procedure: RIGHT BELOW KNEE AMPUTATION;  Surgeon: Newt Minion, MD;  Location: [facility];  Service: Orthopedics;  Laterality: Right;  . TOE AMPUTATION Bilateral    due to osteomyelitis, all 5 each foot   FH  Family History  Problem Relation Age of Onset  . Cancer Mother    SH  reports that he has been  smoking cigarettes. He has been smoking about 0.50 packs per day. He has never used smokeless tobacco. He reports previous alcohol use. He reports current drug use. Drug: Cocaine. Allergies  Allergies  Allergen Reactions  . Bee Venom Anaphylaxis    Per pt, nearly died from bee sting as a child  . Propofol Other (See Comments)     Hallucinations  . Eggs Or Egg-Derived Products Nausea And Vomiting and Other (See Comments)    Stomach cramps  . Morphine Nausea And Vomiting  . Oxycodone Nausea And Vomiting   Home medications Prior to Admission medications   Medication Sig Start Date End Date Taking? Authorizing Provider  albuterol (VENTOLIN HFA) 108 (90 Base) MCG/ACT inhaler Inhale 1 puff into the lungs every 4 (four) hours as needed for wheezing or shortness of breath. 08/20/19  Yes Gladys Damme, MD  amLODipine (NORVASC) 10 MG tablet Take 1 tablet (10 mg total) by mouth daily. 08/21/19  Yes Gladys Damme, MD  aspirin EC 81 MG EC tablet Take 1 tablet (81 mg total) by mouth daily. 08/21/19  Yes Gladys Damme, MD  ergocalciferol (VITAMIN D2) 1.25 MG (50000 UT) capsule Take 50,000 Units by mouth every Friday.  07/13/19  Yes [provider]  gabapentin (NEURONTIN) 300 MG capsule Take 300-600 mg by mouth See admin instructions. Take one capsule (300 mg) by mouth every morning and two capsules (600  mg) at night   Yes [provider]  insulin glargine (LANTUS) 100 UNIT/ML injection Inject 10 Units into the skin at bedtime.    Yes [provider]  insulin lispro (HUMALOG) 100 UNIT/ML injection Inject 4 Units into the skin 3 (three) times daily with meals.    Yes [provider]  oxyCODONE-acetaminophen (PERCOCET/ROXICET) 5-325 MG tablet Take by mouth every 6 (six) hours as needed for severe pain.   Yes [provider]  torsemide (DEMADEX) 20 MG tablet Take 4 tablets (80 mg total) by mouth 2 (two) times daily. Patient taking differently: Take 20 mg by mouth  daily.  08/20/19  Yes Gladys Damme, MD  Insulin Pen Needle (PEN NEEDLES 31GX5/16") 31G X 8 MM MISC 1 each by Other route daily. 05/16/19 05/15/20  [provider]  Insulin Syringe-Needle U-100 (GLOBAL INJECT EASE INSULIN SYR) 30G X 1/2" 0.5 ML MISC 1 each by Other route 4 (four) times daily as needed. Insulin 07/13/19   [provider]  metoprolol succinate (TOPROL-XL) 100 MG 24 hr tablet Take 100 mg by mouth daily. Take with or immediately following a meal. Patient not taking: Reported on 10/17/2019    [provider]  potassium chloride 20 MEQ TBCR Take 40 mEq by mouth daily. Patient not taking: Reported on 10/17/2019 08/20/19 09/19/19  Simmons-Robinson, Riki Sheer, MD    Current Medications Scheduled Meds: . amLODipine  5 mg Oral Daily  . aspirin EC  81 mg Oral Daily  . collagenase   Topical Daily  . docusate sodium  100 mg Oral BID  . enoxaparin (LOVENOX) injection  30 mg Subcutaneous Q24H  . gabapentin  300 mg Oral Daily   And  . gabapentin  600 mg Oral QHS  . insulin aspart  0-15 Units Subcutaneous Q4H  . insulin aspart  7 Units Subcutaneous TID WC  . insulin glargine  20 Units Subcutaneous QHS  . multivitamin with minerals  1 tablet Oral Daily  . nutrition supplement (JUVEN)  1 packet Oral BID BM  . prochlorperazine  10 mg Intravenous QHS  . Ensure Max Protein  11 oz Oral BID  . sodium chloride flush  3 mL Intravenous Q12H  . sodium polystyrene  10 g Oral Daily   Continuous Infusions: PRN Meds:.albuterol, HYDROmorphone (DILAUDID) injection, hydrOXYzine, ondansetron **OR** ondansetron (ZOFRAN) IV, oxyCODONE-acetaminophen, prochlorperazine  CBC Recent Labs  Lab 10/29/19 0531 10/30/19 0702 11/01/19 0444 11/02/19 0613 11/04/19 0959  WBC 7.0   < > 7.3 7.1 8.2  NEUTROABS 4.0  --   --   --   --   HGB 9.5*   < > 10.3* 10.1* 9.6*  HCT 31.9*   < > 33.8* 32.7* 31.9*  MCV 85.5   < > 86.4 84.5 85.3  PLT 411*   < > 409* 370 367   < > = values in this interval not  displayed.   Basic Metabolic Panel Recent Labs  Lab 10/29/19 0531 10/30/19 0702 11/01/19 0456 11/01/19 1314 11/02/19 0613 11/04/19 0513  NA 136 137 136  --  136 134*  K 5.4* 5.0 5.9* 4.5 4.5 4.3  CL 93* 97* 99  --  101 103  CO2 32 29 25  --  23 20*  GLUCOSE 141* 133* 134*  --  158* 177*  BUN 131* 124* 120*  --  111* 105*  CREATININE 5.77* 5.58* 5.64*  --  5.42* 5.98*  CALCIUM 9.6 9.9 9.6  --  9.5 9.6  PHOS 5.9* 5.6*  --   --   --   --  Physical Exam  Blood pressure 101/69, pulse 88, temperature 98.3 F (36.8 C), temperature source Oral, resp. rate 16, height 6\' 3"  (1.905 m), weight 130.2 kg, SpO2 100 %. GEN: wdwn, sitting in bed, nad ENT: no nasal discharge, mmm EYES: no scleral icterus, eomi CV: normal rate, no murmurs PULM: no iwob, bilateral chest rise ABD: NABS, non-distended SKIN: no rashes or jaundice EXT: trace edema Neuro: falling asleep during conversation, no asterixis, aaox3, speech clear and coherent   Assessment 1.  Progressive CKD likely progressed to stage V with some uremia as evidenced by nausea and vomiting with significant acidemia 2.  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  Diabetes mellitus type 2 4. cocaine use 5. Chronic diastolic and systolic heart failure 6. Chronic anemia 7. Homelessness  Plan 1. At this junction he still does not wish to do dialysis and wants to talk to another family member.  I explained to him the risks and benefits of dialysis and what the process entails including catheter placement and fistula placement. 2. We will continue to follow along until he has made a final decision 3. Renal diet, monitor Phos 4. Avoid nephrotoxic medications including NSAIDs and iodinated intravenous contrast exposure unless the latter is absolutely indicated.  Preferred narcotic agents for pain control are hydromorphone, fentanyl, and methadone. Morphine should not be used. Avoid Baclofen and avoid oral sodium  phosphate and magnesium citrate based laxatives / bowel preps. Continue strict Input and Output monitoring. Will monitor the patient closely with you and intervene or adjust therapy as indicated by changes in clinical status/labs   Plans communicated to the primary team   Zachary Quint, MD [place] 11/04/2019, 3:52 PM

## 2019-11-04 NOTE — Progress Notes (Addendum)
Nutrition Follow-up  DOCUMENTATION CODES:   Obesity unspecified  INTERVENTION:  Continue Ensure Max poBID, each supplement provides 150 kcal and 30 grams of protein.  ContinueJuven BID, each packet provides 95 calories, 2.5 grams of protein (collagen)for wound healing.   NUTRITION DIAGNOSIS:   Increased nutrient needs related to wound healing as evidenced by estimated needs; ongoing  GOAL:   Patient will meet greater than or equal to 90% of their needs; met  MONITOR:   PO intake, Supplement acceptance, Skin, Labs, Weight trends, I & O's  REASON FOR ASSESSMENT:   Consult Wound healing  ASSESSMENT:   Pt presented with wound of R foot and hyperglycemia. PMH significant for kidney disease, T2DM, CHF, HTN, diabetic neuropathy Procedure (7/7): Right BKA.   Meal completion has been 100%. Noted, pt with intractable nausea and vomiting suspect secondary to uremia in the setting of advanced CKD 4-5. Pt refusing dialysis. Goals of care discussion ongoing. Pt currently has Ensure and Juven ordered and has been consuming them. RD to continue with current orders to aid in healing.   Labs and medications reviewed.   Diet Order:   Diet Order            Diet renal/carb modified with fluid restriction Diet-HS Snack? Nothing; Fluid restriction: 1200 mL Fluid; Room service appropriate? Yes; Fluid consistency: Thin  Diet effective now                 EDUCATION NEEDS:   No education needs have been identified at this time  Skin:  Skin Assessment: Skin Integrity Issues: Skin Integrity Issues:: Incisions Wound Vac: N/A Diabetic Ulcer: R foot Incisions: R leg Other: amputation - all toes  Last BM:  7/20  Height:   Ht Readings from Last 1 Encounters:  10/21/19 6' 3"  (1.905 m)    Weight:   Wt Readings from Last 1 Encounters:  11/04/19 130.2 kg   BMI:  Body mass index is 35.88 kg/m.  Estimated Nutritional Needs:   Kcal:  2400-2600  Protein:  130-140 grams   Fluid:  >2L/d  Corrin Parker, MS, RD, LDN RD pager number/after hours weekend pager number on Amion.

## 2019-11-04 NOTE — Progress Notes (Signed)
Occupational Therapy Treatment Patient Details Name: Zachary Young MRN: [id number] DOB: 1961-02-11 Today's Date: 11/04/2019    History of present illness Pt is a 59 y/o male s/p R transtibial amputation. PMH including but not limited to CHF, DM, HTN, L transmet amputation.   OT comments  Pt reports he hates [facility] and wants to go to [facility]. [facility] would have already gotten him into a SNF. RN contacting [employer] for additional insert for limb protector.    Follow Up Recommendations  SNF;CIR;Other (comment)    Equipment Recommendations       Recommendations for Other Services PT consult    Precautions / Restrictions Precautions Precautions: Fall Precaution Comments: R limb protector Other Brace: R limb protector       Mobility Bed Mobility Overal bed mobility: Needs Assistance Bed Mobility: Rolling Rolling: Supervision            Transfers                      Balance                                           ADL either performed or assessed with clinical judgement   ADL Overall ADL's : Needs assistance/impaired Eating/Feeding: Modified independent;Bed level                                     General ADL Comments: pt demonstrates ability to show how limb protector goes together  and expresed need for new foam insert. RN formed on how to contact [employer] to get the insert. the insert is currently wet from being washed in the sink by staff. pt demonstrates knee extension , hip elevation and supervision bed mobility     Vision       Perception     Praxis      Cognition Arousal/Alertness: Awake/alert Behavior During Therapy: WFL for tasks assessed/performed Overall Cognitive Status: Impaired/Different from baseline                                          Exercises     Shoulder Instructions       General Comments dressing on wound so unable to educate on shrinker application due to  recent fall acutely    Pertinent Vitals/ Pain       Pain Assessment: Faces Faces Pain Scale: Hurts whole lot Pain Location: R residual limb Pain Descriptors / Indicators: Sore Pain Intervention(s): Monitored during session;Repositioned  Home Living                                          Prior Functioning/Environment              Frequency  Min 2X/week        Progress Toward Goals  OT Goals(current goals can now be found in the care plan section)  Progress towards OT goals: Progressing toward goals  Acute Rehab OT Goals Patient Stated Goal: to go home OT Goal Formulation: With patient Time For Goal Achievement: 11/05/19 Potential to Achieve Goals: Good ADL Goals Pt  Will Perform Lower Body Dressing: with modified independence;with adaptive equipment;sitting/lateral leans Pt Will Transfer to Toilet: stand pivot transfer;with min assist Pt Will Perform Tub/Shower Transfer: Tub transfer;tub bench;with min assist  Plan Discharge plan remains appropriate    Co-evaluation                 AM-PAC OT "6 Clicks" Daily Activity     Outcome Measure   Help from another person eating meals?: None Help from another person taking care of personal grooming?: A Little Help from another person toileting, which includes using toliet, bedpan, or urinal?: A Lot Help from another person bathing (including washing, rinsing, drying)?: A Lot Help from another person to put on and taking off regular upper body clothing?: A Little Help from another person to put on and taking off regular lower body clothing?: A Lot 6 Click Score: 16    End of Session    OT Visit Diagnosis: Other abnormalities of gait and mobility (R26.89);Unsteadiness on feet (R26.81);Muscle weakness (generalized) (M62.81);Other symptoms and signs involving cognitive function;Pain Pain - Right/Left: Right Pain - part of body: Leg   Activity Tolerance Other (comment) (within pt tolerance)    Patient Left in bed;with call bell/phone within reach   Nurse Communication Mobility status;Precautions        Time: 6962-9528 OT Time Calculation (min): 8 min  Charges: OT General Charges $OT Visit: 1 Visit OT Treatments $Self Care/Home Management : 8-22 mins   Brynn, OTR/L  Acute Rehabilitation Services Pager: [phone] Office: [phone] .    Jeri Modena 11/04/2019, 4:25 PM

## 2019-11-04 NOTE — Progress Notes (Signed)
PROGRESS NOTE  Zachary Young SHF:[id number] DOB: Jul 01, 1960 DOA: 10/17/2019 PCP: System, Pcp Not In  HPI/Recap of past 24 hours:  [name] PMHxCKD stage IV, DM type II controlled with complication, Diabetic Neuropathy, Chronic systolic and diastolic CHF, HTN. Presentedafter passing out. Zachary Young notes that he went out on to his front porch to get some fresh air. Once out there, he became dizzy and had to lean against the house, and then he passed out. He slid down the side of the house and did not hit his head. He noted no chest pain, no fever, no chills, no change in urination. He does note that he has had a decreased appetite for the last 3-4 days, eating only about half of what he normally does. He has passed out before in the past when his sugar drops, and he thinks this may have happened today. He denies chest pain. He has SOB with ambulating at times, but nothing today. He reports taking torsemide 80mg  in the morning and evening (4 tablets) and not missing doses. He was having issues with pain in bed, and as I looked at his legs, I noted that his right leg was swollen, warm and mildly erythematous compared to the left.  He noted a new wound on the plantar surface, at the 4th and 5th digit which he noticed first about 2 weeks ago.  Post right below the knee amputation on 10/21/2019 by Dr. Sharol Given.  Hospital course complicated by worsening renal function in the setting of CKD IV for which nephrology was consulted.  Patient has declined hemodialysis.  Has very poor insight into his medical condition.  His 2 sisters [name] and [name] are his medical decision makers.  Updated the patient's sister Ms. [name] via phone.  Per his sister he is presently homeless.  He has difficulty understanding the severity of his medical condition.  Her along with her sister [name] are making medical decisions for him.    11/04/19: Seen and examined at his bedside.  Reports nausea and vomiting.  He is agreeable to  hemodialysis.  Nephrology made aware.    Assessment/Plan: Active Problems:   Hypertensive heart disease with CHF (congestive heart failure) (HCC)   Chronic kidney disease, stage 4 (severe) (HCC)   HLD (hyperlipidemia)   HTN (hypertension)   Systolic and diastolic CHF, chronic (HCC)   Tobacco use disorder   Type 2 diabetes mellitus with circulatory disorder, with long-term current use of insulin (HCC)   Wound of right foot   Syncope and collapse   Cocaine abuse ([place])   Subacute osteomyelitis, right ankle and foot (HCC)   Abscess of right foot   Diabetic neuropathy (HCC)   Anemia of chronic disease   Severe obesity (BMI 35.0-39.9) with comorbidity ([place])   Metabolic acidosis   DM (diabetes mellitus), secondary, uncontrolled, with complications (HCC)   Elevated sedimentation rate   Elevated C-reactive protein (CRP)   Hypoalbuminemia   Goals of care, counseling/discussion   Palliative care by specialist  Right foot wound status post BKA on 10/21/19 by Dr. Sharol Given Patient denies any pain currently.  Management per orthopedic surgery PT has recommended SNF, difficult placement.  Nonoliguric acute renal failure on chronic kidney disease stage IV, with hyperkalemia Followed by nephrology in [place]. Seen by nephrology on 7/8 here in this hospital. Patient was previously not receptive of hemodialysis option. Patient is now agreeable to hemodialysis Nephrology will see in consultation, appreciate assistance. Creatinine is uptrending 5.9  BUN 105 from 111 Continue  to avoid nephrotoxins and hypotension Renal US showing medical renal disease.  No obstructing focus in either kidney.  Serum potassium level stable  Continue Kayexalate 10 mg daily to avoid hyperkalemia I's and O's are not accurate Continue to monitor renal function and electrolytes.  Resolved post treatment: Hyperkalemia in the setting of acute on chronic renal insufficiency Serum potassium 6.0, treated with IV calcium  gluconate and Kayexalate 30 g once. Serum potassium 4.3 on 11/04/2019  Intractable nausea and vomiting suspect secondary to uremia in the setting of advanced CKD BUN 111, creatinine 5.4> BUN 105, creatinine 5.9 Patient is now agreeable to hemodialysis Nephrology will see in consultation, appreciate assistance. Continue IV antiemetics as needed Palliative care team assisting with establishing goals of care.  Diabetes mellitus type 2, uncontrolled with hyperglycemia and renal complications, advanced CKD.   CBGs are poorly controlled.  HbA1c 8.8.  Continue Lantus 20 units nightly and NovoLog 7units tid with meals  Continue insulin sliding scale and avoid hypoglycemia  Syncope, no recurrence Likely multifactorial including overdiuresis and acute kidney injury and acute infection.  No further episodes in the hospital.  Continue to monitor  Chronic diastolic CHF EF 50 to 09% based on echocardiogram done in May. He has been diuresed aggressively perhaps to the point of becoming hypovolemic as discussed above.  Diuretics are now on hold. He has lost about 40 pounds or so in the last 2 months.  Euvolemic on exam. Not on diuretics due to poor renal function  Essential hypertension Blood pressures remained stable. On amlodipine 10 mg daily   Continue to monitor vital signs  Anemia of chronic kidney disease/acute blood loss-bled from his stump, resolved. Hemoglobin is 9.6 from 10.1  History of cocaine abuse His urine drug screen from 7/4 was positive for cocaine Not receptive to counseling  Obesity Estimated body mass index is 35.35 kg/m as calculated from the following: Height as of this encounter: 6\' 3"  (1.905 m). Weight as of this encounter: 128.3 kg.  Behavioral issues Patient has exhibited belligerent behavior with staff. He becomes verbally abusive. Appears to have very poor understanding of his multiple medical problems.  His sisters [name] and [name] are  his medical decision makers.  Goals of care Seen by palliative care previously.  Palliative care team reconsulted to assist with establishing goals of care, appreciate assistance. His sisters [name] and [name] are his medical decision makers.   DVT Prophylaxis:Low-dose Lovenox subcu daily Code Status:Full code Family Communication:Updated his Sister [name] via phone. Disposition Plan:Status is: Inpatient    Status is: Inpatient  Remains inpatient appropriate because:Inpatient level of care appropriate due to severity of illness   Dispo:             Patient From: Home             Planned Disposition: To be determined              Expected discharge date: 11/06/19             Medically stable for discharge: No  due to advanced renal failure and possible uremia.       Objective: Vitals:   11/03/19 1849 11/03/19 2318 11/04/19 0425 11/04/19 1220  BP: 112/70 107/66 115/86 101/69  Pulse: 89 88 80 88  Resp: 18 16 16 16   Temp: 98.9 F (37.2 C) 98.4 F (36.9 C) 98.9 F (37.2 C) 98.3 F (36.8 C)  TempSrc: Oral Oral Oral Oral  SpO2: 100% 98% 97% 100%  Weight:  130.2  kg 130.2 kg   Height:       No intake or output data in the 24 hours ending 11/04/19 1402 Filed Weights   11/02/19 0626 11/03/19 2318 11/04/19 0425  Weight: 129 kg 130.2 kg 130.2 kg    Exam:  . General: 59 y.o. year-old male obese in no acute distress.  Alert and oriented x3.  . Cardiovascular: Regular rate and rhythm no rubs or gallops.   Marland Kitchen Respiratory: Clear to auscultation no wheezes no rales.  Poor inspiratory effort. . Abdomen: Obese nontender normal bowel sounds present.   . Musculoskeletal: Right below the knee amputation in surgical dressing.  Trace edema in left lower extremity . Psychiatry: Mood today is appropriate  Data Reviewed: CBC: Recent Labs  Lab 10/29/19 0531 10/29/19 0531 10/30/19 0702 10/31/19 1040 11/01/19 0444 11/02/19 0613 11/04/19 0959  WBC 7.0   < > 6.7 8.1  7.3 7.1 8.2  NEUTROABS 4.0  --   --   --   --   --   --   HGB 9.5*   < > 9.4* 9.3* 10.3* 10.1* 9.6*  HCT 31.9*   < > 31.5* 31.5* 33.8* 32.7* 31.9*  MCV 85.5   < > 85.4 86.3 86.4 84.5 85.3  PLT 411*   < > 406* 429* 409* 370 367   < > = values in this interval not displayed.   Basic Metabolic Panel: Recent Labs  Lab 10/29/19 0531 10/29/19 0531 10/30/19 0702 11/01/19 0456 11/01/19 1314 11/02/19 0613 11/04/19 0513  NA 136  --  137 136  --  136 134*  K 5.4*   < > 5.0 5.9* 4.5 4.5 4.3  CL 93*  --  97* 99  --  101 103  CO2 32  --  29 25  --  23 20*  GLUCOSE 141*  --  133* 134*  --  158* 177*  BUN 131*  --  124* 120*  --  111* 105*  CREATININE 5.77*  --  5.58* 5.64*  --  5.42* 5.98*  CALCIUM 9.6  --  9.9 9.6  --  9.5 9.6  MG 2.6*  --   --   --   --   --   --   PHOS 5.9*  --  5.6*  --   --   --   --    < > = values in this interval not displayed.   GFR: Estimated Creatinine Clearance: 19.8 mL/min (A) (by C-G formula based on SCr of 5.98 mg/dL (H)). Liver Function Tests: Recent Labs  Lab 10/29/19 0531 10/30/19 0702  AST 29  --   ALT 20  --   ALKPHOS 103  --   BILITOT 0.1*  --   PROT 8.1  --   ALBUMIN 2.6* 2.5*   No results for input(s): LIPASE, AMYLASE in the last 168 hours. No results for input(s): AMMONIA in the last 168 hours. Coagulation Profile: No results for input(s): INR, PROTIME in the last 168 hours. Cardiac Enzymes: No results for input(s): CKTOTAL, CKMB, CKMBINDEX, TROPONINI in the last 168 hours. BNP (last 3 results) No results for input(s): PROBNP in the last 8760 hours. HbA1C: No results for input(s): HGBA1C in the last 72 hours. CBG: Recent Labs  Lab 11/03/19 1642 11/03/19 2027 11/04/19 0045 11/04/19 0539 11/04/19 1058  GLUCAP 111* 243* 210* 159* 191*   Lipid Profile: No results for input(s): CHOL, HDL, LDLCALC, TRIG, CHOLHDL, LDLDIRECT in the last 72 hours. Thyroid Function Tests: No results  for input(s): TSH, T4TOTAL, FREET4, T3FREE,  THYROIDAB in the last 72 hours. Anemia Panel: No results for input(s): VITAMINB12, FOLATE, FERRITIN, TIBC, IRON, RETICCTPCT in the last 72 hours. Urine analysis:    Component Value Date/Time   COLORURINE YELLOW 08/15/2019 1538   APPEARANCEUR CLEAR 08/15/2019 1538   LABSPEC 1.010 08/15/2019 1538   PHURINE 5.0 08/15/2019 1538   GLUCOSEU NEGATIVE 08/15/2019 1538   HGBUR SMALL (A) 08/15/2019 1538   BILIRUBINUR NEGATIVE 08/15/2019 1538   KETONESUR NEGATIVE 08/15/2019 1538   PROTEINUR >=300 (A) 08/15/2019 1538   NITRITE NEGATIVE 08/15/2019 1538   LEUKOCYTESUR NEGATIVE 08/15/2019 1538   Sepsis Labs: @LABRCNTIP (procalcitonin:4,lacticidven:4)  )No results found for this or any previous visit (from the past 240 hour(s)).    Studies: No results found.  Scheduled Meds: . amLODipine  5 mg Oral Daily  . aspirin EC  81 mg Oral Daily  . collagenase   Topical Daily  . docusate sodium  100 mg Oral BID  . enoxaparin (LOVENOX) injection  30 mg Subcutaneous Q24H  . gabapentin  300 mg Oral Daily   And  . gabapentin  600 mg Oral QHS  . insulin aspart  0-15 Units Subcutaneous Q4H  . insulin aspart  7 Units Subcutaneous TID WC  . insulin glargine  20 Units Subcutaneous QHS  . multivitamin with minerals  1 tablet Oral Daily  . nutrition supplement (JUVEN)  1 packet Oral BID BM  . prochlorperazine  10 mg Intravenous QHS  . Ensure Max Protein  11 oz Oral BID  . sodium chloride flush  3 mL Intravenous Q12H  . sodium polystyrene  10 g Oral Daily    Continuous Infusions:    LOS: 18 days     Kayleen Memos, MD [facility] Pager [phone]  If 7PM-7AM, please contact night-coverage [email or web address] Password [facility] 11/04/2019, 2:02 PM

## 2019-11-04 NOTE — TOC Progression Note (Addendum)
Transition of Care [facility]) - Progression Note    Patient Details  Name: Zachary Young MRN: [id number] Date of Birth: 07/01/1960  Transition of Care [facility]) CM/SW [place], [place] Phone Number: 11/04/2019, 10:49 AM  Clinical Narrative:    5:12pm- Note OT comments in eval, for care team awareness pt has been denied by [facility]   10:49am- Noted ongoing medical w/u with nephrology and [place] discussions with palliative.  Even if pt does get off parole there are several other extenuating reasons why pt would not be able to be placed in SNF.   TOC team continues to follow.   Expected Discharge Plan and Services Discharge Planning Services: CM Consult Living arrangements for the past 2 months: Single Family Home  Readmission Risk Interventions No flowsheet data found.

## 2019-11-05 ENCOUNTER — Inpatient Hospital Stay (HOSPITAL_COMMUNITY): Payer: Medicaid Other

## 2019-11-05 DIAGNOSIS — L02611 Cutaneous abscess of right foot: Secondary | ICD-10-CM | POA: Diagnosis not present

## 2019-11-05 HISTORY — PX: IR US GUIDE VASC ACCESS RIGHT: IMG2390

## 2019-11-05 HISTORY — PX: IR FLUORO GUIDE CV LINE RIGHT: IMG2283

## 2019-11-05 LAB — GLUCOSE, CAPILLARY
Glucose-Capillary: 113 mg/dL — ABNORMAL HIGH (ref 70–99)
Glucose-Capillary: 154 mg/dL — ABNORMAL HIGH (ref 70–99)
Glucose-Capillary: 72 mg/dL (ref 70–99)
Glucose-Capillary: 162 mg/dL — ABNORMAL HIGH (ref 70–99)
Glucose-Capillary: 76 mg/dL (ref 70–99)
Glucose-Capillary: 184 mg/dL — ABNORMAL HIGH (ref 70–99)

## 2019-11-05 LAB — CBC
HCT: 32.7 % — ABNORMAL LOW (ref 39.0–52.0)
Hemoglobin: 9.8 g/dL — ABNORMAL LOW (ref 13.0–17.0)
MCH: 25.5 pg — ABNORMAL LOW (ref 26.0–34.0)
MCHC: 30 g/dL (ref 30.0–36.0)
MCV: 84.9 fL (ref 80.0–100.0)
Platelets: 375 10*3/uL (ref 150–400)
RBC: 3.85 MIL/uL — ABNORMAL LOW (ref 4.22–5.81)
RDW: 15.4 % (ref 11.5–15.5)
WBC: 8.1 10*3/uL (ref 4.0–10.5)
nRBC: 0 % (ref 0.0–0.2)

## 2019-11-05 LAB — BASIC METABOLIC PANEL
Anion gap: 13 (ref 5–15)
BUN: 100 mg/dL — ABNORMAL HIGH (ref 6–20)
CO2: 19 mmol/L — ABNORMAL LOW (ref 22–32)
Calcium: 9.2 mg/dL (ref 8.9–10.3)
Chloride: 104 mmol/L (ref 98–111)
Creatinine, Ser: 6.04 mg/dL — ABNORMAL HIGH (ref 0.61–1.24)
GFR calc Af Amer: 11 mL/min — ABNORMAL LOW (ref >60)
GFR calc non Af Amer: 9 mL/min — ABNORMAL LOW (ref >60)
Glucose, Bld: 81 mg/dL (ref 70–99)
Potassium: 4.1 mmol/L (ref 3.5–5.1)
Sodium: 136 mmol/L (ref 135–145)

## 2019-11-05 LAB — FOLATE: Folate: 13.3 ng/mL (ref >5.9)

## 2019-11-05 LAB — PROTIME-INR
INR: 1 (ref 0.8–1.2)
Prothrombin Time: 12.9 seconds (ref 11.4–15.2)

## 2019-11-05 MED ORDER — CEFAZOLIN SODIUM-DEXTROSE 2-4 GM/100ML-% IV SOLN
INTRAVENOUS | Status: AC
  Administered 2019-11-05: 2 g via INTRAVENOUS
  Filled 2019-11-05: qty 100

## 2019-11-05 MED ORDER — PENTAFLUOROPROP-TETRAFLUOROETH EX AERO: 1.0000 "application " | INHALATION_SPRAY | CUTANEOUS | Status: DC | PRN

## 2019-11-05 MED ORDER — GELATIN ABSORBABLE 12-7 MM EX MISC
CUTANEOUS | Status: AC
  Filled 2019-11-05: qty 1

## 2019-11-05 MED ORDER — LIDOCAINE-PRILOCAINE 2.5-2.5 % EX CREA: 1.0000 "application " | TOPICAL_CREAM | CUTANEOUS | Status: DC | PRN

## 2019-11-05 MED ORDER — SODIUM CHLORIDE 0.9 % IV SOLN: 100.0000 mL | INTRAVENOUS | Status: DC | PRN

## 2019-11-05 MED ORDER — LIDOCAINE HCL 1 % IJ SOLN
INTRAMUSCULAR | Status: AC | PRN
  Administered 2019-11-05 (×2): 5 mL

## 2019-11-05 MED ORDER — CHLORHEXIDINE GLUCONATE CLOTH 2 % EX PADS
6.0000 | MEDICATED_PAD | Freq: Every day | CUTANEOUS | Status: DC
  Administered 2019-11-06 – 2019-11-25 (×14): 6 via TOPICAL

## 2019-11-05 MED ORDER — HEPARIN SODIUM (PORCINE) 1000 UNIT/ML DIALYSIS: 1000.0000 [IU] | INTRAMUSCULAR | Status: DC | PRN

## 2019-11-05 MED ORDER — SODIUM BICARBONATE 650 MG PO TABS
650.0000 mg | ORAL_TABLET | Freq: Two times a day (BID) | ORAL | Status: DC
  Administered 2019-11-05 – 2019-11-06 (×2): 650 mg via ORAL
  Filled 2019-11-05 (×3): qty 1

## 2019-11-05 MED ORDER — LIDOCAINE HCL 1 % IJ SOLN
INTRAMUSCULAR | Status: AC
  Filled 2019-11-05: qty 20

## 2019-11-05 MED ORDER — HEPARIN SODIUM (PORCINE) 1000 UNIT/ML IJ SOLN
INTRAMUSCULAR | Status: AC
  Filled 2019-11-05: qty 1

## 2019-11-05 MED ORDER — LIDOCAINE HCL (PF) 1 % IJ SOLN: 5.0000 mL | INTRAMUSCULAR | Status: DC | PRN

## 2019-11-05 MED ORDER — MIDAZOLAM HCL 2 MG/2ML IJ SOLN
INTRAMUSCULAR | Status: AC | PRN
  Administered 2019-11-05: 0.5 mg via INTRAVENOUS

## 2019-11-05 MED ORDER — CEFAZOLIN SODIUM-DEXTROSE 2-4 GM/100ML-% IV SOLN: 2.0000 g | INTRAVENOUS | Status: AC

## 2019-11-05 MED ORDER — GELATIN ABSORBABLE 12-7 MM EX MISC
CUTANEOUS | Status: AC | PRN
  Administered 2019-11-05: 1 via TOPICAL

## 2019-11-05 MED ORDER — FENTANYL CITRATE (PF) 100 MCG/2ML IJ SOLN
INTRAMUSCULAR | Status: AC
  Filled 2019-11-05: qty 2

## 2019-11-05 MED ORDER — ALTEPLASE 2 MG IJ SOLR: 2.0000 mg | Freq: Once | INTRAMUSCULAR | Status: DC | PRN

## 2019-11-05 MED ORDER — MIDAZOLAM HCL 2 MG/2ML IJ SOLN
INTRAMUSCULAR | Status: AC
  Filled 2019-11-05: qty 2

## 2019-11-05 MED ORDER — HEPARIN SODIUM (PORCINE) 1000 UNIT/ML IJ SOLN
INTRAMUSCULAR | Status: AC | PRN
  Administered 2019-11-05: 3.2 mL via INTRAVENOUS

## 2019-11-05 NOTE — Progress Notes (Signed)
Renal Navigator received notification from Dr. Singh/Nephrologist that patient has agreed to initiate HD and needs OP HD referral for ESRD. Navigator met with patient to discuss referral. He reports, "I don't want to do dialysis, but my family talked me in to it." He states he feels "fine" about this. Navigator encouraged him to continue the discussion with his family, adding that if he starts HD and he feels it does not support quality of life, it can be stopped at any time. Navigator acknowledged that these are tough discussions around tough decisions. Patient did not have much response. He just wanted to know when it will happen [facility] and first tx), which Navigator was unable to answer for him. Navigator asked him his plan for discharge and he responded that he will be going to rehab. He states "[facility] as patient is currently documented as "homeless," and an OP HD clinic can not be arranged until Navigator knows where patient will be going at discharge. Navigator will follow closely.  Alphonzo Cruise, [place] Renal Navigator [phone]

## 2019-11-05 NOTE — Progress Notes (Signed)
Patient in IR for this yellow MEWs.

## 2019-11-05 NOTE — Progress Notes (Signed)
PROGRESS NOTE  Zachary Young VZD:[id number] DOB: Dec 07, 1960 DOA: 10/17/2019 PCP: System, Pcp Not In  HPI/Recap of past 24 hours:  [name] PMHxCKD stage IV, type II diabetes with hyperglycemia, Diabetic Neuropathy, Chronic systolic and diastolic CHF, HTN. Presentedafter passing out. Zachary Young notes that he went out on to his front porch to get some fresh air. Once out there, he became dizzy and had to lean against the house, and then he passed out. He slid down the side of the house and did not hit his head. He noted no chest pain, no fever, no chills, no change in urination. He does note that he has had a decreased appetite for the last 3-4 days, eating only about half of what he normally does. He has passed out before in the past when his sugar drops, and he thinks this may have happened today. He denies chest pain. He has SOB with ambulation at times. He reports taking torsemide 80mg  in the morning and evening (4 tablets) and not missing doses. He was having issues with pain in bed, and as I looked at his legs, I noted that his right leg was swollen, warm and mildly erythematous compared to the left.  He noted a new wound on the plantar surface, at the 4th and 5th digit which he noticed first about 2 weeks ago.    Post right below the knee amputation on 10/21/2019 by Dr. Sharol Given.  Hospital course complicated by worsening renal function in the setting of CKD IV for which nephrology was consulted.  Has very poor insight into his medical condition.  His 2 sisters Zachary Young and Zachary Young are his medical decision makers.  Updated the patient's sister Ms. Zachary Young via phone.  Per his sister he is presently homeless.  He has difficulty understanding the severity of his medical condition.  Her along with her sister Zachary Young are making medical decisions for him.    Patient is on parole and his sister Zachary Young is working to lift it up in order to facilitate his placement to SNF.  11/05/19: Seen and examined at  his bedside.  Reports nausea and vomiting.  He is agreeable to hemodialysis.  Nephrology following. IR consulted for [facility] placement.  Assessment/Plan: Active Problems:   Hypertensive heart disease with CHF (congestive heart failure) (HCC)   Chronic kidney disease, stage 4 (severe) (HCC)   HLD (hyperlipidemia)   HTN (hypertension)   Systolic and diastolic CHF, chronic (HCC)   Tobacco use disorder   Type 2 diabetes mellitus with circulatory disorder, with long-term current use of insulin (HCC)   Wound of right foot   Syncope and collapse   Cocaine abuse ([place])   Subacute osteomyelitis, right ankle and foot (HCC)   Abscess of right foot   Diabetic neuropathy (HCC)   Anemia of chronic disease   Severe obesity (BMI 35.0-39.9) with comorbidity ([place])   Metabolic acidosis   DM (diabetes mellitus), secondary, uncontrolled, with complications (HCC)   Elevated sedimentation rate   Elevated C-reactive protein (CRP)   Hypoalbuminemia   Goals of care, counseling/discussion   Palliative care by specialist  Right foot wound status post BKA on 10/21/19 by Dr. Sharol Given Patient denies any pain currently.  Patient reports he fell on the residual limb and had some bleeding Seen by ortho on 7/22, patient denies any wound dehiscence Ortho recommends to hold SQ lovenox until the hematoma has resolved.  Will follow up as an outpatient. PT has recommended SNF, difficult placement.  Nonoliguric acute renal  failure on chronic kidney disease stage IV, with hyperkalemia Followed by nephrology in [place].  Worsening renal failure, creatinine uptrending, 6.04 from 5.98 Need strict I's and O's, no urine recorded Seen by nephrology, agreeable to HD IR consulted for [facility] placement Renal US 7/15 showing medical renal disease with no obstructing focus in either kidney.  Continue Kayexalate 10 mg daily to avoid hyperkalemia Continue to monitor renal function and electrolytes.  Resolved post treatment:  Hyperkalemia in the setting of acute on chronic renal insufficiency Serum potassium 6.0, treated with IV calcium gluconate and Kayexalate 30 g once. Serum potassium 4.1 on 11/04/2019  Intractable nausea and vomiting suspect secondary to uremia in the setting of advanced CKD BUN 111, creatinine 5.4> BUN 105, creatinine 5.9> 6.0 Patient is now agreeable to hemodialysis Nephrology following Continue IV antiemetics as needed Palliative care team assisting with establishing goals of care.  Diabetes mellitus type 2, uncontrolled with hyperglycemia and renal complications, advanced CKD.   CBGs are poorly controlled.  HbA1c 8.8.  Continue Lantus 20 units nightly and NovoLog 7units tid with meals  Continue insulin sliding scale and avoid hypoglycemia  Syncope, no recurrence Likely multifactorial including overdiuresis and acute kidney injury and acute infection.  No further episodes in the hospital.  Continue to monitor  Chronic diastolic CHF EF 50 to 59% based on echocardiogram done in May. He has been diuresed aggressively perhaps to the point of becoming hypovolemic as discussed above.  Diuretics are now on hold. He has lost about 40 pounds or so in the last 2 months.  Euvolemic on exam. Not on diuretics due to poor renal function  Essential hypertension Blood pressures remained stable. On amlodipine 10 mg daily   Continue to monitor vital signs  Anemia of chronic kidney disease/acute blood loss-bled from his stump, resolved. Hemoglobin is 9.8 from 9.6 from 10.1  History of cocaine abuse His urine drug screen from 7/4 was positive for cocaine Not receptive to counseling  Obesity Estimated body mass index is 35.35 kg/m as calculated from the following: Height as of this encounter: 6\' 3"  (1.905 m). Weight as of this encounter: 128.3 kg.  Behavioral issues Patient has exhibited belligerent behavior with staff. He becomes verbally abusive. Appears to have  very poor understanding of his multiple medical problems.  His sisters Zachary Young and Zachary Young are his medical decision makers.  Goals of care Seen by palliative care previously.  Palliative care team reconsulted to assist with establishing goals of care, appreciate assistance. His sisters Zachary Young and Zachary Young are his medical decision makers.   DVT Prophylaxis:Low-dose Lovenox subcu daily Code Status:Full code Family Communication:Updated his Sister Zachary Young via phone. Disposition Plan:Status is: Inpatient    Status is: Inpatient  Remains inpatient appropriate because:Inpatient level of care appropriate due to severity of illness   Dispo:             Patient From: Home             Planned Disposition: To be determined              Expected discharge date: 11/08/19             Medically not stable for discharge due to advanced renal failure and possible uremia.       Objective: Vitals:   11/04/19 1742 11/04/19 2334 11/05/19 0605 11/05/19 1247  BP: 128/64 121/74 111/61 (!) 132/73  Pulse: 86 77 76 84  Resp: 16 17 18 18   Temp: 97.9 F (36.6 C) 97.6 F (36.4  C) 98.3 F (36.8 C) 98.2 F (36.8 C)  TempSrc: Oral Oral Oral Oral  SpO2: 98% 100% 96% 100%  Weight:   128 kg   Height:        Intake/Output Summary (Last 24 hours) at 11/05/2019 1418 Last data filed at 11/05/2019 2595 Gross per 24 hour  Intake 898 ml  Output --  Net 898 ml   Filed Weights   11/03/19 2318 11/04/19 0425 11/05/19 0605  Weight: 130.2 kg 130.2 kg 128 kg    Exam:  . General: 59 y.o. year-old male Obese in NAD A&O x 3 . Cardiovascular:Regular rate and rhythm with no rubs or gallops. Marland Kitchen Respiratory: Clear to auscultation with no wheezes or rales. . Abdomen: Obese non tender normal bowel sounds . Musculoskeletal: R below the knee amputation.  Trace edema in left lower extremity. Marland Kitchen Psychiatry:Mood is appropriate  Data Reviewed: CBC: Recent Labs  Lab 10/31/19 1040 11/01/19 0444 11/02/19 0613  11/04/19 0959 11/05/19 0724  WBC 8.1 7.3 7.1 8.2 8.1  HGB 9.3* 10.3* 10.1* 9.6* 9.8*  HCT 31.5* 33.8* 32.7* 31.9* 32.7*  MCV 86.3 86.4 84.5 85.3 84.9  PLT 429* 409* 370 367 638   Basic Metabolic Panel: Recent Labs  Lab 10/30/19 0702 10/30/19 0702 11/01/19 0456 11/01/19 1314 11/02/19 0613 11/04/19 0513 11/05/19 0724  NA 137  --  136  --  136 134* 136  K 5.0   < > 5.9* 4.5 4.5 4.3 4.1  CL 97*  --  99  --  101 103 104  CO2 29  --  25  --  23 20* 19*  GLUCOSE 133*  --  134*  --  158* 177* 81  BUN 124*  --  120*  --  111* 105* 100*  CREATININE 5.58*  --  5.64*  --  5.42* 5.98* 6.04*  CALCIUM 9.9  --  9.6  --  9.5 9.6 9.2  PHOS 5.6*  --   --   --   --   --   --    < > = values in this interval not displayed.   GFR: Estimated Creatinine Clearance: 19.4 mL/min (A) (by C-G formula based on SCr of 6.04 mg/dL (H)). Liver Function Tests: Recent Labs  Lab 10/30/19 0702  ALBUMIN 2.5*   No results for input(s): LIPASE, AMYLASE in the last 168 hours. No results for input(s): AMMONIA in the last 168 hours. Coagulation Profile: Recent Labs  Lab 11/05/19 1221  INR 1.0   Cardiac Enzymes: No results for input(s): CKTOTAL, CKMB, CKMBINDEX, TROPONINI in the last 168 hours. BNP (last 3 results) No results for input(s): PROBNP in the last 8760 hours. HbA1C: No results for input(s): HGBA1C in the last 72 hours. CBG: Recent Labs  Lab 11/04/19 2121 11/05/19 0153 11/05/19 0359 11/05/19 0629 11/05/19 1208  GLUCAP 151* 113* 154* 72 162*   Lipid Profile: No results for input(s): CHOL, HDL, LDLCALC, TRIG, CHOLHDL, LDLDIRECT in the last 72 hours. Thyroid Function Tests: No results for input(s): TSH, T4TOTAL, FREET4, T3FREE, THYROIDAB in the last 72 hours. Anemia Panel: No results for input(s): VITAMINB12, FOLATE, FERRITIN, TIBC, IRON, RETICCTPCT in the last 72 hours. Urine analysis:    Component Value Date/Time   COLORURINE YELLOW 08/15/2019 1538   APPEARANCEUR CLEAR 08/15/2019  1538   LABSPEC 1.010 08/15/2019 1538   PHURINE 5.0 08/15/2019 1538   GLUCOSEU NEGATIVE 08/15/2019 1538   HGBUR SMALL (A) 08/15/2019 1538   BILIRUBINUR NEGATIVE 08/15/2019 1538   KETONESUR NEGATIVE 08/15/2019  [place] >=300 (A) 08/15/2019 1538   NITRITE NEGATIVE 08/15/2019 1538   LEUKOCYTESUR NEGATIVE 08/15/2019 1538   Sepsis Labs: @LABRCNTIP (procalcitonin:4,lacticidven:4)  )No results found for this or any previous visit (from the past 240 hour(s)).    Studies: No results found.  Scheduled Meds: . amLODipine  5 mg Oral Daily  . aspirin EC  81 mg Oral Daily  . collagenase   Topical Daily  . docusate sodium  100 mg Oral BID  . gabapentin  300 mg Oral Daily   And  . gabapentin  600 mg Oral QHS  . insulin aspart  0-15 Units Subcutaneous Q4H  . insulin aspart  7 Units Subcutaneous TID WC  . insulin glargine  20 Units Subcutaneous QHS  . multivitamin with minerals  1 tablet Oral Daily  . nutrition supplement (JUVEN)  1 packet Oral BID BM  . prochlorperazine  10 mg Intravenous QHS  . Ensure Max Protein  11 oz Oral BID  . sodium chloride flush  3 mL Intravenous Q12H  . sodium polystyrene  10 g Oral Daily    Continuous Infusions: .  ceFAZolin (ANCEF) IV       LOS: 19 days     Kayleen Memos, MD [facility] Pager [phone]  If 7PM-7AM, please contact night-coverage [email or web address] Password [email or web address] 11/05/2019, 2:18 PM

## 2019-11-05 NOTE — Social Work (Addendum)
Referral sent through hub to [facility] in [place] per pt family request. Admissions liaison will review and respond back to this Probation officer.  Upon checking hub this referral has been declined by [facility].   Westley Hummer, MSW, [place] Work

## 2019-11-05 NOTE — Progress Notes (Signed)
Patient ID: Zachary Young, male   DOB: 1960/08/25, 59 y.o.   MRN: [id number] Patient is 2 weeks status post right transtibial amputation.  Patient states he did fall on the residual limb and has had some bleeding.  Patient denies any wound dehiscence.  Recommend holding anticoagulation therapy until the hematoma has resolved.  I will follow-up as an outpatient.

## 2019-11-05 NOTE — Progress Notes (Signed)
I went to talk with Zachary Young to clarify his statement to Dr. Sharol Given saying that he had fallen on his stump. Zachary Young says he did not fall on the floor but did hit his leg when attempting to get to the [facility] with out assistance. He has been told numerous times to not get up on his own but he continues to do so. He refuses to let us set his bed alarm and refuses to let us help him to the [facility]. He has been educated about the risks of him falling but he will not listen and says he does not care.

## 2019-11-05 NOTE — Progress Notes (Signed)
Occupational Therapy Treatment Patient Details Name: Zachary Young MRN: [id number] DOB: 1960-11-19 Today's Date: 11/05/2019    History of present illness Pt is a 59 y/o male s/p R transtibial amputation. PMH including but not limited to CHF, DM, HTN, L transmet amputation.   OT comments  Pt. Has decreased I and safety with ADLs and worked on increasing ability to care for himself. Pt. Refused OOB today. Pt. Was cooperative with OT treatment. Pt. Would benefit from SNF  For rehab. Acute OT to follow.   Follow Up Recommendations  SNF;CIR;Other (comment)    Equipment Recommendations  3 in 1 bedside commode;Other (comment)    Recommendations for Other Services      Precautions / Restrictions Precautions Precautions: Fall Precaution Comments: R limb protector Required Braces or Orthoses: Other Brace Other Brace: R limb protector Restrictions RLE Weight Bearing: Non weight bearing       Mobility Bed Mobility Overal bed mobility: Needs Assistance Bed Mobility: Rolling Rolling: Supervision   Supine to sit: Supervision        Transfers                      Balance                                           ADL either performed or assessed with clinical judgement   ADL       Grooming: Set up;Sitting   Upper Body Bathing: Set up;Sitting   Lower Body Bathing: Minimal assistance;Sitting/lateral leans   Upper Body Dressing : Set up;Sitting   Lower Body Dressing:  (pt. refused to work on [employer]. )   Armed forces technical officer:  (refused)           Functional mobility during ADLs:  (refused OOB) General ADL Comments: Pt. worked on increasing I with ADLs. Pt. refused OOB today.      Vision       Perception     Praxis      Cognition Arousal/Alertness: Awake/alert Behavior During Therapy: WFL for tasks assessed/performed   Area of Impairment: Safety/judgement;Attention;Following commands;Problem solving                        Following Commands: Follows one step commands consistently       General Comments: Pt. is refusing to use bed alarm        Exercises     Shoulder Instructions       General Comments      Pertinent Vitals/ Pain       Pain Assessment: 0-10 Pain Score: 2  Pain Location: R residual limb Pain Descriptors / Indicators: Sore Pain Intervention(s): Monitored during session  Home Living                                          Prior Functioning/Environment              Frequency  Min 2X/week        Progress Toward Goals  OT Goals(current goals can now be found in the care plan section)  Progress towards OT goals: Progressing toward goals  Acute Rehab OT Goals Patient Stated Goal: to go home OT Goal Formulation: With patient Time For Goal Achievement: 11/19/19 Potential  to Achieve Goals: Good ADL Goals Pt Will Perform Lower Body Dressing: with modified independence;with adaptive equipment;sitting/lateral leans Pt Will Transfer to Toilet: stand pivot transfer;with min assist Pt Will Perform Tub/Shower Transfer: Tub transfer;tub bench;with min assist  Plan      Co-evaluation                 AM-PAC OT "6 Clicks" Daily Activity     Outcome Measure   Help from another person eating meals?: None Help from another person taking care of personal grooming?: A Little Help from another person toileting, which includes using toliet, bedpan, or urinal?: A Lot Help from another person bathing (including washing, rinsing, drying)?: A Lot Help from another person to put on and taking off regular upper body clothing?: A Little Help from another person to put on and taking off regular lower body clothing?: A Lot 6 Click Score: 16    End of Session    OT Visit Diagnosis: Other abnormalities of gait and mobility (R26.89);Unsteadiness on feet (R26.81);Muscle weakness (generalized) (M62.81);Other symptoms and signs involving cognitive  function;Pain   Activity Tolerance     Patient Left in bed;with call bell/phone within reach   Nurse Communication  (pt is refusing bed alarm)        Time: 7530-0511 OT Time Calculation (min): 38 min  Charges: OT General Charges $OT Visit: 1 Visit OT Treatments $Self Care/Home Management : 38-52 mins  [name] OT/L   [name] 11/05/2019, 1:31 PM

## 2019-11-05 NOTE — Progress Notes (Signed)
[facility] Progress Note    Assessment/ Plan:   1. CKD5, now progressing to ESRD with evidence of early uremic symptoms -now agreeable to hemodialysis initiation, will start with slow start protocol.  Placed consult in for IR for tunneled dialysis catheter.  His disposition is currently unclear but is currently being worked on especially to find him a place to stay and sort out his parole.  Have discussed this with our renal navigator -Renal diet -Avoid nephrotoxic medications including NSAIDs and iodinated intravenous contrast exposure unless the latter is absolutely indicated.  Preferred narcotic agents for pain control are hydromorphone, fentanyl, and methadone. Morphine should not be used. Avoid Baclofen and avoid oral sodium phosphate and magnesium citrate based laxatives / bowel preps. Continue strict Input and Output monitoring. Will monitor the patient closely with you and intervene or adjust therapy as indicated by changes in clinical status/labs  2. Metabolic acidosis, will start nahco3 650mg  bid 3. Chronic diastolic and systolic heart failure 4. Cocaine use 5. Chronic anemia 6. Homeless 7. Diabetes mellitus type 2  Subjective:   Now agreeable to starting dialysis.  He is reporting that the medications he is getting to control his nausea and vomiting is no longer helping.   Objective:   BP (!) 132/73 (BP Location: Left Arm)   Pulse 84   Temp 98.2 F (36.8 C) (Oral)   Resp 18   Ht 6\' 3"  (1.905 m)   Wt 128 kg   SpO2 100%   BMI 35.27 kg/m   Intake/Output Summary (Last 24 hours) at 11/05/2019 1412 Last data filed at 11/05/2019 8099 Gross per 24 hour  Intake 898 ml  Output --  Net 898 ml   Weight change: -2.2 kg  Physical Exam: Gen: No acute distress CVS: Regular rate Resp: Clear to auscultation bilaterally Abd: Obese, nontender Ext: Left lower extremity edema, right lower extremity amputation Neuro: no asterixis  Imaging: No results  found.  Labs: BMET Recent Labs  Lab 10/30/19 0702 11/01/19 0456 11/01/19 1314 11/02/19 0613 11/04/19 0513 11/05/19 0724  NA 137 136  --  136 134* 136  K 5.0 5.9* 4.5 4.5 4.3 4.1  CL 97* 99  --  101 103 104  CO2 29 25  --  23 20* 19*  GLUCOSE 133* 134*  --  158* 177* 81  BUN 124* 120*  --  111* 105* 100*  CREATININE 5.58* 5.64*  --  5.42* 5.98* 6.04*  CALCIUM 9.9 9.6  --  9.5 9.6 9.2  PHOS 5.6*  --   --   --   --   --    CBC Recent Labs  Lab 11/01/19 0444 11/02/19 0613 11/04/19 0959 11/05/19 0724  WBC 7.3 7.1 8.2 8.1  HGB 10.3* 10.1* 9.6* 9.8*  HCT 33.8* 32.7* 31.9* 32.7*  MCV 86.4 84.5 85.3 84.9  PLT 409* 370 367 375    Medications:    . amLODipine  5 mg Oral Daily  . aspirin EC  81 mg Oral Daily  . collagenase   Topical Daily  . docusate sodium  100 mg Oral BID  . gabapentin  300 mg Oral Daily   And  . gabapentin  600 mg Oral QHS  . insulin aspart  0-15 Units Subcutaneous Q4H  . insulin aspart  7 Units Subcutaneous TID WC  . insulin glargine  20 Units Subcutaneous QHS  . multivitamin with minerals  1 tablet Oral Daily  . nutrition supplement (JUVEN)  1 packet Oral BID BM  .  prochlorperazine  10 mg Intravenous QHS  . Ensure Max Protein  11 oz Oral BID  . sodium chloride flush  3 mL Intravenous Q12H  . sodium polystyrene  10 g Oral Daily      [name], MD [facility] 11/05/2019, 2:12 PM

## 2019-11-05 NOTE — Progress Notes (Signed)
PT Cancellation Note  Patient Details Name: Zachary Young MRN: [id number] DOB: Dec 27, 1960   Cancelled Treatment:    Reason Eval/Treat Not Completed: Patient declined, no reason specified. Pt stating that he was going to surgery today and did not want to work with PT. PT will continue to f/u with pt acutely as available.    [place] 11/05/2019, 3:11 PM

## 2019-11-05 NOTE — Progress Notes (Signed)
Dr. Sharol Given asked me to notify attending MD that he recommends discontinuing Lovenox. I notified Dr. Nevada Crane and she asked me to discontinue this medication. We will continue to monitor stump for bleeding.

## 2019-11-05 NOTE — TOC Progression Note (Addendum)
Transition of Care [facility]) - Progression Note    Patient Details  Name: Zachary Young MRN: [id number] Date of Birth: June 28, 1960  [facility] CM/SW Contact  Jacalyn Lefevre Edson Snowball, RN Phone Number: 11/05/2019, 2:58 PM  Clinical Narrative:      Received a message to call patient's sister Zachary Young. Patient gave consent.   Called Ann [phone] . Zachary Young has called and spoken to "someone" at [facility]. Zachary Young was told they have beds available and would need a referral from hospital . Zachary Young was unaware [facility] is located in [place]. NCM discussed with Zachary Young that [facility] will send referral. If patient starting hemodialysis will need to be arranged at [place].  TOC called  [facility] [phone] and left message. TOC sent referral on HUB.     Denis from [facility] declined referral. Zachary Young aware. Zachary Young would like patient faxed to SNF 's in [place] and surrounding counties   Expected Discharge Plan and Services     Discharge Planning Services: CM Consult   Living arrangements for the past 2 months: Single Family Home                                       Social Determinants of Health (SDOH) Interventions    Readmission Risk Interventions No flowsheet data found.

## 2019-11-05 NOTE — Procedures (Signed)
Interventional Radiology Procedure Note  Procedure: Placement of a right IJ approach tunneled HD catheter.  Tip is positioned at the superior cavoatrial junction and catheter is ready for immediate use.  Complications: None Recommendations:  - Ok to use - Do not submerge - Routine line care   Signed,  Anastasio Wogan S. Hoby Kawai, DO    

## 2019-11-05 NOTE — Consult Note (Signed)
Chief Complaint: Patient was seen in consultation today for tunneled dialysis catheter placement  Chief Complaint  Patient presents with  . Dizziness   at the request of Dr Loyal Gambler   Supervising Physician: Corrie Mckusick  Patient Status: [facility] - In-pt  History of Present Illness: Zachary Young is a 59 y.o. male   CKD IV--- follows with Dr Radene Knee at [facility] DM; CHF; HTN Presented for wound post R BKA 10/21/19 A/CKD Uremic symptoms per Nephrology Smoker Cocaine use  Dr Candiss Norse Nephrology note: Assessment 1.  Progressive CKD likely progressed to stage V with some uremia as evidenced by nausea and vomiting with significant acidemia 2.  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  Diabetes mellitus type 2 4. cocaine use 5. Chronic diastolic and systolic heart failure 6. Chronic anemia 7. Homelessness  Scheduled now for tunneled dialysis catheter placement He is agreeable   Past Medical History:  Diagnosis Date  . CHF (congestive heart failure) ([place])   . Diabetes mellitus without complication ([place])   . Peripheral edema   . Renal disorder    kidney disease    Past Surgical History:  Procedure Laterality Date  . AMPUTATION Right 10/21/2019   Procedure: RIGHT BELOW KNEE AMPUTATION;  Surgeon: Newt Minion, MD;  Location: [place];  Service: Orthopedics;  Laterality: Right;  . TOE AMPUTATION Bilateral    due to osteomyelitis, all 5 each foot    Allergies: Bee venom, Propofol, Eggs or egg-derived products, Morphine, and Oxycodone  Medications: Prior to Admission medications   Medication Sig Start Date End Date Taking? Authorizing Provider  albuterol (VENTOLIN HFA) 108 (90 Base) MCG/ACT inhaler Inhale 1 puff into the lungs every 4 (four) hours as needed for wheezing or shortness of breath. 08/20/19  Yes Gladys Damme, MD  amLODipine (NORVASC) 10 MG tablet Take 1 tablet (10 mg total) by mouth daily. 08/21/19  Yes Gladys Damme, MD  aspirin EC 81 MG EC  tablet Take 1 tablet (81 mg total) by mouth daily. 08/21/19  Yes Gladys Damme, MD  ergocalciferol (VITAMIN D2) 1.25 MG (50000 UT) capsule Take 50,000 Units by mouth every Friday.  07/13/19  Yes [provider]  gabapentin (NEURONTIN) 300 MG capsule Take 300-600 mg by mouth See admin instructions. Take one capsule (300 mg) by mouth every morning and two capsules (600 mg) at night   Yes [provider]  insulin glargine (LANTUS) 100 UNIT/ML injection Inject 10 Units into the skin at bedtime.    Yes [provider]  insulin lispro (HUMALOG) 100 UNIT/ML injection Inject 4 Units into the skin 3 (three) times daily with meals.    Yes [provider]  oxyCODONE-acetaminophen (PERCOCET/ROXICET) 5-325 MG tablet Take by mouth every 6 (six) hours as needed for severe pain.   Yes [provider]  torsemide (DEMADEX) 20 MG tablet Take 4 tablets (80 mg total) by mouth 2 (two) times daily. Patient taking differently: Take 20 mg by mouth daily.  08/20/19  Yes Gladys Damme, MD  Insulin Pen Needle (PEN NEEDLES 31GX5/16") 31G X 8 MM MISC 1 each by Other route daily. 05/16/19 05/15/20  [provider]  Insulin Syringe-Needle U-100 (GLOBAL INJECT EASE INSULIN SYR) 30G X 1/2" 0.5 ML MISC 1 each by Other route 4 (four) times daily as needed. Insulin 07/13/19   [provider]  metoprolol succinate (TOPROL-XL) 100 MG 24 hr tablet Take 100 mg by mouth daily. Take with or immediately following a meal. Patient not  taking: Reported on 10/17/2019    [provider]  potassium chloride 20 MEQ TBCR Take 40 mEq by mouth daily. Patient not taking: Reported on 10/17/2019 08/20/19 09/19/19  Eulis Foster, MD     Family History  Problem Relation Age of Onset  . Cancer Mother     Social History   Socioeconomic History  . Marital status: Single    Spouse name: Not on file  . Number of children: Not on file  . Years of education: Not on file  . Highest  education level: Not on file  Occupational History  . Not on file  Tobacco Use  . Smoking status: Current Every Day Smoker    Packs/day: 0.50    Types: Cigarettes  . Smokeless tobacco: Never Used  Vaping Use  . Vaping Use: Never used  Substance and Sexual Activity  . Alcohol use: Not Currently  . Drug use: Yes    Types: Cocaine  . Sexual activity: Not on file  Other Topics Concern  . Not on file  Social History Narrative  . Not on file   Social Determinants of Health   Financial Resource Strain:   . Difficulty of Paying Living Expenses:   Food Insecurity:   . Worried About Charity fundraiser in the Last Year:   . Arboriculturist in the Last Year:   Transportation Needs:   . Film/video editor (Medical):   Marland Kitchen Lack of Transportation (Non-Medical):   Physical Activity:   . Days of Exercise per Week:   . Minutes of Exercise per Session:   Stress:   . Feeling of Stress :   Social Connections:   . Frequency of Communication with Friends and Family:   . Frequency of Social Gatherings with Friends and Family:   . Attends Religious Services:   . Active Member of Clubs or Organizations:   . Attends Archivist Meetings:   Marland Kitchen Marital Status:     Review of Systems: A 12 point ROS discussed and pertinent positives are indicated in the HPI above.  All other systems are negative.  Review of Systems  Constitutional: Positive for activity change. Negative for fever and unexpected weight change.  Gastrointestinal: Negative for abdominal pain.  Musculoskeletal: Positive for gait problem.  Psychiatric/Behavioral: Negative for behavioral problems and confusion.    Vital Signs: BP 111/61 (BP Location: Left Arm)   Pulse 76   Temp 98.3 F (36.8 C) (Oral)   Resp 18   Ht 6\' 3"  (1.905 m)   Wt 282 lb 3 oz (128 kg)   SpO2 96%   BMI 35.27 kg/m   Physical Exam Vitals reviewed.  Cardiovascular:     Rate and Rhythm: Regular rhythm.     Heart sounds: Normal heart  sounds.  Pulmonary:     Breath sounds: Normal breath sounds. No wheezing.  Musculoskeletal:        General: Normal range of motion.     Comments: R BKA  Skin:    General: Skin is warm.  Neurological:     Mental Status: He is alert and oriented to person, place, and time.  Psychiatric:        Behavior: Behavior normal.     Imaging: US RENAL  Result Date: 10/29/2019 CLINICAL DATA:  Acute exacerbation of chronic renal disease EXAM: RENAL / URINARY TRACT ULTRASOUND COMPLETE COMPARISON:  None. FINDINGS: Right Kidney: Renal measurements: 11.1 x 5.4 x 5.6 cm = volume: 172.4 mL . Echogenicity is  increased. Renal cortical thickness is low normal. No mass, perinephric fluid, or hydronephrosis visualized. No sonographically demonstrable calculus or ureterectasis. Left Kidney: Renal measurements: 9.0 x 5.3 x 4.3 cm = volume: 107.4 mL. Echogenicity is increased. Renal cortical thickness within normal limits. No mass, perinephric fluid, or hydronephrosis visualized. No sonographically demonstrable calculus or ureterectasis. Bladder: Appears normal for degree of bladder distention. Other: None. IMPRESSION: Increased renal echogenicity, a finding indicative of medical renal disease. No obstructing focus in either kidney. Borderline size discrepancy between kidneys. Significance of this finding uncertain. This finding potentially could indicate a degree of renal artery stenosis on the left. In this regard, question whether patient is hypertensive. Study otherwise unremarkable. Note that there is overlying gas making evaluation of the left kidney somewhat less than optimal. Electronically Signed   By: Lowella Grip III M.D.   On: 10/29/2019 14:44   DG CHEST PORT 1 VIEW  Result Date: 11/01/2019 CLINICAL DATA:  Shortness of breath. EXAM: PORTABLE CHEST 1 VIEW COMPARISON:  Aug 30, 2019. FINDINGS: Normal cardiomediastinal silhouette. No pneumothorax or pleural effusion is noted. Minimal interstitial densities  are noted in both lung bases which may represent scarring or possibly edema. The visualized skeletal structures are unremarkable. IMPRESSION: Minimal interstitial densities are noted in both lung bases which may represent scarring or possibly edema. Electronically Signed   By: Marijo Conception M.D.   On: 11/01/2019 07:57   DG Abd 2 Views  Result Date: 10/29/2019 CLINICAL DATA:  Abdominal distension, nausea, vomiting. EXAM: ABDOMEN - 2 VIEW COMPARISON:  None. FINDINGS: The bowel gas pattern is normal. There is no evidence of free air. No radio-opaque calculi or other significant radiographic abnormality is seen. IMPRESSION: Negative. Electronically Signed   By: Marijo Conception M.D.   On: 10/29/2019 08:47   AP / lateral X-ray Right foot  Result Date: 10/17/2019 CLINICAL DATA:  Right foot wound with blackening along the plantar aspect around the 3rd to 5th metatarsals EXAM: RIGHT FOOT - 2 VIEW COMPARISON:  None. FINDINGS: Remote postsurgical changes from prior transmetatarsal amputation of the second through fifth rays as well as resection of the first ray at the level of the tarsometatarsal joint. Some heterotopic ossification is noted along the resection margins. There is extensive soft tissue swelling about the forefoot including some crescentic linear lucencies of gas along the plantar surface and some focal irregularity which could reflect site of ulceration. Marked edematous changes are seen subjacent to the site of possible ulceration as well as subcortical lucency involving the adjacent metatarsal resection margin, likely about the fourth or fifth metatarsal. Additional retracted soft tissue/cicatricial change noted towards the more dorsal aspect of the forefoot as well. IMPRESSION: Soft tissue swelling and ulceration at the plantar surface of the forefoot with findings concerning for osteomyelitis of the adjacent fourth and fifth metatarsal resection margins. Findings suspicious for osteomyelitis.  Electronically Signed   By: Lovena Le M.D.   On: 10/17/2019 21:55   VAS [place] ABI WITH/WO TBI  Result Date: 10/18/2019 LOWER EXTREMITY DOPPLER STUDY Indications: Ulceration, and cellulitis. High Risk Factors: Hypertension, Diabetes. Other Factors: Amputation of all 5 toes on bilateral feet, secondary to                osteomyelitis.  Limitations: Today's exam was limited due to venous interference. Comparison Study: No prior study on file Performing Technologist: Sharion Dove RVS  Examination Guidelines: A complete evaluation includes at minimum, Doppler waveform signals and systolic blood pressure reading at the  level of bilateral brachial, anterior tibial, and posterior tibial arteries, when vessel segments are accessible. Bilateral testing is considered an integral part of a complete examination. Photoelectric Plethysmograph (PPG) waveforms and toe systolic pressure readings are included as required and additional duplex testing as needed. Limited examinations for reoccurring indications may be performed as noted.  ABI Findings: +---------+------------------+-----+-------------------+----------+ Right    Rt Pressure (mmHg)IndexWaveform           Comment    +---------+------------------+-----+-------------------+----------+ Brachial 151                    triphasic                     +---------+------------------+-----+-------------------+----------+ PTA      121               0.80 dampened monophasic           +---------+------------------+-----+-------------------+----------+ DP       104               0.69 dampened monophasic           +---------+------------------+-----+-------------------+----------+ Great Toe                                          amputation +---------+------------------+-----+-------------------+----------+ +---------+------------------+-----+-------------------+----------+ Left     Lt Pressure (mmHg)IndexWaveform           Comment     +---------+------------------+-----+-------------------+----------+ Brachial                        triphasic          IV         +---------+------------------+-----+-------------------+----------+ PTA      165               1.09 biphasic                      +---------+------------------+-----+-------------------+----------+ DP       44                0.29 dampened monophasic           +---------+------------------+-----+-------------------+----------+ Great Toe                                          amputation +---------+------------------+-----+-------------------+----------+ +-------+-----------+-----------+------------+------------+ ABI/TBIToday's ABIToday's TBIPrevious ABIPrevious TBI +-------+-----------+-----------+------------+------------+ Right  0.8                                            +-------+-----------+-----------+------------+------------+ Left   1.09                                           +-------+-----------+-----------+------------+------------+  Summary: Right: Resting right ankle-brachial index indicates mild right lower extremity arterial disease. ABIs are unreliable. Left: Resting left ankle-brachial index is within normal range. No evidence of significant left lower extremity arterial disease. ABIs are unreliable.  *See table(s) above for measurements and observations.  Electronically signed by Monica Martinez MD on 10/18/2019 at 1:13:16 PM.    Final  VAS [place] LOWER EXTREMITY VENOUS (DVT)  Result Date: 10/18/2019  Lower Venous DVTStudy Indications: Swelling, Pain, and cellulitis.  Comparison Study: Prior study from 08/16/19 is available for comparison Performing Technologist: Sharion Dove RVS  Examination Guidelines: A complete evaluation includes B-mode imaging, spectral Doppler, color Doppler, and power Doppler as needed of all accessible portions of each vessel. Bilateral testing is considered an integral part of a complete  examination. Limited examinations for reoccurring indications may be performed as noted. The reflux portion of the exam is performed with the patient in reverse Trendelenburg.  +---------+---------------+---------+-----------+----------+-------------------+ RIGHT    CompressibilityPhasicitySpontaneityPropertiesThrombus Aging      +---------+---------------+---------+-----------+----------+-------------------+ CFV      Full           Yes      Yes                                      +---------+---------------+---------+-----------+----------+-------------------+ SFJ      Full                                                             +---------+---------------+---------+-----------+----------+-------------------+ FV Prox  Full                                                             +---------+---------------+---------+-----------+----------+-------------------+ FV Mid   Full                                                             +---------+---------------+---------+-----------+----------+-------------------+ FV Distal                                             patent by color and                                                       Doppler             +---------+---------------+---------+-----------+----------+-------------------+ POP      Full           Yes      Yes                                      +---------+---------------+---------+-----------+----------+-------------------+ PTV      Full                                                             +---------+---------------+---------+-----------+----------+-------------------+  PERO                                                  Not visualized      +---------+---------------+---------+-----------+----------+-------------------+   +----+---------------+---------+-----------+----------+--------------+ LEFTCompressibilityPhasicitySpontaneityPropertiesThrombus Aging  +----+---------------+---------+-----------+----------+--------------+ CFV Full           Yes      Yes                                 +----+---------------+---------+-----------+----------+--------------+     Summary: RIGHT: - Findings appear essentially unchanged compared to previous examination. - There is no evidence of deep vein thrombosis in the lower extremity. However, portions of this examination were limited- see technologist comments above.  - Ultrasound characteristics of enlarged lymph nodes are noted in the groin.  LEFT: - No evidence of common femoral vein obstruction. - Ultrasound characteristics of enlarged lymph nodes noted in the groin.  *See table(s) above for measurements and observations. Electronically signed by Monica Martinez MD on 10/18/2019 at 1:16:53 PM.    Final     Labs:  CBC: Recent Labs    11/01/19 0444 11/02/19 9735 11/04/19 0959 11/05/19 0724  WBC 7.3 7.1 8.2 8.1  HGB 10.3* 10.1* 9.6* 9.8*  HCT 33.8* 32.7* 31.9* 32.7*  PLT 409* 370 367 375    COAGS: Recent Labs    12/14/18 1925 12/15/18 0705 08/30/19 2152  INR 1.0 1.0 1.0  APTT 31  --   --     BMP: Recent Labs    11/01/19 0456 11/01/19 0456 11/01/19 1314 11/02/19 0613 11/04/19 0513 11/05/19 0724  NA 136  --   --  136 134* 136  K 5.9*   < > 4.5 4.5 4.3 4.1  CL 99  --   --  101 103 104  CO2 25  --   --  23 20* 19*  GLUCOSE 134*  --   --  158* 177* 81  BUN 120*  --   --  111* 105* 100*  CALCIUM 9.6  --   --  9.5 9.6 9.2  CREATININE 5.64*  --   --  5.42* 5.98* 6.04*  GFRNONAA 10*  --   --  11* 10* 9*  GFRAA 12*  --   --  12* 11* 11*   < > = values in this interval not displayed.    LIVER FUNCTION TESTS: Recent Labs    10/25/19 0202 10/25/19 0202 10/26/19 0347 10/28/19 0439 10/29/19 0531 10/30/19 0702  BILITOT 0.3  --  0.4 0.6 0.1*  --   AST 13*  --  13* 36 29  --   ALT <5  --  7 16 20   --   ALKPHOS 77  --  95 106 103  --   PROT 8.1  --  8.7* 8.1 8.1  --   ALBUMIN  2.5*   < > 2.7* 2.6* 2.6* 2.5*   < > = values in this interval not displayed.    TUMOR MARKERS: No results for input(s): AFPTM, CEA, CA199, CHROMGRNA in the last 8760 hours.  Assessment and Plan:  A/CKD Uremic symptoms per Nephrology Scheduled for tunneled dialysis catheter asap-- to initiate dialysis Risks and benefits discussed with the patient including, but not limited to bleeding, infection, vascular injury, pneumothorax which may require  chest tube placement, air embolism or even death  All of the patient's questions were answered, patient is agreeable to proceed. Consent signed and in chart.   Thank you for this interesting consult.  I greatly enjoyed meeting Zachary Young and look forward to participating in their care.  A copy of this report was sent to the requesting provider on this date.  Electronically Signed: Lavonia Drafts, PA-C 11/05/2019, 11:39 AM   I spent a total of 20 Minutes    in face to face in clinical consultation, greater than 50% of which was counseling/coordinating care for tunn HD cath

## 2019-11-06 DIAGNOSIS — L02611 Cutaneous abscess of right foot: Secondary | ICD-10-CM | POA: Diagnosis not present

## 2019-11-06 LAB — GLUCOSE, CAPILLARY
Glucose-Capillary: 139 mg/dL — ABNORMAL HIGH (ref 70–99)
Glucose-Capillary: 125 mg/dL — ABNORMAL HIGH (ref 70–99)
Glucose-Capillary: 113 mg/dL — ABNORMAL HIGH (ref 70–99)
Glucose-Capillary: 76 mg/dL (ref 70–99)
Glucose-Capillary: 137 mg/dL — ABNORMAL HIGH (ref 70–99)
Glucose-Capillary: 182 mg/dL — ABNORMAL HIGH (ref 70–99)
Glucose-Capillary: 187 mg/dL — ABNORMAL HIGH (ref 70–99)

## 2019-11-06 LAB — FERRITIN: Ferritin: 86 ng/mL (ref 24–336)

## 2019-11-06 LAB — CBC WITH DIFFERENTIAL/PLATELET
Abs Immature Granulocytes: 0.02 10*3/uL (ref 0.00–0.07)
Basophils Absolute: 0 10*3/uL (ref 0.0–0.1)
Basophils Relative: 0 %
Eosinophils Absolute: 0.3 10*3/uL (ref 0.0–0.5)
Eosinophils Relative: 5 %
HCT: 28.4 % — ABNORMAL LOW (ref 39.0–52.0)
Hemoglobin: 8.8 g/dL — ABNORMAL LOW (ref 13.0–17.0)
Immature Granulocytes: 0 %
Lymphocytes Relative: 31 %
Lymphs Abs: 2.2 10*3/uL (ref 0.7–4.0)
MCH: 26.4 pg (ref 26.0–34.0)
MCHC: 31 g/dL (ref 30.0–36.0)
MCV: 85.3 fL (ref 80.0–100.0)
Monocytes Absolute: 0.7 10*3/uL (ref 0.1–1.0)
Monocytes Relative: 9 %
Neutro Abs: 3.9 10*3/uL (ref 1.7–7.7)
Neutrophils Relative %: 55 %
Platelets: 356 10*3/uL (ref 150–400)
RBC: 3.33 MIL/uL — ABNORMAL LOW (ref 4.22–5.81)
RDW: 15.5 % (ref 11.5–15.5)
WBC: 7.1 10*3/uL (ref 4.0–10.5)
nRBC: 0 % (ref 0.0–0.2)

## 2019-11-06 LAB — RENAL FUNCTION PANEL
Albumin: 2.6 g/dL — ABNORMAL LOW (ref 3.5–5.0)
Albumin: 2.5 g/dL — ABNORMAL LOW (ref 3.5–5.0)
Anion gap: 7 (ref 5–15)
Anion gap: 12 (ref 5–15)
BUN: 16 mg/dL (ref 6–20)
BUN: 88 mg/dL — ABNORMAL HIGH (ref 6–20)
CO2: 27 mmol/L (ref 22–32)
CO2: 19 mmol/L — ABNORMAL LOW (ref 22–32)
Calcium: 8.7 mg/dL — ABNORMAL LOW (ref 8.9–10.3)
Calcium: 9.2 mg/dL (ref 8.9–10.3)
Chloride: 97 mmol/L — ABNORMAL LOW (ref 98–111)
Chloride: 105 mmol/L (ref 98–111)
Creatinine, Ser: 0.96 mg/dL (ref 0.61–1.24)
Creatinine, Ser: 5.27 mg/dL — ABNORMAL HIGH (ref 0.61–1.24)
GFR calc Af Amer: >60 mL/min (ref >60)
GFR calc Af Amer: 13 mL/min — ABNORMAL LOW (ref >60)
GFR calc non Af Amer: >60 mL/min (ref >60)
GFR calc non Af Amer: 11 mL/min — ABNORMAL LOW (ref >60)
Glucose, Bld: 332 mg/dL — ABNORMAL HIGH (ref 70–99)
Glucose, Bld: 182 mg/dL — ABNORMAL HIGH (ref 70–99)
Phosphorus: 3 mg/dL (ref 2.5–4.6)
Phosphorus: 5.2 mg/dL — ABNORMAL HIGH (ref 2.5–4.6)
Potassium: 4.1 mmol/L (ref 3.5–5.1)
Potassium: 4.4 mmol/L (ref 3.5–5.1)
Sodium: 131 mmol/L — ABNORMAL LOW (ref 135–145)
Sodium: 136 mmol/L (ref 135–145)

## 2019-11-06 LAB — IRON AND TIBC
Iron: 28 ug/dL — ABNORMAL LOW (ref 45–182)
Saturation Ratios: 11 % — ABNORMAL LOW (ref 17.9–39.5)
TIBC: 265 ug/dL (ref 250–450)
UIBC: 237 ug/dL

## 2019-11-06 LAB — BASIC METABOLIC PANEL
Anion gap: 9 (ref 5–15)
BUN: 87 mg/dL — ABNORMAL HIGH (ref 6–20)
CO2: 23 mmol/L (ref 22–32)
Calcium: 9.3 mg/dL (ref 8.9–10.3)
Chloride: 106 mmol/L (ref 98–111)
Creatinine, Ser: 5.19 mg/dL — ABNORMAL HIGH (ref 0.61–1.24)
GFR calc Af Amer: 13 mL/min — ABNORMAL LOW (ref >60)
GFR calc non Af Amer: 11 mL/min — ABNORMAL LOW (ref >60)
Glucose, Bld: 183 mg/dL — ABNORMAL HIGH (ref 70–99)
Potassium: 4.4 mmol/L (ref 3.5–5.1)
Sodium: 138 mmol/L (ref 135–145)

## 2019-11-06 LAB — HEPATITIS B SURFACE ANTIGEN: Hepatitis B Surface Ag: NONREACTIVE

## 2019-11-06 LAB — RETICULOCYTES
Immature Retic Fract: 14.6 % (ref 2.3–15.9)
RBC.: 3.21 MIL/uL — ABNORMAL LOW (ref 4.22–5.81)
Retic Count, Absolute: 64.5 10*3/uL (ref 19.0–186.0)
Retic Ct Pct: 2 % (ref 0.4–3.1)

## 2019-11-06 LAB — HEPATITIS B CORE ANTIBODY, TOTAL: Hep B Core Total Ab: NONREACTIVE

## 2019-11-06 LAB — VITAMIN B12: Vitamin B-12: 753 pg/mL (ref 180–914)

## 2019-11-06 MED ORDER — HEPARIN SODIUM (PORCINE) 1000 UNIT/ML IJ SOLN
INTRAMUSCULAR | Status: AC
  Administered 2019-11-06: 3200 [IU] via INTRAVENOUS_CENTRAL
  Filled 2019-11-06: qty 4

## 2019-11-06 MED ORDER — SODIUM CHLORIDE 0.9 % IV SOLN
125.0000 mg | INTRAVENOUS | Status: DC
  Administered 2019-11-09 – 2019-11-14 (×3): 125 mg via INTRAVENOUS
  Filled 2019-11-06 (×6): qty 10

## 2019-11-06 NOTE — Progress Notes (Signed)
PROGRESS NOTE  Zachary Young FXJ:[id number] DOB: 07/28/1960 DOA: 10/17/2019 PCP: System, Pcp Not In  HPI/Recap of past 24 hours:  [name] PMHxCKD stage IV, type II diabetes with hyperglycemia, Diabetic Neuropathy, Chronic systolic and diastolic CHF, HTN. Presentedafter passing out. Zachary Young notes that he went out on to his front porch to get some fresh air. Once out there, he became dizzy and had to lean against the house, and then he passed out. He slid down the side of the house and did not hit his head. He noted no chest pain, no fever, no chills, no change in urination. He does note that he has had a decreased appetite for the last 3-4 days, eating only about half of what he normally does. He has passed out before in the past when his sugar drops, and he thinks this may have happened today. He denies chest pain. He has SOB with ambulation at times. He reports taking torsemide 76m in the morning and evening (4 tablets) and not missing doses. He was having issues with pain in bed, and as I looked at his legs, I noted that his right leg was swollen, warm and mildly erythematous compared to the left.  He noted a new wound on the plantar surface, at the 4th and 5th digit which he noticed first about 2 weeks ago.    Post right below the knee amputation on 10/21/2019 by Dr. DSharol Given  Hospital course complicated by worsening renal function in the setting of CKD IV for which nephrology was consulted.  Has very poor insight into his medical condition.  His 2 sisters Zachary Rowanand Zachary Frohlichare his medical decision makers.  Updated the patient's sister Ms. Zachary Rowanvia phone.  Per his sister he is presently homeless.  He has difficulty understanding the severity of his medical condition.  Her along with her sister Zachary Frohlichare making medical decisions for him.    Patient is on parole and his sister Zachary Rowanis working to lift it up in order to facilitate his placement to SNF.  11/06/19: Seen and examined at  his bedside.  Reports nausea and vomiting.  He is agreeable to hemodialysis.  Nephrology following.  Post right IJ TDC placement by IR on 11/05/2019.  Planned hemodialysis today  Assessment/Plan: Active Problems:   Hypertensive heart disease with CHF (congestive heart failure) (HCC)   Chronic kidney disease, stage 4 (severe) (HCC)   HLD (hyperlipidemia)   HTN (hypertension)   Systolic and diastolic CHF, chronic (HCC)   Tobacco use disorder   Type 2 diabetes mellitus with circulatory disorder, with long-term current use of insulin (HCC)   Wound of right foot   Syncope and collapse   [place]   Subacute osteomyelitis, right ankle and foot (HCC)   Abscess of right foot   Diabetic neuropathy (HCC)   Anemia of chronic disease   Severe obesity (BMI 35.0-39.9) with comorbidity ([place]   Metabolic acidosis   DM (diabetes mellitus), secondary, uncontrolled, with complications (HCC)   Elevated sedimentation rate   Elevated C-reactive protein (CRP)   Hypoalbuminemia   Goals of care, counseling/discussion   Palliative care by specialist  Right foot wound status post BKA on 10/21/19 by Dr. DSharol GivenPatient denies any pain currently.  Patient reports he fell on the residual limb and had some bleeding on 7/21. Seen by ortho on 7/22, patient denies any wound dehiscence Ortho recommends to hold SQ lovenox until the hematoma has resolved.  Will follow up with Ortho  as an outpatient. PT has recommended SNF, difficult placement.  Nonoliguric acute renal failure on chronic kidney disease stage IV Hyperkalemia has resolved, on maintenance daily Kayexalate Followed by nephrology in [place].  Advanced renal failure, post right IJ [facility] placement by IR Dr. Earleen Newport, on 11/05/2019, plan for hemodialysis on 11/06/2019  Resolved post treatment: Hyperkalemia in the setting of acute on chronic renal insufficiency Serum potassium 4.0 from 6.0. Currently on maintenance Kayexalate daily  Intractable  nausea and vomiting suspect secondary to uremia in the setting of advanced CKD HD planned today IV antiemetics as needed  Diabetes mellitus type 2, uncontrolled with hyperglycemia and renal complications, advanced CKD.   CBGs are poorly controlled.  HbA1c 8.8.  Continue Lantus 20 units nightly and NovoLog 7units tid with meals  Continue insulin sliding scale and avoid hypoglycemia  Syncope, no recurrence Likely multifactorial including overdiuresis and acute kidney injury and acute infection.  No further episodes in the hospital.  Continue to monitor  Chronic diastolic CHF EF 50 to 06% based on echocardiogram done in May. He has been diuresed aggressively perhaps to the point of becoming hypovolemic as discussed above.  Diuretics are now on hold. He has lost about 40 pounds or so in the last 2 months.  Euvolemic on exam. Not on diuretics due to poor renal function Volume will be managed with hemodialysis  Essential hypertension Norvasc dose reduced to 5 mg daily to avoid hypotension BP is at goal Continue to monitor vital signs  Anemia of chronic kidney disease/acute blood loss-bled from his stump, resolved. Hemoglobin is 8.8 from 9.8  Acute blood loss anemia from bleeding stump 7/21, and 7/22. Seen by orthopedic surgery, Dr. Sharol Given, on 7/22, recommended to hold off Lovenox until hematoma has resolved Hg as reported above Continue to monitor H&H  History of cocaine abuse His urine drug screen from 7/4 was positive for cocaine Cessation counseling done at bedside  Obesity Estimated body mass index is 35.35 kg/m as calculated from the following: Height as of this encounter: 6' 3" (1.905 m). Weight as of this encounter: 128.3 kg.  Behavioral issues Patient has exhibited belligerent behavior with staff. He becomes verbally abusive. Appears to have very poor understanding of his multiple medical problems.  His sisters Hassan Young and Lelon Young are his medical  decision makers.  Goals of care Seen by palliative care previously.  Palliative care team reconsulted to assist with establishing goals of care, appreciate assistance. His sisters Hassan Young and Lelon Young are his medical decision makers. DNR as of 11/03/2019   DVT Prophylaxis:SCDs Code Status:DNR Family Communication:Updated his Sister Hassan Young via phone. Disposition Plan:Status is: Inpatient    Status is: Inpatient  Remains inpatient appropriate because:Inpatient level of care appropriate due to severity of illness   Dispo:             Patient From: Home             Planned Disposition: To be determined              Expected discharge date: 11/09/19             Medically not stable for discharge due to advanced renal failure starting hemodialysis on 11/06/2019 will need outpatient hemodialysis set up.      Objective: Vitals:   11/06/19 1313 11/06/19 1330 11/06/19 1359 11/06/19 1405  BP: (!) 100/48 (!) 120/64 (!) 116/59 126/73  Pulse: 93 90 85 85  Resp:    18  Temp:    99 F (37.2 C)  TempSrc:    Oral  SpO2:      Weight:      Height:        Intake/Output Summary (Last 24 hours) at 11/06/2019 1503 Last data filed at 11/06/2019 1405 Gross per 24 hour  Intake 100 ml  Output 638 ml  Net -538 ml   Filed Weights   11/05/19 0605 11/06/19 0424 11/06/19 1223  Weight: 128 kg (!) 128.9 kg (!) 128.9 kg    Exam:  . General: 59 y.o. year-old male obese alert oriented x3.  In no acute distress. . Cardiovascular: Regular rate and rhythm no rubs or gallops.   Marland Kitchen Respiratory: Clear to auscultation no wheezes or rales. . Abdomen: Obese nontender normal bowel sounds present.   . Musculoskeletal: Right below the knee amputation.  Trace edema in left lower extremity. Marland Kitchen Psychiatry: Mood is appropriate for condition and setting.   Data Reviewed: CBC: Recent Labs  Lab 11/01/19 0444 11/02/19 0613 11/04/19 0959 11/05/19 0724 11/06/19 0230  WBC 7.3 7.1 8.2 8.1 7.1    NEUTROABS  --   --   --   --  3.9  HGB 10.3* 10.1* 9.6* 9.8* 8.8*  HCT 33.8* 32.7* 31.9* 32.7* 28.4*  MCV 86.4 84.5 85.3 84.9 85.3  PLT 409* 370 367 375 179   Basic Metabolic Panel: Recent Labs  Lab 11/02/19 0613 11/04/19 0513 11/05/19 0724 11/06/19 0551 11/06/19 1238  NA 136 134* 136 131* 138  K 4.5 4.3 4.1 4.1 4.4  CL 101 103 104 97* 106  CO2 23 20* 19* 27 23  GLUCOSE 158* 177* 81 332* 183*  BUN 111* 105* 100* 16 87*  CREATININE 5.42* 5.98* 6.04* 0.96 5.19*  CALCIUM 9.5 9.6 9.2 8.7* 9.3  PHOS  --   --   --  3.0  --    GFR: Estimated Creatinine Clearance: 22.7 mL/min (A) (by C-G formula based on SCr of 5.19 mg/dL (H)). Liver Function Tests: Recent Labs  Lab 11/06/19 0551  ALBUMIN 2.6*   No results for input(s): LIPASE, AMYLASE in the last 168 hours. No results for input(s): AMMONIA in the last 168 hours. Coagulation Profile: Recent Labs  Lab 11/05/19 1221  INR 1.0   Cardiac Enzymes: No results for input(s): CKTOTAL, CKMB, CKMBINDEX, TROPONINI in the last 168 hours. BNP (last 3 results) No results for input(s): PROBNP in the last 8760 hours. HbA1C: No results for input(s): HGBA1C in the last 72 hours. CBG: Recent Labs  Lab 11/06/19 0114 11/06/19 0420 11/06/19 0844 11/06/19 1120 11/06/19 1155  GLUCAP 139* 125* 113* 76 137*   Lipid Profile: No results for input(s): CHOL, HDL, LDLCALC, TRIG, CHOLHDL, LDLDIRECT in the last 72 hours. Thyroid Function Tests: No results for input(s): TSH, T4TOTAL, FREET4, T3FREE, THYROIDAB in the last 72 hours. Anemia Panel: Recent Labs    11/05/19 1933 11/06/19 1238  VITAMINB12  --  753  FOLATE 13.3  --   FERRITIN  --  86  TIBC  --  265  IRON  --  28*   Urine analysis:    Component Value Date/Time   COLORURINE YELLOW 08/15/2019 1538   APPEARANCEUR CLEAR 08/15/2019 1538   LABSPEC 1.010 08/15/2019 1538   PHURINE 5.0 08/15/2019 1538   GLUCOSEU NEGATIVE 08/15/2019 1538   HGBUR SMALL (A) 08/15/2019 1538    BILIRUBINUR NEGATIVE 08/15/2019 1538   KETONESUR NEGATIVE 08/15/2019 1538   PROTEINUR >=300 (A) 08/15/2019 1538   NITRITE NEGATIVE 08/15/2019 1538   LEUKOCYTESUR NEGATIVE 08/15/2019 1538  Sepsis Labs: _0 (procalcitonin:4,lacticidven:4)  )No results found for this or any previous visit (from the past 240 hour(s)).    Studies: IR Fluoro Guide CV Line Right  Result Date: 11/06/2019 INDICATION: 59 year old male referred for tunneled hemodialysis catheter EXAM: TUNNELED CENTRAL VENOUS HEMODIALYSIS CATHETER PLACEMENT WITH ULTRASOUND AND FLUOROSCOPIC GUIDANCE MEDICATIONS: 2 g Ancef. The antibiotic was given in an appropriate time interval prior to skin puncture. ANESTHESIA/SEDATION: Moderate (conscious) sedation was employed during this procedure. A total of Versed 0.5 mg and Fentanyl 0 mcg was administered intravenously. Moderate Sedation Time: 0 minutes. The patient's level of consciousness and vital signs were monitored continuously by radiology nursing throughout the procedure under my direct supervision. FLUOROSCOPY TIME:  Fluoroscopy Time: 0 minutes 6 seconds (2 mGy). COMPLICATIONS: None PROCEDURE: Informed written consent was obtained from the patient after a discussion of the risks, benefits, and alternatives to treatment. Questions regarding the procedure were encouraged and answered. The right neck and chest were prepped with chlorhexidine in a sterile fashion, and a sterile drape was applied covering the operative field. Maximum barrier sterile technique with sterile gowns and gloves were used for the procedure. A timeout was performed prior to the initiation of the procedure. Ultrasound survey was performed. Micropuncture kit was utilized to access the right internal jugular vein under direct, real-time ultrasound guidance after the overlying soft tissues were anesthetized with 1% lidocaine with epinephrine. Stab incision was made with 11 blade scalpel. Microwire was passed centrally. The  microwire was then marked to measure appropriate internal catheter length. External tunneled length was estimated. A total tip to cuff length of 19 cm was selected. 035 guidewire was advanced to the level of the IVC. Skin and subcutaneous tissues of chest wall below the clavicle were generously infiltrated with 1% lidocaine for local anesthesia. A small stab incision was made with 11 blade scalpel. The selected hemodialysis catheter was tunneled in a retrograde fashion from the anterior chest wall to the venotomy incision. Serial dilation was performed and then a peel-away sheath was placed. The catheter was then placed through the peel-away sheath with tips ultimately positioned within the superior aspect of the right atrium. Final catheter positioning was confirmed and documented with a spot radiographic image. The catheter aspirates and flushes normally. The catheter was flushed with appropriate volume heparin dwells. The catheter exit site was secured with a 0-Prolene retention suture. Gel-Foam slurry was infused into the soft tissue tract. The venotomy incision was closed Derma bond and sterile dressing. Dressings were applied at the chest wall. Patient tolerated the procedure well and remained hemodynamically stable throughout. No complications were encountered and no significant blood loss encountered. IMPRESSION: Status post right IJ tunneled hemodialysis catheter placement. Catheter ready for use. Signed, Dulcy Fanny. Dellia Nims, RPVI [facility] Radiology Electronically Signed   By: Corrie Mckusick D.O.   On: 11/06/2019 10:13   IR US Guide Vasc Access Right  Result Date: 11/06/2019 INDICATION: 59 year old male referred for tunneled hemodialysis catheter EXAM: TUNNELED CENTRAL VENOUS HEMODIALYSIS CATHETER PLACEMENT WITH ULTRASOUND AND FLUOROSCOPIC GUIDANCE MEDICATIONS: 2 g Ancef. The antibiotic was given in an appropriate time interval prior to skin puncture.  ANESTHESIA/SEDATION: Moderate (conscious) sedation was employed during this procedure. A total of Versed 0.5 mg and Fentanyl 0 mcg was administered intravenously. Moderate Sedation Time: 0 minutes. The patient's level of consciousness and vital signs were monitored continuously by radiology nursing throughout the procedure under my direct supervision. FLUOROSCOPY TIME:  Fluoroscopy Time: 0 minutes 6 seconds (2  mGy). COMPLICATIONS: None PROCEDURE: Informed written consent was obtained from the patient after a discussion of the risks, benefits, and alternatives to treatment. Questions regarding the procedure were encouraged and answered. The right neck and chest were prepped with chlorhexidine in a sterile fashion, and a sterile drape was applied covering the operative field. Maximum barrier sterile technique with sterile gowns and gloves were used for the procedure. A timeout was performed prior to the initiation of the procedure. Ultrasound survey was performed. Micropuncture kit was utilized to access the right internal jugular vein under direct, real-time ultrasound guidance after the overlying soft tissues were anesthetized with 1% lidocaine with epinephrine. Stab incision was made with 11 blade scalpel. Microwire was passed centrally. The microwire was then marked to measure appropriate internal catheter length. External tunneled length was estimated. A total tip to cuff length of 19 cm was selected. 035 guidewire was advanced to the level of the IVC. Skin and subcutaneous tissues of chest wall below the clavicle were generously infiltrated with 1% lidocaine for local anesthesia. A small stab incision was made with 11 blade scalpel. The selected hemodialysis catheter was tunneled in a retrograde fashion from the anterior chest wall to the venotomy incision. Serial dilation was performed and then a peel-away sheath was placed. The catheter was then placed through the peel-away sheath with tips ultimately  positioned within the superior aspect of the right atrium. Final catheter positioning was confirmed and documented with a spot radiographic image. The catheter aspirates and flushes normally. The catheter was flushed with appropriate volume heparin dwells. The catheter exit site was secured with a 0-Prolene retention suture. Gel-Foam slurry was infused into the soft tissue tract. The venotomy incision was closed Derma bond and sterile dressing. Dressings were applied at the chest wall. Patient tolerated the procedure well and remained hemodynamically stable throughout. No complications were encountered and no significant blood loss encountered. IMPRESSION: Status post right IJ tunneled hemodialysis catheter placement. Catheter ready for use. Signed, Dulcy Fanny. Dellia Nims, RPVI [facility] Radiology Electronically Signed   By: Corrie Mckusick D.O.   On: 11/06/2019 10:13    Scheduled Meds: . amLODipine  5 mg Oral Daily  . aspirin EC  81 mg Oral Daily  . Chlorhexidine Gluconate Cloth  6 each Topical Q0600  . collagenase   Topical Daily  . docusate sodium  100 mg Oral BID  . gabapentin  300 mg Oral Daily   And  . gabapentin  600 mg Oral QHS  . insulin aspart  0-15 Units Subcutaneous Q4H  . insulin aspart  7 Units Subcutaneous TID WC  . insulin glargine  20 Units Subcutaneous QHS  . multivitamin with minerals  1 tablet Oral Daily  . nutrition supplement (JUVEN)  1 packet Oral BID BM  . prochlorperazine  10 mg Intravenous QHS  . Ensure Max Protein  11 oz Oral BID  . sodium chloride flush  3 mL Intravenous Q12H  . sodium polystyrene  10 g Oral Daily    Continuous Infusions: . sodium chloride    . sodium chloride    . [START ON 11/09/2019] ferric gluconate (FERRLECIT/NULECIT) IV       LOS: 20 days     Kayleen Memos, MD [facility] Pager [phone]  If 7PM-7AM, please contact night-coverage [email or web address] Password [facility] 11/06/2019, 3:03  PM

## 2019-11-06 NOTE — Progress Notes (Signed)
[facility] Progress Note    Assessment/ Plan:   1. Progressive CKD5, now ESRD with evidence of early uremic symptoms. [place] placed (7/23) -HD start (#1) 7/23, #2 treatment tomorrow - His disposition is currently unclear but is currently being worked on especially to find him a place to stay and sort out his parole.  Have discussed this with our renal navigator, will continue to follow along -Renal diet -Avoid nephrotoxic medications including NSAIDs and iodinated intravenous contrast exposure unless the latter is absolutely indicated.  Preferred narcotic agents for pain control are hydromorphone, fentanyl, and methadone. Morphine should not be used. Avoid Baclofen and avoid oral sodium phosphate and magnesium citrate based laxatives / bowel preps. Continue strict Input and Output monitoring. Will monitor the patient closely with you and intervene or adjust therapy as indicated by changes in clinical status/labs  2. Metabolic acidosis, will sotp nahco3 given that he is on hd 3. Chronic diastolic and systolic heart failure, stable 4. Cocaine use 5. Anemia of chronic kidney disease -ferric gluconate to start Monday 7/26 6. Homeless 7. Diabetes mellitus type 2  Subjective:   Seen this am and on HD. Sleepy from tdc placement (sedation). No other complaints. bfr 150, 1.5hr treatment.   Objective:   BP (!) 100/48 (BP Location: Left Arm)   Pulse 93   Temp 98.8 F (37.1 C) (Oral)   Resp 18   Ht 6' 3"  (1.905 m)   Wt (!) 128.9 kg   SpO2 98%   BMI 35.52 kg/m   Intake/Output Summary (Last 24 hours) at 11/06/2019 1351 Last data filed at 11/06/2019 0913 Gross per 24 hour  Intake 100 ml  Output 700 ml  Net -600 ml   Weight change: 0.9 kg  Physical Exam: Gen: No acute distress CVS: Regular rate Resp: Clear to auscultation bilaterally Abd: Obese, nontender Ext: Left lower extremity edema, right lower extremity amputation Neuro: no asterixis RIJ tunneled dialysis  catheter  Imaging: IR Fluoro Guide CV Line Right  Result Date: 11/06/2019 INDICATION: 59 year old male referred for tunneled hemodialysis catheter EXAM: TUNNELED CENTRAL VENOUS HEMODIALYSIS CATHETER PLACEMENT WITH ULTRASOUND AND FLUOROSCOPIC GUIDANCE MEDICATIONS: 2 g Ancef. The antibiotic was given in an appropriate time interval prior to skin puncture. ANESTHESIA/SEDATION: Moderate (conscious) sedation was employed during this procedure. A total of Versed 0.5 mg and Fentanyl 0 mcg was administered intravenously. Moderate Sedation Time: 0 minutes. The patient's level of consciousness and vital signs were monitored continuously by radiology nursing throughout the procedure under my direct supervision. FLUOROSCOPY TIME:  Fluoroscopy Time: 0 minutes 6 seconds (2 mGy). COMPLICATIONS: None PROCEDURE: Informed written consent was obtained from the patient after a discussion of the risks, benefits, and alternatives to treatment. Questions regarding the procedure were encouraged and answered. The right neck and chest were prepped with chlorhexidine in a sterile fashion, and a sterile drape was applied covering the operative field. Maximum barrier sterile technique with sterile gowns and gloves were used for the procedure. A timeout was performed prior to the initiation of the procedure. Ultrasound survey was performed. Micropuncture kit was utilized to access the right internal jugular vein under direct, real-time ultrasound guidance after the overlying soft tissues were anesthetized with 1% lidocaine with epinephrine. Stab incision was made with 11 blade scalpel. Microwire was passed centrally. The microwire was then marked to measure appropriate internal catheter length. External tunneled length was estimated. A total tip to cuff length of 19 cm was selected. 035 guidewire was advanced to the level of the  IVC. Skin and subcutaneous tissues of chest wall below the clavicle were generously infiltrated with 1% lidocaine  for local anesthesia. A small stab incision was made with 11 blade scalpel. The selected hemodialysis catheter was tunneled in a retrograde fashion from the anterior chest wall to the venotomy incision. Serial dilation was performed and then a peel-away sheath was placed. The catheter was then placed through the peel-away sheath with tips ultimately positioned within the superior aspect of the right atrium. Final catheter positioning was confirmed and documented with a spot radiographic image. The catheter aspirates and flushes normally. The catheter was flushed with appropriate volume heparin dwells. The catheter exit site was secured with a 0-Prolene retention suture. Gel-Foam slurry was infused into the soft tissue tract. The venotomy incision was closed Derma bond and sterile dressing. Dressings were applied at the chest wall. Patient tolerated the procedure well and remained hemodynamically stable throughout. No complications were encountered and no significant blood loss encountered. IMPRESSION: Status post right IJ tunneled hemodialysis catheter placement. Catheter ready for use. Signed, Dulcy Fanny. Dellia Nims, RPVI [facility] Radiology Electronically Signed   By: Corrie Mckusick D.O.   On: 11/06/2019 10:13   IR US Guide Vasc Access Right  Result Date: 11/06/2019 INDICATION: 59 year old male referred for tunneled hemodialysis catheter EXAM: TUNNELED CENTRAL VENOUS HEMODIALYSIS CATHETER PLACEMENT WITH ULTRASOUND AND FLUOROSCOPIC GUIDANCE MEDICATIONS: 2 g Ancef. The antibiotic was given in an appropriate time interval prior to skin puncture. ANESTHESIA/SEDATION: Moderate (conscious) sedation was employed during this procedure. A total of Versed 0.5 mg and Fentanyl 0 mcg was administered intravenously. Moderate Sedation Time: 0 minutes. The patient's level of consciousness and vital signs were monitored continuously by radiology nursing throughout the procedure  under my direct supervision. FLUOROSCOPY TIME:  Fluoroscopy Time: 0 minutes 6 seconds (2 mGy). COMPLICATIONS: None PROCEDURE: Informed written consent was obtained from the patient after a discussion of the risks, benefits, and alternatives to treatment. Questions regarding the procedure were encouraged and answered. The right neck and chest were prepped with chlorhexidine in a sterile fashion, and a sterile drape was applied covering the operative field. Maximum barrier sterile technique with sterile gowns and gloves were used for the procedure. A timeout was performed prior to the initiation of the procedure. Ultrasound survey was performed. Micropuncture kit was utilized to access the right internal jugular vein under direct, real-time ultrasound guidance after the overlying soft tissues were anesthetized with 1% lidocaine with epinephrine. Stab incision was made with 11 blade scalpel. Microwire was passed centrally. The microwire was then marked to measure appropriate internal catheter length. External tunneled length was estimated. A total tip to cuff length of 19 cm was selected. 035 guidewire was advanced to the level of the IVC. Skin and subcutaneous tissues of chest wall below the clavicle were generously infiltrated with 1% lidocaine for local anesthesia. A small stab incision was made with 11 blade scalpel. The selected hemodialysis catheter was tunneled in a retrograde fashion from the anterior chest wall to the venotomy incision. Serial dilation was performed and then a peel-away sheath was placed. The catheter was then placed through the peel-away sheath with tips ultimately positioned within the superior aspect of the right atrium. Final catheter positioning was confirmed and documented with a spot radiographic image. The catheter aspirates and flushes normally. The catheter was flushed with appropriate volume heparin dwells. The catheter exit site was secured with a 0-Prolene retention suture.  Gel-Foam slurry was infused into the soft  tissue tract. The venotomy incision was closed Derma bond and sterile dressing. Dressings were applied at the chest wall. Patient tolerated the procedure well and remained hemodynamically stable throughout. No complications were encountered and no significant blood loss encountered. IMPRESSION: Status post right IJ tunneled hemodialysis catheter placement. Catheter ready for use. Signed, Dulcy Fanny. Dellia Nims, RPVI [facility] Radiology Special[facility] Radiology Electronically Signed   By: Corrie Mckusick D.O.   On: 11/06/2019 10:13    Labs: BMET Recent Labs  Lab 11/01/19 0456 11/01/19 1314 11/02/19 0613 11/04/19 0513 11/05/19 0724 11/06/19 0551 11/06/19 1238  NA 136  --  136 134* 136 131* 138  K 5.9* 4.5 4.5 4.3 4.1 4.1 4.4  CL 99  --  101 103 104 97* 106  CO2 25  --  23 20* 19* 27 23  GLUCOSE 134*  --  158* 177* 81 332* 183*  BUN 120*  --  111* 105* 100* 16 87*  CREATININE 5.64*  --  5.42* 5.98* 6.04* 0.96 5.19*  CALCIUM 9.6  --  9.5 9.6 9.2 8.7* 9.3  PHOS  --   --   --   --   --  3.0  --    CBC Recent Labs  Lab 11/02/19 0613 11/04/19 0959 11/05/19 0724 11/06/19 0230  WBC 7.1 8.2 8.1 7.1  NEUTROABS  --   --   --  3.9  HGB 10.1* 9.6* 9.8* 8.8*  HCT 32.7* 31.9* 32.7* 28.4*  MCV 84.5 85.3 84.9 85.3  PLT 370 367 375 356    Medications:    . amLODipine  5 mg Oral Daily  . aspirin EC  81 mg Oral Daily  . Chlorhexidine Gluconate Cloth  6 each Topical Q0600  . collagenase   Topical Daily  . docusate sodium  100 mg Oral BID  . gabapentin  300 mg Oral Daily   And  . gabapentin  600 mg Oral QHS  . heparin sodium (porcine)      . insulin aspart  0-15 Units Subcutaneous Q4H  . insulin aspart  7 Units Subcutaneous TID WC  . insulin glargine  20 Units Subcutaneous QHS  . multivitamin with minerals  1 tablet Oral Daily  . nutrition supplement (JUVEN)  1 packet Oral BID BM  . prochlorperazine  10 mg Intravenous QHS   . Ensure Max Protein  11 oz Oral BID  . sodium bicarbonate  650 mg Oral BID  . sodium chloride flush  3 mL Intravenous Q12H  . sodium polystyrene  10 g Oral Daily      [name], MD [facility] 11/06/2019, 1:51 PM

## 2019-11-06 NOTE — TOC Progression Note (Signed)
Transition of Care [facility]) - Progression Note    Patient Details  Name: Zachary Young MRN: [id number] Date of Birth: 07/15/1960  Transition of Care [facility]) CM/SW [place], [place] Phone Number: 11/06/2019, 3:19 PM  Clinical Narrative:    CSW spoke with [facility] leadership and [place] legal regarding next steps for pt on 7/22. Pt information was sent through hub and several offers were made. Per [place] legal pt family needs to contact his officer. RNCM Heather had spoken with pt sisters Webb Silversmith and [name]. [name] has been working with Research officer, trade union and provided her number and requested we give her a call. CSW has left HIPAA compliant message with Enis Slipper [phone]).   Pt will need to have a disposition in order for Renal Navigator to arrange OP HD. CSW has been in communication w/ [name], LCSW.    Expected Discharge Plan and Services Discharge Planning Services: CM Consult Living arrangements for the past 2 months: Single Family Home   Readmission Risk Interventions No flowsheet data found.

## 2019-11-06 NOTE — Progress Notes (Signed)
Physical Therapy Treatment Patient Details Name: Zachary Young MRN: [id number] DOB: January 25, 1961 Today's Date: 11/06/2019    History of Present Illness Pt is a 59 y/o male s/p R transtibial amputation. PMH including but not limited to CHF, DM, HTN, L transmet amputation. Pt now s/p HD catheter placement with plan for starting HD while admitted.     PT Comments    Pt agreeable to participate in R LE strengthening therex for s/p amputation and bed mobility. He stated that since he fell he does not want to perform transfers, but still does with RN staff to use BSC. Continue to recommend pt d/c to SNF for further therapy services prior to returning home alone as pt does not have any family that is willing to assist him at this time.     Follow Up Recommendations  SNF     Equipment Recommendations  Wheelchair (measurements PT);Wheelchair cushion (measurements PT);Other (comment) (with amputee attachement)    Recommendations for Other Services       Precautions / Restrictions Precautions Precautions: Fall Precaution Comments: R limb protector Restrictions Weight Bearing Restrictions: Yes RUE Weight Bearing: Non weight bearing RLE Weight Bearing: Non weight bearing    Mobility  Bed Mobility Overal bed mobility: Modified Independent                Transfers                 General transfer comment: pt declining, stating he still gets in and out of bed to use [name]  Ambulation/Gait                 Stairs             Wheelchair Mobility    Modified Rankin (Stroke Patients Only)       Balance Overall balance assessment: Needs assistance Sitting-balance support: No upper extremity supported;Feet supported Sitting balance-Leahy Scale: Fair                                      Cognition Arousal/Alertness: Awake/alert Behavior During Therapy: WFL for tasks assessed/performed Overall Cognitive Status: Impaired/Different from  baseline Area of Impairment: Safety/judgement;Problem solving                         Safety/Judgement: Decreased awareness of safety;Decreased awareness of deficits   Problem Solving: Requires verbal cues        Exercises Amputee Exercises Quad Sets: AROM;Right;10 reps;Supine Hip ABduction/ADduction: AROM;Right;10 reps;Supine Hip Flexion/Marching: AROM;Right;10 reps;Supine Knee Flexion: AROM;Right;10 reps;Supine Knee Extension: AROM;Right;10 reps;Supine Straight Leg Raises: AROM;Strengthening;Right;10 reps;Supine    General Comments        Pertinent Vitals/Pain Pain Assessment: No/denies pain    Home Living                      Prior Function            PT Goals (current goals can now be found in the care plan section) Acute Rehab PT Goals PT Goal Formulation: With patient Time For Goal Achievement: 11/17/19 Potential to Achieve Goals: Fair Progress towards PT goals: Progressing toward goals    Frequency    Min 3X/week      PT Plan Current plan remains appropriate    Co-evaluation              AM-PAC PT "6 Clicks" Mobility  Outcome Measure  Help needed turning from your back to your side while in a flat bed without using bedrails?: None Help needed moving from lying on your back to sitting on the side of a flat bed without using bedrails?: None Help needed moving to and from a bed to a chair (including a wheelchair)?: A Little Help needed standing up from a chair using your arms (e.g., wheelchair or bedside chair)?: A Little Help needed to walk in hospital room?: A Lot Help needed climbing 3-5 steps with a railing? : A Lot 6 Click Score: 18    End of Session   Activity Tolerance: Patient tolerated treatment well Patient left: in bed;with call bell/phone within reach (pt refusing bed alarm) Nurse Communication: Mobility status PT Visit Diagnosis: Other abnormalities of gait and mobility (R26.89)     Time: 1497-0263 PT  Time Calculation (min) (ACUTE ONLY): 15 min  Charges:  $Therapeutic Exercise: 8-22 mins                     Anastasio Champion, DPT  Acute Rehabilitation Services Pager [phone] Office [place] 11/06/2019, 10:00 AM

## 2019-11-06 NOTE — Progress Notes (Signed)
Pt very agitated. Made verbal threats to nurse and stated he did not feel like giving me his arm to flush IV  Or administer IV medication. Pt requested to have bed alarm taken off bed and also refused floor mats. Pt given safety education but he stated "he did not want to hear it."

## 2019-11-06 NOTE — Care Management (Signed)
Spoke to patient's sister [name] this morning. Asked [name] if she could speak with patient's parole officer to be sure patient can go to a SNF.   Patient's sister [name] called back with [facility] officer information [name] [phone] .   [name] RN

## 2019-11-07 DIAGNOSIS — Z515 Encounter for palliative care: Secondary | ICD-10-CM | POA: Diagnosis not present

## 2019-11-07 DIAGNOSIS — Z7189 Other specified counseling: Secondary | ICD-10-CM | POA: Diagnosis not present

## 2019-11-07 DIAGNOSIS — N186 End stage renal disease: Secondary | ICD-10-CM | POA: Diagnosis not present

## 2019-11-07 DIAGNOSIS — L02611 Cutaneous abscess of right foot: Secondary | ICD-10-CM | POA: Diagnosis not present

## 2019-11-07 LAB — BASIC METABOLIC PANEL
Anion gap: 10 (ref 5–15)
BUN: 75 mg/dL — ABNORMAL HIGH (ref 6–20)
CO2: 23 mmol/L (ref 22–32)
Calcium: 9.5 mg/dL (ref 8.9–10.3)
Chloride: 107 mmol/L (ref 98–111)
Creatinine, Ser: 4.98 mg/dL — ABNORMAL HIGH (ref 0.61–1.24)
GFR calc Af Amer: 14 mL/min — ABNORMAL LOW (ref >60)
GFR calc non Af Amer: 12 mL/min — ABNORMAL LOW (ref >60)
Glucose, Bld: 81 mg/dL (ref 70–99)
Potassium: 4.3 mmol/L (ref 3.5–5.1)
Sodium: 140 mmol/L (ref 135–145)

## 2019-11-07 LAB — HEPATITIS B SURFACE ANTIBODY, QUANTITATIVE: Hep B S AB Quant (Post): <3.1 m[IU]/mL — ABNORMAL LOW (ref >9.9)

## 2019-11-07 LAB — PTH, INTACT AND CALCIUM
Calcium, Total (PTH): 9.1 mg/dL (ref 8.7–10.2)
PTH: 22 pg/mL (ref 15–65)

## 2019-11-07 LAB — GLUCOSE, CAPILLARY
Glucose-Capillary: 110 mg/dL — ABNORMAL HIGH (ref 70–99)
Glucose-Capillary: 124 mg/dL — ABNORMAL HIGH (ref 70–99)
Glucose-Capillary: 136 mg/dL — ABNORMAL HIGH (ref 70–99)
Glucose-Capillary: 213 mg/dL — ABNORMAL HIGH (ref 70–99)
Glucose-Capillary: 99 mg/dL (ref 70–99)

## 2019-11-07 MED ORDER — HEPARIN SODIUM (PORCINE) 1000 UNIT/ML IJ SOLN
INTRAMUSCULAR | Status: AC
  Filled 2019-11-07: qty 4

## 2019-11-07 NOTE — Progress Notes (Signed)
PROGRESS NOTE  Zachary Young IDP:[id number] DOB: 09-Jan-1961 DOA: 10/17/2019 PCP: System, Pcp Not In  HPI/Recap of past 24 hours:  [name] PMHxCKD stage IV, type II diabetes with hyperglycemia, Diabetic Neuropathy, Chronic systolic and diastolic CHF, HTN. Presentedafter passing out. Zachary Young notes that he went out on to his front porch to get some fresh air. Once out there, he became dizzy and had to lean against the house, and then he passed out. He slid down the side of the house and did not hit his head. He noted no chest pain, no fever, no chills, no change in urination. He does note that he has had a decreased appetite for the last 3-4 days, eating only about half of what he normally does. He has passed out before in the past when his sugar drops, and he thinks this may have happened today. He denies chest pain. He has SOB with ambulation at times. He reports taking torsemide 80mg  in the morning and evening (4 tablets) and not missing doses. He was having issues with pain in bed, and as I looked at his legs, I noted that his right leg was swollen, warm and mildly erythematous compared to the left.  He noted a new wound on the plantar surface, at the 4th and 5th digit which he noticed first about 2 weeks ago.    Post right below the knee amputation on 10/21/2019 by Zachary Young.  Hospital course complicated by worsening renal function in the setting of CKD IV for which nephrology was consulted.  Has very poor insight into his medical condition.  His 2 sisters Zachary Young and Zachary Young are his medical decision makers.  Updated the patient's sister Ms. Zachary Young via phone.  Per his sister he is presently homeless.  He has difficulty understanding the severity of his medical condition.  Her along with her sister Zachary Young are making medical decisions for him.    Patient is on parole and his sister Zachary Young is working to lift it up in order to facilitate his placement to SNF.   Post right IJ TDC placement by  IR on 11/05/2019.  1st HD completed on 11/06/2019.  2nd HD planned on 11/07/2019.  Difficult placement, TOC assisting with SNF placement.  11/07/19: Seen and examined at his bedside.  Denies any nausea or vomiting post 1st session of hemodialysis on 11/06/2019.   He has no new complaints.  Planned 2nd session of hemodialysis today.  Assessment/Plan: Active Problems:   Hypertensive heart disease with CHF (congestive heart failure) (HCC)   Chronic kidney disease, stage 4 (severe) (HCC)   HLD (hyperlipidemia)   HTN (hypertension)   Systolic and diastolic CHF, chronic (HCC)   Tobacco use disorder   Type 2 diabetes mellitus with circulatory disorder, with long-term current use of insulin (HCC)   Wound of right foot   Syncope and collapse   Cocaine abuse ([place])   Subacute osteomyelitis, right ankle and foot (HCC)   Abscess of right foot   Diabetic neuropathy (HCC)   Anemia of chronic disease   Severe obesity (BMI 35.0-39.9) with comorbidity ([place])   Metabolic acidosis   DM (diabetes mellitus), secondary, uncontrolled, with complications (HCC)   Elevated sedimentation rate   Elevated C-reactive protein (CRP)   Hypoalbuminemia   Goals of care, counseling/discussion   Palliative care by specialist  Right foot wound status post BKA on 10/21/19 by Zachary Young Patient denies any pain currently.  Patient reports he fell on the residual limb and had some  bleeding on 7/21. Seen by ortho on 7/22, patient denies any wound dehiscence Ortho recommends to hold SQ lovenox until the hematoma has resolved.  Will follow up with Ortho as an outpatient. PT has recommended SNF, difficult placement.  Progressive CKD five, now ESRD  Presented with nonoliguric acute renal failure on chronic kidney disease stage IV, hyperkalemia, with evidence of early uremic symptoms Prior to admission followed by nephrology in [place].  Post right IJ TDC placement by IR Zachary Young, on 11/05/2019 1st session of hemodialysis on  11/06/2019 2nd session of hemodialysis planned on 11/07/2019 Will need HD spot prior to DC, TOC working on SNF placement Highly appreciate nephrology's assistance  Resolved intractable nausea and vomiting Suspect secondary to uremia in the setting of progressive CKD five  Resolved, now on hemodialysis  Diabetes mellitus type 2, uncontrolled with hyperglycemia and renal complications ZOX0R 8.8.  Continue Lantus 20 units nightly and NovoLog 7units tid with meals  Continue insulin sliding scale and avoid hypoglycemia Avoid hypoglycemia in the setting of new ESRD.  Syncope, no recurrence Likely multifactorial including overdiuresis and acute kidney injury and acute infection.  No further episodes in the hospital.  Continue to monitor  Chronic diastolic CHF EF 50 to 60% based on echocardiogram done in May.  Volume status addressed with hemodialysis Monitor daily weight  Essential hypertension Norvasc dose reduced to 5 mg daily to avoid hypotension BP is at goal Continue to monitor vital signs  Anemia of chronic kidney disease/acute blood loss-bled from his stump, resolved. Hemoglobin is 8.8 from 9.8  Acute blood loss anemia from bleeding stump 7/21, and 7/22. Seen by orthopedic surgery, Zachary Young, on 7/22, recommended to hold off Lovenox until hematoma has resolved Hg as reported above Continue to monitor H&H  History of cocaine abuse His urine drug screen from 7/4 was positive for cocaine Cessation counseling done at bedside  Obesity Estimated body mass index is 35.35 kg/m as calculated from the following: Height as of this encounter: 6\' 3"  (1.905 m). Weight as of this encounter: 128.3 kg.  Behavioral issues Patient has exhibited belligerent behavior with staff. He becomes verbally abusive. Appears to have very poor understanding of his multiple medical problems.  His sisters Zachary Young and Zachary Young are his medical decision makers.  Goals of care Seen by  palliative care previously.  Palliative care team reconsulted to assist with establishing goals of care, appreciate assistance. His sisters Zachary Young and Zachary Young are his medical decision makers. DNR as of 11/03/2019   DVT Prophylaxis:SCDs Code Status:DNR Family Communication:Updated his Sister Zachary Young via phone. Disposition Plan:Status is: Inpatient    Status is: Inpatient  Remains inpatient appropriate because:Inpatient level of care appropriate due to severity of illness   Dispo:             Patient From: Home             Planned Disposition: To be determined              Expected discharge date: 11/10/19             Medically not stable for discharge due to not ESRD on hemodialysis requiring an HD spot prior to discharge.       Objective: Vitals:   11/07/19 0005 11/07/19 0457 11/07/19 0821 11/07/19 1203  BP: 124/74 (!) 156/82 (!) 155/76 127/74  Pulse: 79 89 88 88  Resp: 19 18  16   Temp: 99.1 F (37.3 C) 98.1 F (36.7 C)  98.3 F (36.8 C)  TempSrc:  Oral Oral  Oral  SpO2: 98% 98%  100%  Weight:  (!) 128 kg    Height:        Intake/Output Summary (Last 24 hours) at 11/07/2019 1522 Last data filed at 11/07/2019 1120 Gross per 24 hour  Intake 600 ml  Output 1100 ml  Net -500 ml   Filed Weights   11/06/19 0424 11/06/19 1223 11/07/19 0457  Weight: (!) 128.9 kg (!) 128.9 kg (!) 128 kg    Exam:  . General: 59 y.o. year-old male please no acute distress.  Alert and oriented x3.  . Cardiovascular: Regular rate and rhythm no rubs or gallops. Marland Kitchen Respiratory: Clear to auscultation no wheezes or rales.   . Abdomen: Obese nontender normal bowel sounds present. . Musculoskeletal: Right below the knee amputation.  Improved trace lower extremity edema and left lower extremity.  Marland Kitchen Psychiatry: Mood is appropriate for condition and setting.   Data Reviewed: CBC: Recent Labs  Lab 11/01/19 0444 11/02/19 0613 11/04/19 0959 11/05/19 0724 11/06/19 0230  WBC 7.3 7.1  8.2 8.1 7.1  NEUTROABS  --   --   --   --  3.9  HGB 10.3* 10.1* 9.6* 9.8* 8.8*  HCT 33.8* 32.7* 31.9* 32.7* 28.4*  MCV 86.4 84.5 85.3 84.9 85.3  PLT 409* 370 367 375 595   Basic Metabolic Panel: Recent Labs  Lab 11/05/19 0724 11/05/19 0724 11/06/19 0551 11/06/19 1238 11/06/19 1645 11/07/19 0218  NA 136  --  131* 138 136 140  K 4.1  --  4.1 4.4 4.4 4.3  CL 104  --  97* 106 105 107  CO2 19*  --  27 23 19* 23  GLUCOSE 81  --  332* 183* 182* 81  BUN 100*  --  16 87* 88* 75*  CREATININE 6.04*  --  0.96 5.19* 5.27* 4.98*  CALCIUM 9.2   < > 8.7* 9.3  9.1 9.2 9.5  PHOS  --   --  3.0  --  5.2*  --    < > = values in this interval not displayed.   GFR: Estimated Creatinine Clearance: 23.6 mL/min (A) (by C-G formula based on SCr of 4.98 mg/dL (H)). Liver Function Tests: Recent Labs  Lab 11/06/19 0551 11/06/19 1645  ALBUMIN 2.6* 2.5*   No results for input(s): LIPASE, AMYLASE in the last 168 hours. No results for input(s): AMMONIA in the last 168 hours. Coagulation Profile: Recent Labs  Lab 11/05/19 1221  INR 1.0   Cardiac Enzymes: No results for input(s): CKTOTAL, CKMB, CKMBINDEX, TROPONINI in the last 168 hours. BNP (last 3 results) No results for input(s): PROBNP in the last 8760 hours. HbA1C: No results for input(s): HGBA1C in the last 72 hours. CBG: Recent Labs  Lab 11/06/19 1624 11/06/19 2015 11/07/19 0005 11/07/19 0404 11/07/19 1057  GLUCAP 182* 187* 99 110* 213*   Lipid Profile: No results for input(s): CHOL, HDL, LDLCALC, TRIG, CHOLHDL, LDLDIRECT in the last 72 hours. Thyroid Function Tests: No results for input(s): TSH, T4TOTAL, FREET4, T3FREE, THYROIDAB in the last 72 hours. Anemia Panel: Recent Labs    11/05/19 1933 11/06/19 1238 11/06/19 1645  VITAMINB12  --  753  --   FOLATE 13.3  --   --   FERRITIN  --  86  --   TIBC  --  265  --   IRON  --  28*  --   RETICCTPCT  --   --  2.0   Urine analysis:  Component Value Date/Time    COLORURINE YELLOW 08/15/2019 1538   APPEARANCEUR CLEAR 08/15/2019 1538   LABSPEC 1.010 08/15/2019 1538   PHURINE 5.0 08/15/2019 1538   GLUCOSEU NEGATIVE 08/15/2019 1538   HGBUR SMALL (A) 08/15/2019 1538   BILIRUBINUR NEGATIVE 08/15/2019 1538   KETONESUR NEGATIVE 08/15/2019 1538   PROTEINUR >=300 (A) 08/15/2019 1538   NITRITE NEGATIVE 08/15/2019 1538   LEUKOCYTESUR NEGATIVE 08/15/2019 1538   Sepsis Labs: @LABRCNTIP (procalcitonin:4,lacticidven:4)  )No results found for this or any previous visit (from the past 240 hour(s)).    Studies: No results found.  Scheduled Meds: . amLODipine  5 mg Oral Daily  . aspirin EC  81 mg Oral Daily  . Chlorhexidine Gluconate Cloth  6 each Topical Q0600  . collagenase   Topical Daily  . docusate sodium  100 mg Oral BID  . gabapentin  300 mg Oral Daily   And  . gabapentin  600 mg Oral QHS  . insulin aspart  0-15 Units Subcutaneous Q4H  . insulin aspart  7 Units Subcutaneous TID WC  . insulin glargine  20 Units Subcutaneous QHS  . multivitamin with minerals  1 tablet Oral Daily  . nutrition supplement (JUVEN)  1 packet Oral BID BM  . prochlorperazine  10 mg Intravenous QHS  . Ensure Max Protein  11 oz Oral BID  . sodium chloride flush  3 mL Intravenous Q12H  . sodium polystyrene  10 g Oral Daily    Continuous Infusions: . [START ON 11/09/2019] ferric gluconate (FERRLECIT/NULECIT) IV       LOS: 21 days     Kayleen Memos, MD [facility] Pager [phone]  If 7PM-7AM, please contact night-coverage [email or web address] Password [facility] 11/07/2019, 3:22 PM

## 2019-11-07 NOTE — Progress Notes (Signed)
Daily Progress Note   Patient Name: Zachary Young       Date: 11/07/2019 DOB: 1960-09-25  Age: 59 y.o. MRN#: [id number] Attending Physician: Kayleen Memos, DO Primary Care Physician: System, Pcp Not In Admit Date: 10/17/2019  Reason for Consultation/Follow-up: continued GOC discussions  HPI/Patient Profile: 59 y.o. male  with past medical history of CKD V, poorly controlled T2DM, diabetic neuropathy, CHF, HTN, and substance abuse admitted on 10/17/2019 with syncopal episode. Found to have a new wound on plantar surface of right foot. Patient had Right BKA on 7/7. PMT consulted for [place].  Subjective: Patient sitting up in bed eating a meal. We discuss he had his first dialysis treatment yesterday. I asked how this went, and he states "ok" but does report feeling fatigued afterward. I asked if he wanted to have dialysis or if he felt "pushed" into it by family. He admits that his family pushed him. Discussed that he will likely need this long-term, and the compliance is essential. Discussed that without dialysis, he would die within 2 weeks. Patient verbalized that he understands this, and that "I don't want to talk about dying anymore".  Discussed the importance of ongoing discussions with the medical team regarding treatment decisions. Discussed that he can make the decision to stop dialysis at any time, if he wants to pursue hospice and focus on comfort. Patient verbalizes understanding, and states he is "ok with doing dialysis for now" Patient then changes the subject and tells me that he wants to go outside. [place] and he loves spending time outside. I told him I would try to help with him being able to go outside for a short time.   I later spoke  with patient's sister Webb Silversmith. She shares with me that [name] "talked him into doing dialysis". Discussed the concern that [place] is not a good candidate for long term dialysis due to his heart issues and compliance issues. Webb Silversmith agrees that compliance will be particularly challenging for [place]. She does state that she and other siblings will be willing and help with transportation if the need arises.    Length of Stay: 21  Current Medications: Scheduled Meds:   Physical Exam Constitutional:      General: He is not in acute distress.  Appearance: He is obese.  Pulmonary:     Effort: Pulmonary effort is normal.  Neurological:     Mental Status: He is alert.             Vital Signs: BP 127/74 (BP Location: Left Arm)   Pulse 88   Temp 98.3 F (36.8 C) (Oral)   Resp 16   Ht 6\' 3"  (1.905 m)   Wt (!) 128 kg   SpO2 100%   BMI 35.27 kg/m  SpO2: SpO2: 100 % O2 Device: O2 Device: Room Air  Intake/output summary:   Intake/Output Summary (Last 24 hours) at 11/07/2019 1715 Last data filed at 11/07/2019 1120 Gross per 24 hour  Intake 600 ml  Output 1100 ml  Net -500 ml   LBM: Last BM Date: 11/06/19 Baseline Weight: Weight: 135.4 kg Most recent weight: Weight: (!) 128 kg       Palliative Assessment/Data: 40%      Patient Active Problem List   Diagnosis Date Noted  . Goals of care, counseling/discussion   . Palliative care by specialist   . Abscess of right foot 10/21/2019  . Diabetic neuropathy ([place]) 10/21/2019  . Anemia of chronic disease 10/21/2019  . Severe obesity (BMI 35.0-39.9) with comorbidity ([place]) 10/21/2019  . Metabolic acidosis 59/93/5701  . DM (diabetes mellitus), secondary, uncontrolled, with complications ([place]) 77/93/9030  . Elevated sedimentation rate 10/21/2019  . Elevated C-reactive protein (CRP) 10/21/2019  . Hypoalbuminemia 10/21/2019  . Subacute osteomyelitis, right ankle and foot ([place])   . Cocaine abuse ([place]) 10/19/2019  . Wound of right foot  10/17/2019  . Acute kidney injury superimposed on chronic kidney disease ([place])   . Syncope and collapse   . Dyspnea   . Abdominal pain   . Hypertensive heart disease with CHF (congestive heart failure) ([place]) 08/15/2019  . Acute hypoxemic respiratory failure ([place])   . Anasarca 06/17/2019  . Gastritis 05/26/2019  . GI bleed 12/14/2018  . Chronic kidney disease, stage 4 (severe) ([place]) 06/26/2018  . Systolic and diastolic CHF, chronic ([place]) 06/26/2018  . Moderate nonproliferative retinopathy due to secondary diabetes ([place]) 04/30/2018  . HLD (hyperlipidemia) 02/28/2018  . History of MI (myocardial infarction) 05/22/2016  . History of osteomyelitis 05/22/2016  . Tobacco use disorder 03/27/2016  . HTN (hypertension) 03/26/2016  . Type 2 diabetes mellitus with circulatory disorder, with long-term current use of insulin ([place]) 03/26/2016  . Toe amputation status 03/17/2014    Palliative Care Assessment & Plan   Recommendations/Plan: - DNR/DNI - patient may go outside with supervision (care order entered) - palliative care will continue to follow  Code Status: Prognosis:   Unable to determine  Discharge Planning:  To Be Determined  Care plan was discussed with bedside RN  Thank you for allowing the Palliative Medicine Team to assist in the care of this patient.   Total Time 35  Prolonged Time Billed  no       Greater than 50%  of this time was spent counseling and coordinating care related to the above assessment and plan.  Lavena Bullion, NP  Please contact Palliative Medicine Team phone at [phone] for questions and concerns.

## 2019-11-07 NOTE — Progress Notes (Signed)
[facility] Progress Note    Assessment/ Plan:   1. Progressive CKD5, now ESRD with evidence of early uremic symptoms. [place] placed (7/23), started hemodialysis 7/23 -HD start (#1) 7/23, #2 treatment today - His disposition is currently unclear but is currently being worked on especially to find him a place to stay and sort out his parole.  Have discussed this with our renal navigator, will continue to follow along -Renal diet -Avoid nephrotoxic medications including NSAIDs and iodinated intravenous contrast exposure unless the latter is absolutely indicated.  Preferred narcotic agents for pain control are hydromorphone, fentanyl, and methadone. Morphine should not be used. Avoid Baclofen and avoid oral sodium phosphate and magnesium citrate based laxatives / bowel preps. Continue strict Input and Output monitoring. Will monitor the patient closely with you and intervene or adjust therapy as indicated by changes in clinical status/labs  2. Metabolic acidosis, will sotp nahco3 given that he is on hd 3. Chronic diastolic and systolic heart failure, stable 4. Cocaine use 5. Anemia of chronic kidney disease -ferric gluconate to start Monday 7/26 6. Homeless 7. Diabetes mellitus type 2  Subjective:   Tolerated dialysis yesterday (first treatment), to return again for hd. No acute events, patient does not report any complaints.   Objective:   BP 127/74 (BP Location: Left Arm)   Pulse 88   Temp 98.3 F (36.8 C) (Oral)   Resp 16   Ht 6' 3"  (1.905 m)   Wt (!) 128 kg   SpO2 100%   BMI 35.27 kg/m   Intake/Output Summary (Last 24 hours) at 11/07/2019 1316 Last data filed at 11/07/2019 1120 Gross per 24 hour  Intake 600 ml  Output 1038 ml  Net -438 ml   Weight change: 0 kg  Physical Exam: Gen: No acute distress CVS: Regular rate Resp: Clear to auscultation bilaterally Abd: Obese, nontender Ext: Left lower extremity edema, right lower extremity amputation Neuro: no  asterixis RIJ tunneled dialysis catheter  Imaging: IR Fluoro Guide CV Line Right  Result Date: 11/06/2019 INDICATION: 59 year old male referred for tunneled hemodialysis catheter EXAM: TUNNELED CENTRAL VENOUS HEMODIALYSIS CATHETER PLACEMENT WITH ULTRASOUND AND FLUOROSCOPIC GUIDANCE MEDICATIONS: 2 g Ancef. The antibiotic was given in an appropriate time interval prior to skin puncture. ANESTHESIA/SEDATION: Moderate (conscious) sedation was employed during this procedure. A total of Versed 0.5 mg and Fentanyl 0 mcg was administered intravenously. Moderate Sedation Time: 0 minutes. The patient's level of consciousness and vital signs were monitored continuously by radiology nursing throughout the procedure under my direct supervision. FLUOROSCOPY TIME:  Fluoroscopy Time: 0 minutes 6 seconds (2 mGy). COMPLICATIONS: None PROCEDURE: Informed written consent was obtained from the patient after a discussion of the risks, benefits, and alternatives to treatment. Questions regarding the procedure were encouraged and answered. The right neck and chest were prepped with chlorhexidine in a sterile fashion, and a sterile drape was applied covering the operative field. Maximum barrier sterile technique with sterile gowns and gloves were used for the procedure. A timeout was performed prior to the initiation of the procedure. Ultrasound survey was performed. Micropuncture kit was utilized to access the right internal jugular vein under direct, real-time ultrasound guidance after the overlying soft tissues were anesthetized with 1% lidocaine with epinephrine. Stab incision was made with 11 blade scalpel. Microwire was passed centrally. The microwire was then marked to measure appropriate internal catheter length. External tunneled length was estimated. A total tip to cuff length of 19 cm was selected. 035 guidewire was advanced to the  level of the IVC. Skin and subcutaneous tissues of chest wall below the clavicle were  generously infiltrated with 1% lidocaine for local anesthesia. A small stab incision was made with 11 blade scalpel. The selected hemodialysis catheter was tunneled in a retrograde fashion from the anterior chest wall to the venotomy incision. Serial dilation was performed and then a peel-away sheath was placed. The catheter was then placed through the peel-away sheath with tips ultimately positioned within the superior aspect of the right atrium. Final catheter positioning was confirmed and documented with a spot radiographic image. The catheter aspirates and flushes normally. The catheter was flushed with appropriate volume heparin dwells. The catheter exit site was secured with a 0-Prolene retention suture. Gel-Foam slurry was infused into the soft tissue tract. The venotomy incision was closed Derma bond and sterile dressing. Dressings were applied at the chest wall. Patient tolerated the procedure well and remained hemodynamically stable throughout. No complications were encountered and no significant blood loss encountered. IMPRESSION: Status post right IJ tunneled hemodialysis catheter placement. Catheter ready for use. Signed, Dulcy Fanny. Dellia Nims, RPVI [facility] Electronically Signed   By: Corrie Mckusick D.O.   On: 11/06/2019 10:13   IR US Guide Vasc Access Right  Result Date: 11/06/2019 INDICATION: 59 year old male referred for tunneled hemodialysis catheter EXAM: TUNNELED CENTRAL VENOUS HEMODIALYSIS CATHETER PLACEMENT WITH ULTRASOUND AND FLUOROSCOPIC GUIDANCE MEDICATIONS: 2 g Ancef. The antibiotic was given in an appropriate time interval prior to skin puncture. ANESTHESIA/SEDATION: Moderate (conscious) sedation was employed during this procedure. A total of Versed 0.5 mg and Fentanyl 0 mcg was administered intravenously. Moderate Sedation Time: 0 minutes. The patient's level of consciousness and vital signs were monitored continuously by  radiology nursing throughout the procedure under my direct supervision. FLUOROSCOPY TIME:  Fluoroscopy Time: 0 minutes 6 seconds (2 mGy). COMPLICATIONS: None PROCEDURE: Informed written consent was obtained from the patient after a discussion of the risks, benefits, and alternatives to treatment. Questions regarding the procedure were encouraged and answered. The right neck and chest were prepped with chlorhexidine in a sterile fashion, and a sterile drape was applied covering the operative field. Maximum barrier sterile technique with sterile gowns and gloves were used for the procedure. A timeout was performed prior to the initiation of the procedure. Ultrasound survey was performed. Micropuncture kit was utilized to access the right internal jugular vein under direct, real-time ultrasound guidance after the overlying soft tissues were anesthetized with 1% lidocaine with epinephrine. Stab incision was made with 11 blade scalpel. Microwire was passed centrally. The microwire was then marked to measure appropriate internal catheter length. External tunneled length was estimated. A total tip to cuff length of 19 cm was selected. 035 guidewire was advanced to the level of the IVC. Skin and subcutaneous tissues of chest wall below the clavicle were generously infiltrated with 1% lidocaine for local anesthesia. A small stab incision was made with 11 blade scalpel. The selected hemodialysis catheter was tunneled in a retrograde fashion from the anterior chest wall to the venotomy incision. Serial dilation was performed and then a peel-away sheath was placed. The catheter was then placed through the peel-away sheath with tips ultimately positioned within the superior aspect of the right atrium. Final catheter positioning was confirmed and documented with a spot radiographic image. The catheter aspirates and flushes normally. The catheter was flushed with appropriate volume heparin dwells. The catheter exit site was  secured with a 0-Prolene retention suture. Gel-Foam slurry was infused  into the soft tissue tract. The venotomy incision was closed Derma bond and sterile dressing. Dressings were applied at the chest wall. Patient tolerated the procedure well and remained hemodynamically stable throughout. No complications were encountered and no significant blood loss encountered. IMPRESSION: Status post right IJ tunneled hemodialysis catheter placement. Catheter ready for use. Signed, Dulcy Fanny. Dellia Nims, RPVI [facility] Electronically Signed   By: Corrie Mckusick D.O.   On: 11/06/2019 10:13    Labs: BMET Recent Labs  Lab 11/02/19 9038 11/02/19 3338 11/04/19 0513 11/05/19 0724 11/06/19 0551 11/06/19 1238 11/06/19 1645 11/07/19 0218  NA 136  --  134* 136 131* 138 136 140  K 4.5  --  4.3 4.1 4.1 4.4 4.4 4.3  CL 101  --  103 104 97* 106 105 107  CO2 23  --  20* 19* 27 23 19* 23  GLUCOSE 158*  --  177* 81 332* 183* 182* 81  BUN 111*  --  105* 100* 16 87* 88* 75*  CREATININE 5.42*  --  5.98* 6.04* 0.96 5.19* 5.27* 4.98*  CALCIUM 9.5   < > 9.6 9.2 8.7* 9.3  9.1 9.2 9.5  PHOS  --   --   --   --  3.0  --  5.2*  --    < > = values in this interval not displayed.   CBC Recent Labs  Lab 11/02/19 0613 11/04/19 0959 11/05/19 0724 11/06/19 0230  WBC 7.1 8.2 8.1 7.1  NEUTROABS  --   --   --  3.9  HGB 10.1* 9.6* 9.8* 8.8*  HCT 32.7* 31.9* 32.7* 28.4*  MCV 84.5 85.3 84.9 85.3  PLT 370 367 375 356    Medications:    . amLODipine  5 mg Oral Daily  . aspirin EC  81 mg Oral Daily  . Chlorhexidine Gluconate Cloth  6 each Topical Q0600  . collagenase   Topical Daily  . docusate sodium  100 mg Oral BID  . gabapentin  300 mg Oral Daily   And  . gabapentin  600 mg Oral QHS  . insulin aspart  0-15 Units Subcutaneous Q4H  . insulin aspart  7 Units Subcutaneous TID WC  . insulin glargine  20 Units Subcutaneous QHS  . multivitamin with minerals  1  tablet Oral Daily  . nutrition supplement (JUVEN)  1 packet Oral BID BM  . prochlorperazine  10 mg Intravenous QHS  . Ensure Max Protein  11 oz Oral BID  . sodium chloride flush  3 mL Intravenous Q12H  . sodium polystyrene  10 g Oral Daily      [name], MD [facility] 11/07/2019, 1:16 PM

## 2019-11-08 DIAGNOSIS — Z515 Encounter for palliative care: Secondary | ICD-10-CM | POA: Diagnosis not present

## 2019-11-08 DIAGNOSIS — L02611 Cutaneous abscess of right foot: Secondary | ICD-10-CM | POA: Diagnosis not present

## 2019-11-08 DIAGNOSIS — N184 Chronic kidney disease, stage 4 (severe): Secondary | ICD-10-CM | POA: Diagnosis not present

## 2019-11-08 DIAGNOSIS — I5042 Chronic combined systolic (congestive) and diastolic (congestive) heart failure: Secondary | ICD-10-CM | POA: Diagnosis not present

## 2019-11-08 DIAGNOSIS — Z7189 Other specified counseling: Secondary | ICD-10-CM | POA: Diagnosis not present

## 2019-11-08 LAB — GLUCOSE, CAPILLARY
Glucose-Capillary: 119 mg/dL — ABNORMAL HIGH (ref 70–99)
Glucose-Capillary: 127 mg/dL — ABNORMAL HIGH (ref 70–99)
Glucose-Capillary: 144 mg/dL — ABNORMAL HIGH (ref 70–99)
Glucose-Capillary: 166 mg/dL — ABNORMAL HIGH (ref 70–99)
Glucose-Capillary: 170 mg/dL — ABNORMAL HIGH (ref 70–99)
Glucose-Capillary: 189 mg/dL — ABNORMAL HIGH (ref 70–99)

## 2019-11-08 LAB — BASIC METABOLIC PANEL
Anion gap: 10 (ref 5–15)
BUN: 63 mg/dL — ABNORMAL HIGH (ref 6–20)
CO2: 22 mmol/L (ref 22–32)
Calcium: 8.9 mg/dL (ref 8.9–10.3)
Chloride: 104 mmol/L (ref 98–111)
Creatinine, Ser: 4.19 mg/dL — ABNORMAL HIGH (ref 0.61–1.24)
GFR calc Af Amer: 17 mL/min — ABNORMAL LOW (ref >60)
GFR calc non Af Amer: 15 mL/min — ABNORMAL LOW (ref >60)
Glucose, Bld: 175 mg/dL — ABNORMAL HIGH (ref 70–99)
Potassium: 4.4 mmol/L (ref 3.5–5.1)
Sodium: 136 mmol/L (ref 135–145)

## 2019-11-08 MED ORDER — OXYCODONE-ACETAMINOPHEN 5-325 MG PO TABS
1.0000 | ORAL_TABLET | Freq: Three times a day (TID) | ORAL | Status: DC | PRN
  Administered 2019-11-08 – 2019-11-09 (×3): 1 via ORAL
  Filled 2019-11-08 (×3): qty 1

## 2019-11-08 MED ORDER — HYDROMORPHONE HCL 1 MG/ML IJ SOLN: 0.5000 mg | Freq: Two times a day (BID) | INTRAMUSCULAR | Status: DC | PRN

## 2019-11-08 MED ORDER — INSULIN ASPART 100 UNIT/ML ~~LOC~~ SOLN
4.0000 [IU] | Freq: Three times a day (TID) | SUBCUTANEOUS | Status: DC
  Administered 2019-11-08 – 2019-12-04 (×73): 4 [IU] via SUBCUTANEOUS

## 2019-11-08 MED ORDER — GABAPENTIN 100 MG PO CAPS: 100.0000 mg | ORAL_CAPSULE | Freq: Every day | ORAL | Status: DC

## 2019-11-08 MED ORDER — INSULIN ASPART 100 UNIT/ML ~~LOC~~ SOLN
0.0000 [IU] | Freq: Three times a day (TID) | SUBCUTANEOUS | Status: DC
  Administered 2019-11-08: 2 [IU] via SUBCUTANEOUS
  Administered 2019-11-08: 1 [IU] via SUBCUTANEOUS
  Administered 2019-11-09: 3 [IU] via SUBCUTANEOUS
  Administered 2019-11-09 (×2): 2 [IU] via SUBCUTANEOUS
  Administered 2019-11-10 (×2): 1 [IU] via SUBCUTANEOUS
  Administered 2019-11-10: 2 [IU] via SUBCUTANEOUS
  Administered 2019-11-11 (×2): 1 [IU] via SUBCUTANEOUS
  Administered 2019-11-11: 2 [IU] via SUBCUTANEOUS
  Administered 2019-11-12: 1 [IU] via SUBCUTANEOUS
  Administered 2019-11-13: 7 [IU] via SUBCUTANEOUS
  Administered 2019-11-14 (×3): 5 [IU] via SUBCUTANEOUS
  Administered 2019-11-14 – 2019-11-16 (×4): 2 [IU] via SUBCUTANEOUS
  Administered 2019-11-16: 3 [IU] via SUBCUTANEOUS
  Administered 2019-11-17: 2 [IU] via SUBCUTANEOUS
  Administered 2019-11-17 – 2019-11-18 (×5): 1 [IU] via SUBCUTANEOUS
  Administered 2019-11-19 (×2): 2 [IU] via SUBCUTANEOUS
  Administered 2019-11-19 – 2019-11-20 (×3): 1 [IU] via SUBCUTANEOUS
  Administered 2019-11-20: 2 [IU] via SUBCUTANEOUS
  Administered 2019-11-21: 3 [IU] via SUBCUTANEOUS
  Administered 2019-11-21 (×2): 2 [IU] via SUBCUTANEOUS
  Administered 2019-11-21: 1 [IU] via SUBCUTANEOUS
  Administered 2019-11-22 (×2): 2 [IU] via SUBCUTANEOUS
  Administered 2019-11-23 (×2): 3 [IU] via SUBCUTANEOUS
  Administered 2019-11-23 (×2): 2 [IU] via SUBCUTANEOUS
  Administered 2019-11-24: 1 [IU] via SUBCUTANEOUS
  Administered 2019-11-24 – 2019-11-26 (×5): 2 [IU] via SUBCUTANEOUS
  Administered 2019-11-26: 1 [IU] via SUBCUTANEOUS
  Administered 2019-11-27: 2 [IU] via SUBCUTANEOUS
  Administered 2019-11-27: 1 [IU] via SUBCUTANEOUS
  Administered 2019-11-27 – 2019-11-28 (×2): 2 [IU] via SUBCUTANEOUS
  Administered 2019-11-28: 3 [IU] via SUBCUTANEOUS
  Administered 2019-11-28: 2 [IU] via SUBCUTANEOUS
  Administered 2019-11-29: 1 [IU] via SUBCUTANEOUS
  Administered 2019-11-29: 2 [IU] via SUBCUTANEOUS
  Administered 2019-11-29: 3 [IU] via SUBCUTANEOUS
  Administered 2019-11-30 – 2019-12-02 (×7): 2 [IU] via SUBCUTANEOUS
  Administered 2019-12-02: 3 [IU] via SUBCUTANEOUS
  Administered 2019-12-02: 4 [IU] via SUBCUTANEOUS
  Administered 2019-12-03 – 2019-12-04 (×2): 3 [IU] via SUBCUTANEOUS
  Administered 2019-12-04: 2 [IU] via SUBCUTANEOUS
  Administered 2019-12-04 – 2019-12-05 (×2): 5 [IU] via SUBCUTANEOUS
  Administered 2019-12-05 (×2): 2 [IU] via SUBCUTANEOUS
  Administered 2019-12-06: 3 [IU] via SUBCUTANEOUS
  Administered 2019-12-06: 7 [IU] via SUBCUTANEOUS
  Administered 2019-12-06: 12 [IU] via SUBCUTANEOUS

## 2019-11-08 MED ORDER — GABAPENTIN 100 MG PO CAPS
100.0000 mg | ORAL_CAPSULE | Freq: Two times a day (BID) | ORAL | Status: DC
  Administered 2019-11-08: 100 mg via ORAL
  Filled 2019-11-08 (×3): qty 1

## 2019-11-08 MED ORDER — INSULIN GLARGINE 100 UNIT/ML ~~LOC~~ SOLN
10.0000 [IU] | Freq: Every day | SUBCUTANEOUS | Status: DC
  Administered 2019-11-08: 10 [IU] via SUBCUTANEOUS
  Filled 2019-11-08: qty 0.1

## 2019-11-08 NOTE — Progress Notes (Addendum)
Report from HD RN (Obas, [place]), patient tolerated HD. Removal of 748cc. Although goal is to remove 1500cc, per HD rn, pt's b/p drop into the 36U systolic. Unable to continue with HD at this time due to low b/p.   VSS upon returning from HD, pt denied pain or any distress, CBG 127. Pt request to have his HS snack and to sleep with shirt off at this time.

## 2019-11-08 NOTE — Progress Notes (Signed)
   11/07/19 2353  Hand-Off documentation  Handoff Given Given to shift RN/LPN  Report given to (Full Name) Haynes Bast, RN  Handoff Received Received from shift RN/LPN  Report received from (Full Name) Sedric Guia  Vital Signs  Temp 98.2 F (36.8 C)  Temp Source Oral  Pulse Rate 80  Pulse Rate Source Monitor  Resp 18  BP 102/69  BP Location Right Arm  BP Method Automatic  Patient Position (if appropriate) Lying  Oxygen Therapy  SpO2 98 %  O2 Device Room Air  Pain Assessment  Pain Scale 0-10  Pain Score 0  Post-Hemodialysis Assessment  Rinseback Volume (mL) 250 mL  KECN 140 V  Dialyzer Clearance Lightly streaked  Duration of HD Treatment -hour(s) 2 hour(s)  Hemodialysis Intake (mL) 550 mL  UF Total -Machine (mL) 748 mL  Net UF (mL) 198 mL  Tolerated HD Treatment Yes  Post-Hemodialysis Comments tx achieved as expected. BP low in 90's . no complaints, pt stable.  AVG/AVF Arterial Site Held (minutes) 0 minutes  AVG/AVF Venous Site Held (minutes) 0 minutes  Education / Care Plan  Dialysis Education Provided Yes  Hemodialysis Catheter Right Internal jugular Double lumen Permanent (Tunneled)  Placement Date/Time: 11/05/19 1737   Placed prior to admission: No  Time Out: Correct patient;Correct site;Correct procedure  Maximum sterile barrier precautions: Hand hygiene;Cap;Mask;Sterile gown;Sterile gloves;Large sterile sheet  Site Prep: Chlorh...  Site Condition No complications  Blue Lumen Status Heparin locked;Capped (Central line)  Red Lumen Status Heparin locked;Capped (Central line)  Catheter fill solution Heparin 1000 units/ml  Catheter fill volume (Arterial) 1.6 cc  Catheter fill volume (Venous) 1.6  Dressing Type Occlusive  Dressing Status Clean;Dry;Intact;Antimicrobial disc in place  Drainage Description None

## 2019-11-08 NOTE — Progress Notes (Signed)
[facility] Progress Note    Assessment/ Plan:   1. Progressive CKD5, now ESRD with evidence of early uremic symptoms. [place] placed (7/23), started hemodialysis 7/23 -no indication for renal replacement therapy today, will plan for HD#3 tomorrow, patient agreeable - His disposition is currently unclear but is currently being worked on especially to find him a place to stay and sort out his parole.  Have discussed this with our renal navigator, will continue to follow along -Renal diet -Avoid nephrotoxic medications including NSAIDs and iodinated intravenous contrast exposure unless the latter is absolutely indicated.  Preferred narcotic agents for pain control are hydromorphone, fentanyl, and methadone. Morphine should not be used. Avoid Baclofen and avoid oral sodium phosphate and magnesium citrate based laxatives / bowel preps. Continue strict Input and Output monitoring. Will monitor the patient closely with you and intervene or adjust therapy as indicated by changes in clinical status/labs  2. Metabolic acidosis, stopped nahco3 given that he is on hd 3. Chronic diastolic and systolic heart failure, stable 4. Cocaine use 5. Anemia of chronic kidney disease -ferric gluconate to start Monday 7/26 6. Homeless 7. Diabetes mellitus type 2  Subjective:   Tolerated dialysis yesterday second treatment). Reports nausea is better   Objective:   BP (!) 134/75 (BP Location: Right Arm)   Pulse 78   Temp 97.6 F (36.4 C) (Oral)   Resp 18   Ht 6\' 3"  (1.905 m)   Wt (!) 128 kg   SpO2 100%   BMI 35.27 kg/m   Intake/Output Summary (Last 24 hours) at 11/08/2019 1446 Last data filed at 11/08/2019 1255 Gross per 24 hour  Intake 1200 ml  Output 698 ml  Net 502 ml   Weight change:   Physical Exam: Gen: No acute distress CVS: Regular rate Resp: normal wob Abd: Obese, nontender Ext: Left lower extremity edema, right lower extremity amputation Neuro: no focal deficits RIJ  tunneled dialysis catheter: c/d/i  Imaging: No results found.  Labs: BMET Recent Labs  Lab 11/04/19 0513 11/04/19 0513 11/05/19 0724 11/06/19 0551 11/06/19 1238 11/06/19 1645 11/07/19 0218 11/08/19 0442  NA 134*  --  136 131* 138 136 140 136  K 4.3  --  4.1 4.1 4.4 4.4 4.3 4.4  CL 103  --  104 97* 106 105 107 104  CO2 20*  --  19* 27 23 19* 23 22  GLUCOSE 177*  --  81 332* 183* 182* 81 175*  BUN 105*  --  100* 16 87* 88* 75* 63*  CREATININE 5.98*  --  6.04* 0.96 5.19* 5.27* 4.98* 4.19*  CALCIUM 9.6   < > 9.2 8.7* 9.3  9.1 9.2 9.5 8.9  PHOS  --   --   --  3.0  --  5.2*  --   --    < > = values in this interval not displayed.   CBC Recent Labs  Lab 11/02/19 0613 11/04/19 0959 11/05/19 0724 11/06/19 0230  WBC 7.1 8.2 8.1 7.1  NEUTROABS  --   --   --  3.9  HGB 10.1* 9.6* 9.8* 8.8*  HCT 32.7* 31.9* 32.7* 28.4*  MCV 84.5 85.3 84.9 85.3  PLT 370 367 375 356    Medications:    . aspirin EC  81 mg Oral Daily  . Chlorhexidine Gluconate Cloth  6 each Topical Q0600  . collagenase   Topical Daily  . docusate sodium  100 mg Oral BID  . gabapentin  100 mg Oral BID  .  insulin aspart  0-9 Units Subcutaneous TID WC  . insulin aspart  4 Units Subcutaneous TID WC  . insulin glargine  10 Units Subcutaneous QHS  . multivitamin with minerals  1 tablet Oral Daily  . nutrition supplement (JUVEN)  1 packet Oral BID BM  . prochlorperazine  10 mg Intravenous QHS  . Ensure Max Protein  11 oz Oral BID  . sodium chloride flush  3 mL Intravenous Q12H  . sodium polystyrene  10 g Oral Daily      [name], MD [facility] 11/08/2019, 2:46 PM

## 2019-11-08 NOTE — Progress Notes (Signed)
PROGRESS NOTE  Zachary Young ZOX:[id number] DOB: 1960-06-26 DOA: 10/17/2019 PCP: System, Pcp Not In  HPI/Recap of past 24 hours:  [name] PMHxCKD stage IV, type II diabetes with hyperglycemia, Diabetic Neuropathy, Chronic systolic and diastolic CHF, HTN. Presentedafter passing out. Zachary Young notes that he went out on to his front porch to get some fresh air. Once out there, he became dizzy and had to lean against the house, and then he passed out. He slid down the side of the house and did not hit his head. He noted no chest pain, no fever, no chills, no change in urination. He does note that he has had a decreased appetite for the last 3-4 days, eating only about half of what he normally does. He has passed out before in the past when his sugar drops, and he thinks this may have happened today. He denies chest pain. He has SOB with ambulation at times. He reports taking torsemide 80mg  in the morning and evening (4 tablets) and not missing doses. He was having issues with pain in bed, and as I looked at his legs, I noted that his right leg was swollen, warm and mildly erythematous compared to the left.  He noted a new wound on the plantar surface, at the 4th and 5th digit which he noticed first about 2 weeks ago.    Post right below the knee amputation on 10/21/2019 by Dr. Sharol Given.  Hospital course complicated by worsening renal function in the setting of CKD IV for which nephrology was consulted.  Has very poor insight into his medical condition.  His 2 sisters Zachary Young and Zachary Young are his medical decision makers.  Updated the patient's sister Ms. Zachary Young via phone.  Per his sister he is presently homeless.  He has difficulty understanding the severity of his medical condition.  Her along with her sister Zachary Young are making medical decisions for him.    Patient is on parole and his sister Zachary Young is working to lift it up in order to facilitate his placement to SNF.   Post right IJ TDC placement by  IR on 11/05/2019.  1st HD completed on 11/06/2019.  2nd HD planned on 11/07/2019.  Difficult placement, TOC assisting with SNF placement.  11/08/19: Seen and examined at his bedside. Reports he vomited last night.  Denies any nausea this morning.  No chest pain, palpitation or dyspnea.   Assessment/Plan: Active Problems:   Hypertensive heart disease with CHF (congestive heart failure) (HCC)   Chronic kidney disease, stage 4 (severe) (HCC)   HLD (hyperlipidemia)   HTN (hypertension)   Systolic and diastolic CHF, chronic (HCC)   Tobacco use disorder   Type 2 diabetes mellitus with circulatory disorder, with long-term current use of insulin (HCC)   Wound of right foot   Syncope and collapse   Cocaine abuse ([place])   Subacute osteomyelitis, right ankle and foot (HCC)   Abscess of right foot   Diabetic neuropathy (HCC)   Anemia of chronic disease   Severe obesity (BMI 35.0-39.9) with comorbidity ([place])   Metabolic acidosis   DM (diabetes mellitus), secondary, uncontrolled, with complications (HCC)   Elevated sedimentation rate   Elevated C-reactive protein (CRP)   Hypoalbuminemia   Goals of care, counseling/discussion   Palliative care by specialist  Right foot wound status post BKA on 10/21/19 by Dr. Sharol Given Patient denies any pain currently.  Patient reports he fell on the residual limb and had some bleeding on 7/21. Seen by ortho on 7/22,  patient denies any wound dehiscence Ortho recommends to hold SQ lovenox until the hematoma has resolved.  Will follow up with Ortho as an outpatient. PT has recommended SNF, difficult placement.  Progressive CKD V, now ESRD  Presented with nonoliguric acute renal failure on chronic kidney disease stage IV, hyperkalemia, with evidence of early uremic symptoms Prior to admission followed by nephrology in [place].  Post right IJ TDC placement by IR Dr. Earleen Newport, on 11/05/2019 1st session of hemodialysis on 11/06/2019 2nd session of hemodialysis on  11/07/2019 Will need HD spot prior to DC, TOC working on SNF placement Highly appreciate nephrology's assistance  Transient intractable nausea and vomiting Suspect secondary to uremia in the setting of progressive CKD V  Continue hemodialysis  Diabetes mellitus type 2, uncontrolled with hyperglycemia and renal complications [id number] 8.8.  Reduced doses of insulin to avoid hypoglycemia in the setting of new ESRD Lantus reduced to 10 U nightly NovoLog reduced to 4 units 3 times daily Continue with insulin sliding scale, sensitive.  Avoid hypoglycemia  Syncope, no recurrence Likely multifactorial including overdiuresis and acute kidney injury and acute infection.  No further episodes in the hospital.  Continue to monitor  Chronic diastolic CHF EF 50 to 38% based on echocardiogram done in May.  Volume status addressed with hemodialysis Continue to monitor daily weight  Hypotension, currently blood pressures are soft Hypotensive at dialysis Hold off Norvasc  Maintain MAP greater than 65 Continue to monitor vital signs  Anemia of chronic kidney disease/acute blood loss-bled from his stump, resolved. Hemoglobin is 8.8 from 9.8 On 10 CBC in the morning  Acute blood loss anemia from bleeding stump 7/21, and 7/22. Seen by orthopedic surgery, Dr. Sharol Given, on 7/22, recommended to hold off Lovenox until hematoma has resolved Hg as reported above Continue to monitor H&H  History of cocaine abuse His urine drug screen from 7/4 was positive for cocaine Cessation counseling done at bedside  Obesity Estimated body mass index is 35.35 kg/m as calculated from the following: Height as of this encounter: 6\' 3"  (1.905 m). Weight as of this encounter: 128.3 kg.  Behavioral issues Patient has exhibited belligerent behavior with staff. He becomes verbally abusive. Appears to have very poor understanding of his multiple medical problems.  His sisters Zachary Young and Zachary Young are his  medical decision makers.  Goals of care Seen by palliative care previously.  Palliative care team reconsulted to assist with establishing goals of care, appreciate assistance. His sisters Zachary Young and Zachary Young are his medical decision makers. DNR as of 11/03/2019   DVT Prophylaxis:SCDs Code Status:DNR Family Communication:Updated his Sister Zachary Young via phone. Disposition Plan:Status is: Inpatient    Status is: Inpatient  Remains inpatient appropriate because:Inpatient level of care appropriate due to severity of illness   Dispo:             Patient From: Home             Planned Disposition: To be determined              Expected discharge date: 11/10/19             Medically not stable for discharge due to now ESRD on hemodialysis requiring an HD spot prior to discharge.       Objective: Vitals:   11/07/19 2353 11/08/19 0018 11/08/19 0424 11/08/19 1111  BP: 102/69 120/73 109/76 (!) 134/75  Pulse: 80 78 70 78  Resp: 18 18 18    Temp: 98.2 F (36.8 C) 98.6 F (37 C)  97.6 F (36.4 C)   TempSrc: Oral Oral Oral   SpO2: 98% 98% 100%   Weight:      Height:        Intake/Output Summary (Last 24 hours) at 11/08/2019 1414 Last data filed at 11/08/2019 1255 Gross per 24 hour  Intake 1200 ml  Output 698 ml  Net 502 ml   Filed Weights   11/06/19 0424 11/06/19 1223 11/07/19 0457  Weight: (!) 128.9 kg (!) 128.9 kg (!) 128 kg    Exam:  . General: 59 y.o. year-old male well-developed nourished no acute stress.  Alert and oriented x3. . Cardiovascular: Regular rate and rhythm no rubs or gallops. Marland Kitchen Respiratory: Clear to auscultation no wheezes or rales.   . Abdomen: Obese nontender normal bowel sounds present.   . Musculoskeletal: Right below the knee amputation.  Improved trace lower extremity edema and left lower extremity.  Marland Kitchen Psychiatry: Mood is appropriate for condition and setting.   Data Reviewed: CBC: Recent Labs  Lab 11/02/19 0613 11/04/19 0959  11/05/19 0724 11/06/19 0230  WBC 7.1 8.2 8.1 7.1  NEUTROABS  --   --   --  3.9  HGB 10.1* 9.6* 9.8* 8.8*  HCT 32.7* 31.9* 32.7* 28.4*  MCV 84.5 85.3 84.9 85.3  PLT 370 367 375 597   Basic Metabolic Panel: Recent Labs  Lab 11/06/19 0551 11/06/19 1238 11/06/19 1645 11/07/19 0218 11/08/19 0442  NA 131* 138 136 140 136  K 4.1 4.4 4.4 4.3 4.4  CL 97* 106 105 107 104  CO2 27 23 19* 23 22  GLUCOSE 332* 183* 182* 81 175*  BUN 16 87* 88* 75* 63*  CREATININE 0.96 5.19* 5.27* 4.98* 4.19*  CALCIUM 8.7* 9.3  9.1 9.2 9.5 8.9  PHOS 3.0  --  5.2*  --   --    GFR: Estimated Creatinine Clearance: 28 mL/min (A) (by C-G formula based on SCr of 4.19 mg/dL (H)). Liver Function Tests: Recent Labs  Lab 11/06/19 0551 11/06/19 1645  ALBUMIN 2.6* 2.5*   No results for input(s): LIPASE, AMYLASE in the last 168 hours. No results for input(s): AMMONIA in the last 168 hours. Coagulation Profile: Recent Labs  Lab 11/05/19 1221  INR 1.0   Cardiac Enzymes: No results for input(s): CKTOTAL, CKMB, CKMBINDEX, TROPONINI in the last 168 hours. BNP (last 3 results) No results for input(s): PROBNP in the last 8760 hours. HbA1C: No results for input(s): HGBA1C in the last 72 hours. CBG: Recent Labs  Lab 11/07/19 2000 11/08/19 0022 11/08/19 0421 11/08/19 0609 11/08/19 1107  GLUCAP 124* 127* 166* 144* 119*   Lipid Profile: No results for input(s): CHOL, HDL, LDLCALC, TRIG, CHOLHDL, LDLDIRECT in the last 72 hours. Thyroid Function Tests: No results for input(s): TSH, T4TOTAL, FREET4, T3FREE, THYROIDAB in the last 72 hours. Anemia Panel: Recent Labs    11/05/19 1933 11/06/19 1238 11/06/19 1645  VITAMINB12  --  753  --   FOLATE 13.3  --   --   FERRITIN  --  86  --   TIBC  --  265  --   IRON  --  28*  --   RETICCTPCT  --   --  2.0   Urine analysis:    Component Value Date/Time   COLORURINE YELLOW 08/15/2019 1538   APPEARANCEUR CLEAR 08/15/2019 1538   LABSPEC 1.010 08/15/2019 1538    PHURINE 5.0 08/15/2019 1538   GLUCOSEU NEGATIVE 08/15/2019 1538   HGBUR SMALL (A) 08/15/2019 1538   BILIRUBINUR  NEGATIVE 08/15/2019 1538   KETONESUR NEGATIVE 08/15/2019 1538   PROTEINUR >=300 (A) 08/15/2019 1538   NITRITE NEGATIVE 08/15/2019 1538   LEUKOCYTESUR NEGATIVE 08/15/2019 1538   Sepsis Labs: @LABRCNTIP (procalcitonin:4,lacticidven:4)  )No results found for this or any previous visit (from the past 240 hour(s)).    Studies: No results found.  Scheduled Meds: . aspirin EC  81 mg Oral Daily  . Chlorhexidine Gluconate Cloth  6 each Topical Q0600  . collagenase   Topical Daily  . docusate sodium  100 mg Oral BID  . gabapentin  100 mg Oral BID  . insulin aspart  0-9 Units Subcutaneous TID WC  . insulin aspart  4 Units Subcutaneous TID WC  . insulin glargine  10 Units Subcutaneous QHS  . multivitamin with minerals  1 tablet Oral Daily  . nutrition supplement (JUVEN)  1 packet Oral BID BM  . prochlorperazine  10 mg Intravenous QHS  . Ensure Max Protein  11 oz Oral BID  . sodium chloride flush  3 mL Intravenous Q12H  . sodium polystyrene  10 g Oral Daily    Continuous Infusions: . [START ON 11/09/2019] ferric gluconate (FERRLECIT/NULECIT) IV       LOS: 22 days     Kayleen Memos, MD [facility] Pager [phone]  If 7PM-7AM, please contact night-coverage [email or web address] Password [facility] 11/08/2019, 2:14 PM

## 2019-11-09 DIAGNOSIS — L02611 Cutaneous abscess of right foot: Secondary | ICD-10-CM | POA: Diagnosis not present

## 2019-11-09 LAB — CBC WITH DIFFERENTIAL/PLATELET
Abs Immature Granulocytes: 0.01 10*3/uL (ref 0.00–0.07)
Basophils Absolute: 0 10*3/uL (ref 0.0–0.1)
Basophils Relative: 0 %
Eosinophils Absolute: 0.5 10*3/uL (ref 0.0–0.5)
Eosinophils Relative: 6 %
HCT: 29.7 % — ABNORMAL LOW (ref 39.0–52.0)
Hemoglobin: 9.1 g/dL — ABNORMAL LOW (ref 13.0–17.0)
Immature Granulocytes: 0 %
Lymphocytes Relative: 31 %
Lymphs Abs: 2.4 10*3/uL (ref 0.7–4.0)
MCH: 26.3 pg (ref 26.0–34.0)
MCHC: 30.6 g/dL (ref 30.0–36.0)
MCV: 85.8 fL (ref 80.0–100.0)
Monocytes Absolute: 0.8 10*3/uL (ref 0.1–1.0)
Monocytes Relative: 10 %
Neutro Abs: 4 10*3/uL (ref 1.7–7.7)
Neutrophils Relative %: 53 %
Platelets: 295 10*3/uL (ref 150–400)
RBC: 3.46 MIL/uL — ABNORMAL LOW (ref 4.22–5.81)
RDW: 15.7 % — ABNORMAL HIGH (ref 11.5–15.5)
WBC: 7.7 10*3/uL (ref 4.0–10.5)
nRBC: 0 % (ref 0.0–0.2)

## 2019-11-09 LAB — GLUCOSE, CAPILLARY
Glucose-Capillary: 248 mg/dL — ABNORMAL HIGH (ref 70–99)
Glucose-Capillary: 159 mg/dL — ABNORMAL HIGH (ref 70–99)
Glucose-Capillary: 200 mg/dL — ABNORMAL HIGH (ref 70–99)
Glucose-Capillary: 150 mg/dL — ABNORMAL HIGH (ref 70–99)

## 2019-11-09 LAB — BASIC METABOLIC PANEL
Anion gap: 9 (ref 5–15)
BUN: 72 mg/dL — ABNORMAL HIGH (ref 6–20)
CO2: 24 mmol/L (ref 22–32)
Calcium: 8.7 mg/dL — ABNORMAL LOW (ref 8.9–10.3)
Chloride: 105 mmol/L (ref 98–111)
Creatinine, Ser: 5.14 mg/dL — ABNORMAL HIGH (ref 0.61–1.24)
GFR calc Af Amer: 13 mL/min — ABNORMAL LOW (ref >60)
GFR calc non Af Amer: 11 mL/min — ABNORMAL LOW (ref >60)
Glucose, Bld: 328 mg/dL — ABNORMAL HIGH (ref 70–99)
Potassium: 4.4 mmol/L (ref 3.5–5.1)
Sodium: 138 mmol/L (ref 135–145)

## 2019-11-09 LAB — PHOSPHORUS: Phosphorus: 3.4 mg/dL (ref 2.5–4.6)

## 2019-11-09 MED ORDER — INSULIN GLARGINE 100 UNIT/ML ~~LOC~~ SOLN
15.0000 [IU] | Freq: Every day | SUBCUTANEOUS | Status: DC
  Administered 2019-11-09 – 2019-12-02 (×23): 15 [IU] via SUBCUTANEOUS
  Filled 2019-11-09 (×30): qty 0.15

## 2019-11-09 MED ORDER — GABAPENTIN 100 MG PO CAPS
200.0000 mg | ORAL_CAPSULE | Freq: Two times a day (BID) | ORAL | Status: DC
  Administered 2019-11-09 – 2019-11-16 (×13): 200 mg via ORAL
  Filled 2019-11-09 (×14): qty 2

## 2019-11-09 MED ORDER — OXYCODONE-ACETAMINOPHEN 5-325 MG PO TABS
1.0000 | ORAL_TABLET | Freq: Four times a day (QID) | ORAL | Status: DC | PRN
  Administered 2019-11-09 – 2019-11-16 (×20): 1 via ORAL
  Filled 2019-11-09 (×23): qty 1

## 2019-11-09 NOTE — Progress Notes (Signed)
PROGRESS NOTE  Zachary Young CLE:[id number] DOB: 10-15-1960 DOA: 10/17/2019 PCP: System, Pcp Not In  HPI/Recap of past 24 hours:  [name] PMHxCKD stage IV, type II diabetes with hyperglycemia, Diabetic Neuropathy, Chronic systolic and diastolic CHF, HTN. Presentedafter passing out. Zachary Young notes that he went out on to his front porch to get some fresh air. Once out there, he became dizzy and had to lean against the house, and then he passed out. He slid down the side of the house and did not hit his head. He noted no chest pain, no fever, no chills, no change in urination. He does note that he has had a decreased appetite for the last 3-4 days, eating only about half of what he normally does. He has passed out before in the past when his sugar drops, and he thinks this may have happened today. He denies chest pain. He has SOB with ambulation at times. He reports taking torsemide 80mg  in the morning and evening (4 tablets) and not missing doses. He was having issues with pain in bed, and as I looked at his legs, I noted that his right leg was swollen, warm and mildly erythematous compared to the left.  He noted a new wound on the plantar surface, at the 4th and 5th digit which he noticed first about 2 weeks ago.    Post right below the knee amputation on 10/21/2019 by Dr. Sharol Given.  Hospital course complicated by worsening renal function in the setting of CKD IV for which nephrology was consulted.  Has very poor insight into his medical condition.  His 2 sisters Zachary Young and Zachary Young are his medical decision makers.  Updated the patient's sister Ms. Zachary Young via phone.  Per his sister he is presently homeless.  He has difficulty understanding the severity of his medical condition.  Her along with her sister Zachary Young are making medical decisions for him.    Patient is on parole and his sister Zachary Young is working to lift it up in order to facilitate his placement to SNF.   Post right IJ TDC placement by  IR on 11/05/2019.  1st HD completed on 11/06/2019.  2nd HD planned on 11/07/2019.  Difficult placement, TOC assisting with SNF placement.  11/09/19: Seen and examined at his bedside. Denies nausea.  Wants to leave the hospital.  Appreciate TOC's assistance on placement. Has no other complaints.   Assessment/Plan: Active Problems:   Hypertensive heart disease with CHF (congestive heart failure) (HCC)   Chronic kidney disease, stage 4 (severe) (HCC)   HLD (hyperlipidemia)   HTN (hypertension)   Systolic and diastolic CHF, chronic (HCC)   Tobacco use disorder   Type 2 diabetes mellitus with circulatory disorder, with long-term current use of insulin (HCC)   Wound of right foot   Syncope and collapse   Cocaine abuse ([place])   Subacute osteomyelitis, right ankle and foot (HCC)   Abscess of right foot   Diabetic neuropathy (HCC)   Anemia of chronic disease   Severe obesity (BMI 35.0-39.9) with comorbidity ([place])   Metabolic acidosis   DM (diabetes mellitus), secondary, uncontrolled, with complications (HCC)   Elevated sedimentation rate   Elevated C-reactive protein (CRP)   Hypoalbuminemia   Goals of care, counseling/discussion   Palliative care by specialist  Right foot wound status post BKA on 10/21/19 by Dr. Sharol Given Patient denies any pain currently.  Patient reports he fell on the residual limb and had some bleeding on 7/21. Seen by ortho on 7/22,  patient denies any wound dehiscence Ortho recommends to hold SQ lovenox until the hematoma has resolved.  Will follow up with Ortho as an outpatient. PT has recommended SNF, difficult placement.  Progressive CKD V, now ESRD  Presented with nonoliguric acute renal failure on chronic kidney disease stage IV, hyperkalemia, with evidence of early uremic symptoms Prior to admission followed by nephrology in [place].  Post right IJ TDC placement by IR Dr. Earleen Newport, on 11/05/2019 1st session of hemodialysis on 11/06/2019 2nd session of hemodialysis  on 11/07/2019 Will need HD spot prior to DC, TOC working on SNF placement Highly appreciate nephrology's assistance  Transient intractable nausea and vomiting Suspect secondary to uremia in the setting of progressive CKD V  Continue hemodialysis  Diabetes mellitus type 2, uncontrolled with hyperglycemia and renal complications EPP2R 8.8.  Reduced doses of insulin to avoid hypoglycemia in the setting of new ESRD Lantus reduced to 10 U nightly NovoLog reduced to 4 units 3 times daily Continue with insulin sliding scale, sensitive.  Avoid hypoglycemia  Syncope, no recurrence Likely multifactorial including overdiuresis and acute kidney injury and acute infection.  No further episodes in the hospital.  Continue to monitor  Chronic diastolic CHF EF 50 to 51% based on echocardiogram done in May.  Volume status addressed with hemodialysis Continue to monitor daily weight  Hypotension, currently blood pressures are soft Hypotensive at dialysis Hold off Norvasc  Maintain MAP greater than 65 Continue to monitor vital signs  Anemia of chronic kidney disease/acute blood loss-bled from his stump, resolved. Hemoglobin is 8.8 from 9.8 On 10 CBC in the morning  Acute blood loss anemia from bleeding stump 7/21, and 7/22. Seen by orthopedic surgery, Dr. Sharol Given, on 7/22, recommended to hold off Lovenox until hematoma has resolved Hg as reported above Continue to monitor H&H  History of cocaine abuse His urine drug screen from 7/4 was positive for cocaine Cessation counseling done at bedside  Obesity Estimated body mass index is 35.35 kg/m as calculated from the following: Height as of this encounter: 6\' 3"  (1.905 m). Weight as of this encounter: 128.3 kg.  Behavioral issues Patient has exhibited belligerent behavior with staff. He becomes verbally abusive. Appears to have very poor understanding of his multiple medical problems.  His sisters Zachary Young and Zachary Young are his  medical decision makers.  Goals of care Seen by palliative care previously.  Palliative care team reconsulted to assist with establishing goals of care, appreciate assistance. His sisters Zachary Young and Zachary Young are his medical decision makers. DNR as of 11/03/2019   DVT Prophylaxis:SCDs Code Status:DNR Family Communication:Updated his Sister Zachary Young via phone. Disposition Plan:Status is: Inpatient    Status is: Inpatient  Remains inpatient appropriate because:Inpatient level of care appropriate due to severity of illness   Dispo:             Patient From: Home             Planned Disposition: To be determined              Expected discharge date: 11/10/19             Medically not stable for discharge due to now ESRD on hemodialysis requiring an HD spot prior to discharge.       Objective: Vitals:   11/08/19 1749 11/09/19 0100 11/09/19 0920 11/09/19 1203  BP: 111/67 123/71 (!) 131/77 116/65  Pulse: 86 88 83 78  Resp: 18 18 16 16   Temp: 99.5 F (37.5 C) 98.9 F (37.2 C)  97.9 F (36.6 C) 97.8 F (36.6 C)  TempSrc: Oral Oral Oral Oral  SpO2: 100% 98% 98% 100%  Weight:      Height:        Intake/Output Summary (Last 24 hours) at 11/09/2019 1452 Last data filed at 11/09/2019 1130 Gross per 24 hour  Intake 703 ml  Output 600 ml  Net 103 ml   Filed Weights   11/06/19 0424 11/06/19 1223 11/07/19 0457  Weight: (!) 128.9 kg (!) 128.9 kg (!) 128 kg    Exam: No significant changes from prior exam.  . General: 59 y.o. year-old male well-developed nourished no acute stress.  Alert and oriented x3. . Cardiovascular: Regular rate and rhythm no rubs or gallops. Marland Kitchen Respiratory: Clear to auscultation no wheezes or rales.   . Abdomen: Obese nontender normal bowel sounds present.   . Musculoskeletal: Right below the knee amputation.  Improved trace lower extremity edema and left lower extremity.  Marland Kitchen Psychiatry: Mood is appropriate for condition and setting.   Data  Reviewed: CBC: Recent Labs  Lab 11/04/19 0959 11/05/19 0724 11/06/19 0230 11/09/19 0212  WBC 8.2 8.1 7.1 7.7  NEUTROABS  --   --  3.9 4.0  HGB 9.6* 9.8* 8.8* 9.1*  HCT 31.9* 32.7* 28.4* 29.7*  MCV 85.3 84.9 85.3 85.8  PLT 367 375 356 299   Basic Metabolic Panel: Recent Labs  Lab 11/06/19 0551 11/06/19 0551 11/06/19 1238 11/06/19 1645 11/07/19 0218 11/08/19 0442 11/09/19 0212 11/09/19 0706  NA 131*   < > 138 136 140 136 138  --   K 4.1   < > 4.4 4.4 4.3 4.4 4.4  --   CL 97*   < > 106 105 107 104 105  --   CO2 27   < > 23 19* 23 22 24   --   GLUCOSE 332*   < > 183* 182* 81 175* 328*  --   BUN 16   < > 87* 88* 75* 63* 72*  --   CREATININE 0.96   < > 5.19* 5.27* 4.98* 4.19* 5.14*  --   CALCIUM 8.7*   < > 9.3  9.1 9.2 9.5 8.9 8.7*  --   PHOS 3.0  --   --  5.2*  --   --   --  3.4   < > = values in this interval not displayed.   GFR: Estimated Creatinine Clearance: 22.9 mL/min (A) (by C-G formula based on SCr of 5.14 mg/dL (H)). Liver Function Tests: Recent Labs  Lab 11/06/19 0551 11/06/19 1645  ALBUMIN 2.6* 2.5*   No results for input(s): LIPASE, AMYLASE in the last 168 hours. No results for input(s): AMMONIA in the last 168 hours. Coagulation Profile: Recent Labs  Lab 11/05/19 1221  INR 1.0   Cardiac Enzymes: No results for input(s): CKTOTAL, CKMB, CKMBINDEX, TROPONINI in the last 168 hours. BNP (last 3 results) No results for input(s): PROBNP in the last 8760 hours. HbA1C: No results for input(s): HGBA1C in the last 72 hours. CBG: Recent Labs  Lab 11/08/19 1107 11/08/19 1555 11/08/19 2146 11/09/19 0630 11/09/19 1055  GLUCAP 119* 170* 189* 248* 159*   Lipid Profile: No results for input(s): CHOL, HDL, LDLCALC, TRIG, CHOLHDL, LDLDIRECT in the last 72 hours. Thyroid Function Tests: No results for input(s): TSH, T4TOTAL, FREET4, T3FREE, THYROIDAB in the last 72 hours. Anemia Panel: Recent Labs    11/06/19 1645  RETICCTPCT 2.0   Urine analysis:     Component Value Date/Time  COLORURINE YELLOW 08/15/2019 1538   APPEARANCEUR CLEAR 08/15/2019 1538   LABSPEC 1.010 08/15/2019 1538   PHURINE 5.0 08/15/2019 1538   GLUCOSEU NEGATIVE 08/15/2019 1538   HGBUR SMALL (A) 08/15/2019 1538   BILIRUBINUR NEGATIVE 08/15/2019 1538   KETONESUR NEGATIVE 08/15/2019 1538   PROTEINUR >=300 (A) 08/15/2019 1538   NITRITE NEGATIVE 08/15/2019 1538   LEUKOCYTESUR NEGATIVE 08/15/2019 1538   Sepsis Labs: @LABRCNTIP (procalcitonin:4,lacticidven:4)  )No results found for this or any previous visit (from the past 240 hour(s)).    Studies: No results found.  Scheduled Meds: . aspirin EC  81 mg Oral Daily  . Chlorhexidine Gluconate Cloth  6 each Topical Q0600  . collagenase   Topical Daily  . docusate sodium  100 mg Oral BID  . gabapentin  200 mg Oral BID  . insulin aspart  0-9 Units Subcutaneous TID WC  . insulin aspart  4 Units Subcutaneous TID WC  . insulin glargine  15 Units Subcutaneous QHS  . multivitamin with minerals  1 tablet Oral Daily  . nutrition supplement (JUVEN)  1 packet Oral BID BM  . prochlorperazine  10 mg Intravenous QHS  . Ensure Max Protein  11 oz Oral BID  . sodium chloride flush  3 mL Intravenous Q12H    Continuous Infusions: . ferric gluconate (FERRLECIT/NULECIT) IV 125 mg (11/09/19 1130)     LOS: 23 days     Kayleen Memos, MD [facility] Pager [phone]  If 7PM-7AM, please contact night-coverage [email or web address] Password [email or web address] 11/09/2019, 2:52 PM

## 2019-11-09 NOTE — Progress Notes (Signed)
PT Cancellation Note  Patient Details Name: Zachary Young MRN: [id number] DOB: [birth year]   Cancelled Treatment:    Reason Eval/Treat Not Completed: Patient declined, no reason specified   Will follow up later today as time allows;  Otherwise, will follow up for PT tomorrow;   Thank you,  Roney Marion, PT  Acute Rehabilitation Services Pager [phone] Office [phone]     [place] 11/09/2019, 12:17 PM

## 2019-11-09 NOTE — Progress Notes (Signed)
Admit: 10/17/2019 LOS: 23  59M new ESRD, several social barriers  Subjective:  . For HD today, he agrees . No other c/o   07/25 0701 - 07/26 0700 In: 703 [P.O.:700; I.V.:3] Out: 1100 [Urine:1100]  Filed Weights   11/06/19 0424 11/06/19 1223 11/07/19 0457  Weight: (!) 128.9 kg (!) 128.9 kg (!) 128 kg    Scheduled Meds: . aspirin EC  81 mg Oral Daily  . Chlorhexidine Gluconate Cloth  6 each Topical Q0600  . collagenase   Topical Daily  . docusate sodium  100 mg Oral BID  . gabapentin  200 mg Oral BID  . insulin aspart  0-9 Units Subcutaneous TID WC  . insulin aspart  4 Units Subcutaneous TID WC  . insulin glargine  15 Units Subcutaneous QHS  . multivitamin with minerals  1 tablet Oral Daily  . nutrition supplement (JUVEN)  1 packet Oral BID BM  . prochlorperazine  10 mg Intravenous QHS  . Ensure Max Protein  11 oz Oral BID  . sodium chloride flush  3 mL Intravenous Q12H  . sodium polystyrene  10 g Oral Daily   Continuous Infusions: . ferric gluconate (FERRLECIT/NULECIT) IV 125 mg (11/09/19 1130)   PRN Meds:.albuterol, HYDROmorphone (DILAUDID) injection, hydrOXYzine, ondansetron **OR** ondansetron (ZOFRAN) IV, oxyCODONE-acetaminophen, prochlorperazine  Current Labs: reviewed    Physical Exam:  Blood pressure 116/65, pulse 78, temperature 97.8 F (36.6 C), temperature source Oral, resp. rate 16, height 6\' 3"  (1.905 m), weight (!) 128 kg, SpO2 100 %. NAD R IJ TDC C/D/I RRR CTAB RLE amputation, LLE 2+edema  A 1. New ESRD started 7/23 via [place] 1. CLIP in process, dep't upon SNF, could be difficult 2. ON MWF schedule TDC 3. Will need AVF, discuss again with him tomorrow 2. Metabolic Acidosis, stable 3. Hyperkalemia, improved 4. Homeless 5. Cocaine use 6. DM2 7. Anemia, on IV Fe, begin ESA 8. CKD-BMD: Ca and P ok  P . HD today . CLIP / SNF in process . Stop standing kayexalate . Medication Issues; o Preferred narcotic agents for pain control are hydromorphone,  fentanyl, and methadone. Morphine should not be used.  o Baclofen should be avoided o Avoid oral sodium phosphate and magnesium citrate based laxatives / bowel preps    Pearson Grippe MD 11/09/2019, 12:32 PM  Recent Labs  Lab 11/06/19 0551 11/06/19 1238 11/06/19 1645 11/06/19 1645 11/07/19 0218 11/08/19 0442 11/09/19 0212 11/09/19 0706  NA 131*   < > 136   < > 140 136 138  --   K 4.1   < > 4.4   < > 4.3 4.4 4.4  --   CL 97*   < > 105   < > 107 104 105  --   CO2 27   < > 19*   < > 23 22 24   --   GLUCOSE 332*   < > 182*   < > 81 175* 328*  --   BUN 16   < > 88*   < > 75* 63* 72*  --   CREATININE 0.96   < > 5.27*   < > 4.98* 4.19* 5.14*  --   CALCIUM 8.7*   < > 9.2   < > 9.5 8.9 8.7*  --   PHOS 3.0  --  5.2*  --   --   --   --  3.4   < > = values in this interval not displayed.   Recent Labs  Lab 11/05/19 [phone] 11/06/19  0230 11/09/19 0212  WBC 8.1 7.1 7.7  NEUTROABS  --  3.9 4.0  HGB 9.8* 8.8* 9.1*  HCT 32.7* 28.4* 29.7*  MCV 84.9 85.3 85.8  PLT 375 356 295

## 2019-11-09 NOTE — TOC Progression Note (Signed)
Transition of Care [facility]) - Progression Note    Patient Details  Name: Zachary Young MRN: [id number] Date of Birth: 07/17/1960  Transition of Care [facility]) CM/SW [place], [place] Phone Number: 11/09/2019, 12:02 PM  Clinical Narrative:    Await return call from parole officer, family and this writer have f/u to ensure SNF placement can be done. Per parole officer voicemail only one message allowed to be left, will f/u tomorrow if still no call back.   Expected Discharge Plan and Services Discharge Planning Services: CM Consult Living arrangements for the past 2 months: Single Family Home  Readmission Risk Interventions No flowsheet data found.

## 2019-11-10 DIAGNOSIS — N185 Chronic kidney disease, stage 5: Secondary | ICD-10-CM

## 2019-11-10 DIAGNOSIS — L02611 Cutaneous abscess of right foot: Secondary | ICD-10-CM | POA: Diagnosis not present

## 2019-11-10 LAB — CBC
HCT: 29.3 % — ABNORMAL LOW (ref 39.0–52.0)
Hemoglobin: 8.9 g/dL — ABNORMAL LOW (ref 13.0–17.0)
MCH: 26.2 pg (ref 26.0–34.0)
MCHC: 30.4 g/dL (ref 30.0–36.0)
MCV: 86.2 fL (ref 80.0–100.0)
Platelets: 299 10*3/uL (ref 150–400)
RBC: 3.4 MIL/uL — ABNORMAL LOW (ref 4.22–5.81)
RDW: 15.6 % — ABNORMAL HIGH (ref 11.5–15.5)
WBC: 8.1 10*3/uL (ref 4.0–10.5)
nRBC: 0 % (ref 0.0–0.2)

## 2019-11-10 LAB — BASIC METABOLIC PANEL
Anion gap: 9 (ref 5–15)
BUN: 70 mg/dL — ABNORMAL HIGH (ref 6–20)
CO2: 24 mmol/L (ref 22–32)
Calcium: 8.3 mg/dL — ABNORMAL LOW (ref 8.9–10.3)
Chloride: 107 mmol/L (ref 98–111)
Creatinine, Ser: 4.81 mg/dL — ABNORMAL HIGH (ref 0.61–1.24)
GFR calc Af Amer: 14 mL/min — ABNORMAL LOW (ref >60)
GFR calc non Af Amer: 12 mL/min — ABNORMAL LOW (ref >60)
Glucose, Bld: 154 mg/dL — ABNORMAL HIGH (ref 70–99)
Potassium: 4.1 mmol/L (ref 3.5–5.1)
Sodium: 140 mmol/L (ref 135–145)

## 2019-11-10 LAB — GLUCOSE, CAPILLARY
Glucose-Capillary: 140 mg/dL — ABNORMAL HIGH (ref 70–99)
Glucose-Capillary: 133 mg/dL — ABNORMAL HIGH (ref 70–99)
Glucose-Capillary: 176 mg/dL — ABNORMAL HIGH (ref 70–99)
Glucose-Capillary: 149 mg/dL — ABNORMAL HIGH (ref 70–99)

## 2019-11-10 MED ORDER — HEPARIN SODIUM (PORCINE) 1000 UNIT/ML IJ SOLN
INTRAMUSCULAR | Status: AC
  Administered 2019-11-10: 3200 [IU]
  Filled 2019-11-10: qty 4

## 2019-11-10 NOTE — Progress Notes (Addendum)
Post HD Report Duration 3.5 hr Goal at 2L but MD increase to 3L  B/p drop in the 45X systolic during procedure.  Only able remove 1.4L. Additional 200 cc Normal Saline bolus to aid with hypotensive  HD RN will notified nephrologist  Current B/P 119/77 99 % RA 129.7kg post HD weight  Report received from Murriel Hopper (HD RN)

## 2019-11-10 NOTE — Progress Notes (Signed)
PROGRESS NOTE  Zachary Young OZH:[id number] DOB: Oct 13, 1960 DOA: 10/17/2019 PCP: System, Pcp Not In  HPI/Recap of past 24 hours:  [name] PMHxCKD stage IV, type II diabetes with hyperglycemia, Diabetic Neuropathy, Chronic diastolic CHF, HTN, obesity. Presentedafter passing out.  He became dizzy and had to lean against the house, and then he passed out. He slid down the side of the house and did not hit his head. He does note that he has had a decreased appetite for the last 3-4 days, eating only about half of what he normally does. He has passed out before in the past when his sugar drops, and he thinks this may have happened today. He denies chest pain. He has SOB with ambulation at times. He reports taking torsemide 80mg  in the morning and evening (4 tablets) and not missing doses. He was having issues with pain in bed, and as I looked at his legs, I noted that his right leg was swollen, warm and mildly erythematous compared to the left.  He noted a new wound on the plantar surface, at the 4th and 5th digit which he noticed first about 2 weeks ago.    Post right below the knee amputation on 10/21/2019 by orthopedic surgery Dr. Sharol Given.  Hospital course complicated by worsening renal function in the setting of progressive CKD V for which nephrology was consulted.  Has very poor insight into his medical condition.  His 2 sisters Zachary Young and Zachary Young are his medical decision makers.  Per his sister Zachary Young via phone he is presently homeless.  He has difficulty understanding the severity of his medical condition.  Her along with her sister Zachary Young are making medical decisions for him.    Patient is on parole and his sister Zachary Young is working to lift it up in order to facilitate his placement to SNF.    Post right IJ TDC placement by IR on 11/05/2019.  1st HD completed on 11/06/2019.  2nd HD planned on 11/07/2019.  3rd HD completed on 11/10/2019.  Difficult placement, TOC assisting with SNF  placement.  11/10/19: HD today.  No acute events overnight.   Assessment/Plan: Active Problems:   Hypertensive heart disease with CHF (congestive heart failure) (HCC)   Chronic kidney disease, stage 4 (severe) (HCC)   HLD (hyperlipidemia)   HTN (hypertension)   Systolic and diastolic CHF, chronic (HCC)   Tobacco use disorder   Type 2 diabetes mellitus with circulatory disorder, with long-term current use of insulin (HCC)   Wound of right foot   Syncope and collapse   Cocaine abuse ([place])   Subacute osteomyelitis, right ankle and foot (HCC)   Abscess of right foot   Diabetic neuropathy (HCC)   Anemia of chronic disease   Severe obesity (BMI 35.0-39.9) with comorbidity ([place])   Metabolic acidosis   DM (diabetes mellitus), secondary, uncontrolled, with complications (HCC)   Elevated sedimentation rate   Elevated C-reactive protein (CRP)   Hypoalbuminemia   Goals of care, counseling/discussion   Palliative care by specialist  Right foot wound status post BKA on 10/21/19 by Dr. Clarita Leber on the residual limb and had some bleeding on 7/21. Seen by ortho on 7/22, patient denies any wound dehiscence Ortho recommends to hold SQ lovenox until the hematoma has resolved.  Will follow up with Ortho as an outpatient. PT has recommended SNF, difficult placement due to complicated social issues, on parole.  Progressive CKD V, now ESRD  Presented with nonoliguric acute renal failure on chronic kidney disease  stage IV, hyperkalemia, with evidence of early uremic symptoms Prior to admission followed by nephrology in [place].  Post right IJ TDC placement by IR Dr. Earleen Newport, on 11/05/2019 1st session of hemodialysis on 11/06/2019 2nd session of hemodialysis on 11/07/2019 #rd session 11/10/19 Will need HD spot prior to DC, TOC working on SNF placement Highly appreciate nephrology's assistance  Transient intractable nausea and vomiting Suspect secondary to uremia in the setting of progressive CKD  V  Continue hemodialysis  Diabetes mellitus type 2, uncontrolled with hyperglycemia and renal complications WCB7S 8.8.  Reduced doses of insulin to avoid hypoglycemia in the setting of new ESRD Lantus reduced to 10 U nightly NovoLog reduced to 4 units 3 times daily Continue with insulin sliding scale, sensitive.  Avoid hypoglycemia  Syncope, no recurrence Likely multifactorial including overdiuresis and acute kidney injury and acute infection.  No further episodes in the hospital.  Continue to monitor  Chronic diastolic CHF EF 50 to 28% based on echocardiogram done in May.  Volume status addressed with hemodialysis Continue to monitor daily weight  Hypotension, currently blood pressures are soft Hypotensive at dialysis Hold off Norvasc  Maintain MAP greater than 65 Continue to monitor vital signs  Anemia of chronic kidney disease/acute blood loss-bled from his stump, resolved. Hemoglobin is 8.8 from 9.8 On 10 CBC in the morning  Acute blood loss anemia from bleeding stump 7/21, and 7/22. Seen by orthopedic surgery, Dr. Sharol Given, on 7/22, recommended to hold off Lovenox until hematoma has resolved Hg as reported above Continue to monitor H&H  History of cocaine abuse His urine drug screen from 7/4 was positive for cocaine Cessation counseling done at bedside  Obesity Estimated body mass index is 35.35 kg/m as calculated from the following: Height as of this encounter: 6\' 3"  (1.905 m). Weight as of this encounter: 128.3 kg.  Behavioral issues Patient has exhibited belligerent behavior with staff. He becomes verbally abusive. Appears to have very poor understanding of his multiple medical problems.  His sisters Zachary Young and Zachary Young are his medical decision makers.  Goals of care Seen by palliative care previously.  Palliative care team reconsulted to assist with establishing goals of care, appreciate assistance. His sisters Zachary Young and Zachary Young are his medical  decision makers. DNR as of 11/03/2019   DVT Prophylaxis:SCDs Code Status:DNR Family Communication:Updated his Sister Zachary Young via phone. Disposition Plan:Status is: Inpatient    Status is: Inpatient  Remains inpatient appropriate because:Inpatient level of care appropriate due to severity of illness   Dispo:             Patient From: Home             Planned Disposition: To be determined              Expected discharge date: 11/13/19             Medically not stable for discharge due to now ESRD on hemodialysis requiring a stable disposition and an HD spot prior to discharge.       Objective: Vitals:   11/10/19 0953 11/10/19 1000 11/10/19 1026 11/10/19 1030  BP: (!) 132/78 104/73 (!) 132/73 119/77  Pulse: 78 79 77 77  Resp:    18  Temp:    98.5 F (36.9 C)  TempSrc:    Oral  SpO2:    99%  Weight:    (!) 129.7 kg  Height:        Intake/Output Summary (Last 24 hours) at 11/10/2019 1526 Last data filed at  11/10/2019 1300 Gross per 24 hour  Intake 753 ml  Output 1998 ml  Net -1245 ml   Filed Weights   11/07/19 0457 11/10/19 0653 11/10/19 1030  Weight: (!) 128 kg (!) 131 kg (!) 129.7 kg    Exam: No significant changes from prior exam.  . General: 59 y.o. year-old male well-developed nourished no acute stress.  Alert and oriented x3. . Cardiovascular: Regular rate and rhythm no rubs or gallops. Marland Kitchen Respiratory: Clear to auscultation no wheezes or rales.   . Abdomen: Obese nontender normal bowel sounds present.   . Musculoskeletal: Right below the knee amputation.  Improved trace lower extremity edema and left lower extremity.  Marland Kitchen Psychiatry: Mood is appropriate for condition and setting.   Data Reviewed: CBC: Recent Labs  Lab 11/04/19 0959 11/05/19 0724 11/06/19 0230 11/09/19 0212 11/10/19 0434  WBC 8.2 8.1 7.1 7.7 8.1  NEUTROABS  --   --  3.9 4.0  --   HGB 9.6* 9.8* 8.8* 9.1* 8.9*  HCT 31.9* 32.7* 28.4* 29.7* 29.3*  MCV 85.3 84.9 85.3 85.8 86.2   PLT 367 375 356 295 812   Basic Metabolic Panel: Recent Labs  Lab 11/06/19 0551 11/06/19 1238 11/06/19 1645 11/07/19 0218 11/08/19 0442 11/09/19 0212 11/09/19 0706 11/10/19 0434  NA 131*   < > 136 140 136 138  --  140  K 4.1   < > 4.4 4.3 4.4 4.4  --  4.1  CL 97*   < > 105 107 104 105  --  107  CO2 27   < > 19* 23 22 24   --  24  GLUCOSE 332*   < > 182* 81 175* 328*  --  154*  BUN 16   < > 88* 75* 63* 72*  --  70*  CREATININE 0.96   < > 5.27* 4.98* 4.19* 5.14*  --  4.81*  CALCIUM 8.7*   < > 9.2 9.5 8.9 8.7*  --  8.3*  PHOS 3.0  --  5.2*  --   --   --  3.4  --    < > = values in this interval not displayed.   GFR: Estimated Creatinine Clearance: 24.6 mL/min (A) (by C-G formula based on SCr of 4.81 mg/dL (H)). Liver Function Tests: Recent Labs  Lab 11/06/19 0551 11/06/19 1645  ALBUMIN 2.6* 2.5*   No results for input(s): LIPASE, AMYLASE in the last 168 hours. No results for input(s): AMMONIA in the last 168 hours. Coagulation Profile: Recent Labs  Lab 11/05/19 1221  INR 1.0   Cardiac Enzymes: No results for input(s): CKTOTAL, CKMB, CKMBINDEX, TROPONINI in the last 168 hours. BNP (last 3 results) No results for input(s): PROBNP in the last 8760 hours. HbA1C: No results for input(s): HGBA1C in the last 72 hours. CBG: Recent Labs  Lab 11/09/19 1055 11/09/19 1605 11/09/19 2102 11/10/19 0553 11/10/19 1102  GLUCAP 159* 200* 150* 140* 133*   Lipid Profile: No results for input(s): CHOL, HDL, LDLCALC, TRIG, CHOLHDL, LDLDIRECT in the last 72 hours. Thyroid Function Tests: No results for input(s): TSH, T4TOTAL, FREET4, T3FREE, THYROIDAB in the last 72 hours. Anemia Panel: No results for input(s): VITAMINB12, FOLATE, FERRITIN, TIBC, IRON, RETICCTPCT in the last 72 hours. Urine analysis:    Component Value Date/Time   COLORURINE YELLOW 08/15/2019 1538   APPEARANCEUR CLEAR 08/15/2019 1538   LABSPEC 1.010 08/15/2019 1538   PHURINE 5.0 08/15/2019 1538   GLUCOSEU  NEGATIVE 08/15/2019 1538   HGBUR SMALL (A) 08/15/2019  [place] 08/15/2019 [place] 08/15/2019 1538   PROTEINUR >=300 (A) 08/15/2019 1538   NITRITE NEGATIVE 08/15/2019 1538   LEUKOCYTESUR NEGATIVE 08/15/2019 1538   Sepsis Labs: @LABRCNTIP (procalcitonin:4,lacticidven:4)  )No results found for this or any previous visit (from the past 240 hour(s)).    Studies: No results found.  Scheduled Meds: . aspirin EC  81 mg Oral Daily  . Chlorhexidine Gluconate Cloth  6 each Topical Q0600  . collagenase   Topical Daily  . docusate sodium  100 mg Oral BID  . gabapentin  200 mg Oral BID  . insulin aspart  0-9 Units Subcutaneous TID WC  . insulin aspart  4 Units Subcutaneous TID WC  . insulin glargine  15 Units Subcutaneous QHS  . multivitamin with minerals  1 tablet Oral Daily  . nutrition supplement (JUVEN)  1 packet Oral BID BM  . prochlorperazine  10 mg Intravenous QHS  . Ensure Max Protein  11 oz Oral BID  . sodium chloride flush  3 mL Intravenous Q12H    Continuous Infusions: . ferric gluconate (FERRLECIT/NULECIT) IV 125 mg (11/09/19 1130)     LOS: 24 days     Kayleen Memos, MD [facility] Pager [phone]  If 7PM-7AM, please contact night-coverage [email or web address] Password [facility] 11/10/2019, 3:26 PM

## 2019-11-10 NOTE — Progress Notes (Addendum)
Pt completed HD tx, UF goal to 3 lliters per Dr.Sanford. Pt hypotensive, SBPs-80s mid tx with pt c/o dizziness, "not feeling well". UF stopped temporarily and resumed after approximately 1 hour. NS bolus 211ml given with positive effects noted. HD UF=1.4 liters net. Pt stable upon transfer.

## 2019-11-10 NOTE — Progress Notes (Signed)
Patient already left for HD during shift report.

## 2019-11-10 NOTE — Consult Note (Addendum)
Hospital Consult VASCULAR SURGERY ASSESSMENT & PLAN:   END-STAGE RENAL DISEASE: We have been consulted for hemodialysis access.  He is right arm dominant and we will plan access placement in the left arm.  He currently has a functioning right IJ tunneled dialysis catheter and had dialysis today.  Because of scheduling issues we cannot schedule him until Friday.  Thus he can continue his Tuesday Thursday dialysis schedule this week.  I will make further recommendations pending the results of his vein map.  Deitra Mayo, MD Office: [phone]     Reason for Consult:  Dialysis access Requesting Physician:  Dr. Joelyn Oms MRN #:  [id number]  History of Present Illness: This is a 59 y.o. male being seen in consultation for evaluation for dialysis access placement.  He was admitted with right lower extremity wound and eventually underwent right below the knee amputation by Dr. Sharol Given on 10/21/2019.  He has had a prolonged hospital course due to decline in renal function.  Right IJ TDC was placed by interventional radiology and he has been receiving hemodialysis treatments.  Palliative medicine also consulted to discuss goals of care and patient is willing to proceed with outpatient hemodialysis and will require permanent access.  He is right arm dominant and would prefer access in his left arm.  He is seen today during hemodialysis and TDC appears to be working well.  Vein mapping has been ordered but not yet performed.  Surgical history also significant for left TMA several years ago.  Past Medical History:  Diagnosis Date  . CHF (congestive heart failure) ([place])   . Diabetes mellitus without complication ([place])   . Peripheral edema   . Renal disorder    kidney disease    Past Surgical History:  Procedure Laterality Date  . AMPUTATION Right 10/21/2019   Procedure: RIGHT BELOW KNEE AMPUTATION;  Surgeon: Newt Minion, MD;  Location: [place];  Service: Orthopedics;  Laterality: Right;  . IR FLUORO  GUIDE CV LINE RIGHT  11/05/2019  . IR US GUIDE VASC ACCESS RIGHT  11/05/2019  . TOE AMPUTATION Bilateral    due to osteomyelitis, all 5 each foot    Allergies  Allergen Reactions  . Bee Venom Anaphylaxis    Per pt, nearly died from bee sting as a child  . Propofol Other (See Comments)     Hallucinations  . Eggs Or Egg-Derived Products Nausea And Vomiting and Other (See Comments)    Stomach cramps  . Morphine Nausea And Vomiting  . Oxycodone Nausea And Vomiting    Prior to Admission medications   Medication Sig Start Date End Date Taking? Authorizing Provider  albuterol (VENTOLIN HFA) 108 (90 Base) MCG/ACT inhaler Inhale 1 puff into the lungs every 4 (four) hours as needed for wheezing or shortness of breath. 08/20/19  Yes Gladys Damme, MD  amLODipine (NORVASC) 10 MG tablet Take 1 tablet (10 mg total) by mouth daily. 08/21/19  Yes Gladys Damme, MD  aspirin EC 81 MG EC tablet Take 1 tablet (81 mg total) by mouth daily. 08/21/19  Yes Gladys Damme, MD  ergocalciferol (VITAMIN D2) 1.25 MG (50000 UT) capsule Take 50,000 Units by mouth every Friday.  07/13/19  Yes [provider]  gabapentin (NEURONTIN) 300 MG capsule Take 300-600 mg by mouth See admin instructions. Take one capsule (300 mg) by mouth every morning and two capsules (600 mg) at night   Yes [provider]  insulin glargine (LANTUS) 100 UNIT/ML injection Inject 10 Units into the skin  at bedtime.    Yes [provider]  insulin lispro (HUMALOG) 100 UNIT/ML injection Inject 4 Units into the skin 3 (three) times daily with meals.    Yes [provider]  oxyCODONE-acetaminophen (PERCOCET/ROXICET) 5-325 MG tablet Take by mouth every 6 (six) hours as needed for severe pain.   Yes [provider]  torsemide (DEMADEX) 20 MG tablet Take 4 tablets (80 mg total) by mouth 2 (two) times daily. Patient taking differently: Take 20 mg by mouth daily.  08/20/19  Yes Gladys Damme, MD  Insulin Pen  Needle (PEN NEEDLES 31GX5/16") 31G X 8 MM MISC 1 each by Other route daily. 05/16/19 05/15/20  [provider]  Insulin Syringe-Needle U-100 (GLOBAL INJECT EASE INSULIN SYR) 30G X 1/2" 0.5 ML MISC 1 each by Other route 4 (four) times daily as needed. Insulin 07/13/19   [provider]  metoprolol succinate (TOPROL-XL) 100 MG 24 hr tablet Take 100 mg by mouth daily. Take with or immediately following a meal. Patient not taking: Reported on 10/17/2019    [provider]  potassium chloride 20 MEQ TBCR Take 40 mEq by mouth daily. Patient not taking: Reported on 10/17/2019 08/20/19 09/19/19  Eulis Foster, MD    Social History   Socioeconomic History  . Marital status: Single    Spouse name: Not on file  . Number of children: Not on file  . Years of education: Not on file  . Highest education level: Not on file  Occupational History  . Not on file  Tobacco Use  . Smoking status: Current Every Day Smoker    Packs/day: 0.50    Types: Cigarettes  . Smokeless tobacco: Never Used  Vaping Use  . Vaping Use: Never used  Substance and Sexual Activity  . Alcohol use: Not Currently  . Drug use: Yes    Types: Cocaine  . Sexual activity: Not on file  Other Topics Concern  . Not on file  Social History Narrative  . Not on file   Social Determinants of Health   Financial Resource Strain:   . Difficulty of Paying Living Expenses:   Food Insecurity:   . Worried About Charity fundraiser in the Last Year:   . Arboriculturist in the Last Year:   Transportation Needs:   . Film/video editor (Medical):   Marland Kitchen Lack of Transportation (Non-Medical):   Physical Activity:   . Days of Exercise per Week:   . Minutes of Exercise per Session:   Stress:   . Feeling of Stress :   Social Connections:   . Frequency of Communication with Friends and Family:   . Frequency of Social Gatherings with Friends and Family:   . Attends Religious Services:   . Active Member of  Clubs or Organizations:   . Attends Archivist Meetings:   Marland Kitchen Marital Status:   Intimate Partner Violence:   . Fear of Current or Ex-Partner:   . Emotionally Abused:   Marland Kitchen Physically Abused:   . Sexually Abused:      Family History  Problem Relation Age of Onset  . Cancer Mother     ROS: Otherwise negative unless mentioned in HPI  Physical Examination  Vitals:   11/10/19 0830 11/10/19 0900  BP: 121/66 94/69  Pulse: 82 85  Resp:    Temp:    SpO2:     Body mass index is 36.1 kg/m.  General:  WDWN in NAD Gait: Not observed HENT: WNL,  normocephalic Pulmonary: normal non-labored breathing, without Rales, rhonchi,  wheezing Cardiac: regulars Abdomen:  soft, NT/ND, no masses Skin: without rashes Vascular Exam/Pulses: Symmetrical 1+ radial pulses Extremities: Right below the knee amputation dressing left in place; left TMA well-healed Musculoskeletal: no muscle wasting or atrophy  Neurologic: A&O X 3;  No focal weakness or paresthesias are detected; speech is fluent/normal Psychiatric:  The pt has Normal affect. Lymph:  Unremarkable  CBC    Component Value Date/Time   WBC 8.1 11/10/2019 0434   RBC 3.40 (L) 11/10/2019 0434   HGB 8.9 (L) 11/10/2019 0434   HCT 29.3 (L) 11/10/2019 0434   PLT 299 11/10/2019 0434   MCV 86.2 11/10/2019 0434   MCH 26.2 11/10/2019 0434   MCHC 30.4 11/10/2019 0434   RDW 15.6 (H) 11/10/2019 0434   LYMPHSABS 2.4 11/09/2019 0212   MONOABS 0.8 11/09/2019 0212   EOSABS 0.5 11/09/2019 0212   BASOSABS 0.0 11/09/2019 0212    BMET    Component Value Date/Time   NA 140 11/10/2019 0434   K 4.1 11/10/2019 0434   CL 107 11/10/2019 0434   CO2 24 11/10/2019 0434   GLUCOSE 154 (H) 11/10/2019 0434   BUN 70 (H) 11/10/2019 0434   CREATININE 4.81 (H) 11/10/2019 0434   CALCIUM 8.3 (L) 11/10/2019 0434   CALCIUM 9.1 11/06/2019 1238   GFRNONAA 12 (L) 11/10/2019 0434   GFRAA 14 (L) 11/10/2019 0434    COAGS: Lab Results  Component Value  Date   INR 1.0 11/05/2019   INR 1.0 08/30/2019   INR 1.0 12/15/2018     Non-Invasive Vascular Imaging:   Vein mapping ordered but not yet performed   ASSESSMENT/PLAN: This is a 59 y.o. male with end-stage renal disease now requiring hemodialysis; he is being seen in consultation for evaluation for permanent access placement  Patient is right arm dominant and would prefer access placement in left arm Vein mapping is pending Plan will be for left arm AV fistula versus graft later this week schedule permitting On-call vascular surgeon Dr. Scot Dock will evaluate the patient later today and provide further treatment plans   Dagoberto Ligas PA-C [facility] [phone]

## 2019-11-10 NOTE — TOC Progression Note (Signed)
Transition of Care [facility]) - Progression Note    Patient Details  Name: Zachary Young MRN: [id number] Date of Birth: 15-Nov-1960  Transition of Care [facility]) CM/SW [place], [place] Phone Number: 11/10/2019, 1:46 PM  Clinical Narrative:    Additional call left for pt probation officer at [phone] per sisters request.   Expected Discharge Plan and Services Discharge Planning Services: CM Consult Living arrangements for the past 2 months: Single Family Home  Readmission Risk Interventions No flowsheet data found.

## 2019-11-10 NOTE — Procedures (Signed)
I was present at this dialysis session. I have reviewed the session itself and made appropriate changes.   3K, 300/500. UF to 3L.  Tol well. BP s table. Next HD 7/28 on MWF schedule.3K 3-4L UF, 3.5h, Tight hep, 400/600  CLIP / SNF Search in process  Will ask VVS to eval for AVF.      Filed Weights   11/06/19 1223 11/07/19 0457 11/10/19 0653  Weight: (!) 128.9 kg (!) 128 kg (!) 131 kg    Recent Labs  Lab 11/09/19 0212 11/09/19 0706 11/10/19 0434  NA   < >  --  140  K   < >  --  4.1  CL   < >  --  107  CO2   < >  --  24  GLUCOSE   < >  --  154*  BUN   < >  --  70*  CREATININE   < >  --  4.81*  CALCIUM   < >  --  8.3*  PHOS  --  3.4  --    < > = values in this interval not displayed.    Recent Labs  Lab 11/06/19 0230 11/09/19 0212 11/10/19 0434  WBC 7.1 7.7 8.1  NEUTROABS 3.9 4.0  --   HGB 8.8* 9.1* 8.9*  HCT 28.4* 29.7* 29.3*  MCV 85.3 85.8 86.2  PLT 356 295 299    Scheduled Meds: . aspirin EC  81 mg Oral Daily  . Chlorhexidine Gluconate Cloth  6 each Topical Q0600  . collagenase   Topical Daily  . docusate sodium  100 mg Oral BID  . gabapentin  200 mg Oral BID  . insulin aspart  0-9 Units Subcutaneous TID WC  . insulin aspart  4 Units Subcutaneous TID WC  . insulin glargine  15 Units Subcutaneous QHS  . multivitamin with minerals  1 tablet Oral Daily  . nutrition supplement (JUVEN)  1 packet Oral BID BM  . prochlorperazine  10 mg Intravenous QHS  . Ensure Max Protein  11 oz Oral BID  . sodium chloride flush  3 mL Intravenous Q12H   Continuous Infusions: . ferric gluconate (FERRLECIT/NULECIT) IV 125 mg (11/09/19 1130)   PRN Meds:.albuterol, HYDROmorphone (DILAUDID) injection, hydrOXYzine, ondansetron **OR** ondansetron (ZOFRAN) IV, oxyCODONE-acetaminophen, prochlorperazine   Pearson Grippe  MD 11/10/2019, 8:12 AM

## 2019-11-10 NOTE — Progress Notes (Signed)
°   11/10/19 0800  PT Visit Information  Last PT Received On 11/10/19  Reason Eval/Treat Not Completed Patient at procedure or test/unavailable   Pt is in HD, will re-attempt as time and pt allow.  Mee Hives, PT MS Acute Rehab Dept. Number: [place]

## 2019-11-10 NOTE — Progress Notes (Signed)
Patient returned from HD, he refused VS as well as his meds. He stated "I just want to eat my lunch and be left alone." He also voices that "I don't know why my sister come in here and clean my room". RN ask him he would like to have his sister (s) remove from visiting him. He said "No" at this time.

## 2019-11-11 ENCOUNTER — Inpatient Hospital Stay (HOSPITAL_COMMUNITY): Payer: Medicaid Other

## 2019-11-11 DIAGNOSIS — F1410 Cocaine abuse, uncomplicated: Secondary | ICD-10-CM | POA: Diagnosis not present

## 2019-11-11 DIAGNOSIS — N186 End stage renal disease: Secondary | ICD-10-CM | POA: Diagnosis not present

## 2019-11-11 DIAGNOSIS — I10 Essential (primary) hypertension: Secondary | ICD-10-CM | POA: Diagnosis not present

## 2019-11-11 DIAGNOSIS — S91301A Unspecified open wound, right foot, initial encounter: Secondary | ICD-10-CM | POA: Diagnosis not present

## 2019-11-11 DIAGNOSIS — E1152 Type 2 diabetes mellitus with diabetic peripheral angiopathy with gangrene: Secondary | ICD-10-CM | POA: Diagnosis not present

## 2019-11-11 LAB — BASIC METABOLIC PANEL
Anion gap: 10 (ref 5–15)
BUN: 46 mg/dL — ABNORMAL HIGH (ref 6–20)
CO2: 24 mmol/L (ref 22–32)
Calcium: 8.2 mg/dL — ABNORMAL LOW (ref 8.9–10.3)
Chloride: 105 mmol/L (ref 98–111)
Creatinine, Ser: 3.84 mg/dL — ABNORMAL HIGH (ref 0.61–1.24)
GFR calc Af Amer: 19 mL/min — ABNORMAL LOW (ref >60)
GFR calc non Af Amer: 16 mL/min — ABNORMAL LOW (ref >60)
Glucose, Bld: 122 mg/dL — ABNORMAL HIGH (ref 70–99)
Potassium: 3.9 mmol/L (ref 3.5–5.1)
Sodium: 139 mmol/L (ref 135–145)

## 2019-11-11 LAB — GLUCOSE, CAPILLARY
Glucose-Capillary: 173 mg/dL — ABNORMAL HIGH (ref 70–99)
Glucose-Capillary: 142 mg/dL — ABNORMAL HIGH (ref 70–99)
Glucose-Capillary: 130 mg/dL — ABNORMAL HIGH (ref 70–99)
Glucose-Capillary: 131 mg/dL — ABNORMAL HIGH (ref 70–99)

## 2019-11-11 MED ORDER — CEFAZOLIN SODIUM-DEXTROSE 2-4 GM/100ML-% IV SOLN: 2.0000 g | INTRAVENOUS | Status: DC

## 2019-11-11 NOTE — Progress Notes (Signed)
Upper extremity mapping has been completed.   Preliminary results in CV Proc.   Abram Sander 11/11/2019 9:53 AM

## 2019-11-11 NOTE — Progress Notes (Signed)
Physical Therapy Treatment Patient Details Name: Zachary Young MRN: [id number] DOB: 10/16/1960 Today's Date: 11/11/2019    History of Present Illness Pt is a 59 y/o male s/p R transtibial amputation. PMH including but not limited to CHF, DM, HTN, L transmet amputation. Pt now s/p HD catheter placement with plan for starting HD while admitted.     PT Comments    Pt seen at the request of staff to assist with transfer back to bed as transport had arrived to take pt for a vascular procedure. Pt remains very unsafe with mobility, and demonstrates poor safety awareness and motor planning. Min guard for safety was provided for a lateral scoot transfer from chair to bed. Pt initially on 3L of O2 at beginning of session but took it off himself during session. Pt stating that he didn't need it. SpO2 was assessed, 98% on RA. Pt would continue to benefit from skilled physical therapy services at this time while admitted and after d/c to address the below listed limitations in order to improve overall safety and independence with functional mobility.   Follow Up Recommendations  SNF     Equipment Recommendations  Wheelchair (measurements PT);Wheelchair cushion (measurements PT);Other (comment) (with amputee attachment)    Recommendations for Other Services       Precautions / Restrictions Precautions Precautions: Fall Precaution Comments: R limb protector Required Braces or Orthoses: Other Brace Other Brace: R limb protector Restrictions Weight Bearing Restrictions: Yes RLE Weight Bearing: Non weight bearing    Mobility  Bed Mobility Overal bed mobility: Modified Independent                Transfers Overall transfer level: Needs assistance Equipment used: None Transfers: Lateral/Scoot Transfers          Lateral/Scoot Transfers: Min guard General transfer comment: for safety; pt initially attempting to stand with RW and pivot to bed towards his R side. Refusing to allow  therapist to adjust his chair to allow him to pivot towards his L side. Therapist then suggested a lateral scoot transfer from drop arm recliner chair to bed. Pt agreeable. Min guard for safety  Ambulation/Gait             General Gait Details: deferred as transport was present to take him to vascular   Stairs             Wheelchair Mobility    Modified Rankin (Stroke Patients Only)       Balance Overall balance assessment: Needs assistance Sitting-balance support: No upper extremity supported;Feet supported Sitting balance-Leahy Scale: Fair                                      Cognition Arousal/Alertness: Awake/alert Behavior During Therapy: WFL for tasks assessed/performed Overall Cognitive Status: Impaired/Different from baseline Area of Impairment: Safety/judgement;Problem solving                         Safety/Judgement: Decreased awareness of safety;Decreased awareness of deficits   Problem Solving: Requires verbal cues        Exercises      General Comments        Pertinent Vitals/Pain Pain Assessment: No/denies pain    Home Living                      Prior Function  PT Goals (current goals can now be found in the care plan section) Acute Rehab PT Goals PT Goal Formulation: With patient Time For Goal Achievement: 11/17/19 Potential to Achieve Goals: Fair Progress towards PT goals: Progressing toward goals    Frequency    Min 3X/week      PT Plan Current plan remains appropriate    Co-evaluation              AM-PAC PT "6 Clicks" Mobility   Outcome Measure  Help needed turning from your back to your side while in a flat bed without using bedrails?: None Help needed moving from lying on your back to sitting on the side of a flat bed without using bedrails?: None Help needed moving to and from a bed to a chair (including a wheelchair)?: A Little Help needed standing up from a  chair using your arms (e.g., wheelchair or bedside chair)?: A Little Help needed to walk in hospital room?: A Lot Help needed climbing 3-5 steps with a railing? : Total 6 Click Score: 17    End of Session   Activity Tolerance: Patient tolerated treatment well Patient left: in bed;Other (comment) (transport present to take pt to vascular) Nurse Communication: Mobility status PT Visit Diagnosis: Other abnormalities of gait and mobility (R26.89)     Time: 5364-6803 PT Time Calculation (min) (ACUTE ONLY): 10 min  Charges:  $Therapeutic Activity: 8-22 mins                     Anastasio Champion, DPT  Acute Rehabilitation Services Pager [phone] Office [place] 11/11/2019, 10:17 AM

## 2019-11-11 NOTE — Progress Notes (Signed)
   VASCULAR SURGERY ASSESSMENT & PLAN:   ESRD: His vein map is pending.  The patient is scheduled for a Left AVF/AVG on Friday.  I believe his dialysis schedule is Tuesday Thursday Saturday here in the hospital so that should not interfere with his surgery on Friday.  I have written to move his IV to the right arm.  SUBJECTIVE:   No complaints this morning except that he wants to go home  PHYSICAL EXAM:   Vitals:   11/10/19 1026 11/10/19 1030 11/10/19 1608 11/11/19 0506  BP: (!) 132/73 119/77 (!) 120/52 (!) 122/61  Pulse: 77 77 73 76  Resp:  18 18 17   Temp:  98.5 F (36.9 C) 98.4 F (36.9 C) 98.2 F (36.8 C)  TempSrc:  Oral Oral Oral  SpO2:  99% 100% 99%  Weight:  (!) 129.7 kg    Height:       Palpable left radial pulse. He still has an IV in the left forearm.  I have ordered to remove this.  LABS:   Lab Results  Component Value Date   WBC 8.1 11/10/2019   HGB 8.9 (L) 11/10/2019   HCT 29.3 (L) 11/10/2019   MCV 86.2 11/10/2019   PLT 299 11/10/2019   Lab Results  Component Value Date   CREATININE 4.81 (H) 11/10/2019   CBG (last 3)  Recent Labs    11/10/19 1559 11/10/19 2144 11/11/19 0504  GLUCAP 176* 149* 173*    PROBLEM LIST:    Active Problems:   Hypertensive heart disease with CHF (congestive heart failure) (HCC)   Chronic kidney disease, stage 4 (severe) (HCC)   HLD (hyperlipidemia)   HTN (hypertension)   Systolic and diastolic CHF, chronic (HCC)   Tobacco use disorder   Type 2 diabetes mellitus with circulatory disorder, with long-term current use of insulin (HCC)   Wound of right foot   Syncope and collapse   Cocaine abuse ([place])   Subacute osteomyelitis, right ankle and foot (HCC)   Abscess of right foot   Diabetic neuropathy (HCC)   Anemia of chronic disease   Severe obesity (BMI 35.0-39.9) with comorbidity ([place])   Metabolic acidosis   DM (diabetes mellitus), secondary, uncontrolled, with complications (HCC)   Elevated sedimentation rate    Elevated C-reactive protein (CRP)   Hypoalbuminemia   Goals of care, counseling/discussion   Palliative care by specialist   CURRENT MEDS:   . aspirin EC  81 mg Oral Daily  . Chlorhexidine Gluconate Cloth  6 each Topical Q0600  . collagenase   Topical Daily  . docusate sodium  100 mg Oral BID  . gabapentin  200 mg Oral BID  . insulin aspart  0-9 Units Subcutaneous TID WC  . insulin aspart  4 Units Subcutaneous TID WC  . insulin glargine  15 Units Subcutaneous QHS  . multivitamin with minerals  1 tablet Oral Daily  . nutrition supplement (JUVEN)  1 packet Oral BID BM  . prochlorperazine  10 mg Intravenous QHS  . Ensure Max Protein  11 oz Oral BID  . sodium chloride flush  3 mL Intravenous Q12H    Deitra Mayo Office: [phone] 11/11/2019

## 2019-11-11 NOTE — Progress Notes (Signed)
Admit: 10/17/2019 LOS: 25  38M new ESRD, several social barriers  Subjective:   NAEO  VVS for AVF on Friday  HD yesterday 2.2L  07/27 0701 - 07/28 0700 In: 720 [P.O.:720] Out: 1498   Filed Weights   11/07/19 0457 11/10/19 0653 11/10/19 1030  Weight: (!) 128 kg (!) 131 kg (!) 129.7 kg    Scheduled Meds: . aspirin EC  81 mg Oral Daily  . Chlorhexidine Gluconate Cloth  6 each Topical Q0600  . collagenase   Topical Daily  . docusate sodium  100 mg Oral BID  . gabapentin  200 mg Oral BID  . insulin aspart  0-9 Units Subcutaneous TID WC  . insulin aspart  4 Units Subcutaneous TID WC  . insulin glargine  15 Units Subcutaneous QHS  . multivitamin with minerals  1 tablet Oral Daily  . nutrition supplement (JUVEN)  1 packet Oral BID BM  . prochlorperazine  10 mg Intravenous QHS  . Ensure Max Protein  11 oz Oral BID  . sodium chloride flush  3 mL Intravenous Q12H   Continuous Infusions: . ferric gluconate (FERRLECIT/NULECIT) IV 125 mg (11/11/19 1258)   PRN Meds:.albuterol, HYDROmorphone (DILAUDID) injection, hydrOXYzine, ondansetron **OR** ondansetron (ZOFRAN) IV, oxyCODONE-acetaminophen, prochlorperazine  Current Labs: reviewed    Physical Exam:  Blood pressure (!) 144/74, pulse 73, temperature 98 F (36.7 C), temperature source Oral, resp. rate 17, height 6\' 3"  (1.905 m), weight (!) 129.7 kg, SpO2 98 %. NAD R IJ TDC C/D/I RRR CTAB RLE amputation, LLE 2+edema  A 1. New ESRD started 7/23 via [place] 1. CLIP in process, dep't upon SNF, could be difficult 2. Moving to THS schedule 3. VVS following for AVF on Friday 2. Metabolic Acidosis, resolved 3. Hyperkalemia, resolved 4. Homeless 5. Cocaine use 6. DM2 7. Anemia, on IV Fe, ESA, Hb stable 8. CKD-BMD: Ca and P ok  P . HD tomorrow on THS schedule . AV access Friday . CLIP / SNF in process . Medication Issues; o Preferred narcotic agents for pain control are hydromorphone, fentanyl, and methadone. Morphine should  not be used.  o Baclofen should be avoided o Avoid oral sodium phosphate and magnesium citrate based laxatives / bowel preps    Pearson Grippe MD 11/11/2019, 2:03 PM  Recent Labs  Lab 11/06/19 0551 11/06/19 1238 11/06/19 1645 11/07/19 0218 11/09/19 4970 11/09/19 0706 11/10/19 0434 11/11/19 0702  NA 131*   < > 136   < > 138  --  140 139  K 4.1   < > 4.4   < > 4.4  --  4.1 3.9  CL 97*   < > 105   < > 105  --  107 105  CO2 27   < > 19*   < > 24  --  24 24  GLUCOSE 332*   < > 182*   < > 328*  --  154* 122*  BUN 16   < > 88*   < > 72*  --  70* 46*  CREATININE 0.96   < > 5.27*   < > 5.14*  --  4.81* 3.84*  CALCIUM 8.7*   < > 9.2   < > 8.7*  --  8.3* 8.2*  PHOS 3.0  --  5.2*  --   --  3.4  --   --    < > = values in this interval not displayed.   Recent Labs  Lab 11/06/19 0230 11/09/19 0212 11/10/19 0434  WBC 7.1  7.7 8.1  NEUTROABS 3.9 4.0  --   HGB 8.8* 9.1* 8.9*  HCT 28.4* 29.7* 29.3*  MCV 85.3 85.8 86.2  PLT 356 295 299

## 2019-11-11 NOTE — Progress Notes (Signed)
OT Cancellation Note  Patient Details Name: Zachary Young MRN: [id number] DOB: 07-Jun-1960   Cancelled Treatment:    Reason Eval/Treat Not Completed: Patient declined, no reason specified.  Patient refusing, stating he has things to do at 8:30am tomorrow morning and cannot participate.  Adamant about not working with therapy today.  Will follow up later this week.    August Luz, OTR/L    Phylliss Bob 11/11/2019, 1:16 PM

## 2019-11-11 NOTE — Progress Notes (Signed)
HD orders modified to 11/12/2019 per Dr  Joelyn Oms, notified Alma Friendly, [place] at [phone] of above

## 2019-11-11 NOTE — Progress Notes (Signed)
PROGRESS NOTE  Zachary Young SFK:[id number] DOB: 10/28/1960 DOA: 10/17/2019 PCP: System, Pcp Not In  HPI/Recap of past 24 hours:  [name] PMHx Post right below the knee amputation on 10/21/2019 by orthopedic surgery Dr. Sharol Given. Hospital course complicated by worsening renal function in the setting of progressive CKD V for which nephrology was consulted.  Has very poor insight into his medical condition.  His 2 sisters Zachary Young and Zachary Young are his medical decision makers.  Per his sister Zachary Young via phone he is presently homeless.  He has difficulty understanding the severity of his medical condition.  Her along with her sister Zachary Young are making medical decisions for him.    Patient is on parole and his sister Zachary Young is working to lift it up in order to facilitate his placement to SNF.    Post right IJ TDC placement by IR on 11/05/2019.  1st HD completed on 11/06/2019.  2nd HD planned on 11/07/2019.  3rd HD completed on 11/10/2019.  Difficult placement, TOC assisting with SNF placement.     Assessment/Plan: Active Problems:   Hypertensive heart disease with CHF (congestive heart failure) (HCC)   Chronic kidney disease, stage 4 (severe) (HCC)   HLD (hyperlipidemia)   HTN (hypertension)   Systolic and diastolic CHF, chronic (HCC)   Tobacco use disorder   Type 2 diabetes mellitus with circulatory disorder, with long-term current use of insulin (HCC)   Wound of right foot   Syncope and collapse   Cocaine abuse ([place])   Subacute osteomyelitis, right ankle and foot (HCC)   Abscess of right foot   Diabetic neuropathy (HCC)   Anemia of chronic disease   Severe obesity (BMI 35.0-39.9) with comorbidity ([place])   Metabolic acidosis   DM (diabetes mellitus), secondary, uncontrolled, with complications (HCC)   Elevated sedimentation rate   Elevated C-reactive protein (CRP)   Hypoalbuminemia   Goals of care, counseling/discussion   Palliative care by specialist   Right foot wound status post BKA on  10/21/19 by Dr. Clarita Leber on the residual limb and had some bleeding on 7/21. Seen by ortho on 7/22, no wound dehiscence Ortho recommends to hold SQ lovenox until the hematoma has resolved.  Will follow up with Ortho as an outpatient. PT has recommended SNF, difficult placement due to complicated social issues, on parole.  Progressive CKD V, now ESRD  Presented with nonoliguric acute renal failure on chronic kidney disease stage IV, hyperkalemia, with evidence of early uremic symptoms.  Post right IJ TDC placement by IR Dr. Earleen Newport, on 11/05/2019 1st session of hemodialysis on 11/06/2019 2nd session of hemodialysis on 11/07/2019 3rd session 11/10/19 Will need HD spot prior to DC, TOC working on SNF placement Highly appreciate nephrology's assistance  Transient intractable nausea and vomiting Suspect secondary to uremia in the setting of progressive CKD V  Continue hemodialysis  Diabetes mellitus type 2, uncontrolled with hyperglycemia and renal complications FVC9S 8.8.  Reduced doses of insulin to avoid hypoglycemia in the setting of new ESRD Lantus reduced to 10 U nightly NovoLog reduced to 4 units 3 times daily Continue with insulin sliding scale, sensitive.  Avoid hypoglycemia  Syncope, no recurrence Likely multifactorial including overdiuresis and acute kidney injury and acute infection.  No further episodes in the hospital.  Continue to monitor  Chronic diastolic CHF EF 50 to 49% based on echocardiogram done in May.  Volume status addressed with hemodialysis Continue to monitor daily weight  Hypotension, currently blood pressures are soft Hypotensive at dialysis Hold off  Norvasc  Maintain MAP greater than 65 Continue to monitor vital signs  Anemia of chronic kidney disease/acute blood loss-bled from his stump, resolved. Hemoglobin is 8.8 from 9.8 On 10 CBC in the morning  Acute blood loss anemia from bleeding stump 7/21, and 7/22. Seen by orthopedic surgery,  Dr. Sharol Given, on 7/22, recommended to hold off Lovenox until hematoma has resolved Hg as reported above Continue to monitor H&H  History of cocaine abuse His urine drug screen from 7/4 was positive for cocaine Cessation counseling done at bedside  Obesity .Body mass index is 35.74 kg/m.  Behavioral issues Patient has exhibited belligerent behavior with staff. He becomes verbally abusive. Appears to have very poor understanding of his multiple medical problems.  His sisters Zachary Young and Zachary Young are his medical decision makers.  Goals of care Seen by palliative care previously.  Palliative care team reconsulted to assist with establishing goals of care, appreciate assistance. His sisters Zachary Young and Zachary Young are his medical decision makers. DNR as of 11/03/2019   DVT Prophylaxis:SCDs Code Status:DNR Disposition Plan:Status is: Inpatient    Status is: Inpatient  Remains inpatient appropriate because:Inpatient level of care appropriate due to severity of illness   Dispo:             Patient From: Home             Planned Disposition: To be determined              Expected discharge date: unclear due to placement issues             Medically not stable for discharge due to now ESRD on hemodialysis requiring a stable disposition and an HD spot prior to discharge.       Objective: Vitals:   11/10/19 1030 11/10/19 1608 11/11/19 0506 11/11/19 1224  BP: 119/77 (!) 120/52 (!) 122/61 (!) 144/74  Pulse: 77 73 76 73  Resp: 18 18 17 17   Temp: 98.5 F (36.9 C) 98.4 F (36.9 C) 98.2 F (36.8 C) 98 F (36.7 C)  TempSrc: Oral Oral Oral Oral  SpO2: 99% 100% 99% 98%  Weight: (!) 129.7 kg     Height:        Intake/Output Summary (Last 24 hours) at 11/11/2019 1417 Last data filed at 11/11/2019 1343 Gross per 24 hour  Intake 480 ml  Output 150 ml  Net 330 ml   Filed Weights   11/07/19 0457 11/10/19 0653 11/10/19 1030  Weight: (!) 128 kg (!) 131 kg (!) 129.7 kg     . Irritated about people coming in and out of his room  Refused exam   Data Reviewed: CBC: Recent Labs  Lab 11/05/19 0724 11/06/19 0230 11/09/19 0212 11/10/19 0434  WBC 8.1 7.1 7.7 8.1  NEUTROABS  --  3.9 4.0  --   HGB 9.8* 8.8* 9.1* 8.9*  HCT 32.7* 28.4* 29.7* 29.3*  MCV 84.9 85.3 85.8 86.2  PLT 375 356 295 563   Basic Metabolic Panel: Recent Labs  Lab 11/06/19 0551 11/06/19 1238 11/06/19 1645 11/06/19 1645 11/07/19 0218 11/08/19 0442 11/09/19 0212 11/09/19 0706 11/10/19 0434 11/11/19 0702  NA 131*   < > 136   < > 140 136 138  --  140 139  K 4.1   < > 4.4   < > 4.3 4.4 4.4  --  4.1 3.9  CL 97*   < > 105   < > 107 104 105  --  107 105  CO2 27   < >  19*   < > 23 22 24   --  24 24  GLUCOSE 332*   < > 182*   < > 81 175* 328*  --  154* 122*  BUN 16   < > 88*   < > 75* 63* 72*  --  70* 46*  CREATININE 0.96   < > 5.27*   < > 4.98* 4.19* 5.14*  --  4.81* 3.84*  CALCIUM 8.7*   < > 9.2   < > 9.5 8.9 8.7*  --  8.3* 8.2*  PHOS 3.0  --  5.2*  --   --   --   --  3.4  --   --    < > = values in this interval not displayed.   GFR: Estimated Creatinine Clearance: 30.8 mL/min (A) (by C-G formula based on SCr of 3.84 mg/dL (H)). Liver Function Tests: Recent Labs  Lab 11/06/19 0551 11/06/19 1645  ALBUMIN 2.6* 2.5*   No results for input(s): LIPASE, AMYLASE in the last 168 hours. No results for input(s): AMMONIA in the last 168 hours. Coagulation Profile: Recent Labs  Lab 11/05/19 1221  INR 1.0   Cardiac Enzymes: No results for input(s): CKTOTAL, CKMB, CKMBINDEX, TROPONINI in the last 168 hours. BNP (last 3 results) No results for input(s): PROBNP in the last 8760 hours. HbA1C: No results for input(s): HGBA1C in the last 72 hours. CBG: Recent Labs  Lab 11/10/19 1102 11/10/19 1559 11/10/19 2144 11/11/19 0504 11/11/19 1055  GLUCAP 133* 176* 149* 173* 142*   Lipid Profile: No results for input(s): CHOL, HDL, LDLCALC, TRIG, CHOLHDL, LDLDIRECT in the last 72  hours. Thyroid Function Tests: No results for input(s): TSH, T4TOTAL, FREET4, T3FREE, THYROIDAB in the last 72 hours. Anemia Panel: No results for input(s): VITAMINB12, FOLATE, FERRITIN, TIBC, IRON, RETICCTPCT in the last 72 hours. Urine analysis:    Component Value Date/Time   COLORURINE YELLOW 08/15/2019 1538   APPEARANCEUR CLEAR 08/15/2019 1538   LABSPEC 1.010 08/15/2019 1538   PHURINE 5.0 08/15/2019 1538   GLUCOSEU NEGATIVE 08/15/2019 1538   HGBUR SMALL (A) 08/15/2019 1538   BILIRUBINUR NEGATIVE 08/15/2019 1538   KETONESUR NEGATIVE 08/15/2019 1538   PROTEINUR >=300 (A) 08/15/2019 1538   NITRITE NEGATIVE 08/15/2019 1538   LEUKOCYTESUR NEGATIVE 08/15/2019 1538     )No results found for this or any previous visit (from the past 240 hour(s)).    Studies: VAS [place] UPPER EXT VEIN MAPPING (PRE-OP AVF)  Result Date: 11/11/2019 UPPER EXTREMITY VEIN MAPPING  Indications: Pre-access. Performing Technologist: Abram Sander RVS  Examination Guidelines: A complete evaluation includes B-mode imaging, spectral Doppler, color Doppler, and power Doppler as needed of all accessible portions of each vessel. Bilateral testing is considered an integral part of a complete examination. Limited examinations for reoccurring indications may be performed as noted. +-----------------+-------------+----------+--------------+ Right Cephalic   Diameter (cm)Depth (cm)   Findings    +-----------------+-------------+----------+--------------+ Shoulder             0.33        0.39                  +-----------------+-------------+----------+--------------+ Prox upper arm       0.41        0.24                  +-----------------+-------------+----------+--------------+ Mid upper arm        0.27        0.24                  +-----------------+-------------+----------+--------------+  Dist upper arm       0.17        0.25     branching    +-----------------+-------------+----------+--------------+  Antecubital fossa    0.16        0.18                  +-----------------+-------------+----------+--------------+ Prox forearm         0.20        0.25     branching    +-----------------+-------------+----------+--------------+ Dist forearm                            not visualized +-----------------+-------------+----------+--------------+ Wrist                                   not visualized +-----------------+-------------+----------+--------------+ +-----------------+-------------+----------+---------+ Right Basilic    Diameter (cm)Depth (cm)Findings  +-----------------+-------------+----------+---------+ Prox upper arm       0.36        1.19             +-----------------+-------------+----------+---------+ Mid upper arm        0.43        1.16             +-----------------+-------------+----------+---------+ Dist upper arm       0.40        1.00             +-----------------+-------------+----------+---------+ Antecubital fossa    0.27        0.83   branching +-----------------+-------------+----------+---------+ Prox forearm         0.34        0.75             +-----------------+-------------+----------+---------+ Mid forearm          0.22        0.66             +-----------------+-------------+----------+---------+ Distal forearm       0.17        0.56             +-----------------+-------------+----------+---------+ Wrist                0.14        0.46             +-----------------+-------------+----------+---------+ +-----------------+-------------+----------+--------------+ Left Cephalic    Diameter (cm)Depth (cm)   Findings    +-----------------+-------------+----------+--------------+ Shoulder                                not visualized +-----------------+-------------+----------+--------------+ Prox upper arm                          not visualized +-----------------+-------------+----------+--------------+ Mid  upper arm                           not visualized +-----------------+-------------+----------+--------------+ Dist upper arm                          not visualized +-----------------+-------------+----------+--------------+ Antecubital fossa                       not visualized +-----------------+-------------+----------+--------------+ Prox forearm  not visualized +-----------------+-------------+----------+--------------+ Mid forearm                             not visualized +-----------------+-------------+----------+--------------+ Dist forearm                            not visualized +-----------------+-------------+----------+--------------+ Wrist                                   not visualized +-----------------+-------------+----------+--------------+ +-----------------+-------------+----------+--------+ Left Basilic     Diameter (cm)Depth (cm)Findings +-----------------+-------------+----------+--------+ Prox upper arm       0.79        1.67            +-----------------+-------------+----------+--------+ Mid upper arm        0.56        1.83            +-----------------+-------------+----------+--------+ Dist upper arm       0.51        1.15            +-----------------+-------------+----------+--------+ Antecubital fossa    0.25        0.62            +-----------------+-------------+----------+--------+ Prox forearm         0.33        0.38            +-----------------+-------------+----------+--------+ Mid forearm          0.20        0.21            +-----------------+-------------+----------+--------+ Distal forearm       0.13        0.21            +-----------------+-------------+----------+--------+ *See table(s) above for measurements and observations.  Diagnosing physician:    Preliminary     Scheduled Meds: . aspirin EC  81 mg Oral Daily  . Chlorhexidine Gluconate Cloth  6 each  Topical Q0600  . collagenase   Topical Daily  . docusate sodium  100 mg Oral BID  . gabapentin  200 mg Oral BID  . insulin aspart  0-9 Units Subcutaneous TID WC  . insulin aspart  4 Units Subcutaneous TID WC  . insulin glargine  15 Units Subcutaneous QHS  . multivitamin with minerals  1 tablet Oral Daily  . nutrition supplement (JUVEN)  1 packet Oral BID BM  . prochlorperazine  10 mg Intravenous QHS  . Ensure Max Protein  11 oz Oral BID  . sodium chloride flush  3 mL Intravenous Q12H    Continuous Infusions: . ferric gluconate (FERRLECIT/NULECIT) IV 125 mg (11/11/19 1258)     LOS: 25 days     Geradine Girt, DO [facility]  If 7PM-7AM, please contact night-coverage  11/11/2019, 2:17 PM

## 2019-11-11 NOTE — Progress Notes (Signed)
Nutrition Follow-up  DOCUMENTATION CODES:   Obesity unspecified  INTERVENTION:  ContinueEnsure Max poBID, each supplement provides 150 kcal and 30 grams of protein.  ContinueJuven BID, each packet provides 95 calories, 2.5 grams of protein (collagen)for wound healing.  NUTRITION DIAGNOSIS:   Increased nutrient needs related to wound healing as evidenced by estimated needs; ongoing  GOAL:   Patient will meet greater than or equal to 90% of their needs; met  MONITOR:   PO intake, Supplement acceptance, Skin, Labs, Weight trends, I & O's  REASON FOR ASSESSMENT:   Consult Wound healing  ASSESSMENT:   Pt presented with wound of R foot and hyperglycemia. PMH significant for kidney disease, T2DM, CHF, HTN, diabetic neuropathy Procedure (7/7): Right BKA.   7/22 - Right IJ approach tunneled HD catheter placed  HD initiated 7/23. Meal completion has been 100%. Pt currently has Ensure Max and Juven ordered. RD to continue with current orders to aid in caloric and protein needs as well as in healing. Labs and medications reviewed.   Diet Order:   Diet Order            Diet renal/carb modified with fluid restriction Diet-HS Snack? Nothing; Fluid restriction: 1200 mL Fluid; Room service appropriate? Yes; Fluid consistency: Thin  Diet effective now                 EDUCATION NEEDS:   No education needs have been identified at this time  Skin:  Skin Assessment: Reviewed RN Assessment Skin Integrity Issues:: Incisions Wound Vac: N/A Diabetic Ulcer: N/A Incisions: R leg Other: amputation - all toes  Last BM:  7/27  Height:   Ht Readings from Last 1 Encounters:  10/21/19 _0  (1.905 m)    Weight:   Wt Readings from Last 1 Encounters:  11/10/19 (!) 129.7 kg   BMI:  Body mass index is 35.74 kg/m.  Estimated Nutritional Needs:   Kcal:  2400-2600  Protein:  130-140 grams  Fluid:  >2L/d  Corrin Parker, MS, RD, LDN RD pager number/after hours weekend  pager number on Amion.

## 2019-11-12 DIAGNOSIS — N185 Chronic kidney disease, stage 5: Secondary | ICD-10-CM

## 2019-11-12 LAB — CBC
HCT: 28.3 % — ABNORMAL LOW (ref 39.0–52.0)
Hemoglobin: 8.4 g/dL — ABNORMAL LOW (ref 13.0–17.0)
MCH: 25.5 pg — ABNORMAL LOW (ref 26.0–34.0)
MCHC: 29.7 g/dL — ABNORMAL LOW (ref 30.0–36.0)
MCV: 86 fL (ref 80.0–100.0)
Platelets: 258 10*3/uL (ref 150–400)
RBC: 3.29 MIL/uL — ABNORMAL LOW (ref 4.22–5.81)
RDW: 15.6 % — ABNORMAL HIGH (ref 11.5–15.5)
WBC: 6.9 10*3/uL (ref 4.0–10.5)
nRBC: 0 % (ref 0.0–0.2)

## 2019-11-12 LAB — GLUCOSE, CAPILLARY
Glucose-Capillary: 99 mg/dL (ref 70–99)
Glucose-Capillary: 118 mg/dL — ABNORMAL HIGH (ref 70–99)
Glucose-Capillary: 139 mg/dL — ABNORMAL HIGH (ref 70–99)
Glucose-Capillary: 105 mg/dL — ABNORMAL HIGH (ref 70–99)

## 2019-11-12 LAB — BASIC METABOLIC PANEL
Anion gap: 9 (ref 5–15)
BUN: 54 mg/dL — ABNORMAL HIGH (ref 6–20)
CO2: 25 mmol/L (ref 22–32)
Calcium: 8.2 mg/dL — ABNORMAL LOW (ref 8.9–10.3)
Chloride: 104 mmol/L (ref 98–111)
Creatinine, Ser: 4.38 mg/dL — ABNORMAL HIGH (ref 0.61–1.24)
GFR calc Af Amer: 16 mL/min — ABNORMAL LOW (ref >60)
GFR calc non Af Amer: 14 mL/min — ABNORMAL LOW (ref >60)
Glucose, Bld: 158 mg/dL — ABNORMAL HIGH (ref 70–99)
Potassium: 3.9 mmol/L (ref 3.5–5.1)
Sodium: 138 mmol/L (ref 135–145)

## 2019-11-12 MED ORDER — CEFAZOLIN SODIUM-DEXTROSE 2-4 GM/100ML-% IV SOLN
2.0000 g | INTRAVENOUS | Status: AC
  Administered 2019-11-13: 2 g via INTRAVENOUS
  Filled 2019-11-12: qty 100

## 2019-11-12 NOTE — NC FL2 (Signed)
[place] LEVEL OF CARE SCREENING TOOL     IDENTIFICATION  Patient Name: Zachary Young Birthdate: [birth year] Sex: male Admission Date (Current Location): 10/17/2019  [facility] and [place] Number:  Engineering geologist and Address:  [facility] [place], [place] [address], [place], [place] [address]      Provider Number: [id number]  Attending Physician Name and Address:  Geradine Girt, DO  Relative Name and Phone Number:       Current Level of Care: Hospital Recommended Level of Care: [place] Prior Approval Number:    Date Approved/Denied:   PASRR Number: [id number]  Discharge Plan: SNF    Current Diagnoses: Patient Active Problem List   Diagnosis Date Noted  . Goals of care, counseling/discussion   . Palliative care by specialist   . Abscess of right foot 10/21/2019  . Diabetic neuropathy ([place]) 10/21/2019  . Anemia of chronic disease 10/21/2019  . Severe obesity (BMI 35.0-39.9) with comorbidity ([place]) 10/21/2019  . Metabolic acidosis 82/99/3716  . DM (diabetes mellitus), secondary, uncontrolled, with complications ([place]) 96/78/9381  . Elevated sedimentation rate 10/21/2019  . Elevated C-reactive protein (CRP) 10/21/2019  . Hypoalbuminemia 10/21/2019  . Subacute osteomyelitis, right ankle and foot ([place])   . Cocaine abuse ([place]) 10/19/2019  . Wound of right foot 10/17/2019  . Acute kidney injury superimposed on chronic kidney disease ([place])   . Syncope and collapse   . Dyspnea   . Abdominal pain   . Hypertensive heart disease with CHF (congestive heart failure) ([place]) 08/15/2019  . Acute hypoxemic respiratory failure ([place])   . Anasarca 06/17/2019  . Gastritis 05/26/2019  . GI bleed 12/14/2018  . Chronic kidney disease, stage 4 (severe) ([place]) 06/26/2018  . Systolic and diastolic CHF, chronic ([place]) 06/26/2018  . Moderate nonproliferative retinopathy due to secondary diabetes ([place]) 04/30/2018  . HLD (hyperlipidemia)  02/28/2018  . History of MI (myocardial infarction) 05/22/2016  . History of osteomyelitis 05/22/2016  . Tobacco use disorder 03/27/2016  . HTN (hypertension) 03/26/2016  . Type 2 diabetes mellitus with circulatory disorder, with long-term current use of insulin ([place]) 03/26/2016  . Toe amputation status 03/17/2014    Orientation RESPIRATION BLADDER Height & Weight     Self, Time, Situation, Place  Normal Incontinent Weight: (!) 285 lb 7.9 oz (129.5 kg) Height:  6\' 3"  (190.5 cm)  BEHAVIORAL SYMPTOMS/MOOD NEUROLOGICAL BOWEL NUTRITION STATUS      Continent Diet (see discharge summary)  AMBULATORY STATUS COMMUNICATION OF NEEDS Skin   Extensive Assist Verbally Surgical wounds (closed incision (BKA) w/ gauze dressing)                       Personal Care Assistance Level of Assistance  Bathing, Feeding, Dressing Bathing Assistance: Maximum assistance Feeding assistance: Independent Dressing Assistance: Maximum assistance     Functional Limitations Info  Sight, Hearing, Speech Sight Info: Adequate Hearing Info: Adequate Speech Info: Adequate    SPECIAL CARE FACTORS FREQUENCY  PT (By licensed PT), OT (By licensed OT)     PT Frequency: 5x week OT Frequency: 5x week            Contractures Contractures Info: Not present    Additional Factors Info  Code Status, Allergies, Insulin Sliding Scale Code Status Info: DNR     Insulin Sliding Scale Info: insulin aspart (novoLOG) injection 0-9 Units 3x daily with meals; insulin aspart (novoLOG) injection 4 Units 3x daily with meals; insulin glargine (LANTUS) injection 15  Units daily at bedtime       Current Medications (11/12/2019):  This is the current hospital active medication list Current Facility-Administered Medications  Medication Dose Route Frequency Provider Last Rate Last Admin  . albuterol (PROVENTIL) (2.5 MG/3ML) 0.083% nebulizer solution 2.5 mg  2.5 mg Nebulization Q4H PRN Persons, Bevely Palmer, PA      . aspirin  EC tablet 81 mg  81 mg Oral Daily Persons, Bevely Palmer, PA   81 mg at 11/12/19 1150  . [START ON 11/13/2019] ceFAZolin (ANCEF) IVPB 2g/100 mL premix  2 g Intravenous To SSTC Dagoberto Ligas, PA-C      . Chlorhexidine Gluconate Cloth 2 % PADS 6 each  6 each Topical Q0600 [name], MD   6 each at 11/11/19 [phone]  . collagenase (SANTYL) ointment   Topical Daily Persons, Bevely Palmer, [place]   Given at 11/04/19 [phone]  . docusate sodium (COLACE) capsule 100 mg  100 mg Oral BID Persons, Bevely Palmer, PA   100 mg at 11/05/19 0935  . ferric gluconate (NULECIT) 125 mg in sodium chloride 0.9 % 100 mL IVPB  125 mg Intravenous Q M,W,F-HD [name], MD 110 mL/hr at 11/11/19 1258 125 mg at 11/11/19 1258  . gabapentin (NEURONTIN) capsule 200 mg  200 mg Oral BID Irene Pap N, DO   200 mg at 11/12/19 1150  . HYDROmorphone (DILAUDID) injection 0.5 mg  0.5 mg Intravenous BID PRN Irene Pap N, DO      . hydrOXYzine (ATARAX/VISTARIL) tablet 25 mg  25 mg Oral Q6H PRN Persons, Bevely Palmer, PA   25 mg at 11/11/19 2150  . insulin aspart (novoLOG) injection 0-9 Units  0-9 Units Subcutaneous TID WC Irene Pap N, DO   1 Units at 11/11/19 1718  . insulin aspart (novoLOG) injection 4 Units  4 Units Subcutaneous TID WC Irene Pap N, DO   4 Units at 11/12/19 1151  . insulin glargine (LANTUS) injection 15 Units  15 Units Subcutaneous QHS Kayleen Memos, DO   15 Units at 11/11/19 2151  . multivitamin with minerals tablet 1 tablet  1 tablet Oral Daily Persons, Bevely Palmer, [place]   1 tablet at 11/12/19 1150  . nutrition supplement (JUVEN) (JUVEN) powder packet 1 packet  1 packet Oral BID BM Persons, Bevely Palmer, [place]   1 packet at 11/12/19 1151  . ondansetron (ZOFRAN) tablet 4 mg  4 mg Oral Q6H PRN Persons, Bevely Palmer, PA   4 mg at 11/08/19 1041   Or  . ondansetron (ZOFRAN) injection 4 mg  4 mg Intravenous Q6H PRN Persons, Bevely Palmer, PA   4 mg at 11/07/19 1617  . oxyCODONE-acetaminophen (PERCOCET/ROXICET) 5-325 MG per tablet 1 tablet  1 tablet  Oral Q6H PRN Irene Pap N, DO   1 tablet at 11/12/19 0641  . prochlorperazine (COMPAZINE) injection 10 mg  10 mg Intravenous Q6H PRN Dellinger, Marianne L, PA-C   10 mg at 11/05/19 1400  . prochlorperazine (COMPAZINE) injection 10 mg  10 mg Intravenous QHS Dellinger, Marianne L, PA-C   10 mg at 11/11/19 2150  . protein supplement (ENSURE MAX) liquid  11 oz Oral BID Allie Bossier, MD   11 oz at 11/11/19 2201  . sodium chloride flush (NS) 0.9 % injection 3 mL  3 mL Intravenous Q12H Persons, Bevely Palmer, [place]   3 mL at 11/11/19 2157     Discharge Medications: Please see discharge summary for a list of discharge medications.  Relevant Imaging Results:  Relevant Lab Results:   Additional [place], LCSW

## 2019-11-12 NOTE — Procedures (Signed)
I was present at this dialysis session. I have reviewed the session itself and made appropriate changes.   3K bath.  UF goal 2L.  Pt w/o complaints. For AVF/G tomorrow with VVS.   Filed Weights   11/10/19 0653 11/10/19 1030 11/12/19 0718  Weight: (!) 131 kg (!) 129.7 kg (!) 129.5 kg    Recent Labs  Lab 11/09/19 0706 11/10/19 0434 11/12/19 0745  NA  --    < > 138  K  --    < > 3.9  CL  --    < > 104  CO2  --    < > 25  GLUCOSE  --    < > 158*  BUN  --    < > 54*  CREATININE  --    < > 4.38*  CALCIUM  --    < > 8.2*  PHOS 3.4  --   --    < > = values in this interval not displayed.    Recent Labs  Lab 11/06/19 0230 11/06/19 0230 11/09/19 0212 11/10/19 0434 11/12/19 0745  WBC 7.1   < > 7.7 8.1 6.9  NEUTROABS 3.9  --  4.0  --   --   HGB 8.8*   < > 9.1* 8.9* 8.4*  HCT 28.4*   < > 29.7* 29.3* 28.3*  MCV 85.3   < > 85.8 86.2 86.0  PLT 356   < > 295 299 258   < > = values in this interval not displayed.    Scheduled Meds: . aspirin EC  81 mg Oral Daily  . Chlorhexidine Gluconate Cloth  6 each Topical Q0600  . collagenase   Topical Daily  . docusate sodium  100 mg Oral BID  . gabapentin  200 mg Oral BID  . insulin aspart  0-9 Units Subcutaneous TID WC  . insulin aspart  4 Units Subcutaneous TID WC  . insulin glargine  15 Units Subcutaneous QHS  . multivitamin with minerals  1 tablet Oral Daily  . nutrition supplement (JUVEN)  1 packet Oral BID BM  . prochlorperazine  10 mg Intravenous QHS  . Ensure Max Protein  11 oz Oral BID  . sodium chloride flush  3 mL Intravenous Q12H   Continuous Infusions: . [START ON 11/13/2019]  ceFAZolin (ANCEF) IV    . ferric gluconate (FERRLECIT/NULECIT) IV 125 mg (11/11/19 1258)   PRN Meds:.albuterol, HYDROmorphone (DILAUDID) injection, hydrOXYzine, ondansetron **OR** ondansetron (ZOFRAN) IV, oxyCODONE-acetaminophen, prochlorperazine   Zachary Grippe  MD 11/12/2019, 9:51 AM

## 2019-11-12 NOTE — TOC Progression Note (Addendum)
Transition of Care [facility]) - Progression Note    Patient Details  Name: Bon Dowis MRN: [id number] Date of Birth: 06-28-1960  Transition of Care [facility]) CM/SW [place], [place] Phone Number: 11/12/2019, 12:52 PM  Clinical Narrative:    CSW consulted with [facility] leadership Barbette Or, guidance given to continue to try and find SNF placement. Offers from [facility] facilities still on hub, CSW has contacted [name], admissions liaison to assess if still on the table. RNCM will f/u with pt sisters to to see if they have made contact with probation officer per [facility] legal guidance, as no answer/call back to this Probation officer. Await response from [name]. Placement needed in order to clip pt for outpatient dialysis.   Expected Discharge Plan: Skilled Nursing Facility Barriers to Discharge: Continued Medical Work up, No SNF bed, Homeless with medical needs, Waiting for outpatient dialysis  Expected Discharge Plan and Services Expected Discharge Plan: [place] In-house Referral: Clinical Social Work Discharge Planning Services: CM Consult Post Acute Care Choice: [place], Dialysis Living arrangements for the past 2 months: No permanent address  Readmission Risk Interventions Readmission Risk Prevention Plan 11/12/2019  Transportation Screening Complete  Medication Review Press photographer) Referral to Pharmacy  PCP or Specialist appointment within 3-5 days of discharge Not Complete  PCP/Specialist Appt Not Complete comments plan for SNF; no PCP  HRI or Home Care Consult Complete  SW Recovery Care/Counseling Consult Complete  Palliative Care Screening Complete  Skilled Nursing Facility Complete  Some recent data might be hidden

## 2019-11-12 NOTE — Progress Notes (Addendum)
Based on vein mapping, patient may be a candidate for L basilic vein fistula LUE restricted Plan is for L arm AVF vs graft tomorrow 11/13/19 Pre-op orders written Keep NPO past midnight Consent  Dagoberto Ligas, PA-C [facility] [phone] 11/12/2019  7:34 AM    VASCULAR SURGERY ASSESSMENT & PLAN:   END-STAGE RENAL DISEASE: The patient is scheduled for placement of the basilic vein transposition versus AV graft in the left arm tomorrow by Dr. Donnetta Hutching.  I have explained to the patient that if he has a basilic vein transposition this is usually done in 2 stages.  We have reviewed the indications for the procedure and the potential complications and he is agreeable to proceed.  He is currently on dialysis with his tunneled dialysis catheter which is working well.   SUBJECTIVE:   No complaints this morning.  PHYSICAL EXAM:   Vitals:   11/11/19 1224 11/11/19 1719 11/12/19 0039 11/12/19 0557  BP: (!) 144/74 (!) 143/69 128/66 (!) 138/69  Pulse: 73 75 74 67  Resp: 17 16 16 17   Temp: 98 F (36.7 C) 98 F (36.7 C) 97.7 F (36.5 C) 98.2 F (36.8 C)  TempSrc: Oral Oral Oral Oral  SpO2: 98% 99% 99% 99%  Weight:      Height:       Palpable left radial pulse. No IVs in the left arm.  LABS:   Lab Results  Component Value Date   WBC 8.1 11/10/2019   HGB 8.9 (L) 11/10/2019   HCT 29.3 (L) 11/10/2019   MCV 86.2 11/10/2019   PLT 299 11/10/2019   Lab Results  Component Value Date   CREATININE 3.84 (H) 11/11/2019   Lab Results  Component Value Date   INR 1.0 11/05/2019   CBG (last 3)  Recent Labs    11/11/19 1602 11/11/19 2133 11/12/19 0554  GLUCAP 130* 131* 99    PROBLEM LIST:    Active Problems:   Hypertensive heart disease with CHF (congestive heart failure) (HCC)   Chronic kidney disease, stage 4 (severe) (HCC)   HLD (hyperlipidemia)   HTN (hypertension)   Systolic and diastolic CHF, chronic (HCC)   Tobacco use disorder   Type 2 diabetes  mellitus with circulatory disorder, with long-term current use of insulin (HCC)   Wound of right foot   Syncope and collapse   Cocaine abuse ([place])   Subacute osteomyelitis, right ankle and foot (HCC)   Abscess of right foot   Diabetic neuropathy (HCC)   Anemia of chronic disease   Severe obesity (BMI 35.0-39.9) with comorbidity ([place])   Metabolic acidosis   DM (diabetes mellitus), secondary, uncontrolled, with complications (HCC)   Elevated sedimentation rate   Elevated C-reactive protein (CRP)   Hypoalbuminemia   Goals of care, counseling/discussion   Palliative care by specialist   CURRENT MEDS:   . aspirin EC  81 mg Oral Daily  . Chlorhexidine Gluconate Cloth  6 each Topical Q0600  . collagenase   Topical Daily  . docusate sodium  100 mg Oral BID  . gabapentin  200 mg Oral BID  . insulin aspart  0-9 Units Subcutaneous TID WC  . insulin aspart  4 Units Subcutaneous TID WC  . insulin glargine  15 Units Subcutaneous QHS  . multivitamin with minerals  1 tablet Oral Daily  . nutrition supplement (JUVEN)  1 packet Oral BID BM  . prochlorperazine  10 mg Intravenous QHS  . Ensure Max Protein  11 oz Oral BID  .  sodium chloride flush  3 mL Intravenous Q12H    Deitra Mayo Office: [phone] 11/12/2019

## 2019-11-12 NOTE — Progress Notes (Signed)
PROGRESS NOTE  Zachary Young NAT:[id number] DOB: [birth year] DOA: 10/17/2019 PCP: System, Pcp Not In  HPI/Recap of past 24 hours:  [name] PMHx Post right below the knee amputation on 10/21/2019 by orthopedic surgery Dr. Sharol Given. Hospital course complicated by worsening renal function in the setting of progressive CKD V for which nephrology was consulted.  Has very poor insight into his medical condition.  His 2 sisters Zachary Young and Zachary Young are his medical decision makers.  Per his sister Zachary Young via phone he is presently homeless.  He has difficulty understanding the severity of his medical condition.  Her along with her sister Zachary Young are making medical decisions for him.    Patient is on parole and his sister Zachary Young is working to lift it up in order to facilitate his placement to SNF.    Post right IJ TDC placement by IR on 11/05/2019.  1st HD completed on 11/06/2019.  2nd HD planned on 11/07/2019.  3rd HD completed on 11/10/2019.  Difficult placement, TOC assisting with SNF placement.     Assessment/Plan: Active Problems:   Hypertensive heart disease with CHF (congestive heart failure) (HCC)   Chronic kidney disease, stage 4 (severe) (HCC)   HLD (hyperlipidemia)   HTN (hypertension)   Systolic and diastolic CHF, chronic (HCC)   Tobacco use disorder   Type 2 diabetes mellitus with circulatory disorder, with long-term current use of insulin (HCC)   Wound of right foot   Syncope and collapse   Cocaine abuse ([place])   Subacute osteomyelitis, right ankle and foot (HCC)   Abscess of right foot   Diabetic neuropathy (HCC)   Anemia of chronic disease   Severe obesity (BMI 35.0-39.9) with comorbidity ([place])   Metabolic acidosis   DM (diabetes mellitus), secondary, uncontrolled, with complications (HCC)   Elevated sedimentation rate   Elevated C-reactive protein (CRP)   Hypoalbuminemia   Goals of care, counseling/discussion   Palliative care by specialist   Right foot wound status post BKA on  10/21/19 by Dr. Clarita Leber on the residual limb and had some bleeding on 7/21. Seen by ortho on 7/22, no wound dehiscence Ortho recommends to hold SQ lovenox until the hematoma has resolved.  Will follow up with Ortho as an outpatient. PT has recommended SNF, difficult placement due to complicated social issues, on parole.  Progressive CKD V, now ESRD  Presented with nonoliguric acute renal failure on chronic kidney disease stage IV, hyperkalemia, with evidence of early uremic symptoms.  Post right IJ TDC placement by IR Dr. Earleen Newport, on 11/05/2019 1st session of hemodialysis on 11/06/2019 2nd session of hemodialysis on 11/07/2019 3rd session 11/10/19 Will need HD spot prior to DC, TOC working on SNF placement Highly appreciate nephrology's assistance  Transient intractable nausea and vomiting Suspect secondary to uremia in the setting of progressive CKD V  Continue hemodialysis  Diabetes mellitus type 2, uncontrolled with hyperglycemia and renal complications [id number] 8.8.  Reduced doses of insulin to avoid hypoglycemia in the setting of new ESRD Lantus reduced to 10 U nightly NovoLog reduced to 4 units 3 times daily Continue with insulin sliding scale, sensitive.  Avoid hypoglycemia  Syncope, no recurrence Likely multifactorial including overdiuresis and acute kidney injury and acute infection.  No further episodes in the hospital.  Continue to monitor  Chronic diastolic CHF EF 50 to 23% based on echocardiogram done in May.  Volume status addressed with hemodialysis Continue to monitor daily weight  Hypotension, currently blood pressures are soft Hypotensive at dialysis Hold off  Norvasc  Maintain MAP greater than 65 Continue to monitor vital signs  Anemia of chronic kidney disease/acute blood loss-bled from his stump, resolved. Hemoglobin is 8.8 from 9.8 On 10 CBC in the morning  Acute blood loss anemia from bleeding stump 7/21, and 7/22. Seen by orthopedic surgery,  Dr. Sharol Given, on 7/22, recommended to hold off Lovenox until hematoma has resolved Hg as reported above Continue to monitor H&H  History of cocaine abuse His urine drug screen from 7/4 was positive for cocaine Cessation counseling done at bedside  Obesity .Body mass index is 35.68 kg/m.  Behavioral issues Patient has exhibited belligerent behavior with staff. He becomes verbally abusive. Appears to have very poor understanding of his multiple medical problems.  His sisters Zachary Young and Zachary Young are his medical decision makers.  Goals of care Seen by palliative care previously.  Palliative care team reconsulted to assist with establishing goals of care, appreciate assistance. His sisters Zachary Young and Zachary Young are his medical decision makers. DNR as of 11/03/2019   DVT Prophylaxis:SCDs Code Status:DNR Disposition Plan:Status is: Inpatient    Status is: Inpatient  Remains inpatient appropriate because:Inpatient level of care appropriate due to severity of illness   Dispo:             Patient From: Home             Planned Disposition: To be determined              Expected discharge date: unclear due to placement issues             Medically not stable for discharge due to now ESRD on hemodialysis requiring a stable disposition and an HD spot prior to discharge.       Objective: Vitals:   11/12/19 1000 11/12/19 1001 11/12/19 1030 11/12/19 1257  BP: (!) 82/46 (!) 98/56 103/67 (!) 102/53  Pulse: 87 82 80 79  Resp:    18  Temp:    98.9 F (37.2 C)  TempSrc:    Oral  SpO2:    99%  Weight:      Height:       No intake or output data in the 24 hours ending 11/12/19 1352 Filed Weights   11/10/19 0653 11/10/19 1030 11/12/19 0718  Weight: (!) 131 kg (!) 129.7 kg (!) 129.5 kg    In HD, NAD   Data Reviewed: CBC: Recent Labs  Lab 11/06/19 0230 11/09/19 0212 11/10/19 0434 11/12/19 0745  WBC 7.1 7.7 8.1 6.9  NEUTROABS 3.9 4.0  --   --   HGB 8.8* 9.1* 8.9* 8.4*    HCT 28.4* 29.7* 29.3* 28.3*  MCV 85.3 85.8 86.2 86.0  PLT 356 295 299 614   Basic Metabolic Panel: Recent Labs  Lab 11/06/19 0551 11/06/19 1238 11/06/19 1645 11/07/19 0218 11/08/19 0442 11/09/19 0212 11/09/19 0706 11/10/19 0434 11/11/19 0702 11/12/19 0745  NA 131*   < > 136   < > 136 138  --  140 139 138  K 4.1   < > 4.4   < > 4.4 4.4  --  4.1 3.9 3.9  CL 97*   < > 105   < > 104 105  --  107 105 104  CO2 27   < > 19*   < > 22 24  --  24 24 25   GLUCOSE 332*   < > 182*   < > 175* 328*  --  154* 122* 158*  BUN 16   < >  88*   < > 63* 72*  --  70* 46* 54*  CREATININE 0.96   < > 5.27*   < > 4.19* 5.14*  --  4.81* 3.84* 4.38*  CALCIUM 8.7*   < > 9.2   < > 8.9 8.7*  --  8.3* 8.2* 8.2*  PHOS 3.0  --  5.2*  --   --   --  3.4  --   --   --    < > = values in this interval not displayed.   GFR: Estimated Creatinine Clearance: 27 mL/min (A) (by C-G formula based on SCr of 4.38 mg/dL (H)). Liver Function Tests: Recent Labs  Lab 11/06/19 0551 11/06/19 1645  ALBUMIN 2.6* 2.5*   No results for input(s): LIPASE, AMYLASE in the last 168 hours. No results for input(s): AMMONIA in the last 168 hours. Coagulation Profile: No results for input(s): INR, PROTIME in the last 168 hours. Cardiac Enzymes: No results for input(s): CKTOTAL, CKMB, CKMBINDEX, TROPONINI in the last 168 hours. BNP (last 3 results) No results for input(s): PROBNP in the last 8760 hours. HbA1C: No results for input(s): HGBA1C in the last 72 hours. CBG: Recent Labs  Lab 11/11/19 1055 11/11/19 1602 11/11/19 2133 11/12/19 0554 11/12/19 1142  GLUCAP 142* 130* 131* 99 118*   Lipid Profile: No results for input(s): CHOL, HDL, LDLCALC, TRIG, CHOLHDL, LDLDIRECT in the last 72 hours. Thyroid Function Tests: No results for input(s): TSH, T4TOTAL, FREET4, T3FREE, THYROIDAB in the last 72 hours. Anemia Panel: No results for input(s): VITAMINB12, FOLATE, FERRITIN, TIBC, IRON, RETICCTPCT in the last 72 hours. Urine  analysis:    Component Value Date/Time   COLORURINE YELLOW 08/15/2019 1538   APPEARANCEUR CLEAR 08/15/2019 1538   LABSPEC 1.010 08/15/2019 1538   PHURINE 5.0 08/15/2019 1538   GLUCOSEU NEGATIVE 08/15/2019 1538   HGBUR SMALL (A) 08/15/2019 1538   BILIRUBINUR NEGATIVE 08/15/2019 1538   KETONESUR NEGATIVE 08/15/2019 1538   PROTEINUR >=300 (A) 08/15/2019 1538   NITRITE NEGATIVE 08/15/2019 1538   LEUKOCYTESUR NEGATIVE 08/15/2019 1538     )No results found for this or any previous visit (from the past 240 hour(s)).    Studies: No results found.  Scheduled Meds: . aspirin EC  81 mg Oral Daily  . Chlorhexidine Gluconate Cloth  6 each Topical Q0600  . collagenase   Topical Daily  . docusate sodium  100 mg Oral BID  . gabapentin  200 mg Oral BID  . insulin aspart  0-9 Units Subcutaneous TID WC  . insulin aspart  4 Units Subcutaneous TID WC  . insulin glargine  15 Units Subcutaneous QHS  . multivitamin with minerals  1 tablet Oral Daily  . nutrition supplement (JUVEN)  1 packet Oral BID BM  . prochlorperazine  10 mg Intravenous QHS  . Ensure Max Protein  11 oz Oral BID  . sodium chloride flush  3 mL Intravenous Q12H    Continuous Infusions: . [START ON 11/13/2019]  ceFAZolin (ANCEF) IV    . ferric gluconate (FERRLECIT/NULECIT) IV 125 mg (11/11/19 1258)     LOS: 26 days     Geradine Girt, DO [facility]  If 7PM-7AM, please contact night-coverage  11/12/2019, 1:52 PM

## 2019-11-12 NOTE — Care Management (Addendum)
NCM received message [facility] and [facility] in [place] can offer a bed pending dialysis CLIP.  NCM called patient's sister Raykwon Hobbs [phone] and left message.  [name] had requested [facility] Team to call patient's probation officer  [facility] [phone] , messages left with no return call. Will see if [name] can update.    Left [name] Renal Navigator a message.  Await call back from [place].  [name] RN

## 2019-11-12 NOTE — Progress Notes (Signed)
OT Cancellation Note  Patient Details Name: Zachary Young MRN: [id number] DOB: [birth year]   Cancelled Treatment:    Reason Eval/Treat Not Completed: Other (comment) Patient sleeping upon arrival, not arousing to name. Will check back as schedule allows.  Delbert Phenix OT OT office: [phone]   Rosemary Holms 11/12/2019, 2:06 PM

## 2019-11-12 NOTE — H&P (View-Only) (Signed)
Based on vein mapping, patient may be a candidate for L basilic vein fistula LUE restricted Plan is for L arm AVF vs graft tomorrow 11/13/19 Pre-op orders written Keep NPO past midnight Consent  Dagoberto Ligas, PA-C [facility] [phone] 11/12/2019  7:34 AM    VASCULAR SURGERY ASSESSMENT & PLAN:   END-STAGE RENAL DISEASE: The patient is scheduled for placement of the basilic vein transposition versus AV graft in the left arm tomorrow by Dr. Donnetta Hutching.  I have explained to the patient that if he has a basilic vein transposition this is usually done in 2 stages.  We have reviewed the indications for the procedure and the potential complications and he is agreeable to proceed.  He is currently on dialysis with his tunneled dialysis catheter which is working well.   SUBJECTIVE:   No complaints this morning.  PHYSICAL EXAM:   Vitals:   11/11/19 1224 11/11/19 1719 11/12/19 0039 11/12/19 0557  BP: (!) 144/74 (!) 143/69 128/66 (!) 138/69  Pulse: 73 75 74 67  Resp: 17 16 16 17   Temp: 98 F (36.7 C) 98 F (36.7 C) 97.7 F (36.5 C) 98.2 F (36.8 C)  TempSrc: Oral Oral Oral Oral  SpO2: 98% 99% 99% 99%  Weight:      Height:       Palpable left radial pulse. No IVs in the left arm.  LABS:   Lab Results  Component Value Date   WBC 8.1 11/10/2019   HGB 8.9 (L) 11/10/2019   HCT 29.3 (L) 11/10/2019   MCV 86.2 11/10/2019   PLT 299 11/10/2019   Lab Results  Component Value Date   CREATININE 3.84 (H) 11/11/2019   Lab Results  Component Value Date   INR 1.0 11/05/2019   CBG (last 3)  Recent Labs    11/11/19 1602 11/11/19 2133 11/12/19 0554  GLUCAP 130* 131* 99    PROBLEM LIST:    Active Problems:   Hypertensive heart disease with CHF (congestive heart failure) (HCC)   Chronic kidney disease, stage 4 (severe) (HCC)   HLD (hyperlipidemia)   HTN (hypertension)   Systolic and diastolic CHF, chronic (HCC)   Tobacco use disorder   Type 2 diabetes  mellitus with circulatory disorder, with long-term current use of insulin (HCC)   Wound of right foot   Syncope and collapse   Cocaine abuse ([place])   Subacute osteomyelitis, right ankle and foot (HCC)   Abscess of right foot   Diabetic neuropathy (HCC)   Anemia of chronic disease   Severe obesity (BMI 35.0-39.9) with comorbidity ([place])   Metabolic acidosis   DM (diabetes mellitus), secondary, uncontrolled, with complications (HCC)   Elevated sedimentation rate   Elevated C-reactive protein (CRP)   Hypoalbuminemia   Goals of care, counseling/discussion   Palliative care by specialist   CURRENT MEDS:   . aspirin EC  81 mg Oral Daily  . Chlorhexidine Gluconate Cloth  6 each Topical Q0600  . collagenase   Topical Daily  . docusate sodium  100 mg Oral BID  . gabapentin  200 mg Oral BID  . insulin aspart  0-9 Units Subcutaneous TID WC  . insulin aspart  4 Units Subcutaneous TID WC  . insulin glargine  15 Units Subcutaneous QHS  . multivitamin with minerals  1 tablet Oral Daily  . nutrition supplement (JUVEN)  1 packet Oral BID BM  . prochlorperazine  10 mg Intravenous QHS  . Ensure Max Protein  11 oz Oral BID  .  sodium chloride flush  3 mL Intravenous Q12H    Deitra Mayo Office: [phone] 11/12/2019

## 2019-11-13 ENCOUNTER — Encounter (HOSPITAL_COMMUNITY)
Admission: EM | Disposition: A | Discharge status: Home / Self Care | Payer: Self-pay | Source: Home / Self Care | Attending: Internal Medicine

## 2019-11-13 ENCOUNTER — Inpatient Hospital Stay (HOSPITAL_COMMUNITY): Payer: Medicaid Other | Admitting: Anesthesiology

## 2019-11-13 ENCOUNTER — Encounter (HOSPITAL_COMMUNITY): Payer: Self-pay | Admitting: Internal Medicine

## 2019-11-13 DIAGNOSIS — N186 End stage renal disease: Secondary | ICD-10-CM

## 2019-11-13 HISTORY — PX: AV FISTULA PLACEMENT: SHX1204

## 2019-11-13 LAB — GLUCOSE, CAPILLARY
Glucose-Capillary: 95 mg/dL (ref 70–99)
Glucose-Capillary: 111 mg/dL — ABNORMAL HIGH (ref 70–99)
Glucose-Capillary: 150 mg/dL — ABNORMAL HIGH (ref 70–99)
Glucose-Capillary: 337 mg/dL — ABNORMAL HIGH (ref 70–99)

## 2019-11-13 SURGERY — ARTERIOVENOUS (AV) FISTULA CREATION
Anesthesia: General | Site: Arm Lower | Laterality: Left

## 2019-11-13 MED ORDER — DEXAMETHASONE SODIUM PHOSPHATE 10 MG/ML IJ SOLN
INTRAMUSCULAR | Status: AC
  Filled 2019-11-13: qty 1

## 2019-11-13 MED ORDER — ETOMIDATE 2 MG/ML IV SOLN
INTRAVENOUS | Status: AC
  Filled 2019-11-13: qty 10

## 2019-11-13 MED ORDER — CHLORHEXIDINE GLUCONATE 0.12 % MT SOLN
OROMUCOSAL | Status: AC
  Administered 2019-11-13: 15 mL via OROMUCOSAL
  Filled 2019-11-13: qty 15

## 2019-11-13 MED ORDER — SODIUM CHLORIDE 0.9 % IV SOLN: INTRAVENOUS | Status: DC

## 2019-11-13 MED ORDER — PROPOFOL 10 MG/ML IV BOLUS
INTRAVENOUS | Status: AC
  Filled 2019-11-13: qty 40

## 2019-11-13 MED ORDER — ACETAMINOPHEN 500 MG PO TABS
ORAL_TABLET | ORAL | Status: AC
  Filled 2019-11-13: qty 2

## 2019-11-13 MED ORDER — ALBUMIN HUMAN 5 % IV SOLN
INTRAVENOUS | Status: DC | PRN
Start: 2019-11-13 — End: 2019-11-13

## 2019-11-13 MED ORDER — ACETAMINOPHEN 500 MG PO TABS
1000.0000 mg | ORAL_TABLET | Freq: Once | ORAL | Status: AC
  Administered 2019-11-13: 1000 mg via ORAL

## 2019-11-13 MED ORDER — CHLORHEXIDINE GLUCONATE 0.12 % MT SOLN
15.0000 mL | Freq: Once | OROMUCOSAL | Status: AC
  Filled 2019-11-13: qty 15

## 2019-11-13 MED ORDER — FENTANYL CITRATE (PF) 100 MCG/2ML IJ SOLN: 25.0000 ug | INTRAMUSCULAR | Status: DC | PRN

## 2019-11-13 MED ORDER — DEXAMETHASONE SODIUM PHOSPHATE 4 MG/ML IJ SOLN
INTRAMUSCULAR | Status: DC | PRN
  Administered 2019-11-13: 4 mg via INTRAVENOUS

## 2019-11-13 MED ORDER — ONDANSETRON HCL 4 MG/2ML IJ SOLN
INTRAMUSCULAR | Status: AC
  Filled 2019-11-13: qty 2

## 2019-11-13 MED ORDER — PROPOFOL 10 MG/ML IV BOLUS
INTRAVENOUS | Status: DC | PRN
  Administered 2019-11-13: 20 mg via INTRAVENOUS
  Administered 2019-11-13: 130 mg via INTRAVENOUS

## 2019-11-13 MED ORDER — LIDOCAINE-EPINEPHRINE 0.5 %-1:200000 IJ SOLN: INTRAMUSCULAR | Status: DC | PRN

## 2019-11-13 MED ORDER — MIDAZOLAM HCL 5 MG/5ML IJ SOLN
INTRAMUSCULAR | Status: DC | PRN
  Administered 2019-11-13: 2 mg via INTRAVENOUS

## 2019-11-13 MED ORDER — LIDOCAINE-EPINEPHRINE 0.5 %-1:200000 IJ SOLN
INTRAMUSCULAR | Status: AC
  Filled 2019-11-13: qty 1

## 2019-11-13 MED ORDER — ONDANSETRON HCL 4 MG/2ML IJ SOLN
INTRAMUSCULAR | Status: DC | PRN
  Administered 2019-11-13: 4 mg via INTRAVENOUS

## 2019-11-13 MED ORDER — FENTANYL CITRATE (PF) 100 MCG/2ML IJ SOLN
INTRAMUSCULAR | Status: DC | PRN
  Administered 2019-11-13 (×2): 50 ug via INTRAVENOUS

## 2019-11-13 MED ORDER — CELECOXIB 200 MG PO CAPS
ORAL_CAPSULE | ORAL | Status: AC
  Filled 2019-11-13: qty 1

## 2019-11-13 MED ORDER — 0.9 % SODIUM CHLORIDE (POUR BTL) OPTIME
TOPICAL | Status: DC | PRN
  Administered 2019-11-13: 1000 mL

## 2019-11-13 MED ORDER — PHENYLEPHRINE 40 MCG/ML (10ML) SYRINGE FOR IV PUSH (FOR BLOOD PRESSURE SUPPORT)
PREFILLED_SYRINGE | INTRAVENOUS | Status: AC
  Filled 2019-11-13: qty 20

## 2019-11-13 MED ORDER — FENTANYL CITRATE (PF) 250 MCG/5ML IJ SOLN
INTRAMUSCULAR | Status: AC
  Filled 2019-11-13: qty 5

## 2019-11-13 MED ORDER — PROMETHAZINE HCL 25 MG/ML IJ SOLN: 6.2500 mg | INTRAMUSCULAR | Status: DC | PRN

## 2019-11-13 MED ORDER — PHENYLEPHRINE HCL-NACL 10-0.9 MG/250ML-% IV SOLN
INTRAVENOUS | Status: DC | PRN
  Administered 2019-11-13: 25 ug/min via INTRAVENOUS

## 2019-11-13 MED ORDER — SODIUM CHLORIDE 0.9 % IV SOLN
INTRAVENOUS | Status: DC | PRN
  Administered 2019-11-13: 10:00:00 500 mL

## 2019-11-13 MED ORDER — LIDOCAINE 2% (20 MG/ML) 5 ML SYRINGE
INTRAMUSCULAR | Status: AC
  Filled 2019-11-13: qty 5

## 2019-11-13 MED ORDER — LIDOCAINE HCL (CARDIAC) PF 100 MG/5ML IV SOSY
PREFILLED_SYRINGE | INTRAVENOUS | Status: DC | PRN
  Administered 2019-11-13: 30 mg via INTRAVENOUS

## 2019-11-13 MED ORDER — PHENYLEPHRINE 40 MCG/ML (10ML) SYRINGE FOR IV PUSH (FOR BLOOD PRESSURE SUPPORT)
PREFILLED_SYRINGE | INTRAVENOUS | Status: DC | PRN
  Administered 2019-11-13: 120 ug via INTRAVENOUS
  Administered 2019-11-13: 80 ug via INTRAVENOUS
  Administered 2019-11-13 (×3): 120 ug via INTRAVENOUS
  Administered 2019-11-13: 40 ug via INTRAVENOUS
  Administered 2019-11-13: 80 ug via INTRAVENOUS
  Administered 2019-11-13: 40 ug via INTRAVENOUS
  Administered 2019-11-13: 80 ug via INTRAVENOUS

## 2019-11-13 MED ORDER — SODIUM CHLORIDE 0.9 % IV SOLN
INTRAVENOUS | Status: AC
  Filled 2019-11-13: qty 1.2

## 2019-11-13 MED ORDER — CELECOXIB 200 MG PO CAPS
200.0000 mg | ORAL_CAPSULE | Freq: Once | ORAL | Status: AC
  Administered 2019-11-13: 200 mg via ORAL

## 2019-11-13 MED ORDER — MIDAZOLAM HCL 2 MG/2ML IJ SOLN
INTRAMUSCULAR | Status: AC
  Filled 2019-11-13: qty 2

## 2019-11-13 SURGICAL SUPPLY — 35 items
ARMBAND PINK RESTRICT EXTREMIT (MISCELLANEOUS) ×6 IMPLANT
CANISTER SUCT 3000ML PPV (MISCELLANEOUS) ×3 IMPLANT
CANNULA VESSEL 3MM 2 BLNT TIP (CANNULA) ×3 IMPLANT
CLIP LIGATING EXTRA MED SLVR (CLIP) ×3 IMPLANT
CLIP LIGATING EXTRA SM BLUE (MISCELLANEOUS) ×3 IMPLANT
COVER PROBE W GEL 5X96 (DRAPES) ×1 IMPLANT
COVER WAND RF STERILE (DRAPES) ×1 IMPLANT
DECANTER SPIKE VIAL GLASS SM (MISCELLANEOUS) ×3 IMPLANT
DERMABOND ADVANCED (GAUZE/BANDAGES/DRESSINGS) ×2
DERMABOND ADVANCED .7 DNX12 (GAUZE/BANDAGES/DRESSINGS) ×1 IMPLANT
ELECT REM PT RETURN 9FT ADLT (ELECTROSURGICAL) ×3
ELECTRODE REM PT RTRN 9FT ADLT (ELECTROSURGICAL) ×1 IMPLANT
GLOVE BIO SURGEON STRL SZ7.5 (GLOVE) ×4 IMPLANT
GLOVE BIOGEL M 6.5 STRL (GLOVE) ×2 IMPLANT
GLOVE BIOGEL PI IND STRL 6.5 (GLOVE) IMPLANT
GLOVE BIOGEL PI IND STRL 7.5 (GLOVE) IMPLANT
GLOVE BIOGEL PI INDICATOR 6.5 (GLOVE) ×2
GLOVE BIOGEL PI INDICATOR 7.5 (GLOVE) ×4
GLOVE ECLIPSE 6.5 STRL STRAW (GLOVE) ×4 IMPLANT
GLOVE ECLIPSE 7.5 STRL STRAW (GLOVE) ×2 IMPLANT
GLOVE SS BIOGEL STRL SZ 7.5 (GLOVE) ×1 IMPLANT
GLOVE SUPERSENSE BIOGEL SZ 7.5 (GLOVE) ×2
GOWN STRL REUS W/ TWL LRG LVL3 (GOWN DISPOSABLE) ×3 IMPLANT
GOWN STRL REUS W/TWL LRG LVL3 (GOWN DISPOSABLE) ×6
KIT BASIN OR (CUSTOM PROCEDURE TRAY) ×3 IMPLANT
KIT TURNOVER KIT B (KITS) ×3 IMPLANT
NS IRRIG 1000ML POUR BTL (IV SOLUTION) ×3 IMPLANT
PACK CV ACCESS (CUSTOM PROCEDURE TRAY) ×3 IMPLANT
PAD ARMBOARD 7.5X6 YLW CONV (MISCELLANEOUS) ×6 IMPLANT
SUT PROLENE 6 0 CC (SUTURE) ×7 IMPLANT
SUT VIC AB 3-0 SH 27 (SUTURE) ×4
SUT VIC AB 3-0 SH 27X BRD (SUTURE) ×1 IMPLANT
TOWEL GREEN STERILE (TOWEL DISPOSABLE) ×3 IMPLANT
UNDERPAD 30X36 HEAVY ABSORB (UNDERPADS AND DIAPERS) ×3 IMPLANT
WATER STERILE IRR 1000ML POUR (IV SOLUTION) ×3 IMPLANT

## 2019-11-13 NOTE — Discharge Instructions (Signed)
° °  [facility] [place] ° °Discharge Instructions ° °AV Fistula or Graft Surgery for Dialysis Access ° °Please refer to the following instructions for your post-procedure care. Your surgeon or physician assistant will discuss any changes with you. ° °Activity ° °You may drive the day following your surgery, if you are comfortable and no longer taking prescription pain medication. Resume full activity as the soreness in your incision resolves. ° °Bathing/Showering ° °You may shower after you go home. Keep your incision dry for 48 hours. Do not soak in a bathtub, hot tub, or swim until the incision heals completely. You may not shower if you have a hemodialysis catheter. ° °Incision Care ° °Clean your incision with mild soap and water after 48 hours. Pat the area dry with a clean towel. You do not need a bandage unless otherwise instructed. Do not apply any ointments or creams to your incision. You may have skin glue on your incision. Do not peel it off. It will come off on its own in about one week. Your arm may swell a bit after surgery. To reduce swelling use pillows to elevate your arm so it is above your heart. Your doctor will tell you if you need to lightly wrap your arm with an ACE bandage. ° °Diet ° °Resume your normal diet. There are not special food restrictions following this procedure. In order to heal from your surgery, it is CRITICAL to get adequate nutrition. Your body requires vitamins, minerals, and protein. Vegetables are the best source of vitamins and minerals. Vegetables also provide the perfect balance of protein. Processed food has little nutritional value, so try to avoid this. ° °Medications ° °Resume taking all of your medications. If your incision is causing pain, you may take over-the counter pain relievers such as acetaminophen (Tylenol). If you were prescribed a stronger pain medication, please be aware these medications can cause nausea and constipation. Prevent  nausea by taking the medication with a snack or meal. Avoid constipation by drinking plenty of fluids and eating foods with high amount of fiber, such as fruits, vegetables, and grains. Do not take Tylenol if you are taking prescription pain medications. ° ° ° ° °Follow up °Your surgeon may want to see you in the office following your access surgery. If so, this will be arranged at the time of your surgery. ° °Please call us immediately for any of the following conditions: ° °Increased pain, redness, drainage (pus) from your incision site °Fever of 101 degrees or higher °Severe or worsening pain at your incision site °Hand pain or numbness. ° °Reduce your risk of vascular disease: ° °Stop smoking. If you would like help, call [facility] at 1-800-QUIT-NOW (1-800-784-8669) or [place] at 336-586-4000 ° °Manage your cholesterol °Maintain a desired weight °Control your diabetes °Keep your blood pressure down ° °Dialysis ° °It will take several weeks to several months for your new dialysis access to be ready for use. Your surgeon will determine when it is OK to use it. Your nephrologist will continue to direct your dialysis. You can continue to use your Permcath until your new access is ready for use. ° °If you have any questions, please call the office at [phone]. ° °

## 2019-11-13 NOTE — Transfer of Care (Signed)
Immediate Anesthesia Transfer of Care Note  Patient: Zachary Young  Procedure(s) Performed: LEFT ARTERIOVENOUS (AV) FISTULA VERSUS ARTERIOVENOUS GRAFT (Left Arm Lower)  Patient Location: PACU  Anesthesia Type:General  Level of Consciousness: awake, oriented and patient cooperative  Airway & Oxygen Therapy: Patient Spontanous Breathing and Patient connected to nasal cannula oxygen  Post-op Assessment: Report given to RN and Post -op Vital signs reviewed and stable  Post vital signs: Reviewed  Last Vitals:  Vitals Value Taken Time  BP 137/73 11/13/19 1206  Temp    Pulse 66 11/13/19 1208  Resp 10 11/13/19 1208  SpO2 100 % 11/13/19 1208  Vitals shown include unvalidated device data.  Last Pain:  Vitals:   11/13/19 0800  TempSrc:   PainSc: 0-No pain      Patients Stated Pain Goal: 0 (24/26/83 4196)  Complications: No complications documented.

## 2019-11-13 NOTE — Interval H&P Note (Signed)
History and Physical Interval Note:  11/13/2019 7:34 AM  Zachary Young  has presented today for surgery, with the diagnosis of CHRONIC KIDNEY DISEASE STAGE 4.  The various methods of treatment have been discussed with the patient and family. After consideration of risks, benefits and other options for treatment, the patient has consented to  Procedure(s): LEFT ARTERIOVENOUS (AV) FISTULA VERSUS ARTERIOVENOUS GRAFT (Left) as a surgical intervention.  The patient's history has been reviewed, patient examined, no change in status, stable for surgery.  I have reviewed the patient's chart and labs.  Questions were answered to the patient's satisfaction.     Curt Jews

## 2019-11-13 NOTE — Progress Notes (Signed)
Admit: 10/17/2019 LOS: 27  64M new ESRD, several social barriers  Subjective:   NAEO  Left brachiobasilic AVF earlier today with VVS  07/29 0701 - 07/30 0700 In: -  Out: 3000 [Urine:800]  Filed Weights   11/12/19 0718 11/12/19 1057 11/13/19 0508  Weight: (!) 129.5 kg (!) 126.4 kg (!) 126.4 kg    Scheduled Meds: . acetaminophen      . aspirin EC  81 mg Oral Daily  . celecoxib      . Chlorhexidine Gluconate Cloth  6 each Topical Q0600  . collagenase   Topical Daily  . docusate sodium  100 mg Oral BID  . gabapentin  200 mg Oral BID  . insulin aspart  0-9 Units Subcutaneous TID WC  . insulin aspart  4 Units Subcutaneous TID WC  . insulin glargine  15 Units Subcutaneous QHS  . multivitamin with minerals  1 tablet Oral Daily  . nutrition supplement (JUVEN)  1 packet Oral BID BM  . prochlorperazine  10 mg Intravenous QHS  . Ensure Max Protein  11 oz Oral BID  . sodium chloride flush  3 mL Intravenous Q12H   Continuous Infusions: . sodium chloride 10 mL/hr at 11/13/19 0949  . ferric gluconate (FERRLECIT/NULECIT) IV 125 mg (11/11/19 1258)   PRN Meds:.albuterol, HYDROmorphone (DILAUDID) injection, hydrOXYzine, ondansetron **OR** ondansetron (ZOFRAN) IV, oxyCODONE-acetaminophen, prochlorperazine  Current Labs: reviewed    Physical Exam:  Blood pressure (!) 144/69, pulse 65, temperature 98.7 F (37.1 C), temperature source Oral, resp. rate 15, height 6\' 3"  (1.905 m), weight (!) 126.4 kg, SpO2 96 %. NAD R IJ TDC C/D/I RRR CTAB RLE amputation, LLE 2+edema LUE BBF +B/T  A 1. New ESRD started 7/23 via [place] 1. CLIP in process, dep't upon SNF, could be difficult 2. Moving to THS schedule 3. R IJ TDC, s/p L BBF AVF 11/13/19 2. Metabolic Acidosis, resolved 3. Hyperkalemia, resolved 4. Homeless 5. Cocaine use 6. DM2 7. Anemia, on IV Fe, ESA, Hb stable 8. CKD-BMD: Ca and P ok 9. HTN, BPs ok, hypervolemic  P . HD tomorrow on THS schedule: 4h, 3-4L UF, SBP > 105, No heparin,  3K, 400/800 . CLIP / SNF in process . Medication Issues; o Preferred narcotic agents for pain control are hydromorphone, fentanyl, and methadone. Morphine should not be used.  o Baclofen should be avoided o Avoid oral sodium phosphate and magnesium citrate based laxatives / bowel preps    Pearson Grippe MD 11/13/2019, 1:57 PM  Recent Labs  Lab 11/06/19 1645 11/07/19 0218 11/09/19 1610 11/09/19 0706 11/10/19 0434 11/11/19 0702 11/12/19 0745  NA 136   < >   < >  --  140 139 138  K 4.4   < >   < >  --  4.1 3.9 3.9  CL 105   < >   < >  --  107 105 104  CO2 19*   < >   < >  --  24 24 25   GLUCOSE 182*   < >   < >  --  154* 122* 158*  BUN 88*   < >   < >  --  70* 46* 54*  CREATININE 5.27*   < >   < >  --  4.81* 3.84* 4.38*  CALCIUM 9.2   < >   < >  --  8.3* 8.2* 8.2*  PHOS 5.2*  --   --  3.4  --   --   --    < > =  values in this interval not displayed.   Recent Labs  Lab 11/09/19 0212 11/10/19 0434 11/12/19 0745  WBC 7.7 8.1 6.9  NEUTROABS 4.0  --   --   HGB 9.1* 8.9* 8.4*  HCT 29.7* 29.3* 28.3*  MCV 85.8 86.2 86.0  PLT 295 299 258

## 2019-11-13 NOTE — Progress Notes (Signed)
PT Cancellation Note  Patient Details Name: Zachary Young MRN: [id number] DOB: 08-05-1960   Cancelled Treatment:    Reason Eval/Treat Not Completed: Patient at procedure or test/unavailable (to OR). PT will continue to f/u with pt acutely as available and appropriate.    [place] 11/13/2019, 9:53 AM

## 2019-11-13 NOTE — Progress Notes (Signed)
Patient refusing all lab draws today. MD Vann aware.

## 2019-11-13 NOTE — Anesthesia Postprocedure Evaluation (Signed)
Anesthesia Post Note  Patient: Zachary Young  Procedure(s) Performed: LEFT ARTERIOVENOUS (AV) FISTULA VERSUS ARTERIOVENOUS GRAFT (Left Arm Lower)     Patient location during evaluation: PACU Anesthesia Type: General Level of consciousness: sedated Pain management: pain level controlled Vital Signs Assessment: post-procedure vital signs reviewed and stable Respiratory status: spontaneous breathing and respiratory function stable Cardiovascular status: stable Postop Assessment: no apparent nausea or vomiting Anesthetic complications: no   No complications documented.  Last Vitals:  Vitals:   11/13/19 1230 11/13/19 1320  BP: 123/65 (!) 144/69  Pulse: 69 65  Resp: (!) 10 15  Temp: 36.7 C 37.1 C  SpO2: 96% 96%    Last Pain:  Vitals:   11/13/19 1320  TempSrc: Oral  PainSc:                  Areeba Sulser DANIEL

## 2019-11-13 NOTE — Progress Notes (Signed)
PROGRESS NOTE  Zachary Young ZOX:[id number] DOB: [birth year] DOA: 10/17/2019 PCP: System, Pcp Not In  HPI/Recap of past 24 hours:  [name] PMHx Post right below the knee amputation on 10/21/2019 by orthopedic surgery Dr. Sharol Given. Hospital course complicated by worsening renal function in the setting of progressive CKD V for which nephrology was consulted.  Has very poor insight into his medical condition.  His 2 sisters Zachary Young and Zachary Young are his medical decision makers.  Per his sister Zachary Young via phone he is presently homeless.  He has difficulty understanding the severity of his medical condition.  Her along with her sister Zachary Young are making medical decisions for him.    Patient is on parole and his sister Zachary Young is working to lift it up in order to facilitate his placement to SNF.    Post right IJ TDC placement by IR on 11/05/2019.  1st HD completed on 11/06/2019.  2nd HD planned on 11/07/2019.  3rd HD completed on 11/10/2019.  Difficult placement, TOC assisting with SNF placement.     Assessment/Plan: Active Problems:   Hypertensive heart disease with CHF (congestive heart failure) (HCC)   Chronic kidney disease, stage 4 (severe) (HCC)   HLD (hyperlipidemia)   HTN (hypertension)   Systolic and diastolic CHF, chronic (HCC)   Tobacco use disorder   Type 2 diabetes mellitus with circulatory disorder, with long-term current use of insulin (HCC)   Wound of right foot   Syncope and collapse   Cocaine abuse ([place])   Subacute osteomyelitis, right ankle and foot (HCC)   Abscess of right foot   Diabetic neuropathy (HCC)   Anemia of chronic disease   Severe obesity (BMI 35.0-39.9) with comorbidity ([place])   Metabolic acidosis   DM (diabetes mellitus), secondary, uncontrolled, with complications (HCC)   Elevated sedimentation rate   Elevated C-reactive protein (CRP)   Hypoalbuminemia   Goals of care, counseling/discussion   Palliative care by specialist   Right foot wound status post BKA on  10/21/19 by Dr. Clarita Leber on the residual limb and had some bleeding on 7/21. Seen by ortho on 7/22, no wound dehiscence Ortho recommends to hold SQ lovenox until the hematoma has resolved.  Will follow up with Ortho as an outpatient. PT has recommended SNF, difficult placement due to complicated social issues, on parole.  Progressive CKD V, now ESRD  Presented with nonoliguric acute renal failure on chronic kidney disease stage IV, hyperkalemia, with evidence of early uremic symptoms.  Post right IJ TDC placement by IR Dr. Earleen Newport, on 11/05/2019 1st session of hemodialysis on 11/06/2019 2nd session of hemodialysis on 11/07/2019 3rd session 11/10/19 Will need HD spot prior to DC, TOC working on SNF placement Highly appreciate nephrology's assistance S/p Left brachiobasilic AV fistula creation  Transient intractable nausea and vomiting Suspect secondary to uremia in the setting of progressive CKD V  Continue hemodialysis  Diabetes mellitus type 2, uncontrolled with hyperglycemia and renal complications [id number] 8.8.  Reduced doses of insulin to avoid hypoglycemia in the setting of new ESRD Lantus reduced to 10 U nightly NovoLog reduced to 4 units 3 times daily Continue with insulin sliding scale, sensitive.  Avoid hypoglycemia  Syncope, no recurrence Likely multifactorial including overdiuresis and acute kidney injury and acute infection.  No further episodes in the hospital.  Continue to monitor  Chronic diastolic CHF EF 50 to 47% based on echocardiogram done in May.  Volume status addressed with hemodialysis Continue to monitor daily weight  Hypotension, currently blood pressures are  soft Maintain MAP greater than 65 Continue to monitor vital signs  Anemia of chronic kidney disease/acute blood loss-bled from his stump, resolved. -trend  Acute blood loss anemia from bleeding stump 7/21, and 7/22. Seen by orthopedic surgery, Dr. Sharol Given, on 7/22, recommended to hold off  Lovenox until hematoma has resolved Hg as reported above Continue to monitor H&H  History of cocaine abuse His urine drug screen from 7/4 was positive for cocaine Cessation counseling done at bedside  Obesity .Body mass index is 34.83 kg/m.  Behavioral issues Patient has exhibited belligerent behavior with staff. He becomes verbally abusive. Appears to have very poor understanding of his multiple medical problems.  His sisters Zachary Young and Zachary Young are his medical decision makers.  Goals of care Seen by palliative care previously.  Palliative care team reconsulted to assist with establishing goals of care, appreciate assistance. His sisters Zachary Young and Zachary Young are his medical decision makers. DNR as of 11/03/2019   DVT Prophylaxis:SCDs Code Status:DNR Disposition Plan:Status is: Inpatient    Status is: Inpatient  Remains inpatient appropriate because:Inpatient level of care appropriate due to severity of illness   Dispo:             Patient From: Home             Planned Disposition: To be determined              Expected discharge date: unclear due to placement issues             Medically not stable for discharge due to now ESRD on hemodialysis requiring a stable disposition and an HD spot prior to discharge.    HPI Back from procedure   Objective: Vitals:   11/13/19 1215 11/13/19 1221 11/13/19 1230 11/13/19 1320  BP:  (!) 131/66 123/65 (!) 144/69  Pulse: 67 69 69 65  Resp: (!) 8 (!) 9 (!) 10 15  Temp:   98.1 F (36.7 C) 98.7 F (37.1 C)  TempSrc:    Oral  SpO2: 100% 93% 96% 96%  Weight:      Height:        Intake/Output Summary (Last 24 hours) at 11/13/2019 1441 Last data filed at 11/13/2019 1241 Gross per 24 hour  Intake 760 ml  Output 1175 ml  Net -415 ml   Filed Weights   11/12/19 0718 11/12/19 1057 11/13/19 0508  Weight: (!) 129.5 kg (!) 126.4 kg (!) 126.4 kg    Sleeping, NAD  Data Reviewed: CBC: Recent Labs  Lab 11/09/19 0212  11/10/19 0434 11/12/19 0745  WBC 7.7 8.1 6.9  NEUTROABS 4.0  --   --   HGB 9.1* 8.9* 8.4*  HCT 29.7* 29.3* 28.3*  MCV 85.8 86.2 86.0  PLT 295 299 517   Basic Metabolic Panel: Recent Labs  Lab 11/06/19 1645 11/07/19 0218 11/08/19 0442 11/09/19 0212 11/09/19 0706 11/10/19 0434 11/11/19 0702 11/12/19 0745  NA 136   < > 136 138  --  140 139 138  K 4.4   < > 4.4 4.4  --  4.1 3.9 3.9  CL 105   < > 104 105  --  107 105 104  CO2 19*   < > 22 24  --  24 24 25   GLUCOSE 182*   < > 175* 328*  --  154* 122* 158*  BUN 88*   < > 63* 72*  --  70* 46* 54*  CREATININE 5.27*   < > 4.19* 5.14*  --  4.81*  3.84* 4.38*  CALCIUM 9.2   < > 8.9 8.7*  --  8.3* 8.2* 8.2*  PHOS 5.2*  --   --   --  3.4  --   --   --    < > = values in this interval not displayed.   GFR: Estimated Creatinine Clearance: 26.7 mL/min (A) (by C-G formula based on SCr of 4.38 mg/dL (H)). Liver Function Tests: Recent Labs  Lab 11/06/19 1645  ALBUMIN 2.5*   No results for input(s): LIPASE, AMYLASE in the last 168 hours. No results for input(s): AMMONIA in the last 168 hours. Coagulation Profile: No results for input(s): INR, PROTIME in the last 168 hours. Cardiac Enzymes: No results for input(s): CKTOTAL, CKMB, CKMBINDEX, TROPONINI in the last 168 hours. BNP (last 3 results) No results for input(s): PROBNP in the last 8760 hours. HbA1C: No results for input(s): HGBA1C in the last 72 hours. CBG: Recent Labs  Lab 11/12/19 1142 11/12/19 1604 11/12/19 2135 11/13/19 0614 11/13/19 1205  GLUCAP 118* 139* 105* 95 111*   Lipid Profile: No results for input(s): CHOL, HDL, LDLCALC, TRIG, CHOLHDL, LDLDIRECT in the last 72 hours. Thyroid Function Tests: No results for input(s): TSH, T4TOTAL, FREET4, T3FREE, THYROIDAB in the last 72 hours. Anemia Panel: No results for input(s): VITAMINB12, FOLATE, FERRITIN, TIBC, IRON, RETICCTPCT in the last 72 hours. Urine analysis:    Component Value Date/Time   COLORURINE YELLOW  08/15/2019 1538   APPEARANCEUR CLEAR 08/15/2019 1538   LABSPEC 1.010 08/15/2019 1538   PHURINE 5.0 08/15/2019 1538   GLUCOSEU NEGATIVE 08/15/2019 1538   HGBUR SMALL (A) 08/15/2019 1538   BILIRUBINUR NEGATIVE 08/15/2019 1538   KETONESUR NEGATIVE 08/15/2019 1538   PROTEINUR >=300 (A) 08/15/2019 1538   NITRITE NEGATIVE 08/15/2019 1538   LEUKOCYTESUR NEGATIVE 08/15/2019 1538     )No results found for this or any previous visit (from the past 240 hour(s)).    Studies: No results found.  Scheduled Meds: . acetaminophen      . aspirin EC  81 mg Oral Daily  . celecoxib      . Chlorhexidine Gluconate Cloth  6 each Topical Q0600  . collagenase   Topical Daily  . docusate sodium  100 mg Oral BID  . gabapentin  200 mg Oral BID  . insulin aspart  0-9 Units Subcutaneous TID WC  . insulin aspart  4 Units Subcutaneous TID WC  . insulin glargine  15 Units Subcutaneous QHS  . multivitamin with minerals  1 tablet Oral Daily  . nutrition supplement (JUVEN)  1 packet Oral BID BM  . prochlorperazine  10 mg Intravenous QHS  . Ensure Max Protein  11 oz Oral BID  . sodium chloride flush  3 mL Intravenous Q12H    Continuous Infusions: . sodium chloride 10 mL/hr at 11/13/19 0949  . ferric gluconate (FERRLECIT/NULECIT) IV 125 mg (11/11/19 1258)     LOS: 27 days     Geradine Girt, DO [facility]  If 7PM-7AM, please contact night-coverage  11/13/2019, 2:41 PM

## 2019-11-13 NOTE — Care Management (Addendum)
27 Update [name] with [facility] called , unfortunately, after reviewing more clinicals ,they are unable to offer a SNF bed at this time.  Colleen aware.   Patient's sister [name] [phone] aware , no SNF bed offers at this time. Will email TOC lead .     Spoke to patient's sister [name] via phone. Gave SNF offers. She prefers Counselling psychologist in [place].   TOC team has been unable to reach patient's parole officer [name] [phone].   [name] also has not been able to reach [employer] [phone] the last week. Per [place] legal guidance, family needs to make parole officer aware of SNF placement and location. [name] aware and will call the probation office to see if she can reach someone.    Left Left [name] Renal Navigator a message with [name] choice.    Spoke to [employer] with [facility] she will review referral . [name] aware patient will need transportation to OP HD.    [name] RN

## 2019-11-13 NOTE — Anesthesia Preprocedure Evaluation (Addendum)
Anesthesia Evaluation  Patient identified by MRN, date of birth, ID band Patient awake    Reviewed: Allergy & Precautions, H&P , NPO status , Patient's Chart, lab work & pertinent test results  History of Anesthesia Complications Negative for: history of anesthetic complications  Airway Mallampati: II  TM Distance: >3 FB Neck ROM: Full    Dental  (+) Poor Dentition, Missing, Dental Advisory Given   Pulmonary Current Smoker,    Pulmonary exam normal        Cardiovascular hypertension, +CHF   Rhythm:Regular Rate:Normal     Neuro/Psych negative neurological ROS  negative psych ROS   GI/Hepatic negative GI ROS, Neg liver ROS,   Endo/Other  diabetes, Type 2obese  Renal/GU Renal InsufficiencyRenal disease     Musculoskeletal   Abdominal   Peds  Hematology  (+) anemia ,   Anesthesia Other Findings   Reproductive/Obstetrics                            Anesthesia Physical  Anesthesia Plan  ASA: IV  Anesthesia Plan: General   Post-op Pain Management:    Induction: Intravenous  PONV Risk Score and Plan: 1 and Ondansetron, Dexamethasone, Midazolam and Treatment may vary due to age or medical condition  Airway Management Planned: LMA  Additional Equipment:   Intra-op Plan:   Post-operative Plan: Extubation in OR  Informed Consent: I have reviewed the patients History and Physical, chart, labs and discussed the procedure including the risks, benefits and alternatives for the proposed anesthesia with the patient or authorized representative who has indicated his/her understanding and acceptance.     Dental advisory given  Plan Discussed with: Anesthesiologist and CRNA  Anesthesia Plan Comments:        Anesthesia Quick Evaluation

## 2019-11-13 NOTE — Op Note (Signed)
    OPERATIVE REPORT  DATE OF SURGERY: 11/13/2019  PATIENT: Zachary Young, 59 y.o. male MRN: [id number]  DOB: 1961/03/28  PRE-OPERATIVE DIAGNOSIS: End-stage renal disease  POST-OPERATIVE DIAGNOSIS:  Same  PROCEDURE: Left brachiobasilic AV fistula creation  SURGEON:  Curt Jews, M.D.  PHYSICIAN ASSISTANT: Laurence Slate, PA-C  The assistant was needed for exposure and to expedite the case  ANESTHESIA: General  EBL: per anesthesia record  Total I/O In: 550 [I.V.:200; IV Piggyback:350] Out: 375 [Urine:350; Blood:25]  BLOOD ADMINISTERED: none  DRAINS: none  SPECIMEN: none  COUNTS CORRECT:  YES  PATIENT DISPOSITION:  PACU - hemodynamically stable  PROCEDURE DETAILS: The patient was taken to the operating placed supine position where the area of the left arm was prepped draped in sterile fashion.  SonoSite ultrasound was used to visualize the veins in the arm.  The cephalic vein was extremely small.  The patient did have a good caliber basilic vein above the elbow.  There were multiple branches at the level of the elbow.  An incision was made longitudinally between the level of the basilic vein and the brachial artery.  Tributary branches of the vein were ligated and divided.  The vein was ligated distally and divided and was gently dilated with heparinized saline.  The brachial artery was exposed through the same incision.  The vein was mobilized to the brachial artery.  The artery was occluded proximally and distally and was opened with 11 blade and sent longitudinally with Potts scissors.  The vein was cut to the appropriate length and was spatulated and sewn end-to-side to the artery with a running 6-0 Prolene suture.  Clamps removed and excellent thrill was noted.  The wounds were irrigated with saline.  Hemostasis to electrocautery.  The wounds were closed with 3-0 Vicryl in the subcutaneous and subcuticular tissue.  Sterile dressing was applied and the patient was transferred to  the recovery room in stable condition   Zachary Young, M.D., [facility] 11/13/2019 11:59 AM

## 2019-11-13 NOTE — Anesthesia Procedure Notes (Addendum)
Procedure Name: LMA Insertion Date/Time: 11/13/2019 10:02 AM Performed by: Jenne Campus, CRNA Pre-anesthesia Checklist: Patient identified, Emergency Drugs available, Suction available and Patient being monitored Patient Re-evaluated:Patient Re-evaluated prior to induction Oxygen Delivery Method: Circle System Utilized Preoxygenation: Pre-oxygenation with 100% oxygen Induction Type: IV induction Ventilation: Mask ventilation without difficulty LMA: LMA inserted LMA Size: 5.0 Number of attempts: 1 Airway Equipment and Method: Bite block Placement Confirmation: positive ETCO2 and breath sounds checked- equal and bilateral Tube secured with: Tape Dental Injury: Teeth and Oropharynx as per pre-operative assessment

## 2019-11-14 ENCOUNTER — Encounter (HOSPITAL_COMMUNITY): Payer: Self-pay | Admitting: Vascular Surgery

## 2019-11-14 LAB — RENAL FUNCTION PANEL
Albumin: 2.6 g/dL — ABNORMAL LOW (ref 3.5–5.0)
Anion gap: 12 (ref 5–15)
BUN: 53 mg/dL — ABNORMAL HIGH (ref 6–20)
CO2: 24 mmol/L (ref 22–32)
Calcium: 8.3 mg/dL — ABNORMAL LOW (ref 8.9–10.3)
Chloride: 98 mmol/L (ref 98–111)
Creatinine, Ser: 4.73 mg/dL — ABNORMAL HIGH (ref 0.61–1.24)
GFR calc Af Amer: 15 mL/min — ABNORMAL LOW (ref >60)
GFR calc non Af Amer: 13 mL/min — ABNORMAL LOW (ref >60)
Glucose, Bld: 274 mg/dL — ABNORMAL HIGH (ref 70–99)
Phosphorus: 4 mg/dL (ref 2.5–4.6)
Potassium: 4.2 mmol/L (ref 3.5–5.1)
Sodium: 134 mmol/L — ABNORMAL LOW (ref 135–145)

## 2019-11-14 LAB — GLUCOSE, CAPILLARY
Glucose-Capillary: 253 mg/dL — ABNORMAL HIGH (ref 70–99)
Glucose-Capillary: 195 mg/dL — ABNORMAL HIGH (ref 70–99)
Glucose-Capillary: 264 mg/dL — ABNORMAL HIGH (ref 70–99)
Glucose-Capillary: 293 mg/dL — ABNORMAL HIGH (ref 70–99)

## 2019-11-14 LAB — CBC
HCT: 28.9 % — ABNORMAL LOW (ref 39.0–52.0)
Hemoglobin: 8.7 g/dL — ABNORMAL LOW (ref 13.0–17.0)
MCH: 25.8 pg — ABNORMAL LOW (ref 26.0–34.0)
MCHC: 30.1 g/dL (ref 30.0–36.0)
MCV: 85.8 fL (ref 80.0–100.0)
Platelets: 257 10*3/uL (ref 150–400)
RBC: 3.37 MIL/uL — ABNORMAL LOW (ref 4.22–5.81)
RDW: 15.7 % — ABNORMAL HIGH (ref 11.5–15.5)
WBC: 10.3 10*3/uL (ref 4.0–10.5)
nRBC: 0 % (ref 0.0–0.2)

## 2019-11-14 NOTE — Procedures (Signed)
I was present at this dialysis session. I have reviewed the session itself and made appropriate changes.   3K. TDC Qb 400. 4L UF goal. Pt tolerating treatment. No c/o. LUE AVF +B this AM  Filed Weights   11/12/19 1057 11/13/19 0508 11/14/19 0730  Weight: (!) 126.4 kg (!) 126.4 kg (!) 132.2 kg    Recent Labs  Lab 11/09/19 0706 11/10/19 0434 11/12/19 0745  NA  --    < > 138  K  --    < > 3.9  CL  --    < > 104  CO2  --    < > 25  GLUCOSE  --    < > 158*  BUN  --    < > 54*  CREATININE  --    < > 4.38*  CALCIUM  --    < > 8.2*  PHOS 3.4  --   --    < > = values in this interval not displayed.    Recent Labs  Lab 11/09/19 0212 11/09/19 0212 11/10/19 0434 11/12/19 0745 11/14/19 0730  WBC 7.7   < > 8.1 6.9 10.3  NEUTROABS 4.0  --   --   --   --   HGB 9.1*   < > 8.9* 8.4* 8.7*  HCT 29.7*   < > 29.3* 28.3* 28.9*  MCV 85.8   < > 86.2 86.0 85.8  PLT 295   < > 299 258 257   < > = values in this interval not displayed.    Scheduled Meds: . aspirin EC  81 mg Oral Daily  . Chlorhexidine Gluconate Cloth  6 each Topical Q0600  . collagenase   Topical Daily  . docusate sodium  100 mg Oral BID  . gabapentin  200 mg Oral BID  . insulin aspart  0-9 Units Subcutaneous TID WC  . insulin aspart  4 Units Subcutaneous TID WC  . insulin glargine  15 Units Subcutaneous QHS  . multivitamin with minerals  1 tablet Oral Daily  . nutrition supplement (JUVEN)  1 packet Oral BID BM  . prochlorperazine  10 mg Intravenous QHS  . Ensure Max Protein  11 oz Oral BID  . sodium chloride flush  3 mL Intravenous Q12H   Continuous Infusions: . sodium chloride 10 mL/hr at 11/13/19 0949  . ferric gluconate (FERRLECIT/NULECIT) IV 125 mg (11/11/19 1258)   PRN Meds:.albuterol, HYDROmorphone (DILAUDID) injection, hydrOXYzine, ondansetron **OR** ondansetron (ZOFRAN) IV, oxyCODONE-acetaminophen, prochlorperazine   Pearson Grippe  MD 11/14/2019, 8:26 AM

## 2019-11-14 NOTE — Progress Notes (Signed)
   VASCULAR SURGERY ASSESSMENT & PLAN:   POD 1 S/P FIRST STAGE BASILIC VEIN TRANSPOSITION: The fistula has an excellent thrill.  The patient has a palpable radial pulse.   Follow-up has been arranged.  Vascular surgery will be available as needed.  SUBJECTIVE:   No complaints  PHYSICAL EXAM:   Vitals:   11/13/19 1320 11/13/19 1613 11/14/19 0055 11/14/19 0604  BP: (!) 144/69 123/69 (!) 132/71 120/71  Pulse: 65 84 68 70  Resp: 15 15 17 16   Temp: 98.7 F (37.1 C) 99 F (37.2 C) 97.8 F (36.6 C) 97.9 F (36.6 C)  TempSrc: Oral Oral Oral Oral  SpO2: 96% 96% 98% 99%  Weight:      Height:       Good thrill in left arm fistula. Palpable left radial pulse. His incision looks fine.  LABS:   Lab Results  Component Value Date   WBC 6.9 11/12/2019   HGB 8.4 (L) 11/12/2019   HCT 28.3 (L) 11/12/2019   MCV 86.0 11/12/2019   PLT 258 11/12/2019   CBG (last 3)  Recent Labs    11/13/19 1316 11/13/19 1609 11/14/19 0601  GLUCAP 150* 337* 253*    PROBLEM LIST:    Active Problems:   Hypertensive heart disease with CHF (congestive heart failure) (HCC)   Chronic kidney disease, stage 4 (severe) (HCC)   HLD (hyperlipidemia)   HTN (hypertension)   Systolic and diastolic CHF, chronic (HCC)   Tobacco use disorder   Type 2 diabetes mellitus with circulatory disorder, with long-term current use of insulin (HCC)   Wound of right foot   Syncope and collapse   Cocaine abuse ([place])   Subacute osteomyelitis, right ankle and foot (HCC)   Abscess of right foot   Diabetic neuropathy (HCC)   Anemia of chronic disease   Severe obesity (BMI 35.0-39.9) with comorbidity ([place])   Metabolic acidosis   DM (diabetes mellitus), secondary, uncontrolled, with complications (HCC)   Elevated sedimentation rate   Elevated C-reactive protein (CRP)   Hypoalbuminemia   Goals of care, counseling/discussion   Palliative care by specialist   CURRENT MEDS:   . aspirin EC  81 mg Oral Daily  .  Chlorhexidine Gluconate Cloth  6 each Topical Q0600  . collagenase   Topical Daily  . docusate sodium  100 mg Oral BID  . gabapentin  200 mg Oral BID  . insulin aspart  0-9 Units Subcutaneous TID WC  . insulin aspart  4 Units Subcutaneous TID WC  . insulin glargine  15 Units Subcutaneous QHS  . multivitamin with minerals  1 tablet Oral Daily  . nutrition supplement (JUVEN)  1 packet Oral BID BM  . prochlorperazine  10 mg Intravenous QHS  . Ensure Max Protein  11 oz Oral BID  . sodium chloride flush  3 mL Intravenous Q12H    Deitra Mayo Office: [phone] 11/14/2019

## 2019-11-14 NOTE — Progress Notes (Signed)
PROGRESS NOTE  Zachary Young VXY:[id number] DOB: March 11, 1961 DOA: 10/17/2019 PCP: System, Pcp Not In  HPI/Recap of past 24 hours:  [name] PMHx Post right below the knee amputation on 10/21/2019 by orthopedic surgery Dr. Sharol Given. Hospital course complicated by worsening renal function in the setting of progressive CKD V for which nephrology was consulted.  Has very poor insight into his medical condition.  His 2 sisters [name] and [name] are his medical decision makers.  Per his sister [name] via phone he is presently homeless.  He has difficulty understanding the severity of his medical condition.  Her along with her sister [name] are making medical decisions for him.    Patient is on parole and his sister [name] is working to lift it up in order to facilitate his placement to SNF.    Post right IJ TDC placement by IR on 11/05/2019.  1st HD completed on 11/06/2019.  2nd HD planned on 11/07/2019.  3rd HD completed on 11/10/2019.  Difficult placement, TOC assisting with SNF placement.     Assessment/Plan: Active Problems:   Hypertensive heart disease with CHF (congestive heart failure) (HCC)   Chronic kidney disease, stage 4 (severe) (HCC)   HLD (hyperlipidemia)   HTN (hypertension)   Systolic and diastolic CHF, chronic (HCC)   Tobacco use disorder   Type 2 diabetes mellitus with circulatory disorder, with long-term current use of insulin (HCC)   Wound of right foot   Syncope and collapse   Cocaine abuse ([place])   Subacute osteomyelitis, right ankle and foot (HCC)   Abscess of right foot   Diabetic neuropathy (HCC)   Anemia of chronic disease   Severe obesity (BMI 35.0-39.9) with comorbidity ([place])   Metabolic acidosis   DM (diabetes mellitus), secondary, uncontrolled, with complications (HCC)   Elevated sedimentation rate   Elevated C-reactive protein (CRP)   Hypoalbuminemia   Goals of care, counseling/discussion   Palliative care by specialist   Right foot wound status post BKA on  10/21/19 by Dr. Clarita Leber on the residual limb and had some bleeding on 7/21. Seen by ortho on 7/22, no wound dehiscence Ortho recommends to hold SQ heparin until the hematoma has resolved.  Will follow up with Ortho as an outpatient. PT has recommended SNF, difficult placement due to complicated social issues, on parole -plan to resume heparin in the AM.  Progressive CKD V, now ESRD  Presented with nonoliguric acute renal failure on chronic kidney disease stage IV, hyperkalemia, with evidence of early uremic symptoms.  Post right IJ TDC placement by IR Dr. Earleen Newport, on 11/05/2019 1st session of hemodialysis on 11/06/2019 2nd session of hemodialysis on 11/07/2019 3rd session 11/10/19 Will need HD spot prior to DC, TOC working on SNF placement Highly appreciate nephrology's assistance S/p Left brachiobasilic AV fistula creation  Transient intractable nausea and vomiting Suspect secondary to uremia in the setting of progressive CKD V  Continue hemodialysis  Diabetes mellitus type 2, uncontrolled with hyperglycemia and renal complications MOL0B 8.8.  Reduced doses of insulin to avoid hypoglycemia in the setting of new ESRD Lantus reduced to 10 U nightly NovoLog reduced to 4 units 3 times daily Continue with insulin sliding scale, sensitive.  Avoid hypoglycemia  Syncope, no recurrence Likely multifactorial including overdiuresis and acute kidney injury and acute infection.  No further episodes in the hospital.  Continue to monitor  Chronic diastolic CHF EF 50 to 86% based on echocardiogram done in May.  Volume status addressed with hemodialysis Continue to monitor daily  weight  Hypotension, currently blood pressures are soft Maintain MAP greater than 65 Continue to monitor vital signs  Anemia of chronic kidney disease/acute blood loss-bled from his stump, resolved. -trend  Acute blood loss anemia from bleeding stump 7/21, and 7/22. Seen by orthopedic surgery, Dr. Sharol Given,  on 7/22, recommended to hold off Lovenox until hematoma has resolved Hg as reported above Continue to monitor H&H  History of cocaine abuse His urine drug screen from 7/4 was positive for cocaine Cessation counseling done at bedside  Obesity .Body mass index is 36.43 kg/m.  Behavioral issues Patient has exhibited belligerent behavior with staff. He becomes verbally abusive. Appears to have very poor understanding of his multiple medical problems.  His sisters [name] and [name] are his medical decision makers.  Goals of care Seen by palliative care previously.  Palliative care team reconsulted to assist with establishing goals of care, appreciate assistance. His sisters [name] and [name] are his medical decision makers. DNR as of 11/03/2019   DVT Prophylaxis:SCDs- will plan to resume heparin in the AM Code Status:DNR Disposition Plan:Status is: Inpatient    Status is: Inpatient  Remains inpatient appropriate because:Inpatient level of care appropriate due to severity of illness   Dispo:             Patient From: Home             Planned Disposition: To be determined              Expected discharge date: unclear due to placement issues             Medically not stable for discharge due to now ESRD on hemodialysis requiring a stable disposition and an HD spot prior to discharge.    HPI In HD   Objective: Vitals:   11/14/19 1030 11/14/19 1100 11/14/19 1130 11/14/19 1245  BP: (!) 122/52 (!) 111/57 (!) 99/53 104/69  Pulse: 76 86 87 79  Resp:    16  Temp:    97.9 F (36.6 C)  TempSrc:    Oral  SpO2:    100%  Weight:      Height:        Intake/Output Summary (Last 24 hours) at 11/14/2019 1340 Last data filed at 11/13/2019 2211 Gross per 24 hour  Intake --  Output 925 ml  Net -925 ml   Filed Weights   11/12/19 1057 11/13/19 0508 11/14/19 0730  Weight: (!) 126.4 kg (!) 126.4 kg (!) 132.2 kg    In HD, NAD  Data Reviewed: CBC: Recent Labs  Lab  11/09/19 0212 11/10/19 0434 11/12/19 0745 11/14/19 0730  WBC 7.7 8.1 6.9 10.3  NEUTROABS 4.0  --   --   --   HGB 9.1* 8.9* 8.4* 8.7*  HCT 29.7* 29.3* 28.3* 28.9*  MCV 85.8 86.2 86.0 85.8  PLT 295 299 258 409   Basic Metabolic Panel: Recent Labs  Lab 11/09/19 0212 11/09/19 0706 11/10/19 0434 11/11/19 0702 11/12/19 0745 11/14/19 0730  NA 138  --  140 139 138 134*  K 4.4  --  4.1 3.9 3.9 4.2  CL 105  --  107 105 104 98  CO2 24  --  24 24 25 24   GLUCOSE 328*  --  154* 122* 158* 274*  BUN 72*  --  70* 46* 54* 53*  CREATININE 5.14*  --  4.81* 3.84* 4.38* 4.73*  CALCIUM 8.7*  --  8.3* 8.2* 8.2* 8.3*  PHOS  --  3.4  --   --   --  4.0   GFR: Estimated Creatinine Clearance: 25.2 mL/min (A) (by C-G formula based on SCr of 4.73 mg/dL (H)). Liver Function Tests: Recent Labs  Lab 11/14/19 0730  ALBUMIN 2.6*   No results for input(s): LIPASE, AMYLASE in the last 168 hours. No results for input(s): AMMONIA in the last 168 hours. Coagulation Profile: No results for input(s): INR, PROTIME in the last 168 hours. Cardiac Enzymes: No results for input(s): CKTOTAL, CKMB, CKMBINDEX, TROPONINI in the last 168 hours. BNP (last 3 results) No results for input(s): PROBNP in the last 8760 hours. HbA1C: No results for input(s): HGBA1C in the last 72 hours. CBG: Recent Labs  Lab 11/13/19 1205 11/13/19 1316 11/13/19 1609 11/14/19 0601 11/14/19 1250  GLUCAP 111* 150* 337* 253* 195*   Lipid Profile: No results for input(s): CHOL, HDL, LDLCALC, TRIG, CHOLHDL, LDLDIRECT in the last 72 hours. Thyroid Function Tests: No results for input(s): TSH, T4TOTAL, FREET4, T3FREE, THYROIDAB in the last 72 hours. Anemia Panel: No results for input(s): VITAMINB12, FOLATE, FERRITIN, TIBC, IRON, RETICCTPCT in the last 72 hours. Urine analysis:    Component Value Date/Time   COLORURINE YELLOW 08/15/2019 1538   APPEARANCEUR CLEAR 08/15/2019 1538   LABSPEC 1.010 08/15/2019 1538   PHURINE 5.0  08/15/2019 1538   GLUCOSEU NEGATIVE 08/15/2019 1538   HGBUR SMALL (A) 08/15/2019 1538   BILIRUBINUR NEGATIVE 08/15/2019 1538   KETONESUR NEGATIVE 08/15/2019 1538   PROTEINUR >=300 (A) 08/15/2019 1538   NITRITE NEGATIVE 08/15/2019 1538   LEUKOCYTESUR NEGATIVE 08/15/2019 1538     )No results found for this or any previous visit (from the past 240 hour(s)).    Studies: No results found.  Scheduled Meds: . aspirin EC  81 mg Oral Daily  . Chlorhexidine Gluconate Cloth  6 each Topical Q0600  . collagenase   Topical Daily  . docusate sodium  100 mg Oral BID  . gabapentin  200 mg Oral BID  . insulin aspart  0-9 Units Subcutaneous TID WC  . insulin aspart  4 Units Subcutaneous TID WC  . insulin glargine  15 Units Subcutaneous QHS  . multivitamin with minerals  1 tablet Oral Daily  . nutrition supplement (JUVEN)  1 packet Oral BID BM  . prochlorperazine  10 mg Intravenous QHS  . Ensure Max Protein  11 oz Oral BID  . sodium chloride flush  3 mL Intravenous Q12H    Continuous Infusions: . sodium chloride 10 mL/hr at 11/13/19 0949  . ferric gluconate (FERRLECIT/NULECIT) IV 125 mg (11/14/19 1144)     LOS: 28 days     Geradine Girt, DO [facility]  If 7PM-7AM, please contact night-coverage  11/14/2019, 1:40 PM

## 2019-11-15 DIAGNOSIS — S91301A Unspecified open wound, right foot, initial encounter: Secondary | ICD-10-CM | POA: Diagnosis not present

## 2019-11-15 DIAGNOSIS — E872 Acidosis: Secondary | ICD-10-CM | POA: Diagnosis present

## 2019-11-15 DIAGNOSIS — D62 Acute posthemorrhagic anemia: Secondary | ICD-10-CM | POA: Diagnosis not present

## 2019-11-15 DIAGNOSIS — F1410 Cocaine abuse, uncomplicated: Secondary | ICD-10-CM | POA: Diagnosis not present

## 2019-11-15 DIAGNOSIS — T8131XA Disruption of external operation (surgical) wound, not elsewhere classified, initial encounter: Secondary | ICD-10-CM | POA: Diagnosis not present

## 2019-11-15 DIAGNOSIS — Y92239 Unspecified place in hospital as the place of occurrence of the external cause: Secondary | ICD-10-CM | POA: Diagnosis not present

## 2019-11-15 DIAGNOSIS — Z7982 Long term (current) use of aspirin: Secondary | ICD-10-CM | POA: Diagnosis not present

## 2019-11-15 DIAGNOSIS — E1169 Type 2 diabetes mellitus with other specified complication: Secondary | ICD-10-CM | POA: Diagnosis present

## 2019-11-15 DIAGNOSIS — I70261 Atherosclerosis of native arteries of extremities with gangrene, right leg: Secondary | ICD-10-CM | POA: Diagnosis present

## 2019-11-15 DIAGNOSIS — E1142 Type 2 diabetes mellitus with diabetic polyneuropathy: Secondary | ICD-10-CM | POA: Diagnosis present

## 2019-11-15 DIAGNOSIS — E1152 Type 2 diabetes mellitus with diabetic peripheral angiopathy with gangrene: Secondary | ICD-10-CM | POA: Diagnosis present

## 2019-11-15 DIAGNOSIS — F17210 Nicotine dependence, cigarettes, uncomplicated: Secondary | ICD-10-CM | POA: Diagnosis present

## 2019-11-15 DIAGNOSIS — I872 Venous insufficiency (chronic) (peripheral): Secondary | ICD-10-CM | POA: Diagnosis present

## 2019-11-15 DIAGNOSIS — Z992 Dependence on renal dialysis: Secondary | ICD-10-CM | POA: Diagnosis not present

## 2019-11-15 DIAGNOSIS — Z794 Long term (current) use of insulin: Secondary | ICD-10-CM | POA: Diagnosis not present

## 2019-11-15 DIAGNOSIS — Z79899 Other long term (current) drug therapy: Secondary | ICD-10-CM | POA: Diagnosis not present

## 2019-11-15 DIAGNOSIS — N179 Acute kidney failure, unspecified: Secondary | ICD-10-CM | POA: Diagnosis not present

## 2019-11-15 DIAGNOSIS — E11621 Type 2 diabetes mellitus with foot ulcer: Secondary | ICD-10-CM | POA: Diagnosis present

## 2019-11-15 DIAGNOSIS — M86271 Subacute osteomyelitis, right ankle and foot: Secondary | ICD-10-CM | POA: Diagnosis present

## 2019-11-15 DIAGNOSIS — E1159 Type 2 diabetes mellitus with other circulatory complications: Secondary | ICD-10-CM | POA: Diagnosis present

## 2019-11-15 DIAGNOSIS — D638 Anemia in other chronic diseases classified elsewhere: Secondary | ICD-10-CM | POA: Diagnosis not present

## 2019-11-15 DIAGNOSIS — N184 Chronic kidney disease, stage 4 (severe): Secondary | ICD-10-CM | POA: Diagnosis not present

## 2019-11-15 DIAGNOSIS — Y835 Amputation of limb(s) as the cause of abnormal reaction of the patient, or of later complication, without mention of misadventure at the time of the procedure: Secondary | ICD-10-CM | POA: Diagnosis not present

## 2019-11-15 DIAGNOSIS — N186 End stage renal disease: Secondary | ICD-10-CM | POA: Diagnosis not present

## 2019-11-15 DIAGNOSIS — Z89412 Acquired absence of left great toe: Secondary | ICD-10-CM | POA: Diagnosis not present

## 2019-11-15 DIAGNOSIS — G546 Phantom limb syndrome with pain: Secondary | ICD-10-CM | POA: Diagnosis not present

## 2019-11-15 DIAGNOSIS — R55 Syncope and collapse: Secondary | ICD-10-CM | POA: Diagnosis present

## 2019-11-15 DIAGNOSIS — E138 Other specified diabetes mellitus with unspecified complications: Secondary | ICD-10-CM | POA: Diagnosis not present

## 2019-11-15 DIAGNOSIS — Z515 Encounter for palliative care: Secondary | ICD-10-CM | POA: Diagnosis not present

## 2019-11-15 DIAGNOSIS — Z66 Do not resuscitate: Secondary | ICD-10-CM | POA: Diagnosis not present

## 2019-11-15 DIAGNOSIS — Z87898 Personal history of other specified conditions: Secondary | ICD-10-CM | POA: Diagnosis not present

## 2019-11-15 DIAGNOSIS — Z89511 Acquired absence of right leg below knee: Secondary | ICD-10-CM | POA: Diagnosis not present

## 2019-11-15 DIAGNOSIS — I5042 Chronic combined systolic (congestive) and diastolic (congestive) heart failure: Secondary | ICD-10-CM | POA: Diagnosis present

## 2019-11-15 DIAGNOSIS — Z91030 Bee allergy status: Secondary | ICD-10-CM | POA: Diagnosis not present

## 2019-11-15 DIAGNOSIS — Z89422 Acquired absence of other left toe(s): Secondary | ICD-10-CM | POA: Diagnosis not present

## 2019-11-15 DIAGNOSIS — Z885 Allergy status to narcotic agent status: Secondary | ICD-10-CM | POA: Diagnosis not present

## 2019-11-15 DIAGNOSIS — L02611 Cutaneous abscess of right foot: Secondary | ICD-10-CM | POA: Diagnosis not present

## 2019-11-15 DIAGNOSIS — Z20822 Contact with and (suspected) exposure to covid-19: Secondary | ICD-10-CM | POA: Diagnosis present

## 2019-11-15 DIAGNOSIS — L97519 Non-pressure chronic ulcer of other part of right foot with unspecified severity: Secondary | ICD-10-CM | POA: Diagnosis present

## 2019-11-15 DIAGNOSIS — W19XXXA Unspecified fall, initial encounter: Secondary | ICD-10-CM | POA: Diagnosis not present

## 2019-11-15 DIAGNOSIS — I132 Hypertensive heart and chronic kidney disease with heart failure and with stage 5 chronic kidney disease, or end stage renal disease: Secondary | ICD-10-CM | POA: Diagnosis present

## 2019-11-15 LAB — GLUCOSE, CAPILLARY
Glucose-Capillary: 114 mg/dL — ABNORMAL HIGH (ref 70–99)
Glucose-Capillary: 198 mg/dL — ABNORMAL HIGH (ref 70–99)
Glucose-Capillary: 182 mg/dL — ABNORMAL HIGH (ref 70–99)
Glucose-Capillary: 228 mg/dL — ABNORMAL HIGH (ref 70–99)

## 2019-11-15 MED ORDER — HEPARIN SODIUM (PORCINE) 5000 UNIT/ML IJ SOLN
5000.0000 [IU] | Freq: Three times a day (TID) | INTRAMUSCULAR | Status: DC
  Administered 2019-11-16 – 2019-12-12 (×72): 5000 [IU] via SUBCUTANEOUS
  Filled 2019-11-15 (×71): qty 1

## 2019-11-15 NOTE — Progress Notes (Signed)
Admit: 10/17/2019 LOS: 29  68M new ESRD, several social barriers  Subjective:   NAEO  HD yesterday 2.6L UF  07/31 0701 - 08/01 0700 In: 240 [P.O.:240] Out: 2612   Filed Weights   11/13/19 0508 11/14/19 0730 11/14/19 1200  Weight: (!) 126.4 kg (!) 132.2 kg (!) 128.3 kg    Scheduled Meds: . aspirin EC  81 mg Oral Daily  . Chlorhexidine Gluconate Cloth  6 each Topical Q0600  . collagenase   Topical Daily  . docusate sodium  100 mg Oral BID  . gabapentin  200 mg Oral BID  . insulin aspart  0-9 Units Subcutaneous TID WC  . insulin aspart  4 Units Subcutaneous TID WC  . insulin glargine  15 Units Subcutaneous QHS  . multivitamin with minerals  1 tablet Oral Daily  . nutrition supplement (JUVEN)  1 packet Oral BID BM  . prochlorperazine  10 mg Intravenous QHS  . Ensure Max Protein  11 oz Oral BID  . sodium chloride flush  3 mL Intravenous Q12H   Continuous Infusions: . sodium chloride 10 mL/hr at 11/13/19 0949  . ferric gluconate (FERRLECIT/NULECIT) IV 125 mg (11/14/19 1144)   PRN Meds:.albuterol, HYDROmorphone (DILAUDID) injection, hydrOXYzine, ondansetron **OR** ondansetron (ZOFRAN) IV, oxyCODONE-acetaminophen, prochlorperazine  Current Labs: reviewed    Physical Exam:  Blood pressure (!) 134/67, pulse 75, temperature 98 F (36.7 C), temperature source Oral, resp. rate 18, height 6\' 3"  (1.905 m), weight (!) 128.3 kg, SpO2 98 %. NAD R IJ TDC C/D/I RRR CTAB RLE amputation, LLE 1+edema LUE BBF +B/T  A 1. New ESRD started 7/23 via [place] 1. CLIP in process, dep't upon SNF, could be difficult 2. THS schedule 3. R IJ TDC, s/p L BBF AVF 11/13/19 2. Metabolic Acidosis, resolved 3. Hyperkalemia, resolved 4. Homeless 5. Cocaine use 6. DM2 7. Anemia, on IV Fe, ESA, Hb stable 8. CKD-BMD: Ca and P ok 9. HTN, BPs ok, hypervolemic  P . HD on THS schedule: 4h, 3-4L UF, SBP > 105, No heparin, 3K, 400/800 . CLIP / SNF in process . Medication Issues; o Preferred narcotic  agents for pain control are hydromorphone, fentanyl, and methadone. Morphine should not be used.  o Baclofen should be avoided o Avoid oral sodium phosphate and magnesium citrate based laxatives / bowel preps    Pearson Grippe MD 11/15/2019, 11:02 AM  Recent Labs  Lab 11/09/19 0706 11/10/19 0434 11/11/19 0702 11/12/19 0745 11/14/19 0730  NA  --    < > 139 138 134*  K  --    < > 3.9 3.9 4.2  CL  --    < > 105 104 98  CO2  --    < > 24 25 24   GLUCOSE  --    < > 122* 158* 274*  BUN  --    < > 46* 54* 53*  CREATININE  --    < > 3.84* 4.38* 4.73*  CALCIUM  --    < > 8.2* 8.2* 8.3*  PHOS 3.4  --   --   --  4.0   < > = values in this interval not displayed.   Recent Labs  Lab 11/09/19 0212 11/09/19 0212 11/10/19 0434 11/12/19 0745 11/14/19 0730  WBC 7.7   < > 8.1 6.9 10.3  NEUTROABS 4.0  --   --   --   --   HGB 9.1*   < > 8.9* 8.4* 8.7*  HCT 29.7*   < > 29.3*  28.3* 28.9*  MCV 85.8   < > 86.2 86.0 85.8  PLT 295   < > 299 258 257   < > = values in this interval not displayed.

## 2019-11-15 NOTE — Progress Notes (Signed)
PROGRESS NOTE  Zachary Young ZGY:[id number] DOB: Apr 07, 1961 DOA: 10/17/2019 PCP: System, Pcp Not In  HPI/Recap of past 24 hours:  [name] PMHx Post right below the knee amputation on 10/21/2019 by orthopedic surgery Dr. Sharol Given. Hospital course complicated by worsening renal function in the setting of progressive CKD V for which nephrology was consulted.  Has very poor insight into his medical condition.  His 2 sisters [name] and [name] are his medical decision makers.  Per his sister [name] via phone he is presently homeless.  He has difficulty understanding the severity of his medical condition.  Her along with her sister [name] are making medical decisions for him.    Patient is on parole and his sister [name] is working to lift it up in order to facilitate his placement to SNF.    Post right IJ TDC placement by IR on 11/05/2019.  1st HD completed on 11/06/2019.  2nd HD planned on 11/07/2019.  3rd HD completed on 11/10/2019.  Difficult placement, TOC assisting with SNF placement.     Assessment/Plan: Active Problems:   Hypertensive heart disease with CHF (congestive heart failure) (HCC)   Chronic kidney disease, stage 4 (severe) (HCC)   HLD (hyperlipidemia)   HTN (hypertension)   Systolic and diastolic CHF, chronic (HCC)   Tobacco use disorder   Type 2 diabetes mellitus with circulatory disorder, with long-term current use of insulin (HCC)   Wound of right foot   Syncope and collapse   Cocaine abuse ([place])   Subacute osteomyelitis, right ankle and foot (HCC)   Abscess of right foot   Diabetic neuropathy (HCC)   Anemia of chronic disease   Severe obesity (BMI 35.0-39.9) with comorbidity ([place])   Metabolic acidosis   DM (diabetes mellitus), secondary, uncontrolled, with complications (HCC)   Elevated sedimentation rate   Elevated C-reactive protein (CRP)   Hypoalbuminemia   Goals of care, counseling/discussion   Palliative care by specialist   Right foot wound status post BKA on  10/21/19 by Dr. Clarita Leber on the residual limb and had some bleeding on 7/21. Seen by ortho on 7/22, no wound dehiscence Ortho recommends to hold SQ heparin until the hematoma has resolved.  Will follow up with Ortho as an outpatient. PT has recommended SNF, difficult placement due to complicated social issues, on parole -resume heparin  Progressive CKD V, now ESRD  Presented with nonoliguric acute renal failure on chronic kidney disease stage IV, hyperkalemia, with evidence of early uremic symptoms.  Post right IJ TDC placement by IR Dr. Earleen Newport, on 11/05/2019 1st session of hemodialysis on 11/06/2019 2nd session of hemodialysis on 11/07/2019 3rd session 11/10/19 Will need HD spot prior to DC, TOC working on SNF placement Highly appreciate nephrology's assistance S/p Left brachiobasilic AV fistula creation  Transient intractable nausea and vomiting Suspect secondary to uremia in the setting of progressive CKD V  Continue hemodialysis  Diabetes mellitus type 2, uncontrolled with hyperglycemia and renal complications RFF6B 8.8.  Reduced doses of insulin to avoid hypoglycemia in the setting of new ESRD Lantus reduced to 10 U nightly NovoLog reduced to 4 units 3 times daily Continue with insulin sliding scale, sensitive.  Avoid hypoglycemia  Syncope, no recurrence Likely multifactorial including overdiuresis and acute kidney injury and acute infection.  No further episodes in the hospital.  Continue to monitor  Chronic diastolic CHF EF 50 to 84% based on echocardiogram done in May.  Volume status addressed with hemodialysis Continue to monitor daily weight  Hypotension, currently blood  pressures are soft Maintain MAP greater than 65 Continue to monitor vital signs  Anemia of chronic kidney disease/acute blood loss-bled from his stump, resolved. -trend  Acute blood loss anemia from bleeding stump 7/21, and 7/22. Seen by orthopedic surgery, Dr. Sharol Given, on 7/22,  recommended to hold off Lovenox until hematoma has resolved Hg as reported above Continue to monitor H&H  History of cocaine abuse His urine drug screen from 7/4 was positive for cocaine Cessation counseling done at bedside  Obesity .Body mass index is 35.35 kg/m.  Behavioral issues Patient has exhibited belligerent behavior with staff. He becomes verbally abusive. Appears to have very poor understanding of his multiple medical problems. -from prior days, his behavior has improved and is calmer  His sisters [name] and [name] are his medical decision makers.  Goals of care Seen by palliative care previously.  Palliative care team reconsulted to assist with establishing goals of care, appreciate assistance. His sisters [name] and [name] are his medical decision makers. DNR as of 11/03/2019   DVT Prophylaxis:heparin resumed Code Status:DNR Disposition Plan:Status is: Inpatient    Status is: Inpatient  Remains inpatient appropriate because:Inpatient level of care appropriate due to severity of illness   Dispo:             Patient From: Home             Planned Disposition: To be determined              Expected discharge date: unclear due to placement issues             Medically not stable for discharge due to now ESRD on hemodialysis requiring a stable disposition and an HD spot prior to discharge.    HPI Says he is NOT allergic to eggs-- would like this removed from his allergies   Objective: Vitals:   11/14/19 1245 11/14/19 1829 11/15/19 0515 11/15/19 1053  BP: 104/69 (!) 91/53 (!) 144/61 (!) 134/67  Pulse: 79 74 70 75  Resp: 16 16 18 18   Temp: 97.9 F (36.6 C) 97.8 F (36.6 C) 98.4 F (36.9 C) 98 F (36.7 C)  TempSrc: Oral Oral Oral Oral  SpO2: 100% 96% 100% 98%  Weight:      Height:        Intake/Output Summary (Last 24 hours) at 11/15/2019 1534 Last data filed at 11/14/2019 2150 Gross per 24 hour  Intake 240 ml  Output --  Net 240 ml    Filed Weights   11/13/19 0508 11/14/19 0730 11/14/19 1200  Weight: (!) 126.4 kg (!) 132.2 kg (!) 128.3 kg    In bed, NAD Right Stump wrapped with shrinker Alert, poor insight into medical issues  Data Reviewed: CBC: Recent Labs  Lab 11/09/19 0212 11/10/19 0434 11/12/19 0745 11/14/19 0730  WBC 7.7 8.1 6.9 10.3  NEUTROABS 4.0  --   --   --   HGB 9.1* 8.9* 8.4* 8.7*  HCT 29.7* 29.3* 28.3* 28.9*  MCV 85.8 86.2 86.0 85.8  PLT 295 299 258 751   Basic Metabolic Panel: Recent Labs  Lab 11/09/19 0212 11/09/19 0706 11/10/19 0434 11/11/19 0702 11/12/19 0745 11/14/19 0730  NA 138  --  140 139 138 134*  K 4.4  --  4.1 3.9 3.9 4.2  CL 105  --  107 105 104 98  CO2 24  --  24 24 25 24   GLUCOSE 328*  --  154* 122* 158* 274*  BUN 72*  --  70* 46*  54* 53*  CREATININE 5.14*  --  4.81* 3.84* 4.38* 4.73*  CALCIUM 8.7*  --  8.3* 8.2* 8.2* 8.3*  PHOS  --  3.4  --   --   --  4.0   GFR: Estimated Creatinine Clearance: 24.9 mL/min (A) (by C-G formula based on SCr of 4.73 mg/dL (H)). Liver Function Tests: Recent Labs  Lab 11/14/19 0730  ALBUMIN 2.6*   No results for input(s): LIPASE, AMYLASE in the last 168 hours. No results for input(s): AMMONIA in the last 168 hours. Coagulation Profile: No results for input(s): INR, PROTIME in the last 168 hours. Cardiac Enzymes: No results for input(s): CKTOTAL, CKMB, CKMBINDEX, TROPONINI in the last 168 hours. BNP (last 3 results) No results for input(s): PROBNP in the last 8760 hours. HbA1C: No results for input(s): HGBA1C in the last 72 hours. CBG: Recent Labs  Lab 11/14/19 1250 11/14/19 1601 11/14/19 2137 11/15/19 0608 11/15/19 1050  GLUCAP 195* 264* 293* 114* 198*   Lipid Profile: No results for input(s): CHOL, HDL, LDLCALC, TRIG, CHOLHDL, LDLDIRECT in the last 72 hours. Thyroid Function Tests: No results for input(s): TSH, T4TOTAL, FREET4, T3FREE, THYROIDAB in the last 72 hours. Anemia Panel: No results for input(s):  VITAMINB12, FOLATE, FERRITIN, TIBC, IRON, RETICCTPCT in the last 72 hours. Urine analysis:    Component Value Date/Time   COLORURINE YELLOW 08/15/2019 1538   APPEARANCEUR CLEAR 08/15/2019 1538   LABSPEC 1.010 08/15/2019 1538   PHURINE 5.0 08/15/2019 1538   GLUCOSEU NEGATIVE 08/15/2019 1538   HGBUR SMALL (A) 08/15/2019 1538   BILIRUBINUR NEGATIVE 08/15/2019 1538   KETONESUR NEGATIVE 08/15/2019 1538   PROTEINUR >=300 (A) 08/15/2019 1538   NITRITE NEGATIVE 08/15/2019 1538   LEUKOCYTESUR NEGATIVE 08/15/2019 1538     )No results found for this or any previous visit (from the past 240 hour(s)).    Studies: No results found.  Scheduled Meds: . aspirin EC  81 mg Oral Daily  . Chlorhexidine Gluconate Cloth  6 each Topical Q0600  . collagenase   Topical Daily  . docusate sodium  100 mg Oral BID  . gabapentin  200 mg Oral BID  . [START ON 11/16/2019] heparin injection (subcutaneous)  5,000 Units Subcutaneous Q8H  . insulin aspart  0-9 Units Subcutaneous TID WC  . insulin aspart  4 Units Subcutaneous TID WC  . insulin glargine  15 Units Subcutaneous QHS  . multivitamin with minerals  1 tablet Oral Daily  . nutrition supplement (JUVEN)  1 packet Oral BID BM  . prochlorperazine  10 mg Intravenous QHS  . Ensure Max Protein  11 oz Oral BID  . sodium chloride flush  3 mL Intravenous Q12H    Continuous Infusions: . sodium chloride 10 mL/hr at 11/13/19 0949  . ferric gluconate (FERRLECIT/NULECIT) IV 125 mg (11/14/19 1144)     LOS: 29 days     Geradine Girt, DO [facility]  If 7PM-7AM, please contact night-coverage  11/15/2019, 3:34 PM

## 2019-11-16 DIAGNOSIS — Z992 Dependence on renal dialysis: Secondary | ICD-10-CM

## 2019-11-16 DIAGNOSIS — Z89511 Acquired absence of right leg below knee: Secondary | ICD-10-CM

## 2019-11-16 DIAGNOSIS — Z87898 Personal history of other specified conditions: Secondary | ICD-10-CM

## 2019-11-16 DIAGNOSIS — N186 End stage renal disease: Secondary | ICD-10-CM

## 2019-11-16 LAB — GLUCOSE, CAPILLARY
Glucose-Capillary: 165 mg/dL — ABNORMAL HIGH (ref 70–99)
Glucose-Capillary: 116 mg/dL — ABNORMAL HIGH (ref 70–99)
Glucose-Capillary: 223 mg/dL — ABNORMAL HIGH (ref 70–99)
Glucose-Capillary: 241 mg/dL — ABNORMAL HIGH (ref 70–99)

## 2019-11-16 MED ORDER — CHLORHEXIDINE GLUCONATE CLOTH 2 % EX PADS
6.0000 | MEDICATED_PAD | Freq: Every day | CUTANEOUS | Status: DC
  Administered 2019-11-17 – 2019-11-25 (×7): 6 via TOPICAL

## 2019-11-16 MED ORDER — OXYCODONE-ACETAMINOPHEN 5-325 MG PO TABS
1.0000 | ORAL_TABLET | ORAL | Status: DC | PRN
  Administered 2019-11-16 – 2019-12-12 (×120): 1 via ORAL
  Filled 2019-11-16 (×122): qty 1

## 2019-11-16 MED ORDER — LIDOCAINE 5 % EX PTCH
1.0000 | MEDICATED_PATCH | CUTANEOUS | Status: DC
  Administered 2019-11-16 – 2019-12-12 (×17): 1 via TRANSDERMAL
  Filled 2019-11-16 (×20): qty 1

## 2019-11-16 MED ORDER — DARBEPOETIN ALFA 150 MCG/0.3ML IJ SOSY
150.0000 ug | PREFILLED_SYRINGE | INTRAMUSCULAR | Status: DC
  Filled 2019-11-16 (×3): qty 0.3

## 2019-11-16 MED ORDER — PREGABALIN 25 MG PO CAPS
50.0000 mg | ORAL_CAPSULE | Freq: Two times a day (BID) | ORAL | Status: DC
  Administered 2019-11-16 – 2019-12-12 (×52): 50 mg via ORAL
  Filled 2019-11-16 (×53): qty 2

## 2019-11-16 NOTE — Progress Notes (Signed)
Occupational Therapy Treatment Patient Details Name: Zachary Young MRN: [id number] DOB: August 06, 1960 Today's Date: 11/16/2019    History of present illness Pt is a 59 y/o male s/p R transtibial amputation. PMH including but not limited to CHF, DM, HTN, L transmet amputation. Pt now s/p HD catheter placement with plan for starting HD while admitted.    OT comments  Pt received in bed. Pt began talking about need for w/c at d/c, educated pt on need to participate with therapy to determine need for w/c. Pt became increasingly agitated and began yelling and cussing out therapist. Therapist educated pt on importance of participating with therapy to increase independence with self-care and functional mobility, educated pt on need for therapy to justify need for w/c pt stated "well if it's going to be like that I'll just buy my own", educated pt on 3x refusal will result in pt being d/c from OT services. Pt continued to become increasingly more agitated, therapist attempted to deescalate and redirect several times during session, but was unsuccessful and session was terminated. Will continue to follow acutely.    Follow Up Recommendations  SNF;Supervision/Assistance - 24 hour    Equipment Recommendations  3 in 1 bedside commode    Recommendations for Other Services      Precautions / Restrictions Precautions Precautions: Fall Precaution Comments: R limb protector Required Braces or Orthoses: Other Brace Other Brace: R limb protector Restrictions Weight Bearing Restrictions: Yes RLE Weight Bearing: Non weight bearing Other Position/Activity Restrictions: NWB R LE       Mobility Bed Mobility Overal bed mobility: Modified Independent             General bed mobility comments: pt progressed to supine<>sit EOB with HOB elevated and use of bed rails  Transfers                 General transfer comment: deferred    Balance Overall balance assessment: Needs  assistance Sitting-balance support: No upper extremity supported;Feet supported Sitting balance-Leahy Scale: Fair Sitting balance - Comments: pt sitting EOB without UE support                                   ADL either performed or assessed with clinical judgement   ADL Overall ADL's : Needs assistance/impaired Eating/Feeding: Modified independent;Bed level Eating/Feeding Details (indicate cue type and reason): provided pt with lunch tray at end of session                                   General ADL Comments: limited assessment due to agitation     Vision       Perception     Praxis      Cognition Arousal/Alertness: Awake/alert Behavior During Therapy: Agitated Overall Cognitive Status: Impaired/Different from baseline Area of Impairment: Safety/judgement;Problem solving                         Safety/Judgement: Decreased awareness of safety;Decreased awareness of deficits   Problem Solving: Requires verbal cues;Requires tactile cues General Comments: pt declining mobility at this time stating he "just transferred back from recliner" has been up to the sink to brush his teeth, has been on the [facility], etc; pt with poor understanding of importance of participation with therapy, pt requesting w/c for d/c educated pt that for insurance coverage  he will need to participate with therapy and demonstrate ability to transfer to/from w/c to determine necessity;pt became agitated and began yelling and cussing out therapist, attempted to de-escalate pt but was unsuccessful;educated pt on 3x refusal rule and that he will be discharged from OT caseload, pt continued to yell at therapist, so session was terminated        Exercises     Shoulder Instructions       General Comments noted increased edema in RLE, limited assessment due to pt agitation    Pertinent Vitals/ Pain       Pain Assessment: No/denies pain  Home Living                                           Prior Functioning/Environment              Frequency  Min 2X/week        Progress Toward Goals  OT Goals(current goals can now be found in the care plan section)  Progress towards OT goals: Not progressing toward goals - comment (limited by agitation and declining therapy)  Acute Rehab OT Goals Patient Stated Goal: to go home OT Goal Formulation: With patient Time For Goal Achievement: 11/30/19 Potential to Achieve Goals: Fair ADL Goals Pt Will Perform Lower Body Dressing: with modified independence;with adaptive equipment;sitting/lateral leans Pt Will Transfer to Toilet: stand pivot transfer;with min assist Pt Will Perform Tub/Shower Transfer: Tub transfer;tub bench;with min assist  Plan Discharge plan remains appropriate    Co-evaluation                 AM-PAC OT "6 Clicks" Daily Activity     Outcome Measure   Help from another person eating meals?: A Little Help from another person taking care of personal grooming?: A Little Help from another person toileting, which includes using toliet, bedpan, or urinal?: A Lot Help from another person bathing (including washing, rinsing, drying)?: A Lot Help from another person to put on and taking off regular upper body clothing?: A Lot Help from another person to put on and taking off regular lower body clothing?: A Lot 6 Click Score: 14    End of Session Equipment Utilized During Treatment: Other (comment) (limb protector)  OT Visit Diagnosis: Other abnormalities of gait and mobility (R26.89);Unsteadiness on feet (R26.81);Muscle weakness (generalized) (M62.81);Other symptoms and signs involving cognitive function;Pain Pain - Right/Left: Right Pain - part of body: Leg   Activity Tolerance Treatment limited secondary to agitation   Patient Left in bed;with call bell/phone within reach   Nurse Communication Mobility status;Other (comment) (agitation)        Time:  4801-6553 OT Time Calculation (min): 10 min  Charges: OT General Charges $OT Visit: 1 Visit OT Treatments $Self Care/Home Management : 8-22 mins  Helene Kelp OTR/L Acute Rehabilitation Services Office: [place] 11/16/2019, 11:49 AM

## 2019-11-16 NOTE — Progress Notes (Signed)
[facility] PROGRESS NOTE  Yonas Bunda ERX:[id number] DOB: 30-Aug-1960 DOA: 10/17/2019 PCP: System, Pcp Not In  Status: Inpatient Remains inpatient appropriate because:Unsafe d/c plan  Dispo:  Patient From: Home  Planned Disposition: [place]  Expected discharge date: 12/11/19  Medically stable for discharge: Yes-disposition limited by inability to procure appropriate housing for patient.  Patient is a known sex offender and is listed in the state database.  This has severely limited his discharge options.  Due to the sex offender status we have been unable to obtain any communal living such as in a skilled nursing facility or homeless shelter.  Because of the status he has been unable to live with several family members due to close proximity of the residences to schools or churches.  Also because of his underlying psychiatric issues many family members who have excepted him in the past refused to take him back.  Code Status: DNR Family Communication: Patient and sister at bedside; lengthy discussion with sister.  She tells me that she is working with the patient's parole officer regarding an attempt to expunge access under status from patient's record.  I have received different information from case management who states they have previously spoken to parole officer who states unfortunately sex offender status cannot be expunged. DVT prophylaxis: Subcutaneous heparin Vaccination status: Unknown  HPI: [name], Post right below the knee amputation on 10/21/2019 by orthopedic surgery Dr. Sharol Given. Hospital course complicated by worsening renal function in the setting of progressive CKD V for which nephrology was consulted.  Has very poor insight into his medical condition.  His 2 sisters [name] and [name] are his medical decision makers.  Per his sister [name] via phone he is presently homeless. He has difficulty understanding the severity of his medical condition. Her along  with her sister [name] are making medical decisions for him.   Patient is on parole and has a felony conviction is a sex offender limits his discharge options especially regarding homeless shelter or skilled nursing facility placement.  His Sister [name] is attempting to have his parole and or sex offender status expunged but unfortunately this apparently is not legally an option.  . Subjective: Alert.  Initial complaints were related to requesting specific items for his tray that he has been unable to get due to restricted diet in relation to renal, cardiac and diabetic restrictions. Later during wound care he complained of significant distal stump discomfort and reports mental limb pain primarily big toe "itching"  Objective: Vitals:   11/16/19 0551 11/16/19 1212  BP: (!) 144/69 136/72  Pulse: 79 76  Resp: 18 18  Temp: 98 F (36.7 C) 97.8 F (36.6 C)  SpO2: 97% 96%    Intake/Output Summary (Last 24 hours) at 11/16/2019 1323 Last data filed at 11/16/2019 1223 Gross per 24 hour  Intake 878 ml  Output 1300 ml  Net -422 ml   Filed Weights   11/13/19 0508 11/14/19 0730 11/14/19 1200  Weight: (!) 126.4 kg (!) 132.2 kg (!) 128.3 kg    Exam:  Constitutional: NAD, initially somewhat agitated when I entered room understanding my role but once I discussed with patient who I was and what I was doing he became calm, uncomfortable during wound care Respiratory: clear to auscultation bilaterally, no wheezing, no crackles. Normal respiratory effort. No accessory muscle use.  Cardiovascular: Regular rate and rhythm, no murmurs / rubs / gallops. No extremity edema. 2+ pedal pulse on left Abdomen: no tenderness, no masses palpated. No  hepatosplenomegaly. Bowel sounds positive.  Musculoskeletal: no clubbing / cyanosis. No joint deformity upper and lower extremities. Good ROM, no contractures. Normal muscle tone.  Right BKA Skin: no rashes, lesions, ulcers. No induration-staples intact on right BKA  with some serosanguineous drainage dried on dressing. Neurologic: CN 2-12 grossly intact. Sensation intact with hyperesthesia noted at BKA site, DTR normal. Strength 5/5 x all 4 extremities.  Psychiatric: Normal judgment and insight. Alert and oriented x 3. Normal mood.    Assessment/Plan:  Right foot wound/osteomyelitis s/p BKA on 10/21/19 by Dr. Clarita Leber on the residual limb and had some bleeding on 7/21. Seen by ortho on 7/22, no wound dehiscence At recommendation of orthopedic team SQ heparin was held until the hematoma  resolved has subsequently been resumed.   Will need to follow up with Ortho as an outpatient. PT has recommended SNF, difficult placement due to complicated social issues 2/2 sex offender status Having postoperative pain as well as phantom limb pain.  Gabapentin appears ineffective so have transitioned to Lyrica 50 mg twice daily and will increase IR to 5 mg every 4 hours as needed; will also have staff apply lidocaine patch to distal aspect of right BKA stump.  Progressive CKD V, now ESRD  Presented with nonoliguric acute renal failure on chronic kidney disease stage IV, hyperkalemia, with evidence of early uremic symptoms.  Post right IJ TDC placement by IR Dr. Earleen Newport, on 11/05/2019 1st session of hemodialysis on 11/06/2019 2nd session of hemodialysis on 11/07/2019 3rd session 11/10/19 and tolerating well Will need HD spot prior to DC, TOC working on SNF placement with limitations as described above-CLIP in progress by renal team but difficult to arrange given lack of discharge options Highly appreciate nephrology's assistance S/p Left brachiobasilic AV fistula creation on 7/30  Transient intractable nausea and vomiting Suspect secondary to uremia in the setting of progressive CKD V  Continue hemodialysis Currently stable without symptoms and requesting specific items to eat  Diabetes mellitus type 2, uncontrolled with hyperglycemia and renal complications PZW2H  8.8.  Reduced Lantus to 10 units at bedtime to avoid hypoglycemia in the setting of new ESRD and NovoLog reduced to 4 units 3 times daily Continue with insulin sliding scale, sensitive.  Avoid hypoglycemia  Syncope, no recurrence Likely multifactorial including overdiuresis and acute kidney injury and acute infection.  No further episodes in the hospital. Continue to monitor  Chronic diastolic CHF EF 50 to 85% based on echocardiogram done in May.  Volume status addressed with hemodialysis Continue to monitor daily weight  Hypotension, currently blood pressures are soft Maintain MAP greater than 65 Continue to monitor vital signs  Anemia of chronic kidney disease/acute blood loss-bled from his stump, resolved. -trend  Acute blood loss anemia from bleeding stump 7/21, and 7/22. Seen by orthopedic surgery, Dr. Sharol Given, on 7/22, recommended to hold off Lovenox until hematoma has resolved Hg as reported above Continue to monitor H&H  History of cocaine abuse His urine drug screen from 7/4 was positive for cocaine Cessation counseling done at bedside  Obesity .Body mass index is 35.35 kg/m.  Behavioral issues/known sex offender Patient has exhibited belligerent behavior with staff. He becomes verbally abusive. Appears to have very poor understanding of his multiple medical problems. He was evaluated by psychiatry on 7/9 who documented that patient does not have capacity currently to make medical decisions including disposition however capacity to make medical decisions may change from time to time.  He was documented as having limited understanding of his  current condition and illness and did not understand the reasoning for being hospitalized currently.  He did not meet criteria for inpatient admission and was not imminent risk to self or others.  No medication recommendations offered. In addition to agitated and belligerent behavior it has been reported to this Probation officer  that he is also had inappropriate and at times disturbing sexual conversations with the male staff As of my interaction with patient again he was easily agitated this morning in regards to lack of variety in diet especially regarding foods he prefers to eat.  Discussed with patient that given his new requirement for dialysis that we must make adjustments in diet but I did allow him to have at least one slice tomato a day, 1 packet of light mayonnaise with meals and 2 eggs daily.  Apparently had faulty documentation of egg allergy in medical record. His sisters [name] and [name] are his medical decision makers.  Goals of care Seen by palliative care previously.  Palliative care team reconsulted to assist with establishing goals of care, appreciate assistance. His sisters [name] and [name] are his medical decision makers. DNR as of 11/03/2019    Data Reviewed: Basic Metabolic Panel: Recent Labs  Lab 11/10/19 0434 11/11/19 0702 11/12/19 0745 11/14/19 0730  NA 140 139 138 134*  K 4.1 3.9 3.9 4.2  CL 107 105 104 98  CO2 24 24 25 24   GLUCOSE 154* 122* 158* 274*  BUN 70* 46* 54* 53*  CREATININE 4.81* 3.84* 4.38* 4.73*  CALCIUM 8.3* 8.2* 8.2* 8.3*  PHOS  --   --   --  4.0   Liver Function Tests: Recent Labs  Lab 11/14/19 0730  ALBUMIN 2.6*   No results for input(s): LIPASE, AMYLASE in the last 168 hours. No results for input(s): AMMONIA in the last 168 hours. CBC: Recent Labs  Lab 11/10/19 0434 11/12/19 0745 11/14/19 0730  WBC 8.1 6.9 10.3  HGB 8.9* 8.4* 8.7*  HCT 29.3* 28.3* 28.9*  MCV 86.2 86.0 85.8  PLT 299 258 257   Cardiac Enzymes: No results for input(s): CKTOTAL, CKMB, CKMBINDEX, TROPONINI in the last 168 hours. BNP (last 3 results) Recent Labs    08/15/19 0944 08/30/19 2152  BNP 148.2* 48.1    ProBNP (last 3 results) No results for input(s): PROBNP in the last 8760 hours.  CBG: Recent Labs  Lab 11/15/19 1050 11/15/19 1634 11/15/19 2143 11/16/19 0636  11/16/19 1118  GLUCAP 198* 182* 228* 165* 116*    No results found for this or any previous visit (from the past 240 hour(s)).   Studies: No results found.  Scheduled Meds: . aspirin EC  81 mg Oral Daily  . Chlorhexidine Gluconate Cloth  6 each Topical Q0600  . collagenase   Topical Daily  . [START ON 11/17/2019] darbepoetin (ARANESP) injection - DIALYSIS  150 mcg Intravenous Q Tue-HD  . docusate sodium  100 mg Oral BID  . heparin injection (subcutaneous)  5,000 Units Subcutaneous Q8H  . insulin aspart  0-9 Units Subcutaneous TID WC  . insulin aspart  4 Units Subcutaneous TID WC  . insulin glargine  15 Units Subcutaneous QHS  . lidocaine  1 patch Transdermal Q24H  . multivitamin with minerals  1 tablet Oral Daily  . nutrition supplement (JUVEN)  1 packet Oral BID BM  . pregabalin  50 mg Oral BID  . prochlorperazine  10 mg Intravenous QHS  . Ensure Max Protein  11 oz Oral BID  . sodium chloride flush  3 mL Intravenous Q12H   Continuous Infusions: . sodium chloride 10 mL/hr at 11/13/19 0949  . ferric gluconate (FERRLECIT/NULECIT) IV 125 mg (11/14/19 1144)    Active Problems:   Hypertensive heart disease with CHF (congestive heart failure) (HCC)   Chronic kidney disease, stage 4 (severe) (HCC)   HLD (hyperlipidemia)   HTN (hypertension)   Systolic and diastolic CHF, chronic (HCC)   Tobacco use disorder   Type 2 diabetes mellitus with circulatory disorder, with long-term current use of insulin (HCC)   Wound of right foot   Syncope and collapse   Cocaine abuse ([place])   Subacute osteomyelitis, right ankle and foot (HCC)   Abscess of right foot   Diabetic neuropathy (HCC)   Anemia of chronic disease   Severe obesity (BMI 35.0-39.9) with comorbidity ([place])   Metabolic acidosis   DM (diabetes mellitus), secondary, uncontrolled, with complications (HCC)   Elevated sedimentation rate   Elevated C-reactive protein (CRP)   Hypoalbuminemia   Goals of care, counseling/discussion    Palliative care by specialist   Consultants:  Orthopedic  Vascular surgery  Nephrology  Palliative medicine  Psychiatry  Interventional radiology  Procedures: Right BKA on 7/7 Post right IJ TDC placement by IR on 11/05/2019.   Creation of left AV fistula on 11/13/2019 2D echocardiogram 08/16/2018  Antibiotics: Anti-infectives (From admission, onward)   Start     Dose/Rate Route Frequency Ordered Stop   11/13/19 0600  ceFAZolin (ANCEF) IVPB 2g/100 mL premix        2 g 200 mL/hr over 30 Minutes Intravenous To Short Stay 11/12/19 0735 11/13/19 1003   11/12/19 0000  ceFAZolin (ANCEF) IVPB 2g/100 mL premix  Status:  Discontinued       Note to Pharmacy: Send with pt to OR   2 g 200 mL/hr over 30 Minutes Intravenous On call 11/11/19 0634 11/11/19 0636   11/05/19 1230  ceFAZolin (ANCEF) IVPB 2g/100 mL premix        2 g 200 mL/hr over 30 Minutes Intravenous To Radiology 11/05/19 1154 11/05/19 1750   10/21/19 0600  ceFAZolin (ANCEF) 3 g in dextrose 5 % 50 mL IVPB        3 g 100 mL/hr over 30 Minutes Intravenous On call to O.R. 10/20/19 1810 10/21/19 1013   10/20/19 1000  vancomycin (VANCOREADY) IVPB 1250 mg/250 mL        1,250 mg 166.7 mL/hr over 90 Minutes Intravenous  Once 10/20/19 0930 10/20/19 1143   10/17/19 2115  cefTRIAXone (ROCEPHIN) 2 g in sodium chloride 0.9 % 100 mL IVPB  Status:  Discontinued        2 g 200 mL/hr over 30 Minutes Intravenous Every 24 hours 10/17/19 2112 10/22/19 1603   10/17/19 2115  metroNIDAZOLE (FLAGYL) IVPB 500 mg  Status:  Discontinued        500 mg 100 mL/hr over 60 Minutes Intravenous Every 8 hours 10/17/19 2112 10/22/19 1603   10/17/19 2115  vancomycin (VANCOCIN) 2,250 mg in sodium chloride 0.9 % 500 mL IVPB        2,250 mg 250 mL/hr over 120 Minutes Intravenous  Once 10/17/19 2110 10/18/19 0209   10/17/19 2110  vancomycin variable dose per unstable renal function (pharmacist dosing)  Status:  Discontinued         Does not apply See admin  instructions 10/17/19 2110 10/22/19 1603        Time spent: [place] ANP  [facility] Pager [phone]. If  7PM-7AM, please contact night-coverage at [email or web address] 11/16/2019, 1:23 PM  LOS: 30 days

## 2019-11-16 NOTE — Progress Notes (Signed)
Admit: 10/17/2019 LOS: 30  61M new ESRD, several social barriers  Subjective:   NAEO  HD Saturday 2.6L UF  Says he feels good-  Likes dialysis  08/01 0701 - 08/02 0700 In: 1425 [P.O.:1425] Out: 800 [Urine:800]  Filed Weights   11/13/19 0508 11/14/19 0730 11/14/19 1200  Weight: (!) 126.4 kg (!) 132.2 kg (!) 128.3 kg    Scheduled Meds: . aspirin EC  81 mg Oral Daily  . Chlorhexidine Gluconate Cloth  6 each Topical Q0600  . collagenase   Topical Daily  . docusate sodium  100 mg Oral BID  . heparin injection (subcutaneous)  5,000 Units Subcutaneous Q8H  . insulin aspart  0-9 Units Subcutaneous TID WC  . insulin aspart  4 Units Subcutaneous TID WC  . insulin glargine  15 Units Subcutaneous QHS  . lidocaine  1 patch Transdermal Q24H  . multivitamin with minerals  1 tablet Oral Daily  . nutrition supplement (JUVEN)  1 packet Oral BID BM  . pregabalin  50 mg Oral BID  . prochlorperazine  10 mg Intravenous QHS  . Ensure Max Protein  11 oz Oral BID  . sodium chloride flush  3 mL Intravenous Q12H   Continuous Infusions: . sodium chloride 10 mL/hr at 11/13/19 0949  . ferric gluconate (FERRLECIT/NULECIT) IV 125 mg (11/14/19 1144)   PRN Meds:.albuterol, HYDROmorphone (DILAUDID) injection, hydrOXYzine, ondansetron **OR** ondansetron (ZOFRAN) IV, oxyCODONE-acetaminophen, prochlorperazine  Current Labs: reviewed    Physical Exam:  Blood pressure (!) 144/69, pulse 79, temperature 98 F (36.7 C), temperature source Oral, resp. rate 18, height 6\' 3"  (1.905 m), weight (!) 128.3 kg, SpO2 97 %. NAD R IJ TDC C/D/I RRR CTAB RLE amputation, LLE min edema LUE BBF +B/T  A 1. New ESRD started 7/23 via [place] 1. CLIP in process, dep't upon SNF, could be difficult 2. THS schedule 3. R IJ TDC, s/p L BBF AVF 11/13/19 2. Metabolic Acidosis, resolved 3. Hyperkalemia, resolved 4. Homeless 5. Cocaine use 6. DM2 7. Anemia, on IV Fe, ESA, Hb stable 8. CKD-BMD: Ca and P ok-  PTH 22- no meds   9. HTN, BPs ok, hypervolemic  P . HD on THS schedule: 4h, 3-4L UF, SBP > 105, No heparin, 3K, 400/800 . CLIP / SNF in process   Louis Meckel  11/16/2019, 10:37 AM  Recent Labs  Lab 11/11/19 0702 11/12/19 0745 11/14/19 0730  NA 139 138 134*  K 3.9 3.9 4.2  CL 105 104 98  CO2 24 25 24   GLUCOSE 122* 158* 274*  BUN 46* 54* 53*  CREATININE 3.84* 4.38* 4.73*  CALCIUM 8.2* 8.2* 8.3*  PHOS  --   --  4.0   Recent Labs  Lab 11/10/19 0434 11/12/19 0745 11/14/19 0730  WBC 8.1 6.9 10.3  HGB 8.9* 8.4* 8.7*  HCT 29.3* 28.3* 28.9*  MCV 86.2 86.0 85.8  PLT 299 258 257

## 2019-11-16 NOTE — Progress Notes (Signed)
Daily Progress Note   Patient Name: Zachary Young       Date: 11/16/2019 DOB: 04-08-1961  Age: 59 y.o. MRN#: [id number] Attending Physician: Geradine Girt, DO Primary Care Physician: System, Pcp Not In Admit Date: 10/17/2019  HPI/Patient Profile:59 y.o.malewith past medical history of CKD V, poorly controlled T2DM, diabetic neuropathy, CHF, HTN, and substance abuseadmitted on 7/3/2021with syncopal episode.Found to have a new wound on plantar surface of right foot. Patient had Right BKA on 7/7. PMT consulted for [place].  Subjective: Patient is sitting up in bed, eating dinner. No complaints. I asked him how he was doing with dialysis, and her states "ok".    Length of Stay: 30   Physical Exam Vitals reviewed.  Constitutional:      General: He is not in acute distress.    Appearance: He is obese.  Pulmonary:     Effort: Pulmonary effort is normal.  Musculoskeletal:     Right Lower Extremity: Right leg is amputated below knee.  Neurological:     Mental Status: He is alert and oriented to person, place, and time.             Vital Signs: BP (!) 144/69 (BP Location: Right Arm)   Pulse 79   Temp 98 F (36.7 C) (Oral)   Resp 18   Ht 6\' 3"  (1.905 m)   Wt (!) 128.3 kg   SpO2 97%   BMI 35.35 kg/m  SpO2: SpO2: 97 % O2 Device: O2 Device: Room Air   LBM: Last BM Date: 11/09/19 Baseline Weight: Weight: 135.4 kg Most recent weight: Weight:  (Patient refused to be weigh.Told the Nurse YR.)       Palliative Assessment/Data: 50%     Patient Active Problem List   Diagnosis Date Noted  . Goals of care, counseling/discussion   . Palliative care by specialist   . Abscess of right foot 10/21/2019  . Diabetic neuropathy ([place]) 10/21/2019  . Anemia of chronic disease 10/21/2019    . Severe obesity (BMI 35.0-39.9) with comorbidity ([place]) 10/21/2019  . Metabolic acidosis 78/67/6720  . DM (diabetes mellitus), secondary, uncontrolled, with complications ([place]) 94/70/9628  . Elevated sedimentation rate 10/21/2019  . Elevated C-reactive protein (CRP) 10/21/2019  . Hypoalbuminemia 10/21/2019  . Subacute osteomyelitis, right ankle and foot ([place])   . Cocaine abuse (  [place]) 10/19/2019  . Wound of right foot 10/17/2019  . Acute kidney injury superimposed on chronic kidney disease ([place])   . Syncope and collapse   . Dyspnea   . Abdominal pain   . Hypertensive heart disease with CHF (congestive heart failure) ([place]) 08/15/2019  . Acute hypoxemic respiratory failure ([place])   . Anasarca 06/17/2019  . Gastritis 05/26/2019  . GI bleed 12/14/2018  . Chronic kidney disease, stage 4 (severe) ([place]) 06/26/2018  . Systolic and diastolic CHF, chronic ([place]) 06/26/2018  . Moderate nonproliferative retinopathy due to secondary diabetes ([place]) 04/30/2018  . HLD (hyperlipidemia) 02/28/2018  . History of MI (myocardial infarction) 05/22/2016  . History of osteomyelitis 05/22/2016  . Tobacco use disorder 03/27/2016  . HTN (hypertension) 03/26/2016  . Type 2 diabetes mellitus with circulatory disorder, with long-term current use of insulin ([place]) 03/26/2016  . Toe amputation status 03/17/2014    Palliative Care Assessment & Plan   Assessment: 59 year old male with history of poorly controlled diabetes, HTN, CHF, and CKD. Also with a history of substance abuse and non-compliance. Currently homeless and on parole. He has progressed to ESRD and requiring regular dialysis since 11/06/19.   Recommendations/Plan: - patient has limited capacity to make his own decisions, sisters [name] and Webb Silversmith should be involved in any major medical decisions for this patient - patient is willing to continue dialysis for now - No additional PMT needs at this time, please call [phone] if we can be of additional  assistance or need to actively re-engage with this patient and family  Code Status: DNR/DNI  Prognosis:   Unable to determine  Discharge Planning:  To Be Determined   Thank you for allowing the Palliative Medicine Team to assist in the care of this patient.   Total Time 15 minutes Prolonged Time Billed  no       Greater than 50%  of this time was spent counseling and coordinating care related to the above assessment and plan.  Lavena Bullion, NP  Please contact Palliative Medicine Team phone at [phone] for questions and concerns.

## 2019-11-17 DIAGNOSIS — G546 Phantom limb syndrome with pain: Secondary | ICD-10-CM

## 2019-11-17 LAB — CBC
HCT: 29.9 % — ABNORMAL LOW (ref 39.0–52.0)
Hemoglobin: 9.1 g/dL — ABNORMAL LOW (ref 13.0–17.0)
MCH: 25.7 pg — ABNORMAL LOW (ref 26.0–34.0)
MCHC: 30.4 g/dL (ref 30.0–36.0)
MCV: 84.5 fL (ref 80.0–100.0)
Platelets: 232 10*3/uL (ref 150–400)
RBC: 3.54 MIL/uL — ABNORMAL LOW (ref 4.22–5.81)
RDW: 15.5 % (ref 11.5–15.5)
WBC: 7.4 10*3/uL (ref 4.0–10.5)
nRBC: 0 % (ref 0.0–0.2)

## 2019-11-17 LAB — GLUCOSE, CAPILLARY
Glucose-Capillary: 136 mg/dL — ABNORMAL HIGH (ref 70–99)
Glucose-Capillary: 136 mg/dL — ABNORMAL HIGH (ref 70–99)
Glucose-Capillary: 121 mg/dL — ABNORMAL HIGH (ref 70–99)
Glucose-Capillary: 154 mg/dL — ABNORMAL HIGH (ref 70–99)
Glucose-Capillary: 137 mg/dL — ABNORMAL HIGH (ref 70–99)

## 2019-11-17 LAB — RENAL FUNCTION PANEL
Albumin: 2.7 g/dL — ABNORMAL LOW (ref 3.5–5.0)
Anion gap: 8 (ref 5–15)
BUN: 55 mg/dL — ABNORMAL HIGH (ref 6–20)
CO2: 25 mmol/L (ref 22–32)
Calcium: 9.4 mg/dL (ref 8.9–10.3)
Chloride: 101 mmol/L (ref 98–111)
Creatinine, Ser: 4.14 mg/dL — ABNORMAL HIGH (ref 0.61–1.24)
GFR calc Af Amer: 17 mL/min — ABNORMAL LOW (ref >60)
GFR calc non Af Amer: 15 mL/min — ABNORMAL LOW (ref >60)
Glucose, Bld: 136 mg/dL — ABNORMAL HIGH (ref 70–99)
Phosphorus: 4.2 mg/dL (ref 2.5–4.6)
Potassium: 4.4 mmol/L (ref 3.5–5.1)
Sodium: 134 mmol/L — ABNORMAL LOW (ref 135–145)

## 2019-11-17 MED ORDER — DARBEPOETIN ALFA 150 MCG/0.3ML IJ SOSY
PREFILLED_SYRINGE | INTRAMUSCULAR | Status: AC
  Administered 2019-11-17: 150 ug
  Filled 2019-11-17: qty 0.3

## 2019-11-17 MED ORDER — HEPARIN SODIUM (PORCINE) 1000 UNIT/ML IJ SOLN
INTRAMUSCULAR | Status: AC
  Administered 2019-11-17: 1000 [IU]
  Filled 2019-11-17: qty 4
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
## 2019-11-17 NOTE — Progress Notes (Signed)
PT Cancellation Note  Patient Details Name: Zachary Young MRN: [id number] DOB: [birth year]   Cancelled Treatment:    Reason Eval/Treat Not Completed: Other (comment) Pt at HD this morning and declined PT in afternoon due to fatigue from HD. Request PT on non-HD days (noted on PT schedule) Abran Richard, PT Acute Rehab Services Pager [phone] [facility] [place] 11/17/2019, 2:10 PM

## 2019-11-17 NOTE — Progress Notes (Signed)
Patient refusing bed alarm to be set.

## 2019-11-17 NOTE — Progress Notes (Signed)
Patient in dialysis.

## 2019-11-17 NOTE — Progress Notes (Signed)
Report given to HD nurse. Transport here to transport pt to HD.

## 2019-11-17 NOTE — Procedures (Signed)
Patient was seen on dialysis and the procedure was supervised.  BFR 350  Via TDC BP is 84/56.   Patient appears to be tolerating treatment well-  No sxms with low BP   Louis Meckel 11/17/2019

## 2019-11-17 NOTE — Progress Notes (Signed)
Subjective:  Seen on HD-  No c/o's just frustrated about his situation-  Is open to going to other cities  Objective Vital signs in last 24 hours: Vitals:   11/17/19 0800 11/17/19 0805 11/17/19 0830 11/17/19 0900  BP: (!) 80/27 (!) 89/52 (!) 104/50 (!) 82/54  Pulse: 64 85 84 86  Resp:      Temp:      TempSrc:      SpO2:      Weight:      Height:       Weight change:   Intake/Output Summary (Last 24 hours) at 11/17/2019 0929 Last data filed at 11/17/2019 0317 Gross per 24 hour  Intake 723 ml  Output 500 ml  Net 223 ml    Assessment/ Plan: Pt is a 59 y.o. yo male who was admitted on 10/17/2019 with new start to dialysis due to ESRD  Assessment/Plan: 1. New ESRD-  Has started HD on 7/23.   Has TDC and AVF placed 7/30.  Currently on TTS schedule- CLIP in process but disposition is unknown at this time- many social issues 2. HTN/volume-  BP good/even low no BP meds  3. Anemia- climbing slowly -  darbe 150 and iron  4. Secondary hyperparathyroidism- calcium/phos OK- no meds- PTH 22- can probably loosen diet a little - this might make him happy    Louis Meckel    Labs: Basic Metabolic Panel: Recent Labs  Lab 11/12/19 0745 11/14/19 0730 11/17/19 0727  NA 138 134* 134*  K 3.9 4.2 4.4  CL 104 98 101  CO2 25 24 25   GLUCOSE 158* 274* 136*  BUN 54* 53* 55*  CREATININE 4.38* 4.73* 4.14*  CALCIUM 8.2* 8.3* 9.4  PHOS  --  4.0 4.2   Liver Function Tests: Recent Labs  Lab 11/14/19 0730 11/17/19 0727  ALBUMIN 2.6* 2.7*   No results for input(s): LIPASE, AMYLASE in the last 168 hours. No results for input(s): AMMONIA in the last 168 hours. CBC: Recent Labs  Lab 11/12/19 0745 11/14/19 0730 11/17/19 0727  WBC 6.9 10.3 7.4  HGB 8.4* 8.7* 9.1*  HCT 28.3* 28.9* 29.9*  MCV 86.0 85.8 84.5  PLT 258 257 232   Cardiac Enzymes: No results for input(s): CKTOTAL, CKMB, CKMBINDEX, TROPONINI in the last 168 hours. CBG: Recent Labs  Lab 11/16/19 0636 11/16/19 1118  11/16/19 1605 11/16/19 2114 11/17/19 0631  GLUCAP 165* 116* 223* 241* 136*    Iron Studies: No results for input(s): IRON, TIBC, TRANSFERRIN, FERRITIN in the last 72 hours. Studies/Results: No results found. Medications: Infusions: . sodium chloride 10 mL/hr at 11/13/19 0949  . ferric gluconate (FERRLECIT/NULECIT) IV 125 mg (11/14/19 1144)    Scheduled Medications: . aspirin EC  81 mg Oral Daily  . Chlorhexidine Gluconate Cloth  6 each Topical Q0600  . Chlorhexidine Gluconate Cloth  6 each Topical Q0600  . collagenase   Topical Daily  . Darbepoetin Alfa      . darbepoetin (ARANESP) injection - DIALYSIS  150 mcg Intravenous Q Tue-HD  . docusate sodium  100 mg Oral BID  . heparin injection (subcutaneous)  5,000 Units Subcutaneous Q8H  . insulin aspart  0-9 Units Subcutaneous TID WC  . insulin aspart  4 Units Subcutaneous TID WC  . insulin glargine  15 Units Subcutaneous QHS  . lidocaine  1 patch Transdermal Q24H  . multivitamin with minerals  1 tablet Oral Daily  . nutrition supplement (JUVEN)  1 packet Oral BID BM  .  pregabalin  50 mg Oral BID  . prochlorperazine  10 mg Intravenous QHS  . Ensure Max Protein  11 oz Oral BID  . sodium chloride flush  3 mL Intravenous Q12H    have reviewed scheduled and prn medications.  Physical Exam: General: resting in HD-  Mostly calm Heart: RRR Lungs: mostly clear Abdomen: soft, non tender Extremities: min edema Dialysis Access: TDC plus AVF on left-  Good thrill     11/17/2019,9:29 AM  LOS: 31 days

## 2019-11-17 NOTE — Progress Notes (Signed)
MEWs is yellow, patient in dialysis.

## 2019-11-18 LAB — GLUCOSE, CAPILLARY
Glucose-Capillary: 118 mg/dL — ABNORMAL HIGH (ref 70–99)
Glucose-Capillary: 138 mg/dL — ABNORMAL HIGH (ref 70–99)
Glucose-Capillary: 131 mg/dL — ABNORMAL HIGH (ref 70–99)
Glucose-Capillary: 142 mg/dL — ABNORMAL HIGH (ref 70–99)

## 2019-11-18 MED ORDER — HYDROCERIN EX CREA
TOPICAL_CREAM | Freq: Two times a day (BID) | CUTANEOUS | Status: DC
  Filled 2019-11-18 (×2): qty 113

## 2019-11-18 MED ORDER — WHITE PETROLATUM EX OINT
TOPICAL_OINTMENT | CUTANEOUS | Status: DC | PRN
  Filled 2019-11-18: qty 28.35

## 2019-11-18 MED ORDER — CHLORHEXIDINE GLUCONATE CLOTH 2 % EX PADS
6.0000 | MEDICATED_PAD | Freq: Every day | CUTANEOUS | Status: DC
  Administered 2019-11-19 – 2019-12-05 (×13): 6 via TOPICAL

## 2019-11-18 MED ORDER — SODIUM CHLORIDE 0.9 % IV SOLN
125.0000 mg | INTRAVENOUS | Status: AC
  Administered 2019-11-19 – 2019-11-28 (×4): 125 mg via INTRAVENOUS
  Filled 2019-11-18 (×8): qty 10

## 2019-11-18 NOTE — Progress Notes (Signed)
Subjective:  HD done yesterday-  UF total is not recorded  Objective Vital signs in last 24 hours: Vitals:   11/17/19 1226 11/17/19 1632 11/17/19 1931 11/18/19 0313  BP: 123/66 140/73 120/61 120/64  Pulse: 76 86 77 72  Resp: 18 18 20 20   Temp: 97.7 F (36.5 C) 99.9 F (37.7 C) 98.1 F (36.7 C) 98.1 F (36.7 C)  TempSrc: Oral Oral Oral Oral  SpO2: 100% 92% 100% 99%  Weight:      Height:       Weight change:   Intake/Output Summary (Last 24 hours) at 11/18/2019 1006 Last data filed at 11/18/2019 3244 Gross per 24 hour  Intake 840 ml  Output 1150 ml  Net -310 ml    Assessment/ Plan: Pt is a 59 y.o. yo male who was admitted on 10/17/2019 with new start to dialysis due to ESRD  Assessment/Plan: 1. New ESRD-  Has started HD on 7/23.   Has TDC and AVF placed 7/30.  Currently on TTS schedule, will be due tomorrow- CLIP in process but disposition is unknown at this time- many social issues 2. HTN/volume-  BP good/even low no BP meds  3. Anemia- climbing slowly -  darbe 150 and iron  4. Secondary hyperparathyroidism- calcium/phos OK- no meds- PTH 22- have loosened diet a little - this might make him happy ?     Louis Meckel    Labs: Basic Metabolic Panel: Recent Labs  Lab 11/12/19 0745 11/14/19 0730 11/17/19 0727  NA 138 134* 134*  K 3.9 4.2 4.4  CL 104 98 101  CO2 25 24 25   GLUCOSE 158* 274* 136*  BUN 54* 53* 55*  CREATININE 4.38* 4.73* 4.14*  CALCIUM 8.2* 8.3* 9.4  PHOS  --  4.0 4.2   Liver Function Tests: Recent Labs  Lab 11/14/19 0730 11/17/19 0727  ALBUMIN 2.6* 2.7*   No results for input(s): LIPASE, AMYLASE in the last 168 hours. No results for input(s): AMMONIA in the last 168 hours. CBC: Recent Labs  Lab 11/12/19 0745 11/14/19 0730 11/17/19 0727  WBC 6.9 10.3 7.4  HGB 8.4* 8.7* 9.1*  HCT 28.3* 28.9* 29.9*  MCV 86.0 85.8 84.5  PLT 258 257 232   Cardiac Enzymes: No results for input(s): CKTOTAL, CKMB, CKMBINDEX, TROPONINI in the last 168  hours. CBG: Recent Labs  Lab 11/17/19 0631 11/17/19 1229 11/17/19 1631 11/17/19 2132 11/18/19 0520  GLUCAP 136*   136* 121* 154* 137* 118*    Iron Studies: No results for input(s): IRON, TIBC, TRANSFERRIN, FERRITIN in the last 72 hours. Studies/Results: No results found. Medications: Infusions:  sodium chloride 10 mL/hr at 11/13/19 0949   ferric gluconate (FERRLECIT/NULECIT) IV 125 mg (11/14/19 1144)    Scheduled Medications:  aspirin EC  81 mg Oral Daily   Chlorhexidine Gluconate Cloth  6 each Topical Q0600   Chlorhexidine Gluconate Cloth  6 each Topical Q0600   collagenase   Topical Daily   darbepoetin (ARANESP) injection - DIALYSIS  150 mcg Intravenous Q Tue-HD   docusate sodium  100 mg Oral BID   heparin injection (subcutaneous)  5,000 Units Subcutaneous Q8H   hydrocerin   Topical BID   insulin aspart  0-9 Units Subcutaneous TID WC   insulin aspart  4 Units Subcutaneous TID WC   insulin glargine  15 Units Subcutaneous QHS   lidocaine  1 patch Transdermal Q24H   multivitamin with minerals  1 tablet Oral Daily   nutrition supplement (JUVEN)  1 packet  Oral BID BM   pregabalin  50 mg Oral BID   prochlorperazine  10 mg Intravenous QHS   Ensure Max Protein  11 oz Oral BID   sodium chloride flush  3 mL Intravenous Q12H    have reviewed scheduled and prn medications.  Physical Exam: General: resting in HD-  Mostly calm Heart: RRR Lungs: mostly clear Abdomen: soft, non tender Extremities: min edema Dialysis Access: TDC plus AVF on left-  Good thrill     11/18/2019,10:06 AM  LOS: 32 days

## 2019-11-18 NOTE — Progress Notes (Signed)
Physical Therapy Treatment Patient Details Name: Zachary Young MRN: [id number] DOB: 15-Dec-1960 Today's Date: 11/18/2019    History of Present Illness Pt is a 59 y/o male s/p R transtibial amputation. PMH including but not limited to CHF, DM, HTN, L transmet amputation. Pt now s/p HD catheter placement with plan for starting HD while admitted.     PT Comments    Pt seen for mobility progression. Focus of session was on transfers with RW. Pt became very agitated and cussing at therapist when therapist attempted to adjust bed height to a more standard height as pt preferring to stand with bed in fully elevated position. Pt becoming very upset and refusing to further participate. Pt would continue to benefit from skilled physical therapy services at this time while admitted and after d/c to address the below listed limitations in order to improve overall safety and independence with functional mobility.    Follow Up Recommendations  SNF     Equipment Recommendations  Wheelchair (measurements PT);Wheelchair cushion (measurements PT);Other (comment) (with amputee attachment)    Recommendations for Other Services       Precautions / Restrictions Precautions Precautions: Fall Precaution Comments: R limb protector Required Braces or Orthoses: Other Brace Other Brace: R limb protector Restrictions Weight Bearing Restrictions: Yes RLE Weight Bearing: Non weight bearing    Mobility  Bed Mobility Overal bed mobility: Modified Independent                Transfers Overall transfer level: Needs assistance Equipment used: Rolling walker (2 wheeled) Transfers: Sit to/from Stand Sit to Stand: Min guard;From elevated surface         General transfer comment: pt very impulsive and particular about the way he performed transfers and the room set-up. Pt demanding the bed be in fully elevated height despite reporting to this therapist that he sleeps on a couch at home. Pt becoming very  agitated and began cussing at therapist when bed was adjusted to a lower height, then refusing to further participate  Ambulation/Gait                 Stairs             Wheelchair Mobility    Modified Rankin (Stroke Patients Only)       Balance Overall balance assessment: Needs assistance Sitting-balance support: No upper extremity supported;Feet supported Sitting balance-Leahy Scale: Fair     Standing balance support: Bilateral upper extremity supported Standing balance-Leahy Scale: Poor                              Cognition Arousal/Alertness: Awake/alert Behavior During Therapy: Agitated Overall Cognitive Status: Impaired/Different from baseline Area of Impairment: Safety/judgement;Problem solving                         Safety/Judgement: Decreased awareness of safety;Decreased awareness of deficits   Problem Solving: Requires verbal cues;Requires tactile cues        Exercises Other Exercises Other Exercises: pt performed 5x sit<>stand from EOB with min guard for safety; pt very impulsive and particular    General Comments        Pertinent Vitals/Pain Pain Assessment: Faces Faces Pain Scale: Hurts little more Pain Location: stomach Pain Descriptors / Indicators: Sore Pain Intervention(s): Monitored during session;Repositioned    Home Living  Prior Function            PT Goals (current goals can now be found in the care plan section) Acute Rehab PT Goals PT Goal Formulation: With patient Time For Goal Achievement: 12/02/19 Potential to Achieve Goals: Fair Progress towards PT goals: Progressing toward goals    Frequency    Min 3X/week      PT Plan Current plan remains appropriate    Co-evaluation              AM-PAC PT "6 Clicks" Mobility   Outcome Measure  Help needed turning from your back to your side while in a flat bed without using bedrails?: None Help needed  moving from lying on your back to sitting on the side of a flat bed without using bedrails?: None Help needed moving to and from a bed to a chair (including a wheelchair)?: A Little Help needed standing up from a chair using your arms (e.g., wheelchair or bedside chair)?: A Little Help needed to walk in hospital room?: A Lot Help needed climbing 3-5 steps with a railing? : Total 6 Click Score: 17    End of Session   Activity Tolerance: Patient tolerated treatment well Patient left: in bed;with bed alarm set Nurse Communication: Mobility status PT Visit Diagnosis: Other abnormalities of gait and mobility (R26.89) Pain - Right/Left: Right Pain - part of body: Leg     Time: 2010-0712 PT Time Calculation (min) (ACUTE ONLY): 17 min  Charges:  $Therapeutic Activity: 8-22 mins                     Anastasio Champion, DPT  Acute Rehabilitation Services Pager [phone] Office [place] 11/18/2019, 4:47 PM

## 2019-11-18 NOTE — Progress Notes (Signed)
Nutrition Follow-up  DOCUMENTATION CODES:   Obesity unspecified  INTERVENTION:  ContinueEnsure Max poBID, each supplement provides 150 kcal and 30 grams of protein.  Encourage adequate PO intake.   NUTRITION DIAGNOSIS:   Increased nutrient needs related to wound healing as evidenced by estimated needs; ongoing  GOAL:   Patient will meet greater than or equal to 90% of their needs; met  MONITOR:   PO intake, Supplement acceptance, Skin, Labs, Weight trends, I & O's  REASON FOR ASSESSMENT:   Consult Wound healing  ASSESSMENT:   Pt presented with wound of R foot and hyperglycemia. PMH significant for kidney disease, T2DM, CHF, HTN, diabetic neuropathy   7/7 - s/p Right BKA. 7/22 - Right IJ approachtunneled HD catheter placed 7/23 - HD initiated 7/30 - Left brachiobasilic AV fistula creation  RD working remotely.  Meal completion has been 100%. Pt currently has Ensure Max ordered and has been consuming them. RD to continue with current orders to aid in caloric and protein needs. Noted juven ordered, however pt has been refusing them recently. RD to discontinue Juven order. Labs and medications reviewed.   Diet Order:   Diet Order            Diet 2 gram sodium Room service appropriate? Yes; Fluid consistency: Thin; Fluid restriction: 1500 mL Fluid  Diet effective now                 EDUCATION NEEDS:   No education needs have been identified at this time  Skin:  Skin Assessment: Reviewed RN Assessment Skin Integrity Issues:: Incisions Wound Vac: N/A Diabetic Ulcer: N/A Incisions: R leg Other: amputation - all toes  Last BM:  8/1  Height:   Ht Readings from Last 1 Encounters:  10/21/19 _0  (1.905 m)    Weight:   Wt Readings from Last 1 Encounters:  11/17/19 129 kg   BMI:  Body mass index is 35.55 kg/m.  Estimated Nutritional Needs:   Kcal:  2400-2600  Protein:  130-140 grams  Fluid:  >2L/d  Corrin Parker, MS, RD, LDN RD pager  number/after hours weekend pager number on Am[employer].

## 2019-11-18 NOTE — TOC Progression Note (Signed)
Transition of Care [facility]) - Progression Note    Patient Details  Name: Zachary Young MRN: [id number] Date of Birth: [birth year]  Transition of Care [facility]) CM/SW [place], [place] Phone Number: 11/18/2019, 1:02 PM  Clinical Narrative:    CSW spoke with [facility] team leadership, have requested their assistance at this time as all venues that [facility] team is aware of have been exhausted, no offers from SNF at this time.   Expected Discharge Plan: Skilled Nursing Facility Barriers to Discharge: Continued Medical Work up, No SNF bed, Homeless with medical needs, Waiting for outpatient dialysis  Expected Discharge Plan and Services Expected Discharge Plan: [place] In-house Referral: Clinical Social Work Discharge Planning Services: CM Consult Post Acute Care Choice: [place], Dialysis Living arrangements for the past 2 months: No permanent address   Readmission Risk Interventions Readmission Risk Prevention Plan 11/12/2019  Transportation Screening Complete  Medication Review [employer]) Referral to Pharmacy  PCP or Specialist appointment within 3-5 days of discharge Not Complete  PCP/Specialist Appt Not Complete comments plan for SNF; no PCP  HRI or Home Care Consult Complete  SW Recovery Care/Counseling Consult Complete  Palliative Care Screening Complete  Skilled Nursing Facility Complete  Some recent data might be hidden
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
## 2019-11-19 LAB — CBC
HCT: 30.4 % — ABNORMAL LOW (ref 39.0–52.0)
Hemoglobin: 9.4 g/dL — ABNORMAL LOW (ref 13.0–17.0)
MCH: 26.6 pg (ref 26.0–34.0)
MCHC: 30.9 g/dL (ref 30.0–36.0)
MCV: 85.9 fL (ref 80.0–100.0)
Platelets: 237 10*3/uL (ref 150–400)
RBC: 3.54 MIL/uL — ABNORMAL LOW (ref 4.22–5.81)
RDW: 16 % — ABNORMAL HIGH (ref 11.5–15.5)
WBC: 9.6 10*3/uL (ref 4.0–10.5)
nRBC: 0 % (ref 0.0–0.2)

## 2019-11-19 LAB — GLUCOSE, CAPILLARY
Glucose-Capillary: 182 mg/dL — ABNORMAL HIGH (ref 70–99)
Glucose-Capillary: 123 mg/dL — ABNORMAL HIGH (ref 70–99)
Glucose-Capillary: 190 mg/dL — ABNORMAL HIGH (ref 70–99)
Glucose-Capillary: 141 mg/dL — ABNORMAL HIGH (ref 70–99)

## 2019-11-19 LAB — RENAL FUNCTION PANEL
Albumin: 2.6 g/dL — ABNORMAL LOW (ref 3.5–5.0)
Anion gap: 12 (ref 5–15)
BUN: 47 mg/dL — ABNORMAL HIGH (ref 6–20)
CO2: 24 mmol/L (ref 22–32)
Calcium: 8.7 mg/dL — ABNORMAL LOW (ref 8.9–10.3)
Chloride: 98 mmol/L (ref 98–111)
Creatinine, Ser: 4.37 mg/dL — ABNORMAL HIGH (ref 0.61–1.24)
GFR calc Af Amer: 16 mL/min — ABNORMAL LOW (ref >60)
GFR calc non Af Amer: 14 mL/min — ABNORMAL LOW (ref >60)
Glucose, Bld: 225 mg/dL — ABNORMAL HIGH (ref 70–99)
Phosphorus: 3.1 mg/dL (ref 2.5–4.6)
Potassium: 4.8 mmol/L (ref 3.5–5.1)
Sodium: 134 mmol/L — ABNORMAL LOW (ref 135–145)

## 2019-11-19 MED ORDER — ALBUMIN HUMAN 25 % IV SOLN
INTRAVENOUS | Status: AC
  Filled 2019-11-19: qty 100

## 2019-11-19 MED ORDER — DOCUSATE SODIUM 100 MG PO CAPS
100.0000 mg | ORAL_CAPSULE | Freq: Every day | ORAL | Status: DC | PRN
  Administered 2019-11-23 – 2019-12-01 (×7): 100 mg via ORAL
  Filled 2019-11-19 (×8): qty 1

## 2019-11-19 MED ORDER — ALBUMIN HUMAN 25 % IV SOLN
25.0000 g | Freq: Once | INTRAVENOUS | Status: AC
  Administered 2019-11-19: 25 g via INTRAVENOUS

## 2019-11-19 MED ORDER — HEPARIN SODIUM (PORCINE) 1000 UNIT/ML IJ SOLN
INTRAMUSCULAR | Status: AC
  Filled 2019-11-19: qty 3

## 2019-11-19 MED ORDER — HEPARIN SODIUM (PORCINE) 1000 UNIT/ML DIALYSIS
20.0000 [IU]/kg | INTRAMUSCULAR | Status: DC | PRN
  Administered 2019-11-19: 2600 [IU] via INTRAVENOUS_CENTRAL

## 2019-11-19 MED ORDER — HEPARIN SODIUM (PORCINE) 1000 UNIT/ML IJ SOLN
INTRAMUSCULAR | Status: AC
  Administered 2019-11-19: 3200 [IU]
  Filled 2019-11-19: qty 4

## 2019-11-19 MED ORDER — LOPERAMIDE HCL 2 MG PO CAPS: 2.0000 mg | ORAL_CAPSULE | Freq: Once | ORAL | Status: DC

## 2019-11-19 MED ORDER — SIMETHICONE 80 MG PO CHEW
80.0000 mg | CHEWABLE_TABLET | Freq: Four times a day (QID) | ORAL | Status: DC | PRN
  Filled 2019-11-19: qty 1

## 2019-11-19 MED ORDER — MIDODRINE HCL 5 MG PO TABS
10.0000 mg | ORAL_TABLET | Freq: Three times a day (TID) | ORAL | Status: DC
  Administered 2019-11-19 – 2019-12-12 (×64): 10 mg via ORAL
  Filled 2019-11-19 (×67): qty 2

## 2019-11-19 NOTE — Procedures (Signed)
Patient was seen on dialysis and the procedure was supervised.  BFR 400  Via TDC BP is  93/57.   Patient appears to be tolerating treatment well but BP is low-  Will use albumin but then also start midodrine chronically   Louis Meckel 11/19/2019
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
## 2019-11-19 NOTE — Progress Notes (Signed)
Patient refusing for me to set bed alarm and does not want my assistance to the [facility].

## 2019-11-19 NOTE — Progress Notes (Signed)
OT Cancellation Note  Patient Details Name: Zachary Young MRN: [id number] DOB: 01-28-1961   Cancelled Treatment:    Reason Eval/Treat Not Completed: Patient at procedure or test/ unavailable (HD)  Merri Ray Anais Denslow 11/19/2019, 8:10 AM   Jesse Sans OTR/L Acute Rehabilitation Services Pager: [phone] Office: [phone]

## 2019-11-19 NOTE — Progress Notes (Signed)
Subjective:  Seen on HD-  Some low BP's but he is alert and complaining  Objective Vital signs in last 24 hours: Vitals:   11/19/19 0745 11/19/19 0800 11/19/19 0815 11/19/19 0830  BP: 110/67 (!) 88/40 (!) 72/49 (!) 87/59  Pulse: 88 88 (!) 103 82  Resp:      Temp:      TempSrc:      SpO2:      Weight:      Height:       Weight change: -1.2 kg  Intake/Output Summary (Last 24 hours) at 11/19/2019 1610 Last data filed at 11/18/2019 2138 Gross per 24 hour  Intake 360 ml  Output 650 ml  Net -290 ml    Assessment/ Plan: Pt is a 59 y.o. yo male who was admitted on 10/17/2019 with new start to dialysis due to ESRD  Assessment/Plan: 1. New ESRD-  Has started HD on 7/23.   Has TDC and AVF placed 7/30.  Currently on TTS schedule, will be due next on Saturday.   CLIP in process but disposition is unknown at this time- many social issues 2. HTN/volume-  BP good/even low no BP meds -  Has IDH-  Will try some midodrine 3. Anemia- climbing slowly -  darbe 150 and iron  4. Secondary hyperparathyroidism- calcium/phos OK- no meds- PTH 22- have loosened diet a little in an attempt to improve his mood    Louis Meckel    Labs: Basic Metabolic Panel: Recent Labs  Lab 11/14/19 0730 11/17/19 0727 11/19/19 0629  NA 134* 134* 134*  K 4.2 4.4 4.8  CL 98 101 98  CO2 24 25 24   GLUCOSE 274* 136* 225*  BUN 53* 55* 47*  CREATININE 4.73* 4.14* 4.37*  CALCIUM 8.3* 9.4 8.7*  PHOS 4.0 4.2 3.1   Liver Function Tests: Recent Labs  Lab 11/14/19 0730 11/17/19 0727 11/19/19 0629  ALBUMIN 2.6* 2.7* 2.6*   No results for input(s): LIPASE, AMYLASE in the last 168 hours. No results for input(s): AMMONIA in the last 168 hours. CBC: Recent Labs  Lab 11/14/19 0730 11/17/19 0727 11/19/19 0629  WBC 10.3 7.4 9.6  HGB 8.7* 9.1* 9.4*  HCT 28.9* 29.9* 30.4*  MCV 85.8 84.5 85.9  PLT 257 232 237   Cardiac Enzymes: No results for input(s): CKTOTAL, CKMB, CKMBINDEX, TROPONINI in the last 168  hours. CBG: Recent Labs  Lab 11/18/19 0520 11/18/19 1114 11/18/19 1615 11/18/19 2120 11/19/19 0619  GLUCAP 118* 138* 131* 142* 182*    Iron Studies: No results for input(s): IRON, TIBC, TRANSFERRIN, FERRITIN in the last 72 hours. Studies/Results: No results found. Medications: Infusions: . sodium chloride 10 mL/hr at 11/13/19 0949  . albumin human    . ferric gluconate (FERRLECIT/NULECIT) IV 125 mg (11/19/19 0837)    Scheduled Medications: . aspirin EC  81 mg Oral Daily  . Chlorhexidine Gluconate Cloth  6 each Topical Q0600  . Chlorhexidine Gluconate Cloth  6 each Topical Q0600  . Chlorhexidine Gluconate Cloth  6 each Topical Q0600  . collagenase   Topical Daily  . darbepoetin (ARANESP) injection - DIALYSIS  150 mcg Intravenous Q Tue-HD  . docusate sodium  100 mg Oral BID  . heparin injection (subcutaneous)  5,000 Units Subcutaneous Q8H  . heparin sodium (porcine)      . hydrocerin   Topical BID  . insulin aspart  0-9 Units Subcutaneous TID WC  . insulin aspart  4 Units Subcutaneous TID WC  . insulin glargine  15 Units Subcutaneous QHS  . lidocaine  1 patch Transdermal Q24H  . multivitamin with minerals  1 tablet Oral Daily  . pregabalin  50 mg Oral BID  . prochlorperazine  10 mg Intravenous QHS  . Ensure Max Protein  11 oz Oral BID  . sodium chloride flush  3 mL Intravenous Q12H    have reviewed scheduled and prn medications.  Physical Exam: General: resting in HD-  Mostly calm Heart: RRR Lungs: mostly clear Abdomen: soft, non tender Extremities: min edema Dialysis Access: TDC plus AVF on left-  Good thrill     11/19/2019,9:26 AM  LOS: 33 days

## 2019-11-20 DIAGNOSIS — G546 Phantom limb syndrome with pain: Secondary | ICD-10-CM | POA: Diagnosis not present

## 2019-11-20 DIAGNOSIS — Z89511 Acquired absence of right leg below knee: Secondary | ICD-10-CM | POA: Diagnosis not present

## 2019-11-20 DIAGNOSIS — N186 End stage renal disease: Secondary | ICD-10-CM | POA: Diagnosis not present

## 2019-11-20 DIAGNOSIS — Z87898 Personal history of other specified conditions: Secondary | ICD-10-CM | POA: Diagnosis not present

## 2019-11-20 LAB — GLUCOSE, CAPILLARY
Glucose-Capillary: 113 mg/dL — ABNORMAL HIGH (ref 70–99)
Glucose-Capillary: 159 mg/dL — ABNORMAL HIGH (ref 70–99)
Glucose-Capillary: 130 mg/dL — ABNORMAL HIGH (ref 70–99)
Glucose-Capillary: 159 mg/dL — ABNORMAL HIGH (ref 70–99)

## 2019-11-20 MED ORDER — CHLORHEXIDINE GLUCONATE CLOTH 2 % EX PADS
6.0000 | MEDICATED_PAD | Freq: Every day | CUTANEOUS | Status: DC
  Administered 2019-11-22 – 2019-11-26 (×5): 6 via TOPICAL

## 2019-11-20 NOTE — Progress Notes (Signed)
Subjective:  Completed HD yest-  Removed 1100-  Some borderline BPs- he is eating lunch right now- no particular complaints   Objective Vital signs in last 24 hours: Vitals:   11/19/19 1612 11/20/19 0041 11/20/19 0617 11/20/19 1209  BP: 128/61 133/65 (!) 159/72 (!) 150/70  Pulse: 73 71 74 70  Resp: 17 18 18 16   Temp: 98.9 F (37.2 C) 97.9 F (36.6 C) 97.9 F (36.6 C) (!) 97.5 F (36.4 C)  TempSrc: Oral Oral Oral Oral  SpO2: 99% 98% 100% 98%  Weight:   128.8 kg   Height:       Weight change: 0 kg  Intake/Output Summary (Last 24 hours) at 11/20/2019 1211 Last data filed at 11/20/2019 9191 Gross per 24 hour  Intake 960 ml  Output 200 ml  Net 760 ml    Assessment/ Plan: Pt is a 59 y.o. yo male who was admitted on 10/17/2019 with new start to dialysis due to ESRD  Assessment/Plan: 1. New ESRD-  Has started HD on 7/23.   Has TDC and AVF placed 7/30.  Currently on TTS schedule, will be due next on Saturday.   CLIP in process but disposition is unknown at this time- many social issues 2. HTN/volume-  BP good/even low no BP meds -  Has IDH-  Will try some midodrine 3. Anemia- climbing slowly -  darbe 150 and iron  4. Secondary hyperparathyroidism- calcium/phos OK- no meds- PTH 22- have loosened diet a little in an attempt to improve his mood    Louis Meckel    Labs: Basic Metabolic Panel: Recent Labs  Lab 11/14/19 0730 11/17/19 0727 11/19/19 0629  NA 134* 134* 134*  K 4.2 4.4 4.8  CL 98 101 98  CO2 24 25 24   GLUCOSE 274* 136* 225*  BUN 53* 55* 47*  CREATININE 4.73* 4.14* 4.37*  CALCIUM 8.3* 9.4 8.7*  PHOS 4.0 4.2 3.1   Liver Function Tests: Recent Labs  Lab 11/14/19 0730 11/17/19 0727 11/19/19 0629  ALBUMIN 2.6* 2.7* 2.6*   No results for input(s): LIPASE, AMYLASE in the last 168 hours. No results for input(s): AMMONIA in the last 168 hours. CBC: Recent Labs  Lab 11/14/19 0730 11/17/19 0727 11/19/19 0629  WBC 10.3 7.4 9.6  HGB 8.7* 9.1* 9.4*  HCT  28.9* 29.9* 30.4*  MCV 85.8 84.5 85.9  PLT 257 232 237   Cardiac Enzymes: No results for input(s): CKTOTAL, CKMB, CKMBINDEX, TROPONINI in the last 168 hours. CBG: Recent Labs  Lab 11/19/19 1249 11/19/19 1610 11/19/19 2140 11/20/19 0623 11/20/19 1111  GLUCAP 123* 190* 141* 113* 159*    Iron Studies: No results for input(s): IRON, TIBC, TRANSFERRIN, FERRITIN in the last 72 hours. Studies/Results: No results found. Medications: Infusions:  sodium chloride 10 mL/hr at 11/13/19 0949   ferric gluconate (FERRLECIT/NULECIT) IV Stopped (11/19/19 1033)    Scheduled Medications:  aspirin EC  81 mg Oral Daily   Chlorhexidine Gluconate Cloth  6 each Topical Q0600   Chlorhexidine Gluconate Cloth  6 each Topical Q0600   Chlorhexidine Gluconate Cloth  6 each Topical Q0600   collagenase   Topical Daily   darbepoetin (ARANESP) injection - DIALYSIS  150 mcg Intravenous Q Tue-HD   heparin injection (subcutaneous)  5,000 Units Subcutaneous Q8H   hydrocerin   Topical BID   insulin aspart  0-9 Units Subcutaneous TID WC   insulin aspart  4 Units Subcutaneous TID WC   insulin glargine  15 Units Subcutaneous QHS  lidocaine  1 patch Transdermal Q24H   loperamide  2 mg Oral Once   midodrine  10 mg Oral TID WC   multivitamin with minerals  1 tablet Oral Daily   pregabalin  50 mg Oral BID   prochlorperazine  10 mg Intravenous QHS   Ensure Max Protein  11 oz Oral BID   sodium chloride flush  3 mL Intravenous Q12H    have reviewed scheduled and prn medications.  Physical Exam: General: eating lunch -  Mostly calm Heart: RRR Lungs: mostly clear Abdomen: soft, non tender Extremities: min edema Dialysis Access: TDC plus AVF on left-  Good thrill     11/20/2019,12:11 PM  LOS: 34 days
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
## 2019-11-20 NOTE — Progress Notes (Signed)
Occupational Therapy Treatment Patient Details Name: Jackson Coffield MRN: [id number] DOB: 10/14/1960 Today's Date: 11/20/2019    History of present illness Pt is a 59 y/o male s/p R transtibial amputation. PMH including but not limited to CHF, DM, HTN, L transmet amputation. Pt now s/p HD catheter placement with plan for starting HD while admitted.    OT comments  Pt progressing with OT goals and agreeable to complete ADL transfers with OT today. Pt continues to have dynamic standing balance deficits, requiring Min A for safe transfer to/from [facility] using RW. Pt does become agitated after realizing difficulty in transferring, but able to be redirected. Extended time spent discussing OT POC, deficit areas to address, and DME that may be helpful once ready for DC. Discussed compensatory strategies for LB ADLs due to balance deficits and high fall risk. Pt actively involved in discussion of functional abilities, then after transfer training, ended OT session of his own accord.    Follow Up Recommendations  SNF;Supervision/Assistance - 24 hour    Equipment Recommendations  3 in 1 bedside commode;Tub/shower bench;Wheelchair (measurements OT);Wheelchair cushion (measurements OT);Other (comment) (RW)    Recommendations for Other Services      Precautions / Restrictions Precautions Precautions: Fall Precaution Comments: R limb protector Required Braces or Orthoses: Other Brace Other Brace: R limb protector Restrictions Weight Bearing Restrictions: Yes RLE Weight Bearing: Non weight bearing       Mobility Bed Mobility Overal bed mobility: Modified Independent Bed Mobility: Supine to Sit;Sit to Supine     Supine to sit: Modified independent (Device/Increase time) Sit to supine: Modified independent (Device/Increase time)      Transfers Overall transfer level: Needs assistance Equipment used: Rolling walker (2 wheeled) Transfers: Sit to/from [facility] Sit to Stand: Min  guard Stand pivot transfers: Min assist       General transfer comment: min guard for sit to stand and extended time to gain balance, holding on to bedrail before reaching for RW. Min A for SPT bed <> BSC with RW    Balance Overall balance assessment: Needs assistance Sitting-balance support: No upper extremity supported;Feet supported Sitting balance-Leahy Scale: Good Sitting balance - Comments: pt sitting EOB without UE support   Standing balance support: Bilateral upper extremity supported Standing balance-Leahy Scale: Poor Standing balance comment: reliant on UE support, difficulty gaining balance during transitional movements                            ADL either performed or assessed with clinical judgement   ADL Overall ADL's : Needs assistance/impaired Eating/Feeding: Independent;Bed level Eating/Feeding Details (indicate cue type and reason): Independent eating lunch on OT arrival     Upper Body Bathing: Set up;Sitting Upper Body Bathing Details (indicate cue type and reason): Setup to bathe UB, B UE due to tape residue         Lower Body Dressing: Minimal assistance;Bed level Lower Body Dressing Details (indicate cue type and reason): MIn A to don R limb protector sitting EOB Toilet Transfer: Minimal assistance;Stand-pivot;BSC;RW Toilet Transfer Details (indicate cue type and reason): Min A for SPT to maintain steadiness in standing using RW           General ADL Comments: Pt with decreased dynamic standing balance, reliant on UE support to maintain steadiness. Pt with good UE strength to assist in improved ADL transfer abilities     Vision   Vision Assessment?: No apparent visual deficits   Perception  Praxis      Cognition Arousal/Alertness: Awake/alert Behavior During Therapy: WFL for tasks assessed/performed;Agitated Overall Cognitive Status: Impaired/Different from baseline Area of Impairment: Safety/judgement;Problem  solving;Memory                     Memory: Decreased short-term memory   Safety/Judgement: Decreased awareness of safety;Decreased awareness of deficits   Problem Solving: Requires verbal cues;Requires tactile cues General Comments: Pt behavior calm and agreeable to attempt activities, but once realized his difficulty with balance, became agitated. Pt reports 2 falls while at hospital, but per nursing, this is not true        Exercises     Shoulder Instructions       General Comments Pt pleasant and receptive to conversation, but became agitated once began moving (suspect due to pt realizing deficits). Time spent collaborating with pt on DME that will be helpful, valued goals and deficits to be addressed. Pt reasonably collaborative during discussion, but decided to independently end therapy session.     Pertinent Vitals/ Pain       Pain Assessment: Faces Faces Pain Scale: Hurts a little bit Pain Location: residual limb when donning protector Pain Descriptors / Indicators: Sore Pain Intervention(s): Monitored during session  Home Living                                          Prior Functioning/Environment              Frequency  Min 2X/week        Progress Toward Goals  OT Goals(current goals can now be found in the care plan section)  Progress towards OT goals: Progressing toward goals  Acute Rehab OT Goals Patient Stated Goal: to go home OT Goal Formulation: With patient Time For Goal Achievement: 11/30/19 Potential to Achieve Goals: Fair ADL Goals Pt Will Perform Lower Body Dressing: with modified independence;with adaptive equipment;sitting/lateral leans Pt Will Transfer to Toilet: stand pivot transfer;with min assist Pt Will Perform Tub/Shower Transfer: Tub transfer;tub bench;with min assist  Plan Discharge plan remains appropriate    Co-evaluation                 AM-PAC OT "6 Clicks" Daily Activity     Outcome  Measure   Help from another person eating meals?: None Help from another person taking care of personal grooming?: A Little Help from another person toileting, which includes using toliet, bedpan, or urinal?: A Lot Help from another person bathing (including washing, rinsing, drying)?: A Little Help from another person to put on and taking off regular upper body clothing?: A Little Help from another person to put on and taking off regular lower body clothing?: A Lot 6 Click Score: 17    End of Session Equipment Utilized During Treatment: Rolling walker  OT Visit Diagnosis: Other abnormalities of gait and mobility (R26.89);Unsteadiness on feet (R26.81);Muscle weakness (generalized) (M62.81);Other symptoms and signs involving cognitive function;Pain Pain - Right/Left: Right Pain - part of body: Leg   Activity Tolerance Patient tolerated treatment well   Patient Left in bed;with call bell/phone within reach;with bed alarm set   Nurse Communication Mobility status        Time: 4270-6237 OT Time Calculation (min): 23 min  Charges: OT General Charges $OT Visit: 1 Visit OT Treatments $Self Care/Home Management : 8-22 mins $Therapeutic Activity: 8-22 mins  Layla Maw, OTR/L  Layla Maw 11/20/2019, 12:41 PM

## 2019-11-20 NOTE — Progress Notes (Signed)
  PROGRESS NOTE    Zachary Young   VKP:[id number]  DOB: July 11, 1960  DOA: 10/17/2019     SUBJECTIVE: Patient's appetite is good he is active outside, no fever overnight, no pain, no dyspnea.    OBJECTIVE: BP 136/73 (BP Location: Right Arm)   Pulse 75   Temp 97.9 F (36.6 C) (Oral)   Resp 16   Ht 6\' 3"  (1.905 m)   Wt 126.4 kg   SpO2 100%   BMI 34.83 kg/m   General: Patient adult male, lying in bed, eating lunch HEENT:    Cardiac: RRR, no murmurs, no lower extremity edema Respiratory: Normal respiratory rate rhythm, lungs clear without rales or wheezes Abdomen: Abdomen soft without tenderness palpation Extremities: Left midfoot amputation Neuro: Awake and alert, extraocular movements intact, moves all extremities with normal strength and coordination, speech fluent Psych:        ASSESSMENT AND PLAN:  Right foot osteomyelitis Sp RIGHT BKA 7.7 by Dr. Sharol Given -PT  ESRD -Consult nephrology, appreciate recommendations  Diabetes, type II with macrovascular complications and polyneuropathy, long-term insulin dependence Glucose controlled -Continue Lantus -Continue sliding scale corrections -Continue Lyrica  CHronic diastolic CHF Appears euvolemic -Continue aspirin  Anemia -Continue IV iron  Hypotension -Continue midodrine          Edwin Dada MD 11/21/2019

## 2019-11-21 DIAGNOSIS — L02611 Cutaneous abscess of right foot: Secondary | ICD-10-CM | POA: Diagnosis not present

## 2019-11-21 LAB — RENAL FUNCTION PANEL
Albumin: 2.8 g/dL — ABNORMAL LOW (ref 3.5–5.0)
Anion gap: 10 (ref 5–15)
BUN: 35 mg/dL — ABNORMAL HIGH (ref 6–20)
CO2: 25 mmol/L (ref 22–32)
Calcium: 8.4 mg/dL — ABNORMAL LOW (ref 8.9–10.3)
Chloride: 99 mmol/L (ref 98–111)
Creatinine, Ser: 4.35 mg/dL — ABNORMAL HIGH (ref 0.61–1.24)
GFR calc Af Amer: 16 mL/min — ABNORMAL LOW (ref >60)
GFR calc non Af Amer: 14 mL/min — ABNORMAL LOW (ref >60)
Glucose, Bld: 231 mg/dL — ABNORMAL HIGH (ref 70–99)
Phosphorus: 3.7 mg/dL (ref 2.5–4.6)
Potassium: 4.3 mmol/L (ref 3.5–5.1)
Sodium: 134 mmol/L — ABNORMAL LOW (ref 135–145)

## 2019-11-21 LAB — GLUCOSE, CAPILLARY
Glucose-Capillary: 134 mg/dL — ABNORMAL HIGH (ref 70–99)
Glucose-Capillary: 151 mg/dL — ABNORMAL HIGH (ref 70–99)
Glucose-Capillary: 175 mg/dL — ABNORMAL HIGH (ref 70–99)
Glucose-Capillary: 231 mg/dL — ABNORMAL HIGH (ref 70–99)

## 2019-11-21 LAB — CBC
HCT: 29.9 % — ABNORMAL LOW (ref 39.0–52.0)
Hemoglobin: 8.9 g/dL — ABNORMAL LOW (ref 13.0–17.0)
MCH: 26.1 pg (ref 26.0–34.0)
MCHC: 29.8 g/dL — ABNORMAL LOW (ref 30.0–36.0)
MCV: 87.7 fL (ref 80.0–100.0)
Platelets: 248 10*3/uL (ref 150–400)
RBC: 3.41 MIL/uL — ABNORMAL LOW (ref 4.22–5.81)
RDW: 16.9 % — ABNORMAL HIGH (ref 11.5–15.5)
WBC: 8.7 10*3/uL (ref 4.0–10.5)
nRBC: 0 % (ref 0.0–0.2)

## 2019-11-21 MED ORDER — HEPARIN SODIUM (PORCINE) 1000 UNIT/ML DIALYSIS: 20.0000 [IU]/kg | INTRAMUSCULAR | Status: DC | PRN

## 2019-11-21 MED ORDER — HEPARIN SODIUM (PORCINE) 1000 UNIT/ML IJ SOLN
INTRAMUSCULAR | Status: AC
  Filled 2019-11-21: qty 4

## 2019-11-21 NOTE — Progress Notes (Signed)
Subjective:  Seen on HD-  No c/o's-  Frustrated about being here   Objective Vital signs in last 24 hours: Vitals:   11/21/19 0900 11/21/19 0930 11/21/19 1000 11/21/19 1030  BP: (!) 107/50 (!) 100/55 (!) 112/55 113/61  Pulse: 76 75 73 78  Resp:      Temp:      TempSrc:      SpO2:      Weight:      Height:       Weight change:   Intake/Output Summary (Last 24 hours) at 11/21/2019 1057 Last data filed at 11/21/2019 0559 Gross per 24 hour  Intake 240 ml  Output 600 ml  Net -360 ml    Assessment/ Plan: Pt is a 59 y.o. yo male who was admitted on 10/17/2019 with new start to dialysis due to ESRD  Assessment/Plan: 1. New ESRD-  Has started HD on 7/23.   Has TDC and AVF placed 7/30.  Currently on TTS schedule, will be due next on Tuesday.   CLIP in process but disposition is unknown at this time- many social issues "unable to procure appropriate housing- registered sex offender - family members who refuse to take him back" 2. HTN/volume-  BP good/even low no BP meds -  Has IDH-  try some midodrine 3. Anemia- climbing slowly -  darbe 150 and iron  4. Secondary hyperparathyroidism- calcium/phos OK- no meds- PTH 22- have loosened diet a little in an attempt to improve his mood   Will not see patient tomorrow- Sunday , will revisit on Monday-  HD next for Tuesday-  Call with any questions tomorrow    Louis Meckel    Labs: Basic Metabolic Panel: Recent Labs  Lab 11/17/19 0727 11/19/19 0629 11/21/19 0809  NA 134* 134* 134*  K 4.4 4.8 4.3  CL 101 98 99  CO2 _0 GLUCOSE 136* 225* 231*  BUN 55* 47* 35*  CREATININE 4.14* 4.37* 4.35*  CALCIUM 9.4 8.7* 8.4*  PHOS 4.2 3.1 3.7   Liver Function Tests: Recent Labs  Lab 11/17/19 0727 11/19/19 0629 11/21/19 0809  ALBUMIN 2.7* 2.6* 2.8*   No results for input(s): LIPASE, AMYLASE in the last 168 hours. No results for input(s): AMMONIA in the last 168 hours. CBC: Recent Labs  Lab 11/17/19 0727 11/19/19 0629  11/21/19 0809  WBC 7.4 9.6 8.7  HGB 9.1* 9.4* 8.9*  HCT 29.9* 30.4* 29.9*  MCV 84.5 85.9 87.7  PLT 232 237 248   Cardiac Enzymes: No results for input(s): CKTOTAL, CKMB, CKMBINDEX, TROPONINI in the last 168 hours. CBG: Recent Labs  Lab 11/20/19 0623 11/20/19 1111 11/20/19 1611 11/20/19 2127 11/21/19 0630  GLUCAP 113* 159* 130* 159* 134*    Iron Studies: No results for input(s): IRON, TIBC, TRANSFERRIN, FERRITIN in the last 72 hours. Studies/Results: No results found. Medications: Infusions: . sodium chloride 10 mL/hr at 11/13/19 0949  . ferric gluconate (FERRLECIT/NULECIT) IV Stopped (11/19/19 1033)    Scheduled Medications: . aspirin EC  81 mg Oral Daily  . Chlorhexidine Gluconate Cloth  6 each Topical Q0600  . Chlorhexidine Gluconate Cloth  6 each Topical Q0600  . Chlorhexidine Gluconate Cloth  6 each Topical Q0600  . Chlorhexidine Gluconate Cloth  6 each Topical Q0600  . collagenase   Topical Daily  . darbepoetin (ARANESP) injection - DIALYSIS  150 mcg Intravenous Q Tue-HD  . heparin injection (subcutaneous)  5,000 Units Subcutaneous Q8H  . heparin sodium (porcine)      .  hydrocerin   Topical BID  . insulin aspart  0-9 Units Subcutaneous TID WC  . insulin aspart  4 Units Subcutaneous TID WC  . insulin glargine  15 Units Subcutaneous QHS  . lidocaine  1 patch Transdermal Q24H  . loperamide  2 mg Oral Once  . midodrine  10 mg Oral TID WC  . multivitamin with minerals  1 tablet Oral Daily  . pregabalin  50 mg Oral BID  . prochlorperazine  10 mg Intravenous QHS  . Ensure Max Protein  11 oz Oral BID  . sodium chloride flush  3 mL Intravenous Q12H    have reviewed scheduled and prn medications.  Physical Exam: General: on HD -  Mostly calm Heart: RRR Lungs: mostly clear Abdomen: soft, non tender Extremities: min edema Dialysis Access: TDC plus AVF on left-  Good thrill     11/21/2019,10:57 AM  LOS: 35 days

## 2019-11-21 NOTE — Procedures (Signed)
Patient was seen on dialysis and the procedure was supervised.  BFR 400  Via TDC BP is  113/61.   Patient appears to be tolerating treatment well  Louis Meckel 11/21/2019

## 2019-11-21 NOTE — Progress Notes (Signed)
PT Cancellation Note  Patient Details Name: Zachary Young MRN: [id number] DOB: [birth year]   Cancelled Treatment:    Reason Eval/Treat Not Completed: Patient at procedure or test/unavailable (HD). PT will continue to f/u with pt acutely as available.    [place] 11/21/2019, 7:48 AM

## 2019-11-22 DIAGNOSIS — N184 Chronic kidney disease, stage 4 (severe): Secondary | ICD-10-CM | POA: Diagnosis not present

## 2019-11-22 LAB — GLUCOSE, CAPILLARY
Glucose-Capillary: 106 mg/dL — ABNORMAL HIGH (ref 70–99)
Glucose-Capillary: 183 mg/dL — ABNORMAL HIGH (ref 70–99)
Glucose-Capillary: 192 mg/dL — ABNORMAL HIGH (ref 70–99)
Glucose-Capillary: 126 mg/dL — ABNORMAL HIGH (ref 70–99)

## 2019-11-22 NOTE — Progress Notes (Signed)
  PROGRESS NOTE    Zachary Young   YOY:[id number]  DOB: 06-05-1960  DOA: 10/17/2019     SUBJECTIVE: No fever, no pain, no dyspnea.    OBJECTIVE: BP 134/60 (BP Location: Right Arm)   Pulse 67   Temp 98.1 F (36.7 C) (Oral)   Resp 16   Ht 6\' 3"  (1.905 m)   Wt 126.4 kg   SpO2 99%   BMI 34.83 kg/m   General: Adult male, lying in bed, watching television, interactive HEENT:    Cardiac: RRR, no murmurs, no lower extremity edema Respiratory: Normal respiratory rate and rhythm, lungs clear without rales wheezes Abdomen: Abdomen soft but tenderness palpation or guarding Extremities: Right BKA, left midfoot amputation old Neuro: Awake and alert, extraocular movements intact, moves all extremities normal strength and coronation, speech fluent Psych:   Attention normal, affect normal, judgment normal     ASSESSMENT AND PLAN:  Right foot osteomyelitis Sp RIGHT BKA 7.7 by Dr. 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

## 2019-11-23 DIAGNOSIS — Z89511 Acquired absence of right leg below knee: Secondary | ICD-10-CM | POA: Diagnosis not present

## 2019-11-23 DIAGNOSIS — Z87898 Personal history of other specified conditions: Secondary | ICD-10-CM | POA: Diagnosis not present

## 2019-11-23 DIAGNOSIS — N186 End stage renal disease: Secondary | ICD-10-CM | POA: Diagnosis not present

## 2019-11-23 DIAGNOSIS — G546 Phantom limb syndrome with pain: Secondary | ICD-10-CM | POA: Diagnosis not present

## 2019-11-23 LAB — GLUCOSE, CAPILLARY
Glucose-Capillary: 193 mg/dL — ABNORMAL HIGH (ref 70–99)
Glucose-Capillary: 211 mg/dL — ABNORMAL HIGH (ref 70–99)
Glucose-Capillary: 211 mg/dL — ABNORMAL HIGH (ref 70–99)
Glucose-Capillary: 152 mg/dL — ABNORMAL HIGH (ref 70–99)

## 2019-11-23 LAB — HEPATITIS B SURFACE ANTIBODY, QUANTITATIVE: Hep B S AB Quant (Post): <3.1 m[IU]/mL — ABNORMAL LOW (ref >9.9)

## 2019-11-23 NOTE — TOC Progression Note (Signed)
Transition of Care [facility]) - Progression Note    Patient Details  Name: Zachary Young MRN: [id number] Date of Birth: 1960-12-07  Transition of Care [facility]) CM/SW [place], [place] Phone Number: 11/23/2019, 4:19 PM  Clinical Narrative:    CSW spoke with pt at bedside and pt sister [name] via telephone. We discussed current limitations as we have no placement offers. Pt verbally frustrated by lack of placement options. Pt sister states understanding and has done calls of her own to multiple counties with no offers/options available. TOC team leadership aware and aware of difficulties with finding placement given social barriers as well as care needs for dialysis and mobility wise s/p BKA.    Expected Discharge Plan: Skilled Nursing Facility Barriers to Discharge: Continued Medical Work up, No SNF bed, Homeless with medical needs, Waiting for outpatient dialysis  Expected Discharge Plan and Services Expected Discharge Plan: [place] In-house Referral: Clinical Social Work Discharge Planning Services: CM Consult Post Acute Care Choice: [place], Dialysis Living arrangements for the past 2 months: No permanent address               Readmission Risk Interventions Readmission Risk Prevention Plan 11/12/2019  Transportation Screening Complete  Medication Review [employer]) Referral to Pharmacy  PCP or Specialist appointment within 3-5 days of discharge Not Complete  PCP/Specialist Appt Not Complete comments plan for SNF; no PCP  HRI or Home Care Consult Complete  SW Recovery Care/Counseling Consult Complete  Palliative Care Screening Complete  Skilled Nursing Facility Complete  Some recent data might be hidden

## 2019-11-23 NOTE — Progress Notes (Signed)
Patient refusing for me to set the bed alarm and refusing to let me look at his incision.

## 2019-11-23 NOTE — Progress Notes (Signed)
  [facility] Progress Note   59 y.o. yo male who was admitted on 10/17/2019 with new start to dialysis due to ESRD    Assessment/ Plan:   1. New ESRD-  Has started HD on 7/23.   Has TDC and AVF placed 7/30.  Currently on TTS schedule, will be due next on Tuesday.   CLIP in process but disposition is unknown at this time- many social issues "unable to procure appropriate housing- registered sex offender - family members who refuse to take him back"  -Plan next HD tomorrow   2. HTN/volume-  BP good/even low no BP meds -  Has IDH-  try some midodrine 3. Anemia- climbing slowly -  darbe 150 and iron  4. Secondary hyperparathyroidism- calcium/phos OK- no meds- PTH 22- have loosened diet a little in an attempt to improve his mood    Subjective:   Denies f/c/n/v/sob   Objective:   BP (!) 152/77 (BP Location: Right Arm)   Pulse 72   Temp 97.9 F (36.6 C) (Oral)   Resp 16   Ht '6\' 3"'$  (1.905 m)   Wt 126.4 kg   SpO2 100%   BMI 34.83 kg/m   Intake/Output Summary (Last 24 hours) at 11/23/2019 1309 Last data filed at 11/23/2019 0900 Gross per 24 hour  Intake 590 ml  Output 0 ml  Net 590 ml   Weight change:   Physical Exam: General: on HD -  Mostly calm Heart: RRR Lungs: mostly clear Abdomen: soft, non tender Extremities: min edema, left TMA, rt BKA Dialysis Access: TDC plus AVF on left-  Good thrill  (BBT not transposed)  Imaging: No results found.  Labs: BMET Recent Labs  Lab 11/17/19 0727 11/19/19 0629 11/21/19 0809  NA 134* 134* 134*  K 4.4 4.8 4.3  CL 101 98 99  CO2 '25 24 25  '$ GLUCOSE 136* 225* 231*  BUN 55* 47* 35*  CREATININE 4.14* 4.37* 4.35*  CALCIUM 9.4 8.7* 8.4*  PHOS 4.2 3.1 3.7   CBC Recent Labs  Lab 11/17/19 0727 11/19/19 0629 11/21/19 0809  WBC 7.4 9.6 8.7  HGB 9.1* 9.4* 8.9*  HCT 29.9* 30.4* 29.9*  MCV 84.5 85.9 87.7  PLT 232 237 248    Medications:    . aspirin EC  81 mg Oral Daily  . Chlorhexidine Gluconate Cloth  6 each  Topical Q0600  . Chlorhexidine Gluconate Cloth  6 each Topical Q0600  . Chlorhexidine Gluconate Cloth  6 each Topical Q0600  . Chlorhexidine Gluconate Cloth  6 each Topical Q0600  . collagenase   Topical Daily  . darbepoetin (ARANESP) injection - DIALYSIS  150 mcg Intravenous Q Tue-HD  . heparin injection (subcutaneous)  5,000 Units Subcutaneous Q8H  . hydrocerin   Topical BID  . insulin aspart  0-9 Units Subcutaneous TID WC  . insulin aspart  4 Units Subcutaneous TID WC  . insulin glargine  15 Units Subcutaneous QHS  . lidocaine  1 patch Transdermal Q24H  . loperamide  2 mg Oral Once  . midodrine  10 mg Oral TID WC  . multivitamin with minerals  1 tablet Oral Daily  . pregabalin  50 mg Oral BID  . prochlorperazine  10 mg Intravenous QHS  . Ensure Max Protein  11 oz Oral BID  . sodium chloride flush  3 mL Intravenous Q12H      [name], MD 11/23/2019, 1:09 PM

## 2019-11-23 NOTE — Progress Notes (Signed)
PT Cancellation Note  Patient Details Name: Zachary Young MRN: [id number] DOB: 1960-12-13   Cancelled Treatment:    Reason Eval/Treat Not Completed: Patient declined, no reason specified. Upon arrival of PT, the pt repeatedly stated "you're late!" and that he was no longer willing to get out of bed or mobilize as he had been up with RN staff earlier in the day. The pt did state he would like to work on transfers to a [facility], but when offered this afternoon he continued to state he was unable this afternoon due to fatigue. PT will continue to follow and attempt to treat as pt allows.   Karma Ganja, PT, DPT   Acute Rehabilitation Department Pager #: [phone]   Otho Bellows 11/23/2019, 5:05 PM
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
## 2019-11-24 DIAGNOSIS — G546 Phantom limb syndrome with pain: Secondary | ICD-10-CM | POA: Diagnosis not present

## 2019-11-24 DIAGNOSIS — Z87898 Personal history of other specified conditions: Secondary | ICD-10-CM | POA: Diagnosis not present

## 2019-11-24 DIAGNOSIS — Z992 Dependence on renal dialysis: Secondary | ICD-10-CM | POA: Diagnosis not present

## 2019-11-24 DIAGNOSIS — N186 End stage renal disease: Secondary | ICD-10-CM | POA: Diagnosis not present

## 2019-11-24 LAB — RENAL FUNCTION PANEL
Albumin: 2.7 g/dL — ABNORMAL LOW (ref 3.5–5.0)
Anion gap: 15 (ref 5–15)
BUN: 40 mg/dL — ABNORMAL HIGH (ref 6–20)
CO2: 22 mmol/L (ref 22–32)
Calcium: 7.8 mg/dL — ABNORMAL LOW (ref 8.9–10.3)
Chloride: 102 mmol/L (ref 98–111)
Creatinine, Ser: 4.75 mg/dL — ABNORMAL HIGH (ref 0.61–1.24)
GFR calc Af Amer: 15 mL/min — ABNORMAL LOW
GFR calc non Af Amer: 13 mL/min — ABNORMAL LOW
Glucose, Bld: 202 mg/dL — ABNORMAL HIGH (ref 70–99)
Phosphorus: 5.3 mg/dL — ABNORMAL HIGH (ref 2.5–4.6)
Potassium: 4.3 mmol/L (ref 3.5–5.1)
Sodium: 139 mmol/L (ref 135–145)

## 2019-11-24 LAB — CBC
HCT: 30.3 % — ABNORMAL LOW (ref 39.0–52.0)
Hemoglobin: 9 g/dL — ABNORMAL LOW (ref 13.0–17.0)
MCH: 26.3 pg (ref 26.0–34.0)
MCHC: 29.7 g/dL — ABNORMAL LOW (ref 30.0–36.0)
MCV: 88.6 fL (ref 80.0–100.0)
Platelets: 255 10*3/uL (ref 150–400)
RBC: 3.42 MIL/uL — ABNORMAL LOW (ref 4.22–5.81)
RDW: 17.8 % — ABNORMAL HIGH (ref 11.5–15.5)
WBC: 10.8 10*3/uL — ABNORMAL HIGH (ref 4.0–10.5)
nRBC: 0 % (ref 0.0–0.2)

## 2019-11-24 LAB — GLUCOSE, CAPILLARY
Glucose-Capillary: 157 mg/dL — ABNORMAL HIGH (ref 70–99)
Glucose-Capillary: 128 mg/dL — ABNORMAL HIGH (ref 70–99)
Glucose-Capillary: 185 mg/dL — ABNORMAL HIGH (ref 70–99)
Glucose-Capillary: 157 mg/dL — ABNORMAL HIGH (ref 70–99)

## 2019-11-24 MED ORDER — DARBEPOETIN ALFA 150 MCG/0.3ML IJ SOSY
PREFILLED_SYRINGE | INTRAMUSCULAR | Status: AC
  Administered 2019-11-24: 150 ug via INTRAVENOUS
  Filled 2019-11-24: qty 0.3

## 2019-11-24 MED ORDER — MIDODRINE HCL 5 MG PO TABS
ORAL_TABLET | ORAL | Status: AC
  Administered 2019-11-24: 10 mg via ORAL
  Filled 2019-11-24: qty 2

## 2019-11-24 NOTE — Progress Notes (Signed)
PT Cancellation Note  Patient Details Name: Zachary Young MRN: [id number] DOB: [birth year]   Cancelled Treatment:    Reason Eval/Treat Not Completed: Patient at procedure or test/unavailable (HD). Will attempt to follow-up later today as schedule permits, especially since pt did not participate in PT treatment yesterday.  Mabeline Caras, PT, DPT Acute Rehabilitation Services  Pager [phone] Office [place] 11/24/2019, 8:12 AM

## 2019-11-24 NOTE — Progress Notes (Signed)
Nutrition Follow-up  DOCUMENTATION CODES:   Obesity unspecified  INTERVENTION:  ContinueEnsure Max poBID, each supplement provides 150 kcal and 30 grams of protein.  Encourage adequate PO intake.   NUTRITION DIAGNOSIS:   Increased nutrient needs related to wound healing as evidenced by estimated needs; ongoing  GOAL:   Patient will meet greater than or equal to 90% of their needs; met  MONITOR:   PO intake, Supplement acceptance, Skin, Labs, Weight trends, I & O's  REASON FOR ASSESSMENT:   Consult Wound healing  ASSESSMENT:   Pt presented with wound of R foot and hyperglycemia. PMH significant for kidney disease, T2DM, CHF, HTN, diabetic neuropathy  7/7 - s/p Right BKA. 7/22 - Right IJ approachtunneled HD catheterplaced 7/23 - HD initiated 7/30 - Left brachiobasilic AV fistula creation   Meal completion has been 100%. Pt undergoing HD at this time. Pt currently has Ensure Max ordered and has been consuming them. RD to continue with current orders to aid in increased caloric and protein needs. CLIP process ongoing.   Labs and medications reviewed. Phosphorous elevated at 5.3.  Diet Order:   Diet Order            Diet 2 gram sodium Room service appropriate? Yes; Fluid consistency: Thin; Fluid restriction: 1500 mL Fluid  Diet effective now                 EDUCATION NEEDS:   No education needs have been identified at this time  Skin:  Skin Assessment: Reviewed RN Assessment Skin Integrity Issues:: Incisions Wound Vac: N/A Diabetic Ulcer: N/A Incisions: R leg Other: amputation - all toes  Last BM:  8/9  Height:   Ht Readings from Last 1 Encounters:  10/21/19 6' 3"  (1.905 m)    Weight:   Wt Readings from Last 1 Encounters:  11/24/19 128.9 kg   BMI:  Body mass index is 35.52 kg/m.  Estimated Nutritional Needs:   Kcal:  2400-2600  Protein:  130-140 grams  Fluid:  >2L/d  Corrin Parker, MS, RD, LDN RD pager number/after hours  weekend pager number on Amion.

## 2019-11-24 NOTE — Progress Notes (Signed)
  [facility] Progress Note   59 y.o.yo malewho was admitted on 7/3/2021with new start to dialysis due to ESRD   Assessment/ Plan:   1. New ESRD- Has started HD on 7/23. Has TDC and AVF placed 7/30. Currently on TTS schedule, will be due next on Tuesday. CLIP in process but disposition is unknown at this time- many social issues "unable to procure appropriate housing- registered sex offender - family members who refuse to take him back"  Seen on HD 93/71 RIJ TC  2K bath  2.5L (net 2L) and tolerating  -Plan next HD Thursday  2. HTN/volume- BP good/even low no BP meds - Has IDH- try some midodrine 3. Anemia- climbing slowly - darbe 150 and iron  4. Secondary hyperparathyroidism- calcium/phos OK- no meds- PTH 22- have loosened diet a little in an attempt to improve his mood  Subjective:   Denies f/c/n/v/sob Tolerating HD   Objective:   BP 108/78   Pulse 79   Temp (!) 97.5 F (36.4 C) (Oral)   Resp 16   Ht '6\' 3"'$  (1.905 m)   Wt 126.4 kg   SpO2 100%   BMI 34.83 kg/m   Intake/Output Summary (Last 24 hours) at 11/24/2019 0916 Last data filed at 11/24/2019 0630 Gross per 24 hour  Intake 560 ml  Output 601 ml  Net -41 ml   Weight change:   Physical Exam: General:on HD- Mostly calm, supine, somnolent but easily arrousable Heart: RRR Lungs: mostly clear Abdomen: soft, non tender Extremities: min edema, left TMA, rt BKA Dialysis Access: TDC plus AVF on left- Good thrill (BBT not transposed)  Imaging: No results found.  Labs: BMET Recent Labs  Lab 11/19/19 0629 11/21/19 0809 11/24/19 0743  NA 134* 134* 139  K 4.8 4.3 4.3  CL 98 99 102  CO2 '24 25 22  '$ GLUCOSE 225* 231* 202*  BUN 47* 35* 40*  CREATININE 4.37* 4.35* 4.75*  CALCIUM 8.7* 8.4* 7.8*  PHOS 3.1 3.7 5.3*   CBC Recent Labs  Lab 11/19/19 0629 11/21/19 0809 11/24/19 0743  WBC 9.6 8.7 10.8*  HGB 9.4* 8.9* 9.0*  HCT 30.4* 29.9* 30.3*  MCV 85.9 87.7 88.6  PLT 237 248  255    Medications:    . aspirin EC  81 mg Oral Daily  . Chlorhexidine Gluconate Cloth  6 each Topical Q0600  . Chlorhexidine Gluconate Cloth  6 each Topical Q0600  . Chlorhexidine Gluconate Cloth  6 each Topical Q0600  . Chlorhexidine Gluconate Cloth  6 each Topical Q0600  . collagenase   Topical Daily  . darbepoetin (ARANESP) injection - DIALYSIS  150 mcg Intravenous Q Tue-HD  . heparin injection (subcutaneous)  5,000 Units Subcutaneous Q8H  . hydrocerin   Topical BID  . insulin aspart  0-9 Units Subcutaneous TID WC  . insulin aspart  4 Units Subcutaneous TID WC  . insulin glargine  15 Units Subcutaneous QHS  . lidocaine  1 patch Transdermal Q24H  . loperamide  2 mg Oral Once  . midodrine  10 mg Oral TID WC  . multivitamin with minerals  1 tablet Oral Daily  . pregabalin  50 mg Oral BID  . prochlorperazine  10 mg Intravenous QHS  . Ensure Max Protein  11 oz Oral BID  . sodium chloride flush  3 mL Intravenous Q12H      [name], MD 11/24/2019, 9:16 AM
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
## 2019-11-25 ENCOUNTER — Inpatient Hospital Stay (HOSPITAL_COMMUNITY): Payer: Medicaid Other

## 2019-11-25 DIAGNOSIS — Z89511 Acquired absence of right leg below knee: Secondary | ICD-10-CM | POA: Diagnosis not present

## 2019-11-25 DIAGNOSIS — Z992 Dependence on renal dialysis: Secondary | ICD-10-CM | POA: Diagnosis not present

## 2019-11-25 DIAGNOSIS — Z87898 Personal history of other specified conditions: Secondary | ICD-10-CM | POA: Diagnosis not present

## 2019-11-25 DIAGNOSIS — N186 End stage renal disease: Secondary | ICD-10-CM | POA: Diagnosis not present

## 2019-11-25 LAB — GLUCOSE, CAPILLARY
Glucose-Capillary: 111 mg/dL — ABNORMAL HIGH (ref 70–99)
Glucose-Capillary: 186 mg/dL — ABNORMAL HIGH (ref 70–99)
Glucose-Capillary: 178 mg/dL — ABNORMAL HIGH (ref 70–99)
Glucose-Capillary: 203 mg/dL — ABNORMAL HIGH (ref 70–99)

## 2019-11-25 MED ORDER — POLYETHYLENE GLYCOL 3350 17 G PO PACK
17.0000 g | PACK | Freq: Every day | ORAL | Status: DC
  Administered 2019-11-25 – 2019-11-27 (×3): 17 g via ORAL
  Filled 2019-11-25 (×6): qty 1

## 2019-11-25 MED ORDER — LACTULOSE 10 GM/15ML PO SOLN: 10.0000 g | Freq: Every day | ORAL | Status: DC | PRN

## 2019-11-25 NOTE — TOC Progression Note (Addendum)
Transition of Care [facility]) - Progression Note    Patient Details  Name: Zachary Young MRN: [id number] Date of Birth: 18-Feb-1961  Transition of Care [facility]) CM/SW [place], [place] Phone Number: 11/25/2019, 10:47 AM  Clinical Narrative:    11:07am- Per Financial Counseling pt Medicaid is for recipients receiving SSI, pt should be getting a check, we will f/u with family as able as to how much this is. I will let MD/NP team knonw as well.  10:47am- TOC team has reached out to [facility] and [place] as Ebony Hail, NP mentioned that pt family had stated that perhaps pt is not on disability (needed for placement also). CSW has requested if possible for pt to be referred to [facility] to see if pt is receiving disability/eligible otherwise.    Expected Discharge Plan: Skilled Nursing Facility Barriers to Discharge: Continued Medical Work up, No SNF bed, Homeless with medical needs, Waiting for outpatient dialysis  Expected Discharge Plan and Services Expected Discharge Plan: [place] In-house Referral: Clinical Social Work Discharge Planning Services: CM Consult Post Acute Care Choice: [place], Dialysis Living arrangements for the past 2 months: No permanent address                 Readmission Risk Interventions Readmission Risk Prevention Plan 11/12/2019  Transportation Screening Complete  Medication Review [employer]) Referral to Pharmacy  PCP or Specialist appointment within 3-5 days of discharge Not Complete  PCP/Specialist Appt Not Complete comments plan for SNF; no PCP  HRI or Home Care Consult Complete  SW Recovery Care/Counseling Consult Complete  Palliative Care Screening Complete  Skilled Nursing Facility Complete  Some recent data might be hidden

## 2019-11-25 NOTE — Progress Notes (Signed)
[name] Progress Note   59 y.o.yo malewho was admitted on 7/3/2021with new start to dialysis due to ESRD  Assessment/ Plan:   1. New ESRD- Has started HD on 7/23. Has TDC and AVF placed 7/30. Currently on TTS schedule, will be due next on Tuesday. CLIP in process but disposition is unknown at this time- many social issues "unable to procure appropriate housing- registered sex offender - family members who refuse to take him back"  Last  HD yesterday through [facility]  No c/o cramping  -Plan next HD Thursday  2. HTN/volume- BP good/even low no BP meds - Has IDH- try some midodrine 3. Anemia- climbing slowly - darbe 150 and iron  4. Secondary hyperparathyroidism- calcium/phos OK- no meds- PTH 22- have loosened diet a little in an attempt to improve his mood  - will start phoslo 1 tab TIDM (8/11);  with improved appetite phos starting to incr  Subjective:   Denies f/c/n/v/sob Good appetite   Objective:   BP (!) 154/65 (BP Location: Right Arm)   Pulse 72   Temp 98.9 F (37.2 C)   Resp 16   Ht _0  (1.905 m)   Wt 127.1 kg   SpO2 97%   BMI 35.02 kg/m   Intake/Output Summary (Last 24 hours) at 11/25/2019 1204 Last data filed at 11/25/2019 0945 Gross per 24 hour  Intake 720 ml  Output 250 ml  Net 470 ml   Weight change:   Physical Exam: General:on HD- Mostly calm, supine, somnolent but easily arrousable Heart: RRR Lungs: mostly clear Abdomen: soft, non tender Extremities: min edema, left TMA, rt BKA Dialysis Access: TDC plus AVF on left- Good thrill(BBT not transposed)  Imaging: DG Abd 1 View  Result Date: 11/25/2019 CLINICAL DATA:  Constipation EXAM: ABDOMEN - 1 VIEW COMPARISON:  None. FINDINGS: Nonobstructive bowel gas pattern. Moderate left colonic stool burden. Visualized osseous structures are within normal limits. IMPRESSION: Moderate left colonic stool burden, suggesting mild constipation. Electronically Signed   By:  Zachary Young M.D.   On: 11/25/2019 09:51    Labs: BMET Recent Labs  Lab 11/19/19 0629 11/21/19 0809 11/24/19 0743  NA 134* 134* 139  K 4.8 4.3 4.3  CL 98 99 102  CO2 _1 GLUCOSE 225* 231* 202*  BUN 47* 35* 40*  CREATININE 4.37* 4.35* 4.75*  CALCIUM 8.7* 8.4* 7.8*  PHOS 3.1 3.7 5.3*   CBC Recent Labs  Lab 11/19/19 0629 11/21/19 0809 11/24/19 0743  WBC 9.6 8.7 10.8*  HGB 9.4* 8.9* 9.0*  HCT 30.4* 29.9* 30.3*  MCV 85.9 87.7 88.6  PLT 237 248 255    Medications:    . aspirin EC  81 mg Oral Daily  . Chlorhexidine Gluconate Cloth  6 each Topical Q0600  . Chlorhexidine Gluconate Cloth  6 each Topical Q0600  . Chlorhexidine Gluconate Cloth  6 each Topical Q0600  . Chlorhexidine Gluconate Cloth  6 each Topical Q0600  . collagenase   Topical Daily  . darbepoetin (ARANESP) injection - DIALYSIS  150 mcg Intravenous Q Tue-HD  . heparin injection (subcutaneous)  5,000 Units Subcutaneous Q8H  . hydrocerin   Topical BID  . insulin aspart  0-9 Units Subcutaneous TID WC  . insulin aspart  4 Units Subcutaneous TID WC  . insulin glargine  15 Units Subcutaneous QHS  . lidocaine  1 patch Transdermal Q24H  . midodrine  10 mg Oral TID WC  . multivitamin with minerals  1 tablet Oral  Daily  . polyethylene glycol  17 g Oral QHS  . pregabalin  50 mg Oral BID  . prochlorperazine  10 mg Intravenous QHS  . Ensure Max Protein  11 oz Oral BID  . sodium chloride flush  3 mL Intravenous Q12H      [name], MD 11/25/2019, 12:04 PM

## 2019-11-25 NOTE — TOC Progression Note (Addendum)
Transition of Care [facility]) - Progression Note    Patient Details  Name: Alekzander Cardell MRN: [id number] Date of Birth: 1960/09/05  Transition of Care [facility]) CM/SW [place],  Phone Number: 11/25/2019, 10:47 AM  Clinical Narrative:    11:07am- Per Financial Counseling pt Medicaid is for recipients receiving SSI, pt should be getting a check, we will f/u with family as able as to how much this is. I will let MD/NP team knonw as well.  10:47am- TOC team has reached out to [facility] and [place] as Ebony Hail, NP mentioned that pt family had stated that perhaps pt is not on disability (needed for placement also). CSW has requested if possible for pt to be referred to [facility] to see if pt is receiving disability/eligible otherwise.    Expected Discharge Plan: Skilled Nursing Facility Barriers to Discharge: Continued Medical Work up, No SNF bed, Homeless with medical needs, Waiting for outpatient dialysis  Expected Discharge Plan and Services Expected Discharge Plan: [place] In-house Referral: Clinical Social Work Discharge Planning Services: CM Consult Post Acute Care Choice: [place], Dialysis Living arrangements for the past 2 months: No permanent address                 Readmission Risk Interventions Readmission Risk Prevention Plan 11/12/2019  Transportation Screening Complete  Medication Review [employer] photographer) Referral to Pharmacy  PCP or Specialist appointment within 3-5 days of discharge Not Complete  PCP/Specialist Appt Not Complete comments plan for SNF; no PCP  HRI or Home Care Consult Complete  SW Recovery Care/Counseling Consult Complete  Palliative Care Screening Complete  Skilled Nursing Facility Complete  Some recent data might be hidden

## 2019-11-25 NOTE — Progress Notes (Signed)
Physical Therapy Treatment Patient Details Name: Zachary Young MRN: [id number] DOB: 1960-06-28 Today's Date: 11/25/2019    History of Present Illness Pt is a 59 y/o male s/p R transtibial amputation. PMH including but not limited to CHF, DM, HTN, L transmet amputation. Pt now s/p HD catheter placement with plan for starting HD while admitted.     PT Comments    Pt progressing well with mobility. Cooperative during today's session. He required min guard assist (+2 safety) for lateral scoot transfers bed<>w/c, sit to stand with RW, and ambulation with RW 15'. He demonstrates modified independence with bed mobility. He performed RLE exercises in bed prior to mobilization. Pt in recliner at end of session with R limb protector in place.    Follow Up Recommendations  SNF     Equipment Recommendations  Wheelchair (measurements PT);Wheelchair cushion (measurements PT);Other (comment) (with amputee attachment)    Recommendations for Other Services       Precautions / Restrictions Precautions Precautions: Fall Required Braces or Orthoses: Other Brace Other Brace: R limb protector Restrictions RLE Weight Bearing: Non weight bearing    Mobility  Bed Mobility Overal bed mobility: Modified Independent                Transfers Overall transfer level: Needs assistance Equipment used: Ambulation equipment used Transfers: Lateral/Scoot Transfers;Sit to/from Stand Sit to Stand: Min guard;+2 safety/equipment        Lateral/Scoot Transfers: Min guard;+2 safety/equipment General transfer comment: lateral scoot transfer bed <> w/c, sit to stand with RW from EOB  Ambulation/Gait Ambulation/Gait assistance: +2 safety/equipment;Min guard Gait Distance (Feet): 15 Feet Assistive device: Rolling walker (2 wheeled) Gait Pattern/deviations: Step-to pattern Gait velocity: decreased Gait velocity interpretation: <1.31 ft/sec, indicative of household Nurse, children's Rankin (Stroke Patients Only)       Balance Overall balance assessment: Needs assistance Sitting-balance support: No upper extremity supported;Feet supported Sitting balance-Leahy Scale: Good     Standing balance support: Bilateral upper extremity supported;During functional activity Standing balance-Leahy Scale: Poor Standing balance comment: reliant on BUE support                            Cognition Arousal/Alertness: Awake/alert Behavior During Therapy: WFL for tasks assessed/performed;Agitated Overall Cognitive Status: Impaired/Different from baseline Area of Impairment: Safety/judgement;Problem solving;Memory                     Memory: Decreased short-term memory   Safety/Judgement: Decreased awareness of safety;Decreased awareness of deficits   Problem Solving: Requires verbal cues;Requires tactile cues General Comments: easily agitated      Exercises Amputee Exercises Quad Sets: AROM;Right;10 reps;Supine Knee Flexion: AROM;Right;10 reps;Supine Knee Extension: AROM;Right;10 reps;Supine    General Comments        Pertinent Vitals/Pain Pain Assessment: No/denies pain    Home Living                      Prior Function            PT Goals (current goals can now be found in the care plan section) Acute Rehab PT Goals Patient Stated Goal: home Progress towards PT goals: Progressing toward goals    Frequency    Min 3X/week      PT Plan Current plan remains appropriate    Co-evaluation  AM-PAC PT "6 Clicks" Mobility   Outcome Measure  Help needed turning from your back to your side while in a flat bed without using bedrails?: None Help needed moving from lying on your back to sitting on the side of a flat bed without using bedrails?: None Help needed moving to and from a bed to a chair (including a wheelchair)?: A Little Help needed standing up from a chair  using your arms (e.g., wheelchair or bedside chair)?: A Little Help needed to walk in hospital room?: A Lot Help needed climbing 3-5 steps with a railing? : Total 6 Click Score: 17    End of Session Equipment Utilized During Treatment: Gait belt;Other (comment) (R limb protector) Activity Tolerance: Patient tolerated treatment well Patient left: in chair;with call bell/phone within reach (Pt refusing chair alarm.) Nurse Communication: Mobility status PT Visit Diagnosis: Other abnormalities of gait and mobility (R26.89)     Time: 2091-9802 PT Time Calculation (min) (ACUTE ONLY): 26 min  Charges:  $Gait Training: 8-22 mins $Therapeutic Activity: 8-22 mins                     Lorrin Goodell, PT  Office # [phone] Pager [phone]    Lorriane Shire 11/25/2019, 11:11 AM
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
## 2019-11-26 DIAGNOSIS — Z87898 Personal history of other specified conditions: Secondary | ICD-10-CM | POA: Diagnosis not present

## 2019-11-26 DIAGNOSIS — N186 End stage renal disease: Secondary | ICD-10-CM | POA: Diagnosis not present

## 2019-11-26 DIAGNOSIS — Z89511 Acquired absence of right leg below knee: Secondary | ICD-10-CM | POA: Diagnosis not present

## 2019-11-26 DIAGNOSIS — G546 Phantom limb syndrome with pain: Secondary | ICD-10-CM | POA: Diagnosis not present

## 2019-11-26 LAB — CBC
HCT: 30.7 % — ABNORMAL LOW (ref 39.0–52.0)
Hemoglobin: 9.2 g/dL — ABNORMAL LOW (ref 13.0–17.0)
MCH: 26.4 pg (ref 26.0–34.0)
MCHC: 30 g/dL (ref 30.0–36.0)
MCV: 88.2 fL (ref 80.0–100.0)
Platelets: 263 10*3/uL (ref 150–400)
RBC: 3.48 MIL/uL — ABNORMAL LOW (ref 4.22–5.81)
RDW: 18.3 % — ABNORMAL HIGH (ref 11.5–15.5)
WBC: 8.6 10*3/uL (ref 4.0–10.5)
nRBC: 0.5 % — ABNORMAL HIGH (ref 0.0–0.2)

## 2019-11-26 LAB — RENAL FUNCTION PANEL
Albumin: 2.7 g/dL — ABNORMAL LOW (ref 3.5–5.0)
Anion gap: 10 (ref 5–15)
BUN: 35 mg/dL — ABNORMAL HIGH (ref 6–20)
CO2: 26 mmol/L (ref 22–32)
Calcium: 9.4 mg/dL (ref 8.9–10.3)
Chloride: 101 mmol/L (ref 98–111)
Creatinine, Ser: 4.96 mg/dL — ABNORMAL HIGH (ref 0.61–1.24)
GFR calc Af Amer: 14 mL/min — ABNORMAL LOW (ref >60)
GFR calc non Af Amer: 12 mL/min — ABNORMAL LOW (ref >60)
Glucose, Bld: 185 mg/dL — ABNORMAL HIGH (ref 70–99)
Phosphorus: 5.8 mg/dL — ABNORMAL HIGH (ref 2.5–4.6)
Potassium: 4 mmol/L (ref 3.5–5.1)
Sodium: 137 mmol/L (ref 135–145)

## 2019-11-26 LAB — GLUCOSE, CAPILLARY
Glucose-Capillary: 116 mg/dL — ABNORMAL HIGH (ref 70–99)
Glucose-Capillary: 178 mg/dL — ABNORMAL HIGH (ref 70–99)
Glucose-Capillary: 129 mg/dL — ABNORMAL HIGH (ref 70–99)
Glucose-Capillary: 111 mg/dL — ABNORMAL HIGH (ref 70–99)
Glucose-Capillary: 208 mg/dL — ABNORMAL HIGH (ref 70–99)

## 2019-11-26 MED ORDER — HEPARIN SODIUM (PORCINE) 1000 UNIT/ML IJ SOLN
INTRAMUSCULAR | Status: AC
  Administered 2019-11-26: 1000 [IU]
  Filled 2019-11-26: qty 4

## 2019-11-26 MED ORDER — SEVELAMER CARBONATE 800 MG PO TABS
800.0000 mg | ORAL_TABLET | Freq: Three times a day (TID) | ORAL | Status: DC
  Administered 2019-11-26 – 2019-12-09 (×32): 800 mg via ORAL
  Filled 2019-11-26 (×34): qty 1

## 2019-11-26 NOTE — Progress Notes (Signed)
Physical Therapy Treatment Patient Details Name: Zachary Young MRN: [id number] DOB: [birth year] Today's Date: 11/26/2019    History of Present Illness Pt is a 59 y/o male s/p R transtibial amputation. PMH including but not limited to CHF, DM, HTN, L transmet amputation. Pt now s/p HD catheter placement with plan for starting HD while admitted.     PT Comments    The pt was in bed upon arrival of PT, but agreeable to session with focus on safety and mobility with use of a WC. The pt was able to demo good transfer without assist, but benefited initially from cues for safety, locking of the WC, and positioning. The pt was then able to self-propel 500 ft in the hallway, but reports slight shoulder stiffeness with continued mobility. The pt demos good navigation in the [facility] and was able to avoid obstacles and complete tight turns without assist or cues for technique. At the end of the therapy session, the NP arrived and discussed a new d/c plan of d/c to an apartment rather than a SNF. At this time the pt would still require 24/7 assist of a skilled facility for safety. However, I am aware his d/c placement has been difficult, so PT will continue to follow and work towards mobility needed for safe d/c to an apt.    Follow Up Recommendations  SNF     Equipment Recommendations  Wheelchair (measurements PT);Wheelchair cushion (measurements PT);Other (comment) (with amputee attachment)    Recommendations for Other Services       Precautions / Restrictions Precautions Precautions: Fall Precaution Comments: R limb protector Required Braces or Orthoses: Other Brace Other Brace: R limb protector Restrictions Weight Bearing Restrictions: Yes RLE Weight Bearing: Non weight bearing Other Position/Activity Restrictions: NWB R LE    Mobility  Bed Mobility Overal bed mobility: Modified Independent             General bed mobility comments: pt able to come to sitting EOB without assist, minimal  use of bed rails  Transfers Overall transfer level: Needs assistance Equipment used: None Transfers: Lateral/Scoot Transfers          Lateral/Scoot Transfers: Min guard General transfer comment: lateral scoot transfer bed <> w/c without assist. did check for locks and adjust positioning  Ambulation/Gait Ambulation/Gait assistance:  (pt completed mobility in [facility] today)               Theme park manager mobility: Yes Wheelchair propulsion: Both upper extremities Wheelchair parts: Supervision/cueing Distance: 500 ft Wheelchair Assistance Details (indicate cue type and reason): cues for safety, locks. pt able to navigate well in hallway and avoid obstacles.  Modified Rankin (Stroke Patients Only)       Balance Overall balance assessment: Needs assistance Sitting-balance support: No upper extremity supported;Feet supported Sitting balance-Leahy Scale: Good Sitting balance - Comments: pt sitting EOB without UE support   Standing balance support: Bilateral upper extremity supported;During functional activity Standing balance-Leahy Scale: Poor Standing balance comment: reliant on BUE support                            Cognition Arousal/Alertness: Awake/alert Behavior During Therapy: WFL for tasks assessed/performed;Agitated Overall Cognitive Status: Impaired/Different from baseline Area of Impairment: Safety/judgement;Problem solving;Memory                     Memory: Decreased short-term memory  Safety/Judgement: Decreased awareness of safety;Decreased awareness of deficits   Problem Solving: Requires verbal cues;Requires tactile cues General Comments: easily agitated      Exercises      General Comments General comments (skin integrity, edema, etc.): Pt agreeable to therapy today, able to use WC to mobilize in hallway and enjoyed going to the hallay to the [place]. He reports  slight shoulder stiffness with prolonged WC mobility      Pertinent Vitals/Pain Pain Assessment: No/denies pain Pain Intervention(s): Monitored during session;Repositioned           PT Goals (current goals can now be found in the care plan section) Acute Rehab PT Goals Patient Stated Goal: home PT Goal Formulation: With patient Time For Goal Achievement: 12/02/19 Potential to Achieve Goals: Fair Progress towards PT goals: Progressing toward goals    Frequency    Min 3X/week      PT Plan Current plan remains appropriate       AM-PAC PT "6 Clicks" Mobility   Outcome Measure  Help needed turning from your back to your side while in a flat bed without using bedrails?: None Help needed moving from lying on your back to sitting on the side of a flat bed without using bedrails?: None Help needed moving to and from a bed to a chair (including a wheelchair)?: None Help needed standing up from a chair using your arms (e.g., wheelchair or bedside chair)?: A Little Help needed to walk in hospital room?: A Lot Help needed climbing 3-5 steps with a railing? : Total 6 Click Score: 18    End of Session Equipment Utilized During Treatment: Gait belt;Other (comment) (R limb protector) Activity Tolerance: Patient tolerated treatment well Patient left: with call bell/phone within reach;in bed (sitting EOB with NP present) Nurse Communication: Mobility status PT Visit Diagnosis: Other abnormalities of gait and mobility (R26.89) Pain - Right/Left: Right Pain - part of body: Leg     Time: 6283-1517 PT Time Calculation (min) (ACUTE ONLY): 17 min  Charges:  $Wheel Chair Management: 8-22 mins                     Karma Ganja, PT, DPT   Acute Rehabilitation Department Pager #: [phone]   Otho Bellows 11/26/2019, 10:31 AM

## 2019-11-26 NOTE — Progress Notes (Signed)
  [facility] Progress Note   59 y.o.yo malewho was admitted on 7/3/2021with new start to dialysis due to ESRD  Assessment/ Plan:   1. New ESRD- Has started HD on 7/23. Has TDC and AVF placed 7/30. Currently on TTS schedule, will be due next on Tuesday. CLIP in process but disposition is unknown at this time- many social issues "unable to procure appropriate housing- registered sex offender - family members who refuse to take him back"  Last  HD on Tues  On for  HD today  2. HTN/volume- BP good/even low no BP meds - Has IDH- try some midodrine 3. Anemia- climbing slowly - darbe 150 and iron  4. Secondary hyperparathyroidism- calcium/phos OK- no meds- PTH 22- have loosened diet a little in an attempt to improve his mood. Starting renvela 1 TIDM on 8/12.  Subjective:   Denies f/c/n/v/sob Very good appetite. No events overnight   Objective:   BP (!) 164/75 (BP Location: Right Arm)   Pulse 71   Temp 98 F (36.7 C) (Oral)   Resp 18   Ht _0  (1.905 m)   Wt 126.7 kg   SpO2 99%   BMI 34.91 kg/m   Intake/Output Summary (Last 24 hours) at 11/26/2019 1139 Last data filed at 11/26/2019 1000 Gross per 24 hour  Intake 480 ml  Output 1150 ml  Net -670 ml   Weight change: -4.2 kg  Physical Exam: General:on HD- Mostly calm, supine eating Heart: RRR Lungs: mostly clear Abdomen: soft, non tender Extremities: min edema, left TMA, rt BKA Dialysis Access: TDC plus AVF on left- Good thrill(BBT not transposed)  Imaging: DG Abd 1 View  Result Date: 11/25/2019 CLINICAL DATA:  Constipation EXAM: ABDOMEN - 1 VIEW COMPARISON:  None. FINDINGS: Nonobstructive bowel gas pattern. Moderate left colonic stool burden. Visualized osseous structures are within normal limits. IMPRESSION: Moderate left colonic stool burden, suggesting mild constipation. Electronically Signed   By: Julian Hy M.D.   On: 11/25/2019 09:51    Labs: BMET Recent Labs  Lab  11/21/19 0809 11/24/19 0743  NA 134* 139  K 4.3 4.3  CL 99 102  CO2 25 22  GLUCOSE 231* 202*  BUN 35* 40*  CREATININE 4.35* 4.75*  CALCIUM 8.4* 7.8*  PHOS 3.7 5.3*   CBC Recent Labs  Lab 11/21/19 0809 11/24/19 0743  WBC 8.7 10.8*  HGB 8.9* 9.0*  HCT 29.9* 30.3*  MCV 87.7 88.6  PLT 248 255    Medications:    . aspirin EC  81 mg Oral Daily  . Chlorhexidine Gluconate Cloth  6 each Topical Q0600  . collagenase   Topical Daily  . darbepoetin (ARANESP) injection - DIALYSIS  150 mcg Intravenous Q Tue-HD  . heparin injection (subcutaneous)  5,000 Units Subcutaneous Q8H  . hydrocerin   Topical BID  . insulin aspart  0-9 Units Subcutaneous TID WC  . insulin aspart  4 Units Subcutaneous TID WC  . insulin glargine  15 Units Subcutaneous QHS  . lidocaine  1 patch Transdermal Q24H  . midodrine  10 mg Oral TID WC  . multivitamin with minerals  1 tablet Oral Daily  . polyethylene glycol  17 g Oral QHS  . pregabalin  50 mg Oral BID  . prochlorperazine  10 mg Intravenous QHS  . Ensure Max Protein  11 oz Oral BID  . sodium chloride flush  3 mL Intravenous Q12H      [name], MD 11/26/2019, 11:39 AM

## 2019-11-26 NOTE — Progress Notes (Signed)
Patient ID: Zachary Young, male   DOB: 1960-11-07, 59 y.o.   MRN: [id number] Examination of patient's right transtibial amputation shows some granulation tissue on the medial aspect that is approximately 1 cm in diameter.  The remainder of the wound is healing quite nicely there is no redness no cellulitis no signs of infection.  Would continue with the dry dressing change as needed continue with the stump shrinker.
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
## 2019-11-27 DIAGNOSIS — N186 End stage renal disease: Secondary | ICD-10-CM | POA: Diagnosis not present

## 2019-11-27 DIAGNOSIS — Z992 Dependence on renal dialysis: Secondary | ICD-10-CM | POA: Diagnosis not present

## 2019-11-27 DIAGNOSIS — Z89511 Acquired absence of right leg below knee: Secondary | ICD-10-CM | POA: Diagnosis not present

## 2019-11-27 DIAGNOSIS — Z87898 Personal history of other specified conditions: Secondary | ICD-10-CM | POA: Diagnosis not present

## 2019-11-27 DIAGNOSIS — L02611 Cutaneous abscess of right foot: Secondary | ICD-10-CM | POA: Diagnosis not present

## 2019-11-27 DIAGNOSIS — F1410 Cocaine abuse, uncomplicated: Secondary | ICD-10-CM | POA: Diagnosis not present

## 2019-11-27 LAB — GLUCOSE, CAPILLARY
Glucose-Capillary: 111 mg/dL — ABNORMAL HIGH (ref 70–99)
Glucose-Capillary: 178 mg/dL — ABNORMAL HIGH (ref 70–99)
Glucose-Capillary: 147 mg/dL — ABNORMAL HIGH (ref 70–99)
Glucose-Capillary: 186 mg/dL — ABNORMAL HIGH (ref 70–99)

## 2019-11-27 NOTE — Progress Notes (Signed)
  [facility] Progress Note   59 y.o.yo malewho was admitted on 7/3/2021with new start to dialysis due to ESRD  Assessment/ Plan:   1. New ESRD- Has started HD on 7/23. Has TDC and AVF placed 7/30. Currently on TTS schedule, will be due next on Tuesday. CLIP in process but disposition is unknown at this time- many social issues "unable to procure appropriate housing- registered sex offender - family members who refuse to take him back"  Last  HD on Thur  Next HD Sat (will try for 1st shift as pt requested)  2. HTN/volume- BP good/even low no BP meds - Has IDH- try some midodrine 3. Anemia- climbing slowly - darbe 150 and iron  4. Secondary hyperparathyroidism- calcium/phos OK- no meds- PTH 22- have loosened diet a little in an attempt to improve his mood. Starting renvela 1 TIDM on 8/12. Will recheck early  Next week.  Subjective:   Denies f/c/n/v/sob Very good appetite. No events overnight; requested to be 1st shift for HD tomorrow.   Objective:   BP (!) 146/70 (BP Location: Right Arm)   Pulse 74   Temp 97.9 F (36.6 C) (Oral)   Resp 16   Ht _0  (1.905 m)   Wt 127 kg   SpO2 99%   BMI 35.00 kg/m   Intake/Output Summary (Last 24 hours) at 11/27/2019 1446 Last data filed at 11/27/2019 1000 Gross per 24 hour  Intake 240 ml  Output 2000 ml  Net -1760 ml   Weight change: 2.3 kg  Physical Exam: General:on HD- Mostly calm, supine eating Heart: RRR Lungs: mostly clear Abdomen: soft, non tender Extremities: min edema, left TMA, rt BKA Dialysis Access: TDC plus AVF on left- Good thrill(BBT not transposed)  Imaging: No results found.  Labs: BMET Recent Labs  Lab 11/21/19 0809 11/24/19 0743 11/26/19 1437  NA 134* 139 137  K 4.3 4.3 4.0  CL 99 102 101  CO2 _1 GLUCOSE 231* 202* 185*  BUN 35* 40* 35*  CREATININE 4.35* 4.75* 4.96*  CALCIUM 8.4* 7.8* 9.4  PHOS 3.7 5.3* 5.8*   CBC Recent Labs  Lab 11/21/19 0809  11/24/19 0743 11/26/19 1437  WBC 8.7 10.8* 8.6  HGB 8.9* 9.0* 9.2*  HCT 29.9* 30.3* 30.7*  MCV 87.7 88.6 88.2  PLT 248 255 263    Medications:    . aspirin EC  81 mg Oral Daily  . Chlorhexidine Gluconate Cloth  6 each Topical Q0600  . collagenase   Topical Daily  . darbepoetin (ARANESP) injection - DIALYSIS  150 mcg Intravenous Q Tue-HD  . heparin injection (subcutaneous)  5,000 Units Subcutaneous Q8H  . hydrocerin   Topical BID  . insulin aspart  0-9 Units Subcutaneous TID WC  . insulin aspart  4 Units Subcutaneous TID WC  . insulin glargine  15 Units Subcutaneous QHS  . lidocaine  1 patch Transdermal Q24H  . midodrine  10 mg Oral TID WC  . multivitamin with minerals  1 tablet Oral Daily  . polyethylene glycol  17 g Oral QHS  . pregabalin  50 mg Oral BID  . prochlorperazine  10 mg Intravenous QHS  . Ensure Max Protein  11 oz Oral BID  . sevelamer carbonate  800 mg Oral TID WC  . sodium chloride flush  3 mL Intravenous Q12H      [name], MD 11/27/2019, 2:46 PM

## 2019-11-27 NOTE — Progress Notes (Signed)
Physical Therapy Treatment Patient Details Name: Zachary Young MRN: [id number] DOB: 10/03/1960 Today's Date: 11/27/2019    History of Present Illness Pt is a 59 y/o male s/p R transtibial amputation. PMH including but not limited to CHF, DM, HTN, L transmet amputation. Pt now s/p HD catheter placement with plan for starting HD while admitted.     PT Comments    Pt in bed upon arrival of PT and eager to participate in therapy session with focus on progressing independence with WC mobility. The pt was able to travel longer distances with self-propulsion without need for rest breaks. The pt was able to demo improved awareness for safety regarding WC mobility and transfers, but will continue to benefit from skilled PT to progress capacity for short bouts of ambulation and dynamic stability to facilitate safe d/c.     Follow Up Recommendations  SNF     Equipment Recommendations  Wheelchair (measurements PT);Wheelchair cushion (measurements PT);Other (comment) (amputee attachment)    Recommendations for Other Services       Precautions / Restrictions Precautions Precautions: Fall Precaution Comments: R limb protector Required Braces or Orthoses: Other Brace Other Brace: R limb protector Restrictions Weight Bearing Restrictions: Yes RLE Weight Bearing: Non weight bearing Other Position/Activity Restrictions: NWB R LE    Mobility  Bed Mobility Overal bed mobility: Modified Independent             General bed mobility comments: No assistance needed  Transfers Overall transfer level: Needs assistance Equipment used: None Transfers: Lateral/Scoot Transfers          Lateral/Scoot Transfers: Supervision General transfer comment: Supervision for scoot from bed <> WC with supervision and cues to check WC locks  Ambulation/Gait Ambulation/Gait assistance:  (pt completed WC mobility today)                Information systems manager  propulsion: Both upper extremities Wheelchair parts: Supervision/cueing Distance: 1000 ft (12 min) Wheelchair Assistance Details (indicate cue type and reason): cues for safety, locks. pt able to navigate well in hallway and avoid obstacles.  Modified Rankin (Stroke Patients Only)       Balance Overall balance assessment: Needs assistance Sitting-balance support: No upper extremity supported;Feet supported Sitting balance-Leahy Scale: Good Sitting balance - Comments: pt sitting EOB without UE support   Standing balance support: Bilateral upper extremity supported;During functional activity Standing balance-Leahy Scale: Poor Standing balance comment: reliant on BUE support                            Cognition Arousal/Alertness: Awake/alert Behavior During Therapy: WFL for tasks assessed/performed;Agitated Overall Cognitive Status: No family/caregiver present to determine baseline cognitive functioning Area of Impairment: Safety/judgement;Problem solving;Memory                   Current Attention Level: Selective Memory: Decreased short-term memory Following Commands: Follows one step commands consistently Safety/Judgement: Decreased awareness of safety;Decreased awareness of deficits Awareness: Emergent Problem Solving: Requires verbal cues;Requires tactile cues General Comments: easily agitated      Exercises      General Comments General comments (skin integrity, edema, etc.): No complaints of pain. pt in significantly better mood during therapy today, eager to participate with use of WC      Pertinent Vitals/Pain Pain Assessment: No/denies pain Pain Intervention(s): Monitored during session           PT Goals (current goals can now be found in the care  plan section) Acute Rehab PT Goals Patient Stated Goal: home PT Goal Formulation: With patient Time For Goal Achievement: 12/02/19 Potential to Achieve Goals: Fair Progress towards PT goals:  Progressing toward goals    Frequency    Min 3X/week      PT Plan Current plan remains appropriate       AM-PAC PT "6 Clicks" Mobility   Outcome Measure  Help needed turning from your back to your side while in a flat bed without using bedrails?: None Help needed moving from lying on your back to sitting on the side of a flat bed without using bedrails?: None Help needed moving to and from a bed to a chair (including a wheelchair)?: None Help needed standing up from a chair using your arms (e.g., wheelchair or bedside chair)?: A Little Help needed to walk in hospital room?: A Lot Help needed climbing 3-5 steps with a railing? : Total 6 Click Score: 18    End of Session Equipment Utilized During Treatment: Gait belt;Other (comment) (R limb protector) Activity Tolerance: Patient tolerated treatment well Patient left: with call bell/phone within reach;in bed (sitting EOB) Nurse Communication: Mobility status PT Visit Diagnosis: Other abnormalities of gait and mobility (R26.89)     Time: 9407-6808 PT Time Calculation (min) (ACUTE ONLY): 29 min  Charges:  $Therapeutic Activity: 8-22 mins $Wheel Chair Management: 8-22 mins                     Hardie Pulley, DPT   Acute Rehabilitation Department Pager #: [phone]   Otho Bellows 11/27/2019, 1:06 PM

## 2019-11-27 NOTE — Evaluation (Signed)
Occupational Therapy Evaluation Patient Details Name: Zachary Young MRN: [id number] DOB: [birth year] Today's Date: 11/27/2019    History of Present Illness Pt is a 59 y/o male s/p R transtibial amputation. PMH including but not limited to CHF, DM, HTN, L transmet amputation. Pt now s/p HD catheter placement with plan for starting HD while admitted.    Clinical Impression   Planned to guide pt in w/c mobility during ADLs, but unable to locate w/c (ortho gym under construction). Modified plan to assess ADLs at sink. Pt performed lateral scoot bed > recliner chair Supervision level and set up in front of sink. Pt declined to attempt grooming tasks in standing and required Min A for safety and thoroughness with shaving task, Setup for all other grooming tasks at sink. Aware that pt DC plan modified with pt planning to find an apartment. Continue to recommend SNF for short term rehab at this time, but will continue efforts in maximize ADL independence.     Follow Up Recommendations  SNF    Equipment Recommendations  3 in 1 bedside commode;Tub/shower bench;Wheelchair (measurements OT);Wheelchair cushion (measurements OT) (drop arm )    Recommendations for Other Services       Precautions / Restrictions Precautions Precautions: Fall Precaution Comments: R limb protector Required Braces or Orthoses: Other Brace Other Brace: R limb protector Restrictions Weight Bearing Restrictions: Yes RLE Weight Bearing: Non weight bearing      Mobility Bed Mobility Overal bed mobility: Modified Independent Bed Mobility: Supine to Sit     Supine to sit: Modified independent (Device/Increase time)     General bed mobility comments: No assistance needed  Transfers Overall transfer level: Needs assistance Equipment used: None Transfers: Lateral/Scoot Transfers          Lateral/Scoot Transfers: Supervision General transfer comment: Supervision for scoot from bed > recliner chair without  AD    Balance Overall balance assessment: Needs assistance Sitting-balance support: No upper extremity supported;Feet supported Sitting balance-Leahy Scale: Good Sitting balance - Comments: pt sitting EOB without UE support                                   ADL either performed or assessed with clinical judgement   ADL Overall ADL's : Needs assistance/impaired     Grooming: Minimal assistance;Sitting Grooming Details (indicate cue type and reason): Min A for shaving task at sink for safety and thoroughness. Setup for all other grooming tasks at sink                               General ADL Comments: Pt with decreased agitation today, calm during session. Continues to require cues for safety     Vision   Vision Assessment?: No apparent visual deficits     Perception     Praxis      Pertinent Vitals/Pain Pain Assessment: No/denies pain     Hand Dominance     Extremity/Trunk Assessment Upper Extremity Assessment Upper Extremity Assessment: Overall WFL for tasks assessed   Lower Extremity Assessment Lower Extremity Assessment: Defer to PT evaluation       Communication     Cognition Arousal/Alertness: Awake/alert Behavior During Therapy: WFL for tasks assessed/performed;Agitated Overall Cognitive Status: No family/caregiver present to determine baseline cognitive functioning Area of Impairment: Safety/judgement;Problem solving;Memory  Memory: Decreased short-term memory   Safety/Judgement: Decreased awareness of safety;Decreased awareness of deficits Awareness: Emergent Problem Solving: Requires verbal cues;Requires tactile cues General Comments: easily agitated   General Comments  No complaints of pain. Educated and collaborated with pt on plan to find apartment, recommendations for accessibility. Attempted to locate wc for session today, but ortho gym under construction and no access to equipment. Pt with  mild nick while shaving at sink, NT and RN notified    Exercises     Shoulder Instructions      Home Living                                          Prior Functioning/Environment                   OT Problem List:        OT Treatment/Interventions:      OT Goals(Current goals can be found in the care plan section) Acute Rehab OT Goals Patient Stated Goal: home OT Goal Formulation: With patient Time For Goal Achievement: 11/30/19 Potential to Achieve Goals: Fair ADL Goals Pt Will Perform Lower Body Dressing: with modified independence;with adaptive equipment;sitting/lateral leans Pt Will Transfer to Toilet: stand pivot transfer;with min assist Pt Will Perform Tub/Shower Transfer: Tub transfer;tub bench;with min assist  OT Frequency: Min 2X/week   Barriers to D/C:            Co-evaluation              AM-PAC OT "6 Clicks" Daily Activity     Outcome Measure Help from another person eating meals?: None Help from another person taking care of personal grooming?: A Little Help from another person toileting, which includes using toliet, bedpan, or urinal?: A Lot Help from another person bathing (including washing, rinsing, drying)?: A Little Help from another person to put on and taking off regular upper body clothing?: A Little Help from another person to put on and taking off regular lower body clothing?: A Lot 6 Click Score: 17   End of Session Nurse Communication: Mobility status;Other (comment) (shaving)  Activity Tolerance: Patient tolerated treatment well Patient left: in chair;with nursing/sitter in room;Other (comment) (NT present attending to completion of grooming task)  OT Visit Diagnosis: Other abnormalities of gait and mobility (R26.89);Unsteadiness on feet (R26.81);Muscle weakness (generalized) (M62.81);Other symptoms and signs involving cognitive function;Pain                Time: 7893-8101 OT Time Calculation (min): 43  min Charges:  OT General Charges $OT Visit: 1 Visit OT Treatments $Self Care/Home Management : 38-52 mins  Layla Maw, OTR/L  Layla Maw 11/27/2019, 10:44 AM
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
## 2019-11-28 DIAGNOSIS — L02611 Cutaneous abscess of right foot: Secondary | ICD-10-CM | POA: Diagnosis not present

## 2019-11-28 DIAGNOSIS — N186 End stage renal disease: Secondary | ICD-10-CM | POA: Diagnosis not present

## 2019-11-28 DIAGNOSIS — Z992 Dependence on renal dialysis: Secondary | ICD-10-CM | POA: Diagnosis not present

## 2019-11-28 LAB — RENAL FUNCTION PANEL
Albumin: 2.8 g/dL — ABNORMAL LOW (ref 3.5–5.0)
Anion gap: 13 (ref 5–15)
BUN: 29 mg/dL — ABNORMAL HIGH (ref 6–20)
CO2: 25 mmol/L (ref 22–32)
Calcium: 9 mg/dL (ref 8.9–10.3)
Chloride: 100 mmol/L (ref 98–111)
Creatinine, Ser: 4.69 mg/dL — ABNORMAL HIGH (ref 0.61–1.24)
GFR calc Af Amer: 15 mL/min — ABNORMAL LOW (ref >60)
GFR calc non Af Amer: 13 mL/min — ABNORMAL LOW (ref >60)
Glucose, Bld: 212 mg/dL — ABNORMAL HIGH (ref 70–99)
Phosphorus: 5.2 mg/dL — ABNORMAL HIGH (ref 2.5–4.6)
Potassium: 3.9 mmol/L (ref 3.5–5.1)
Sodium: 138 mmol/L (ref 135–145)

## 2019-11-28 LAB — CBC
HCT: 33.1 % — ABNORMAL LOW (ref 39.0–52.0)
Hemoglobin: 9.9 g/dL — ABNORMAL LOW (ref 13.0–17.0)
MCH: 26.6 pg (ref 26.0–34.0)
MCHC: 29.9 g/dL — ABNORMAL LOW (ref 30.0–36.0)
MCV: 89 fL (ref 80.0–100.0)
Platelets: 281 10*3/uL (ref 150–400)
RBC: 3.72 MIL/uL — ABNORMAL LOW (ref 4.22–5.81)
RDW: 18.7 % — ABNORMAL HIGH (ref 11.5–15.5)
WBC: 7.5 10*3/uL (ref 4.0–10.5)
nRBC: 0 % (ref 0.0–0.2)

## 2019-11-28 LAB — GLUCOSE, CAPILLARY
Glucose-Capillary: 199 mg/dL — ABNORMAL HIGH (ref 70–99)
Glucose-Capillary: 189 mg/dL — ABNORMAL HIGH (ref 70–99)
Glucose-Capillary: 244 mg/dL — ABNORMAL HIGH (ref 70–99)
Glucose-Capillary: 241 mg/dL — ABNORMAL HIGH (ref 70–99)

## 2019-11-28 MED ORDER — HEPARIN SODIUM (PORCINE) 1000 UNIT/ML IJ SOLN
INTRAMUSCULAR | Status: AC
  Administered 2019-11-28: 1000 [IU]
  Filled 2019-11-28: qty 4

## 2019-11-28 MED ORDER — OXYCODONE-ACETAMINOPHEN 5-325 MG PO TABS
ORAL_TABLET | ORAL | Status: AC
  Filled 2019-11-28: qty 1
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
## 2019-11-28 NOTE — Progress Notes (Signed)
[facility] Progress Note   59 y.o.yo malewho was admitted on 7/3/2021with new start to dialysis due to ESRD  Assessment/ Plan:   1. New ESRD- Has started HD on 7/23. Has TDC and AVF placed 7/30. Currently on TTS schedule, will be due next on Tuesday. CLIP in process but disposition is unknown at this time- many social issues "unable to procure appropriate housing- registered sex offender - family members who refuse to take him back"  Seen on  HD Sat  We were able to get on  1st shift as pt requested but we will not always be able to do so 142/82 2.5L net UF goal 3K RIJ TC  No changes; next HD Tues   2. HTN/volume- BP good/even low no BP meds - Has IDH- try some midodrine if needed but ok for today so far 3. Anemia- climbing slowly - darbe 150 and iron  4. Secondary hyperparathyroidism- calcium/phos OK- no meds- PTH 22- have loosened diet a little in an attempt to improve his mood. Starting renvela 1 TIDM on 8/12. Will recheck early  Next week.  Subjective:   Denies f/c/n/v/sob Very good appetite. No events overnight   Objective:   BP (!) 175/75 (BP Location: Right Arm)   Pulse 69   Temp 98.6 F (37 C) (Oral)   Resp 17   Ht 6' 3"  (1.905 m)   Wt 127 kg   SpO2 97%   BMI 35.00 kg/m   Intake/Output Summary (Last 24 hours) at 11/28/2019 0754 Last data filed at 11/27/2019 1700 Gross per 24 hour  Intake 720 ml  Output --  Net 720 ml   Weight change:   Physical Exam: General:on HD- supine Heart: RRR Lungs: mostly clear Abdomen: soft, non tender Extremities: min edema, left TMA, rt BKA Dialysis Access: TDC plus AVF on left- Good thrill(BBT not transposed)  Imaging: No results found.  Labs: BMET Recent Labs  Lab 11/21/19 0809 11/24/19 0743 11/26/19 1437  NA 134* 139 137  K 4.3 4.3 4.0  CL 99 102 101  CO2 25 22 26   GLUCOSE 231* 202* 185*  BUN 35* 40* 35*  CREATININE 4.35* 4.75* 4.96*  CALCIUM 8.4* 7.8* 9.4  PHOS 3.7  5.3* 5.8*   CBC Recent Labs  Lab 11/21/19 0809 11/24/19 0743 11/26/19 1437  WBC 8.7 10.8* 8.6  HGB 8.9* 9.0* 9.2*  HCT 29.9* 30.3* 30.7*  MCV 87.7 88.6 88.2  PLT 248 255 263    Medications:    . aspirin EC  81 mg Oral Daily  . Chlorhexidine Gluconate Cloth  6 each Topical Q0600  . collagenase   Topical Daily  . darbepoetin (ARANESP) injection - DIALYSIS  150 mcg Intravenous Q Tue-HD  . heparin injection (subcutaneous)  5,000 Units Subcutaneous Q8H  . hydrocerin   Topical BID  . insulin aspart  0-9 Units Subcutaneous TID WC  . insulin aspart  4 Units Subcutaneous TID WC  . insulin glargine  15 Units Subcutaneous QHS  . lidocaine  1 patch Transdermal Q24H  . midodrine  10 mg Oral TID WC  . multivitamin with minerals  1 tablet Oral Daily  . polyethylene glycol  17 g Oral QHS  . pregabalin  50 mg Oral BID  . prochlorperazine  10 mg Intravenous QHS  . Ensure Max Protein  11 oz Oral BID  . sevelamer carbonate  800 mg Oral TID WC  . sodium chloride flush  3 mL Intravenous Q12H  [name], MD 11/28/2019, 7:54 AM

## 2019-11-29 DIAGNOSIS — L02611 Cutaneous abscess of right foot: Secondary | ICD-10-CM | POA: Diagnosis not present

## 2019-11-29 DIAGNOSIS — N186 End stage renal disease: Secondary | ICD-10-CM | POA: Diagnosis not present

## 2019-11-29 DIAGNOSIS — Z992 Dependence on renal dialysis: Secondary | ICD-10-CM | POA: Diagnosis not present

## 2019-11-29 LAB — GLUCOSE, CAPILLARY
Glucose-Capillary: 149 mg/dL — ABNORMAL HIGH (ref 70–99)
Glucose-Capillary: 190 mg/dL — ABNORMAL HIGH (ref 70–99)
Glucose-Capillary: 249 mg/dL — ABNORMAL HIGH (ref 70–99)
Glucose-Capillary: 249 mg/dL — ABNORMAL HIGH (ref 70–99)

## 2019-11-29 NOTE — Progress Notes (Signed)
Pt had episode of emesis, thin clear and yellow. Approximently 572ml. Pt has no IV acess at this time and denies IV placement. Provided damp wash cloths for comfort. MD aware and will be at bedside shortly. Pt denies additional requests at this time, states he wants RN to leave and close door. Will continue to monitor
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
## 2019-11-29 NOTE — Progress Notes (Signed)
NO IV present upon assessment, pt declined new PIV. Will notify MD of pt requesting oral antienemic.

## 2019-11-29 NOTE — Progress Notes (Signed)
  [facility] Progress Note   59 y.o.yo malewho was admitted on 7/3/2021with new start to dialysis due to ESRD  Assessment/ Plan:   1. New ESRD- Has started HD on 7/23. Has TDC and AVF placed 7/30. Currently on TTS schedule, will be due next on Tuesday. CLIP in process but disposition is unknown at this time- many social issues "unable to procure appropriate housing- registered sex offender - family members who refuse to take him back"  Last HD Sat and able to net UF 2342 ml  Next  HD Tues; orders written   2. HTN/volume- BP good/even low no BP meds - Has IDH- try some midodrine if needed but ok for today so far 3. Anemia- climbing slowly - darbe 150 and iron  4. Secondary hyperparathyroidism- calcium/phos OK- no meds- PTH 22- have loosened diet a little in an attempt to improve his mood. Starting renvela 1 TIDM on 8/12.Will recheck early Next week.  Subjective:   Denies f/c/n/v/sob Very good appetite. No events overnight; tolerated HD yesterday   Objective:   BP (!) 150/79 (BP Location: Right Arm)   Pulse 75   Temp 98 F (36.7 C) (Oral)   Resp 18   Ht _0  (1.905 m)   Wt 124.4 kg   SpO2 99%   BMI 34.28 kg/m   Intake/Output Summary (Last 24 hours) at 11/29/2019 0902 Last data filed at 11/29/2019 0732 Gross per 24 hour  Intake 960 ml  Output 2792 ml  Net -1832 ml   Weight change:   Physical Exam: General:on HD- supine Heart: RRR Lungs: mostly clear Abdomen: soft, non tender Extremities: min edema, left TMA, rt BKA Dialysis Access: TDC plus AVF on left- Good thrill(BBT not transposed)  Imaging: No results found.  Labs: BMET Recent Labs  Lab 11/24/19 0743 11/26/19 1437 11/28/19 0732  NA 139 137 138  K 4.3 4.0 3.9  CL 102 101 100  CO2 _1 GLUCOSE 202* 185* 212*  BUN 40* 35* 29*  CREATININE 4.75* 4.96* 4.69*  CALCIUM 7.8* 9.4 9.0  PHOS 5.3* 5.8* 5.2*   CBC Recent Labs  Lab 11/24/19 0743 11/26/19 1437  11/28/19 0732  WBC 10.8* 8.6 7.5  HGB 9.0* 9.2* 9.9*  HCT 30.3* 30.7* 33.1*  MCV 88.6 88.2 89.0  PLT 255 263 281    Medications:    . aspirin EC  81 mg Oral Daily  . Chlorhexidine Gluconate Cloth  6 each Topical Q0600  . collagenase   Topical Daily  . darbepoetin (ARANESP) injection - DIALYSIS  150 mcg Intravenous Q Tue-HD  . heparin injection (subcutaneous)  5,000 Units Subcutaneous Q8H  . hydrocerin   Topical BID  . insulin aspart  0-9 Units Subcutaneous TID WC  . insulin aspart  4 Units Subcutaneous TID WC  . insulin glargine  15 Units Subcutaneous QHS  . lidocaine  1 patch Transdermal Q24H  . midodrine  10 mg Oral TID WC  . multivitamin with minerals  1 tablet Oral Daily  . polyethylene glycol  17 g Oral QHS  . pregabalin  50 mg Oral BID  . prochlorperazine  10 mg Intravenous QHS  . Ensure Max Protein  11 oz Oral BID  . sevelamer carbonate  800 mg Oral TID WC  . sodium chloride flush  3 mL Intravenous Q12H      [name], MD 11/29/2019, 9:02 AM

## 2019-11-30 DIAGNOSIS — Z89511 Acquired absence of right leg below knee: Secondary | ICD-10-CM | POA: Diagnosis not present

## 2019-11-30 DIAGNOSIS — N186 End stage renal disease: Secondary | ICD-10-CM | POA: Diagnosis not present

## 2019-11-30 DIAGNOSIS — Z87898 Personal history of other specified conditions: Secondary | ICD-10-CM | POA: Diagnosis not present

## 2019-11-30 DIAGNOSIS — Z992 Dependence on renal dialysis: Secondary | ICD-10-CM | POA: Diagnosis not present

## 2019-11-30 LAB — GLUCOSE, CAPILLARY
Glucose-Capillary: 172 mg/dL — ABNORMAL HIGH (ref 70–99)
Glucose-Capillary: 191 mg/dL — ABNORMAL HIGH (ref 70–99)
Glucose-Capillary: 164 mg/dL — ABNORMAL HIGH (ref 70–99)
Glucose-Capillary: 154 mg/dL — ABNORMAL HIGH (ref 70–99)

## 2019-11-30 NOTE — Progress Notes (Signed)
Physical Therapy Treatment Patient Details Name: Zachary Young MRN: [id number] DOB: [birth year] Today's Date: 11/30/2019    History of Present Illness Pt is a 59 y/o male s/p R transtibial amputation. PMH including but not limited to CHF, DM, HTN, L transmet amputation. Pt now s/p HD catheter placement with plan for starting HD while admitted.     PT Comments    Continuing work on functional mobility and activity tolerance;  Session focused on therapeutic exercises aimed at stretching hip and knee flexors, and strengthening gluteals in prep for prosthesis; Will plan to bring a printed version of these exercises for pt to do independently  I agree that a custom shoe for L foot will help with Mr. Dibartolo's stability in L stance; REcommend getting an orthotist consult for L shoe   Follow Up Recommendations        Equipment Recommendations  Wheelchair (measurements PT);Wheelchair cushion (measurements PT);Other (comment) (amputee attachment)    Recommendations for Other Services       Precautions / Restrictions Precautions Precautions: Fall Precaution Comments: R limb protector Required Braces or Orthoses: Other Brace Other Brace: R limb protector Restrictions RLE Weight Bearing: Non weight bearing Other Position/Activity Restrictions: NWB R LE    Mobility  Bed Mobility   Bed Mobility: Rolling;Supine to Sit;Sit to Supine Rolling: Supervision   Supine to sit: Modified independent (Device/Increase time) Sit to supine: Modified independent (Device/Increase time)   General bed mobility comments: Rolled into prone in addition to getting to sitting and supine during therex  Transfers Overall transfer level: Needs assistance Equipment used: Rolling walker (2 wheeled) Transfers: Sit to/from Stand Sit to Stand: Min guard         General transfer comment: Stood from elevated bed; Cues for hand placement, but still pulled up on RW  Ambulation/Gait                  Stairs             Wheelchair Mobility    Modified Rankin (Stroke Patients Only)       Balance             Standing balance-Leahy Scale: Poor Standing balance comment: reliant on BUE support; Notably fatigued with arms shaking after 10 R hip extension reps                            Cognition Arousal/Alertness: Awake/alert Behavior During Therapy: WFL for tasks assessed/performed;Agitated Overall Cognitive Status: No family/caregiver present to determine baseline cognitive functioning                             Awareness: Emergent Problem Solving: Requires verbal cues;Requires tactile cues        Exercises Total Joint Exercises Bridges: AROM;Both;10 reps (with bolster under R thigh) Amputee Exercises Hip Extension: AROM;Right;10 reps;Standing Other Exercises Other Exercises: sidelying hip abduction x10 R and L Other Exercises: sidelying hip extension x10 reps R and L Other Exercises: Alternating prone hip extension x10 Other Exercises: Isometric hip abduction with belt seated x10 Other Exercises: bil hamstring stretch seated with 20 sec hold    General Comments        Pertinent Vitals/Pain Pain Assessment: No/denies pain Pain Intervention(s): Monitored during session    Home Living                      Prior Function  PT Goals (current goals can now be found in the care plan section) Acute Rehab PT Goals Patient Stated Goal: home; tells me he is interested in getting a prosthesis PT Goal Formulation: With patient Time For Goal Achievement: 12/02/19 Potential to Achieve Goals: Fair Progress towards PT goals: Progressing toward goals    Frequency    Min 3X/week      PT Plan Current plan remains appropriate    Co-evaluation              AM-PAC PT "6 Clicks" Mobility   Outcome Measure  Help needed turning from your back to your side while in a flat bed without using bedrails?:  None Help needed moving from lying on your back to sitting on the side of a flat bed without using bedrails?: None Help needed moving to and from a bed to a chair (including a wheelchair)?: None Help needed standing up from a chair using your arms (e.g., wheelchair or bedside chair)?: A Little Help needed to walk in hospital room?: A Lot Help needed climbing 3-5 steps with a railing? : Total 6 Click Score: 18    End of Session Equipment Utilized During Treatment: Gait belt Activity Tolerance: Patient tolerated treatment well Patient left: in bed;with call bell/phone within reach Nurse Communication: Mobility status PT Visit Diagnosis: Other abnormalities of gait and mobility (R26.89) Pain - Right/Left: Right Pain - part of body: Leg     Time: 9935-7017 PT Time Calculation (min) (ACUTE ONLY): 30 min  Charges:  $Therapeutic Exercise: 23-37 mins                     Roney Marion, PT  Acute Rehabilitation Services Pager [phone] Office [place] 11/30/2019, 7:33 PM

## 2019-11-30 NOTE — Progress Notes (Signed)
  [facility] Progress Note   59 y.o.yo malewho was admitted on 7/3/2021with new start to dialysis due to ESRD  Assessment/ Plan:   1. New ESRD- Has started HD on 7/23. Has TDC and AVF placed 7/30. Currently on TTS schedule, will be due tomorrow. CLIP in process but disposition is unknown at this time- many social issues "unable to procure appropriate housing- registered sex offender - family members who refuse to take him back".  We will continue to follow this issue 2. HTN/volume-blood pressure acceptable today.  Midodrine as needed and dialysis 3. Anemia-CBC tomorrow- darbe 150 and iron  4. Secondary hyperparathyroidism- calcium/phos OK- no meds- PTH 22-on Renvela.  Follow-up phosphorus tomorrow.  Subjective:   Patient states he is feeling well without complaints.  Is concerned that he is not urinating and we discussed why that is occurring.   Objective:   BP (!) 149/68 (BP Location: Right Arm)   Pulse 73   Temp 97.9 F (36.6 C) (Oral)   Resp 16   Ht 6\' 3"  (1.905 m)   Wt 124.9 kg   SpO2 96%   BMI 34.42 kg/m   Intake/Output Summary (Last 24 hours) at 11/30/2019 1349 Last data filed at 11/30/2019 0646 Gross per 24 hour  Intake 960 ml  Output 500 ml  Net 460 ml   Weight change: -2.8 kg  Physical Exam: General:Sitting in bed, no distress Heart: Normal rate Lungs: Bilateral chest rise with no increased work of breathing Abdomen: soft, non tender, mild distention Extremities: min edema, left TMA, rt BKA Dialysis Access: TDC plus AVF on left- Good thrill(BBT not transposed)  Imaging: No results found.  Labs: BMET Recent Labs  Lab 11/24/19 0743 11/26/19 1437 11/28/19 0732  NA 139 137 138  K 4.3 4.0 3.9  CL 102 101 100  CO2 22 26 25   GLUCOSE 202* 185* 212*  BUN 40* 35* 29*  CREATININE 4.75* 4.96* 4.69*  CALCIUM 7.8* 9.4 9.0  PHOS 5.3* 5.8* 5.2*   CBC Recent Labs  Lab 11/24/19 0743 11/26/19 1437 11/28/19 0732  WBC 10.8* 8.6 7.5   HGB 9.0* 9.2* 9.9*  HCT 30.3* 30.7* 33.1*  MCV 88.6 88.2 89.0  PLT 255 263 281    Medications:    . aspirin EC  81 mg Oral Daily  . Chlorhexidine Gluconate Cloth  6 each Topical Q0600  . collagenase   Topical Daily  . darbepoetin (ARANESP) injection - DIALYSIS  150 mcg Intravenous Q Tue-HD  . heparin injection (subcutaneous)  5,000 Units Subcutaneous Q8H  . hydrocerin   Topical BID  . insulin aspart  0-9 Units Subcutaneous TID WC  . insulin aspart  4 Units Subcutaneous TID WC  . insulin glargine  15 Units Subcutaneous QHS  . lidocaine  1 patch Transdermal Q24H  . midodrine  10 mg Oral TID WC  . multivitamin with minerals  1 tablet Oral Daily  . polyethylene glycol  17 g Oral QHS  . pregabalin  50 mg Oral BID  . prochlorperazine  10 mg Intravenous QHS  . Ensure Max Protein  11 oz Oral BID  . sevelamer carbonate  800 mg Oral TID WC  . sodium chloride flush  3 mL Intravenous Q12H     Reesa Chew  11/30/2019, 1:49 PM
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
## 2019-12-01 DIAGNOSIS — Z87898 Personal history of other specified conditions: Secondary | ICD-10-CM | POA: Diagnosis not present

## 2019-12-01 DIAGNOSIS — Z89511 Acquired absence of right leg below knee: Secondary | ICD-10-CM | POA: Diagnosis not present

## 2019-12-01 DIAGNOSIS — G546 Phantom limb syndrome with pain: Secondary | ICD-10-CM | POA: Diagnosis not present

## 2019-12-01 DIAGNOSIS — N186 End stage renal disease: Secondary | ICD-10-CM | POA: Diagnosis not present

## 2019-12-01 LAB — RENAL FUNCTION PANEL
Albumin: 2.8 g/dL — ABNORMAL LOW (ref 3.5–5.0)
Anion gap: 11 (ref 5–15)
BUN: 46 mg/dL — ABNORMAL HIGH (ref 6–20)
CO2: 25 mmol/L (ref 22–32)
Calcium: 9.1 mg/dL (ref 8.9–10.3)
Chloride: 101 mmol/L (ref 98–111)
Creatinine, Ser: 5.39 mg/dL — ABNORMAL HIGH (ref 0.61–1.24)
GFR calc Af Amer: 13 mL/min — ABNORMAL LOW (ref >60)
GFR calc non Af Amer: 11 mL/min — ABNORMAL LOW (ref >60)
Glucose, Bld: 166 mg/dL — ABNORMAL HIGH (ref 70–99)
Phosphorus: 6.1 mg/dL — ABNORMAL HIGH (ref 2.5–4.6)
Potassium: 4.2 mmol/L (ref 3.5–5.1)
Sodium: 137 mmol/L (ref 135–145)

## 2019-12-01 LAB — CBC
HCT: 33.9 % — ABNORMAL LOW (ref 39.0–52.0)
Hemoglobin: 10 g/dL — ABNORMAL LOW (ref 13.0–17.0)
MCH: 26.4 pg (ref 26.0–34.0)
MCHC: 29.5 g/dL — ABNORMAL LOW (ref 30.0–36.0)
MCV: 89.4 fL (ref 80.0–100.0)
Platelets: 299 10*3/uL (ref 150–400)
RBC: 3.79 MIL/uL — ABNORMAL LOW (ref 4.22–5.81)
RDW: 19 % — ABNORMAL HIGH (ref 11.5–15.5)
WBC: 9.6 10*3/uL (ref 4.0–10.5)
nRBC: 0 % (ref 0.0–0.2)

## 2019-12-01 LAB — GLUCOSE, CAPILLARY
Glucose-Capillary: 157 mg/dL — ABNORMAL HIGH (ref 70–99)
Glucose-Capillary: 169 mg/dL — ABNORMAL HIGH (ref 70–99)
Glucose-Capillary: 198 mg/dL — ABNORMAL HIGH (ref 70–99)
Glucose-Capillary: 189 mg/dL — ABNORMAL HIGH (ref 70–99)
Glucose-Capillary: 202 mg/dL — ABNORMAL HIGH (ref 70–99)

## 2019-12-01 MED ORDER — HEPARIN SODIUM (PORCINE) 1000 UNIT/ML IJ SOLN
INTRAMUSCULAR | Status: AC
  Filled 2019-12-01: qty 1

## 2019-12-01 MED ORDER — DARBEPOETIN ALFA 150 MCG/0.3ML IJ SOSY
PREFILLED_SYRINGE | INTRAMUSCULAR | Status: AC
  Administered 2019-12-01: 150 ug via INTRAVENOUS
  Filled 2019-12-01: qty 0.3

## 2019-12-01 MED ORDER — HEPARIN SODIUM (PORCINE) 1000 UNIT/ML IJ SOLN
INTRAMUSCULAR | Status: AC
  Administered 2019-12-01: 1000 [IU]
  Filled 2019-12-01: qty 4

## 2019-12-01 NOTE — Procedures (Signed)
I was present at this dialysis session. I have reviewed the session itself and made appropriate changes.   Filed Weights   11/30/19 0623 12/01/19 0620 12/01/19 0700  Weight: 124.9 kg 126 kg 126 kg    Recent Labs  Lab 12/01/19 0732  NA 137  K 4.2  CL 101  CO2 25  GLUCOSE 166*  BUN 46*  CREATININE 5.39*  CALCIUM 9.1  PHOS 6.1*    Recent Labs  Lab 11/26/19 1437 11/28/19 0732 12/01/19 0722  WBC 8.6 7.5 9.6  HGB 9.2* 9.9* 10.0*  HCT 30.7* 33.1* 33.9*  MCV 88.2 89.0 89.4  PLT 263 281 299    Scheduled Meds: . aspirin EC  81 mg Oral Daily  . Chlorhexidine Gluconate Cloth  6 each Topical Q0600  . collagenase   Topical Daily  . darbepoetin (ARANESP) injection - DIALYSIS  150 mcg Intravenous Q Tue-HD  . heparin injection (subcutaneous)  5,000 Units Subcutaneous Q8H  . hydrocerin   Topical BID  . insulin aspart  0-9 Units Subcutaneous TID WC  . insulin aspart  4 Units Subcutaneous TID WC  . insulin glargine  15 Units Subcutaneous QHS  . lidocaine  1 patch Transdermal Q24H  . midodrine  10 mg Oral TID WC  . multivitamin with minerals  1 tablet Oral Daily  . polyethylene glycol  17 g Oral QHS  . pregabalin  50 mg Oral BID  . prochlorperazine  10 mg Intravenous QHS  . Ensure Max Protein  11 oz Oral BID  . sevelamer carbonate  800 mg Oral TID WC  . sodium chloride flush  3 mL Intravenous Q12H   Continuous Infusions: . sodium chloride 10 mL/hr at 11/13/19 0949   PRN Meds:.albuterol, docusate sodium, hydrOXYzine, lactulose, ondansetron **OR** ondansetron (ZOFRAN) IV, oxyCODONE-acetaminophen, prochlorperazine, simethicone, white petrolatum   Zachary Quint, MD [facility] 12/01/2019, 11:38 AM

## 2019-12-01 NOTE — TOC Progression Note (Signed)
Transition of Care [facility]) - Progression Note    Patient Details  Name: Naftoli Penny MRN: [id number] Date of Birth: [birth year]  Transition of Care [facility]) CM/SW [place], [place] Phone Number: 12/01/2019, 4:05 PM  Clinical Narrative:    Spoke with [name], [facility] supervisor. We discussed case. There continues to be multiple barriers to placement. Confirmed that our department cannot arrange private apartment housing, Section 8 housing etc for pt at this time. Leadership aware and actively supporting TOC team to continue w/ placement. Family aware of multiple barriers also.   Expected Discharge Plan: Skilled Nursing Facility Barriers to Discharge: Continued Medical Work up, No SNF bed, Homeless with medical needs, Waiting for outpatient dialysis  Expected Discharge Plan and Services Expected Discharge Plan: [place] In-house Referral: Clinical Social Work Discharge Planning Services: CM Consult Post Acute Care Choice: [place], Dialysis Living arrangements for the past 2 months: No permanent address  Readmission Risk Interventions Readmission Risk Prevention Plan 11/12/2019  Transportation Screening Complete  Medication Review [employer]) Referral to Pharmacy  PCP or Specialist appointment within 3-5 days of discharge Not Complete  PCP/Specialist Appt Not Complete comments plan for SNF; no PCP  HRI or Home Care Consult Complete  SW Recovery Care/Counseling Consult Complete  Palliative Care Screening Complete  Skilled Nursing Facility Complete  Some recent data might be hidden

## 2019-12-01 NOTE — Progress Notes (Signed)
Patient off floor to dialysis

## 2019-12-01 NOTE — Progress Notes (Signed)
Nutrition Brief Note  59 year old male presents with wound of R foot and hyperglycemia. PMH significant for kidney disease, T2DM, CHF, HTN, diabetic neuropathy.  7/7 - s/pRight BKA. 7/22 - Right IJ approachtunneled HD catheterplaced 7/23 - HD initiated 7/30 -Left brachiobasilic AV fistula creation   Current diet order is 2 gram sodium with 1500 ml fluids, patient is consuming approximately 100% of meals at this time. Labs and medications reviewed. Pt currently has Ensure Max ordered and has been consuming them. No further nutrition interventions warranted at this time. If nutrition issues arise, please consult RD. Discharge planning ongoing.   Corrin Parker, MS, RD, LDN RD pager number/after hours weekend pager number on Amion.
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
## 2019-12-01 NOTE — Progress Notes (Signed)
Orthopedic Tech Progress Note Patient Details:  Zachary Young [birth year] [id number]   CALLED IN ORDER  to [employer] for a off loading darco shoe Patient ID: Zachary Young, male   DOB: 12-27-1960, 59 y.o.   MRN: [id number]   Janit Pagan 12/01/2019, 1:21 PM

## 2019-12-01 NOTE — Progress Notes (Signed)
  [facility] Progress Note   59 y.o.yo malewho was admitted on 7/3/2021with new start to dialysis due to ESRD  Assessment/ Plan:   1. New ESRD- Has started HD on 7/23. Has TDC and AVF placed 7/30. Currently on TTS schedule, will be due tomorrow. CLIP in process but disposition is unknown at this time- many social issues "unable to procure appropriate housing- registered sex offender - family members who refuse to take him back".  We will continue to follow this issue -hd today, next hd 8/19 2. HTN/volume-blood pressure acceptable today.  Midodrine as needed and dialysis 3. Anemia-hgb 10 today. darbe 150 and iron  4. Secondary hyperparathyroidism- calcium/phos OK- no meds- PTH 22-on Renvela.  Restrict phos in diet  Subjective:   Seen on hd, no complaints. uf goal 2L   Objective:   BP 111/65 (BP Location: Right Arm)   Pulse 85   Temp 98.3 F (36.8 C) (Oral)   Resp 18   Ht 6\' 3"  (1.905 m)   Wt 126 kg   SpO2 93%   BMI 34.72 kg/m   Intake/Output Summary (Last 24 hours) at 12/01/2019 1138 Last data filed at 12/01/2019 6734 Gross per 24 hour  Intake 120 ml  Output 400 ml  Net -280 ml   Weight change: 1.1 kg  Physical Exam: General:Sitting in bed, no distress Heart: Normal rate Lungs: Bilateral chest rise with no increased work of breathing Abdomen: soft, non tender, mild distention Extremities: min edema, left TMA, rt BKA Dialysis Access: TDC plus AVF on left- Good thrill(BBT not transposed)  Imaging: No results found.  Labs: BMET Recent Labs  Lab 11/26/19 1437 11/28/19 0732 12/01/19 0732  NA 137 138 137  K 4.0 3.9 4.2  CL 101 100 101  CO2 26 25 25   GLUCOSE 185* 212* 166*  BUN 35* 29* 46*  CREATININE 4.96* 4.69* 5.39*  CALCIUM 9.4 9.0 9.1  PHOS 5.8* 5.2* 6.1*   CBC Recent Labs  Lab 11/26/19 1437 11/28/19 0732 12/01/19 0722  WBC 8.6 7.5 9.6  HGB 9.2* 9.9* 10.0*  HCT 30.7* 33.1* 33.9*  MCV 88.2 89.0 89.4  PLT 263 281 299     Medications:    . aspirin EC  81 mg Oral Daily  . Chlorhexidine Gluconate Cloth  6 each Topical Q0600  . collagenase   Topical Daily  . darbepoetin (ARANESP) injection - DIALYSIS  150 mcg Intravenous Q Tue-HD  . heparin injection (subcutaneous)  5,000 Units Subcutaneous Q8H  . hydrocerin   Topical BID  . insulin aspart  0-9 Units Subcutaneous TID WC  . insulin aspart  4 Units Subcutaneous TID WC  . insulin glargine  15 Units Subcutaneous QHS  . lidocaine  1 patch Transdermal Q24H  . midodrine  10 mg Oral TID WC  . multivitamin with minerals  1 tablet Oral Daily  . polyethylene glycol  17 g Oral QHS  . pregabalin  50 mg Oral BID  . prochlorperazine  10 mg Intravenous QHS  . Ensure Max Protein  11 oz Oral BID  . sevelamer carbonate  800 mg Oral TID WC  . sodium chloride flush  3 mL Intravenous Q12H     Zachary Young  12/01/2019, 11:38 AM

## 2019-12-02 LAB — GLUCOSE, CAPILLARY
Glucose-Capillary: 151 mg/dL — ABNORMAL HIGH (ref 70–99)
Glucose-Capillary: 178 mg/dL — ABNORMAL HIGH (ref 70–99)
Glucose-Capillary: 250 mg/dL — ABNORMAL HIGH (ref 70–99)
Glucose-Capillary: 234 mg/dL — ABNORMAL HIGH (ref 70–99)

## 2019-12-02 NOTE — Progress Notes (Signed)
Physical Therapy Treatment Patient Details Name: Zachary Young MRN: [id number] DOB: March 15, 1961 Today's Date: 12/02/2019    History of Present Illness Pt is a 59 y/o male s/p R transtibial amputation. PMH including but not limited to CHF, DM, HTN, L transmet amputation. Pt now s/p HD catheter placement and has started HD.     PT Comments    Pt is progressing towards goals. Pt was modified independent with bed mobility but required min assist with RW for transfers. Pt was educated on HEP. Would continue to benefit from aggressive hip strengthening and  stretching exercises, as well as wc training and mobility. Will continue to follow acutely.    Follow Up Recommendations  SNF     Equipment Recommendations  Wheelchair (measurements PT);Wheelchair cushion (measurements PT);Other (comment)    Recommendations for Other Services       Precautions / Restrictions Precautions Precautions: Fall Precaution Comments: R limb protector Restrictions Weight Bearing Restrictions: No RLE Weight Bearing: Non weight bearing Other Position/Activity Restrictions: NWB R LE    Mobility  Bed Mobility Overal bed mobility: Modified Independent                Transfers Overall transfer level: Needs assistance Equipment used: Rolling walker (2 wheeled) Transfers: Sit to/from Stand Sit to Stand: Min assist         General transfer comment: pt required min assist with RW for lift off with sit<>stand. Pt required increased time with power up to standing. pt required verbal cues for hand placement with transfer  Ambulation/Gait                 Stairs             Wheelchair Mobility    Modified Rankin (Stroke Patients Only)       Balance Overall balance assessment: Needs assistance Sitting-balance support: No upper extremity supported;Feet supported Sitting balance-Leahy Scale: Fair Sitting balance - Comments: pt able to sitt EOB without UE support   Standing  balance support: Bilateral upper extremity supported;During functional activity Standing balance-Leahy Scale: Poor Standing balance comment: pt was reliant on BUE support on RW                            Cognition Arousal/Alertness: Awake/alert Behavior During Therapy: Agitated Overall Cognitive Status: No family/caregiver present to determine baseline cognitive functioning Area of Impairment: Safety/judgement;Problem solving                         Safety/Judgement: Decreased awareness of safety;Decreased awareness of deficits   Problem Solving: Requires verbal cues;Requires tactile cues General Comments: pt was agitated during session and didn't want to do HEP initially, however, was agreeable as session progressed.       Exercises Total Joint Exercises Bridges: AROM;Both;20 reps;Supine (2x10 with bolster under distal tibia) Amputee Exercises Gluteal Sets: AROM;Both;20 reps;Supine Hip Extension: AROM;Right;20 reps;Prone Hip ABduction/ADduction: AROM;Right;20 reps;Sidelying (manual resistance applied at lateral thigh) Other Exercises Other Exercises: Prone hip flexor stretch 2 x 30 seconds holds Other Exercises: standing hip abduction x 10 RLE Other Exercises: standing hip extension x 10 RLE Other Exercises: bilateral hamstring stretch 2 x30 seconds with bolster under distal tibia in long sitting    General Comments        Pertinent Vitals/Pain Pain Assessment: No/denies pain    Home Living  Prior Function            PT Goals (current goals can now be found in the care plan section) Acute Rehab PT Goals Patient Stated Goal: getting home PT Goal Formulation: With patient Time For Goal Achievement: 12/16/19 Potential to Achieve Goals: Fair Progress towards PT goals: Progressing toward goals    Frequency    Min 3X/week      PT Plan Current plan remains appropriate    Co-evaluation               AM-PAC PT "6 Clicks" Mobility   Outcome Measure  Help needed turning from your back to your side while in a flat bed without using bedrails?: None Help needed moving from lying on your back to sitting on the side of a flat bed without using bedrails?: None Help needed moving to and from a bed to a chair (including a wheelchair)?: A Little Help needed standing up from a chair using your arms (e.g., wheelchair or bedside chair)?: A Little Help needed to walk in hospital room?: A Lot Help needed climbing 3-5 steps with a railing? : Total 6 Click Score: 17    End of Session Equipment Utilized During Treatment: Gait belt Activity Tolerance: Patient tolerated treatment well Patient left: in bed;with call Shruthi Northrup/phone within reach;with bed alarm set Nurse Communication: Mobility status PT Visit Diagnosis: Other abnormalities of gait and mobility (R26.89)     Time: 4627-0350 PT Time Calculation (min) (ACUTE ONLY): 28 min  Charges:  $Therapeutic Exercise: 23-37 mins                    Gloriann Loan, SPT  [place]  Office: [phone]  12/02/2019, 4:18 PM
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
## 2019-12-02 NOTE — Progress Notes (Signed)
  [facility] Progress Note   59 y.o.yo malewho was admitted on 7/3/2021with new start to dialysis due to ESRD  Assessment/ Plan:   1. New ESRD- Has started HD on 7/23. Has TDC and AVF placed 7/30. Currently on TTS schedule, will be due tomorrow. CLIP in process but disposition is unknown at this time- many social issues "unable to procure appropriate housing- registered sex offender - family members who refuse to take him back".  We will continue to follow this issue -next hd 8/19 2. HTN/volume-blood pressure acceptable today.  Midodrine as needed and dialysis 3. Anemia-hgb 10 today. darbe 150 and iron  4. Secondary hyperparathyroidism- calcium/phos OK- no meds- PTH 22-on Renvela.  Restrict phos in diet  Subjective:   Tolerated HD yesterday, no complaints. No acute events. UF'd 1661cc   Objective:   BP 138/69 (BP Location: Right Wrist)   Pulse 67   Temp 97.6 F (36.4 C) (Oral)   Resp 18   Ht 6\' 3"  (1.905 m)   Wt 124.4 kg   SpO2 96%   BMI 34.28 kg/m   Intake/Output Summary (Last 24 hours) at 12/02/2019 1044 Last data filed at 12/02/2019 0720 Gross per 24 hour  Intake 960 ml  Output 1861 ml  Net -901 ml   Weight change: -1.6 kg  Physical Exam: General:Sitting up in bed, no distress Heart: Normal rate Lungs: Bilateral chest rise with no increased work of breathing Abdomen: nondistended Extremities: left TMA, rt BKA Dialysis Access: TDC plus AVF on left- Good thrill(BBT not transposed)  Imaging: No results found.  Labs: BMET Recent Labs  Lab 11/26/19 1437 11/28/19 0732 12/01/19 0732  NA 137 138 137  K 4.0 3.9 4.2  CL 101 100 101  CO2 26 25 25   GLUCOSE 185* 212* 166*  BUN 35* 29* 46*  CREATININE 4.96* 4.69* 5.39*  CALCIUM 9.4 9.0 9.1  PHOS 5.8* 5.2* 6.1*   CBC Recent Labs  Lab 11/26/19 1437 11/28/19 0732 12/01/19 0722  WBC 8.6 7.5 9.6  HGB 9.2* 9.9* 10.0*  HCT 30.7* 33.1* 33.9*  MCV 88.2 89.0 89.4  PLT 263 281 299     Medications:    . aspirin EC  81 mg Oral Daily  . Chlorhexidine Gluconate Cloth  6 each Topical Q0600  . collagenase   Topical Daily  . darbepoetin (ARANESP) injection - DIALYSIS  150 mcg Intravenous Q Tue-HD  . heparin injection (subcutaneous)  5,000 Units Subcutaneous Q8H  . hydrocerin   Topical BID  . insulin aspart  0-9 Units Subcutaneous TID WC  . insulin aspart  4 Units Subcutaneous TID WC  . insulin glargine  15 Units Subcutaneous QHS  . lidocaine  1 patch Transdermal Q24H  . midodrine  10 mg Oral TID WC  . multivitamin with minerals  1 tablet Oral Daily  . polyethylene glycol  17 g Oral QHS  . pregabalin  50 mg Oral BID  . prochlorperazine  10 mg Intravenous QHS  . Ensure Max Protein  11 oz Oral BID  . sevelamer carbonate  800 mg Oral TID WC  . sodium chloride flush  3 mL Intravenous Q12H     Zachary Young  12/02/2019, 10:44 AM

## 2019-12-02 NOTE — Progress Notes (Signed)
  OT order for "shoe for mobility to LEFT foot " received and acknowledged. Per chart review order for Darco shoe has been called into [facility]. Plan to continue with current POC for OT.  Tyrone Schimke, OT Acute Rehabilitation Services Pager: [phone] Office: [phone]

## 2019-12-03 LAB — CBC
HCT: 35.2 % — ABNORMAL LOW (ref 39.0–52.0)
Hemoglobin: 10.5 g/dL — ABNORMAL LOW (ref 13.0–17.0)
MCH: 26.8 pg (ref 26.0–34.0)
MCHC: 29.8 g/dL — ABNORMAL LOW (ref 30.0–36.0)
MCV: 89.8 fL (ref 80.0–100.0)
Platelets: 342 10*3/uL (ref 150–400)
RBC: 3.92 MIL/uL — ABNORMAL LOW (ref 4.22–5.81)
RDW: 18.5 % — ABNORMAL HIGH (ref 11.5–15.5)
WBC: 8.9 10*3/uL (ref 4.0–10.5)
nRBC: 0 % (ref 0.0–0.2)

## 2019-12-03 LAB — RENAL FUNCTION PANEL
Albumin: 2.8 g/dL — ABNORMAL LOW (ref 3.5–5.0)
Anion gap: 12 (ref 5–15)
BUN: 51 mg/dL — ABNORMAL HIGH (ref 6–20)
CO2: 23 mmol/L (ref 22–32)
Calcium: 8.8 mg/dL — ABNORMAL LOW (ref 8.9–10.3)
Chloride: 101 mmol/L (ref 98–111)
Creatinine, Ser: 5.06 mg/dL — ABNORMAL HIGH (ref 0.61–1.24)
GFR calc Af Amer: 14 mL/min — ABNORMAL LOW (ref >60)
GFR calc non Af Amer: 12 mL/min — ABNORMAL LOW (ref >60)
Glucose, Bld: 242 mg/dL — ABNORMAL HIGH (ref 70–99)
Phosphorus: 5 mg/dL — ABNORMAL HIGH (ref 2.5–4.6)
Potassium: 4.2 mmol/L (ref 3.5–5.1)
Sodium: 136 mmol/L (ref 135–145)

## 2019-12-03 LAB — GLUCOSE, CAPILLARY
Glucose-Capillary: 226 mg/dL — ABNORMAL HIGH (ref 70–99)
Glucose-Capillary: 212 mg/dL — ABNORMAL HIGH (ref 70–99)
Glucose-Capillary: 199 mg/dL — ABNORMAL HIGH (ref 70–99)

## 2019-12-03 MED ORDER — SODIUM CHLORIDE 0.9 % IV SOLN: 100.0000 mL | INTRAVENOUS | Status: DC | PRN

## 2019-12-03 MED ORDER — INSULIN GLARGINE 100 UNIT/ML ~~LOC~~ SOLN
18.0000 [IU] | Freq: Every day | SUBCUTANEOUS | Status: DC
  Administered 2019-12-03: 18 [IU] via SUBCUTANEOUS
  Filled 2019-12-03 (×2): qty 0.18

## 2019-12-03 MED ORDER — ALTEPLASE 2 MG IJ SOLR: 2.0000 mg | Freq: Once | INTRAMUSCULAR | Status: DC | PRN

## 2019-12-03 MED ORDER — HEPARIN SODIUM (PORCINE) 1000 UNIT/ML DIALYSIS: 1000.0000 [IU] | INTRAMUSCULAR | Status: DC | PRN

## 2019-12-03 MED ORDER — LIDOCAINE HCL (PF) 1 % IJ SOLN: 5.0000 mL | INTRAMUSCULAR | Status: DC | PRN

## 2019-12-03 MED ORDER — PENTAFLUOROPROP-TETRAFLUOROETH EX AERO: 1.0000 "application " | INHALATION_SPRAY | CUTANEOUS | Status: DC | PRN

## 2019-12-03 MED ORDER — LIDOCAINE-PRILOCAINE 2.5-2.5 % EX CREA: 1.0000 "application " | TOPICAL_CREAM | CUTANEOUS | Status: DC | PRN

## 2019-12-03 MED ORDER — HEPARIN SODIUM (PORCINE) 1000 UNIT/ML IJ SOLN
INTRAMUSCULAR | Status: AC
  Administered 2019-12-03: 1000 [IU]
  Filled 2019-12-03: qty 4

## 2019-12-03 NOTE — Progress Notes (Signed)
  [name] Progress Note   59 y.o.yo malewho was admitted on 7/3/2021with new start to dialysis due to ESRD  Assessment/ Plan:   1. New ESRD- Has started HD on 7/23. Has TDC and AVF placed 7/30. Currently on TTS schedule, will be due tomorrow. CLIP in process but disposition is unknown at this time- many social issues "unable to procure appropriate housing- registered sex offender - family members who refuse to take him back".  We will continue to follow this issue -HD today, next hd sat 8/21 2. HTN/volume-blood pressure acceptable on dialysis now.  Midodrine as needed and dialysis 3. Anemia-hgb 10 today. darbe 150 and iron  4. Secondary hyperparathyroidism- calcium/phos OK- no meds- PTH 22-on Renvela.  Restrict phos in diet  Subjective:   Seen on hd, no complaints. Tolerating HD. uf goal 3L   Objective:   BP 127/78 (BP Location: Right Arm)   Pulse 91   Temp 97.7 F (36.5 C) (Oral)   Resp 17   Ht 6\' 3"  (1.905 m)   Wt 127.8 kg   SpO2 98%   BMI 35.22 kg/m   Intake/Output Summary (Last 24 hours) at 12/03/2019 0744 Last data filed at 12/02/2019 2119 Gross per 24 hour  Intake 243 ml  Output 800 ml  Net -557 ml   Weight change:   Physical Exam: General:Sitting up in bed, no distress Heart: Normal rate Lungs: Bilateral chest rise with no increased work of breathing Abdomen: nondistended Extremities: left TMA, rt BKA Dialysis Access: TDC plus AVF on left- Good thrill(BBT not transposed)  Imaging: No results found.  Labs: BMET Recent Labs  Lab 11/26/19 1437 11/28/19 0732 12/01/19 0732  NA 137 138 137  K 4.0 3.9 4.2  CL 101 100 101  CO2 26 25 25   GLUCOSE 185* 212* 166*  BUN 35* 29* 46*  CREATININE 4.96* 4.69* 5.39*  CALCIUM 9.4 9.0 9.1  PHOS 5.8* 5.2* 6.1*   CBC Recent Labs  Lab 11/26/19 1437 11/28/19 0732 12/01/19 0722  WBC 8.6 7.5 9.6  HGB 9.2* 9.9* 10.0*  HCT 30.7* 33.1* 33.9*  MCV 88.2 89.0 89.4  PLT 263 281 299     Medications:    . aspirin EC  81 mg Oral Daily  . Chlorhexidine Gluconate Cloth  6 each Topical Q0600  . collagenase   Topical Daily  . darbepoetin (ARANESP) injection - DIALYSIS  150 mcg Intravenous Q Tue-HD  . heparin injection (subcutaneous)  5,000 Units Subcutaneous Q8H  . hydrocerin   Topical BID  . insulin aspart  0-9 Units Subcutaneous TID WC  . insulin aspart  4 Units Subcutaneous TID WC  . insulin glargine  15 Units Subcutaneous QHS  . lidocaine  1 patch Transdermal Q24H  . midodrine  10 mg Oral TID WC  . multivitamin with minerals  1 tablet Oral Daily  . polyethylene glycol  17 g Oral QHS  . pregabalin  50 mg Oral BID  . prochlorperazine  10 mg Intravenous QHS  . Ensure Max Protein  11 oz Oral BID  . sevelamer carbonate  800 mg Oral TID WC  . sodium chloride flush  3 mL Intravenous Q12H     Zachary Young  12/03/2019, 7:44 AM

## 2019-12-03 NOTE — Progress Notes (Signed)
Patient off floor to dialysis
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
## 2019-12-03 NOTE — Procedures (Signed)
I was present at this dialysis session. I have reviewed the session itself and made appropriate changes.   Filed Weights   12/01/19 0700 12/01/19 1109 12/03/19 0715  Weight: 126 kg 124.4 kg 127.8 kg    Recent Labs  Lab 12/01/19 0732  NA 137  K 4.2  CL 101  CO2 25  GLUCOSE 166*  BUN 46*  CREATININE 5.39*  CALCIUM 9.1  PHOS 6.1*    Recent Labs  Lab 11/26/19 1437 11/28/19 0732 12/01/19 0722  WBC 8.6 7.5 9.6  HGB 9.2* 9.9* 10.0*  HCT 30.7* 33.1* 33.9*  MCV 88.2 89.0 89.4  PLT 263 281 299    Scheduled Meds: . aspirin EC  81 mg Oral Daily  . Chlorhexidine Gluconate Cloth  6 each Topical Q0600  . collagenase   Topical Daily  . darbepoetin (ARANESP) injection - DIALYSIS  150 mcg Intravenous Q Tue-HD  . heparin injection (subcutaneous)  5,000 Units Subcutaneous Q8H  . hydrocerin   Topical BID  . insulin aspart  0-9 Units Subcutaneous TID WC  . insulin aspart  4 Units Subcutaneous TID WC  . insulin glargine  15 Units Subcutaneous QHS  . lidocaine  1 patch Transdermal Q24H  . midodrine  10 mg Oral TID WC  . multivitamin with minerals  1 tablet Oral Daily  . polyethylene glycol  17 g Oral QHS  . pregabalin  50 mg Oral BID  . prochlorperazine  10 mg Intravenous QHS  . Ensure Max Protein  11 oz Oral BID  . sevelamer carbonate  800 mg Oral TID WC  . sodium chloride flush  3 mL Intravenous Q12H   Continuous Infusions: . sodium chloride 10 mL/hr at 11/13/19 0949  . sodium chloride    . sodium chloride     PRN Meds:.sodium chloride, sodium chloride, albuterol, alteplase, docusate sodium, heparin, hydrOXYzine, lactulose, lidocaine (PF), lidocaine-prilocaine, ondansetron **OR** ondansetron (ZOFRAN) IV, oxyCODONE-acetaminophen, pentafluoroprop-tetrafluoroeth, prochlorperazine, simethicone, white petrolatum   [name], MD [facility] 12/03/2019, 7:44 AM

## 2019-12-04 ENCOUNTER — Inpatient Hospital Stay (HOSPITAL_COMMUNITY): Payer: Medicaid Other

## 2019-12-04 LAB — GLUCOSE, CAPILLARY
Glucose-Capillary: 232 mg/dL — ABNORMAL HIGH (ref 70–99)
Glucose-Capillary: 219 mg/dL — ABNORMAL HIGH (ref 70–99)
Glucose-Capillary: 182 mg/dL — ABNORMAL HIGH (ref 70–99)
Glucose-Capillary: 243 mg/dL — ABNORMAL HIGH (ref 70–99)

## 2019-12-04 MED ORDER — INSULIN GLARGINE 100 UNIT/ML ~~LOC~~ SOLN
25.0000 [IU] | Freq: Every day | SUBCUTANEOUS | Status: DC
  Administered 2019-12-04: 25 [IU] via SUBCUTANEOUS
  Filled 2019-12-04 (×2): qty 0.25
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
## 2019-12-04 NOTE — Progress Notes (Signed)
Physical Therapy Treatment Patient Details Name: Zachary Young MRN: [id number] DOB: Oct 25, 1960 Today's Date: 12/04/2019    History of Present Illness Pt is a 59 y/o male s/p R transtibial amputation. PMH including but not limited to CHF, DM, HTN, L transmet amputation. Pt now s/p HD catheter placement and has started HD.     PT Comments    The pt is continuing to make progress with independence with transfers and improved use of WC parts and safety features. He will continue to benefit from supervision with transfers into the [facility], but was able to manage use of locks and positioning today with few cues. The pt is very motivated to use the [facility] to gain independence and will benefit from continued skilled PT to progress functional strength training, transfer training, and dynamic stability.     Follow Up Recommendations  SNF     Equipment Recommendations  Wheelchair (measurements PT);Wheelchair cushion (measurements PT) (amputee attachment)    Recommendations for Other Services       Precautions / Restrictions Precautions Precautions: Fall Precaution Comments: R limb protector, L darco shoe Required Braces or Orthoses: Other Brace Other Brace: R limb protector, L darco shoe Restrictions Weight Bearing Restrictions: Yes RLE Weight Bearing: Non weight bearing Other Position/Activity Restrictions: NWB R LE    Mobility  Bed Mobility Overal bed mobility: Modified Independent                Transfers Overall transfer level: Needs assistance   Transfers: Lateral/Scoot Transfers;Squat Pivot Transfers     Squat pivot transfers: Supervision    Lateral/Scoot Transfers: Supervision General transfer comment: Pt used a combination of lateral scooting and squat-pivot to transfer into WC. he was able to lock breaks, position, and remove armrest without cues, then transfer into the [facility] without assistance. Discussed where to "park" the [facility] for transfer back into bed that will be safest  for the pt  Ambulation/Gait Ambulation/Gait assistance:  (pt prefering WC mobility at thistime)               Information systems manager mobility: Yes Wheelchair propulsion: Both upper extremities Wheelchair parts: Supervision/cueing Distance: 800 ft Wheelchair Assistance Details (indicate cue type and reason): cues for safety, locks. pt able to navigate well in hallway and avoid obstacles.         Balance Overall balance assessment: Needs assistance Sitting-balance support: No upper extremity supported;Feet supported Sitting balance-Leahy Scale: Good Sitting balance - Comments: pt able to sitt EOB without UE support   Standing balance support: Bilateral upper extremity supported;During functional activity Standing balance-Leahy Scale: Poor Standing balance comment: pt was reliant on BUE support on RW                            Cognition Arousal/Alertness: Awake/alert Behavior During Therapy: Restless Overall Cognitive Status: No family/caregiver present to determine baseline cognitive functioning Area of Impairment: Safety/judgement;Problem solving                         Safety/Judgement: Decreased awareness of safety;Decreased awareness of deficits   Problem Solving: Requires verbal cues;Requires tactile cues General Comments: Pt more cooperative and less outwardly aggressive with PT compared to prior notes, moves quickly on the verge of impulsive. Does speak rudely with some staff but easily redirects with mobility             Pertinent Vitals/Pain Pain Assessment: No/denies pain Pain Intervention(s):  Monitored during session           PT Goals (current goals can now be found in the care plan section) Acute Rehab PT Goals Patient Stated Goal: getting home PT Goal Formulation: With patient Time For Goal Achievement: 12/16/19 Potential to Achieve Goals: Fair Additional Goals Additional Goal #1: Pt will  perform WC mobility >125', mod I to ensure independence with mobility. Progress towards PT goals: Progressing toward goals    Frequency    Min 3X/week      PT Plan Current plan remains appropriate       AM-PAC PT "6 Clicks" Mobility   Outcome Measure  Help needed turning from your back to your side while in a flat bed without using bedrails?: None Help needed moving from lying on your back to sitting on the side of a flat bed without using bedrails?: None Help needed moving to and from a bed to a chair (including a wheelchair)?: None Help needed standing up from a chair using your arms (e.g., wheelchair or bedside chair)?: A Little Help needed to walk in hospital room?: A Lot Help needed climbing 3-5 steps with a railing? : Total 6 Click Score: 18    End of Session   Activity Tolerance: Patient tolerated treatment well Patient left: in bed;with call bell/phone within reach;with bed alarm set Nurse Communication: Mobility status PT Visit Diagnosis: Other abnormalities of gait and mobility (R26.89) Pain - Right/Left: Right Pain - part of body: Leg     Time: 6384-5364 PT Time Calculation (min) (ACUTE ONLY): 23 min  Charges:  $Therapeutic Activity: 8-22 mins $Wheel Chair Management: 8-22 mins                     Karma Ganja, PT, DPT   Acute Rehabilitation Department Pager #: [phone]   Otho Bellows 12/04/2019, 12:07 PM

## 2019-12-04 NOTE — Progress Notes (Signed)
Occupational Therapy Treatment Patient Details Name: Carley Strickling MRN: [id number] DOB: 1961-01-16 Today's Date: 12/04/2019    History of present illness Pt is a 59 y/o male s/p R transtibial amputation. PMH including but not limited to CHF, DM, HTN, L transmet amputation. Pt now s/p HD catheter placement and has started HD.    OT comments  Pt very agitated today and verbally aggressive towards staff, but motivated to work on wheelchair mobility. Assessed pt wheelchair mobility in room during ADL tasks, in hallway and maneuvering obstacles at Supervision level. Frequent rest breaks needed due to fatigue and shoulder discomfort, though pt attributes needed rest breaks due to faulty wheelchair. Pt reports he has been using a wheelchair for years and knows how to do this. Cues needed due to impulsivity, but pt agitated with safety cues. OT goals updated to reflect progress.    Follow Up Recommendations  SNF    Equipment Recommendations  3 in 1 bedside commode;Tub/shower bench;Wheelchair (measurements OT);Wheelchair cushion (measurements OT) (drop arm)    Recommendations for Other Services      Precautions / Restrictions Precautions Precautions: Fall Precaution Comments: R limb protector, L darco shoe Required Braces or Orthoses: Other Brace Other Brace: R limb protector, L darco shoe Restrictions Weight Bearing Restrictions: Yes RLE Weight Bearing: Non weight bearing Other Position/Activity Restrictions: NWB R LE       Mobility Bed Mobility Overal bed mobility: Modified Independent Bed Mobility: Supine to Sit;Sit to Supine     Supine to sit: Modified independent (Device/Increase time) Sit to supine: Modified independent (Device/Increase time)      Transfers Overall transfer level: Needs assistance Equipment used:  (wheelchair) Transfers: Lateral/Scoot Transfers          Lateral/Scoot Transfers: Supervision General transfer comment: Supervision for wc lateral scoots x  2. cues for locking brakes with pt demo without assistance, independent removal/replacement of armrest    Balance Overall balance assessment: Needs assistance Sitting-balance support: No upper extremity supported;Feet supported Sitting balance-Leahy Scale: Good                                     ADL either performed or assessed with clinical judgement   ADL Overall ADL's : Needs assistance/impaired     Grooming: Supervision/safety;Sitting Grooming Details (indicate cue type and reason): Supervision for using mouthwash at sink wc level. Educated on accessible living situations where sink has open bottom to allow wc to slide under     Lower Body Bathing: Supervison/ safety;Bed level Lower Body Bathing Details (indicate cue type and reason): Supervision to don L darco shoe with increased time sitting EOB. Pt requesting assistance but OT encouraged pt to attempt activtities independently                     Functional mobility during ADLs: Supervision/safety;Wheelchair;Cueing for safety;Cueing for sequencing General ADL Comments: Guided pt in ADL mobility with wc, unable to access bathroom successfully in room via wc due to decreased door width. Pt very agitated today but motivated to use wheelchair     Vision   Vision Assessment?: No apparent visual deficits   Perception     Praxis      Cognition Arousal/Alertness: Awake/alert Behavior During Therapy: Agitated Overall Cognitive Status: No family/caregiver present to determine baseline cognitive functioning Area of Impairment: Safety/judgement;Problem solving  Safety/Judgement: Decreased awareness of safety;Decreased awareness of deficits Awareness: Emergent Problem Solving: Requires verbal cues;Requires tactile cues General Comments: Pt agitated throughout session, verbally aggressive towards staff. Requires safety cues but gets angry when cued for safety.         Exercises     Shoulder Instructions       General Comments Pt very agitated today, verbally aggressive towards staff, but motivated to use wheelchair. Then told therapist to never bring this wheelchair back to his room because it is hard to propel. Sister present during session    Pertinent Vitals/ Pain       Pain Assessment: Faces Faces Pain Scale: Hurts a little bit Pain Location: residual limb Pain Descriptors / Indicators: Sore Pain Intervention(s): Patient requesting pain meds-RN notified  Home Living                                          Prior Functioning/Environment              Frequency  Min 2X/week        Progress Toward Goals  OT Goals(current goals can now be found in the care plan section)  Progress towards OT goals: Progressing toward goals  Acute Rehab OT Goals Patient Stated Goal: getting home OT Goal Formulation: With patient Time For Goal Achievement: 12/11/19 Potential to Achieve Goals: Fair ADL Goals Pt Will Perform Grooming: with supervision;standing Pt Will Perform Lower Body Dressing: with modified independence;sitting/lateral leans;sit to/from stand;bed level Pt Will Transfer to Toilet: with modified independence;stand pivot transfer;bedside commode Pt Will Perform Toileting - Clothing Manipulation and hygiene: with modified independence;sit to/from stand Pt Will Perform Tub/Shower Transfer: Tub transfer;tub bench;with min assist Additional ADL Goal #1: Pt to demonstrate safe wheelchair mobility during ADLs with implementation of safety techniques  Plan Discharge plan remains appropriate    Co-evaluation                 AM-PAC OT "6 Clicks" Daily Activity     Outcome Measure   Help from another person eating meals?: None Help from another person taking care of personal grooming?: A Little Help from another person toileting, which includes using toliet, bedpan, or urinal?: A Lot Help from another person  bathing (including washing, rinsing, drying)?: A Little Help from another person to put on and taking off regular upper body clothing?: A Little Help from another person to put on and taking off regular lower body clothing?: A Little 6 Click Score: 18    End of Session Equipment Utilized During Treatment: Other (comment) (wheelchair)  OT Visit Diagnosis: Other abnormalities of gait and mobility (R26.89);Unsteadiness on feet (R26.81);Muscle weakness (generalized) (M62.81);Other symptoms and signs involving cognitive function;Pain Pain - Right/Left: Right Pain - part of body: Leg   Activity Tolerance Treatment limited secondary to agitation   Patient Left in bed;with call bell/phone within reach;with family/visitor present   Nurse Communication Mobility status;Other (comment);Patient requests pain meds (behavior)        Time: 0923-3007 OT Time Calculation (min): 28 min  Charges: OT General Charges $OT Visit: 1 Visit OT Treatments $Self Care/Home Management : 8-22 mins $Therapeutic Activity: 8-22 mins  Layla Maw, OTR/L   Layla Maw 12/04/2019, 9:05 AM
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
## 2019-12-05 LAB — RENAL FUNCTION PANEL
Albumin: 3.1 g/dL — ABNORMAL LOW (ref 3.5–5.0)
Anion gap: 16 — ABNORMAL HIGH (ref 5–15)
BUN: 57 mg/dL — ABNORMAL HIGH (ref 6–20)
CO2: 24 mmol/L (ref 22–32)
Calcium: 9.2 mg/dL (ref 8.9–10.3)
Chloride: 96 mmol/L — ABNORMAL LOW (ref 98–111)
Creatinine, Ser: 5.77 mg/dL — ABNORMAL HIGH (ref 0.61–1.24)
GFR calc Af Amer: 12 mL/min — ABNORMAL LOW (ref >60)
GFR calc non Af Amer: 10 mL/min — ABNORMAL LOW (ref >60)
Glucose, Bld: 303 mg/dL — ABNORMAL HIGH (ref 70–99)
Phosphorus: 5.9 mg/dL — ABNORMAL HIGH (ref 2.5–4.6)
Potassium: 3.8 mmol/L (ref 3.5–5.1)
Sodium: 136 mmol/L (ref 135–145)

## 2019-12-05 LAB — GLUCOSE, CAPILLARY
Glucose-Capillary: 291 mg/dL — ABNORMAL HIGH (ref 70–99)
Glucose-Capillary: 172 mg/dL — ABNORMAL HIGH (ref 70–99)
Glucose-Capillary: 160 mg/dL — ABNORMAL HIGH (ref 70–99)
Glucose-Capillary: 171 mg/dL — ABNORMAL HIGH (ref 70–99)

## 2019-12-05 LAB — CBC
HCT: 37 % — ABNORMAL LOW (ref 39.0–52.0)
Hemoglobin: 11.1 g/dL — ABNORMAL LOW (ref 13.0–17.0)
MCH: 26.6 pg (ref 26.0–34.0)
MCHC: 30 g/dL (ref 30.0–36.0)
MCV: 88.5 fL (ref 80.0–100.0)
Platelets: 344 10*3/uL (ref 150–400)
RBC: 4.18 MIL/uL — ABNORMAL LOW (ref 4.22–5.81)
RDW: 18.4 % — ABNORMAL HIGH (ref 11.5–15.5)
WBC: 7.8 10*3/uL (ref 4.0–10.5)
nRBC: 0 % (ref 0.0–0.2)

## 2019-12-05 LAB — HEPATITIS B CORE ANTIBODY, TOTAL: Hep B Core Total Ab: NONREACTIVE

## 2019-12-05 LAB — HEPATITIS B SURFACE ANTIGEN: Hepatitis B Surface Ag: NONREACTIVE

## 2019-12-05 LAB — HEPATITIS B SURFACE ANTIBODY,QUALITATIVE: Hep B S Ab: NONREACTIVE

## 2019-12-05 MED ORDER — INSULIN GLARGINE 100 UNIT/ML ~~LOC~~ SOLN
30.0000 [IU] | Freq: Every day | SUBCUTANEOUS | Status: DC
  Administered 2019-12-05: 30 [IU] via SUBCUTANEOUS
  Filled 2019-12-05 (×2): qty 0.3

## 2019-12-05 MED ORDER — INSULIN ASPART 100 UNIT/ML ~~LOC~~ SOLN
8.0000 [IU] | Freq: Three times a day (TID) | SUBCUTANEOUS | Status: DC
  Administered 2019-12-05 (×2): 8 [IU] via SUBCUTANEOUS

## 2019-12-05 MED ORDER — HEPARIN SODIUM (PORCINE) 1000 UNIT/ML IJ SOLN
INTRAMUSCULAR | Status: AC
  Filled 2019-12-05: qty 4

## 2019-12-05 MED ORDER — DARBEPOETIN ALFA 100 MCG/0.5ML IJ SOSY
100.0000 ug | PREFILLED_SYRINGE | INTRAMUSCULAR | Status: DC
  Filled 2019-12-05: qty 0.5

## 2019-12-05 NOTE — Plan of Care (Signed)
  Problem: Education: Goal: Knowledge of General Education information will improve Description: Including pain rating scale, medication(s)/side effects and non-pharmacologic comfort measures Outcome: Progressing   Problem: Clinical Measurements: Goal: Ability to maintain clinical measurements within normal limits will improve Outcome: Progressing Goal: Will remain free from infection Outcome: Progressing   Problem: Clinical Measurements: Goal: Will remain free from infection Outcome: Progressing
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
## 2019-12-06 DIAGNOSIS — R7982 Elevated C-reactive protein (CRP): Secondary | ICD-10-CM

## 2019-12-06 DIAGNOSIS — R700 Elevated erythrocyte sedimentation rate: Secondary | ICD-10-CM

## 2019-12-06 LAB — GLUCOSE, CAPILLARY
Glucose-Capillary: 212 mg/dL — ABNORMAL HIGH (ref 70–99)
Glucose-Capillary: 321 mg/dL — ABNORMAL HIGH (ref 70–99)
Glucose-Capillary: 95 mg/dL (ref 70–99)
Glucose-Capillary: 463 mg/dL — ABNORMAL HIGH (ref 70–99)

## 2019-12-06 MED ORDER — INSULIN GLARGINE 100 UNIT/ML ~~LOC~~ SOLN
35.0000 [IU] | Freq: Every day | SUBCUTANEOUS | Status: DC
  Administered 2019-12-06 – 2019-12-11 (×6): 35 [IU] via SUBCUTANEOUS
  Filled 2019-12-06 (×9): qty 0.35

## 2019-12-06 MED ORDER — INSULIN ASPART 100 UNIT/ML ~~LOC~~ SOLN
10.0000 [IU] | Freq: Three times a day (TID) | SUBCUTANEOUS | Status: DC
  Administered 2019-12-06 – 2019-12-12 (×16): 10 [IU] via SUBCUTANEOUS
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
## 2019-12-07 DIAGNOSIS — N185 Chronic kidney disease, stage 5: Secondary | ICD-10-CM

## 2019-12-07 DIAGNOSIS — Z515 Encounter for palliative care: Secondary | ICD-10-CM

## 2019-12-07 DIAGNOSIS — N179 Acute kidney failure, unspecified: Secondary | ICD-10-CM

## 2019-12-07 DIAGNOSIS — T8131XA Disruption of external operation (surgical) wound, not elsewhere classified, initial encounter: Secondary | ICD-10-CM

## 2019-12-07 LAB — CBC
HCT: 38.3 % — ABNORMAL LOW (ref 39.0–52.0)
Hemoglobin: 11.6 g/dL — ABNORMAL LOW (ref 13.0–17.0)
MCH: 25.8 pg — ABNORMAL LOW (ref 26.0–34.0)
MCHC: 30.3 g/dL (ref 30.0–36.0)
MCV: 85.3 fL (ref 80.0–100.0)
Platelets: 309 10*3/uL (ref 150–400)
RBC: 4.49 MIL/uL (ref 4.22–5.81)
RDW: 18.3 % — ABNORMAL HIGH (ref 11.5–15.5)
WBC: 8.7 10*3/uL (ref 4.0–10.5)
nRBC: 0 % (ref 0.0–0.2)

## 2019-12-07 LAB — GLUCOSE, CAPILLARY
Glucose-Capillary: 171 mg/dL — ABNORMAL HIGH (ref 70–99)
Glucose-Capillary: 186 mg/dL — ABNORMAL HIGH (ref 70–99)
Glucose-Capillary: 186 mg/dL — ABNORMAL HIGH (ref 70–99)
Glucose-Capillary: 199 mg/dL — ABNORMAL HIGH (ref 70–99)
Glucose-Capillary: 83 mg/dL (ref 70–99)

## 2019-12-07 MED ORDER — INSULIN ASPART 100 UNIT/ML ~~LOC~~ SOLN
0.0000 [IU] | Freq: Three times a day (TID) | SUBCUTANEOUS | Status: DC
  Administered 2019-12-07 (×2): 3 [IU] via SUBCUTANEOUS
  Administered 2019-12-08: 2 [IU] via SUBCUTANEOUS
  Administered 2019-12-08 – 2019-12-09 (×2): 3 [IU] via SUBCUTANEOUS
  Administered 2019-12-09: 5 [IU] via SUBCUTANEOUS
  Administered 2019-12-09 – 2019-12-10 (×2): 2 [IU] via SUBCUTANEOUS
  Administered 2019-12-10 (×2): 3 [IU] via SUBCUTANEOUS
  Administered 2019-12-11: 11 [IU] via SUBCUTANEOUS
  Administered 2019-12-11: 15 [IU] via SUBCUTANEOUS
  Administered 2019-12-11 – 2019-12-12 (×2): 2 [IU] via SUBCUTANEOUS
  Administered 2019-12-12: 3 [IU] via SUBCUTANEOUS

## 2019-12-07 MED ORDER — CHLORHEXIDINE GLUCONATE CLOTH 2 % EX PADS
6.0000 | MEDICATED_PAD | Freq: Every day | CUTANEOUS | Status: DC
  Administered 2019-12-08 – 2019-12-11 (×3): 6 via TOPICAL

## 2019-12-07 MED ORDER — INSULIN ASPART 100 UNIT/ML ~~LOC~~ SOLN
0.0000 [IU] | Freq: Every day | SUBCUTANEOUS | Status: DC
  Administered 2019-12-10: 2 [IU] via SUBCUTANEOUS

## 2019-12-07 NOTE — Progress Notes (Signed)
Physical Therapy Treatment Patient Details Name: Zachary Young MRN: [id number] DOB: 06-Feb-1961 Today's Date: 12/07/2019    History of Present Illness Pt is a 59 y/o male s/p R transtibial amputation. PMH including but not limited to CHF, DM, HTN, L transmet amputation. Pt now s/p HD catheter placement and has started HD.     PT Comments    The pt is continuing to demo good progress and independence with functional transfers and WC mobility. He was able to demo good stability with sit-stand transfers from the bed and lateral scooting between WC and recliner. The pt is also able to demo good tolerance and independence with WC mobility and navigation in the hallway, but requires supervision and some assist to set-up the [facility] and check breaks at this time to allow for safe transfers. The pt is most challenged by ambulation with use of RW and darco shoe as he had multiple LOB due to poor clearance of his LLE, and became frustrated and concerned about safety ambulating in the shoe. The pt will continue to benefit from skilled PT to progress dynamic stability and independence with transfers prior to d/c home.     Follow Up Recommendations  Supervision/Assistance - 24 hour;Home health PT (must have 24/7 supervision at this time to go home safely)     Equipment Recommendations  Wheelchair (measurements PT);Wheelchair cushion (measurements PT) (amputee attachement)    Recommendations for Other Services       Precautions / Restrictions Precautions Precautions: Fall Precaution Comments: R limb protector, L darco shoe Required Braces or Orthoses: Other Brace Other Brace: R limb protector, L darco shoe Restrictions Weight Bearing Restrictions: Yes RLE Weight Bearing: Non weight bearing Other Position/Activity Restrictions: NWB R LE    Mobility  Bed Mobility Overal bed mobility: Modified Independent Bed Mobility: Supine to Sit;Sit to Supine     Supine to sit: Modified independent  (Device/Increase time) Sit to supine: Modified independent (Device/Increase time)   General bed mobility comments: able to move, dress, and don limb protector in bed safely  Transfers Overall transfer level: Needs assistance Equipment used: Rolling walker (2 wheeled) Transfers: Sit to/from Stand Sit to Stand: Min guard         General transfer comment: Pt able to complete sit-stand with good technique from bed with RW, increased difficulty lowering to WC from standing due to low surface. The pt is able to manage removal of WC armrest for transition to recliner at end of session, but requires some assist for set up  Ambulation/Gait Ambulation/Gait assistance: Min guard;Min assist Gait Distance (Feet): 10 Feet Assistive device: Rolling walker (2 wheeled)   Gait velocity: decreased Gait velocity interpretation: <1.31 ft/sec, indicative of household ambulator General Gait Details: hop-to pattern with increased reliance on BUE. Pt with multiple LOB due to poor clearance under darco shoe and the pt became progressively more frustrated as he ambulated due to concern for his safety and L ankle.   Stairs             Information systems manager mobility: Yes Wheelchair propulsion: Both upper extremities Wheelchair parts: Supervision/cueing Distance: 2000 ft Wheelchair Assistance Details (indicate cue type and reason): cues for safety, locks. pt able to navigate well in hallway and avoid obstacles.  Modified Rankin (Stroke Patients Only)       Balance Overall balance assessment: Needs assistance Sitting-balance support: No upper extremity supported;Feet supported Sitting balance-Leahy Scale: Normal Sitting balance - Comments: pt able to sitt EOB without UE support  Standing balance support: Bilateral upper extremity supported;During functional activity Standing balance-Leahy Scale: Poor Standing balance comment: pt was reliant on BUE support on RW                             Cognition Arousal/Alertness: Awake/alert Behavior During Therapy: Restless (easily agitated) Overall Cognitive Status: No family/caregiver present to determine baseline cognitive functioning Area of Impairment: Safety/judgement                         Safety/Judgement: Decreased awareness of safety;Decreased awareness of deficits     General Comments: Pt more cooperative and less outwardly aggressive with PT compared to prior notes, moves quickly on the verge of impulsive. Does speak rudely with some staff but easily redirects with mobility      Exercises      General Comments General comments (skin integrity, edema, etc.): Pt agreeable through PT session, but became agitated and verbally aggresive with staff.      Pertinent Vitals/Pain Pain Assessment: No/denies pain Pain Intervention(s): Monitored during session           PT Goals (current goals can now be found in the care plan section) Acute Rehab PT Goals Patient Stated Goal: to go outside PT Goal Formulation: With patient Time For Goal Achievement: 12/16/19 Potential to Achieve Goals: Fair Additional Goals Additional Goal #1: Pt will perform WC mobility >125', mod I to ensure independence with mobility. Progress towards PT goals: Progressing toward goals    Frequency    Min 3X/week      PT Plan Discharge plan needs to be updated       AM-PAC PT "6 Clicks" Mobility   Outcome Measure  Help needed turning from your back to your side while in a flat bed without using bedrails?: None Help needed moving from lying on your back to sitting on the side of a flat bed without using bedrails?: None Help needed moving to and from a bed to a chair (including a wheelchair)?: None Help needed standing up from a chair using your arms (e.g., wheelchair or bedside chair)?: A Little Help needed to walk in hospital room?: A Lot Help needed climbing 3-5 steps with a railing? :  Total 6 Click Score: 18    End of Session Equipment Utilized During Treatment: Gait belt Activity Tolerance: Patient tolerated treatment well Patient left: with call bell/phone within reach;in chair;with nursing/sitter in room Nurse Communication: Mobility status PT Visit Diagnosis: Other abnormalities of gait and mobility (R26.89) Pain - Right/Left: Right Pain - part of body: Leg     Time: 4132-4401 PT Time Calculation (min) (ACUTE ONLY): 30 min  Charges:  $Gait Training: 8-22 mins $Wheel Chair Management: 8-22 mins                     Karma Ganja, PT, DPT   Acute Rehabilitation Department Pager #: [phone]   Otho Bellows 12/07/2019, 1:31 PM

## 2019-12-07 NOTE — Progress Notes (Signed)
[facility] NEPHROLOGY PROGRESS NOTE  Assessment/ Plan: Pt is a 59 y.o. yo male with history of CKD stage IV followed at [facility], DM, CHF, hypertension, right BKA now with new ESRD on dialysis.  #New ESRD started dialysis on 7/23.  He has AV fistula placed and catheter changed to [facility] on 7/30.  Currently undergoing TTS schedule dialysis.  Difficult disposition because of multiple social issues.  Renal navigator is following.  Plan for next HD tomorrow.  # Anemia of CKD: Hemoglobin at goal.  Continue ESA, dose reduced.  # Secondary hyperparathyroidism: Continue binders.  Monitor calcium phosphorus level.  # HTN/volume: Blood pressure acceptable.  Requiring midodrine for intradialytic hypotension.  Subjective: Seen and examined at bedside.  Denies nausea vomiting chest pain shortness of breath.  No new event. Objective Vital signs in last 24 hours: Vitals:   12/06/19 1802 12/06/19 2251 12/07/19 0631 12/07/19 1203  BP: 128/76 134/70 108/64 (!) 144/62  Pulse: 80 93 71 76  Resp: 18 18 18 16   Temp: 98.1 F (36.7 C) 98.3 F (36.8 C) 98.4 F (36.9 C) 98.6 F (37 C)  TempSrc: Oral Oral Oral Oral  SpO2: 98% 98% 99% 96%  Weight:      Height:       Weight change:   Intake/Output Summary (Last 24 hours) at 12/07/2019 1657 Last data filed at 12/06/2019 2304 Gross per 24 hour  Intake --  Output 200 ml  Net -200 ml       Labs: Basic Metabolic Panel: Recent Labs  Lab 12/01/19 0732 12/03/19 0739 12/05/19 0722  NA 137 136 136  K 4.2 4.2 3.8  CL 101 101 96*  CO2 25 23 24   GLUCOSE 166* 242* 303*  BUN 46* 51* 57*  CREATININE 5.39* 5.06* 5.77*  CALCIUM 9.1 8.8* 9.2  PHOS 6.1* 5.0* 5.9*   Liver Function Tests: Recent Labs  Lab 12/01/19 0732 12/03/19 0739 12/05/19 0722  ALBUMIN 2.8* 2.8* 3.1*   No results for input(s): LIPASE, AMYLASE in the last 168 hours. No results for input(s): AMMONIA in the last 168 hours. CBC: Recent Labs  Lab 12/01/19 0722  12/01/19 0722 12/03/19 0739 12/05/19 0722 12/07/19 0835  WBC 9.6   < > 8.9 7.8 8.7  HGB 10.0*   < > 10.5* 11.1* 11.6*  HCT 33.9*   < > 35.2* 37.0* 38.3*  MCV 89.4  --  89.8 88.5 85.3  PLT 299   < > 342 344 309   < > = values in this interval not displayed.   Cardiac Enzymes: No results for input(s): CKTOTAL, CKMB, CKMBINDEX, TROPONINI in the last 168 hours. CBG: Recent Labs  Lab 12/06/19 2233 12/07/19 0006 12/07/19 0630 12/07/19 1103 12/07/19 1605  GLUCAP 463* 186* 83 186* 171*    Iron Studies: No results for input(s): IRON, TIBC, TRANSFERRIN, FERRITIN in the last 72 hours. Studies/Results: No results found.  Medications: Infusions: . sodium chloride 10 mL/hr at 11/13/19 0949    Scheduled Medications: . aspirin EC  81 mg Oral Daily  . Chlorhexidine Gluconate Cloth  6 each Topical Q0600  . collagenase   Topical Daily  . [START ON 12/08/2019] darbepoetin (ARANESP) injection - DIALYSIS  100 mcg Intravenous Q Tue-HD  . heparin injection (subcutaneous)  5,000 Units Subcutaneous Q8H  . hydrocerin   Topical BID  . insulin aspart  0-15 Units Subcutaneous TID WC  . insulin aspart  0-5 Units Subcutaneous QHS  . insulin aspart  10 Units Subcutaneous TID WC  .  insulin glargine  35 Units Subcutaneous QHS  . lidocaine  1 patch Transdermal Q24H  . midodrine  10 mg Oral TID WC  . multivitamin with minerals  1 tablet Oral Daily  . pregabalin  50 mg Oral BID  . prochlorperazine  10 mg Intravenous QHS  . Ensure Max Protein  11 oz Oral BID  . sevelamer carbonate  800 mg Oral TID WC    have reviewed scheduled and prn medications.  Physical Exam: General:NAD, comfortable Heart:RRR, s1s2 nl Lungs:clear b/l, no crackle Abdomen:soft, Non-tender, non-distended Extremities: Right BKA. Dialysis Access: [facility] and left upper extremity AV fistula has good thrill and bruit.  Shanetta Nicolls Tanna Furry 12/07/2019,4:57 PM  LOS: 51 days  Pager: [phone]
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
## 2019-12-07 NOTE — Progress Notes (Signed)
Occupational Therapy Treatment Patient Details Name: Zachary Young MRN: [id number] DOB: July 10, 1960 Today's Date: 12/07/2019    History of present illness Pt is a 59 y/o male s/p R transtibial amputation. PMH including but not limited to CHF, DM, HTN, L transmet amputation. Pt now s/p HD catheter placement and has started HD.    OT comments  Pt progressing with OT goals. Pt calm and cooperative today though reports desire to perform activities from wheelchair level (reports he plans to do this at home too). Guided pt in short distance mobility with RW to/from sink at min guard without LOB. Pt declined need to complete any tasks at sink and requested to return back to bed. Educated pt on current functional abilities and areas OT needs to address to ensure safety with ADLs. Plan to address ADLs from wc level, as well as pt's desire to go outside as appropriate during next session.    Follow Up Recommendations  Home health OT;Supervision/Assistance - 24 hour (cannot safely DC without 24/7 supervision at this time)    Equipment Recommendations  3 in 1 bedside commode;Tub/shower bench    Recommendations for Other Services      Precautions / Restrictions Precautions Precautions: Fall Precaution Comments: R limb protector, L darco shoe Required Braces or Orthoses: Other Brace Other Brace: R limb protector, L darco shoe Restrictions Weight Bearing Restrictions: Yes RLE Weight Bearing: Non weight bearing Other Position/Activity Restrictions: NWB R LE       Mobility Bed Mobility Overal bed mobility: Modified Independent Bed Mobility: Supine to Sit;Sit to Supine     Supine to sit: Modified independent (Device/Increase time) Sit to supine: Modified independent (Device/Increase time)   General bed mobility comments: able to move, dress, and don limb protector in bed safely  Transfers Overall transfer level: Needs assistance Equipment used: Rolling walker (2 wheeled) Transfers: Sit  to/from [facility] Sit to Stand: Min guard Stand pivot transfers: Min guard       General transfer comment: Min guard for sit to stand with RW from elevated bed, min guard for short distance mobility to/from sink with RW and stand pivot return back to bed.    Balance Overall balance assessment: Needs assistance Sitting-balance support: No upper extremity supported;Feet supported Sitting balance-Leahy Scale: Normal Sitting balance - Comments: pt able to sitt EOB without UE support   Standing balance support: Bilateral upper extremity supported;During functional activity Standing balance-Leahy Scale: Poor Standing balance comment: pt was reliant on BUE support on RW                           ADL either performed or assessed with clinical judgement   ADL                                       Functional mobility during ADLs: Min guard;Rolling walker General ADL Comments: Guided pt in ADL mobility in room with RW, improving balance with B UE support with hopping to sink. When asked about how he plans to bathe self at home (I.e tub transfer), he reports plan to sponge bathe until he receives a prosthetic LE.      Vision   Vision Assessment?: No apparent visual deficits   Perception     Praxis      Cognition Arousal/Alertness: Awake/alert Behavior During Therapy: Restless Overall Cognitive Status: No family/caregiver present to determine  baseline cognitive functioning Area of Impairment: Safety/judgement                         Safety/Judgement: Decreased awareness of safety;Decreased awareness of deficits Awareness: Emergent Problem Solving: Requires verbal cues;Requires tactile cues General Comments: Pt more cooperative and decreased agitation today.         Exercises     Shoulder Instructions       General Comments Pt initially resistant to participate due to OT did not bring WC back to room (as requested last  session). Pt receptive and calm during conversation of ADLs, current functional abilities and what he needs to be able to do to DC home safely    Pertinent Vitals/ Pain       Pain Assessment: Faces Faces Pain Scale: No hurt Pain Intervention(s): Monitored during session  Home Living                                          Prior Functioning/Environment              Frequency  Min 2X/week        Progress Toward Goals  OT Goals(current goals can now be found in the care plan section)  Progress towards OT goals: Progressing toward goals  Acute Rehab OT Goals Patient Stated Goal: to go outside OT Goal Formulation: With patient Time For Goal Achievement: 12/11/19 Potential to Achieve Goals: Fair ADL Goals Pt Will Perform Grooming: with supervision;standing Pt Will Perform Lower Body Dressing: with modified independence;sitting/lateral leans;sit to/from stand;bed level Pt Will Transfer to Toilet: with modified independence;stand pivot transfer;bedside commode Pt Will Perform Toileting - Clothing Manipulation and hygiene: with modified independence;sit to/from stand Pt Will Perform Tub/Shower Transfer: Tub transfer;tub bench;with min assist Additional ADL Goal #1: Pt to demonstrate safe wheelchair mobility during ADLs with implementation of safety techniques  Plan Discharge plan needs to be updated    Co-evaluation                 AM-PAC OT "6 Clicks" Daily Activity     Outcome Measure   Help from another person eating meals?: None Help from another person taking care of personal grooming?: A Little Help from another person toileting, which includes using toliet, bedpan, or urinal?: A Little Help from another person bathing (including washing, rinsing, drying)?: A Little Help from another person to put on and taking off regular upper body clothing?: A Little Help from another person to put on and taking off regular lower body clothing?: A  Little 6 Click Score: 19    End of Session Equipment Utilized During Treatment: Gait belt;Rolling walker  OT Visit Diagnosis: Other abnormalities of gait and mobility (R26.89);Unsteadiness on feet (R26.81);Muscle weakness (generalized) (M62.81);Other symptoms and signs involving cognitive function;Pain Pain - Right/Left: Right Pain - part of body: Leg   Activity Tolerance Patient tolerated treatment well   Patient Left in bed;with call bell/phone within reach   Nurse Communication          Time: 5361-4431 OT Time Calculation (min): 15 min  Charges: OT General Charges $OT Visit: 1 Visit OT Treatments $Therapeutic Activity: 8-22 mins  Layla Maw, OTR/L   Layla Maw 12/07/2019, 2:49 PM

## 2019-12-08 LAB — RENAL FUNCTION PANEL
Albumin: 3.2 g/dL — ABNORMAL LOW (ref 3.5–5.0)
Anion gap: 15 (ref 5–15)
BUN: 81 mg/dL — ABNORMAL HIGH (ref 6–20)
CO2: 27 mmol/L (ref 22–32)
Calcium: 9.3 mg/dL (ref 8.9–10.3)
Chloride: 93 mmol/L — ABNORMAL LOW (ref 98–111)
Creatinine, Ser: 6.89 mg/dL — ABNORMAL HIGH (ref 0.61–1.24)
GFR calc Af Amer: 9 mL/min — ABNORMAL LOW (ref >60)
GFR calc non Af Amer: 8 mL/min — ABNORMAL LOW (ref >60)
Glucose, Bld: 134 mg/dL — ABNORMAL HIGH (ref 70–99)
Phosphorus: 8 mg/dL — ABNORMAL HIGH (ref 2.5–4.6)
Potassium: 3.9 mmol/L (ref 3.5–5.1)
Sodium: 135 mmol/L (ref 135–145)

## 2019-12-08 LAB — CBC
HCT: 35.7 % — ABNORMAL LOW (ref 39.0–52.0)
Hemoglobin: 10.7 g/dL — ABNORMAL LOW (ref 13.0–17.0)
MCH: 26 pg (ref 26.0–34.0)
MCHC: 30 g/dL (ref 30.0–36.0)
MCV: 86.9 fL (ref 80.0–100.0)
Platelets: 340 10*3/uL (ref 150–400)
RBC: 4.11 MIL/uL — ABNORMAL LOW (ref 4.22–5.81)
RDW: 17.9 % — ABNORMAL HIGH (ref 11.5–15.5)
WBC: 8.1 10*3/uL (ref 4.0–10.5)
nRBC: 0 % (ref 0.0–0.2)

## 2019-12-08 LAB — GLUCOSE, CAPILLARY
Glucose-Capillary: 133 mg/dL — ABNORMAL HIGH (ref 70–99)
Glucose-Capillary: 107 mg/dL — ABNORMAL HIGH (ref 70–99)
Glucose-Capillary: 191 mg/dL — ABNORMAL HIGH (ref 70–99)
Glucose-Capillary: 118 mg/dL — ABNORMAL HIGH (ref 70–99)

## 2019-12-08 MED ORDER — ALTEPLASE 2 MG IJ SOLR
2.0000 mg | Freq: Once | INTRAMUSCULAR | Status: AC | PRN
  Administered 2019-12-08: 2 mg
  Filled 2019-12-08: qty 2

## 2019-12-08 MED ORDER — SODIUM CHLORIDE 0.9 % IV SOLN: 100.0000 mL | INTRAVENOUS | Status: DC | PRN

## 2019-12-08 MED ORDER — LIDOCAINE-PRILOCAINE 2.5-2.5 % EX CREA: 1.0000 "application " | TOPICAL_CREAM | CUTANEOUS | Status: DC | PRN

## 2019-12-08 MED ORDER — HEPARIN SODIUM (PORCINE) 1000 UNIT/ML DIALYSIS
1000.0000 [IU] | INTRAMUSCULAR | Status: DC | PRN
  Filled 2019-12-08: qty 1

## 2019-12-08 MED ORDER — HEPARIN SODIUM (PORCINE) 1000 UNIT/ML DIALYSIS
20.0000 [IU]/kg | INTRAMUSCULAR | Status: DC | PRN
  Filled 2019-12-08: qty 3

## 2019-12-08 MED ORDER — PENTAFLUOROPROP-TETRAFLUOROETH EX AERO: 1.0000 "application " | INHALATION_SPRAY | CUTANEOUS | Status: DC | PRN

## 2019-12-08 MED ORDER — ALTEPLASE 2 MG IJ SOLR
INTRAMUSCULAR | Status: AC
  Administered 2019-12-08: 2 mg
  Filled 2019-12-08: qty 4

## 2019-12-08 MED ORDER — LIDOCAINE HCL (PF) 1 % IJ SOLN: 5.0000 mL | INTRAMUSCULAR | Status: DC | PRN

## 2019-12-08 NOTE — Progress Notes (Signed)
[facility] NEPHROLOGY PROGRESS NOTE  Assessment/ Plan: Pt is a 59 y.o. yo male with history of CKD stage IV followed at [facility], DM, CHF, hypertension, right BKA now with new ESRD on dialysis.  #New ESRD started dialysis on 7/23.  He has AV fistula placed and catheter changed to [facility] on 7/30.  Currently undergoing TTS schedule dialysis.  Difficult disposition because of multiple social issues.  Renal navigator is following.  HD today, tolerating well.  # Anemia of CKD: Hemoglobin at goal.  Continue ESA, dose reduced.  # Secondary hyperparathyroidism: Continue binders.  Monitor calcium, phosphorus level.  # HTN/volume: Blood pressure acceptable.  Requiring midodrine for intradialytic hypotension.  Subjective: Seen and examined.  Tolerating dialysis well.  Denies nausea vomiting chest pain shortness of breath.  Objective Vital signs in last 24 hours: Vitals:   12/08/19 0845 12/08/19 0915 12/08/19 0945 12/08/19 1015  BP: 130/73 (!) 149/79 129/80 127/75  Pulse: 77 79 83 83  Resp: 15     Temp:      TempSrc:      SpO2:   100%   Weight:      Height:       Weight change:   Intake/Output Summary (Last 24 hours) at 12/08/2019 1213 Last data filed at 12/08/2019 0314 Gross per 24 hour  Intake 240 ml  Output 250 ml  Net -10 ml       Labs: Basic Metabolic Panel: Recent Labs  Lab 12/03/19 0739 12/05/19 0722 12/08/19 0853  NA 136 136 135  K 4.2 3.8 3.9  CL 101 96* 93*  CO2 23 24 27   GLUCOSE 242* 303* 134*  BUN 51* 57* 81*  CREATININE 5.06* 5.77* 6.89*  CALCIUM 8.8* 9.2 9.3  PHOS 5.0* 5.9* 8.0*   Liver Function Tests: Recent Labs  Lab 12/03/19 0739 12/05/19 0722 12/08/19 0853  ALBUMIN 2.8* 3.1* 3.2*   No results for input(s): LIPASE, AMYLASE in the last 168 hours. No results for input(s): AMMONIA in the last 168 hours. CBC: Recent Labs  Lab 12/03/19 0739 12/03/19 0739 12/05/19 0722 12/07/19 0835 12/08/19 0853  WBC 8.9   < > 7.8 8.7 8.1  HGB 10.5*    < > 11.1* 11.6* 10.7*  HCT 35.2*   < > 37.0* 38.3* 35.7*  MCV 89.8  --  88.5 85.3 86.9  PLT 342   < > 344 309 340   < > = values in this interval not displayed.   Cardiac Enzymes: No results for input(s): CKTOTAL, CKMB, CKMBINDEX, TROPONINI in the last 168 hours. CBG: Recent Labs  Lab 12/07/19 0630 12/07/19 1103 12/07/19 1605 12/07/19 2057 12/08/19 0629  GLUCAP 83 186* 171* 199* 133*    Iron Studies: No results for input(s): IRON, TIBC, TRANSFERRIN, FERRITIN in the last 72 hours. Studies/Results: No results found.  Medications: Infusions: . sodium chloride 10 mL/hr at 11/13/19 0949  . sodium chloride    . sodium chloride      Scheduled Medications: . alteplase      . aspirin EC  81 mg Oral Daily  . Chlorhexidine Gluconate Cloth  6 each Topical Q0600  . collagenase   Topical Daily  . darbepoetin (ARANESP) injection - DIALYSIS  100 mcg Intravenous Q Tue-HD  . heparin injection (subcutaneous)  5,000 Units Subcutaneous Q8H  . hydrocerin   Topical BID  . insulin aspart  0-15 Units Subcutaneous TID WC  . insulin aspart  0-5 Units Subcutaneous QHS  . insulin aspart  10 Units Subcutaneous  TID WC  . insulin glargine  35 Units Subcutaneous QHS  . lidocaine  1 patch Transdermal Q24H  . midodrine  10 mg Oral TID WC  . multivitamin with minerals  1 tablet Oral Daily  . pregabalin  50 mg Oral BID  . prochlorperazine  10 mg Intravenous QHS  . Ensure Max Protein  11 oz Oral BID  . sevelamer carbonate  800 mg Oral TID WC    have reviewed scheduled and prn medications.  Physical Exam: General:NAD, comfortable Heart:RRR, s1s2 nl Lungs:clear b/l, no crackle Abdomen:soft, Non-tender, non-distended Extremities: Right BKA. Dialysis Access: [facility] and left upper extremity AV fistula has good thrill and bruit.  Osie Bond Taylah Dubiel 12/08/2019,12:13 PM  LOS: 52 days  Pager: [phone]

## 2019-12-08 NOTE — Progress Notes (Signed)
Orthopedic Tech Progress Note Patient Details:  Zachary Young 30-Aug-1960 [id number] Called in order to [employer] for 3XL SHRINKERs Patient ID: Zachary Young, male   DOB: 08-Dec-1960, 59 y.o.   MRN: [id number]   Zachary Young 12/08/2019, 8:09 AM
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
## 2019-12-08 NOTE — Progress Notes (Signed)
Patient is status post right below-knee amputation.  Asked to evaluate small ulcer.  Patient is unfriendly and belligerent does not want me to remove his dressing  Vital signs stable afebrile wound is completely healed with the exception of a quarter sized wound dehiscence.  There is does not probe deeply there is some central fibrinous tissue.  No surrounding cellulitis no foul odor no sign of an infective process   Staples will be removed today.  Can do daily dressing changes to the one area ideally the shrinker should be against the skin however patient currently only has one shrinker will order more through [employer]

## 2019-12-08 NOTE — Progress Notes (Signed)
Client left unit heading to HD. Pt stable upon leaving. Vitals WDL. Pt had no complaints.

## 2019-12-09 LAB — GLUCOSE, CAPILLARY
Glucose-Capillary: 142 mg/dL — ABNORMAL HIGH (ref 70–99)
Glucose-Capillary: 192 mg/dL — ABNORMAL HIGH (ref 70–99)
Glucose-Capillary: 239 mg/dL — ABNORMAL HIGH (ref 70–99)
Glucose-Capillary: 201 mg/dL — ABNORMAL HIGH (ref 70–99)

## 2019-12-09 MED ORDER — CHLORHEXIDINE GLUCONATE CLOTH 2 % EX PADS
6.0000 | MEDICATED_PAD | Freq: Every day | CUTANEOUS | Status: DC
  Administered 2019-12-09 – 2019-12-11 (×2): 6 via TOPICAL

## 2019-12-09 MED ORDER — SEVELAMER CARBONATE 800 MG PO TABS
1600.0000 mg | ORAL_TABLET | Freq: Three times a day (TID) | ORAL | Status: DC
  Administered 2019-12-09 – 2019-12-12 (×7): 1600 mg via ORAL
  Filled 2019-12-09 (×8): qty 2
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
## 2019-12-09 NOTE — Progress Notes (Signed)
[facility] NEPHROLOGY PROGRESS NOTE  Assessment/ Plan: Pt is a 59 y.o. yo male with history of CKD stage IV followed at [facility], DM, CHF, hypertension, right BKA now with new ESRD on dialysis.  #New ESRD started dialysis on 7/23.  He has AV fistula placed and catheter changed to [facility] on 7/30.  Currently undergoing TTS schedule dialysis.  Difficult disposition because of multiple social issues.  Renal navigator is following.  HD yesterday with 2.6 L UF, plan for next HD tomorrow.  # Anemia of CKD: Hemoglobin at goal.  Continue ESA, dose reduced.  # Secondary hyperparathyroidism: Increase sevelamer to 2 pills with meals, monitor phosphorus level.  PTH 22 in 10/2019.   # HTN/volume: Blood pressure acceptable.  Requiring midodrine for intradialytic hypotension.  Subjective: Seen and examined. Denies nausea vomiting chest pain shortness of breath.  Objective Vital signs in last 24 hours: Vitals:   12/08/19 1210 12/08/19 1306 12/08/19 1610 12/08/19 2155  BP: (!) 166/79 (!) 133/58 (!) 149/65 (!) 142/60  Pulse: 79 83 76 79  Resp: 19 16 16 17   Temp: 97.9 F (36.6 C) 97.8 F (36.6 C) 98.7 F (37.1 C) 98.2 F (36.8 C)  TempSrc: Oral Oral Oral Oral  SpO2: 95% 94% 100% 100%  Weight: 123 kg     Height:       Weight change:   Intake/Output Summary (Last 24 hours) at 12/09/2019 1155 Last data filed at 12/09/2019 1151 Gross per 24 hour  Intake 810 ml  Output 3860 ml  Net -3050 ml       Labs: Basic Metabolic Panel: Recent Labs  Lab 12/03/19 0739 12/05/19 0722 12/08/19 0853  NA 136 136 135  K 4.2 3.8 3.9  CL 101 96* 93*  CO2 23 24 27   GLUCOSE 242* 303* 134*  BUN 51* 57* 81*  CREATININE 5.06* 5.77* 6.89*  CALCIUM 8.8* 9.2 9.3  PHOS 5.0* 5.9* 8.0*   Liver Function Tests: Recent Labs  Lab 12/03/19 0739 12/05/19 0722 12/08/19 0853  ALBUMIN 2.8* 3.1* 3.2*   No results for input(s): LIPASE, AMYLASE in the last 168 hours. No results for input(s): AMMONIA in the  last 168 hours. CBC: Recent Labs  Lab 12/03/19 0739 12/03/19 0739 12/05/19 0722 12/07/19 0835 12/08/19 0853  WBC 8.9   < > 7.8 8.7 8.1  HGB 10.5*   < > 11.1* 11.6* 10.7*  HCT 35.2*   < > 37.0* 38.3* 35.7*  MCV 89.8  --  88.5 85.3 86.9  PLT 342   < > 344 309 340   < > = values in this interval not displayed.   Cardiac Enzymes: No results for input(s): CKTOTAL, CKMB, CKMBINDEX, TROPONINI in the last 168 hours. CBG: Recent Labs  Lab 12/08/19 1305 12/08/19 1608 12/08/19 2151 12/09/19 0630 12/09/19 1113  GLUCAP 107* 191* 118* 142* 192*    Iron Studies: No results for input(s): IRON, TIBC, TRANSFERRIN, FERRITIN in the last 72 hours. Studies/Results: No results found.  Medications: Infusions: . sodium chloride 10 mL/hr at 11/13/19 0949    Scheduled Medications: . aspirin EC  81 mg Oral Daily  . Chlorhexidine Gluconate Cloth  6 each Topical Q0600  . collagenase   Topical Daily  . darbepoetin (ARANESP) injection - DIALYSIS  100 mcg Intravenous Q Tue-HD  . heparin injection (subcutaneous)  5,000 Units Subcutaneous Q8H  . hydrocerin   Topical BID  . insulin aspart  0-15 Units Subcutaneous TID WC  . insulin aspart  0-5 Units Subcutaneous QHS  .  insulin aspart  10 Units Subcutaneous TID WC  . insulin glargine  35 Units Subcutaneous QHS  . lidocaine  1 patch Transdermal Q24H  . midodrine  10 mg Oral TID WC  . multivitamin with minerals  1 tablet Oral Daily  . pregabalin  50 mg Oral BID  . prochlorperazine  10 mg Intravenous QHS  . Ensure Max Protein  11 oz Oral BID  . sevelamer carbonate  800 mg Oral TID WC    have reviewed scheduled and prn medications.  Physical Exam: General:NAD, comfortable Heart:RRR, s1s2 nl Lungs:clear b/l, no crackle Abdomen:soft, Non-tender, non-distended Extremities: Right BKA. Dialysis Access: [facility] and left upper extremity AV fistula has good thrill and bruit.  Zachary Young Sakiyah Shur 12/09/2019,11:55 AM  LOS: 53 days  Pager:  [phone]

## 2019-12-09 NOTE — Progress Notes (Signed)
Physical Therapy Treatment Patient Details Name: Zachary Young MRN: [id number] DOB: 01-Feb-1961 Today's Date: 12/09/2019    History of Present Illness Pt is a 59 y/o male s/p R transtibial amputation. PMH including but not limited to CHF, DM, HTN, L transmet amputation. Pt now s/p HD catheter placement and has started HD.     PT Comments    Patient received in bed, agrees to PT session. Now has order that okay to leave unit with PT/nursing. Patient is mod independent with bed mobility. Dons stump protector independently. He is able to perform lateral scoot/pivot transfer from bed to wheelchair independently with supervision. Cues for locking wheelchair. He performed wheelchair mobility throughout hospital, on/off elevator, outside. Demonstrates good negotiation with wheelchair mobility. Once back in room he performed sit to stand transfer with min guard, ambulated 15 feet with RW with min guard. He has progressed to the point where he could go home. Most PT goals have been met.  He will continue to benefit from skilled PT to ensure safety with transfers and gait.       Follow Up Recommendations  Supervision/Assistance - 24 hour;Home health PT     Equipment Recommendations  Wheelchair (measurements PT);Wheelchair cushion (measurements PT)    Recommendations for Other Services       Precautions / Restrictions Precautions Precautions: Fall Precaution Comments: R limb protector, L darco shoe Other Brace: R limb protector, L darco shoe Restrictions Weight Bearing Restrictions: Yes RLE Weight Bearing: Non weight bearing Other Position/Activity Restrictions: NWB R LE    Mobility  Bed Mobility Overal bed mobility: Modified Independent Bed Mobility: Supine to Sit     Supine to sit: Modified independent (Device/Increase time)     General bed mobility comments: able to move, dress, and don limb protector in bed safely  Transfers Overall transfer level: Needs assistance Equipment  used: Rolling walker (2 wheeled) Transfers: Sit to/from Stand Sit to Stand: Min guard        Lateral/Scoot Transfers: Supervision General transfer comment: Min guard/supervision for sit to stand with RW, ambulation to door and back to bed. Declined further ambulation. Does not want to wear darco shoe.  Ambulation/Gait Ambulation/Gait assistance: Min guard Gait Distance (Feet): 15 Feet Assistive device: Rolling walker (2 wheeled) Gait Pattern/deviations: Step-to pattern Gait velocity: decr   General Gait Details: Patient requires min guard only when darco shoe is not donned.   Stairs             Information systems manager mobility: Yes Wheelchair propulsion: Both upper extremities Wheelchair parts: Supervision/cueing Distance: 2000 ft at least. We went outside main entrance and back to room. Wheelchair Assistance Details (indicate cue type and reason): cues for safety, locks. pt able to navigate well in hallway and avoid obstacles.  Modified Rankin (Stroke Patients Only)       Balance Overall balance assessment: Needs assistance Sitting-balance support: No upper extremity supported;Feet supported Sitting balance-Leahy Scale: Normal Sitting balance - Comments: pt able to sitt EOB without UE support   Standing balance support: Bilateral upper extremity supported;During functional activity Standing balance-Leahy Scale: Fair Standing balance comment: pt was reliant on BUE support on RW                            Cognition Arousal/Alertness: Awake/alert Behavior During Therapy: [facility] for tasks assessed/performed;Impulsive Overall Cognitive Status: No family/caregiver present to determine baseline cognitive functioning  Following Commands: Follows one step commands consistently       General Comments: Patient pleasant, cooperative      Exercises      General Comments        Pertinent  Vitals/Pain Pain Assessment: Faces Faces Pain Scale: No hurt Pain Location: residual limb    Home Living                      Prior Function            PT Goals (current goals can now be found in the care plan section) Acute Rehab PT Goals Patient Stated Goal: to go outside PT Goal Formulation: With patient Time For Goal Achievement: 12/16/19 Potential to Achieve Goals: Good Additional Goals Additional Goal #1: Pt will perform WC mobility >125', mod I to ensure independence with mobility. Goal met Progress towards PT goals: Progressing toward goals    Frequency    Min 3X/week      PT Plan Current plan remains appropriate    Co-evaluation              AM-PAC PT "6 Clicks" Mobility   Outcome Measure  Help needed turning from your back to your side while in a flat bed without using bedrails?: None Help needed moving from lying on your back to sitting on the side of a flat bed without using bedrails?: None Help needed moving to and from a bed to a chair (including a wheelchair)?: None Help needed standing up from a chair using your arms (e.g., wheelchair or bedside chair)?: None Help needed to walk in hospital room?: A Little Help needed climbing 3-5 steps with a railing? : Total 6 Click Score: 20    End of Session Equipment Utilized During Treatment: Gait belt Activity Tolerance: Patient tolerated treatment well Patient left: with call bell/phone within reach;in chair;with nursing/sitter in room Nurse Communication: Mobility status PT Visit Diagnosis: Other abnormalities of gait and mobility (R26.89)     Time: 5027-7412 PT Time Calculation (min) (ACUTE ONLY): 35 min  Charges:  $Therapeutic Exercise: 23-37 mins                     Karmyn Lowman, PT, GCS 12/09/19,12:56 PM

## 2019-12-10 ENCOUNTER — Other Ambulatory Visit: Payer: Self-pay

## 2019-12-10 DIAGNOSIS — N184 Chronic kidney disease, stage 4 (severe): Secondary | ICD-10-CM

## 2019-12-10 LAB — CBC
HCT: 36.3 % — ABNORMAL LOW (ref 39.0–52.0)
Hemoglobin: 10.7 g/dL — ABNORMAL LOW (ref 13.0–17.0)
MCH: 25.7 pg — ABNORMAL LOW (ref 26.0–34.0)
MCHC: 29.5 g/dL — ABNORMAL LOW (ref 30.0–36.0)
MCV: 87.1 fL (ref 80.0–100.0)
Platelets: 304 10*3/uL (ref 150–400)
RBC: 4.17 MIL/uL — ABNORMAL LOW (ref 4.22–5.81)
RDW: 17.3 % — ABNORMAL HIGH (ref 11.5–15.5)
WBC: 9.1 10*3/uL (ref 4.0–10.5)
nRBC: 0 % (ref 0.0–0.2)

## 2019-12-10 LAB — GLUCOSE, CAPILLARY
Glucose-Capillary: 123 mg/dL — ABNORMAL HIGH (ref 70–99)
Glucose-Capillary: 151 mg/dL — ABNORMAL HIGH (ref 70–99)
Glucose-Capillary: 106 mg/dL — ABNORMAL HIGH (ref 70–99)

## 2019-12-10 LAB — RENAL FUNCTION PANEL
Albumin: 3.2 g/dL — ABNORMAL LOW (ref 3.5–5.0)
Anion gap: 12 (ref 5–15)
BUN: 73 mg/dL — ABNORMAL HIGH (ref 6–20)
CO2: 27 mmol/L (ref 22–32)
Calcium: 9.3 mg/dL (ref 8.9–10.3)
Chloride: 98 mmol/L (ref 98–111)
Creatinine, Ser: 6.23 mg/dL — ABNORMAL HIGH (ref 0.61–1.24)
GFR calc Af Amer: 11 mL/min — ABNORMAL LOW (ref >60)
GFR calc non Af Amer: 9 mL/min — ABNORMAL LOW (ref >60)
Glucose, Bld: 155 mg/dL — ABNORMAL HIGH (ref 70–99)
Phosphorus: 7.7 mg/dL — ABNORMAL HIGH (ref 2.5–4.6)
Potassium: 4.5 mmol/L (ref 3.5–5.1)
Sodium: 137 mmol/L (ref 135–145)
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
## 2019-12-10 NOTE — TOC Progression Note (Signed)
Transition of Care [facility]) - Progression Note    Patient Details  Name: Zachary Young MRN: [id number] Date of Birth: [birth year]  Transition of Care [facility]) CM/SW [place], [place] Phone Number: 12/10/2019, 12:28 PM  Clinical Narrative:    CSW received message from Erin Hearing, NP who states pt has shared that his brother will pay for a hotel for him until arrangements for an apartment have been made. Renal Navigator Jaclyn Shaggy also in conversation, per [place] pt deferred discussion of this to his sister [name]. CSW left a message for [name] on voicemail requesting call back. Renal Navigator Jaclyn Shaggy has tentatively sent referral out to [facility] for outpatient HD. Pt doing well per therapy notes ("He has progressed to the point where he could go home. Most PT goals have been met."), due to previous hx per [facility] will likely be unable to arrange [facility] services. We will be able to order any DME needed prior to d/c. Await call back.    Expected Discharge Plan: Skilled Nursing Facility Barriers to Discharge: Continued Medical Work up, No SNF bed, Homeless with medical needs, Waiting for outpatient dialysis  Expected Discharge Plan and Services Expected Discharge Plan: [place] In-house Referral: Clinical Social Work Discharge Planning Services: CM Consult Post Acute Care Choice: [place], Dialysis Living arrangements for the past 2 months: No permanent address   Readmission Risk Interventions Readmission Risk Prevention Plan 11/12/2019  Transportation Screening Complete  Medication Review Press photographer) Referral to Pharmacy  PCP or Specialist appointment within 3-5 days of discharge Not Complete  PCP/Specialist Appt Not Complete comments plan for SNF; no PCP  HRI or Home Care Consult Complete  SW Recovery Care/Counseling Consult Complete  Palliative Care Screening Complete  Skilled Nursing Facility Complete  Some recent data might be hidden

## 2019-12-10 NOTE — Progress Notes (Signed)
Renal Navigator received message from NP/A. Lissa Merlin that pt and family plan to pay for a motel in [place] until his apartment at [facility] is available. Navigator met with patient at HD bedside to discuss. He is in agreement, but stated that Navigator would have to confirm plans with his sister [name]. Navigator spoke with CSW/I. Chasse to request that she confirm patient's disposition plan. Renal Navigator submitted OP HD referral to [facility], as this is the closest clinic to [facility] in [place] and will hopefully mean that patient will be able to stay at the same clinic when he moves from motel to apartment. Renal Navigator discussed this with CSW.  At this time, [name] only has MWF seats available because they have COVID patients treating on TTS. I have requested a first shift in the event that patient's sister will be his transportation-I have been notified that she works third shift. Navigator will continue to follow closely.  Alphonzo Cruise, [place] Renal Navigator [phone]

## 2019-12-10 NOTE — Progress Notes (Signed)
**Zachary [name]** Zachary Zachary Young  Assessment/ Plan: Pt is a 59 y.o. yo male with history of CKD stage IV followed at [facility], DM, CHF, hypertension, right BKA now with new ESRD on dialysis.  #New ESRD started dialysis on 7/23.  He has AV fistula placed and catheter changed to [facility] on 7/30.  Currently undergoing TTS schedule dialysis.  Difficult disposition because of multiple social issues.  Renal navigator is following.  Clinically stable, plan for regular HD today.  # Anemia of CKD: Hemoglobin at goal.  Continue ESA, dose reduced.  # Secondary hyperparathyroidism: Increase sevelamer to 2 pills with meals, monitor phosphorus level.  PTH 22 in 10/2019.   # HTN/volume: Blood pressure acceptable.  Requiring midodrine for intradialytic hypotension.  Subjective: Seen and examined. Denies nausea vomiting chest pain shortness of breath.  No new event.  Objective Vital signs in last 24 hours: Vitals:   12/08/19 1610 12/08/19 2155 12/09/19 1805 12/10/19 0627  BP: (!) 149/65 (!) 142/60 (!) 110/52 132/69  Pulse: 76 79 79 82  Resp: 16 17 16 16   Temp: 98.7 F (37.1 C) 98.2 F (36.8 C) 98 F (36.7 C) 98 F (36.7 C)  TempSrc: Oral Oral Oral Oral  SpO2: 100% 100% 96% 97%  Weight:      Height:       Weight change:   Intake/Output Summary (Last 24 hours) at 12/10/2019 1058 Last data filed at 12/10/2019 0706 Gross per 24 hour  Intake 720 ml  Output 851 ml  Net -131 ml       Labs: Basic Metabolic Panel: Recent Labs  Lab 12/05/19 0722 12/08/19 0853  NA 136 135  K 3.8 3.9  CL 96* 93*  CO2 24 27  GLUCOSE 303* 134*  BUN 57* 81*  CREATININE 5.77* 6.89*  CALCIUM 9.2 9.3  PHOS 5.9* 8.0*   Liver Function Tests: Recent Labs  Lab 12/05/19 0722 12/08/19 0853  ALBUMIN 3.1* 3.2*   No results for input(s): LIPASE, AMYLASE in the last 168 hours. No results for input(s): AMMONIA in the last 168 hours. CBC: Recent Labs  Lab 12/05/19 0722 12/07/19 0835 12/08/19 0853   WBC 7.8 8.7 8.1  HGB 11.1* 11.6* 10.7*  HCT 37.0* 38.3* 35.7*  MCV 88.5 85.3 86.9  PLT 344 309 340   Cardiac Enzymes: No results for input(s): CKTOTAL, CKMB, CKMBINDEX, TROPONINI in the last 168 hours. CBG: Recent Labs  Lab 12/09/19 0630 12/09/19 1113 12/09/19 1616 12/09/19 2102 12/10/19 0626  GLUCAP 142* 192* 239* 201* 123*    Iron Studies: No results for input(s): IRON, TIBC, TRANSFERRIN, FERRITIN in the last 72 hours. Studies/Results: No results found.  Medications: Infusions: . sodium chloride 10 mL/hr at 11/13/19 0949    Scheduled Medications: . aspirin EC  81 mg Oral Daily  . Chlorhexidine Gluconate Cloth  6 each Topical Q0600  . Chlorhexidine Gluconate Cloth  6 each Topical Q0600  . collagenase   Topical Daily  . darbepoetin (ARANESP) injection - DIALYSIS  100 mcg Intravenous Q Tue-HD  . heparin injection (subcutaneous)  5,000 Units Subcutaneous Q8H  . hydrocerin   Topical BID  . insulin aspart  0-15 Units Subcutaneous TID WC  . insulin aspart  0-5 Units Subcutaneous QHS  . insulin aspart  10 Units Subcutaneous TID WC  . insulin glargine  35 Units Subcutaneous QHS  . lidocaine  1 patch Transdermal Q24H  . midodrine  10 mg Oral TID WC  . multivitamin with minerals  1 tablet Oral  Daily  . pregabalin  50 mg Oral BID  . prochlorperazine  10 mg Intravenous QHS  . Ensure Max Protein  11 oz Oral BID  . sevelamer carbonate  1,600 mg Oral TID WC    have reviewed scheduled and prn medications.  Physical Exam: General:NAD, comfortable, lying on bed comfortable Heart:RRR, s1s2 nl Lungs:clear b/l, no crackle Abdomen:soft, Non-tender, non-distended Extremities: Right BKA. Dialysis Access: [facility] and left upper extremity AV fistula has good thrill and bruit.  Zachary Zachary Young Zachary Zachary Young 12/10/2019,10:58 AM  LOS: 54 days  Pager: [phone]

## 2019-12-11 LAB — CBC
HCT: 41.6 % (ref 39.0–52.0)
Hemoglobin: 12.7 g/dL — ABNORMAL LOW (ref 13.0–17.0)
MCH: 26.4 pg (ref 26.0–34.0)
MCHC: 30.5 g/dL (ref 30.0–36.0)
MCV: 86.5 fL (ref 80.0–100.0)
Platelets: 289 10*3/uL (ref 150–400)
RBC: 4.81 MIL/uL (ref 4.22–5.81)
RDW: 17.4 % — ABNORMAL HIGH (ref 11.5–15.5)
WBC: 8.6 10*3/uL (ref 4.0–10.5)
nRBC: 0 % (ref 0.0–0.2)

## 2019-12-11 LAB — GLUCOSE, CAPILLARY
Glucose-Capillary: 187 mg/dL — ABNORMAL HIGH (ref 70–99)
Glucose-Capillary: 139 mg/dL — ABNORMAL HIGH (ref 70–99)
Glucose-Capillary: 382 mg/dL — ABNORMAL HIGH (ref 70–99)
Glucose-Capillary: 323 mg/dL — ABNORMAL HIGH (ref 70–99)
Glucose-Capillary: 173 mg/dL — ABNORMAL HIGH (ref 70–99)

## 2019-12-11 MED ORDER — HEPARIN SODIUM (PORCINE) 1000 UNIT/ML DIALYSIS: 1000.0000 [IU] | INTRAMUSCULAR | Status: DC | PRN

## 2019-12-11 MED ORDER — PENTAFLUOROPROP-TETRAFLUOROETH EX AERO: 1.0000 "application " | INHALATION_SPRAY | CUTANEOUS | Status: DC | PRN

## 2019-12-11 MED ORDER — LIDOCAINE 5 % EX PTCH: 1.0000 | MEDICATED_PATCH | CUTANEOUS | 0 refills | Status: DC

## 2019-12-11 MED ORDER — PREGABALIN 50 MG PO CAPS: 50.0000 mg | ORAL_CAPSULE | Freq: Two times a day (BID) | ORAL | 2 refills | Status: DC

## 2019-12-11 MED ORDER — SODIUM CHLORIDE 0.9 % IV SOLN: 100.0000 mL | INTRAVENOUS | Status: DC | PRN

## 2019-12-11 MED ORDER — DICLOFENAC SODIUM 1 % EX GEL: CUTANEOUS | 1 refills | Status: DC

## 2019-12-11 MED ORDER — MIDODRINE HCL 10 MG PO TABS
10.0000 mg | ORAL_TABLET | Freq: Three times a day (TID) | ORAL | 2 refills | Status: DC
Start: 2019-12-11 — End: 2020-06-28

## 2019-12-11 MED ORDER — ADULT MULTIVITAMIN W/MINERALS CH: 1.0000 | ORAL_TABLET | Freq: Every day | ORAL | 2 refills | Status: DC

## 2019-12-11 MED ORDER — OXYCODONE-ACETAMINOPHEN 5-325 MG PO TABS
1.0000 | ORAL_TABLET | Freq: Four times a day (QID) | ORAL | 0 refills | Status: DC | PRN
Start: 2019-12-11 — End: 2019-12-11

## 2019-12-11 MED ORDER — "PEN NEEDLES 5/16"" 31G X 8 MM MISC": 1.0000 | Freq: Every day | 2 refills | Status: DC

## 2019-12-11 MED ORDER — CHLORHEXIDINE GLUCONATE CLOTH 2 % EX PADS
6.0000 | MEDICATED_PAD | Freq: Every day | CUTANEOUS | Status: DC
  Administered 2019-12-12: 6 via TOPICAL

## 2019-12-11 MED ORDER — INSULIN GLARGINE 100 UNIT/ML ~~LOC~~ SOLN: 35.0000 [IU] | Freq: Every day | SUBCUTANEOUS | 11 refills | Status: AC

## 2019-12-11 MED ORDER — LIDOCAINE HCL (PF) 1 % IJ SOLN: 5.0000 mL | INTRAMUSCULAR | Status: DC | PRN

## 2019-12-11 MED ORDER — HEPARIN SODIUM (PORCINE) 1000 UNIT/ML DIALYSIS: 20.0000 [IU]/kg | INTRAMUSCULAR | Status: DC | PRN

## 2019-12-11 MED ORDER — ALTEPLASE 2 MG IJ SOLR: 2.0000 mg | Freq: Once | INTRAMUSCULAR | Status: DC | PRN

## 2019-12-11 MED ORDER — SEVELAMER CARBONATE 800 MG PO TABS
1600.0000 mg | ORAL_TABLET | Freq: Three times a day (TID) | ORAL | 2 refills | Status: DC
Start: 2019-12-11 — End: 2020-01-27

## 2019-12-11 MED ORDER — ERGOCALCIFEROL 1.25 MG (50000 UT) PO CAPS: 50000.0000 [IU] | ORAL_CAPSULE | ORAL | 1 refills | Status: AC

## 2019-12-11 MED ORDER — INSULIN ASPART 100 UNIT/ML ~~LOC~~ SOLN: 10.0000 [IU] | Freq: Three times a day (TID) | SUBCUTANEOUS | 11 refills | Status: DC

## 2019-12-11 MED ORDER — LIDOCAINE-PRILOCAINE 2.5-2.5 % EX CREA: 1.0000 "application " | TOPICAL_CREAM | CUTANEOUS | Status: DC | PRN

## 2019-12-11 MED ORDER — OXYCODONE-ACETAMINOPHEN 5-325 MG PO TABS
1.0000 | ORAL_TABLET | Freq: Four times a day (QID) | ORAL | 0 refills | Status: DC | PRN
Start: 2019-12-11 — End: 2020-01-27

## 2019-12-11 MED FILL — VIT D2 1.25 MG (50,000 UNIT: 1.25 MG | 28 days supply | Qty: 4 | Fill #0

## 2019-12-11 MED FILL — LANTUS SOLOSTAR 100 UNITS/M: 100 | 25 days supply | Qty: 9 | Fill #0

## 2019-12-11 MED FILL — MIDODRINE HCL 10 MG TABLET: 10 | 30 days supply | Qty: 90 | Fill #0

## 2019-12-11 MED FILL — PREGABALIN 50 MG CAPS: 50 | 30 days supply | Qty: 60 | Fill #0

## 2019-12-11 MED FILL — SEVELAMER CARBONATE 800MG: 800 | 15 days supply | Qty: 90 | Fill #0

## 2019-12-11 MED FILL — NOVOLOG FLEXPEN SYRINGE: 100 | 30 days supply | Qty: 9 | Fill #0

## 2019-12-11 MED FILL — PENTIPS 32G X 4 MM MISC: 32G X 4 MM | 30 days supply | Qty: 100 | Fill #0

## 2019-12-11 MED FILL — DICLOFENAC SODIUM 1% GEL: 1 | 7 days supply | Qty: 100 | Fill #0

## 2019-12-11 NOTE — Progress Notes (Signed)
Renal Navigator left message with [facility] to request update on referral submitted yesterday for OP HD treatment. Navigator will continue to follow closely.  Alphonzo Cruise, [place] Renal Navigator [phone]

## 2019-12-11 NOTE — Progress Notes (Signed)
Navigator received call from [employer] Coordinator/Keri who states she has spoken with [name]. They are still awaiting Medical Director approval for acceptance. Navigator requests an update before the end of the day. Coordinator agreed.  Alphonzo Cruise, [place] Renal Navigator [phone]

## 2019-12-11 NOTE — Progress Notes (Signed)
[facility] NEPHROLOGY PROGRESS NOTE  Assessment/ Plan: Pt is a 59 y.o. yo male with history of CKD stage IV followed at [facility], DM, CHF, hypertension, right BKA now with new ESRD on dialysis.  #New ESRD started dialysis on 7/23.  He has AV fistula placed and catheter changed to [facility] on 7/30.  Currently undergoing TTS schedule dialysis.  Difficult disposition because of multiple social issues.  Renal navigator is following, may be able to discharge soon to [place]. Clinically stable, status post HD yesterday with 3.5 L UF.  Plan for next HD tomorrow.  # Anemia of CKD: Hemoglobin at goal.  Continue ESA, dose reduced.  # Secondary hyperparathyroidism: Increase sevelamer to 2 pills with meals, monitor phosphorus level.  PTH 22 in 10/2019.   # HTN/volume: Blood pressure acceptable.  Requiring midodrine for intradialytic hypotension.  Subjective: Seen and examined. Denies nausea vomiting chest pain shortness of breath.  No new event. Objective Vital signs in last 24 hours: Vitals:   12/10/19 1522 12/10/19 1525 12/10/19 1702 12/11/19 0538  BP: (!) 140/58 (!) 150/78 132/79 (!) 134/121  Pulse: 93 90 87 77  Resp:  16 16 16   Temp:  98.1 F (36.7 C) 98.3 F (36.8 C)   TempSrc:  Oral Oral   SpO2: 97%  97% 98%  Weight: 120.8 kg     Height:       Weight change:   Intake/Output Summary (Last 24 hours) at 12/11/2019 1158 Last data filed at 12/10/2019 1522 Gross per 24 hour  Intake --  Output 3500 ml  Net -3500 ml       Labs: Basic Metabolic Panel: Recent Labs  Lab 12/05/19 0722 12/08/19 0853 12/10/19 1202  NA 136 135 137  K 3.8 3.9 4.5  CL 96* 93* 98  CO2 24 27 27   GLUCOSE 303* 134* 155*  BUN 57* 81* 73*  CREATININE 5.77* 6.89* 6.23*  CALCIUM 9.2 9.3 9.3  PHOS 5.9* 8.0* 7.7*   Liver Function Tests: Recent Labs  Lab 12/05/19 0722 12/08/19 0853 12/10/19 1202  ALBUMIN 3.1* 3.2* 3.2*   No results for input(s): LIPASE, AMYLASE in the last 168 hours. No  results for input(s): AMMONIA in the last 168 hours. CBC: Recent Labs  Lab 12/05/19 0722 12/05/19 0722 12/07/19 0835 12/07/19 0835 12/08/19 0853 12/10/19 1203 12/11/19 0644  WBC 7.8   < > 8.7   < > 8.1 9.1 8.6  HGB 11.1*   < > 11.6*   < > 10.7* 10.7* 12.7*  HCT 37.0*   < > 38.3*   < > 35.7* 36.3* 41.6  MCV 88.5  --  85.3  --  86.9 87.1 86.5  PLT 344   < > 309   < > 340 304 289   < > = values in this interval not displayed.   Cardiac Enzymes: No results for input(s): CKTOTAL, CKMB, CKMBINDEX, TROPONINI in the last 168 hours. CBG: Recent Labs  Lab 12/10/19 1112 12/10/19 1603 12/10/19 2115 12/11/19 0536 12/11/19 1105  GLUCAP 151* 187* 106* 139* 382*    Iron Studies: No results for input(s): IRON, TIBC, TRANSFERRIN, FERRITIN in the last 72 hours. Studies/Results: No results found.  Medications: Infusions: . sodium chloride 10 mL/hr at 11/13/19 0949  . sodium chloride    . sodium chloride      Scheduled Medications: . aspirin EC  81 mg Oral Daily  . Chlorhexidine Gluconate Cloth  6 each Topical Q0600  . Chlorhexidine Gluconate Cloth  6 each Topical  [id number]  . collagenase   Topical Daily  . darbepoetin (ARANESP) injection - DIALYSIS  100 mcg Intravenous Q Tue-HD  . heparin injection (subcutaneous)  5,000 Units Subcutaneous Q8H  . hydrocerin   Topical BID  . insulin aspart  0-15 Units Subcutaneous TID WC  . insulin aspart  0-5 Units Subcutaneous QHS  . insulin aspart  10 Units Subcutaneous TID WC  . insulin glargine  35 Units Subcutaneous QHS  . lidocaine  1 patch Transdermal Q24H  . midodrine  10 mg Oral TID WC  . multivitamin with minerals  1 tablet Oral Daily  . pregabalin  50 mg Oral BID  . prochlorperazine  10 mg Intravenous QHS  . Ensure Max Protein  11 oz Oral BID  . sevelamer carbonate  1,600 mg Oral TID WC    have reviewed scheduled and prn medications.  Physical Exam: General:NAD, lying on bed comfortable Heart:RRR, s1s2 nl Lungs:clear b/l, no  crackle Abdomen:soft, Non-tender, non-distended Extremities: Right BKA. Dialysis Access: [facility] and left upper extremity AV fistula has good thrill and bruit.  Talar Fraley Prasad Darienne Belleau 12/11/2019,11:58 AM  LOS: 55 days  Pager: [phone]

## 2019-12-11 NOTE — TOC Progression Note (Addendum)
Transition of Care [facility]) - Progression Note    Patient Details  Name: Zachary Young MRN: [id number] Date of Birth: 02-23-1961  Transition of Care [facility]) CM/SW [place], [place] Phone Number: 12/11/2019, 1:51 PM  Clinical Narrative:    4:23pm- This Probation officer has spoken with both [name] and [name], pts sisters. I have impressed upon them the need for pt family assistance in arranging hotel room as pt medically stable for d/c, he has an outpatient center in family preferred area [facility]). [name] and Webb Silversmith aware that we do not know pt budget or families preference for which hotel therefore we are asking them (along with their brother [name] per Brenda's report) to find a hotel near the dialysis center and provided them with the address. Our goal is to have hotel arranged for Monday for pt to dialyze outpatient on Wednesday. [name] aware that family would need to transport at least the first few times but then pt could transport using [employer] ($5 fee) or arrange transport through [employer] of [employer] (10 days lead time to qualify but free for pt.). This Probation officer has updated Dr. Loleta Books and Jaclyn Shaggy, LCSW and renal navigator.   2:19pm- Spoke with renal navigator Colleen who updated me that she was in the room with pt who called pt brother Butch. Butch had never committed to paying for hotel room stating he thought the hospital was arranging that and paying for it. He too has been unable to get in touch w/ [name]. Jaclyn Shaggy has encouraged him as have multiple team members that pt needs to call his family and have them arrange hotel near [place] dialysis center.   1:51pm- Called pt sister [name] again as she told RN she would be free after her appointment. That was at 11:55am this morning. Will continue to reattempt. Pt dialysis center is at [place], [place]. Pt family per pt was arranging hotel, will provide that address to pt sister when she  calls.   Expected Discharge Plan: Skilled Nursing Facility Barriers to Discharge: Continued Medical Work up, No SNF bed, Homeless with medical needs, Waiting for outpatient dialysis  Expected Discharge Plan and Services Expected Discharge Plan: [place] In-house Referral: Clinical Social Work Discharge Planning Services: CM Consult Post Acute Care Choice: [place], Dialysis Living arrangements for the past 2 months: No permanent address  Readmission Risk Interventions Readmission Risk Prevention Plan 11/12/2019  Transportation Screening Complete  Medication Review [employer]) Referral to Pharmacy  PCP or Specialist appointment within 3-5 days of discharge Not Complete  PCP/Specialist Appt Not Complete comments plan for SNF; no PCP  HRI or Home Care Consult Complete  SW Recovery Care/Counseling Consult Complete  Palliative Care Screening Complete  Skilled Nursing Facility Complete  Some recent data might be hidden

## 2019-12-11 NOTE — Care Management (Signed)
°  °  Durable Medical Equipment  (From admission, onward)         Start     Ordered   12/11/19 1135  For home use only DME 3 n 1  Once       Comments: Drop arm bedside commode   12/11/19 1134   12/11/19 1134  For home use only DME standard manual wheelchair with seat cushion  Once       Comments: Patient suffers from ambulatory dysfunction which impairs their ability to perform daily activities like walking in the home.  A walking aid will not resolve issue with performing activities of daily living. A wheelchair will allow patient to safely perform daily activities. Patient can safely propel the wheelchair in the home or has a caregiver who can provide assistance. Length of need 6 months Accessories: elevating leg rests, wheel locks, extensions and anti-tippers.  Seat and back cushions   HD   12/11/19 1133
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
## 2019-12-11 NOTE — TOC Progression Note (Addendum)
Transition of Care [facility]) - Progression Note    Patient Details  Name: Zachary Young MRN: [id number] Date of Birth: [birth year]  Transition of Care [facility]) CM/SW [place], [place] Phone Number: 12/11/2019, 11:17 AM  Clinical Narrative:    Additional call made to pt sister [name] to discuss disposition.  No answer, message left requesting a call back. Also discussed this with bedside RN to see if pt can attempt again this afternoon.   Expected Discharge Plan: Skilled Nursing Facility Barriers to Discharge: Continued Medical Work up, No SNF bed, Homeless with medical needs, Waiting for outpatient dialysis  Expected Discharge Plan and Services Expected Discharge Plan: [place] In-house Referral: Clinical Social Work Discharge Planning Services: CM Consult Post Acute Care Choice: [place], Dialysis Living arrangements for the past 2 months: No permanent address  Readmission Risk Interventions Readmission Risk Prevention Plan 11/12/2019  Transportation Screening Complete  Medication Review [employer]) Referral to Pharmacy  PCP or Specialist appointment within 3-5 days of discharge Not Complete  PCP/Specialist Appt Not Complete comments plan for SNF; no PCP  HRI or Home Care Consult Complete  SW Recovery Care/Counseling Consult Complete  Palliative Care Screening Complete  Skilled Nursing Facility Complete  Some recent data might be hidden

## 2019-12-11 NOTE — Care Management (Signed)
PT requested 18 inch wheel chair.  [employer] has 24 inch but no 18 inch.   NCM called [employer] and was told wheel chairs are in shortage right now. They do not have any. PT and NP aware and approved 24 inch.   Ordered drop arm commode and wheel chair with Zach with [employer].   [name] RN

## 2019-12-11 NOTE — Progress Notes (Signed)
Renal Navigator met with patient at bedside to provide OP HD clinic/schedule information to him verbally and in writing. Patient was understanding and able to teach back that he will have HD tomorrow (Saturday) and start new MWF schedule on Monday. His clinic is prepared to start him if he is discharged over the weekend. However, patient states he is extremely excited to "go home," but cannot tell Navigator where he is going at discharge. He states he cannot get in touch with his sister [name], and Navigator shared with him that staff have not been able to reach her about his discharge plan either. He called her numerous times while Navigator was in the room. Navigator asked him to call his brother, as it was passed along to Navigator by Primary NP that brother was involved in making motel arrangements at some point. Patient called his brother "[name]" who could not talk long because he was at work. When asked if he and his family have paid for a motel for patient, he replied that he must have misunderstood because he thought the hospital was setting this up for patient. He, too, states that he has not been able to reach his sister [name]. OP HD clinic information given to patient's brother and brother asked to please keep trying to reach [name] to ask her to call CSW at the hospital to continue to work on discharge planning, as patient is medically ready. Medical team updated.  Alphonzo Cruise,  Renal Navigator [phone]

## 2019-12-11 NOTE — Progress Notes (Signed)
Physical Therapy Treatment Patient Details Name: Zachary Young MRN: [id number] DOB: December 09, 1960 Today's Date: 12/11/2019    History of Present Illness Pt is a 59 y/o male s/p R transtibial amputation. PMH including but not limited to CHF, DM, HTN, L transmet amputation, cocaine abuse. Pt now s/p HD catheter placement and has started HD.     PT Comments    Pt continues to progress with all mobility. He demonstrated ability to manage w/c transfers and w/c parts safely. He is able to manage short in room ambulation with the walker without physical assist. He elevates the bed before coming to stand but I did make him demonstrate coming to stand from a lower surface which he could do with some effort. Pt typically wears a regular tennis shoe on his lt foot when he is at home which I recommended he use instead of the [place] which he doesn't feel as stable in. I asked if his sister could bring him his shoe at DC and he said she could. If the bathroom at the hotel/apt isn't wheelchair accessible he will amb into the bathroom to access the commode. Pt would not wear limb protector today. I encouraged him to wear it with mobility and explained it will protect residual limb so it will heal and he will be prosthetic candidate. This patient will always have some fall risk in any setting that he is in. Feel at this time we have minimized this risk and he can go to apartment.    Follow Up Recommendations  Supervision - Intermittent     Equipment Recommendations  Wheelchair (measurements PT);Wheelchair cushion (measurements PT);Rolling walker with 5" wheels;Other (comment) (18 x18" wheelchair, drop arm commode)    Recommendations for Other Services       Precautions / Restrictions Precautions Precautions: Fall Precaution Comments: R limb protector Other Brace: R limb protector - pt refused to use this date Restrictions Weight Bearing Restrictions: Yes RLE Weight Bearing: Non weight bearing     Mobility  Bed Mobility               General bed mobility comments: Pt sitting EOB  Transfers Overall transfer level: Needs assistance Equipment used: Rolling walker (2 wheeled) Transfers: Sit to/from Stand Sit to Stand: Supervision;From elevated surface        Lateral/Scoot Transfers: Modified independent (Device/Increase time) General transfer comment: Pt able to perform bed to wheelchair to bed lateral scoot transfer including wheelchair management without assist. Supervision for sit to stand with walker.  Ambulation/Gait Ambulation/Gait assistance: Supervision Gait Distance (Feet): 30 Feet Assistive device: Rolling walker (2 wheeled) Gait Pattern/deviations: Step-to pattern (hop to) Gait velocity: decr Gait velocity interpretation: <1.31 ft/sec, indicative of household ambulator General Gait Details: supervision for Haematologist mobility: Yes Wheelchair propulsion: Both upper extremities;Left lower extremity Wheelchair parts: Independent Distance: 2000' + Wheelchair Assistance Details (indicate cue type and reason): Pt able to manage armrest, brakes and navigating inside and outside on level surfaces  Modified Rankin (Stroke Patients Only)       Balance Overall balance assessment: Needs assistance Sitting-balance support: No upper extremity supported;Feet supported Sitting balance-Leahy Scale: Normal     Standing balance support: Bilateral upper extremity supported;During functional activity Standing balance-Leahy Scale: Poor Standing balance comment: Pt needs UE support due to only one leg  Cognition Arousal/Alertness: Awake/alert Behavior During Therapy: WFL for tasks assessed/performed;Impulsive Overall Cognitive Status: No family/caregiver present to determine baseline cognitive functioning                                  General Comments: Pt cooperative with therapy      Exercises      General Comments        Pertinent Vitals/Pain Pain Assessment: No/denies pain    Home Living                      Prior Function            PT Goals (current goals can now be found in the care plan section) Acute Rehab PT Goals Patient Stated Goal: to go outside PT Goal Formulation: With patient Time For Goal Achievement: 12/25/19 Potential to Achieve Goals: Good Progress towards PT goals: Goals met and updated - see care plan    Frequency    Min 3X/week      PT Plan Discharge plan needs to be updated    Co-evaluation              AM-PAC PT "6 Clicks" Mobility   Outcome Measure  Help needed turning from your back to your side while in a flat bed without using bedrails?: None Help needed moving from lying on your back to sitting on the side of a flat bed without using bedrails?: None Help needed moving to and from a bed to a chair (including a wheelchair)?: None Help needed standing up from a chair using your arms (e.g., wheelchair or bedside chair)?: None Help needed to walk in hospital room?: None Help needed climbing 3-5 steps with a railing? : Total 6 Click Score: 21    End of Session   Activity Tolerance: Patient tolerated treatment well Patient left: with call bell/phone within reach;in bed Nurse Communication: Mobility status PT Visit Diagnosis: Other abnormalities of gait and mobility (R26.89)     Time: 5462-7035 PT Time Calculation (min) (ACUTE ONLY): 37 min  Charges:  $Gait Training: 8-22 mins $Wheel Chair Management: 8-22 mins                     [place] Pager [phone] Office [place] 12/11/2019, 11:04 AM

## 2019-12-11 NOTE — Progress Notes (Signed)
Renal Navigator received call back from [facility] who states patient has been accepted at [facility] in [place], [address], on a MWF schedule with a seat time of 11:20am. He can start on Monday, 12/14/19 if discharged prior to then. Renal Navigator notified Nephrologist/Dr. Carolin Sicks of acceptance and to switch to MWF OP HD schedule. Navigator notified Primary team. Navigator will meet with patient to inform.  Alphonzo Cruise, [place] Renal Navigator [phone]

## 2019-12-12 LAB — RENAL FUNCTION PANEL
Albumin: 3.3 g/dL — ABNORMAL LOW (ref 3.5–5.0)
Anion gap: 19 — ABNORMAL HIGH (ref 5–15)
BUN: 64 mg/dL — ABNORMAL HIGH (ref 6–20)
CO2: 25 mmol/L (ref 22–32)
Calcium: 9.2 mg/dL (ref 8.9–10.3)
Chloride: 91 mmol/L — ABNORMAL LOW (ref 98–111)
Creatinine, Ser: 7.16 mg/dL — ABNORMAL HIGH (ref 0.61–1.24)
GFR calc Af Amer: 9 mL/min — ABNORMAL LOW (ref >60)
GFR calc non Af Amer: 8 mL/min — ABNORMAL LOW (ref >60)
Glucose, Bld: 161 mg/dL — ABNORMAL HIGH (ref 70–99)
Phosphorus: 8.3 mg/dL — ABNORMAL HIGH (ref 2.5–4.6)
Potassium: 4 mmol/L (ref 3.5–5.1)
Sodium: 135 mmol/L (ref 135–145)

## 2019-12-12 LAB — CBC
HCT: 37.5 % — ABNORMAL LOW (ref 39.0–52.0)
Hemoglobin: 11.1 g/dL — ABNORMAL LOW (ref 13.0–17.0)
MCH: 25.6 pg — ABNORMAL LOW (ref 26.0–34.0)
MCHC: 29.6 g/dL — ABNORMAL LOW (ref 30.0–36.0)
MCV: 86.6 fL (ref 80.0–100.0)
Platelets: 295 10*3/uL (ref 150–400)
RBC: 4.33 MIL/uL (ref 4.22–5.81)
RDW: 16.9 % — ABNORMAL HIGH (ref 11.5–15.5)
WBC: 7.9 10*3/uL (ref 4.0–10.5)
nRBC: 0 % (ref 0.0–0.2)

## 2019-12-12 LAB — GLUCOSE, CAPILLARY: Glucose-Capillary: 139 mg/dL — ABNORMAL HIGH (ref 70–99)

## 2019-12-12 MED ORDER — HEPARIN SODIUM (PORCINE) 1000 UNIT/ML DIALYSIS: 20.0000 [IU]/kg | INTRAMUSCULAR | Status: DC | PRN

## 2019-12-12 MED ORDER — HEPARIN SODIUM (PORCINE) 1000 UNIT/ML DIALYSIS: 1000.0000 [IU] | INTRAMUSCULAR | Status: DC | PRN

## 2019-12-12 MED ORDER — ALTEPLASE 2 MG IJ SOLR: 2.0000 mg | Freq: Once | INTRAMUSCULAR | Status: DC | PRN

## 2019-12-12 MED ORDER — HEPARIN SODIUM (PORCINE) 1000 UNIT/ML IJ SOLN
INTRAMUSCULAR | Status: AC
  Filled 2019-12-12: qty 4

## 2019-12-12 MED ORDER — SODIUM CHLORIDE 0.9 % IV SOLN: 100.0000 mL | INTRAVENOUS | Status: DC | PRN

## 2019-12-12 MED ORDER — LIDOCAINE HCL (PF) 1 % IJ SOLN: 5.0000 mL | INTRAMUSCULAR | Status: DC | PRN

## 2019-12-12 MED ORDER — LIDOCAINE-PRILOCAINE 2.5-2.5 % EX CREA: 1.0000 "application " | TOPICAL_CREAM | CUTANEOUS | Status: DC | PRN

## 2019-12-12 MED ORDER — PENTAFLUOROPROP-TETRAFLUOROETH EX AERO: 1.0000 "application " | INHALATION_SPRAY | CUTANEOUS | Status: DC | PRN

## 2019-12-12 NOTE — TOC Progression Note (Addendum)
Transition of Care [facility]) - Progression Note    Patient Details  Name: Zachary Young MRN: [id number] Date of Birth: July 12, 1960  Transition of Care [facility]) CM/SW [place], LCSW Phone Number: 12/12/2019, 12:22 PM  Clinical Narrative:   CSW received message from RN that patient was saying he would pay for a motel room. CSW met with patient and noted patient denied this. Patient called multiple numbers for family contacts and spoke with an individual named Iona Beard. Iona Beard and patient verbalized understanding of discharge orders. CSW provided phone number to family contact who stated they will follow-up. CSW will continue to follow for discharge supports.   3:14PM: CSW followed up with patient and has not successfully reached any family. Patient reported he could be discharged to [place], [place] informed patient he has no guarantee his brother can have him there. CSW informed patient this would be his decision. CSW noted patient affirmed he would be okay to go there and reported he would alternatively get a motel in [place]. CSW reviewed the ride waiver with the patient and discussed self-determination to be transferred to this address with no guarantee he could be there. CSW noted patient affirmed understanding and signed ride waiver. CSW coordinated with RN ride plans and will continue to follow for discharge supports.     Expected Discharge Plan: Skilled Nursing Facility Barriers to Discharge: Continued Medical Work up, No SNF bed, Homeless with medical needs, Waiting for outpatient dialysis  Expected Discharge Plan and Services Expected Discharge Plan: [place] In-house Referral: Clinical Social Work Discharge Planning Services: CM Consult Post Acute Care Choice: [place], Dialysis Living arrangements for the past 2 months: No permanent address                                       Social Determinants of  Health (SDOH) Interventions    Readmission Risk Interventions Readmission Risk Prevention Plan 11/12/2019  Transportation Screening Complete  Medication Review Press photographer) Referral to Pharmacy  PCP or Specialist appointment within 3-5 days of discharge Not Complete  PCP/Specialist Appt Not Complete comments plan for SNF; no PCP  HRI or Home Care Consult Complete  SW Recovery Care/Counseling Consult Complete  Palliative Care Screening Complete  Skilled Nursing Facility Complete  Some recent data might be hidden

## 2019-12-12 NOTE — Procedures (Signed)
Patient was seen on dialysis and the procedure was supervised.  BFR 400  Via TDC  BP is 117/69.   Patient appears to be tolerating treatment well.  Zachary Young Tanna Furry 12/12/2019

## 2019-12-12 NOTE — Progress Notes (Signed)
Pt given discharge summary and discharge via uber with all belongings and wheelchair. Asked him if he wanted his 3-1 commode and pt stated he already had one at "home".

## 2019-12-12 NOTE — Progress Notes (Signed)
  PROGRESS NOTE    Deyon Chizek   BZX:[id number]  DOB: 1960-05-14  DOA: 10/17/2019     SUBJECTIVE: Michela Pitcher he has money but needs to buy a new car    OBJECTIVE: BP (!) 129/99 (BP Location: Right Arm)   Pulse 97   Temp 98.2 F (36.8 C) (Oral)   Resp 16   Ht 6\' 3"  (1.905 m)   Wt 120.4 kg   SpO2 95%   BMI 33.18 kg/m    In bed, NAD rrr Almost pleasant today    ASSESSMENT AND PLAN:  Right foot osteomyelitis Sp RIGHT BKA 7.7 by Dr. 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

## 2019-12-14 LAB — GLUCOSE, CAPILLARY: Glucose-Capillary: 158 mg/dL — ABNORMAL HIGH (ref 70–99)

## 2019-12-22 ENCOUNTER — Encounter (HOSPITAL_COMMUNITY): Payer: Medicaid Other

## 2020-01-01 ENCOUNTER — Other Ambulatory Visit: Payer: Self-pay

## 2020-01-01 ENCOUNTER — Inpatient Hospital Stay (HOSPITAL_COMMUNITY): Admission: RE | Admit: 2020-01-01 | Payer: Medicaid Other | Source: Ambulatory Visit

## 2020-01-01 DIAGNOSIS — N184 Chronic kidney disease, stage 4 (severe): Secondary | ICD-10-CM

## 2020-01-06 ENCOUNTER — Other Ambulatory Visit: Payer: Self-pay

## 2020-01-06 ENCOUNTER — Ambulatory Visit (INDEPENDENT_AMBULATORY_CARE_PROVIDER_SITE_OTHER): Payer: Self-pay | Admitting: Physician Assistant

## 2020-01-06 ENCOUNTER — Ambulatory Visit (HOSPITAL_COMMUNITY)
Admission: RE | Admit: 2020-01-06 | Discharge: 2020-01-06 | Disposition: A | Discharge status: Home / Self Care | Payer: Medicaid Other | Source: Ambulatory Visit | Attending: Vascular Surgery | Admitting: Vascular Surgery

## 2020-01-06 VITALS — BP 175/88 | HR 78 | Temp 98.3°F | Resp 20 | Ht 75.0 in

## 2020-01-06 DIAGNOSIS — Z992 Dependence on renal dialysis: Secondary | ICD-10-CM

## 2020-01-06 DIAGNOSIS — N184 Chronic kidney disease, stage 4 (severe): Secondary | ICD-10-CM | POA: Insufficient documentation

## 2020-01-06 DIAGNOSIS — N186 End stage renal disease: Secondary | ICD-10-CM

## 2020-01-06 NOTE — H&P (View-Only) (Signed)
Established Dialysis Access   History of Present Illness   Zachary Young is a 59 y.o. (07-08-1960) male who presents for re-evaluation of permanent access.  He is s/p L 1st stage basilic vein transposition by Dr. Donnetta Hutching 11/13/19.  He has some numbness and tingling in his L hand however this is improving.  He is dialyzing via R IJ TDC on a TThS schedule.  He also recently had a R BKA by Dr. Sharol Given and is continuing to receive wound care.  The patient's PMH, PSH, SH, and FamHx were reviewed and are unchanged from prior visit.  Current Outpatient Medications  Medication Sig Dispense Refill  . albuterol (VENTOLIN HFA) 108 (90 Base) MCG/ACT inhaler Inhale 1 puff into the lungs every 4 (four) hours as needed for wheezing or shortness of breath. 6.7 g 0  . aspirin EC 81 MG EC tablet Take 1 tablet (81 mg total) by mouth daily. 30 tablet 0  . diclofenac Sodium (VOLTAREN) 1 % GEL Apply 1 inch to BKA stump to help with pain 4 g 1  . ergocalciferol (VITAMIN D2) 1.25 MG (50000 UT) capsule Take 1 capsule (50,000 Units total) by mouth every Friday. 30 capsule 1  . insulin aspart (NOVOLOG) 100 UNIT/ML injection Inject 10 Units into the skin 3 (three) times daily with meals. 10 mL 11  . insulin glargine (LANTUS) 100 UNIT/ML injection Inject 0.35 mLs (35 Units total) into the skin at bedtime. 10 mL 11  . Insulin Pen Needle (PEN NEEDLES 31GX5/16") 31G X 8 MM MISC 1 each by Other route daily. 90 each 2  . lidocaine (LIDODERM) 5 % Place 1 patch onto the skin daily. Remove & Discard patch within 12 hours or as directed by MD 30 patch 0  . midodrine (PROAMATINE) 10 MG tablet Take 1 tablet (10 mg total) by mouth 3 (three) times daily with meals. 180 tablet 2  . Multiple Vitamin (MULTIVITAMIN WITH MINERALS) TABS tablet Take 1 tablet by mouth daily. 30 tablet 2  . oxyCODONE-acetaminophen (PERCOCET/ROXICET) 5-325 MG tablet Take 1 tablet by mouth every 6 (six) hours as needed for severe pain. 30 tablet 0  . pregabalin  (LYRICA) 50 MG capsule Take 1 capsule (50 mg total) by mouth 2 (two) times daily. 60 capsule 2  . sevelamer carbonate (RENVELA) 800 MG tablet Take 2 tablets (1,600 mg total) by mouth 3 (three) times daily with meals. 90 tablet 2   No current facility-administered medications for this visit.    REVIEW OF SYSTEMS (negative unless checked):   Cardiac:  []  Chest pain or chest pressure? []  Shortness of breath upon activity? []  Shortness of breath when lying flat? []  Irregular heart rhythm?  Vascular:  []  Pain in calf, thigh, or hip brought on by walking? []  Pain in feet at night that wakes you up from your sleep? []  Blood clot in your veins? []  Leg swelling?  Pulmonary:  []  Oxygen at home? []  Productive cough? []  Wheezing?  Neurologic:  []  Sudden weakness in arms or legs? []  Sudden numbness in arms or legs? []  Sudden onset of difficult speaking or slurred speech? []  Temporary loss of vision in one eye? []  Problems with dizziness?  Gastrointestinal:  []  Blood in stool? []  Vomited blood?  Genitourinary:  []  Burning when urinating? []  Blood in urine?  Psychiatric:  []  Major depression  Hematologic:  []  Bleeding problems? []  Problems with blood clotting?  Dermatologic:  []  Rashes or ulcers?  Constitutional:  []  Fever or chills?  Ear/Nose/Throat:  []  Change in hearing? []  Nose bleeds? []  Sore throat?  Musculoskeletal:  []  Back pain? []  Joint pain? []  Muscle pain?   Physical Examination   Vitals:   01/06/20 1108  BP: (!) 175/88  Pulse: 78  Resp: 20  Temp: 98.3 F (36.8 C)  TempSrc: Temporal  SpO2: 96%  Height: 6\' 3"  (1.905 m)   Body mass index is 33.18 kg/m.  General:  WDWN in NAD; vital signs documented above Gait: Not observed HENT: WNL, normocephalic Pulmonary: normal non-labored breathing , without Rales, rhonchi,  wheezing Cardiac: regular HR Abdomen: soft, NT, no masses Skin: without rashes Vascular Exam/Pulses:  Right Left  Radial  2+ (normal) 2+ (normal)   Extremities: L arm incision well healed; palpable L radial pulse; palpable thrill through fistula in upper arm Musculoskeletal: no muscle wasting or atrophy  Neurologic: A&O X 3;  No focal weakness or paresthesias are detected Psychiatric:  The pt has Normal affect.   Non-invasive Vascular Imaging   left Arm Access Duplex :   Diameters:  4.5-74mm  >11mm in depth    Medical Decision Making   Zachary Young is a 59 y.o. male who presents with ESRD requiring hemodialysis.    Patent L brachiobasilic fistula with palpable thrill  Based on duplex and physical exam findings, fistula is mature enough to proceed with 2nd stage of transposition  This will be scheduled for a M,W, or F with Dr. Donnetta Hutching in the next week or so Risk, benefits, and alternatives to access surgery were discussed.   The patient is aware the risks include but are not limited to: bleeding, infection, steal syndrome, nerve damage, thrombosis, failure to mature, and need for additional procedures.   The patient agrees to proceed with the procedure.   Zachary Ligas PA-C [facility] [place] Office: [phone]  Clinic MD: Dr. Oneida Alar

## 2020-01-06 NOTE — Progress Notes (Signed)
Established Dialysis Access   History of Present Illness   Zachary Young is a 59 y.o. (05-01-1960) male who presents for re-evaluation of permanent access.  He is s/p L 1st stage basilic vein transposition by Dr. Donnetta Hutching 11/13/19.  He has some numbness and tingling in his L hand however this is improving.  He is dialyzing via R IJ TDC on a TThS schedule.  He also recently had a R BKA by Dr. Sharol Given and is continuing to receive wound care.  The patient's PMH, PSH, SH, and FamHx were reviewed and are unchanged from prior visit.  Current Outpatient Medications  Medication Sig Dispense Refill  . albuterol (VENTOLIN HFA) 108 (90 Base) MCG/ACT inhaler Inhale 1 puff into the lungs every 4 (four) hours as needed for wheezing or shortness of breath. 6.7 g 0  . aspirin EC 81 MG EC tablet Take 1 tablet (81 mg total) by mouth daily. 30 tablet 0  . diclofenac Sodium (VOLTAREN) 1 % GEL Apply 1 inch to BKA stump to help with pain 4 g 1  . ergocalciferol (VITAMIN D2) 1.25 MG (50000 UT) capsule Take 1 capsule (50,000 Units total) by mouth every Friday. 30 capsule 1  . insulin aspart (NOVOLOG) 100 UNIT/ML injection Inject 10 Units into the skin 3 (three) times daily with meals. 10 mL 11  . insulin glargine (LANTUS) 100 UNIT/ML injection Inject 0.35 mLs (35 Units total) into the skin at bedtime. 10 mL 11  . Insulin Pen Needle (PEN NEEDLES 31GX5/16") 31G X 8 MM MISC 1 each by Other route daily. 90 each 2  . lidocaine (LIDODERM) 5 % Place 1 patch onto the skin daily. Remove & Discard patch within 12 hours or as directed by MD 30 patch 0  . midodrine (PROAMATINE) 10 MG tablet Take 1 tablet (10 mg total) by mouth 3 (three) times daily with meals. 180 tablet 2  . Multiple Vitamin (MULTIVITAMIN WITH MINERALS) TABS tablet Take 1 tablet by mouth daily. 30 tablet 2  . oxyCODONE-acetaminophen (PERCOCET/ROXICET) 5-325 MG tablet Take 1 tablet by mouth every 6 (six) hours as needed for severe pain. 30 tablet 0  . pregabalin  (LYRICA) 50 MG capsule Take 1 capsule (50 mg total) by mouth 2 (two) times daily. 60 capsule 2  . sevelamer carbonate (RENVELA) 800 MG tablet Take 2 tablets (1,600 mg total) by mouth 3 (three) times daily with meals. 90 tablet 2   No current facility-administered medications for this visit.    REVIEW OF SYSTEMS (negative unless checked):   Cardiac:  []  Chest pain or chest pressure? []  Shortness of breath upon activity? []  Shortness of breath when lying flat? []  Irregular heart rhythm?  Vascular:  []  Pain in calf, thigh, or hip brought on by walking? []  Pain in feet at night that wakes you up from your sleep? []  Blood clot in your veins? []  Leg swelling?  Pulmonary:  []  Oxygen at home? []  Productive cough? []  Wheezing?  Neurologic:  []  Sudden weakness in arms or legs? []  Sudden numbness in arms or legs? []  Sudden onset of difficult speaking or slurred speech? []  Temporary loss of vision in one eye? []  Problems with dizziness?  Gastrointestinal:  []  Blood in stool? []  Vomited blood?  Genitourinary:  []  Burning when urinating? []  Blood in urine?  Psychiatric:  []  Major depression  Hematologic:  []  Bleeding problems? []  Problems with blood clotting?  Dermatologic:  []  Rashes or ulcers?  Constitutional:  []  Fever or chills?  Ear/Nose/Throat:  []  Change in hearing? []  Nose bleeds? []  Sore throat?  Musculoskeletal:  []  Back pain? []  Joint pain? []  Muscle pain?   Physical Examination   Vitals:   01/06/20 1108  BP: (!) 175/88  Pulse: 78  Resp: 20  Temp: 98.3 F (36.8 C)  TempSrc: Temporal  SpO2: 96%  Height: 6\' 3"  (1.905 m)   Body mass index is 33.18 kg/m.  General:  WDWN in NAD; vital signs documented above Gait: Not observed HENT: WNL, normocephalic Pulmonary: normal non-labored breathing , without Rales, rhonchi,  wheezing Cardiac: regular HR Abdomen: soft, NT, no masses Skin: without rashes Vascular Exam/Pulses:  Right Left  Radial  2+ (normal) 2+ (normal)   Extremities: L arm incision well healed; palpable L radial pulse; palpable thrill through fistula in upper arm Musculoskeletal: no muscle wasting or atrophy  Neurologic: A&O X 3;  No focal weakness or paresthesias are detected Psychiatric:  The pt has Normal affect.   Non-invasive Vascular Imaging   left Arm Access Duplex :   Diameters:  4.5-73mm  >83mm in depth    Medical Decision Making   Sandro Burgo is a 59 y.o. male who presents with ESRD requiring hemodialysis.    Patent L brachiobasilic fistula with palpable thrill  Based on duplex and physical exam findings, fistula is mature enough to proceed with 2nd stage of transposition  This will be scheduled for a M,W, or F with Dr. Donnetta Hutching in the next week or so Risk, benefits, and alternatives to access surgery were discussed.   The patient is aware the risks include but are not limited to: bleeding, infection, steal syndrome, nerve damage, thrombosis, failure to mature, and need for additional procedures.   The patient agrees to proceed with the procedure.   Dagoberto Ligas PA-C [facility] [place] Office: [phone]  Clinic MD: Dr. Oneida Alar

## 2020-01-25 ENCOUNTER — Other Ambulatory Visit
Admission: RE | Admit: 2020-01-25 | Discharge: 2020-01-25 | Disposition: A | Discharge status: Home / Self Care | Payer: Medicaid Other | Source: Ambulatory Visit | Attending: Vascular Surgery | Admitting: Vascular Surgery

## 2020-01-25 ENCOUNTER — Other Ambulatory Visit: Payer: Self-pay

## 2020-01-25 DIAGNOSIS — Z01812 Encounter for preprocedural laboratory examination: Secondary | ICD-10-CM | POA: Diagnosis not present

## 2020-01-25 DIAGNOSIS — Z20822 Contact with and (suspected) exposure to covid-19: Secondary | ICD-10-CM | POA: Insufficient documentation

## 2020-01-25 LAB — SARS CORONAVIRUS 2 (TAT 6-24 HRS): SARS Coronavirus 2: NEGATIVE

## 2020-01-27 ENCOUNTER — Ambulatory Visit (HOSPITAL_COMMUNITY)
Admission: RE | Admit: 2020-01-27 | Discharge: 2020-01-27 | Disposition: A | Discharge status: Home / Self Care | Payer: Medicaid Other | Attending: Vascular Surgery | Admitting: Vascular Surgery

## 2020-01-27 ENCOUNTER — Ambulatory Visit (HOSPITAL_COMMUNITY): Payer: Medicaid Other | Admitting: Anesthesiology

## 2020-01-27 ENCOUNTER — Other Ambulatory Visit: Payer: Self-pay

## 2020-01-27 ENCOUNTER — Encounter (HOSPITAL_COMMUNITY): Payer: Self-pay | Admitting: Vascular Surgery

## 2020-01-27 ENCOUNTER — Encounter (HOSPITAL_COMMUNITY)
Admission: RE | Disposition: A | Discharge status: Home / Self Care | Payer: Self-pay | Source: Home / Self Care | Attending: Vascular Surgery

## 2020-01-27 DIAGNOSIS — Z89511 Acquired absence of right leg below knee: Secondary | ICD-10-CM | POA: Insufficient documentation

## 2020-01-27 DIAGNOSIS — E1122 Type 2 diabetes mellitus with diabetic chronic kidney disease: Secondary | ICD-10-CM | POA: Insufficient documentation

## 2020-01-27 DIAGNOSIS — F17200 Nicotine dependence, unspecified, uncomplicated: Secondary | ICD-10-CM | POA: Insufficient documentation

## 2020-01-27 DIAGNOSIS — Z7982 Long term (current) use of aspirin: Secondary | ICD-10-CM | POA: Insufficient documentation

## 2020-01-27 DIAGNOSIS — Z992 Dependence on renal dialysis: Secondary | ICD-10-CM | POA: Diagnosis not present

## 2020-01-27 DIAGNOSIS — Z7951 Long term (current) use of inhaled steroids: Secondary | ICD-10-CM | POA: Diagnosis not present

## 2020-01-27 DIAGNOSIS — N186 End stage renal disease: Secondary | ICD-10-CM | POA: Insufficient documentation

## 2020-01-27 DIAGNOSIS — Z791 Long term (current) use of non-steroidal anti-inflammatories (NSAID): Secondary | ICD-10-CM | POA: Diagnosis not present

## 2020-01-27 DIAGNOSIS — I132 Hypertensive heart and chronic kidney disease with heart failure and with stage 5 chronic kidney disease, or end stage renal disease: Secondary | ICD-10-CM | POA: Insufficient documentation

## 2020-01-27 DIAGNOSIS — Z79899 Other long term (current) drug therapy: Secondary | ICD-10-CM | POA: Diagnosis not present

## 2020-01-27 DIAGNOSIS — D631 Anemia in chronic kidney disease: Secondary | ICD-10-CM | POA: Diagnosis not present

## 2020-01-27 DIAGNOSIS — I509 Heart failure, unspecified: Secondary | ICD-10-CM | POA: Diagnosis not present

## 2020-01-27 DIAGNOSIS — Z794 Long term (current) use of insulin: Secondary | ICD-10-CM | POA: Insufficient documentation

## 2020-01-27 DIAGNOSIS — N185 Chronic kidney disease, stage 5: Secondary | ICD-10-CM

## 2020-01-27 HISTORY — PX: BASCILIC VEIN TRANSPOSITION: SHX5742

## 2020-01-27 LAB — GLUCOSE, CAPILLARY
Glucose-Capillary: 278 mg/dL — ABNORMAL HIGH (ref 70–99)
Glucose-Capillary: 229 mg/dL — ABNORMAL HIGH (ref 70–99)

## 2020-01-27 LAB — POCT I-STAT, CHEM 8
BUN: 51 mg/dL — ABNORMAL HIGH (ref 6–20)
Calcium, Ion: 1.35 mmol/L (ref 1.15–1.40)
Chloride: 108 mmol/L (ref 98–111)
Creatinine, Ser: 4.2 mg/dL — ABNORMAL HIGH (ref 0.61–1.24)
Glucose, Bld: 298 mg/dL — ABNORMAL HIGH (ref 70–99)
HCT: 30 % — ABNORMAL LOW (ref 39.0–52.0)
Hemoglobin: 10.2 g/dL — ABNORMAL LOW (ref 13.0–17.0)
Potassium: 4.6 mmol/L (ref 3.5–5.1)
Sodium: 138 mmol/L (ref 135–145)
TCO2: 22 mmol/L (ref 22–32)

## 2020-01-27 SURGERY — TRANSPOSITION, VEIN, BASILIC
Anesthesia: General | Site: Arm Upper | Laterality: Left

## 2020-01-27 MED ORDER — SODIUM CHLORIDE 0.9 % IV SOLN
INTRAVENOUS | Status: DC | PRN
  Administered 2020-01-27: 11:00:00 500 mL

## 2020-01-27 MED ORDER — SODIUM CHLORIDE 0.9 % IV SOLN: INTRAVENOUS | Status: DC

## 2020-01-27 MED ORDER — SODIUM CHLORIDE 0.9 % IV SOLN
INTRAVENOUS | Status: AC
  Filled 2020-01-27: qty 1.2

## 2020-01-27 MED ORDER — CHLORHEXIDINE GLUCONATE 4 % EX LIQD: 60.0000 mL | Freq: Once | CUTANEOUS | Status: DC

## 2020-01-27 MED ORDER — INSULIN ASPART 100 UNIT/ML ~~LOC~~ SOLN
SUBCUTANEOUS | Status: AC
  Filled 2020-01-27: qty 1

## 2020-01-27 MED ORDER — FENTANYL CITRATE (PF) 100 MCG/2ML IJ SOLN
25.0000 ug | INTRAMUSCULAR | Status: DC | PRN
  Administered 2020-01-27: 25 ug via INTRAVENOUS

## 2020-01-27 MED ORDER — HYDROCODONE-ACETAMINOPHEN 5-325 MG PO TABS
1.0000 | ORAL_TABLET | Freq: Four times a day (QID) | ORAL | 0 refills | Status: DC | PRN
Start: 2020-01-27 — End: 2020-04-01

## 2020-01-27 MED ORDER — PHENYLEPHRINE HCL-NACL 10-0.9 MG/250ML-% IV SOLN
INTRAVENOUS | Status: DC | PRN
  Administered 2020-01-27: 25 ug/min via INTRAVENOUS

## 2020-01-27 MED ORDER — LIDOCAINE-EPINEPHRINE 0.5 %-1:200000 IJ SOLN
INTRAMUSCULAR | Status: AC
  Filled 2020-01-27: qty 1

## 2020-01-27 MED ORDER — ONDANSETRON HCL 4 MG/2ML IJ SOLN: 4.0000 mg | Freq: Once | INTRAMUSCULAR | Status: DC | PRN

## 2020-01-27 MED ORDER — CEFAZOLIN SODIUM-DEXTROSE 2-4 GM/100ML-% IV SOLN
2.0000 g | INTRAVENOUS | Status: AC
  Administered 2020-01-27: 2 g via INTRAVENOUS
  Filled 2020-01-27: qty 100

## 2020-01-27 MED ORDER — CHLORHEXIDINE GLUCONATE 0.12 % MT SOLN
OROMUCOSAL | Status: AC
  Administered 2020-01-27: 15 mL
  Filled 2020-01-27: qty 15

## 2020-01-27 MED ORDER — INSULIN ASPART 100 UNIT/ML ~~LOC~~ SOLN
3.0000 [IU] | Freq: Once | SUBCUTANEOUS | Status: AC
  Administered 2020-01-27: 3 [IU] via SUBCUTANEOUS

## 2020-01-27 MED ORDER — 0.9 % SODIUM CHLORIDE (POUR BTL) OPTIME
TOPICAL | Status: DC | PRN
  Administered 2020-01-27: 1000 mL

## 2020-01-27 MED ORDER — FENTANYL CITRATE (PF) 100 MCG/2ML IJ SOLN
INTRAMUSCULAR | Status: AC
  Filled 2020-01-27: qty 2

## 2020-01-27 MED ORDER — FENTANYL CITRATE (PF) 250 MCG/5ML IJ SOLN
INTRAMUSCULAR | Status: AC
Start: 2020-01-27 — End: ?
  Filled 2020-01-27: qty 5

## 2020-01-27 MED ORDER — SODIUM CHLORIDE 0.9 % IV SOLN: INTRAVENOUS | Status: DC | PRN

## 2020-01-27 MED ORDER — PROPOFOL 10 MG/ML IV BOLUS
INTRAVENOUS | Status: AC
  Filled 2020-01-27: qty 40

## 2020-01-27 MED ORDER — PROPOFOL 10 MG/ML IV BOLUS
INTRAVENOUS | Status: DC | PRN
  Administered 2020-01-27: 150 mg via INTRAVENOUS

## 2020-01-27 MED ORDER — FENTANYL CITRATE (PF) 100 MCG/2ML IJ SOLN
INTRAMUSCULAR | Status: DC | PRN
  Administered 2020-01-27 (×4): 25 ug via INTRAVENOUS

## 2020-01-27 SURGICAL SUPPLY — 37 items
ARMBAND PINK RESTRICT EXTREMIT (MISCELLANEOUS) ×3 IMPLANT
CANISTER SUCT 3000ML PPV (MISCELLANEOUS) ×3 IMPLANT
CANNULA VESSEL 3MM 2 BLNT TIP (CANNULA) ×3 IMPLANT
CLIP LIGATING EXTRA MED SLVR (CLIP) ×3 IMPLANT
CLIP LIGATING EXTRA SM BLUE (MISCELLANEOUS) ×3 IMPLANT
COVER PROBE W GEL 5X96 (DRAPES) ×3 IMPLANT
COVER WAND RF STERILE (DRAPES) ×3 IMPLANT
DECANTER SPIKE VIAL GLASS SM (MISCELLANEOUS) ×3 IMPLANT
DERMABOND ADVANCED (GAUZE/BANDAGES/DRESSINGS) ×2
DERMABOND ADVANCED .7 DNX12 (GAUZE/BANDAGES/DRESSINGS) ×1 IMPLANT
ELECT REM PT RETURN 9FT ADLT (ELECTROSURGICAL) ×3
ELECTRODE REM PT RTRN 9FT ADLT (ELECTROSURGICAL) ×1 IMPLANT
GLOVE BIOGEL PI IND STRL 7.0 (GLOVE) ×1 IMPLANT
GLOVE BIOGEL PI INDICATOR 7.0 (GLOVE) ×2
GLOVE ECLIPSE 7.5 STRL STRAW (GLOVE) ×3 IMPLANT
GLOVE SS BIOGEL STRL SZ 7.5 (GLOVE) ×1 IMPLANT
GLOVE SUPERSENSE BIOGEL SZ 7.5 (GLOVE) ×2
GLOVE SURG SS PI 6.5 STRL IVOR (GLOVE) ×3 IMPLANT
GOWN STRL REUS W/ TWL LRG LVL3 (GOWN DISPOSABLE) ×3 IMPLANT
GOWN STRL REUS W/ TWL XL LVL3 (GOWN DISPOSABLE) ×1 IMPLANT
GOWN STRL REUS W/TWL LRG LVL3 (GOWN DISPOSABLE) ×6
GOWN STRL REUS W/TWL XL LVL3 (GOWN DISPOSABLE) ×2
KIT BASIN OR (CUSTOM PROCEDURE TRAY) ×3 IMPLANT
KIT TURNOVER KIT B (KITS) ×3 IMPLANT
NS IRRIG 1000ML POUR BTL (IV SOLUTION) ×3 IMPLANT
PACK CV ACCESS (CUSTOM PROCEDURE TRAY) ×3 IMPLANT
PAD ARMBOARD 7.5X6 YLW CONV (MISCELLANEOUS) ×6 IMPLANT
SUT PROLENE 6 0 CC (SUTURE) ×6 IMPLANT
SUT SILK 2 0 SH (SUTURE) IMPLANT
SUT SILK 3 0 (SUTURE) ×2
SUT SILK 3-0 18XBRD TIE 12 (SUTURE) ×1 IMPLANT
SUT VIC AB 3-0 SH 27 (SUTURE) ×6
SUT VIC AB 3-0 SH 27X BRD (SUTURE) ×3 IMPLANT
SUT VIC AB 4-0 PS2 18 (SUTURE) ×9 IMPLANT
TOWEL GREEN STERILE (TOWEL DISPOSABLE) ×3 IMPLANT
UNDERPAD 30X36 HEAVY ABSORB (UNDERPADS AND DIAPERS) ×3 IMPLANT
WATER STERILE IRR 1000ML POUR (IV SOLUTION) ×3 IMPLANT

## 2020-01-27 NOTE — Interval H&P Note (Signed)
History and Physical Interval Note:  01/27/2020 10:37 AM  Zachary Young  has presented today for surgery, with the diagnosis of END STAGE RENAL DISEASE.  The various methods of treatment have been discussed with the patient and family. After consideration of risks, benefits and other options for treatment, the patient has consented to  Procedure(s): LEFT ARM 2ND STAGE Plain (Left) as a surgical intervention.  The patient's history has been reviewed, patient examined, no change in status, stable for surgery.  I have reviewed the patient's chart and labs.  Questions were answered to the patient's satisfaction.     Curt Jews

## 2020-01-27 NOTE — Discharge Instructions (Signed)
° °  [facility]  Discharge Instructions  AV Fistula or Graft Surgery for Dialysis Access  Please refer to the following instructions for your post-procedure care. Your surgeon or physician assistant will discuss any changes with you.  Activity  You may drive the day following your surgery, if you are comfortable and no longer taking prescription pain medication. Resume full activity as the soreness in your incision resolves.  Bathing/Showering  You may shower after you go home. Keep your incision dry for 48 hours. Do not soak in a bathtub, hot tub, or swim until the incision heals completely. You may not shower if you have a hemodialysis catheter.  Incision Care  Clean your incision with mild soap and water after 48 hours. Pat the area dry with a clean towel. You do not need a bandage unless otherwise instructed. Do not apply any ointments or creams to your incision. You may have skin glue on your incision. Do not peel it off. It will come off on its own in about one week. Your arm may swell a bit after surgery. To reduce swelling use pillows to elevate your arm so it is above your heart. Your doctor will tell you if you need to lightly wrap your arm with an ACE bandage.  Diet  Resume your normal diet. There are not special food restrictions following this procedure. In order to heal from your surgery, it is CRITICAL to get adequate nutrition. Your body requires vitamins, minerals, and protein. Vegetables are the best source of vitamins and minerals. Vegetables also provide the perfect balance of protein. Processed food has little nutritional value, so try to avoid this.  Medications  Resume taking all of your medications. If your incision is causing pain, you may take over-the counter pain relievers such as acetaminophen (Tylenol). If you were prescribed a stronger pain medication, please be aware these medications can cause nausea and constipation. Prevent  nausea by taking the medication with a snack or meal. Avoid constipation by drinking plenty of fluids and eating foods with high amount of fiber, such as fruits, vegetables, and grains.  Do not take Tylenol if you are taking prescription pain medications.  Follow up Your surgeon may want to see you in the office following your access surgery. If so, this will be arranged at the time of your surgery.  Please call us immediately for any of the following conditions:   Increased pain, redness, drainage (pus) from your incision site  Fever of 101 degrees or higher  Severe or worsening pain at your incision site  Hand pain or numbness.   Reduce your risk of vascular disease:   Stop smoking. If you would like help, call [facility] at 1-800-QUIT-NOW 701-696-6552) or [place] at [employer] your cholesterol  Maintain a desired weight  Control your diabetes  Keep your blood pressure down  Dialysis  It will take several weeks to several months for your new dialysis access to be ready for use. Your surgeon will determine when it is okay to use it. Your nephrologist will continue to direct your dialysis. You can continue to use your Permcath until your new access is ready for use.   01/27/2020 Zachary Young [id number] [birth year]  Surgeon(s): Early, Arvilla Meres, MD  Procedure(s): LEFT ARM 2ND STAGE BASILIC VEIN TRANSPOSITION  x Do not stick fistula for 8 weeks    If you have any questions, please call the office at [phone].

## 2020-01-27 NOTE — Anesthesia Procedure Notes (Signed)
Procedure Name: LMA Insertion Date/Time: 01/27/2020 11:15 AM Performed by: Eligha Bridegroom, CRNA Pre-anesthesia Checklist: Emergency Drugs available, Patient identified, Suction available, Patient being monitored and Timeout performed Patient Re-evaluated:Patient Re-evaluated prior to induction Oxygen Delivery Method: Circle system utilized Preoxygenation: Pre-oxygenation with 100% oxygen Induction Type: IV induction LMA: LMA with gastric port inserted LMA Size: 5.0 Number of attempts: 1 Placement Confirmation: positive ETCO2 and breath sounds checked- equal and bilateral Tube secured with: Tape Dental Injury: Teeth and Oropharynx as per pre-operative assessment

## 2020-01-27 NOTE — Op Note (Signed)
    OPERATIVE REPORT  DATE OF SURGERY: 01/27/2020  PATIENT: Zachary Young, 59 y.o. male MRN: [id number]  DOB: 29-Jul-1960  PRE-OPERATIVE DIAGNOSIS: End-stage renal disease  POST-OPERATIVE DIAGNOSIS:  Same  PROCEDURE: Left second stage basilic vein transposition fistula  SURGEON:  Curt Jews, M.D.  PHYSICIAN ASSISTANT: Rhyne  The assistant was needed for exposure and to expedite the case  ANESTHESIA: LMA  EBL: per anesthesia record  No intake/output data recorded.  BLOOD ADMINISTERED: none  DRAINS: none  SPECIMEN: none  COUNTS CORRECT:  YES  PATIENT DISPOSITION:  PACU - hemodynamically stable  PROCEDURE DETAILS: The patient was taken up and placed to position with area of the left arm prepped draped in sterile fashion.  SonoSite ultrasound was used to visualize the basilic vein which was of good caliber.  This was marked on the surface of the skin.  The prior antecubital incision was reopened and carried down to the brachial artery to basilic vein anastomosis.  There was dense scarring in this area.  This was freed up to expose the brachial artery proximal distal to the old anastomosis.  The basilic vein was exposed to the axilla through several skin bridges.  Tributary branches were ligated with 3-0 silk ties and divided.  The brachial artery was occluded proximally distally and the old anastomosis was taken down.  The vein was brought out through the tunnel.  The vein was gently dilated and was a very good size.  The vein was marked to reduce risk of twisting.  A tunnel was created in the subcutaneous tissue between the level of the antecubital wound and the axilla.  The vein was brought back through this tunnel.  The vein was cut to the appropriate length and spatulated and sewn end-to-side to the artery with a running 6-0 Prolene suture.  Clamps removed and excellent thrill was noted.  Wounds irrigated with saline.  Hemostasis change after cautery.  Wounds were closed with 3-0  Vicryl in the subcutaneous and subcuticular tissue.  Sterile dressing was applied the patient was transferred to the recovery room in stable condition   Rosetta Posner, M.D., [facility] 01/27/2020 1:56 PM

## 2020-01-27 NOTE — Transfer of Care (Signed)
Immediate Anesthesia Transfer of Care Note  Patient: Rakim Moone  Procedure(s) Performed: LEFT ARM 2ND STAGE BASCILIC VEIN TRANSPOSITION (Left Arm Upper)  Patient Location: PACU  Anesthesia Type:General  Level of Consciousness: drowsy  Airway & Oxygen Therapy: Patient Spontanous Breathing  Post-op Assessment: Report given to RN and Post -op Vital signs reviewed and stable  Post vital signs: Reviewed and stable  Last Vitals:  Vitals Value Taken Time  BP 128/67 01/27/20 1409  Temp    Pulse 81 01/27/20 1410  Resp 15 01/27/20 1410  SpO2 95 % 01/27/20 1410  Vitals shown include unvalidated device data.  Last Pain: There were no vitals filed for this visit.       Complications: No complications documented.

## 2020-01-27 NOTE — Anesthesia Preprocedure Evaluation (Addendum)
Anesthesia Evaluation  Patient identified by MRN, date of birth, ID band Patient awake    Reviewed: Allergy & Precautions, NPO status , Patient's Chart, lab work & pertinent test results  Airway Mallampati: III  TM Distance: >3 FB Neck ROM: Full    Dental  (+) Edentulous Upper, Missing   Pulmonary Current Smoker and Patient abstained from smoking.,    Pulmonary exam normal breath sounds clear to auscultation       Cardiovascular hypertension, +CHF  Normal cardiovascular exam Rhythm:Regular Rate:Normal  ECG: NSR, LAD   Neuro/Psych negative neurological ROS  negative psych ROS   GI/Hepatic negative GI ROS, Neg liver ROS,   Endo/Other  diabetes, Insulin Dependent  Renal/GU ESRF and DialysisRenal disease     Musculoskeletal negative musculoskeletal ROS (+)   Abdominal   Peds  Hematology  (+) anemia ,   Anesthesia Other Findings END STAGE RENAL DISEASE  Reproductive/Obstetrics                            Anesthesia Physical Anesthesia Plan  ASA: III  Anesthesia Plan: General   Post-op Pain Management:    Induction: Intravenous  PONV Risk Score and Plan: 1 and Ondansetron, Dexamethasone and Treatment may vary due to age or medical condition  Airway Management Planned: LMA  Additional Equipment:   Intra-op Plan:   Post-operative Plan: Extubation in OR  Informed Consent: I have reviewed the patients History and Physical, chart, labs and discussed the procedure including the risks, benefits and alternatives for the proposed anesthesia with the patient or authorized representative who has indicated his/her understanding and acceptance.       Plan Discussed with: CRNA  Anesthesia Plan Comments:         Anesthesia Quick Evaluation

## 2020-01-28 ENCOUNTER — Encounter (HOSPITAL_COMMUNITY): Payer: Self-pay | Admitting: Vascular Surgery

## 2020-01-28 NOTE — Anesthesia Postprocedure Evaluation (Signed)
Anesthesia Post Note  Patient: Trypp Heckmann  Procedure(s) Performed: LEFT ARM 2ND STAGE BASCILIC VEIN TRANSPOSITION (Left Arm Upper)     Patient location during evaluation: PACU Anesthesia Type: General Level of consciousness: awake and alert Pain management: pain level controlled Vital Signs Assessment: post-procedure vital signs reviewed and stable Respiratory status: spontaneous breathing, nonlabored ventilation, respiratory function stable and patient connected to nasal cannula oxygen Cardiovascular status: blood pressure returned to baseline and stable Postop Assessment: no apparent nausea or vomiting Anesthetic complications: no   No complications documented.  Last Vitals:  Vitals:   01/27/20 1455 01/27/20 1500  BP: 135/64 135/64  Pulse: 77 78  Resp: 13 14  Temp:    SpO2: 94% 99%    Last Pain:  Vitals:   01/27/20 1500  PainSc: 0-No pain                 Dorraine Yamili Lichtenwalner P Allye Hoyos

## 2020-02-29 ENCOUNTER — Ambulatory Visit (INDEPENDENT_AMBULATORY_CARE_PROVIDER_SITE_OTHER): Payer: Self-pay | Admitting: Vascular Surgery

## 2020-02-29 ENCOUNTER — Other Ambulatory Visit: Payer: Self-pay

## 2020-02-29 ENCOUNTER — Encounter: Payer: Self-pay | Admitting: Vascular Surgery

## 2020-02-29 VITALS — BP 198/97 | HR 82 | Temp 98.4°F | Resp 18 | Wt 296.0 lb

## 2020-02-29 DIAGNOSIS — Z992 Dependence on renal dialysis: Secondary | ICD-10-CM

## 2020-02-29 DIAGNOSIS — N186 End stage renal disease: Secondary | ICD-10-CM

## 2020-02-29 NOTE — Progress Notes (Signed)
   [facility] [place]  Patient name: Zachary Young MRN: [id number] DOB: January 24, 1961 Sex: male  REASON FOR VISIT: Follow-up hemodialysis access  HPI: Zachary Young is a 59 y.o. male here today for follow-up.  He underwent second stage basilic vein transposition fistula on 01/27/2020.  He continues to have hemodialysis via his right IJ catheter.  He reports that he has had some issues with clotting of his catheter.  Current Outpatient Medications  Medication Sig Dispense Refill  . aspirin EC 81 MG EC tablet Take 1 tablet (81 mg total) by mouth daily. 30 tablet 0  . cephALEXin (KEFLEX) 250 MG capsule Take 250 mg by mouth in the morning and at bedtime.    . diclofenac Sodium (VOLTAREN) 1 % GEL Apply 1 inch to BKA stump to help with pain 4 g 1  . ergocalciferol (VITAMIN D2) 1.25 MG (50000 UT) capsule Take 1 capsule (50,000 Units total) by mouth every Friday. 30 capsule 1  . HYDROcodone-acetaminophen (NORCO/VICODIN) 5-325 MG tablet Take 1 tablet by mouth every 6 (six) hours as needed for moderate pain. 20 tablet 0  . insulin aspart (NOVOLOG) 100 UNIT/ML injection Inject 10 Units into the skin 3 (three) times daily with meals. (Patient taking differently: Inject 4 Units into the skin 3 (three) times daily with meals. ) 10 mL 11  . insulin glargine (LANTUS) 100 UNIT/ML injection Inject 0.35 mLs (35 Units total) into the skin at bedtime. (Patient taking differently: Inject 10 Units into the skin at bedtime. ) 10 mL 11  . Insulin Pen Needle (PEN NEEDLES 31GX5/16") 31G X 8 MM MISC 1 each by Other route daily. 90 each 2  . lidocaine (LIDODERM) 5 % Place 1 patch onto the skin daily. Remove & Discard patch within 12 hours or as directed by MD 30 patch 0  . midodrine (PROAMATINE) 10 MG tablet Take 1 tablet (10 mg total) by mouth 3 (three) times daily with meals. 180 tablet 2  . pregabalin (LYRICA) 50 MG capsule Take 1 capsule (50 mg total) by mouth 2  (two) times daily. 60 capsule 2  . triamcinolone cream (KENALOG) 0.1 % Apply 1 application topically 2 (two) times daily.    Marland Kitchen albuterol (VENTOLIN HFA) 108 (90 Base) MCG/ACT inhaler Inhale 1 puff into the lungs every 4 (four) hours as needed for wheezing or shortness of breath. (Patient not taking: Reported on 01/22/2020) 6.7 g 0   No current facility-administered medications for this visit.     PHYSICAL EXAM: Vitals:   02/29/20 1504  BP: (!) 198/97  Pulse: 82  Resp: 18  Temp: 98.4 F (36.9 C)  TempSrc: Other (Comment)  SpO2: 98%  Weight: 296 lb (134.3 kg)    GENERAL: The patient is a well-nourished male, in no acute distress. The vital signs are documented above. On physical exam I do not feel a thrill in his fistula.  I imaged his fistula with SonoSite ultrasound.  There does not appear to be flow in the basilic vein fistula.  MEDICAL ISSUES: Failed basilic vein transposition fistula in his left arm.  Discussed this with the patient.  Explained that his next option would be left arm Gore-Tex graft.  Explained the limitations of this with potential thromboses.  Will coordinate this as an outpatient on a nondialysis day at [facility]   Rosetta Posner, MD [facility] Tel [phone]

## 2020-03-01 ENCOUNTER — Encounter (HOSPITAL_COMMUNITY): Payer: Self-pay

## 2020-03-01 ENCOUNTER — Telehealth: Payer: Self-pay

## 2020-03-01 ENCOUNTER — Emergency Department (HOSPITAL_COMMUNITY)
Admission: EM | Admit: 2020-03-01 | Discharge: 2020-03-01 | Disposition: A | Discharge status: Home / Self Care | Payer: Medicaid Other | Attending: Emergency Medicine | Admitting: Emergency Medicine

## 2020-03-01 ENCOUNTER — Other Ambulatory Visit: Payer: Self-pay

## 2020-03-01 DIAGNOSIS — E1122 Type 2 diabetes mellitus with diabetic chronic kidney disease: Secondary | ICD-10-CM | POA: Diagnosis not present

## 2020-03-01 DIAGNOSIS — F17210 Nicotine dependence, cigarettes, uncomplicated: Secondary | ICD-10-CM | POA: Insufficient documentation

## 2020-03-01 DIAGNOSIS — T82898A Other specified complication of vascular prosthetic devices, implants and grafts, initial encounter: Secondary | ICD-10-CM | POA: Diagnosis not present

## 2020-03-01 DIAGNOSIS — I509 Heart failure, unspecified: Secondary | ICD-10-CM | POA: Insufficient documentation

## 2020-03-01 DIAGNOSIS — Z992 Dependence on renal dialysis: Secondary | ICD-10-CM | POA: Insufficient documentation

## 2020-03-01 DIAGNOSIS — Z7982 Long term (current) use of aspirin: Secondary | ICD-10-CM | POA: Diagnosis not present

## 2020-03-01 DIAGNOSIS — I132 Hypertensive heart and chronic kidney disease with heart failure and with stage 5 chronic kidney disease, or end stage renal disease: Secondary | ICD-10-CM | POA: Diagnosis not present

## 2020-03-01 DIAGNOSIS — Z794 Long term (current) use of insulin: Secondary | ICD-10-CM | POA: Insufficient documentation

## 2020-03-01 DIAGNOSIS — N185 Chronic kidney disease, stage 5: Secondary | ICD-10-CM | POA: Diagnosis not present

## 2020-03-01 DIAGNOSIS — T829XXA Unspecified complication of cardiac and vascular prosthetic device, implant and graft, initial encounter: Secondary | ICD-10-CM

## 2020-03-01 LAB — BASIC METABOLIC PANEL
Anion gap: 10 (ref 5–15)
BUN: 37 mg/dL — ABNORMAL HIGH (ref 6–20)
CO2: 20 mmol/L — ABNORMAL LOW (ref 22–32)
Calcium: 8 mg/dL — ABNORMAL LOW (ref 8.9–10.3)
Chloride: 108 mmol/L (ref 98–111)
Creatinine, Ser: 3.93 mg/dL — ABNORMAL HIGH (ref 0.61–1.24)
GFR, Estimated: 17 mL/min — ABNORMAL LOW (ref >60)
Glucose, Bld: 153 mg/dL — ABNORMAL HIGH (ref 70–99)
Potassium: 4.3 mmol/L (ref 3.5–5.1)
Sodium: 138 mmol/L (ref 135–145)

## 2020-03-01 LAB — CBC
HCT: 30 % — ABNORMAL LOW (ref 39.0–52.0)
Hemoglobin: 9 g/dL — ABNORMAL LOW (ref 13.0–17.0)
MCH: 27.4 pg (ref 26.0–34.0)
MCHC: 30 g/dL (ref 30.0–36.0)
MCV: 91.2 fL (ref 80.0–100.0)
Platelets: 377 10*3/uL (ref 150–400)
RBC: 3.29 MIL/uL — ABNORMAL LOW (ref 4.22–5.81)
RDW: 16.1 % — ABNORMAL HIGH (ref 11.5–15.5)
WBC: 8.6 10*3/uL (ref 4.0–10.5)
nRBC: 0 % (ref 0.0–0.2)
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
## 2020-03-01 NOTE — ED Notes (Signed)
Pt here d/t "port not working". Denies any other complaints or concerns.

## 2020-03-01 NOTE — Telephone Encounter (Signed)
Returned pt's sister [name]) call regarding their desire for pt to have his surgery scheduled sometime after Thanksgiving. Surgery scheduler is aware of this request.

## 2020-03-01 NOTE — ED Triage Notes (Signed)
Pt reports dialysis catheter in chest is clogged. Pt last dialysis treatment was Saturday and he states it was clogging up then. Pt due for dialysis today.

## 2020-03-01 NOTE — ED Notes (Signed)
Pt refused labs in triage, requesting we draw from his dialysis catheter but this RN explained that we are unable to do so.

## 2020-03-01 NOTE — ED Notes (Signed)
Noticed that pt was wheeling himself down the hall, explained to pt that he needed to wait for discharge papers as he wasn't up for discharge yet. Pt began to yell and cuss at this RN as he wheeled himself down the hall very fast. Pt refusing to stay for discharge paperwork.

## 2020-03-02 ENCOUNTER — Telehealth (INDEPENDENT_AMBULATORY_CARE_PROVIDER_SITE_OTHER): Payer: Self-pay

## 2020-03-02 ENCOUNTER — Other Ambulatory Visit
Admission: RE | Admit: 2020-03-02 | Discharge: 2020-03-02 | Disposition: A | Discharge status: Home / Self Care | Payer: Medicaid Other | Source: Ambulatory Visit | Attending: Vascular Surgery | Admitting: Vascular Surgery

## 2020-03-02 DIAGNOSIS — Z20822 Contact with and (suspected) exposure to covid-19: Secondary | ICD-10-CM | POA: Insufficient documentation

## 2020-03-02 DIAGNOSIS — Z01812 Encounter for preprocedural laboratory examination: Secondary | ICD-10-CM | POA: Insufficient documentation

## 2020-03-02 LAB — SARS CORONAVIRUS 2 (TAT 6-24 HRS): SARS Coronavirus 2: NEGATIVE

## 2020-03-02 NOTE — Telephone Encounter (Signed)
Spoke with the patient and he is now scheduled with Dr. Lucky Cowboy for a permcath exchange on 03/03/20 with a 9:15 am arrival time to the MM. Covid testing on 03/02/20 between 8-1 pm at the [facility]. Pre-procedure instructions were discussed.

## 2020-03-03 ENCOUNTER — Other Ambulatory Visit (INDEPENDENT_AMBULATORY_CARE_PROVIDER_SITE_OTHER): Payer: Self-pay | Admitting: Nurse Practitioner

## 2020-03-03 ENCOUNTER — Encounter: Payer: Self-pay | Admitting: Vascular Surgery

## 2020-03-03 ENCOUNTER — Encounter
Admission: RE | Disposition: A | Discharge status: Home / Self Care | Payer: Self-pay | Source: Home / Self Care | Attending: Vascular Surgery

## 2020-03-03 ENCOUNTER — Ambulatory Visit
Admission: RE | Admit: 2020-03-03 | Discharge: 2020-03-03 | Disposition: A | Discharge status: Home / Self Care | Payer: Medicaid Other | Attending: Vascular Surgery | Admitting: Vascular Surgery

## 2020-03-03 ENCOUNTER — Other Ambulatory Visit: Payer: Self-pay

## 2020-03-03 DIAGNOSIS — I120 Hypertensive chronic kidney disease with stage 5 chronic kidney disease or end stage renal disease: Secondary | ICD-10-CM | POA: Insufficient documentation

## 2020-03-03 DIAGNOSIS — Z4901 Encounter for fitting and adjustment of extracorporeal dialysis catheter: Secondary | ICD-10-CM | POA: Diagnosis not present

## 2020-03-03 DIAGNOSIS — Z885 Allergy status to narcotic agent status: Secondary | ICD-10-CM | POA: Insufficient documentation

## 2020-03-03 DIAGNOSIS — N185 Chronic kidney disease, stage 5: Secondary | ICD-10-CM

## 2020-03-03 DIAGNOSIS — F17210 Nicotine dependence, cigarettes, uncomplicated: Secondary | ICD-10-CM | POA: Diagnosis not present

## 2020-03-03 DIAGNOSIS — N186 End stage renal disease: Secondary | ICD-10-CM | POA: Insufficient documentation

## 2020-03-03 DIAGNOSIS — E1122 Type 2 diabetes mellitus with diabetic chronic kidney disease: Secondary | ICD-10-CM | POA: Insufficient documentation

## 2020-03-03 HISTORY — PX: DIALYSIS/PERMA CATHETER INSERTION: CATH118288

## 2020-03-03 LAB — GLUCOSE, CAPILLARY
Glucose-Capillary: 133 mg/dL — ABNORMAL HIGH (ref 70–99)
Glucose-Capillary: 70 mg/dL (ref 70–99)

## 2020-03-03 SURGERY — DIALYSIS/PERMA CATHETER INSERTION
Anesthesia: Moderate Sedation

## 2020-03-03 MED ORDER — CEFAZOLIN SODIUM-DEXTROSE 2-4 GM/100ML-% IV SOLN
2.0000 g | Freq: Once | INTRAVENOUS | Status: AC
  Administered 2020-03-03: 2 g via INTRAVENOUS

## 2020-03-03 MED ORDER — FENTANYL CITRATE (PF) 100 MCG/2ML IJ SOLN
INTRAMUSCULAR | Status: AC
  Filled 2020-03-03: qty 2

## 2020-03-03 MED ORDER — FENTANYL CITRATE (PF) 100 MCG/2ML IJ SOLN
INTRAMUSCULAR | Status: DC | PRN
  Administered 2020-03-03: 50 ug via INTRAVENOUS

## 2020-03-03 MED ORDER — SODIUM CHLORIDE 0.9 % IV SOLN: INTRAVENOUS | Status: DC

## 2020-03-03 MED ORDER — MIDAZOLAM HCL 2 MG/2ML IJ SOLN
INTRAMUSCULAR | Status: DC | PRN
  Administered 2020-03-03: 2 mg via INTRAVENOUS

## 2020-03-03 MED ORDER — MIDAZOLAM HCL 2 MG/ML PO SYRP: 8.0000 mg | ORAL_SOLUTION | Freq: Once | ORAL | Status: DC | PRN

## 2020-03-03 MED ORDER — FAMOTIDINE 20 MG PO TABS: 40.0000 mg | ORAL_TABLET | Freq: Once | ORAL | Status: DC | PRN

## 2020-03-03 MED ORDER — POTASSIUM CHLORIDE CRYS ER 20 MEQ PO TBCR
EXTENDED_RELEASE_TABLET | ORAL | Status: AC
  Filled 2020-03-03: qty 2

## 2020-03-03 MED ORDER — MIDAZOLAM HCL 5 MG/5ML IJ SOLN
INTRAMUSCULAR | Status: AC
  Filled 2020-03-03: qty 5

## 2020-03-03 MED ORDER — POTASSIUM CHLORIDE CRYS ER 20 MEQ PO TBCR: 40.0000 meq | EXTENDED_RELEASE_TABLET | Freq: Once | ORAL | Status: DC

## 2020-03-03 MED ORDER — METHYLPREDNISOLONE SODIUM SUCC 125 MG IJ SOLR: 125.0000 mg | Freq: Once | INTRAMUSCULAR | Status: DC | PRN

## 2020-03-03 MED ORDER — ONDANSETRON HCL 4 MG/2ML IJ SOLN: 4.0000 mg | Freq: Four times a day (QID) | INTRAMUSCULAR | Status: DC | PRN

## 2020-03-03 MED ORDER — FENTANYL CITRATE (PF) 100 MCG/2ML IJ SOLN: 12.5000 ug | Freq: Once | INTRAMUSCULAR | Status: DC | PRN

## 2020-03-03 MED ORDER — DIPHENHYDRAMINE HCL 50 MG/ML IJ SOLN: 50.0000 mg | Freq: Once | INTRAMUSCULAR | Status: DC | PRN

## 2020-03-03 SURGICAL SUPPLY — 9 items
BIOPATCH RED 1 DISK 7.0 (GAUZE/BANDAGES/DRESSINGS) ×2 IMPLANT
BIOPATCH RED 1IN DISK 7.0MM (GAUZE/BANDAGES/DRESSINGS) ×1
CATH PALIN MAXID VT KIT 19CM (CATHETERS) ×3 IMPLANT
COVER DRAPE FLUORO 36X44 (DRAPES) ×3 IMPLANT
GUIDEWIRE SUPER STIFF .035X180 (WIRE) ×3 IMPLANT
PACK ANGIOGRAPHY (CUSTOM PROCEDURE TRAY) ×3 IMPLANT
SUT MNCRL AB 4-0 PS2 18 (SUTURE) ×3 IMPLANT
SUT PROLENE 0 CT 1 30 (SUTURE) ×3 IMPLANT
TOWEL OR 17X26 4PK STRL BLUE (TOWEL DISPOSABLE) ×3 IMPLANT

## 2020-03-03 NOTE — Progress Notes (Signed)
Patient difficult IV placement, placed #22g RAC, pre-op K 2.5, Dr. Lucky Cowboy aware, will proceed with procedure and supplement in post op.  Blood glucose 70.  LUE AV graft without bruit/thrill, patient states he needs to return next week for further "work" on graft. Lungs diminished thru out, exp wheeze when patient asleep: upper lobes.  Dry non-productive cough noted, afebrile.

## 2020-03-03 NOTE — Progress Notes (Signed)
Patient refused blood glucose check in post-op, 'I need to leave and get a burger and a smoke"   Per Dr. Lucky Cowboy 51meq Kdur po adminstered prior to discharge.  Copies of procedure note given to patient for HD appt later today.  No c/o's at discharge

## 2020-03-03 NOTE — H&P (Signed)
[facility] Admission History & Physical  MRN : [id number]  Zachary Young is a 59 y.o. (06/02/1960) male who presents with chief complaint of No chief complaint on file. Marland Kitchen  History of Present Illness:  I am asked to evaluate the patient by the dialysis center. The patient was sent here because they were unable to achieve adequate dialysis yesterday. Furthermore the Center states they were unable to aspirate either lumen of the catheter yesterday.  This problem has been getting worse for about 1 week. The patient is unaware of any other change.   Patient denies pain or tenderness overlying the access.  There is no pain with dialysis.  Patient denies fevers or shaking chills while on dialysis.    The patient has apparently had a fistula placed in [place] but is not yet ready for dialysis.  The patient is not chronically hypotensive on dialysis.  He is new to our practice.   No current facility-administered medications for this encounter.   Past Medical History End-stage renal disease Hypertension Diabetes Tobacco use Status post right leg amputation  Past Surgical History:  Procedure Laterality Date  . AMPUTATION Right 10/21/2019   Procedure: RIGHT BELOW KNEE AMPUTATION;  Surgeon: Newt Minion, MD;  Location: [facility];  Service: Orthopedics;  Laterality: Right;  . AV FISTULA PLACEMENT Left 11/13/2019   Procedure: LEFT ARTERIOVENOUS (AV) FISTULA VERSUS ARTERIOVENOUS GRAFT;  Surgeon: Rosetta Posner, MD;  Location: [facility];  Service: Vascular;  Laterality: Left;  . BASCILIC VEIN TRANSPOSITION Left 01/27/2020   Procedure: LEFT ARM 2ND STAGE BASCILIC VEIN TRANSPOSITION;  Surgeon: Rosetta Posner, MD;  Location: [facility];  Service: Vascular;  Laterality: Left;  . IR FLUORO GUIDE CV LINE RIGHT  11/05/2019  . IR US GUIDE VASC ACCESS RIGHT  11/05/2019  . TOE AMPUTATION Bilateral    due to osteomyelitis, all 5 each foot     Social History   Tobacco Use  . Smoking status:  Current Every Day Smoker    Packs/day: 0.50    Types: Cigarettes  . Smokeless tobacco: Never Used  Vaping Use  . Vaping Use: Never used  Substance Use Topics  . Alcohol use: Not Currently  . Drug use: Yes    Types: Cocaine     Family History  Problem Relation Age of Onset  . Cancer Mother     No family history of bleeding or clotting disorders, autoimmune disease or porphyria  Allergies  Allergen Reactions  . Bee Venom Anaphylaxis    Per pt, nearly died from bee sting as a child  . Propofol Other (See Comments)     Hallucinations  . Morphine Nausea And Vomiting  . Oxycodone Nausea And Vomiting     REVIEW OF SYSTEMS (Negative unless checked)  Constitutional: [] Weight loss  [] Fever  [] Chills Cardiac: [] Chest pain   [] Chest pressure   [] Palpitations   [] Shortness of breath when laying flat   [] Shortness of breath at rest   [x] Shortness of breath with exertion. Vascular:  [] Pain in legs with walking   [] Pain in legs at rest   [] Pain in legs when laying flat   [] Claudication   [] Pain in feet when walking  [] Pain in feet at rest  [] Pain in feet when laying flat   [] History of DVT   [] Phlebitis   [] Swelling in legs   [] Varicose veins   [] Non-healing ulcers Pulmonary:   [] Uses home oxygen   [] Productive cough   [] Hemoptysis   [] Wheeze  []   COPD   [] Asthma Neurologic:  [] Dizziness  [] Blackouts   [] Seizures   [] History of stroke   [] History of TIA  [] Aphasia   [] Temporary blindness   [] Dysphagia   [] Weakness or numbness in arms   [] Weakness or numbness in legs Musculoskeletal:  [x] Arthritis   [] Joint swelling   [] Joint pain   [] Low back pain Hematologic:  [] Easy bruising  [] Easy bleeding   [] Hypercoagulable state   [x] Anemic  [] Hepatitis Gastrointestinal:  [] Blood in stool   [] Vomiting blood  [] Gastroesophageal reflux/heartburn   [] Difficulty swallowing. Genitourinary:  [x] Chronic kidney disease   [] Difficult urination  [] Frequent urination  [] Burning with urination   [] Blood in  urine Skin:  [] Rashes   [] Ulcers   [] Wounds Psychological:  [] History of anxiety   []  History of major depression.  Physical Examination  There were no vitals filed for this visit. There is no height or weight on file to calculate BMI. Gen: WD/WN, NAD Head: [place]/AT, No temporalis wasting.  Ear/Nose/Throat: Hearing grossly intact, nares w/o erythema or drainage, oropharynx w/o Erythema/Exudate,  Eyes: Conjunctiva clear, sclera non-icteric Neck: Trachea midline.  No JVD.  Pulmonary:  Good air movement, respirations not labored, no use of accessory muscles.  Cardiac: RRR, normal S1, S2. Vascular: Left arm AV fistula with soft thrill.  Right tunneled catheter without tenderness or drainage Vessel Right Left  Radial Palpable Palpable  Gastrointestinal: soft, non-tender/non-distended. No guarding/reflex.  Musculoskeletal: M/S 5/5 throughout.  Extremities without ischemic changes.  Right BKA Neurologic: Sensation grossly intact in extremities.  Symmetrical.  Speech is fluent. Motor exam as listed above. Psychiatric: Judgment intact, Mood & affect appropriate for pt's clinical situation. Dermatologic: No rashes or ulcers noted.  No cellulitis or open wounds.    CBC Lab Results  Component Value Date   WBC 8.6 03/01/2020   HGB 9.0 (L) 03/01/2020   HCT 30.0 (L) 03/01/2020   MCV 91.2 03/01/2020   PLT 377 03/01/2020    BMET    Component Value Date/Time   NA 138 03/01/2020 1547   K 4.3 03/01/2020 1547   CL 108 03/01/2020 1547   CO2 20 (L) 03/01/2020 1547   GLUCOSE 153 (H) 03/01/2020 1547   BUN 37 (H) 03/01/2020 1547   CREATININE 3.93 (H) 03/01/2020 1547   CALCIUM 8.0 (L) 03/01/2020 1547   CALCIUM 9.1 11/06/2019 1238   GFRNONAA 17 (L) 03/01/2020 1547   GFRAA 9 (L) 12/12/2019 0750   Estimated Creatinine Clearance: 30.6 mL/min (A) (by C-G formula based on SCr of 3.93 mg/dL (H)).  COAG Lab Results  Component Value Date   INR 1.0 11/05/2019   INR 1.0 08/30/2019   INR 1.0  12/15/2018    Radiology No results found.  Assessment/Plan 1.  Complication dialysis device with poorly functioning catheter and fistula not yet ready to be used:  Patient's tunneled catheter is thrombosed. The patient will undergo exchange of the catheter same venous access using interventional techniques.  The risks and benefits were described to the patient.  All questions were answered.  The patient agrees to proceed with intervention.  2.  End-stage renal disease requiring hemodialysis:  Patient will continue dialysis therapy without further interruption if a successful exchange is not achieved then new site will be found for tunneled catheter placement. Dialysis has already been arranged since the patient missed their previous session 3.  Hypertension:  Patient will continue medical management; nephrology is following no changes in oral medications. 4. Diabetes mellitus:  Glucose will be monitored and oral medications been  held this morning once the patient has undergone the patient's procedure po intake will be reinitiated and again Accu-Cheks will be used to assess the blood glucose level and treat as needed. The patient will be restarted on the patient's usual hypoglycemic regime     Leotis Pain, MD  03/03/2020 8:46 AM

## 2020-03-03 NOTE — Op Note (Signed)
OPERATIVE NOTE    PRE-OPERATIVE DIAGNOSIS: 1. ESRD 2. Non-functional permcath  POST-OPERATIVE DIAGNOSIS: same as above  PROCEDURE: 1. Fluoroscopic guidance for placement of catheter 2. Placement of a 19 cm tip to cuff tunneled hemodialysis catheter via the right internal jugular vein and removal of previous catheter  SURGEON: Leotis Pain, MD  ANESTHESIA:  Local with moderate conscious sedation for 14 minutes using 2 mg of Versed and 50 mcg of Fentanyl  ESTIMATED BLOOD LOSS: 3 cc  FINDING(S): none  SPECIMEN(S):  None  INDICATIONS:   Patient is a 59 y.o.male who presents with non-functional dialysis catheter and ESRD.  The patient needs long term dialysis access for their ESRD, and a Permcath is necessary.  Risks and benefits are discussed and informed consent is obtained.    DESCRIPTION: After obtaining full informed written consent, the patient was brought back to the vascular suite. The patient received moderate conscious sedation during a face-to-face encounter with me present throughout the entire procedure and supervising the RN monitoring the vital signs, pulse oximetry, telemetry, and mental status throughout the entire procedure. The patient's existing catheter, right neck and chest were sterilely prepped and draped in a sterile surgical field was created.  The existing catheter was dissected free from the fibrous sheath securing the cuff with hemostats and blunt dissection.  A wire was placed. The existing catheter was then removed and the wire used to keep venous access. I selected a 19 cm tip to cuff tunneled dialysis catheter.  Using fluoroscopic guidance the catheter tips were parked in the right atrium. The appropriate distal connectors were placed. It withdrew blood well and flushed easily with heparinized saline and a concentrated heparin solution was then placed. It was secured to the chest wall with 2 Prolene sutures. A 4-0 Monocryl pursestring suture was placed around the  exit site. Sterile dressings were placed. The patient tolerated the procedure well and was taken to the recovery room in stable condition.  COMPLICATIONS: None  CONDITION: Stable  Leotis Pain 03/03/2020 10:52 AM   This note was created with Dragon Medical transcription system. Any errors in dictation are purely unintentional.

## 2020-03-09 ENCOUNTER — Telehealth: Payer: Self-pay

## 2020-03-09 NOTE — Telephone Encounter (Signed)
Attempted to reach pt x 3 attempts to schedule for L arm AVGG with messages left with contact information to return call. UTR letter mailed.

## 2020-03-14 ENCOUNTER — Telehealth: Payer: Self-pay

## 2020-03-14 NOTE — Telephone Encounter (Signed)
Patient called c/o swelling in his leg. He is still able to move it, but it hurts. Known large DVT - patient on Eliquis. Advised him not to up his dose of Eliquis. We discussed that being the treatment for DVT and he would f/u in the office with Dr. Donzetta Matters.

## 2020-03-15 ENCOUNTER — Emergency Department (HOSPITAL_COMMUNITY)
Admission: EM | Admit: 2020-03-15 | Discharge: 2020-03-15 | Disposition: A | Discharge status: Home / Self Care | Payer: Medicaid Other | Attending: Emergency Medicine | Admitting: Emergency Medicine

## 2020-03-15 ENCOUNTER — Encounter (HOSPITAL_COMMUNITY): Payer: Self-pay | Admitting: Emergency Medicine

## 2020-03-15 DIAGNOSIS — E1140 Type 2 diabetes mellitus with diabetic neuropathy, unspecified: Secondary | ICD-10-CM | POA: Insufficient documentation

## 2020-03-15 DIAGNOSIS — N185 Chronic kidney disease, stage 5: Secondary | ICD-10-CM | POA: Insufficient documentation

## 2020-03-15 DIAGNOSIS — I132 Hypertensive heart and chronic kidney disease with heart failure and with stage 5 chronic kidney disease, or end stage renal disease: Secondary | ICD-10-CM | POA: Diagnosis not present

## 2020-03-15 DIAGNOSIS — Z992 Dependence on renal dialysis: Secondary | ICD-10-CM | POA: Insufficient documentation

## 2020-03-15 DIAGNOSIS — Z794 Long term (current) use of insulin: Secondary | ICD-10-CM | POA: Insufficient documentation

## 2020-03-15 DIAGNOSIS — F17210 Nicotine dependence, cigarettes, uncomplicated: Secondary | ICD-10-CM | POA: Insufficient documentation

## 2020-03-15 DIAGNOSIS — E1159 Type 2 diabetes mellitus with other circulatory complications: Secondary | ICD-10-CM | POA: Diagnosis not present

## 2020-03-15 DIAGNOSIS — I5042 Chronic combined systolic (congestive) and diastolic (congestive) heart failure: Secondary | ICD-10-CM | POA: Insufficient documentation

## 2020-03-15 DIAGNOSIS — T829XXA Unspecified complication of cardiac and vascular prosthetic device, implant and graft, initial encounter: Secondary | ICD-10-CM

## 2020-03-15 DIAGNOSIS — E785 Hyperlipidemia, unspecified: Secondary | ICD-10-CM | POA: Diagnosis not present

## 2020-03-15 DIAGNOSIS — T8249XA Other complication of vascular dialysis catheter, initial encounter: Secondary | ICD-10-CM | POA: Diagnosis present

## 2020-03-15 DIAGNOSIS — E1169 Type 2 diabetes mellitus with other specified complication: Secondary | ICD-10-CM | POA: Diagnosis not present

## 2020-03-15 DIAGNOSIS — Z79899 Other long term (current) drug therapy: Secondary | ICD-10-CM | POA: Insufficient documentation

## 2020-03-15 DIAGNOSIS — Z7982 Long term (current) use of aspirin: Secondary | ICD-10-CM | POA: Insufficient documentation

## 2020-03-15 DIAGNOSIS — E1122 Type 2 diabetes mellitus with diabetic chronic kidney disease: Secondary | ICD-10-CM | POA: Diagnosis not present

## 2020-03-15 LAB — CBC WITH DIFFERENTIAL/PLATELET
Abs Immature Granulocytes: 0.03 10*3/uL (ref 0.00–0.07)
Basophils Absolute: 0 10*3/uL (ref 0.0–0.1)
Basophils Relative: 0 %
Eosinophils Absolute: 0.2 10*3/uL (ref 0.0–0.5)
Eosinophils Relative: 3 %
HCT: 28.5 % — ABNORMAL LOW (ref 39.0–52.0)
Hemoglobin: 8.7 g/dL — ABNORMAL LOW (ref 13.0–17.0)
Immature Granulocytes: 0 %
Lymphocytes Relative: 21 %
Lymphs Abs: 1.6 10*3/uL (ref 0.7–4.0)
MCH: 26.9 pg (ref 26.0–34.0)
MCHC: 30.5 g/dL (ref 30.0–36.0)
MCV: 88 fL (ref 80.0–100.0)
Monocytes Absolute: 0.4 10*3/uL (ref 0.1–1.0)
Monocytes Relative: 5 %
Neutro Abs: 5.5 10*3/uL (ref 1.7–7.7)
Neutrophils Relative %: 71 %
Platelets: 397 10*3/uL (ref 150–400)
RBC: 3.24 MIL/uL — ABNORMAL LOW (ref 4.22–5.81)
RDW: 16.5 % — ABNORMAL HIGH (ref 11.5–15.5)
WBC: 7.8 10*3/uL (ref 4.0–10.5)
nRBC: 0 % (ref 0.0–0.2)

## 2020-03-15 LAB — COMPREHENSIVE METABOLIC PANEL
ALT: 9 U/L (ref 0–44)
AST: 10 U/L — ABNORMAL LOW (ref 15–41)
Albumin: 2.6 g/dL — ABNORMAL LOW (ref 3.5–5.0)
Alkaline Phosphatase: 87 U/L (ref 38–126)
Anion gap: 12 (ref 5–15)
BUN: 36 mg/dL — ABNORMAL HIGH (ref 6–20)
CO2: 22 mmol/L (ref 22–32)
Calcium: 8.6 mg/dL — ABNORMAL LOW (ref 8.9–10.3)
Chloride: 105 mmol/L (ref 98–111)
Creatinine, Ser: 4.09 mg/dL — ABNORMAL HIGH (ref 0.61–1.24)
GFR, Estimated: 16 mL/min — ABNORMAL LOW (ref >60)
Glucose, Bld: 280 mg/dL — ABNORMAL HIGH (ref 70–99)
Potassium: 3.7 mmol/L (ref 3.5–5.1)
Sodium: 139 mmol/L (ref 135–145)
Total Bilirubin: 0.5 mg/dL (ref 0.3–1.2)
Total Protein: 7.3 g/dL (ref 6.5–8.1)

## 2020-03-15 NOTE — ED Triage Notes (Signed)
Pt arrives from renal doctor in [place] for c/o clogged dialysis catheter, per provider, pt has not had a good dialysis session for a few weeks due to issues with his catheter. Attempted session today but was unable to be fully dialyzed.  Seen here on 11/16 for the same.

## 2020-03-15 NOTE — Discharge Instructions (Addendum)
Please go to [facility] [place], [place], [place] [address] Tomorrow at [facility] for likely vascular catheter exchange.  Please bring someone with you to [facility] practice as you may be given sedating meds.
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
## 2020-03-15 NOTE — ED Notes (Signed)
Patient assisted to the bathroom in personal wheelchair then out to the lobby as requested to await his sister. Patient with AVS and understanding of f/u appt tomorrow for line issue. No questions. All belongings with patient.

## 2020-03-16 ENCOUNTER — Emergency Department: Payer: Medicaid Other

## 2020-03-16 ENCOUNTER — Ambulatory Visit: Admit: 2020-03-16 | Payer: Medicaid Other | Admitting: Vascular Surgery

## 2020-03-16 ENCOUNTER — Encounter: Payer: Self-pay | Admitting: Emergency Medicine

## 2020-03-16 ENCOUNTER — Emergency Department
Admission: EM | Admit: 2020-03-16 | Discharge: 2020-03-16 | Discharge status: Against Medical Advice | Payer: Medicaid Other | Attending: Emergency Medicine | Admitting: Emergency Medicine

## 2020-03-16 ENCOUNTER — Other Ambulatory Visit (INDEPENDENT_AMBULATORY_CARE_PROVIDER_SITE_OTHER): Payer: Self-pay | Admitting: Vascular Surgery

## 2020-03-16 ENCOUNTER — Other Ambulatory Visit: Payer: Self-pay

## 2020-03-16 DIAGNOSIS — E1140 Type 2 diabetes mellitus with diabetic neuropathy, unspecified: Secondary | ICD-10-CM | POA: Insufficient documentation

## 2020-03-16 DIAGNOSIS — Z794 Long term (current) use of insulin: Secondary | ICD-10-CM | POA: Insufficient documentation

## 2020-03-16 DIAGNOSIS — F17210 Nicotine dependence, cigarettes, uncomplicated: Secondary | ICD-10-CM | POA: Insufficient documentation

## 2020-03-16 DIAGNOSIS — N185 Chronic kidney disease, stage 5: Secondary | ICD-10-CM | POA: Diagnosis not present

## 2020-03-16 DIAGNOSIS — Z79899 Other long term (current) drug therapy: Secondary | ICD-10-CM | POA: Diagnosis not present

## 2020-03-16 DIAGNOSIS — Z7982 Long term (current) use of aspirin: Secondary | ICD-10-CM | POA: Insufficient documentation

## 2020-03-16 DIAGNOSIS — T829XXA Unspecified complication of cardiac and vascular prosthetic device, implant and graft, initial encounter: Secondary | ICD-10-CM

## 2020-03-16 DIAGNOSIS — T8249XA Other complication of vascular dialysis catheter, initial encounter: Secondary | ICD-10-CM | POA: Insufficient documentation

## 2020-03-16 DIAGNOSIS — I132 Hypertensive heart and chronic kidney disease with heart failure and with stage 5 chronic kidney disease, or end stage renal disease: Secondary | ICD-10-CM | POA: Insufficient documentation

## 2020-03-16 DIAGNOSIS — Z992 Dependence on renal dialysis: Secondary | ICD-10-CM | POA: Diagnosis not present

## 2020-03-16 DIAGNOSIS — I5042 Chronic combined systolic (congestive) and diastolic (congestive) heart failure: Secondary | ICD-10-CM | POA: Diagnosis not present

## 2020-03-16 DIAGNOSIS — E1122 Type 2 diabetes mellitus with diabetic chronic kidney disease: Secondary | ICD-10-CM | POA: Diagnosis not present

## 2020-03-16 LAB — COMPREHENSIVE METABOLIC PANEL
ALT: 6 U/L (ref 0–44)
AST: 10 U/L — ABNORMAL LOW (ref 15–41)
Albumin: 2.9 g/dL — ABNORMAL LOW (ref 3.5–5.0)
Alkaline Phosphatase: 92 U/L (ref 38–126)
Anion gap: 11 (ref 5–15)
BUN: 38 mg/dL — ABNORMAL HIGH (ref 6–20)
CO2: 21 mmol/L — ABNORMAL LOW (ref 22–32)
Calcium: 8.8 mg/dL — ABNORMAL LOW (ref 8.9–10.3)
Chloride: 105 mmol/L (ref 98–111)
Creatinine, Ser: 3.84 mg/dL — ABNORMAL HIGH (ref 0.61–1.24)
GFR, Estimated: 17 mL/min — ABNORMAL LOW (ref >60)
Glucose, Bld: 150 mg/dL — ABNORMAL HIGH (ref 70–99)
Potassium: 3.6 mmol/L (ref 3.5–5.1)
Sodium: 137 mmol/L (ref 135–145)
Total Bilirubin: 0.7 mg/dL (ref 0.3–1.2)
Total Protein: 7.2 g/dL (ref 6.5–8.1)

## 2020-03-16 LAB — CBC WITH DIFFERENTIAL/PLATELET
Abs Immature Granulocytes: 0.04 10*3/uL (ref 0.00–0.07)
Basophils Absolute: 0 10*3/uL (ref 0.0–0.1)
Basophils Relative: 0 %
Eosinophils Absolute: 0.2 10*3/uL (ref 0.0–0.5)
Eosinophils Relative: 3 %
HCT: 25.9 % — ABNORMAL LOW (ref 39.0–52.0)
Hemoglobin: 8.2 g/dL — ABNORMAL LOW (ref 13.0–17.0)
Immature Granulocytes: 1 %
Lymphocytes Relative: 23 %
Lymphs Abs: 1.6 10*3/uL (ref 0.7–4.0)
MCH: 27.2 pg (ref 26.0–34.0)
MCHC: 31.7 g/dL (ref 30.0–36.0)
MCV: 85.8 fL (ref 80.0–100.0)
Monocytes Absolute: 0.5 10*3/uL (ref 0.1–1.0)
Monocytes Relative: 7 %
Neutro Abs: 4.9 10*3/uL (ref 1.7–7.7)
Neutrophils Relative %: 66 %
Platelets: 344 10*3/uL (ref 150–400)
RBC: 3.02 MIL/uL — ABNORMAL LOW (ref 4.22–5.81)
RDW: 16.6 % — ABNORMAL HIGH (ref 11.5–15.5)
WBC: 7.3 10*3/uL (ref 4.0–10.5)
nRBC: 0 % (ref 0.0–0.2)

## 2020-03-16 SURGERY — DIALYSIS/PERMA CATHETER INSERTION
Anesthesia: Moderate Sedation

## 2020-03-16 NOTE — ED Notes (Signed)
This RN at bedside to obtain EKG. Pt stating, "I only need my catheter unclogged, why do y'all keep doing this extra stuff." This RN explaining to pt the need to do lab work as well as EKG since he has not been able to receive dialysis.

## 2020-03-16 NOTE — ED Triage Notes (Signed)
Arrives with c/o dialysis catheter not working.  Last dialysis on Saturday.  Catheter placed last month.  AAOx3.  Skin warm and dry. NAD
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
## 2020-03-16 NOTE — ED Notes (Signed)
Attempted to draw blood in triage, unsuccessful.  Phlebotomy called.

## 2020-03-16 NOTE — ED Notes (Signed)
MD at bedside. Waiting for phlebotomy to draw labs
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
## 2020-03-16 NOTE — ED Notes (Signed)
Phlebotomy remains at bedside attempting blood draw. Will obtain EKG when phlebotomist is finished.

## 2020-03-16 NOTE — ED Notes (Signed)
Lab at bedside for blood draw.

## 2020-03-21 ENCOUNTER — Telehealth: Payer: Self-pay

## 2020-03-21 NOTE — Telephone Encounter (Signed)
VM left by patient's sister about scheduling AVG surgery. Looking at recent ED notes - patient has a malfunctioning TDC and needs immediate dialysis. Left VM for a call back, patient needs to return to the hospital for replacement [facility] and HD.

## 2020-03-24 ENCOUNTER — Other Ambulatory Visit: Payer: Self-pay

## 2020-03-29 ENCOUNTER — Encounter (HOSPITAL_COMMUNITY): Payer: Self-pay | Admitting: Emergency Medicine

## 2020-03-29 ENCOUNTER — Emergency Department (HOSPITAL_COMMUNITY): Payer: Medicaid Other

## 2020-03-29 ENCOUNTER — Emergency Department (HOSPITAL_COMMUNITY)
Admission: EM | Admit: 2020-03-29 | Discharge: 2020-03-30 | Disposition: A | Discharge status: Home / Self Care | Payer: Medicaid Other | Attending: Emergency Medicine | Admitting: Emergency Medicine

## 2020-03-29 ENCOUNTER — Other Ambulatory Visit: Payer: Self-pay

## 2020-03-29 DIAGNOSIS — E1140 Type 2 diabetes mellitus with diabetic neuropathy, unspecified: Secondary | ICD-10-CM | POA: Diagnosis not present

## 2020-03-29 DIAGNOSIS — Z79899 Other long term (current) drug therapy: Secondary | ICD-10-CM | POA: Diagnosis not present

## 2020-03-29 DIAGNOSIS — F17210 Nicotine dependence, cigarettes, uncomplicated: Secondary | ICD-10-CM | POA: Insufficient documentation

## 2020-03-29 DIAGNOSIS — E1159 Type 2 diabetes mellitus with other circulatory complications: Secondary | ICD-10-CM | POA: Insufficient documentation

## 2020-03-29 DIAGNOSIS — E1122 Type 2 diabetes mellitus with diabetic chronic kidney disease: Secondary | ICD-10-CM | POA: Insufficient documentation

## 2020-03-29 DIAGNOSIS — Z794 Long term (current) use of insulin: Secondary | ICD-10-CM | POA: Insufficient documentation

## 2020-03-29 DIAGNOSIS — I132 Hypertensive heart and chronic kidney disease with heart failure and with stage 5 chronic kidney disease, or end stage renal disease: Secondary | ICD-10-CM | POA: Diagnosis not present

## 2020-03-29 DIAGNOSIS — Z992 Dependence on renal dialysis: Secondary | ICD-10-CM | POA: Insufficient documentation

## 2020-03-29 DIAGNOSIS — Z7982 Long term (current) use of aspirin: Secondary | ICD-10-CM | POA: Diagnosis not present

## 2020-03-29 DIAGNOSIS — E113399 Type 2 diabetes mellitus with moderate nonproliferative diabetic retinopathy without macular edema, unspecified eye: Secondary | ICD-10-CM | POA: Insufficient documentation

## 2020-03-29 DIAGNOSIS — I5042 Chronic combined systolic (congestive) and diastolic (congestive) heart failure: Secondary | ICD-10-CM | POA: Diagnosis not present

## 2020-03-29 DIAGNOSIS — N186 End stage renal disease: Secondary | ICD-10-CM | POA: Insufficient documentation

## 2020-03-29 DIAGNOSIS — Z008 Encounter for other general examination: Secondary | ICD-10-CM

## 2020-03-29 DIAGNOSIS — T82838A Hemorrhage of vascular prosthetic devices, implants and grafts, initial encounter: Secondary | ICD-10-CM | POA: Insufficient documentation

## 2020-03-29 LAB — COMPREHENSIVE METABOLIC PANEL
ALT: 9 U/L (ref 0–44)
AST: 17 U/L (ref 15–41)
Albumin: 2.7 g/dL — ABNORMAL LOW (ref 3.5–5.0)
Alkaline Phosphatase: 100 U/L (ref 38–126)
Anion gap: 10 (ref 5–15)
BUN: 14 mg/dL (ref 6–20)
CO2: 24 mmol/L (ref 22–32)
Calcium: 7.8 mg/dL — ABNORMAL LOW (ref 8.9–10.3)
Chloride: 103 mmol/L (ref 98–111)
Creatinine, Ser: 2.72 mg/dL — ABNORMAL HIGH (ref 0.61–1.24)
GFR, Estimated: 26 mL/min — ABNORMAL LOW (ref >60)
Glucose, Bld: 222 mg/dL — ABNORMAL HIGH (ref 70–99)
Potassium: 4.4 mmol/L (ref 3.5–5.1)
Sodium: 137 mmol/L (ref 135–145)
Total Bilirubin: 0.9 mg/dL (ref 0.3–1.2)
Total Protein: 7.6 g/dL (ref 6.5–8.1)

## 2020-03-29 LAB — CBC WITH DIFFERENTIAL/PLATELET
Abs Immature Granulocytes: 0.04 10*3/uL (ref 0.00–0.07)
Basophils Absolute: 0 10*3/uL (ref 0.0–0.1)
Basophils Relative: 0 %
Eosinophils Absolute: 0.1 10*3/uL (ref 0.0–0.5)
Eosinophils Relative: 2 %
HCT: 31.5 % — ABNORMAL LOW (ref 39.0–52.0)
Hemoglobin: 9.3 g/dL — ABNORMAL LOW (ref 13.0–17.0)
Immature Granulocytes: 1 %
Lymphocytes Relative: 19 %
Lymphs Abs: 1.5 10*3/uL (ref 0.7–4.0)
MCH: 27 pg (ref 26.0–34.0)
MCHC: 29.5 g/dL — ABNORMAL LOW (ref 30.0–36.0)
MCV: 91.3 fL (ref 80.0–100.0)
Monocytes Absolute: 0.5 10*3/uL (ref 0.1–1.0)
Monocytes Relative: 6 %
Neutro Abs: 5.7 10*3/uL (ref 1.7–7.7)
Neutrophils Relative %: 72 %
Platelets: 347 10*3/uL (ref 150–400)
RBC: 3.45 MIL/uL — ABNORMAL LOW (ref 4.22–5.81)
RDW: 17.5 % — ABNORMAL HIGH (ref 11.5–15.5)
WBC: 7.9 10*3/uL (ref 4.0–10.5)
nRBC: 0 % (ref 0.0–0.2)

## 2020-03-29 LAB — LACTIC ACID, PLASMA: Lactic Acid, Venous: 1.6 mmol/L (ref 0.5–1.9)

## 2020-03-29 NOTE — ED Triage Notes (Signed)
Pt st's he was sent from dialysis center ref. MRSA in his dialysis cath.  Pt c/o pain around cath in right upper chest.  Pt st's he finished his dialysis treatment before coming to ED

## 2020-03-30 NOTE — ED Notes (Signed)
Pt refused vitals, "catch me next time"
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
## 2020-03-30 NOTE — Discharge Instructions (Signed)
A culture was sent of your blood today to determine if there is any bacterial growth. If the results of the culture are positive and you require an antibiotic or a change of your prescribed antibiotic you will be contacted by the hospital. If the results are negative you will not be contacted.  Please follow up with your primary care provider within 5-7 days for re-evaluation of your symptoms. If you do not have a primary care provider, information for a healthcare clinic has been provided for you to make arrangements for follow up care. Please return to the emergency department for any new or worsening symptoms.

## 2020-03-30 NOTE — ED Notes (Signed)
Phlebotomy attempted to draw blood cultures, first attempt unsuccessful. Pt refused second attempt.

## 2020-04-03 LAB — CULTURE, BLOOD (ROUTINE X 2)
Culture: NO GROWTH
Special Requests: ADEQUATE

## 2020-04-04 ENCOUNTER — Other Ambulatory Visit: Payer: Self-pay

## 2020-04-04 ENCOUNTER — Other Ambulatory Visit
Admission: RE | Admit: 2020-04-04 | Discharge: 2020-04-04 | Disposition: A | Discharge status: Home / Self Care | Payer: Medicaid Other | Source: Ambulatory Visit | Attending: Vascular Surgery | Admitting: Vascular Surgery

## 2020-04-04 ENCOUNTER — Encounter (HOSPITAL_COMMUNITY): Payer: Self-pay | Admitting: Vascular Surgery

## 2020-04-04 DIAGNOSIS — Z01812 Encounter for preprocedural laboratory examination: Secondary | ICD-10-CM | POA: Insufficient documentation

## 2020-04-04 DIAGNOSIS — Z20822 Contact with and (suspected) exposure to covid-19: Secondary | ICD-10-CM | POA: Diagnosis not present

## 2020-04-04 LAB — SARS CORONAVIRUS 2 (TAT 6-24 HRS): SARS Coronavirus 2: NEGATIVE

## 2020-04-04 MED ORDER — DEXTROSE 5 % IV SOLN
3.0000 g | INTRAVENOUS | Status: AC
  Administered 2020-04-05: 10:00:00 2 g via INTRAVENOUS
  Filled 2020-04-04 (×2): qty 3000

## 2020-04-04 NOTE — Anesthesia Preprocedure Evaluation (Addendum)
Anesthesia Evaluation  Patient identified by MRN, date of birth, ID band Patient awake    Reviewed: Allergy & Precautions, NPO status , Patient's Chart, lab work & pertinent test results  History of Anesthesia Complications Negative for: history of anesthetic complications  Airway Mallampati: II  TM Distance: >3 FB Neck ROM: Full    Dental  (+) Dental Advisory Given, Chipped, Missing, Poor Dentition   Pulmonary Current SmokerPatient did not abstain from smoking.,    Pulmonary exam normal        Cardiovascular hypertension, + Past MI and +CHF  Normal cardiovascular exam   '21 TTE - EF 50 to 55%. LV was mildly dilated. There is mild left ventricular hypertrophy.  Mild aortic valve sclerosis is present, with no evidence of aortic valve stenosis.     Neuro/Psych negative neurological ROS  negative psych ROS   GI/Hepatic negative GI ROS, (+)     substance abuse  cocaine use,   Endo/Other  diabetes, Type 2, Insulin Dependent Obesity   Renal/GU ESRFRenal disease     Musculoskeletal negative musculoskeletal ROS (+)   Abdominal   Peds  Hematology  (+) anemia ,   Anesthesia Other Findings Covid test IP See PAT note   Reproductive/Obstetrics                           Anesthesia Physical Anesthesia Plan  ASA: IV  Anesthesia Plan: General   Post-op Pain Management:    Induction: Intravenous  PONV Risk Score and Plan: 2 and Treatment may vary due to age or medical condition, Ondansetron and Midazolam  Airway Management Planned: LMA  Additional Equipment: None  Intra-op Plan:   Post-operative Plan: Extubation in OR  Informed Consent: I have reviewed the patients History and Physical, chart, labs and discussed the procedure including the risks, benefits and alternatives for the proposed anesthesia with the patient or authorized representative who has indicated his/her understanding and  acceptance.     Dental advisory given  Plan Discussed with: CRNA and Anesthesiologist  Anesthesia Plan Comments:       Anesthesia Quick Evaluation

## 2020-04-04 NOTE — Progress Notes (Signed)
Anesthesia Chart Review: SAME DAY WORK-UP   Case: [id number] Date/Time: 04/05/20 0915   Procedures:      INSERTION OF LEFT ARM ARTERIOVENOUS (AV) GORE-TEX GRAFT (Left )     POSSIBLE INSERTION OF TUNNEL  DIALYSIS CATHETER (N/A )   Anesthesia type: Choice   Pre-op diagnosis: ESRD   [facility] / [facility]   Surgeons: Rosetta Posner, MD      DISCUSSION: Patient is a 59 year old male scheduled for the above procedure. S/p second stage basilic vein transposition 01/27/20. Exchange of right IJ tunneled dialysis catheter 03/03/20 but has had subsequent ED visits for catheter malfunction and possible line infection (blood cultures negative x 5 days 03/29/20). Now presents for the above surgery.  HD on TTS.   History includes smoking, DM2, CHF, peripheral edema, PVD (s/p right TMA 04/29/19; s/p right BKA 10/21/19), ESRD (started 11/06/19; TTS).+ UDS cocaine 10/18/19. +COVID-19 01/2019.  - Hospitalized 10/17/19-12/11/19 for syncope, AKI, right foot wound with exposed bone. Syncope felt likely mulitfactorial from overdiuresis, AKA, acute infection. Echo in 08/2019 showed EF 50-55% with no regional wall abnormalities, no AS noted. Ortho consulted, and s/p right BKA 10/21/19. Nephrology consulted for acute on chronic kidney disease. He was reluctant to consider hemodialysis. Palliative Care also consulted to discuss goals of care. Psychiatry consulted for behavioral issues, and at that time his sisters [name] and [name] needed to assist with medical decision. He ultimately had right IJ TDC placement by IR on 11/05/2019 and started hemodialysis 11/06/2019. S/p creation of left brachiobasilic AV fistula, first stage on 11/13/2019. Difficult disposition in regards to discharge due to homelessness, substance use, prior felony conviction on parole.  - Admission 09/23/19-10/01/19 to [facility] for acute on chronic kidney failure, anasarca. - Admission 04/03/19-04/09/19 to [facility] with anasarca, CHF exacerbation, CKD.  04/04/2020  presurgical COVID-19 test in process.  He is a same-day work-up.  Anesthesia team to evaluate on the day of surgery.   VS:  BP Readings from Last 3 Encounters:  03/30/20 (!) 147/86  03/16/20 (!) 172/95  03/15/20 (!) 171/109   Pulse Readings from Last 3 Encounters:  03/30/20 95  03/16/20 81  03/15/20 84    PROVIDERS: - He received primary care through [facility] - Nephrologist is with [facility]. According to 03/15/20 note by Carney Corners, AGNP, he has been declined for transplant evaluation until he can complete 6 months of substance abuse counseling with negative toxicology screens.    LABS: For ISTAT on arrival. Latest lab results include: Lab Results  Component Value Date   WBC 7.9 03/29/2020   HGB 9.3 (L) 03/29/2020   HCT 31.5 (L) 03/29/2020   PLT 347 03/29/2020   GLUCOSE 222 (H) 03/29/2020   CHOL 115 08/16/2019   TRIG 119 08/16/2019   HDL 41 08/16/2019   LDLCALC 50 08/16/2019   ALT 9 03/29/2020   AST 17 03/29/2020   NA 137 03/29/2020   K 4.4 03/29/2020   CL 103 03/29/2020   CREATININE 2.72 (H) 03/29/2020   BUN 14 03/29/2020   CO2 24 03/29/2020   INR 1.0 11/05/2019   HGBA1C 8.8 (H) 10/17/2019     IMAGES: CXR 03/29/20: FINDINGS: Right dialysis catheter remains in place, unchanged. Heart is normal size. Diffuse interstitial prominence throughout the lungs. No effusions. No acute bony abnormality. IMPRESSION: Diffuse interstitial prominence may reflect interstitial edema.   EKG: 03/16/20: Sinus rhythm Borderline prolonged PR interval Borderline left axis deviation Borderline prolonged QT interval   CV:  Echo 08/16/19: 1. Extremely limited; definity used; low normal LV systolic function;  mild LVH; mld LVE.  2. Left ventricular ejection fraction, by estimation, is 50 to 55%. The  left ventricle has low normal function. The left ventricle has no regional  wall motion abnormalities. The left ventricular internal cavity size was  mildly dilated.  There is mild  left ventricular hypertrophy. Left ventricular diastolic parameters are  indeterminate.  3. Right ventricular systolic function was not well visualized. The right  ventricular size is not well visualized.  4. The mitral valve is normal in structure. No evidence of mitral valve  regurgitation. No evidence of mitral stenosis.  5. The aortic valve is tricuspid. Aortic valve regurgitation is not  visualized. Mild aortic valve sclerosis is present, with no evidence of  aortic valve stenosis.  (Comparison: 06/22/19 LVEF 45-50% [facility], 35-40% 01/22/19 [facility], > 55% 04/02/18 [facility])   Past Medical History:  Diagnosis Date  . CHF (congestive heart failure) ([place])   . Diabetes mellitus without complication ([place])   . Peripheral edema   . Renal disorder    kidney disease    Past Surgical History:  Procedure Laterality Date  . AMPUTATION Right 10/21/2019   Procedure: RIGHT BELOW KNEE AMPUTATION;  Surgeon: Newt Minion, MD;  Location: [place];  Service: Orthopedics;  Laterality: Right;  . AV FISTULA PLACEMENT Left 11/13/2019   Procedure: LEFT ARTERIOVENOUS (AV) FISTULA VERSUS ARTERIOVENOUS GRAFT;  Surgeon: Rosetta Posner, MD;  Location: [place];  Service: Vascular;  Laterality: Left;  . BASCILIC VEIN TRANSPOSITION Left 01/27/2020   Procedure: LEFT ARM 2ND STAGE BASCILIC VEIN TRANSPOSITION;  Surgeon: Rosetta Posner, MD;  Location: [place];  Service: Vascular;  Laterality: Left;  . DIALYSIS/PERMA CATHETER INSERTION N/A 03/03/2020   Procedure: DIALYSIS/PERMA CATHETER INSERTION;  Surgeon: Algernon Huxley, MD;  Location: [place] CV LAB;  Service: Cardiovascular;  Laterality: N/A;  . IR FLUORO GUIDE CV LINE RIGHT  11/05/2019  . IR US GUIDE VASC ACCESS RIGHT  11/05/2019  . TOE AMPUTATION Bilateral    due to osteomyelitis, all 5 each foot    MEDICATIONS: No current facility-administered medications for this encounter.   Marland Kitchen aspirin EC 81 MG EC tablet  . ergocalciferol (VITAMIN D2) 1.25 MG (50000 UT)  capsule  . insulin aspart (NOVOLOG) 100 UNIT/ML injection  . insulin glargine (LANTUS) 100 UNIT/ML injection  . midodrine (PROAMATINE) 10 MG tablet  . albuterol (VENTOLIN HFA) 108 (90 Base) MCG/ACT inhaler  . diclofenac Sodium (VOLTAREN) 1 % GEL  . Insulin Pen Needle (PEN NEEDLES 31GX5/16") 31G X 8 MM MISC  . lidocaine (LIDODERM) 5 %  . pregabalin (LYRICA) 50 MG capsule     Myra Gianotti, PA-C Surgical Short Stay/Anesthesiology [facility] Phone [phone] [facility] Phone [phone] 04/04/2020 4:01 PM

## 2020-04-04 NOTE — Progress Notes (Signed)
Did not review personal info or history as pt stated he has done this many times before and knows the information.

## 2020-04-05 ENCOUNTER — Ambulatory Visit (HOSPITAL_COMMUNITY): Payer: Medicaid Other | Admitting: Vascular Surgery

## 2020-04-05 ENCOUNTER — Ambulatory Visit (HOSPITAL_COMMUNITY): Payer: Medicaid Other

## 2020-04-05 ENCOUNTER — Encounter (HOSPITAL_COMMUNITY): Payer: Self-pay | Admitting: Vascular Surgery

## 2020-04-05 ENCOUNTER — Encounter (HOSPITAL_COMMUNITY)
Admission: RE | Disposition: A | Discharge status: Home / Self Care | Payer: Self-pay | Source: Home / Self Care | Attending: Vascular Surgery

## 2020-04-05 ENCOUNTER — Ambulatory Visit (HOSPITAL_COMMUNITY)
Admission: RE | Admit: 2020-04-05 | Discharge: 2020-04-05 | Disposition: A | Discharge status: Home / Self Care | Payer: Medicaid Other | Attending: Vascular Surgery | Admitting: Vascular Surgery

## 2020-04-05 DIAGNOSIS — I132 Hypertensive heart and chronic kidney disease with heart failure and with stage 5 chronic kidney disease, or end stage renal disease: Secondary | ICD-10-CM | POA: Insufficient documentation

## 2020-04-05 DIAGNOSIS — I358 Other nonrheumatic aortic valve disorders: Secondary | ICD-10-CM | POA: Diagnosis not present

## 2020-04-05 DIAGNOSIS — Z992 Dependence on renal dialysis: Secondary | ICD-10-CM | POA: Diagnosis not present

## 2020-04-05 DIAGNOSIS — Z7982 Long term (current) use of aspirin: Secondary | ICD-10-CM | POA: Insufficient documentation

## 2020-04-05 DIAGNOSIS — I509 Heart failure, unspecified: Secondary | ICD-10-CM | POA: Insufficient documentation

## 2020-04-05 DIAGNOSIS — D631 Anemia in chronic kidney disease: Secondary | ICD-10-CM | POA: Diagnosis not present

## 2020-04-05 DIAGNOSIS — T82868A Thrombosis of vascular prosthetic devices, implants and grafts, initial encounter: Secondary | ICD-10-CM | POA: Insufficient documentation

## 2020-04-05 DIAGNOSIS — Z791 Long term (current) use of non-steroidal anti-inflammatories (NSAID): Secondary | ICD-10-CM | POA: Diagnosis not present

## 2020-04-05 DIAGNOSIS — I252 Old myocardial infarction: Secondary | ICD-10-CM | POA: Diagnosis not present

## 2020-04-05 DIAGNOSIS — X58XXXA Exposure to other specified factors, initial encounter: Secondary | ICD-10-CM | POA: Insufficient documentation

## 2020-04-05 DIAGNOSIS — N186 End stage renal disease: Secondary | ICD-10-CM | POA: Insufficient documentation

## 2020-04-05 DIAGNOSIS — E1122 Type 2 diabetes mellitus with diabetic chronic kidney disease: Secondary | ICD-10-CM | POA: Insufficient documentation

## 2020-04-05 DIAGNOSIS — Z794 Long term (current) use of insulin: Secondary | ICD-10-CM | POA: Insufficient documentation

## 2020-04-05 DIAGNOSIS — Z79899 Other long term (current) drug therapy: Secondary | ICD-10-CM | POA: Diagnosis not present

## 2020-04-05 DIAGNOSIS — Z419 Encounter for procedure for purposes other than remedying health state, unspecified: Secondary | ICD-10-CM

## 2020-04-05 DIAGNOSIS — N185 Chronic kidney disease, stage 5: Secondary | ICD-10-CM

## 2020-04-05 DIAGNOSIS — Z89511 Acquired absence of right leg below knee: Secondary | ICD-10-CM | POA: Diagnosis not present

## 2020-04-05 HISTORY — PX: REMOVAL OF A DIALYSIS CATHETER: SHX6053

## 2020-04-05 HISTORY — PX: INSERTION OF DIALYSIS CATHETER: SHX1324

## 2020-04-05 HISTORY — PX: AV FISTULA PLACEMENT: SHX1204

## 2020-04-05 LAB — POCT I-STAT, CHEM 8
BUN: 30 mg/dL — ABNORMAL HIGH (ref 6–20)
Calcium, Ion: 1.07 mmol/L — ABNORMAL LOW (ref 1.15–1.40)
Chloride: 104 mmol/L (ref 98–111)
Creatinine, Ser: 4.2 mg/dL — ABNORMAL HIGH (ref 0.61–1.24)
Glucose, Bld: 184 mg/dL — ABNORMAL HIGH (ref 70–99)
HCT: 31 % — ABNORMAL LOW (ref 39.0–52.0)
Hemoglobin: 10.5 g/dL — ABNORMAL LOW (ref 13.0–17.0)
Potassium: 4.1 mmol/L (ref 3.5–5.1)
Sodium: 138 mmol/L (ref 135–145)
TCO2: 22 mmol/L (ref 22–32)

## 2020-04-05 LAB — GLUCOSE, CAPILLARY
Glucose-Capillary: 199 mg/dL — ABNORMAL HIGH (ref 70–99)
Glucose-Capillary: 147 mg/dL — ABNORMAL HIGH (ref 70–99)

## 2020-04-05 SURGERY — INSERTION OF ARTERIOVENOUS (AV) GORE-TEX GRAFT ARM
Anesthesia: General | Site: Chest | Laterality: Right

## 2020-04-05 MED ORDER — LIDOCAINE-EPINEPHRINE 0.5 %-1:200000 IJ SOLN
INTRAMUSCULAR | Status: AC
  Filled 2020-04-05: qty 1

## 2020-04-05 MED ORDER — CHLORHEXIDINE GLUCONATE 4 % EX LIQD: 60.0000 mL | Freq: Once | CUTANEOUS | Status: DC

## 2020-04-05 MED ORDER — ONDANSETRON HCL 4 MG/2ML IJ SOLN: 4.0000 mg | Freq: Once | INTRAMUSCULAR | Status: DC | PRN

## 2020-04-05 MED ORDER — MIDAZOLAM HCL 5 MG/5ML IJ SOLN
INTRAMUSCULAR | Status: DC | PRN
  Administered 2020-04-05: 1 mg via INTRAVENOUS

## 2020-04-05 MED ORDER — MIDAZOLAM HCL 2 MG/2ML IJ SOLN
INTRAMUSCULAR | Status: AC
  Filled 2020-04-05: qty 2

## 2020-04-05 MED ORDER — 0.9 % SODIUM CHLORIDE (POUR BTL) OPTIME
TOPICAL | Status: DC | PRN
  Administered 2020-04-05: 10:00:00 1000 mL

## 2020-04-05 MED ORDER — SODIUM CHLORIDE 0.9 % IV SOLN
INTRAVENOUS | Status: DC | PRN
  Administered 2020-04-05: 10:00:00 500 mL

## 2020-04-05 MED ORDER — HEPARIN SODIUM (PORCINE) 1000 UNIT/ML IJ SOLN
INTRAMUSCULAR | Status: AC
  Filled 2020-04-05: qty 1

## 2020-04-05 MED ORDER — SODIUM CHLORIDE 0.9 % IV SOLN: INTRAVENOUS | Status: DC

## 2020-04-05 MED ORDER — FENTANYL CITRATE (PF) 100 MCG/2ML IJ SOLN
INTRAMUSCULAR | Status: AC
  Filled 2020-04-05: qty 2

## 2020-04-05 MED ORDER — LACTATED RINGERS IV SOLN: INTRAVENOUS | Status: DC

## 2020-04-05 MED ORDER — FENTANYL CITRATE (PF) 250 MCG/5ML IJ SOLN
INTRAMUSCULAR | Status: DC | PRN
  Administered 2020-04-05 (×2): 50 ug via INTRAVENOUS

## 2020-04-05 MED ORDER — PROPOFOL 10 MG/ML IV BOLUS
INTRAVENOUS | Status: DC | PRN
  Administered 2020-04-05: 130 mg via INTRAVENOUS

## 2020-04-05 MED ORDER — HYDROCODONE-ACETAMINOPHEN 5-325 MG PO TABS: 1.0000 | ORAL_TABLET | Freq: Four times a day (QID) | ORAL | 0 refills | Status: DC | PRN

## 2020-04-05 MED ORDER — FENTANYL CITRATE (PF) 250 MCG/5ML IJ SOLN
INTRAMUSCULAR | Status: AC
  Filled 2020-04-05: qty 5

## 2020-04-05 MED ORDER — FENTANYL CITRATE (PF) 100 MCG/2ML IJ SOLN
25.0000 ug | INTRAMUSCULAR | Status: DC | PRN
  Administered 2020-04-05: 25 ug via INTRAVENOUS

## 2020-04-05 MED ORDER — IODIXANOL 320 MG/ML IV SOLN
INTRAVENOUS | Status: DC | PRN
  Administered 2020-04-05: 10:00:00 4 mL via INTRAVENOUS

## 2020-04-05 MED ORDER — PHENYLEPHRINE HCL-NACL 10-0.9 MG/250ML-% IV SOLN
INTRAVENOUS | Status: DC | PRN
  Administered 2020-04-05 (×2): 50 ug/min via INTRAVENOUS

## 2020-04-05 MED ORDER — HEPARIN SODIUM (PORCINE) 1000 UNIT/ML IJ SOLN
INTRAMUSCULAR | Status: DC | PRN
  Administered 2020-04-05: 2100 [IU]

## 2020-04-05 MED ORDER — LIDOCAINE HCL (CARDIAC) PF 100 MG/5ML IV SOSY
PREFILLED_SYRINGE | INTRAVENOUS | Status: DC | PRN
  Administered 2020-04-05: 60 mg via INTRAVENOUS

## 2020-04-05 MED ORDER — ALBUTEROL SULFATE (2.5 MG/3ML) 0.083% IN NEBU
2.5000 mg | INHALATION_SOLUTION | Freq: Once | RESPIRATORY_TRACT | Status: AC
  Administered 2020-04-05: 12:00:00 2.5 mg via RESPIRATORY_TRACT

## 2020-04-05 MED ORDER — ONDANSETRON HCL 4 MG/2ML IJ SOLN
INTRAMUSCULAR | Status: AC
  Filled 2020-04-05: qty 2

## 2020-04-05 MED ORDER — ALBUTEROL SULFATE (2.5 MG/3ML) 0.083% IN NEBU
INHALATION_SOLUTION | RESPIRATORY_TRACT | Status: AC
  Filled 2020-04-05: qty 3

## 2020-04-05 MED ORDER — CHLORHEXIDINE GLUCONATE 0.12 % MT SOLN
15.0000 mL | Freq: Once | OROMUCOSAL | Status: AC
  Administered 2020-04-05: 08:00:00 15 mL via OROMUCOSAL
  Filled 2020-04-05: qty 15

## 2020-04-05 MED ORDER — ONDANSETRON HCL 4 MG/2ML IJ SOLN
INTRAMUSCULAR | Status: DC | PRN
  Administered 2020-04-05: 4 mg via INTRAVENOUS

## 2020-04-05 MED ORDER — PROPOFOL 10 MG/ML IV BOLUS
INTRAVENOUS | Status: AC
  Filled 2020-04-05: qty 40

## 2020-04-05 MED ORDER — ORAL CARE MOUTH RINSE: 15.0000 mL | Freq: Once | OROMUCOSAL | Status: AC

## 2020-04-05 MED ORDER — SODIUM CHLORIDE 0.9 % IV SOLN
INTRAVENOUS | Status: AC
  Filled 2020-04-05: qty 1.2

## 2020-04-05 SURGICAL SUPPLY — 63 items
ARMBAND PINK RESTRICT EXTREMIT (MISCELLANEOUS) ×10 IMPLANT
BAG DECANTER FOR FLEXI CONT (MISCELLANEOUS) ×5 IMPLANT
BIOPATCH BLUE 3/4IN DISK W/1.5 (GAUZE/BANDAGES/DRESSINGS) ×5 IMPLANT
BIOPATCH RED 1 DISK 7.0 (GAUZE/BANDAGES/DRESSINGS) ×4 IMPLANT
BIOPATCH RED 1IN DISK 7.0MM (GAUZE/BANDAGES/DRESSINGS) ×1
CANISTER SUCT 3000ML PPV (MISCELLANEOUS) ×5 IMPLANT
CANNULA VESSEL 3MM 2 BLNT TIP (CANNULA) ×5 IMPLANT
CATH PALINDROME-P 19CM W/VT (CATHETERS) IMPLANT
CATH PALINDROME-P 23CM W/VT (CATHETERS) IMPLANT
CATH PALINDROME-P 28CM W/VT (CATHETERS) ×5 IMPLANT
CATH SOFT-VU 4F 65 STRAIGHT (CATHETERS) ×3 IMPLANT
CATH SOFT-VU STRAIGHT 4F 65CM (CATHETERS) ×2
CLIP LIGATING EXTRA MED SLVR (CLIP) ×5 IMPLANT
CLIP LIGATING EXTRA SM BLUE (MISCELLANEOUS) ×5 IMPLANT
COVER PROBE W GEL 5X96 (DRAPES) ×10 IMPLANT
COVER SURGICAL LIGHT HANDLE (MISCELLANEOUS) ×5 IMPLANT
COVER WAND RF STERILE (DRAPES) IMPLANT
DECANTER SPIKE VIAL GLASS SM (MISCELLANEOUS) ×5 IMPLANT
DERMABOND ADVANCED (GAUZE/BANDAGES/DRESSINGS) ×4
DERMABOND ADVANCED .7 DNX12 (GAUZE/BANDAGES/DRESSINGS) ×6 IMPLANT
DRAPE C-ARM 42X72 X-RAY (DRAPES) ×5 IMPLANT
DRAPE CHEST BREAST 15X10 FENES (DRAPES) ×5 IMPLANT
DRSG COVADERM 4X6 (GAUZE/BANDAGES/DRESSINGS) ×10 IMPLANT
ELECT REM PT RETURN 9FT ADLT (ELECTROSURGICAL) ×5
ELECTRODE REM PT RTRN 9FT ADLT (ELECTROSURGICAL) ×3 IMPLANT
GAUZE 4X4 16PLY RFD (DISPOSABLE) ×5 IMPLANT
GAUZE SPONGE 4X4 12PLY STRL (GAUZE/BANDAGES/DRESSINGS) ×5 IMPLANT
GLOVE SS BIOGEL STRL SZ 7.5 (GLOVE) ×6 IMPLANT
GLOVE SUPERSENSE BIOGEL SZ 7.5 (GLOVE) ×4
GOWN STRL REUS W/ TWL LRG LVL3 (GOWN DISPOSABLE) ×9 IMPLANT
GOWN STRL REUS W/TWL LRG LVL3 (GOWN DISPOSABLE) ×6
GRAFT GORETEX STRT 4-7X45 (Vascular Products) ×5 IMPLANT
GUIDEWIRE ANGLED .035X150CM (WIRE) ×5 IMPLANT
KIT BASIN OR (CUSTOM PROCEDURE TRAY) ×5 IMPLANT
KIT PALINDROME-P 55CM (CATHETERS) IMPLANT
KIT TURNOVER KIT B (KITS) ×5 IMPLANT
NEEDLE 18GX1X1/2 (RX/OR ONLY) (NEEDLE) ×5 IMPLANT
NEEDLE 22X1 1/2 (OR ONLY) (NEEDLE) IMPLANT
NEEDLE HYPO 25GX1X1/2 BEV (NEEDLE) ×5 IMPLANT
NS IRRIG 1000ML POUR BTL (IV SOLUTION) ×5 IMPLANT
PACK CV ACCESS (CUSTOM PROCEDURE TRAY) ×5 IMPLANT
PACK SURGICAL SETUP 50X90 (CUSTOM PROCEDURE TRAY) IMPLANT
PAD ARMBOARD 7.5X6 YLW CONV (MISCELLANEOUS) ×10 IMPLANT
SOAP 2 % CHG 4 OZ (WOUND CARE) ×5 IMPLANT
SUT ETHILON 3 0 PS 1 (SUTURE) ×5 IMPLANT
SUT PROLENE 5 0 C 1 24 (SUTURE) ×5 IMPLANT
SUT PROLENE 6 0 C 1 24 (SUTURE) ×5 IMPLANT
SUT PROLENE 6 0 CC (SUTURE) ×15 IMPLANT
SUT SILK 2 0 PERMA HAND 18 BK (SUTURE) IMPLANT
SUT SILK 2 0 SH (SUTURE) ×5 IMPLANT
SUT VIC AB 3-0 SH 27 (SUTURE) ×4
SUT VIC AB 3-0 SH 27X BRD (SUTURE) ×6 IMPLANT
SUT VIC AB 4-0 PS2 18 (SUTURE) ×5 IMPLANT
SUT VICRYL 4-0 PS2 18IN ABS (SUTURE) IMPLANT
SYR 10ML LL (SYRINGE) ×5 IMPLANT
SYR 20ML LL LF (SYRINGE) ×5 IMPLANT
SYR 5ML LL (SYRINGE) ×10 IMPLANT
SYR CONTROL 10ML LL (SYRINGE) ×5 IMPLANT
SYR TOOMEY 50ML (SYRINGE) IMPLANT
TOWEL GREEN STERILE (TOWEL DISPOSABLE) ×10 IMPLANT
TOWEL GREEN STERILE FF (TOWEL DISPOSABLE) ×5 IMPLANT
UNDERPAD 30X36 HEAVY ABSORB (UNDERPADS AND DIAPERS) ×5 IMPLANT
WATER STERILE IRR 1000ML POUR (IV SOLUTION) ×5 IMPLANT

## 2020-04-05 NOTE — Transfer of Care (Signed)
Immediate Anesthesia Transfer of Care Note  Patient: Phoenix Dresser  Procedure(s) Performed: INSERTION OF LEFT ARM ARTERIOVENOUS (AV) GORE-TEX GRAFT (Left ) INSERTION OF TUNNEL  DIALYSIS CATHETER LEFT INTERNAL JUGULAR (N/A ) REMOVAL OF RIGHT CHEST DIALYSIS CATHETER (Right Chest)  Patient Location: PACU  Anesthesia Type:General  Level of Consciousness: awake, alert  and oriented  Airway & Oxygen Therapy: Patient Spontanous Breathing and Patient connected to nasal cannula oxygen  Post-op Assessment: Report given to RN, Post -op Vital signs reviewed and stable and Patient moving all extremities X 4  Post vital signs: Reviewed and stable  Last Vitals:  Vitals Value Taken Time  BP 100/71 04/05/20 1213  Temp    Pulse 78 04/05/20 1216  Resp 21 04/05/20 1216  SpO2 95 % 04/05/20 1216  Vitals shown include unvalidated device data.  Last Pain:  Vitals:   04/05/20 0734  TempSrc:   PainSc: 0-No pain      Patients Stated Pain Goal: 3 (74/82/70 7867)  Complications: No complications documented.

## 2020-04-05 NOTE — Discharge Instructions (Signed)
° °  [facility] [place] ° °Discharge Instructions ° °AV Fistula or Graft Surgery for Dialysis Access ° °Please refer to the following instructions for your post-procedure care. Your surgeon or physician assistant will discuss any changes with you. ° °Activity ° °You may drive the day following your surgery, if you are comfortable and no longer taking prescription pain medication. Resume full activity as the soreness in your incision resolves. ° °Bathing/Showering ° °You may shower after you go home. Keep your incision dry for 48 hours. Do not soak in a bathtub, hot tub, or swim until the incision heals completely. You may not shower if you have a hemodialysis catheter. ° °Incision Care ° °Clean your incision with mild soap and water after 48 hours. Pat the area dry with a clean towel. You do not need a bandage unless otherwise instructed. Do not apply any ointments or creams to your incision. You may have skin glue on your incision. Do not peel it off. It will come off on its own in about one week. Your arm may swell a bit after surgery. To reduce swelling use pillows to elevate your arm so it is above your heart. Your doctor will tell you if you need to lightly wrap your arm with an ACE bandage. ° °Diet ° °Resume your normal diet. There are not special food restrictions following this procedure. In order to heal from your surgery, it is CRITICAL to get adequate nutrition. Your body requires vitamins, minerals, and protein. Vegetables are the best source of vitamins and minerals. Vegetables also provide the perfect balance of protein. Processed food has little nutritional value, so try to avoid this. ° °Medications ° °Resume taking all of your medications. If your incision is causing pain, you may take over-the counter pain relievers such as acetaminophen (Tylenol). If you were prescribed a stronger pain medication, please be aware these medications can cause nausea and constipation. Prevent  nausea by taking the medication with a snack or meal. Avoid constipation by drinking plenty of fluids and eating foods with high amount of fiber, such as fruits, vegetables, and grains. Do not take Tylenol if you are taking prescription pain medications. ° ° ° ° °Follow up °Your surgeon may want to see you in the office following your access surgery. If so, this will be arranged at the time of your surgery. ° °Please call us immediately for any of the following conditions: ° °Increased pain, redness, drainage (pus) from your incision site °Fever of 101 degrees or higher °Severe or worsening pain at your incision site °Hand pain or numbness. ° °Reduce your risk of vascular disease: ° °Stop smoking. If you would like help, call [facility] at 1-800-QUIT-NOW (1-800-784-8669) or [facility] at 336-586-4000 ° °Manage your cholesterol °Maintain a desired weight °Control your diabetes °Keep your blood pressure down ° °Dialysis ° °It will take several weeks to several months for your new dialysis access to be ready for use. Your surgeon will determine when it is OK to use it. Your nephrologist will continue to direct your dialysis. You can continue to use your Permcath until your new access is ready for use. ° °If you have any questions, please call the office at [phone]. ° °

## 2020-04-05 NOTE — Anesthesia Procedure Notes (Signed)
Procedure Name: LMA Insertion Date/Time: 04/05/2020 9:31 AM Performed by: Mariea Clonts, CRNA Pre-anesthesia Checklist: Patient identified, Emergency Drugs available, Suction available and Patient being monitored Patient Re-evaluated:Patient Re-evaluated prior to induction Oxygen Delivery Method: Circle System Utilized Preoxygenation: Pre-oxygenation with 100% oxygen Induction Type: IV induction Ventilation: Mask ventilation without difficulty LMA: LMA inserted LMA Size: 5.0 Number of attempts: 1 Airway Equipment and Method: Bite block Placement Confirmation: positive ETCO2 Tube secured with: Tape Dental Injury: Teeth and Oropharynx as per pre-operative assessment

## 2020-04-05 NOTE — Anesthesia Postprocedure Evaluation (Signed)
Anesthesia Post Note  Patient: Zachary Young  Procedure(s) Performed: INSERTION OF LEFT ARM ARTERIOVENOUS (AV) GORE-TEX GRAFT (Left ) INSERTION OF TUNNEL  DIALYSIS CATHETER LEFT INTERNAL JUGULAR (N/A ) REMOVAL OF RIGHT CHEST DIALYSIS CATHETER (Right Chest)     Patient location during evaluation: PACU Anesthesia Type: General Level of consciousness: awake and alert Pain management: pain level controlled Vital Signs Assessment: post-procedure vital signs reviewed and stable Respiratory status: spontaneous breathing, nonlabored ventilation and respiratory function stable Cardiovascular status: blood pressure returned to baseline and stable Postop Assessment: no apparent nausea or vomiting Anesthetic complications: no   No complications documented.  Last Vitals:  Vitals:   04/05/20 1245 04/05/20 1300  BP: 123/71 126/73  Pulse: 76 75  Resp: 13 16  Temp: 36.5 C   SpO2: 96% 93%    Last Pain:  Vitals:   04/05/20 1245  TempSrc:   PainSc: Zachary Young

## 2020-04-05 NOTE — Op Note (Signed)
OPERATIVE REPORT  DATE OF SURGERY: 04/05/2020  PATIENT: Zachary Young, 59 y.o. male MRN: [id number]  DOB: 11-04-1960  PRE-OPERATIVE DIAGNOSIS: End-stage renal disease  POST-OPERATIVE DIAGNOSIS:  Same  PROCEDURE: #1 left IJ tunneled hemodialysis catheter, #2 removal of right IJ tunneled hemodialysis catheter #3 left upper arm loop AV Gore-Tex graft  SURGEON:  Curt Jews, M.D.  PHYSICIAN ASSISTANT: Arlee Muslim, PA-C  The assistant was needed for exposure and to expedite the case  ANESTHESIA: General  EBL: per anesthesia record  Total I/O In: 600 [I.V.:600] Out: 25 [Blood:25]  BLOOD ADMINISTERED: none  DRAINS: none  SPECIMEN: none  COUNTS CORRECT:  YES  PATIENT DISPOSITION:  PACU - hemodynamically stable  PROCEDURE DETAILS: The patient was taken to room placed evaluation where the area of the left chest was prepped draped usual sterile fashion.  The patient had recent replacement of a right IJ tunnel catheter and continues to have poor functioning.  Decision was made to place a new catheter.  The patient was placed in a downward position and using SonoSite ultrasound the right internal jugular vein was accessed.  A J-wire would not pass down the jugular vein and therefore a Glidewire was chosen and this did pass down the level of the right atrium.  Dilator and peel-away sheath was passed over the guidewire.  The 28 cm catheter was brought through a separate stab incision and was placed down the peel-away sheath which was removed.  The tips were placed in the distal right atrium.  The 2 lm flushed and aspirated easily were locked 1000/cc heparin.  The catheter was secured to the skin with a 3-0 nylon stitch and the entry site was closed with a 4 subcuticular Vicryl stitch.  Attention was then turned to the catheter in the right neck.  Hemostat was used to free up the Dacron cuff and the catheter was removed in entirety and pressure was held for hemostasis.  Next the left  arm was prepped draped in usual sterile fashion.  Patient had a poor pulse at the level of the antecubital space.  The SonoSite was used to visualize and the patient had a very high bifurcation of his brachial artery.  This was just distal to the axilla.  For this reason decision was made to place a upper arm loop graft.  Patient had a prior failed basilic vein transposition fistula.  Incision was made through the prior scar in the axilla and the basilic vein at the axillary vein was identified and was patent and large.  The brachial artery was identified in the axilla as well and was also of large caliber.  A loop configuration tunnel was created in the upper arm and a 4 x 7 tapered graft was brought through the loop configuration tunnel.  The brachial artery was occluded proximally distally and was opened with 11 blade similarly pot scissors.  The graft was spatulated and sewn end-to-side to the artery with a running 6-0 Prolene suture.  Clamps removed and good anastomosis was encountered.  The graft was flushed with heparinized saline and reoccluded.  Next the basilic and brachial vein were ligated distally and was occluded proximally.  The vein was transected and spatulated and a 5 dilator passed easily through the vein into the axilla.  The graft was cut to the appropriate length and was spatulated and the 7 mm portion of the graft was sewn into into the vein with a running 6-0 Prolene suture.  Clamps removed and excellent thrill  was noted.  Wounds irrigated with saline.  Hemostasis electrocautery.  The wounds were closed with 3-0 Vicryl the subcutaneous septic tissue.  Sterile dressing was applied and patient was transferred to the recovery room in stable condition where chest x-rays pending   Rosetta Posner, M.D., [facility] 04/05/2020 1:54 PM

## 2020-04-05 NOTE — OR Nursing (Signed)
Pt is awake,alert and oriented. Pt is in NAD at this time. Pt and family verbalized understanding of poc and discharge instructions. instructions given to family and reviewed prior to discharge.   

## 2020-04-05 NOTE — H&P (Signed)
Office Visit  02/29/2020 [facility]ist-   Avonell Lenig, Arvilla Meres, MD   Vascular Surgery  ESRD on dialysis [facility])   Dx  New Patient (Initial Visit)   Reason for Visit    Additional Documentation  Vitals:  BP 198/97Important    Pulse 82   Temp 98.4 F (36.9 C) (Other (Comment))   Resp 18   Wt 134.3 kg    SpO2 98%   BMI 37.00 kg/m   BSA 2.67 m       More Vitals   Flowsheets:  MEWS Score,   Anthropometrics,   NEWS,   Vital Signs,   Method of Visit     Encounter Info:  Billing Info,   History,   Allergies,   Detailed Report      All Notes   Progress Notes by Rosetta Posner, MD at 02/29/2020 3:20 PM  Author: Rosetta Posner, MD Author Type: Physician Filed: 02/29/2020 3:28 PM  Note Status: Signed Cosign: Cosign Not Required Encounter Date: 02/29/2020  Editor: Rosetta Posner, MD (Physician)                                                   [facility] [place]  Patient name: Zachary Young          MRN: [id number]        DOB: 1960/09/08            Sex: male  REASON FOR VISIT: Follow-up hemodialysis access  HPI: Zachary Young is a 59 y.o. male here today for follow-up.  He underwent second stage basilic vein transposition fistula on 01/27/2020.  He continues to have hemodialysis via his right IJ catheter.  He reports that he has had some issues with clotting of his catheter.        Current Outpatient Medications  Medication Sig Dispense Refill   aspirin EC 81 MG EC tablet Take 1 tablet (81 mg total) by mouth daily. 30 tablet 0   cephALEXin (KEFLEX) 250 MG capsule Take 250 mg by mouth in the morning and at bedtime.     diclofenac Sodium (VOLTAREN) 1 % GEL Apply 1 inch to BKA stump to help with pain 4 g 1   ergocalciferol (VITAMIN D2) 1.25 MG (50000 UT) capsule Take 1 capsule (50,000 Units total) by mouth every Friday. 30 capsule 1   HYDROcodone-acetaminophen (NORCO/VICODIN) 5-325 MG  tablet Take 1 tablet by mouth every 6 (six) hours as needed for moderate pain. 20 tablet 0   insulin aspart (NOVOLOG) 100 UNIT/ML injection Inject 10 Units into the skin 3 (three) times daily with meals. (Patient taking differently: Inject 4 Units into the skin 3 (three) times daily with meals. ) 10 mL 11   insulin glargine (LANTUS) 100 UNIT/ML injection Inject 0.35 mLs (35 Units total) into the skin at bedtime. (Patient taking differently: Inject 10 Units into the skin at bedtime. ) 10 mL 11   Insulin Pen Needle (PEN NEEDLES 31GX5/16") 31G X 8 MM MISC 1 each by Other route daily. 90 each 2   lidocaine (LIDODERM) 5 % Place 1 patch onto the skin daily. Remove & Discard patch within 12 hours or as directed by MD 30 patch 0   midodrine (PROAMATINE) 10 MG tablet Take 1 tablet (10 mg total) by mouth 3 (three) times daily  with meals. 180 tablet 2   pregabalin (LYRICA) 50 MG capsule Take 1 capsule (50 mg total) by mouth 2 (two) times daily. 60 capsule 2   triamcinolone cream (KENALOG) 0.1 % Apply 1 application topically 2 (two) times daily.     albuterol (VENTOLIN HFA) 108 (90 Base) MCG/ACT inhaler Inhale 1 puff into the lungs every 4 (four) hours as needed for wheezing or shortness of breath. (Patient not taking: Reported on 01/22/2020) 6.7 g 0   No current facility-administered medications for this visit.     PHYSICAL EXAM:    Vitals:   02/29/20 1504  BP: (!) 198/97  Pulse: 82  Resp: 18  Temp: 98.4 F (36.9 C)  TempSrc: Other (Comment)  SpO2: 98%  Weight: 296 lb (134.3 kg)    GENERAL: The patient is a well-nourished male, in no acute distress. The vital signs are documented above. On physical exam I do not feel a thrill in his fistula.  I imaged his fistula with SonoSite ultrasound.  There does not appear to be flow in the basilic vein fistula.  MEDICAL ISSUES: Failed basilic vein transposition fistula in his left arm.  Discussed this with the patient.  Explained that his  next option would be left arm Gore-Tex graft.  Explained the limitations of this with potential thromboses.  Will coordinate this as an outpatient on a nondialysis day at [place], MD [facility] Tel [phone]        Addendum:  The patient has been re-examined and re-evaluated.  The patient's history and physical has been reviewed and is unchanged.    Zachary Young is a 59 y.o. male is being admitted with ESRD. All the risks, benefits and other treatment options have been discussed with the patient. The patient has consented to proceed with Procedure(s): INSERTION OF LEFT ARM ARTERIOVENOUS (AV) GORE-TEX GRAFT POSSIBLE INSERTION OF TUNNEL  DIALYSIS CATHETER as a surgical intervention.  Zachary Young 04/05/2020 7:20 AM Vascular and Vein Surgery

## 2020-04-06 ENCOUNTER — Encounter (HOSPITAL_COMMUNITY): Payer: Self-pay | Admitting: Vascular Surgery

## 2020-04-14 ENCOUNTER — Encounter (HOSPITAL_COMMUNITY): Payer: Self-pay | Admitting: Emergency Medicine

## 2020-04-14 ENCOUNTER — Emergency Department (HOSPITAL_COMMUNITY): Payer: Medicaid Other

## 2020-04-14 ENCOUNTER — Emergency Department (HOSPITAL_COMMUNITY)
Admission: EM | Admit: 2020-04-14 | Discharge: 2020-04-15 | Disposition: A | Discharge status: Home / Self Care | Payer: Medicaid Other | Attending: Emergency Medicine | Admitting: Emergency Medicine

## 2020-04-14 DIAGNOSIS — E1159 Type 2 diabetes mellitus with other circulatory complications: Secondary | ICD-10-CM | POA: Insufficient documentation

## 2020-04-14 DIAGNOSIS — I5042 Chronic combined systolic (congestive) and diastolic (congestive) heart failure: Secondary | ICD-10-CM | POA: Diagnosis not present

## 2020-04-14 DIAGNOSIS — E1140 Type 2 diabetes mellitus with diabetic neuropathy, unspecified: Secondary | ICD-10-CM | POA: Diagnosis not present

## 2020-04-14 DIAGNOSIS — I132 Hypertensive heart and chronic kidney disease with heart failure and with stage 5 chronic kidney disease, or end stage renal disease: Secondary | ICD-10-CM | POA: Diagnosis not present

## 2020-04-14 DIAGNOSIS — N185 Chronic kidney disease, stage 5: Secondary | ICD-10-CM | POA: Insufficient documentation

## 2020-04-14 DIAGNOSIS — F17210 Nicotine dependence, cigarettes, uncomplicated: Secondary | ICD-10-CM | POA: Insufficient documentation

## 2020-04-14 DIAGNOSIS — Z7982 Long term (current) use of aspirin: Secondary | ICD-10-CM | POA: Insufficient documentation

## 2020-04-14 DIAGNOSIS — T8249XA Other complication of vascular dialysis catheter, initial encounter: Secondary | ICD-10-CM | POA: Diagnosis not present

## 2020-04-14 DIAGNOSIS — Z992 Dependence on renal dialysis: Secondary | ICD-10-CM | POA: Insufficient documentation

## 2020-04-14 DIAGNOSIS — T82838A Hemorrhage of vascular prosthetic devices, implants and grafts, initial encounter: Secondary | ICD-10-CM | POA: Diagnosis present

## 2020-04-14 DIAGNOSIS — Z794 Long term (current) use of insulin: Secondary | ICD-10-CM | POA: Diagnosis not present

## 2020-04-14 DIAGNOSIS — E1122 Type 2 diabetes mellitus with diabetic chronic kidney disease: Secondary | ICD-10-CM | POA: Diagnosis not present

## 2020-04-14 DIAGNOSIS — R58 Hemorrhage, not elsewhere classified: Secondary | ICD-10-CM | POA: Insufficient documentation

## 2020-04-14 DIAGNOSIS — T829XXA Unspecified complication of cardiac and vascular prosthetic device, implant and graft, initial encounter: Secondary | ICD-10-CM

## 2020-04-14 LAB — COMPREHENSIVE METABOLIC PANEL
ALT: 10 U/L (ref 0–44)
AST: 12 U/L — ABNORMAL LOW (ref 15–41)
Albumin: 2.6 g/dL — ABNORMAL LOW (ref 3.5–5.0)
Alkaline Phosphatase: 100 U/L (ref 38–126)
Anion gap: 12 (ref 5–15)
BUN: 26 mg/dL — ABNORMAL HIGH (ref 6–20)
CO2: 23 mmol/L (ref 22–32)
Calcium: 8.6 mg/dL — ABNORMAL LOW (ref 8.9–10.3)
Chloride: 102 mmol/L (ref 98–111)
Creatinine, Ser: 3.73 mg/dL — ABNORMAL HIGH (ref 0.61–1.24)
GFR, Estimated: 18 mL/min — ABNORMAL LOW (ref >60)
Glucose, Bld: 182 mg/dL — ABNORMAL HIGH (ref 70–99)
Potassium: 3.8 mmol/L (ref 3.5–5.1)
Sodium: 137 mmol/L (ref 135–145)
Total Bilirubin: 0.7 mg/dL (ref 0.3–1.2)
Total Protein: 7.4 g/dL (ref 6.5–8.1)

## 2020-04-14 LAB — CBC
HCT: 31.4 % — ABNORMAL LOW (ref 39.0–52.0)
Hemoglobin: 9.3 g/dL — ABNORMAL LOW (ref 13.0–17.0)
MCH: 26.5 pg (ref 26.0–34.0)
MCHC: 29.6 g/dL — ABNORMAL LOW (ref 30.0–36.0)
MCV: 89.5 fL (ref 80.0–100.0)
Platelets: 276 10*3/uL (ref 150–400)
RBC: 3.51 MIL/uL — ABNORMAL LOW (ref 4.22–5.81)
RDW: 16.8 % — ABNORMAL HIGH (ref 11.5–15.5)
WBC: 7.2 10*3/uL (ref 4.0–10.5)
nRBC: 0 % (ref 0.0–0.2)

## 2020-04-14 LAB — LIPASE, BLOOD: Lipase: 23 U/L (ref 11–51)

## 2020-04-14 MED ORDER — CEPHALEXIN 250 MG PO CAPS: 250.0000 mg | ORAL_CAPSULE | Freq: Two times a day (BID) | ORAL | 0 refills | Status: DC

## 2020-04-14 NOTE — ED Notes (Signed)
Pt still not responding to vital recheck

## 2020-04-14 NOTE — ED Triage Notes (Signed)
Pt reports he was at dialysis this am, got hooked up and began vomiting. Was told dialysis access in chest is swollen and had some bleeding. Just had cathter placed last night. Denies fevers.

## 2020-04-14 NOTE — ED Notes (Signed)
Pt called for vital recheck no response.
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
## 2020-04-14 NOTE — Discharge Instructions (Addendum)
Take the antibiotics as directed-- sent to your pharmacy.  Continue keeping catheter site clean. Follow-up with Dr. Donnetta Hutching. Return here for new concerns.

## 2020-04-15 NOTE — ED Notes (Addendum)
Pt d/c by MD and is provided w/ d/c instructions and follow up care, Pt is out of the ED in wheel chair  

## 2020-04-20 LAB — CULTURE, BLOOD (ROUTINE X 2): Culture: NO GROWTH

## 2020-04-25 ENCOUNTER — Other Ambulatory Visit: Payer: Self-pay

## 2020-04-25 ENCOUNTER — Encounter (HOSPITAL_COMMUNITY): Payer: Self-pay

## 2020-04-25 ENCOUNTER — Emergency Department (HOSPITAL_COMMUNITY): Payer: Medicaid Other

## 2020-04-25 ENCOUNTER — Emergency Department (HOSPITAL_COMMUNITY)
Admission: EM | Admit: 2020-04-25 | Discharge: 2020-04-26 | Disposition: A | Discharge status: Home / Self Care | Payer: Medicaid Other | Attending: Emergency Medicine | Admitting: Emergency Medicine

## 2020-04-25 DIAGNOSIS — I5042 Chronic combined systolic (congestive) and diastolic (congestive) heart failure: Secondary | ICD-10-CM | POA: Insufficient documentation

## 2020-04-25 DIAGNOSIS — E1140 Type 2 diabetes mellitus with diabetic neuropathy, unspecified: Secondary | ICD-10-CM | POA: Diagnosis not present

## 2020-04-25 DIAGNOSIS — E11628 Type 2 diabetes mellitus with other skin complications: Secondary | ICD-10-CM | POA: Insufficient documentation

## 2020-04-25 DIAGNOSIS — Z79899 Other long term (current) drug therapy: Secondary | ICD-10-CM | POA: Diagnosis not present

## 2020-04-25 DIAGNOSIS — E1122 Type 2 diabetes mellitus with diabetic chronic kidney disease: Secondary | ICD-10-CM | POA: Diagnosis not present

## 2020-04-25 DIAGNOSIS — Z992 Dependence on renal dialysis: Secondary | ICD-10-CM

## 2020-04-25 DIAGNOSIS — E1129 Type 2 diabetes mellitus with other diabetic kidney complication: Secondary | ICD-10-CM | POA: Insufficient documentation

## 2020-04-25 DIAGNOSIS — N186 End stage renal disease: Secondary | ICD-10-CM | POA: Insufficient documentation

## 2020-04-25 DIAGNOSIS — I132 Hypertensive heart and chronic kidney disease with heart failure and with stage 5 chronic kidney disease, or end stage renal disease: Secondary | ICD-10-CM | POA: Insufficient documentation

## 2020-04-25 DIAGNOSIS — R609 Edema, unspecified: Secondary | ICD-10-CM | POA: Diagnosis present

## 2020-04-25 DIAGNOSIS — Z7982 Long term (current) use of aspirin: Secondary | ICD-10-CM | POA: Insufficient documentation

## 2020-04-25 DIAGNOSIS — F17210 Nicotine dependence, cigarettes, uncomplicated: Secondary | ICD-10-CM | POA: Diagnosis not present

## 2020-04-25 LAB — BASIC METABOLIC PANEL
Anion gap: 13 (ref 5–15)
BUN: 39 mg/dL — ABNORMAL HIGH (ref 6–20)
CO2: 22 mmol/L (ref 22–32)
Calcium: 7.9 mg/dL — ABNORMAL LOW (ref 8.9–10.3)
Chloride: 103 mmol/L (ref 98–111)
Creatinine, Ser: 4 mg/dL — ABNORMAL HIGH (ref 0.61–1.24)
GFR, Estimated: 17 mL/min — ABNORMAL LOW (ref >60)
Glucose, Bld: 145 mg/dL — ABNORMAL HIGH (ref 70–99)
Potassium: 4.3 mmol/L (ref 3.5–5.1)
Sodium: 138 mmol/L (ref 135–145)

## 2020-04-25 LAB — CBG MONITORING, ED: Glucose-Capillary: 173 mg/dL — ABNORMAL HIGH (ref 70–99)

## 2020-04-25 LAB — CBC
HCT: 31.5 % — ABNORMAL LOW (ref 39.0–52.0)
Hemoglobin: 9.6 g/dL — ABNORMAL LOW (ref 13.0–17.0)
MCH: 26.4 pg (ref 26.0–34.0)
MCHC: 30.5 g/dL (ref 30.0–36.0)
MCV: 86.5 fL (ref 80.0–100.0)
Platelets: 268 10*3/uL (ref 150–400)
RBC: 3.64 MIL/uL — ABNORMAL LOW (ref 4.22–5.81)
RDW: 16.7 % — ABNORMAL HIGH (ref 11.5–15.5)
WBC: 6.9 10*3/uL (ref 4.0–10.5)
nRBC: 0 % (ref 0.0–0.2)

## 2020-04-25 NOTE — ED Triage Notes (Signed)
Pt reports excess fluid with swelling in his groin and sob on exertion. Pt also reports bilateral blurred vision since last night. Pt receives dialysis T/Th. Sat, last treatment was Saturday. No neuro deficits noted in triage.

## 2020-04-26 NOTE — ED Notes (Signed)
Signed            Show:Clear all [x] Manual[] Template[] Copied Added by: [x] Janan Ridge, RN   [] Hover for details  Patient in wheelchair came out of the room to demand that we call his sister at the number he provided. This was attempted and message left for sister on answering service per patient request. He said that he wanted admission for the testicle swelling that was ongoing for 3 months and unchanging. Attempted to explain to patinet that the hospital is very full at this time. Patient then became very irritable that the hospital administrator had not come down to talk to him and called his ED provider "a clown" Patient then told nursing that he was through with them as well and that he would see [place] all in court as he wheeled himself out toward the exit. Patient with no resp distress, speaking in full sentences and with full neuro strength in upper extremities. All belongings were cleared out of room upon his exit and he left nothing behind. No PIV needed to be removed.
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
## 2020-04-26 NOTE — Discharge Instructions (Signed)
Go to your dialysis today!

## 2020-05-17 ENCOUNTER — Other Ambulatory Visit: Payer: Self-pay

## 2020-05-17 ENCOUNTER — Emergency Department (HOSPITAL_COMMUNITY)
Admission: EM | Admit: 2020-05-17 | Discharge: 2020-05-18 | Disposition: A | Discharge status: Home / Self Care | Payer: Medicaid Other | Attending: Emergency Medicine | Admitting: Emergency Medicine

## 2020-05-17 ENCOUNTER — Encounter (HOSPITAL_COMMUNITY): Payer: Self-pay | Admitting: Emergency Medicine

## 2020-05-17 DIAGNOSIS — R569 Unspecified convulsions: Secondary | ICD-10-CM | POA: Insufficient documentation

## 2020-05-17 DIAGNOSIS — E1122 Type 2 diabetes mellitus with diabetic chronic kidney disease: Secondary | ICD-10-CM | POA: Diagnosis not present

## 2020-05-17 DIAGNOSIS — F17210 Nicotine dependence, cigarettes, uncomplicated: Secondary | ICD-10-CM | POA: Insufficient documentation

## 2020-05-17 DIAGNOSIS — Z992 Dependence on renal dialysis: Secondary | ICD-10-CM | POA: Insufficient documentation

## 2020-05-17 DIAGNOSIS — I132 Hypertensive heart and chronic kidney disease with heart failure and with stage 5 chronic kidney disease, or end stage renal disease: Secondary | ICD-10-CM | POA: Diagnosis not present

## 2020-05-17 DIAGNOSIS — N185 Chronic kidney disease, stage 5: Secondary | ICD-10-CM | POA: Diagnosis not present

## 2020-05-17 DIAGNOSIS — Z79899 Other long term (current) drug therapy: Secondary | ICD-10-CM | POA: Insufficient documentation

## 2020-05-17 DIAGNOSIS — I5042 Chronic combined systolic (congestive) and diastolic (congestive) heart failure: Secondary | ICD-10-CM | POA: Diagnosis not present

## 2020-05-17 DIAGNOSIS — Z7982 Long term (current) use of aspirin: Secondary | ICD-10-CM | POA: Diagnosis not present

## 2020-05-17 LAB — BASIC METABOLIC PANEL
Anion gap: 14 (ref 5–15)
BUN: 47 mg/dL — ABNORMAL HIGH (ref 6–20)
CO2: 23 mmol/L (ref 22–32)
Calcium: 9.2 mg/dL (ref 8.9–10.3)
Chloride: 103 mmol/L (ref 98–111)
Creatinine, Ser: 3.87 mg/dL — ABNORMAL HIGH (ref 0.61–1.24)
GFR, Estimated: 17 mL/min — ABNORMAL LOW (ref >60)
Glucose, Bld: 203 mg/dL — ABNORMAL HIGH (ref 70–99)
Potassium: 3.3 mmol/L — ABNORMAL LOW (ref 3.5–5.1)
Sodium: 140 mmol/L (ref 135–145)

## 2020-05-17 LAB — CBC
HCT: 38.1 % — ABNORMAL LOW (ref 39.0–52.0)
Hemoglobin: 11.5 g/dL — ABNORMAL LOW (ref 13.0–17.0)
MCH: 25.4 pg — ABNORMAL LOW (ref 26.0–34.0)
MCHC: 30.2 g/dL (ref 30.0–36.0)
MCV: 84.1 fL (ref 80.0–100.0)
Platelets: 239 10*3/uL (ref 150–400)
RBC: 4.53 MIL/uL (ref 4.22–5.81)
RDW: 17.7 % — ABNORMAL HIGH (ref 11.5–15.5)
WBC: 7.2 10*3/uL (ref 4.0–10.5)
nRBC: 0 % (ref 0.0–0.2)

## 2020-05-17 NOTE — ED Triage Notes (Signed)
Pt arrives to ED by POV with c/o of seizure today at dialysis. Pt reports he seized 2 hours into dialysis today and it was witnessed by staff. Pt reports that staff saw him "pass out", start shaking, and then unresponsive for around 15 to 30 minutes. EMS was called to scene but pt refused care. Pt states he feels normal now. No SOB, weakness, CP, headache, or confusion.

## 2020-05-18 ENCOUNTER — Encounter (HOSPITAL_COMMUNITY): Payer: Self-pay | Admitting: Emergency Medicine

## 2020-05-18 ENCOUNTER — Emergency Department (HOSPITAL_COMMUNITY): Payer: Medicaid Other

## 2020-05-18 NOTE — ED Notes (Addendum)
Pt back from CT

## 2020-05-18 NOTE — ED Notes (Signed)
Pt transported to CT ?
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
## 2020-05-18 NOTE — ED Notes (Signed)
Patient verbalizes understanding of discharge instructions. Follow up care reviewed. Opportunity for questioning and answers were provided. Armband removed by staff, pt discharged from ED via personal wheelchair.

## 2020-05-18 NOTE — Discharge Instructions (Addendum)
No driving for 6 months or until cleared by neurology  

## 2020-05-26 ENCOUNTER — Emergency Department (HOSPITAL_COMMUNITY)
Admission: EM | Admit: 2020-05-26 | Discharge: 2020-05-26 | Discharge status: Against Medical Advice | Payer: Medicaid Other | Attending: Emergency Medicine | Admitting: Emergency Medicine

## 2020-05-26 ENCOUNTER — Other Ambulatory Visit: Payer: Self-pay

## 2020-05-26 DIAGNOSIS — N185 Chronic kidney disease, stage 5: Secondary | ICD-10-CM | POA: Insufficient documentation

## 2020-05-26 DIAGNOSIS — Z794 Long term (current) use of insulin: Secondary | ICD-10-CM | POA: Insufficient documentation

## 2020-05-26 DIAGNOSIS — F17210 Nicotine dependence, cigarettes, uncomplicated: Secondary | ICD-10-CM | POA: Insufficient documentation

## 2020-05-26 DIAGNOSIS — Z7982 Long term (current) use of aspirin: Secondary | ICD-10-CM | POA: Diagnosis not present

## 2020-05-26 DIAGNOSIS — E1122 Type 2 diabetes mellitus with diabetic chronic kidney disease: Secondary | ICD-10-CM | POA: Insufficient documentation

## 2020-05-26 DIAGNOSIS — R569 Unspecified convulsions: Secondary | ICD-10-CM | POA: Insufficient documentation

## 2020-05-26 DIAGNOSIS — I132 Hypertensive heart and chronic kidney disease with heart failure and with stage 5 chronic kidney disease, or end stage renal disease: Secondary | ICD-10-CM | POA: Diagnosis not present

## 2020-05-26 DIAGNOSIS — Z992 Dependence on renal dialysis: Secondary | ICD-10-CM | POA: Insufficient documentation

## 2020-05-26 DIAGNOSIS — I5042 Chronic combined systolic (congestive) and diastolic (congestive) heart failure: Secondary | ICD-10-CM | POA: Insufficient documentation

## 2020-05-26 MED ORDER — LEVETIRACETAM IN NACL 1500 MG/100ML IV SOLN: 1500.0000 mg | Freq: Once | INTRAVENOUS | Status: DC

## 2020-05-26 NOTE — ED Notes (Signed)
Pt uncooperative demanding to leave.  Provider notified.  Pt demanding his wheelchair from dialysis. This nurse explained to Mr. Delfavero that we did not have his wheelchair and if he signed out AMA we would wheel him to the front lobby.  Pt signed AMA stating "I dont know none of you people Im from a doctor in [place] and dont need to be here."   Pt states you dont need an attitude with me and stop rolling your eyes.  This nurse assisted to the patient to the front lobby to phone his ride home.

## 2020-05-26 NOTE — ED Triage Notes (Signed)
Report from EMS pt has a seizure during dialysis.  Upon arrival EMS found pt to be alert and oriented, pt is being seen by [facility] for a Neurology Consult.  Pt received 1.05 min for dialysis today.
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
## 2020-05-30 ENCOUNTER — Ambulatory Visit: Payer: Medicaid Other | Admitting: Neurology

## 2020-06-01 ENCOUNTER — Other Ambulatory Visit (HOSPITAL_COMMUNITY): Payer: Self-pay | Admitting: Nephrology

## 2020-06-01 ENCOUNTER — Other Ambulatory Visit: Payer: Self-pay

## 2020-06-01 ENCOUNTER — Ambulatory Visit (HOSPITAL_COMMUNITY)
Admission: RE | Admit: 2020-06-01 | Discharge: 2020-06-01 | Disposition: A | Discharge status: Home / Self Care | Payer: Medicaid Other | Source: Ambulatory Visit | Attending: Nephrology | Admitting: Nephrology

## 2020-06-01 DIAGNOSIS — N186 End stage renal disease: Secondary | ICD-10-CM

## 2020-06-01 DIAGNOSIS — Z992 Dependence on renal dialysis: Secondary | ICD-10-CM

## 2020-06-01 DIAGNOSIS — Z4901 Encounter for fitting and adjustment of extracorporeal dialysis catheter: Secondary | ICD-10-CM | POA: Insufficient documentation

## 2020-06-01 HISTORY — PX: IR REMOVAL TUN CV CATH W/O FL: IMG2289

## 2020-06-01 MED ORDER — LIDOCAINE HCL 1 % IJ SOLN
INTRAMUSCULAR | Status: AC
  Filled 2020-06-01: qty 20

## 2020-06-03 ENCOUNTER — Other Ambulatory Visit: Payer: Self-pay

## 2020-06-03 ENCOUNTER — Encounter (HOSPITAL_COMMUNITY): Payer: Self-pay | Admitting: Emergency Medicine

## 2020-06-03 ENCOUNTER — Emergency Department (HOSPITAL_COMMUNITY)
Admission: EM | Admit: 2020-06-03 | Discharge: 2020-06-03 | Disposition: A | Discharge status: Home / Self Care | Payer: Medicaid Other | Attending: Emergency Medicine | Admitting: Emergency Medicine

## 2020-06-03 DIAGNOSIS — T82590A Other mechanical complication of surgically created arteriovenous fistula, initial encounter: Secondary | ICD-10-CM | POA: Diagnosis not present

## 2020-06-03 DIAGNOSIS — N186 End stage renal disease: Secondary | ICD-10-CM | POA: Insufficient documentation

## 2020-06-03 DIAGNOSIS — I132 Hypertensive heart and chronic kidney disease with heart failure and with stage 5 chronic kidney disease, or end stage renal disease: Secondary | ICD-10-CM | POA: Insufficient documentation

## 2020-06-03 DIAGNOSIS — Z794 Long term (current) use of insulin: Secondary | ICD-10-CM | POA: Diagnosis not present

## 2020-06-03 DIAGNOSIS — Z992 Dependence on renal dialysis: Secondary | ICD-10-CM | POA: Insufficient documentation

## 2020-06-03 DIAGNOSIS — I509 Heart failure, unspecified: Secondary | ICD-10-CM | POA: Diagnosis not present

## 2020-06-03 DIAGNOSIS — E1122 Type 2 diabetes mellitus with diabetic chronic kidney disease: Secondary | ICD-10-CM | POA: Insufficient documentation

## 2020-06-03 DIAGNOSIS — F17210 Nicotine dependence, cigarettes, uncomplicated: Secondary | ICD-10-CM | POA: Insufficient documentation

## 2020-06-03 DIAGNOSIS — Z7982 Long term (current) use of aspirin: Secondary | ICD-10-CM | POA: Insufficient documentation

## 2020-06-03 LAB — BASIC METABOLIC PANEL
Anion gap: 11 (ref 5–15)
BUN: 38 mg/dL — ABNORMAL HIGH (ref 6–20)
CO2: 22 mmol/L (ref 22–32)
Calcium: 7.8 mg/dL — ABNORMAL LOW (ref 8.9–10.3)
Chloride: 101 mmol/L (ref 98–111)
Creatinine, Ser: 4.45 mg/dL — ABNORMAL HIGH (ref 0.61–1.24)
GFR, Estimated: 15 mL/min — ABNORMAL LOW (ref >60)
Glucose, Bld: 270 mg/dL — ABNORMAL HIGH (ref 70–99)
Potassium: 3.7 mmol/L (ref 3.5–5.1)
Sodium: 134 mmol/L — ABNORMAL LOW (ref 135–145)

## 2020-06-03 LAB — CBC WITH DIFFERENTIAL/PLATELET
Abs Immature Granulocytes: 0.02 10*3/uL (ref 0.00–0.07)
Basophils Absolute: 0 10*3/uL (ref 0.0–0.1)
Basophils Relative: 0 %
Eosinophils Absolute: 0.1 10*3/uL (ref 0.0–0.5)
Eosinophils Relative: 1 %
HCT: 39.3 % (ref 39.0–52.0)
Hemoglobin: 11.9 g/dL — ABNORMAL LOW (ref 13.0–17.0)
Immature Granulocytes: 0 %
Lymphocytes Relative: 22 %
Lymphs Abs: 1.2 10*3/uL (ref 0.7–4.0)
MCH: 25.9 pg — ABNORMAL LOW (ref 26.0–34.0)
MCHC: 30.3 g/dL (ref 30.0–36.0)
MCV: 85.4 fL (ref 80.0–100.0)
Monocytes Absolute: 0.5 10*3/uL (ref 0.1–1.0)
Monocytes Relative: 8 %
Neutro Abs: 3.9 10*3/uL (ref 1.7–7.7)
Neutrophils Relative %: 69 %
Platelets: 223 10*3/uL (ref 150–400)
RBC: 4.6 MIL/uL (ref 4.22–5.81)
RDW: 18.8 % — ABNORMAL HIGH (ref 11.5–15.5)
WBC: 5.7 10*3/uL (ref 4.0–10.5)
nRBC: 0 % (ref 0.0–0.2)

## 2020-06-03 MED ORDER — OXYCODONE-ACETAMINOPHEN 5-325 MG PO TABS
1.0000 | ORAL_TABLET | Freq: Once | ORAL | Status: AC
  Administered 2020-06-03: 1 via ORAL
  Filled 2020-06-03: qty 1

## 2020-06-03 NOTE — ED Triage Notes (Signed)
Pt arrives to ED from dialysis, he was 3.5 hours into his 4 hour treatment today when he reports his dialysis cathter become "clogged'. Pt states a large bruise and hematoma formed during the dialysis and was sent to ER to have this evaluated.

## 2020-06-03 NOTE — Discharge Instructions (Addendum)
You were seen in the ER for fistula malfunction  Vascular surgery saw you and nephrology will arrange clot removal on Monday   Someone will call you to let you know the details of your appointment on Monday to remove the clot from your graft  FLUID RESTRICTION LESS THAN 30 OZ OF LIQUIDS A DAY  AVOID ANY FOOD OR DRINK WITH POTASSIUM IN IT  Return for chest pain shortness of breath significant leg swelling fever
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
## 2020-06-03 NOTE — ED Notes (Signed)
Pt began to yell at staff calling people stupid in the hallway as he was waiting for paperwork, pt did not wait for paperwork and rolled himself to lobby

## 2020-06-03 NOTE — ED Notes (Signed)
Pt states sent here for Dr. Donnetta Hutching to fix vascular access.

## 2020-06-03 NOTE — Consult Note (Signed)
ASSESSMENT & PLAN:  60 y.o. male with thrombosed LUE axillary loop AVG. No urgent indication for thrombectomy from my standpoint.  Recommend checking chemistry panel and discussing with nephrology if patient needs urgent dialysis. Recommend discussion with IR service on Monday for percutaneous thrombectomy of AVG. If access cannot be salvaged, will be happy to place new access.  CHIEF COMPLAINT:   AVG thrombosed.  HISTORY:  HISTORY OF PRESENT ILLNESS: Zachary Young is a 61 y.o. male well known to our service. He has had multiple failed access surgeries. He is currently dialyzing through a left upper extremity axillary loop AVG placed by Dr. Donnetta Hutching on 04/05/20. He received 3.5 hours of a planned 4 hour treatment before his access thrombosed. He underwent treatments Monday and Wednesday this week. He reports no shortness of breath.   Past Medical History:  Diagnosis Date  . CHF (congestive heart failure) ([place])   . Diabetes mellitus without complication ([place])   . Peripheral edema   . Renal disorder    kidney disease    Past Surgical History:  Procedure Laterality Date  . AMPUTATION Right 10/21/2019   Procedure: RIGHT BELOW KNEE AMPUTATION;  Surgeon: Newt Minion, MD;  Location: [place];  Service: Orthopedics;  Laterality: Right;  . AV FISTULA PLACEMENT Left 11/13/2019   Procedure: LEFT ARTERIOVENOUS (AV) FISTULA VERSUS ARTERIOVENOUS GRAFT;  Surgeon: Rosetta Posner, MD;  Location: [facility] OR;  Service: Vascular;  Laterality: Left;  . AV FISTULA PLACEMENT Left 04/05/2020   Procedure: INSERTION OF LEFT ARM ARTERIOVENOUS (AV) GORE-TEX GRAFT;  Surgeon: Rosetta Posner, MD;  Location: [place];  Service: Vascular;  Laterality: Left;  . BASCILIC VEIN TRANSPOSITION Left 01/27/2020   Procedure: LEFT ARM 2ND STAGE BASCILIC VEIN TRANSPOSITION;  Surgeon: Rosetta Posner, MD;  Location: [place];  Service: Vascular;  Laterality: Left;  . DIALYSIS/PERMA CATHETER INSERTION N/A 03/03/2020   Procedure:  DIALYSIS/PERMA CATHETER INSERTION;  Surgeon: Algernon Huxley, MD;  Location: [place] [facility];  Service: Cardiovascular;  Laterality: N/A;  . INSERTION OF DIALYSIS CATHETER N/A 04/05/2020   Procedure: INSERTION OF TUNNEL  DIALYSIS CATHETER LEFT INTERNAL JUGULAR;  Surgeon: Rosetta Posner, MD;  Location: [place];  Service: Vascular;  Laterality: N/A;  . IR FLUORO GUIDE CV LINE RIGHT  11/05/2019  . IR REMOVAL TUN CV CATH W/O FL  06/01/2020  . IR US GUIDE VASC ACCESS RIGHT  11/05/2019  . REMOVAL OF A DIALYSIS CATHETER Right 04/05/2020   Procedure: REMOVAL OF RIGHT CHEST DIALYSIS CATHETER;  Surgeon: Rosetta Posner, MD;  Location: [place];  Service: Vascular;  Laterality: Right;  . TOE AMPUTATION Bilateral    due to osteomyelitis, all 5 each foot    Family History  Problem Relation Age of Onset  . Cancer Mother     Social History   Socioeconomic History  . Marital status: Single    Spouse name: Not on file  . Number of children: Not on file  . Years of education: Not on file  . Highest education level: Not on file  Occupational History  . Not on file  Tobacco Use  . Smoking status: Current Every Day Smoker    Packs/day: 0.50    Types: Cigarettes  . Smokeless tobacco: Never Used  . Tobacco comment: smoked this am  Vaping Use  . Vaping Use: Never used  Substance and Sexual Activity  . Alcohol use: Not Currently  . Drug use: Yes    Types: Cocaine  Comment: does not use  . Sexual activity: Not on file  Other Topics Concern  . Not on file  Social History Narrative  . Not on file   Social Determinants of Health   Financial Resource Strain: Not on file  Food Insecurity: Not on file  Transportation Needs: Not on file  Physical Activity: Not on file  Stress: Not on file  Social Connections: Not on file  Intimate Partner Violence: Not on file    Allergies  Allergen Reactions  . Bee Venom Anaphylaxis    Per pt, nearly died from bee sting as a child  . Propofol Other (See  Comments)     Hallucinations  . Morphine Nausea And Vomiting  . Oxycodone Nausea And Vomiting    No current facility-administered medications for this encounter.   Current Outpatient Medications  Medication Sig Dispense Refill  . albuterol (VENTOLIN HFA) 108 (90 Base) MCG/ACT inhaler Inhale 1 puff into the lungs every 4 (four) hours as needed for wheezing or shortness of breath. 6.7 g 0  . aspirin EC 81 MG EC tablet Take 1 tablet (81 mg total) by mouth daily. 30 tablet 0  . cephALEXin (KEFLEX) 250 MG capsule Take 1 capsule (250 mg total) by mouth 2 (two) times daily. (Patient not taking: No sig reported) 14 capsule 0  . ergocalciferol (VITAMIN D2) 1.25 MG (50000 UT) capsule Take 1 capsule (50,000 Units total) by mouth every Friday. 30 capsule 1  . HYDROcodone-acetaminophen (NORCO) 5-325 MG tablet Take 1 tablet by mouth every 6 (six) hours as needed for moderate pain. 15 tablet 0  . insulin aspart (NOVOLOG) 100 UNIT/ML injection Inject 10 Units into the skin 3 (three) times daily with meals. (Patient taking differently: Inject 4 Units into the skin 3 (three) times daily with meals.) 10 mL 11  . insulin glargine (LANTUS) 100 UNIT/ML injection Inject 0.35 mLs (35 Units total) into the skin at bedtime. (Patient taking differently: Inject 25 Units into the skin at bedtime.) 10 mL 11  . Insulin Pen Needle (PEN NEEDLES 31GX5/16") 31G X 8 MM MISC 1 each by Other route daily. 90 each 2  . midodrine (PROAMATINE) 10 MG tablet Take 1 tablet (10 mg total) by mouth 3 (three) times daily with meals. 180 tablet 2  . pregabalin (LYRICA) 50 MG capsule Take 1 capsule (50 mg total) by mouth 2 (two) times daily. 60 capsule 2    REVIEW OF SYSTEMS:  '[X]'$  denotes positive finding, '[ ]'$  denotes negative finding Cardiac  Comments:  Chest pain or chest pressure:    Shortness of breath upon exertion:    Short of breath when lying flat:    Irregular heart rhythm:        Vascular    Pain in calf, thigh, or hip  brought on by ambulation:    Pain in feet at night that wakes you up from your sleep:     Blood clot in your veins:    Leg swelling:         Pulmonary    Oxygen at home:    Productive cough:     Wheezing:         Neurologic    Sudden weakness in arms or legs:     Sudden numbness in arms or legs:     Sudden onset of difficulty speaking or slurred speech:    Temporary loss of vision in one eye:     Problems with dizziness:  Gastrointestinal    Blood in stool:     Vomited blood:         Genitourinary    Burning when urinating:     Blood in urine:        Psychiatric    Major depression:         Hematologic    Bleeding problems:    Problems with blood clotting too easily:        Skin    Rashes or ulcers:        Constitutional    Fever or chills:     PHYSICAL EXAM:   Vitals:   06/03/20 1553 06/03/20 1554 06/03/20 1833 06/03/20 1845  BP: (!) 157/88  (!) 163/93 (!) 166/91  Pulse: 88  86 85  Resp: '18 18 18   '$ Temp:      TempSrc:      SpO2: 99%  97% 97%   Constitutional: chronically ill appearing in no distress. Appears well nourished.  Neurologic: CN intact. No focal findings. No sensory loss. Psychiatric: Mood and affect symmetric and appropriate. Eyes: No icterus. No conjunctival pallor. Ears, nose, throat: mucous membranes moist. Midline trachea.  Cardiac: regular rate and rhythm.  Respiratory: unlabored. Abdominal: soft, non-tender, non-distended.  Peripheral vascular:  2+ L radial pulse  Absent thrill in LUE axillary loop AVG Extremity: No edema. No cyanosis. No pallor.  Skin: No gangrene. No ulceration.  Lymphatic: No Stemmer's sign. No palpable lymphadenopathy.   DATA REVIEW:    Most recent CBC CBC Latest Ref Rng & Units 05/17/2020 04/25/2020 04/14/2020  WBC 4.0 - 10.5 K/uL 7.2 6.9 7.2  Hemoglobin 13.0 - 17.0 g/dL 11.5(L) 9.6(L) 9.3(L)  Hematocrit 39.0 - 52.0 % 38.1(L) 31.5(L) 31.4(L)  Platelets 150 - 400 K/uL 239 268 276     Most recent  CMP CMP Latest Ref Rng & Units 05/17/2020 04/25/2020 04/14/2020  Glucose 70 - 99 mg/dL 203(H) 145(H) 182(H)  BUN 6 - 20 mg/dL 47(H) 39(H) 26(H)  Creatinine 0.61 - 1.24 mg/dL 3.87(H) 4.00(H) 3.73(H)  Sodium 135 - 145 mmol/L 140 138 137  Potassium 3.5 - 5.1 mmol/L 3.3(L) 4.3 3.8  Chloride 98 - 111 mmol/L 103 103 102  CO2 22 - 32 mmol/L '23 22 23  '$ Calcium 8.9 - 10.3 mg/dL 9.2 7.9(L) 8.6(L)  Total Protein 6.5 - 8.1 g/dL - - 7.4  Total Bilirubin 0.3 - 1.2 mg/dL - - 0.7  Alkaline Phos 38 - 126 U/L - - 100  AST 15 - 41 U/L - - 12(L)  ALT 0 - 44 U/L - - 10    Renal function Estimated Creatinine Clearance: 31.2 mL/min (A) (by C-G formula based on SCr of 3.87 mg/dL (H)).  Hgb A1c MFr Bld (%)  Date Value  10/17/2019 8.8 (H)    LDL Cholesterol  Date Value Ref Range Status  08/16/2019 50 0 - 99 mg/dL Final    Comment:           Total Cholesterol/HDL:CHD Risk Coronary Heart Disease Risk Table                     Men   Women  1/2 Average Risk   3.4   3.3  Average Risk       5.0   4.4  2 X Average Risk   9.6   7.1  3 X Average Risk  23.4   11.0        Use the calculated Patient Ratio above and the  CHD Risk Table to determine the patient's CHD Risk.        ATP III CLASSIFICATION (LDL):  <100     mg/dL   Optimal  100-129  mg/dL   Near or Above                    Optimal  130-159  mg/dL   Borderline  160-189  mg/dL   High  >190     mg/dL   Very High Performed at [facility] [address] [place], [place] [address]      Yevonne Aline. Stanford Breed, MD [facility] Phone Number: [phone] 06/03/2020 7:51 PM

## 2020-06-04 ENCOUNTER — Other Ambulatory Visit: Payer: Self-pay

## 2020-06-04 ENCOUNTER — Emergency Department
Admission: EM | Admit: 2020-06-04 | Discharge: 2020-06-04 | Disposition: A | Discharge status: Home / Self Care | Payer: Medicaid Other | Attending: Emergency Medicine | Admitting: Emergency Medicine

## 2020-06-04 DIAGNOSIS — I132 Hypertensive heart and chronic kidney disease with heart failure and with stage 5 chronic kidney disease, or end stage renal disease: Secondary | ICD-10-CM | POA: Diagnosis not present

## 2020-06-04 DIAGNOSIS — Z79899 Other long term (current) drug therapy: Secondary | ICD-10-CM | POA: Diagnosis not present

## 2020-06-04 DIAGNOSIS — Z7982 Long term (current) use of aspirin: Secondary | ICD-10-CM | POA: Insufficient documentation

## 2020-06-04 DIAGNOSIS — F17210 Nicotine dependence, cigarettes, uncomplicated: Secondary | ICD-10-CM | POA: Diagnosis not present

## 2020-06-04 DIAGNOSIS — N185 Chronic kidney disease, stage 5: Secondary | ICD-10-CM | POA: Diagnosis not present

## 2020-06-04 DIAGNOSIS — E1122 Type 2 diabetes mellitus with diabetic chronic kidney disease: Secondary | ICD-10-CM | POA: Insufficient documentation

## 2020-06-04 DIAGNOSIS — Z794 Long term (current) use of insulin: Secondary | ICD-10-CM | POA: Diagnosis not present

## 2020-06-04 DIAGNOSIS — I5042 Chronic combined systolic (congestive) and diastolic (congestive) heart failure: Secondary | ICD-10-CM | POA: Diagnosis not present

## 2020-06-04 DIAGNOSIS — Z992 Dependence on renal dialysis: Secondary | ICD-10-CM | POA: Diagnosis not present

## 2020-06-04 DIAGNOSIS — E1140 Type 2 diabetes mellitus with diabetic neuropathy, unspecified: Secondary | ICD-10-CM | POA: Diagnosis not present

## 2020-06-04 DIAGNOSIS — T82868D Thrombosis of vascular prosthetic devices, implants and grafts, subsequent encounter: Secondary | ICD-10-CM | POA: Diagnosis present

## 2020-06-04 MED ORDER — OXYCODONE-ACETAMINOPHEN 5-325 MG PO TABS
1.0000 | ORAL_TABLET | Freq: Once | ORAL | Status: AC
  Administered 2020-06-04: 1 via ORAL
  Filled 2020-06-04: qty 1

## 2020-06-04 NOTE — ED Triage Notes (Signed)
Pt states L arm fistula is clogged. States received dialysis yesterday for 3.5 hours and then stopped working. Went back this AM and wasn't working so came to ER. States normal dialysis days are Tues, Thurs, Sat. A&O, in wheelchair.

## 2020-06-04 NOTE — ED Notes (Signed)
Pt refuses D/C vitals. NAD noted.

## 2020-06-04 NOTE — ED Notes (Signed)
Attempted to obtain blood. Unable to at this time.
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
## 2020-06-04 NOTE — ED Notes (Signed)
First encounter with pt to D/C. Pt refuses assessment, vitals, or blood work. MD states "give him the percocet then pt can go". Pt states '"m here because my dialysis port is clogged". Pt will not this RN assess problematic area.

## 2020-06-06 ENCOUNTER — Encounter (HOSPITAL_COMMUNITY): Payer: Self-pay

## 2020-06-06 ENCOUNTER — Other Ambulatory Visit: Payer: Self-pay

## 2020-06-06 ENCOUNTER — Other Ambulatory Visit (HOSPITAL_COMMUNITY): Payer: Self-pay | Admitting: Interventional Radiology

## 2020-06-06 ENCOUNTER — Other Ambulatory Visit: Payer: Self-pay | Admitting: Radiology

## 2020-06-06 ENCOUNTER — Ambulatory Visit (HOSPITAL_COMMUNITY)
Admission: RE | Admit: 2020-06-06 | Discharge: 2020-06-06 | Disposition: A | Discharge status: Home / Self Care | Payer: Medicaid Other | Source: Ambulatory Visit | Attending: Interventional Radiology | Admitting: Interventional Radiology

## 2020-06-06 ENCOUNTER — Other Ambulatory Visit: Payer: Self-pay | Admitting: Student

## 2020-06-06 DIAGNOSIS — E1122 Type 2 diabetes mellitus with diabetic chronic kidney disease: Secondary | ICD-10-CM | POA: Insufficient documentation

## 2020-06-06 DIAGNOSIS — Z79899 Other long term (current) drug therapy: Secondary | ICD-10-CM | POA: Insufficient documentation

## 2020-06-06 DIAGNOSIS — Z885 Allergy status to narcotic agent status: Secondary | ICD-10-CM | POA: Insufficient documentation

## 2020-06-06 DIAGNOSIS — T82868S Thrombosis of vascular prosthetic devices, implants and grafts, sequela: Secondary | ICD-10-CM

## 2020-06-06 DIAGNOSIS — Z794 Long term (current) use of insulin: Secondary | ICD-10-CM | POA: Insufficient documentation

## 2020-06-06 DIAGNOSIS — Z7982 Long term (current) use of aspirin: Secondary | ICD-10-CM | POA: Diagnosis not present

## 2020-06-06 DIAGNOSIS — F17210 Nicotine dependence, cigarettes, uncomplicated: Secondary | ICD-10-CM | POA: Diagnosis not present

## 2020-06-06 DIAGNOSIS — Y841 Kidney dialysis as the cause of abnormal reaction of the patient, or of later complication, without mention of misadventure at the time of the procedure: Secondary | ICD-10-CM | POA: Insufficient documentation

## 2020-06-06 DIAGNOSIS — Z91030 Bee allergy status: Secondary | ICD-10-CM | POA: Diagnosis not present

## 2020-06-06 DIAGNOSIS — Z992 Dependence on renal dialysis: Secondary | ICD-10-CM | POA: Insufficient documentation

## 2020-06-06 DIAGNOSIS — T82868A Thrombosis of vascular prosthetic devices, implants and grafts, initial encounter: Secondary | ICD-10-CM | POA: Diagnosis present

## 2020-06-06 DIAGNOSIS — N186 End stage renal disease: Secondary | ICD-10-CM | POA: Insufficient documentation

## 2020-06-06 HISTORY — PX: IR THROMBECTOMY AV FISTULA W/THROMBOLYSIS/PTA/STENT INC/SHUNT/IMG LT: IMG6107

## 2020-06-06 HISTORY — PX: IR US GUIDE VASC ACCESS LEFT: IMG2389

## 2020-06-06 HISTORY — PX: IR THROMBECTOMY AV FISTULA W/THROMBOLYSIS/PTA INC/SHUNT/IMG LEFT: IMG6106

## 2020-06-06 LAB — GLUCOSE, CAPILLARY
Glucose-Capillary: 350 mg/dL — ABNORMAL HIGH (ref 70–99)
Glucose-Capillary: 208 mg/dL — ABNORMAL HIGH (ref 70–99)

## 2020-06-06 MED ORDER — MIDAZOLAM HCL 2 MG/2ML IJ SOLN
INTRAMUSCULAR | Status: AC | PRN
  Administered 2020-06-06 (×3): 0.5 mg via INTRAVENOUS

## 2020-06-06 MED ORDER — HEPARIN SODIUM (PORCINE) 1000 UNIT/ML IJ SOLN
INTRAMUSCULAR | Status: AC
  Filled 2020-06-06: qty 1

## 2020-06-06 MED ORDER — LIDOCAINE HCL (PF) 1 % IJ SOLN
INTRAMUSCULAR | Status: AC | PRN
  Administered 2020-06-06: 10 mL

## 2020-06-06 MED ORDER — SODIUM CHLORIDE 0.9 % IV SOLN: INTRAVENOUS | Status: AC | PRN

## 2020-06-06 MED ORDER — FENTANYL CITRATE (PF) 100 MCG/2ML IJ SOLN
INTRAMUSCULAR | Status: AC | PRN
  Administered 2020-06-06 (×3): 25 ug via INTRAVENOUS

## 2020-06-06 MED ORDER — HEPARIN SODIUM (PORCINE) 1000 UNIT/ML IJ SOLN
INTRAMUSCULAR | Status: AC | PRN
  Administered 2020-06-06: 5000 [IU] via INTRAVENOUS

## 2020-06-06 MED ORDER — FENTANYL CITRATE (PF) 100 MCG/2ML IJ SOLN
INTRAMUSCULAR | Status: AC
  Filled 2020-06-06: qty 2

## 2020-06-06 MED ORDER — SODIUM CHLORIDE 0.9 % IV SOLN: INTRAVENOUS | Status: DC

## 2020-06-06 MED ORDER — ALTEPLASE 2 MG IJ SOLR
INTRAMUSCULAR | Status: AC
  Filled 2020-06-06: qty 8

## 2020-06-06 MED ORDER — LIDOCAINE-EPINEPHRINE 1 %-1:100000 IJ SOLN
INTRAMUSCULAR | Status: AC
  Filled 2020-06-06: qty 1

## 2020-06-06 MED ORDER — IOHEXOL 300 MG/ML  SOLN
100.0000 mL | Freq: Once | INTRAMUSCULAR | Status: AC | PRN
  Administered 2020-06-06: 80 mL via INTRATHECAL

## 2020-06-06 MED ORDER — MIDAZOLAM HCL 2 MG/2ML IJ SOLN
INTRAMUSCULAR | Status: AC
  Filled 2020-06-06: qty 2

## 2020-06-06 NOTE — Procedures (Signed)
Interventional Radiology Procedure Note  Procedure: Left arteriovenous graft declot  Findings: Please refer to procedural dictation for full description.  Completely thrombosed axillary-axillary graft.  Pharmacomechanical thrombolysis with 4 mg t-PA throughout graft.  Fogarty thrombectomy of arterial anastomosis followed by rheolytic thrombectomy of entire graft.  Balloon angioplasty of stenosis at arterial anastomosis, graft apex, and venous anastomosis.  Patent graft upon completion with excellent palpable thrill.  Complications: Limited axillary hematoma at venous anastomosis, spontaneously resolved.  Estimated Blood Loss: < 10 mL  Recommendations: Left upper arm loop AVG OK for use at HD immediately.   Ruthann Cancer, MD Pager: [phone]

## 2020-06-06 NOTE — Discharge Instructions (Signed)
Dialysis Fistulogram, Care After The following information offers guidance on how to care for yourself after your procedure. Your health care provider may also give you more specific instructions. If you have problems or questions, contact your health care provider. What can I expect after the procedure? After the procedure, it is common to have:  A small amount of discomfort in the area where the small tube (catheter) was placed for the procedure.  A small amount of bruising around the fistula.  Sleepiness and tiredness (fatigue). Follow these instructions at home: Puncture site care  Follow instructions from your health care provider about how to take care of the site where catheters were inserted. Make sure you: ? Wash your hands with soap and water for at least 20 seconds before and after you change your bandage (dressing). If soap and water are not available, use hand sanitizer. ? Change your dressing as told by your health care provider. ? Leave stitches (sutures), skin glue, or adhesive strips in place. These skin closures may need to stay in place for 2 weeks or longer. If adhesive strip edges start to loosen and curl up, you may trim the loose edges. Do not remove adhesive strips completely unless your health care provider tells you to do that.  Check your puncture area every day for signs of infection. Check for: ? More redness, swelling, or pain. ? Fluid or blood. ? Warmth. ? Pus or a bad smell.   Activity  Rest as much as you can.  If you were given a sedative during the procedure, it can affect you for several hours. Do not drive or operate machinery until your health care provider says that it is safe.  Do not lift anything that is heavier than 5 lb (2.3 kg), or the limit that you are told, on the day of your procedure.  Do not do anything strenuous with your arm for the rest of the day. Avoid household activities, such as vacuuming.  Return to your normal activities as  told by your health care provider. Ask your health care provider what activities are safe for you. Safety To prevent damage to your graft or fistula:  Do not wear tight-fitting clothing or jewelry on the arm or leg that has your graft or fistula.  Tell all your health care providers that you have a dialysis fistula or graft.  Do not allow blood draws, IVs, or blood pressure readings to be done in the arm that has your fistula or graft.  Do not allow flu shots or vaccinations in the arm with your fistula or graft. General instructions  Take over-the-counter and prescription medicines only as told by your health care provider.  Do not take baths, swim, or use a hot tub until your health care provider approves. Ask your health care provider if you may take showers. You may only be allowed to take sponge baths.  Monitor your dialysis fistula closely. Check to make sure that you can feel a vibration or buzz (a thrill) when you put your fingers over the fistula.  Keep all follow-up visits. This is important. Contact a health care provider if:  You have more redness, swelling, or pain at the site where the catheter was put in.  You have fluid or blood coming from the catheter site.  You have pus or a bad smell coming from the catheter site.  Your catheter site feels warm.  You have a fever or chills. Get help right away if:    You have bleeding from the vascular access site that does not stop.  You feel weak.  You have trouble balancing.  You have trouble moving your arms or legs.  You have problems with your speech or vision.  You can no longer feel a vibration or buzz when you put your fingers over your fistula.  The limb that was used for the procedure swells or becomes painful, cold, blue, or pale white.  You have chest pain or shortness of breath. These symptoms may represent a serious problem that is an emergency. Do not wait to see if the symptoms will go away. Get  medical help right away. Call your local emergency services (911 in the [place] Do not drive yourself to the hospital. Summary  After a dialysis fistulogram, it is common to have a small amount of discomfort or bruising in the area where the small, thin tube (catheter) was placed.  Rest as much as you can after your procedure. Return to your normal activities as told by your health care provider.  Take over-the-counter and prescription medicines only as told by your health care provider.  Follow instructions from your health care provider about how to take care of the site where the catheter was inserted.  Keep all follow-up visits. This is important. This information is not intended to replace advice given to you by your health care provider. Make sure you discuss any questions you have with your health care provider. Document Revised: 11/11/2019 Document Reviewed: 11/11/2019 Elsevier Patient Education  2021 [employer] Inc.  

## 2020-06-06 NOTE — H&P (Signed)
Chief Complaint: Patient was seen in consultation today for left upper arm dialysis graft declot at the request Dr Jamelle Haring    Supervising Physician: Ruthann Cancer  Patient Status: [facility] - Out-pt  History of Present Illness: Zachary Young is a 60 y.o. male   Pt was seen in ED Saturday 06/03/20 Lt arm dialysis graft clotted Left upper arm axillary loop AVG was placed 04/05/20 by Dr Donnetta Hutching Evaluated in ED 2/18 by Vasc surgeon Dr Stanford Breed Dialysis T/Th/S--- did not finish Sat-- only 3 hrs "Clotted"  Per Dr Stanford Breed note 2/18 8 pm:   60 y.o. male with thrombosed LUE axillary loop AVG. No urgent indication for thrombectomy from my standpoint.  Recommend checking chemistry panel and discussing with nephrology if patient needs urgent dialysis. Recommend discussion with IR service on Monday for percutaneous thrombectomy of AVG. If access cannot be salvaged, will be happy to place new access.  Back to ED 2/19-- Wants graft "fixed" K 3.7 and felt to be stable per Vasc surgeon and again by ED MD To return to IR Today for evaluation and thrombolysis or tunneled cath placement Denies SOB; CP; N/V  Scheduled now for left upper arm dialysis graft evaluation possible thrombolysis/intervention. Possible dialysis catheter placement  Glucose 350 in RN station  Past Medical History:  Diagnosis Date  . CHF (congestive heart failure) ([place])   . Diabetes mellitus without complication ([place])   . Peripheral edema   . Renal disorder    kidney disease    Past Surgical History:  Procedure Laterality Date  . AMPUTATION Right 10/21/2019   Procedure: RIGHT BELOW KNEE AMPUTATION;  Surgeon: Newt Minion, MD;  Location: [place];  Service: Orthopedics;  Laterality: Right;  . AV FISTULA PLACEMENT Left 11/13/2019   Procedure: LEFT ARTERIOVENOUS (AV) FISTULA VERSUS ARTERIOVENOUS GRAFT;  Surgeon: Rosetta Posner, MD;  Location: [facility] OR;  Service: Vascular;  Laterality: Left;  . AV FISTULA PLACEMENT Left  04/05/2020   Procedure: INSERTION OF LEFT ARM ARTERIOVENOUS (AV) GORE-TEX GRAFT;  Surgeon: Rosetta Posner, MD;  Location: [place];  Service: Vascular;  Laterality: Left;  . BASCILIC VEIN TRANSPOSITION Left 01/27/2020   Procedure: LEFT ARM 2ND STAGE BASCILIC VEIN TRANSPOSITION;  Surgeon: Rosetta Posner, MD;  Location: [place];  Service: Vascular;  Laterality: Left;  . DIALYSIS/PERMA CATHETER INSERTION N/A 03/03/2020   Procedure: DIALYSIS/PERMA CATHETER INSERTION;  Surgeon: Algernon Huxley, MD;  Location: [place] CV [facility];  Service: Cardiovascular;  Laterality: N/A;  . INSERTION OF DIALYSIS CATHETER N/A 04/05/2020   Procedure: INSERTION OF TUNNEL  DIALYSIS CATHETER LEFT INTERNAL JUGULAR;  Surgeon: Rosetta Posner, MD;  Location: [place];  Service: Vascular;  Laterality: N/A;  . IR FLUORO GUIDE CV LINE RIGHT  11/05/2019  . IR REMOVAL TUN CV CATH W/O FL  06/01/2020  . IR US GUIDE VASC ACCESS RIGHT  11/05/2019  . REMOVAL OF A DIALYSIS CATHETER Right 04/05/2020   Procedure: REMOVAL OF RIGHT CHEST DIALYSIS CATHETER;  Surgeon: Rosetta Posner, MD;  Location: [place];  Service: Vascular;  Laterality: Right;  . TOE AMPUTATION Bilateral    due to osteomyelitis, all 5 each foot    Allergies: Bee venom, Propofol, Morphine, and Oxycodone  Medications: Prior to Admission medications   Medication Sig Start Date End Date Taking? Authorizing Provider  aspirin EC 81 MG EC tablet Take 1 tablet (81 mg total) by mouth daily. 08/21/19  Yes Gladys Damme, MD  ergocalciferol (VITAMIN D2) 1.25 MG (50000 UT) capsule  Take 1 capsule (50,000 Units total) by mouth every Friday. 12/11/19  Yes Samella Parr, NP  furosemide (LASIX) 80 MG tablet Take by mouth 2 (two) times daily.   Yes [provider]  insulin aspart (NOVOLOG) 100 UNIT/ML injection Inject 10 Units into the skin 3 (three) times daily with meals. Patient taking differently: Inject 4 Units into the skin 3 (three) times daily with meals. 12/11/19  Yes Samella Parr, NP  insulin glargine (LANTUS) 100 UNIT/ML injection Inject 0.35 mLs (35 Units total) into the skin at bedtime. Patient taking differently: Inject 25 Units into the skin at bedtime. 12/11/19  Yes Samella Parr, NP  midodrine (PROAMATINE) 10 MG tablet Take 1 tablet (10 mg total) by mouth 3 (three) times daily with meals. 12/11/19  Yes Samella Parr, NP  pregabalin (LYRICA) 50 MG capsule Take 1 capsule (50 mg total) by mouth 2 (two) times daily. 12/11/19  Yes Samella Parr, NP  albuterol (VENTOLIN HFA) 108 (90 Base) MCG/ACT inhaler Inhale 1 puff into the lungs every 4 (four) hours as needed for wheezing or shortness of breath. 08/20/19   Gladys Damme, MD  cephALEXin (KEFLEX) 250 MG capsule Take 1 capsule (250 mg total) by mouth 2 (two) times daily. Patient not taking: No sig reported 04/14/20   Larene Pickett, PA-C  HYDROcodone-acetaminophen [facility]) 5-325 MG tablet Take 1 tablet by mouth every 6 (six) hours as needed for moderate pain. 04/05/20   Dagoberto Ligas, PA-C  Insulin Pen Needle (PEN NEEDLES 31GX5/16") 31G X 8 MM MISC 1 each by Other route daily. 12/11/19 12/10/20  Samella Parr, NP     Family History  Problem Relation Age of Onset  . Cancer Mother     Social History   Socioeconomic History  . Marital status: Single    Spouse name: Not on file  . Number of children: Not on file  . Years of education: Not on file  . Highest education level: Not on file  Occupational History  . Not on file  Tobacco Use  . Smoking status: Current Every Day Smoker    Packs/day: 0.50    Types: Cigarettes  . Smokeless tobacco: Never Used  . Tobacco comment: smoked this am  Vaping Use  . Vaping Use: Never used  Substance and Sexual Activity  . Alcohol use: Not Currently  . Drug use: Yes    Types: Cocaine    Comment: does not use  . Sexual activity: Not on file  Other Topics Concern  . Not on file  Social History Narrative  . Not on file   Social Determinants of Health    Financial Resource Strain: Not on file  Food Insecurity: Not on file  Transportation Needs: Not on file  Physical Activity: Not on file  Stress: Not on file  Social Connections: Not on file    Review of Systems: A 12 point ROS discussed and pertinent positives are indicated in the HPI above.  All other systems are negative.  Review of Systems  Constitutional: Positive for fatigue. Negative for activity change, fever and unexpected weight change.  Respiratory: Negative for cough and shortness of breath.   Cardiovascular: Negative for chest pain.  Gastrointestinal: Negative for abdominal pain.  Psychiatric/Behavioral: Negative for behavioral problems and confusion.    Vital Signs: BP (!) 155/90   Pulse 83   Temp 97.7 F (36.5 C) (Oral)   Resp 17   Ht '6\' 3"'$  (1.905 m)   Wt  295 lb (133.8 kg)   SpO2 100%   BMI 36.87 kg/m   Physical Exam Vitals reviewed.  HENT:     Mouth/Throat:     Mouth: Mucous membranes are moist.  Cardiovascular:     Rate and Rhythm: Normal rate and regular rhythm.     Heart sounds: Normal heart sounds.  Pulmonary:     Breath sounds: Normal breath sounds.  Abdominal:     Palpations: Abdomen is soft.     Tenderness: There is no abdominal tenderness.  Musculoskeletal:     Comments: Bilat amputations: RBKA; L partial foot amputation  Left upper arm dialyais graft no pulse; no thrill   Skin:    General: Skin is warm.  Neurological:     Mental Status: He is oriented to person, place, and time.     Comments: Very groggy Can be awakened and be oriented to place; time; date; name [place] almost asleep within seconds     Imaging: CT Head Wo Contrast  Result Date: 05/18/2020 CLINICAL DATA:  Seizure during dialysis. EXAM: CT HEAD WITHOUT CONTRAST TECHNIQUE: Contiguous axial images were obtained from the base of the skull through the vertex without intravenous contrast. COMPARISON:  None. FINDINGS: Brain: No evidence of acute infarction,  hemorrhage, hydrocephalus, extra-axial collection or mass lesion/mass effect. Low-density in the deep cerebral white matter attributed to chronic small vessel ischemia given medical history. Cerebral volume loss without specific pattern. Vascular: No hyperdense vessel or unexpected calcification. Skull: Normal. Negative for fracture or focal lesion. Sinuses/Orbits: No acute finding. IMPRESSION: 1. No acute or focal finding. 2. Chronic small vessel disease. Electronically Signed   By: Monte Fantasia M.D.   On: 05/18/2020 04:55   IR Removal Tun Cv Cath W/O FL  Result Date: 06/01/2020 INDICATION: Patient with history of end-stage renal disease on dialysis via tunneled HD catheter placed 11/06/2019 by Dr. Earleen Newport. He now has a working AV graft and is no longer in need of his tunneled dialysis catheter. Request is made for removal. EXAM: REMOVAL TUNNELED CENTRAL VENOUS CATHETER MEDICATIONS: 10 mL 1% lidocaine ANESTHESIA/SEDATION: None FLUOROSCOPY TIME:  None COMPLICATIONS: None immediate. PROCEDURE: Informed written consent was obtained from the patient after a thorough discussion of the procedural risks, benefits and alternatives. All questions were addressed. Maximal Sterile Barrier Technique was utilized including caps, mask, sterile gowns, sterile gloves, sterile drape, hand hygiene and skin antiseptic. A timeout was performed prior to the initiation of the procedure. The patient's right chest and catheter was prepped and draped in a normal sterile fashion. Heparin was removed from both ports of catheter. 1% lidocaine was used for local anesthesia. Using gentle blunt dissection the cuff of the catheter was exposed and the catheter was removed in its entirety. Pressure was held till hemostasis was obtained. A sterile dressing was applied. The patient tolerated the procedure well with no immediate complications. IMPRESSION: Successful catheter removal as described above. Read by: Brynda Greathouse PA-C Electronically  Signed   By: Jacqulynn Cadet M.D.   On: 06/01/2020 15:09    Labs:  CBC: Recent Labs    04/14/20 1310 04/25/20 1644 05/17/20 1623 06/03/20 2004  WBC 7.2 6.9 7.2 5.7  HGB 9.3* 9.6* 11.5* 11.9*  HCT 31.4* 31.5* 38.1* 39.3  PLT 276 268 239 223    COAGS: Recent Labs    08/30/19 2152 11/05/19 1221  INR 1.0 1.0    BMP: Recent Labs    12/05/19 0722 12/08/19 0853 12/10/19 1202 12/12/19 0750 01/27/20 1000 04/14/20 1310  04/25/20 1644 05/17/20 1623 06/03/20 2004  NA 136 135 137 135   < > 137 138 140 134*  K 3.8 3.9 4.5 4.0   < > 3.8 4.3 3.3* 3.7  CL 96* 93* 98 91*   < > 102 103 103 101  CO2 '24 27 27 25   '$ < > '23 22 23 22  '$ GLUCOSE 303* 134* 155* 161*   < > 182* 145* 203* 270*  BUN 57* 81* 73* 64*   < > 26* 39* 47* 38*  CALCIUM 9.2 9.3 9.3 9.2   < > 8.6* 7.9* 9.2 7.8*  CREATININE 5.77* 6.89* 6.23* 7.16*   < > 3.73* 4.00* 3.87* 4.45*  GFRNONAA 10* 8* 9* 8*   < > 18* 17* 17* 15*  GFRAA 12* 9* 11* 9*  --   --   --   --   --    < > = values in this interval not displayed.    LIVER FUNCTION TESTS: Recent Labs    03/15/20 1349 03/16/20 1007 03/29/20 1831 04/14/20 1310  BILITOT 0.5 0.7 0.9 0.7  AST 10* 10* 17 12*  ALT '9 6 9 10  '$ ALKPHOS 87 92 100 100  PROT 7.3 7.2 7.6 7.4  ALBUMIN 2.6* 2.9* 2.7* 2.6*    TUMOR MARKERS: No results for input(s): AFPTM, CEA, CA199, CHROMGRNA in the last 8760 hours.  Assessment and Plan:  ESRD Clotted LUE dialysis graft Scheduled for evaluation with possible thrombolysis/intervention Possible tunneled dialysis catheter placement Glucose high at 350 today  Scheduled for evaluation of LUE dialysis graft; possible thrombolysis /intervention. Risks and benefits discussed with the patient including, but not limited to bleeding, infection, vascular injury, pulmonary embolism, need for tunneled HD catheter placement or even death.  All of the patient's questions were answered, patient is agreeable to proceed. Consent signed and in  chart.  Thank you for this interesting consult.  I greatly enjoyed meeting Zachary Young and look forward to participating in their care.  A copy of this report was sent to the requesting provider on this date.  Electronically Signed: Lavonia Drafts, PA-C 06/06/2020, 1:53 PM   I spent a total of    25 Minutes in face to face in clinical consultation, greater than 50% of which was counseling/coordinating care for LUE dialysis graft evaluation/intervention/tunneled HD cath placement

## 2020-06-06 NOTE — Sedation Documentation (Addendum)
I-stat Chem 8 collected and performed intra-procedure  Na+ 178 K+ 3.8 Cl 91 iCa 0.81  Glu 151 BUN 70 Crea 4.1 Hct 28 Hb 9.5

## 2020-06-07 ENCOUNTER — Other Ambulatory Visit (HOSPITAL_COMMUNITY): Payer: Self-pay | Admitting: Interventional Radiology

## 2020-06-07 ENCOUNTER — Other Ambulatory Visit: Payer: Self-pay | Admitting: Radiology

## 2020-06-07 ENCOUNTER — Encounter (HOSPITAL_COMMUNITY): Payer: Self-pay

## 2020-06-07 ENCOUNTER — Ambulatory Visit (HOSPITAL_COMMUNITY)
Admission: RE | Admit: 2020-06-07 | Discharge: 2020-06-07 | Disposition: A | Discharge status: Home / Self Care | Payer: Medicaid Other | Source: Ambulatory Visit | Attending: Interventional Radiology | Admitting: Interventional Radiology

## 2020-06-07 DIAGNOSIS — F17210 Nicotine dependence, cigarettes, uncomplicated: Secondary | ICD-10-CM | POA: Insufficient documentation

## 2020-06-07 DIAGNOSIS — E1122 Type 2 diabetes mellitus with diabetic chronic kidney disease: Secondary | ICD-10-CM | POA: Diagnosis not present

## 2020-06-07 DIAGNOSIS — T82898A Other specified complication of vascular prosthetic devices, implants and grafts, initial encounter: Secondary | ICD-10-CM

## 2020-06-07 DIAGNOSIS — T82868A Thrombosis of vascular prosthetic devices, implants and grafts, initial encounter: Secondary | ICD-10-CM | POA: Diagnosis not present

## 2020-06-07 DIAGNOSIS — Z992 Dependence on renal dialysis: Secondary | ICD-10-CM | POA: Diagnosis not present

## 2020-06-07 DIAGNOSIS — Z91030 Bee allergy status: Secondary | ICD-10-CM | POA: Insufficient documentation

## 2020-06-07 DIAGNOSIS — N186 End stage renal disease: Secondary | ICD-10-CM | POA: Insufficient documentation

## 2020-06-07 DIAGNOSIS — Z888 Allergy status to other drugs, medicaments and biological substances status: Secondary | ICD-10-CM | POA: Diagnosis not present

## 2020-06-07 DIAGNOSIS — Y841 Kidney dialysis as the cause of abnormal reaction of the patient, or of later complication, without mention of misadventure at the time of the procedure: Secondary | ICD-10-CM | POA: Insufficient documentation

## 2020-06-07 DIAGNOSIS — Z7982 Long term (current) use of aspirin: Secondary | ICD-10-CM | POA: Diagnosis not present

## 2020-06-07 DIAGNOSIS — Z79899 Other long term (current) drug therapy: Secondary | ICD-10-CM | POA: Insufficient documentation

## 2020-06-07 DIAGNOSIS — Z794 Long term (current) use of insulin: Secondary | ICD-10-CM | POA: Diagnosis not present

## 2020-06-07 DIAGNOSIS — Z885 Allergy status to narcotic agent status: Secondary | ICD-10-CM | POA: Insufficient documentation

## 2020-06-07 HISTORY — PX: IR US GUIDE VASC ACCESS RIGHT: IMG2390

## 2020-06-07 HISTORY — PX: IR PERC TUN PERIT CATH WO PORT S&I /IMAG: IMG2327

## 2020-06-07 HISTORY — PX: IR FLUORO GUIDE CV LINE RIGHT: IMG2283

## 2020-06-07 LAB — GLUCOSE, CAPILLARY: Glucose-Capillary: 338 mg/dL — ABNORMAL HIGH (ref 70–99)

## 2020-06-07 MED ORDER — CEFAZOLIN SODIUM-DEXTROSE 2-4 GM/100ML-% IV SOLN
INTRAVENOUS | Status: AC
  Administered 2020-06-07: 2 g
  Filled 2020-06-07: qty 100

## 2020-06-07 MED ORDER — FENTANYL CITRATE (PF) 100 MCG/2ML IJ SOLN
INTRAMUSCULAR | Status: AC | PRN
  Administered 2020-06-07: 25 ug via INTRAVENOUS

## 2020-06-07 MED ORDER — FENTANYL CITRATE (PF) 100 MCG/2ML IJ SOLN
INTRAMUSCULAR | Status: AC
  Filled 2020-06-07: qty 2

## 2020-06-07 MED ORDER — HEPARIN SODIUM (PORCINE) 1000 UNIT/ML IJ SOLN
INTRAMUSCULAR | Status: AC
  Filled 2020-06-07: qty 1

## 2020-06-07 MED ORDER — MIDAZOLAM HCL 2 MG/2ML IJ SOLN
INTRAMUSCULAR | Status: AC
  Filled 2020-06-07: qty 2

## 2020-06-07 MED ORDER — MIDAZOLAM HCL 2 MG/2ML IJ SOLN
INTRAMUSCULAR | Status: AC | PRN
  Administered 2020-06-07: 0.5 mg via INTRAVENOUS

## 2020-06-07 MED ORDER — LIDOCAINE-EPINEPHRINE 1 %-1:100000 IJ SOLN
INTRAMUSCULAR | Status: AC
  Filled 2020-06-07: qty 1

## 2020-06-07 NOTE — Procedures (Signed)
Pre-procedure Diagnosis: ESRD; failed attempted declot Post-procedure Diagnosis: Same  Successful placement of tunneled HD catheter with tips terminating within the superior aspect of the right atrium.    Complications: None Immediate EBL: None  The catheter is ready for immediate use.   Ronny Bacon, MD Pager #: [phone]

## 2020-06-07 NOTE — Discharge Instructions (Addendum)
Central Line Dialysis Access Placement, Care After This sheet gives you information about how to care for yourself after your procedure. Your health care provider may also give you more specific instructions. If you have problems or questions, contact your health care provider. What can I expect after the procedure? After the procedure, it is common to have:  Mild pain or discomfort.  Mild redness, swelling, or bruising around your incision.  A small amount of blood or clear fluid coming from your incision. Follow these instructions at home: Medicines  Take over-the-counter and prescription medicines only as told by your health care provider.  If you were prescribed an antibiotic medicine, take it as told by your health care provider. Do not stop using the antibiotic even if you start to feel better. Incision care  Follow instructions from your health care provider about how to take care of your incision. Make sure you: ? Wash your hands with soap and water for at least 20 seconds before and after you change your bandage (dressing). If soap and water are not available, use hand sanitizer. ? Change your dressing as told by your health care provider. ? Leave stitches (sutures) in place.  Check your incision area every day for signs of infection. Check for: ? More redness, swelling, or pain. ? More fluid or blood. ? Warmth. ? Pus or a bad smell.   Managing pain and swelling  If directed, apply heat to the affected area as often as told by your health care provider. Use the heat source that your health care provider recommends, such as a moist heat pack or a heating pad. ? Place a towel between your skin and the heat source. ? Leave the heat on for 20-30 minutes. ? Remove the heat if your skin turns bright red. This is especially important if you are unable to feel pain, heat, or cold. You may have a greater risk of getting burned.  If directed, put ice on the catheter site. To do  this: ? Put ice in a plastic bag. ? Place a towel between your skin and the bag. ? Leave the ice on for 20 minutes, 2-3 times a day. Activity  If you were given a sedative during the procedure, it can affect you for several hours. Do not drive or operate machinery until your health care provider says that it is safe.  Do not lift anything that is heavier than 10 lb (4.5 kg), or the limit that you are told, until your health care provider says that it is safe.  Return to your normal activities as told by your health care provider. Ask your health care provider what activities are safe for you. General instructions  Follow instructions from your health care provider about eating or drinking restrictions.  Do not use any products that contain nicotine or tobacco, such as cigarettes, e-cigarettes, and chewing tobacco. These can delay incision healing after surgery. If you need help quitting, ask your health care provider.  Do not take baths, swim, or use a hot tub until your health care provider approves. Ask your health care provider if you may take showers. You may only be allowed to take sponge baths.  Wear compression stockings as told by your health care provider. These stockings help to prevent blood clots and reduce swelling in your legs.  Keep all follow-up visits as told by your health care provider. This is important.   Contact a health care provider if:  Your catheter gets pulled   out of place.  Your catheter site becomes itchy.  You develop a rash around your catheter site.  You have any of these signs of infection: ? More redness, swelling, or pain around your incision. ? More fluid or blood coming from your incision. ? Warmth coming from your incision. ? Pus or a bad smell coming from your incision. ? A fever. Get help right away if:  You become light-headed or dizzy.  You have chest pain or a fast heart rate.  You faint.  You have difficulty breathing.  Your  catheter gets pulled out completely.  You have discomfort in your arms, back, neck, or jaw. These symptoms may represent a serious problem that is an emergency. Do not wait to see if the symptoms will go away. Get medical help right away. Call your local emergency services (911 in the [place] Do not drive yourself to the hospital. Summary  After the procedure, it is common to have mild symptoms of pain, redness, swelling, or bruising around your incision. It is also common to have a small amount of blood or clear fluid coming from your incision.  Check your incision area every day for signs of infection.  Do not take baths, swim, or use a hot tub until your health care provider approves. Ask your health care provider if you may take showers. You may only be allowed to take sponge baths.  Get help right away if you have chest pain or difficulty breathing, or if your catheter gets pulled out completely. This information is not intended to replace advice given to you by your health care provider. Make sure you discuss any questions you have with your health care provider. Document Revised: 02/19/2019 Document Reviewed: 02/19/2019 [employer] Patient Education  2021 [employer] Moderate Conscious Sedation, Adult Sedation is the use of medicines to promote relaxation and to relieve discomfort and anxiety. Moderate conscious sedation is a type of sedation. Under moderate conscious sedation, you are less alert than normal, but you are still able to respond to instructions, touch, or both. Moderate conscious sedation is used during short medical and dental procedures. It is milder than deep sedation, which is a type of sedation under which you cannot be easily woken up. It is also milder than general anesthesia, which is the use of medicines to make you unconscious. Moderate conscious sedation allows you to return to your regular activities sooner. Tell a health care provider about:  Any allergies you  have.  All medicines you are taking, including vitamins, herbs, eye drops, creams, and over-the-counter medicines.  Any use of steroids. This includes steroids taken by mouth or as a cream.  Any problems you or family members have had with sedatives and anesthetic medicines.  Any blood disorders you have.  Any surgeries you have had.  Any medical conditions you have, such as sleep apnea.  Whether you are pregnant or may be pregnant.  Any use of cigarettes, alcohol, marijuana, or drugs. What are the risks? Generally, this is a safe procedure. However, problems may occur, including:  Getting too much medicine (oversedation).  Nausea.  Allergic reaction to medicines.  Trouble breathing. If this happens, a breathing tube may be used. It will be removed when you are awake and breathing on your own.  Heart trouble.  Lung trouble.  Confusion that gets better with time (emergence delirium). What happens before the procedure? Staying hydrated Follow instructions from your health care provider about hydration, which may include:  Up to 2   hours before the procedure - you may continue to drink clear liquids, such as water, clear fruit juice, black coffee, and plain tea. Eating and drinking restrictions Follow instructions from your health care provider about eating and drinking, which may include:  8 hours before the procedure - stop eating heavy meals or foods, such as meat, fried foods, or fatty foods.  6 hours before the procedure - stop eating light meals or foods, such as toast or cereal.  6 hours before the procedure - stop drinking milk or drinks that contain milk.  2 hours before the procedure - stop drinking clear liquids. Medicines Ask your health care provider about:  Changing or stopping your regular medicines. This is especially important if you are taking diabetes medicines or blood thinners.  Taking medicines such as aspirin and ibuprofen. These medicines can  thin your blood. Do not take these medicines unless your health care provider tells you to take them.  Taking over-the-counter medicines, vitamins, herbs, and supplements. Tests and exams  You will have a physical exam.  You may have blood tests done to show how well: ? Your kidneys and liver work. ? Your blood clots. General instructions  Plan to have a responsible adult take you home from the hospital or clinic.  If you will be going home right after the procedure, plan to have a responsible adult care for you for the time you are told. This is important. What happens during the procedure?  You will be given the sedative. The sedative may be given: ? As a pill that you will swallow. It can also be inserted into the rectum. ? As a spray through the nose. ? As an injection into the muscle. ? As an injection into the vein through an IV.  You may be given oxygen as needed.  Your breathing, heart rate, and blood pressure will be monitored during the procedure.  The medical or dental procedure will be done. The procedure may vary among health care providers and hospitals.   What happens after the procedure?  Your blood pressure, heart rate, breathing rate, and blood oxygen level will be monitored until you leave the hospital or clinic.  You will get fluids through your IV if needed.  Do not drive or operate machinery until your health care provider says that it is safe. Summary  Sedation is the use of medicines to promote relaxation and to relieve discomfort and anxiety. Moderate conscious sedation is a type of sedation that is used during short medical and dental procedures.  Tell the health care provider about any medical conditions that you have and about all the medicines that you are taking.  You will be given the sedative as a pill, a spray through the nose, an injection into the muscle, or an injection into the vein through an IV. Vital signs are monitored during the  sedation.  Moderate conscious sedation allows you to return to your regular activities sooner. This information is not intended to replace advice given to you by your health care provider. Make sure you discuss any questions you have with your health care provider. Document Revised: 07/31/2019 Document Reviewed: 02/26/2019 [employer] Patient Education  2021 [employer]  

## 2020-06-07 NOTE — Progress Notes (Addendum)
Pt wheeled to the nursing station states take me to the emergency room explained to pt that we had to wait until his ride gets here to take him out. Pt states loudly "Im a Grown ass man, You can't tell me what to do " Pt left in wheelchair rolling himself out. Pt refused to wait on his sister to get here. Unable to go over discharge instructions with pt sister.

## 2020-06-07 NOTE — H&P (Signed)
Chief Complaint: Thrombosis LUE loop graft. Request is for tunneled HD cathter placement.  Referring Physician(s): Dialysis Center  Supervising Physician: Sandi Mariscal  Patient Status: [facility] - Out-pt  History of Present Illness: Zachary Young is a 60 y.o. Young History of ESRD with LUE  axillary loop graft placed on 12.21.21; On 2.2.22 IR. The loop graft was found to be thrombosis and on 2.21.22 IR performed a thrombectomy. Upon completion the graft had a thrill. Patient was seen in dialysis today (2.22.22) where they were unable to access the graft. Patient presents for tunneled HD catheter for ongoing dialysis access.    Patient alert and laying in bed. Currently reporting left arm pain and asking for a "percocet". Return precautions and treatment recommendations and follow-up discussed with the patient who is agreeable with the plan.   Past Medical History:  Diagnosis Date  . CHF (congestive heart failure) ([place])   . Diabetes mellitus without complication ([place])   . Peripheral edema   . Renal disorder    kidney disease    Past Surgical History:  Procedure Laterality Date  . AMPUTATION Right 10/21/2019   Procedure: RIGHT BELOW KNEE AMPUTATION;  Surgeon: Newt Minion, MD;  Location: [place];  Service: Orthopedics;  Laterality: Right;  . AV FISTULA PLACEMENT Left 11/13/2019   Procedure: LEFT ARTERIOVENOUS (AV) FISTULA VERSUS ARTERIOVENOUS GRAFT;  Surgeon: Rosetta Posner, MD;  Location: [facility] OR;  Service: Vascular;  Laterality: Left;  . AV FISTULA PLACEMENT Left 04/05/2020   Procedure: INSERTION OF LEFT ARM ARTERIOVENOUS (AV) GORE-TEX GRAFT;  Surgeon: Rosetta Posner, MD;  Location: [place];  Service: Vascular;  Laterality: Left;  . BASCILIC VEIN TRANSPOSITION Left 01/27/2020   Procedure: LEFT ARM 2ND STAGE BASCILIC VEIN TRANSPOSITION;  Surgeon: Rosetta Posner, MD;  Location: [place];  Service: Vascular;  Laterality: Left;  . DIALYSIS/PERMA CATHETER INSERTION N/A 03/03/2020   Procedure:  DIALYSIS/PERMA CATHETER INSERTION;  Surgeon: Algernon Huxley, MD;  Location: [place] CV LAB;  Service: Cardiovascular;  Laterality: N/A;  . INSERTION OF DIALYSIS CATHETER N/A 04/05/2020   Procedure: INSERTION OF TUNNEL  DIALYSIS CATHETER LEFT INTERNAL JUGULAR;  Surgeon: Rosetta Posner, MD;  Location: [place];  Service: Vascular;  Laterality: N/A;  . IR FLUORO GUIDE CV LINE RIGHT  11/05/2019  . IR REMOVAL TUN CV CATH W/O FL  06/01/2020  . IR THROMBECTOMY AV FISTULA W/THROMBOLYSIS/PTA/STENT INC/SHUNT/IMG LT Left 06/06/2020  . IR US GUIDE VASC ACCESS LEFT  06/06/2020  . IR US GUIDE VASC ACCESS RIGHT  11/05/2019  . REMOVAL OF A DIALYSIS CATHETER Right 04/05/2020   Procedure: REMOVAL OF RIGHT CHEST DIALYSIS CATHETER;  Surgeon: Rosetta Posner, MD;  Location: [place];  Service: Vascular;  Laterality: Right;  . TOE AMPUTATION Bilateral    due to osteomyelitis, all 5 each foot    Allergies: Bee venom, Propofol, Morphine, and Oxycodone  Medications: Prior to Admission medications   Medication Sig Start Date End Date Taking? Authorizing Provider  albuterol (VENTOLIN HFA) 108 (90 Base) MCG/ACT inhaler Inhale 1 puff into the lungs every 4 (four) hours as needed for wheezing or shortness of breath. 08/20/19   Gladys Damme, MD  aspirin EC 81 MG EC tablet Take 1 tablet (81 mg total) by mouth daily. 08/21/19   Gladys Damme, MD  cephALEXin (KEFLEX) 250 MG capsule Take 1 capsule (250 mg total) by mouth 2 (two) times daily. Patient not taking: No sig reported 04/14/20   Larene Pickett, PA-C  ergocalciferol (  VITAMIN D2) 1.25 MG (50000 UT) capsule Take 1 capsule (50,000 Units total) by mouth every Friday. 12/11/19   Samella Parr, NP  furosemide (LASIX) 80 MG tablet Take by mouth 2 (two) times daily.    [provider]  HYDROcodone-acetaminophen (NORCO) 5-325 MG tablet Take 1 tablet by mouth every 6 (six) hours as needed for moderate pain. 04/05/20   Dagoberto Ligas, PA-C  insulin aspart (NOVOLOG) 100  UNIT/ML injection Inject 10 Units into the skin 3 (three) times daily with meals. Patient taking differently: Inject 4 Units into the skin 3 (three) times daily with meals. 12/11/19   Samella Parr, NP  insulin glargine (LANTUS) 100 UNIT/ML injection Inject 0.Zachary mLs (Zachary Units total) into the skin at bedtime. Patient taking differently: Inject 25 Units into the skin at bedtime. 12/11/19   Samella Parr, NP  Insulin Pen Needle (PEN NEEDLES 31GX5/16") 31G X 8 MM MISC 1 each by Other route daily. 12/11/19 12/10/20  Samella Parr, NP  midodrine (PROAMATINE) 10 MG tablet Take 1 tablet (10 mg total) by mouth 3 (three) times daily with meals. 12/11/19   Samella Parr, NP  pregabalin (LYRICA) 50 MG capsule Take 1 capsule (50 mg total) by mouth 2 (two) times daily. 12/11/19   Samella Parr, NP     Family History  Problem Relation Age of Onset  . Cancer Mother     Social History   Socioeconomic History  . Marital status: Single    Spouse name: Not on file  . Number of children: Not on file  . Years of education: Not on file  . Highest education level: Not on file  Occupational History  . Not on file  Tobacco Use  . Smoking status: Current Every Day Smoker    Packs/day: 0.50    Types: Cigarettes  . Smokeless tobacco: Never Used  . Tobacco comment: smoked this am  Vaping Use  . Vaping Use: Never used  Substance and Sexual Activity  . Alcohol use: Not Currently  . Drug use: Yes    Types: Cocaine    Comment: does not use  . Sexual activity: Not on file  Other Topics Concern  . Not on file  Social History Narrative  . Not on file   Social Determinants of Health   Financial Resource Strain: Not on file  Food Insecurity: Not on file  Transportation Needs: Not on file  Physical Activity: Not on file  Stress: Not on file  Social Connections: Not on file      Review of Systems: A 12 point ROS discussed and pertinent positives are indicated in the HPI above.  All other  systems are negative.  Review of Systems  Constitutional: Negative for fever.  HENT: Negative for congestion.   Respiratory: Negative for cough and shortness of breath.   Cardiovascular: Negative for chest pain.  Gastrointestinal: Negative for abdominal pain.  Musculoskeletal: Positive for myalgias ( left arm pain).  Neurological: Negative for headaches.  Psychiatric/Behavioral: Negative for behavioral problems and confusion.    Vital Signs: There were no vitals taken for this visit.  Physical Exam Vitals and nursing note reviewed.  Constitutional:      Appearance: He is well-developed and well-nourished.  HENT:     Head: Normocephalic.  Cardiovascular:     Rate and Rhythm: Normal rate and regular rhythm.     Heart sounds: Normal heart sounds.     Comments: Scars noted from prior LIJ catheters Pulmonary:  Effort: Pulmonary effort is normal.     Breath sounds: Normal breath sounds.  Musculoskeletal:        General: Normal range of motion.     Cervical back: Normal range of motion.     Comments: Partial left foot amputation. BKA on right leg  Skin:    General: Skin is dry.  Neurological:     Mental Status: He is alert and oriented to person, place, and time.  Psychiatric:        Mood and Affect: Mood and affect normal.     Imaging: CT Head Wo Contrast  Result Date: 05/18/2020 CLINICAL DATA:  Seizure during dialysis. EXAM: CT HEAD WITHOUT CONTRAST TECHNIQUE: Contiguous axial images were obtained from the base of the skull through the vertex without intravenous contrast. COMPARISON:  None. FINDINGS: Brain: No evidence of acute infarction, hemorrhage, hydrocephalus, extra-axial collection or mass lesion/mass effect. Low-density in the deep cerebral white matter attributed to chronic small vessel ischemia given medical history. Cerebral volume loss without specific pattern. Vascular: No hyperdense vessel or unexpected calcification. Skull: Normal. Negative for fracture or  focal lesion. Sinuses/Orbits: No acute finding. IMPRESSION: 1. No acute or focal finding. 2. Chronic small vessel disease. Electronically Signed   By: Monte Fantasia M.D.   On: 05/18/2020 04:55   IR Removal Tun Cv Cath W/O FL  Result Date: 06/01/2020 INDICATION: Patient with history of end-stage renal disease on dialysis via tunneled HD catheter placed 11/06/2019 by Dr. Earleen Newport. He now has a working AV graft and is no longer in need of his tunneled dialysis catheter. Request is made for removal. EXAM: REMOVAL TUNNELED CENTRAL VENOUS CATHETER MEDICATIONS: 10 mL 1% lidocaine ANESTHESIA/SEDATION: None FLUOROSCOPY TIME:  None COMPLICATIONS: None immediate. PROCEDURE: Informed written consent was obtained from the patient after a thorough discussion of the procedural risks, benefits and alternatives. All questions were addressed. Maximal Sterile Barrier Technique was utilized including caps, mask, sterile gowns, sterile gloves, sterile drape, hand hygiene and skin antiseptic. A timeout was performed prior to the initiation of the procedure. The patient's right chest and catheter was prepped and draped in a normal sterile fashion. Heparin was removed from both ports of catheter. 1% lidocaine was used for local anesthesia. Using gentle blunt dissection the cuff of the catheter was exposed and the catheter was removed in its entirety. Pressure was held till hemostasis was obtained. A sterile dressing was applied. The patient tolerated the procedure well with no immediate complications. IMPRESSION: Successful catheter removal as described above. Read by: Brynda Greathouse PA-C Electronically Signed   By: Jacqulynn Cadet M.D.   On: 06/01/2020 15:09   IR US Guide Vasc Access Left  Result Date: 06/06/2020 INDICATION: 60 year old Young with left axillary to axillary loop arteriovenous hemodialysis graft created 04/05/2020 presenting with graft thrombosis. EXAM: 1. Ultrasound-guided arteriovenous graft access x2 2.  Pharmacomechanical thrombolysis of arteriovenous graft 3. Rheolytic thrombectomy of arteriovenous graft 4. Balloon sweep thrombectomy of arterial anastomosis 5. Balloon angioplasty of arteriovenous graft COMPARISON:  None. MEDICATIONS: None. CONTRAST:  65m OMNIPAQUE IOHEXOL 300 MG/ML  SOLN ANESTHESIA/SEDATION: Moderate (conscious) sedation was employed during this procedure. A total of Versed 1.5 mg and Fentanyl 75 mcg was administered intravenously. Moderate Sedation Time: 99 minutes. The patient's level of consciousness and vital signs were monitored continuously by radiology nursing throughout the procedure under my direct supervision. FLUOROSCOPY TIME:  19 minutes, 18 seconds, 81 mGy COMPLICATIONS: None immediate. PROCEDURE: Informed written consent was obtained from the patient after a discussion of the  risk, benefits and alternatives to treatment. Questions regarding the procedure were encouraged and answered. A timeout was performed prior to the initiation of the procedure. The left arm dialysis graft was prepped with Chlorhexidine in a sterile fashion, and a sterile drape was applied covering the operative field. Ultrasound evaluation of the entire graft was performed. Antegrade access near the graft apex was performed under ultrasound guidance with a micropuncture set. Over a Wholey wire, a 6 French sheath was placed. A Glidewire and angled catheter were directed to the left subclavian vein. Pull-back venogram was performed. Pharmacomechanical thrombolysis was performed of the venous outflow portion of the graft. Retrograde access was then obtained under ultrasound guidance with a micropuncture set. Over a Wholey wire, a 6 French sheath was placed. A Glidewire and angled catheter were directed to the axillary artery. Pharmacomechanical thrombolysis was performed in the arterial limb of the graft. Using a Fogarty balloon, balloon sweep was performed of the arterial anastomosis 3 times. Rheolytic  thrombectomy was then performed throughout the arterial limb of the graft. This was followed by rheolytic therapy throughout the venous aspect of the graft. Fistulagram from the axillary artery demonstrated severe focal stenosis near the arterial anastomosis. Balloon angioplasty was performed at the stenotic site with a 4 mm x 2 cm mustang balloon. There is improved patency inflow after angioplasty. Additional moderate stenosis was noted in the graft apex. Balloon angioplasty with a 7 mm x 4 cm mustang balloon was performed. There are multifocal stenoses near the axillary vein anastomosis. Balloon angioplasty was performed multiple sites. Completion fistulagram was then performed. The sheaths was removed and hemostasis obtained with application of a 2-0 Ethilon pursestring suture which will be removed at the patient's next dialysis session. Dressings were placed. The patient tolerated the procedure well without immediate postprocedural complication. FINDINGS: Complete thrombosis of the left axillary to axillary loop arteriovenous graft. Technically successful pharmacomechanical thrombolysis and rheolytic thrombectomy throughout the graft. High-grade focal stenosis at the graft arterial anastomosis which improved after angioplasty. Additional angioplasty was required at the graft apex as well as the venous anastomosis. Self-limiting axillary hematoma was observed after wire cannulation cross the venous anastomosis. There is complete cessation of extravasation after balloon tamponade. Upon completion, there was palpable thrill throughout the graft and brisk antegrade flow on completion fistulogram. IMPRESSION: 1. Thrombosed left axillary to axillary loop arteriovenous graft. 2. Technically successful declot of graft with pharmacomechanical thrombolysis, rheolytic thrombectomy, Fogarty arterial anastomosis balloon sweep, and balloon angioplasty. ACCESS: This access remains amenable to future percutaneous interventions  as clinically indicated. Ruthann Cancer, MD [facility] Radiology Electronically Signed   By: Ruthann Cancer MD   On: 06/06/2020 17:42   IR THROMBECTOMY AV FISTULA W/THROMBOLYSIS/PTA/STENT INC SHUNT IMG LT  Result Date: 06/06/2020 INDICATION: 60 year old Young with left axillary to axillary loop arteriovenous hemodialysis graft created 04/05/2020 presenting with graft thrombosis. EXAM: 1. Ultrasound-guided arteriovenous graft access x2 2. Pharmacomechanical thrombolysis of arteriovenous graft 3. Rheolytic thrombectomy of arteriovenous graft 4. Balloon sweep thrombectomy of arterial anastomosis 5. Balloon angioplasty of arteriovenous graft COMPARISON:  None. MEDICATIONS: None. CONTRAST:  50m OMNIPAQUE IOHEXOL 300 MG/ML  SOLN ANESTHESIA/SEDATION: Moderate (conscious) sedation was employed during this procedure. A total of Versed 1.5 mg and Fentanyl 75 mcg was administered intravenously. Moderate Sedation Time: 99 minutes. The patient's level of consciousness and vital signs were monitored continuously by radiology nursing throughout the procedure under my direct supervision. FLUOROSCOPY TIME:  19 minutes, 18 seconds, 81 mGy COMPLICATIONS: None immediate.  PROCEDURE: Informed written consent was obtained from the patient after a discussion of the risk, benefits and alternatives to treatment. Questions regarding the procedure were encouraged and answered. A timeout was performed prior to the initiation of the procedure. The left arm dialysis graft was prepped with Chlorhexidine in a sterile fashion, and a sterile drape was applied covering the operative field. Ultrasound evaluation of the entire graft was performed. Antegrade access near the graft apex was performed under ultrasound guidance with a micropuncture set. Over a Wholey wire, a 6 French sheath was placed. A Glidewire and angled catheter were directed to the left subclavian vein. Pull-back venogram was  performed. Pharmacomechanical thrombolysis was performed of the venous outflow portion of the graft. Retrograde access was then obtained under ultrasound guidance with a micropuncture set. Over a Wholey wire, a 6 French sheath was placed. A Glidewire and angled catheter were directed to the axillary artery. Pharmacomechanical thrombolysis was performed in the arterial limb of the graft. Using a Fogarty balloon, balloon sweep was performed of the arterial anastomosis 3 times. Rheolytic thrombectomy was then performed throughout the arterial limb of the graft. This was followed by rheolytic therapy throughout the venous aspect of the graft. Fistulagram from the axillary artery demonstrated severe focal stenosis near the arterial anastomosis. Balloon angioplasty was performed at the stenotic site with a 4 mm x 2 cm mustang balloon. There is improved patency inflow after angioplasty. Additional moderate stenosis was noted in the graft apex. Balloon angioplasty with a 7 mm x 4 cm mustang balloon was performed. There are multifocal stenoses near the axillary vein anastomosis. Balloon angioplasty was performed multiple sites. Completion fistulagram was then performed. The sheaths was removed and hemostasis obtained with application of a 2-0 Ethilon pursestring suture which will be removed at the patient's next dialysis session. Dressings were placed. The patient tolerated the procedure well without immediate postprocedural complication. FINDINGS: Complete thrombosis of the left axillary to axillary loop arteriovenous graft. Technically successful pharmacomechanical thrombolysis and rheolytic thrombectomy throughout the graft. High-grade focal stenosis at the graft arterial anastomosis which improved after angioplasty. Additional angioplasty was required at the graft apex as well as the venous anastomosis. Self-limiting axillary hematoma was observed after wire cannulation cross the venous anastomosis. There is complete  cessation of extravasation after balloon tamponade. Upon completion, there was palpable thrill throughout the graft and brisk antegrade flow on completion fistulogram. IMPRESSION: 1. Thrombosed left axillary to axillary loop arteriovenous graft. 2. Technically successful declot of graft with pharmacomechanical thrombolysis, rheolytic thrombectomy, Fogarty arterial anastomosis balloon sweep, and balloon angioplasty. ACCESS: This access remains amenable to future percutaneous interventions as clinically indicated. Ruthann Cancer, MD [facility] Radiology Electronically Signed   By: Ruthann Cancer MD   On: 06/06/2020 17:42    Labs:  CBC: Recent Labs    04/14/20 1310 04/25/20 1644 05/17/20 1623 06/03/20 2004  WBC 7.2 6.9 7.2 5.7  HGB 9.3* 9.6* 11.5* 11.9*  HCT 31.4* 31.5* 38.1* 39.3  PLT 276 268 239 223    COAGS: Recent Labs    08/30/19 2152 11/05/19 1221  INR 1.0 1.0    BMP: Recent Labs    12/05/19 0722 12/08/19 0853 12/10/19 1202 12/12/19 0750 01/27/20 1000 04/14/20 1310 04/25/20 1644 05/17/20 1623 06/03/20 2004  NA 136 135 137 135   < > 137 138 140 134*  K 3.8 3.9 4.5 4.0   < > 3.8 4.3 3.3* 3.7  CL 96* 93* 98 91*   < >  102 103 103 101  CO2 '24 27 27 25   '$ < > '23 22 23 22  '$ GLUCOSE 303* 134* 155* 161*   < > 182* 145* 203* 270*  BUN 57* 81* 73* 64*   < > 26* 39* 47* 38*  CALCIUM 9.2 9.3 9.3 9.2   < > 8.6* 7.9* 9.2 7.8*  CREATININE 5.77* 6.89* 6.23* 7.16*   < > 3.73* 4.00* 3.87* 4.45*  GFRNONAA 10* 8* 9* 8*   < > 18* 17* 17* 15*  GFRAA 12* 9* 11* 9*  --   --   --   --   --    < > = values in this interval not displayed.    LIVER FUNCTION TESTS: Recent Labs    03/15/20 1349 03/16/20 1007 03/29/20 1831 04/14/20 1310  BILITOT 0.5 0.7 0.9 0.7  AST 10* 10* 17 12*  ALT '9 6 9 10  '$ ALKPHOS 87 92 100 100  PROT 7.3 7.2 7.6 7.4  ALBUMIN 2.6* 2.9* 2.7* 2.6*     Assessment and Plan:  60 y.o Young Zachary Young. History of ESRD  with LUE  axillary loop graft placed on 12.21.21; On 2.2.22 IR. The loop graft was found to be thrombosis and on 2.21.22 IR performed a thrombectomy. Upon completion the graft had a thrill. Patient was seen in dialysis today (2.22.22) where they were unable to access the graft. Patient presents for tunneled HD catheter for ongoing dialysis access.  Patient previously had a  LIJ X 2 with  The last HD catheter placed on 12.21.21 by vascular surgery that  was removed. Labs from 2.18.22 unremarkable. Allergies include Morphine, Oxycodone. Patient has been NPO since midnight.   Risks and benefits discussed with the patient including, but not limited to bleeding, infection, vascular injury, pneumothorax which may require chest tube placement, air embolism or even death  All of the patient's questions were answered, patient is agreeable to proceed. Consent signed and in chart.    Jacqualine Mau NP 06/07/2020 11:17 AM     Thank you for this interesting consult.  I greatly enjoyed meeting Yahya Rosenberg and look forward to participating in their care.  A copy of this report was sent to the requesting provider on this date.  Electronically Signed: Jacqualine Mau, NP 06/07/2020, 11:07 AM   I spent a total of  30 Minutes   in face to face in clinical consultation, greater than 50% of which was counseling/coordinating care for tunneled HD catheter placement

## 2020-06-07 NOTE — Progress Notes (Addendum)
[facility] Zachary Young called and informed  Blue clamps given to pt and explained how to use them if needed to cath voices understanding.  Discharge instructions reviewed with pt  Voice understanding.

## 2020-06-08 ENCOUNTER — Other Ambulatory Visit (HOSPITAL_COMMUNITY): Payer: Self-pay | Admitting: Interventional Radiology

## 2020-06-08 ENCOUNTER — Telehealth: Payer: Self-pay | Admitting: Neurology

## 2020-06-08 ENCOUNTER — Ambulatory Visit: Payer: Medicaid Other | Admitting: Neurology

## 2020-06-08 ENCOUNTER — Encounter: Payer: Self-pay | Admitting: Neurology

## 2020-06-08 VITALS — BP 178/90 | HR 94 | Ht 75.0 in | Wt 296.0 lb

## 2020-06-08 DIAGNOSIS — R569 Unspecified convulsions: Secondary | ICD-10-CM

## 2020-06-08 DIAGNOSIS — R55 Syncope and collapse: Secondary | ICD-10-CM

## 2020-06-08 DIAGNOSIS — T82898A Other specified complication of vascular prosthetic devices, implants and grafts, initial encounter: Secondary | ICD-10-CM

## 2020-06-08 DIAGNOSIS — T82868S Thrombosis of vascular prosthetic devices, implants and grafts, sequela: Secondary | ICD-10-CM

## 2020-06-08 MED ORDER — PHENYTOIN SODIUM EXTENDED 100 MG PO CAPS: 300.0000 mg | ORAL_CAPSULE | Freq: Every day | ORAL | 0 refills | Status: DC

## 2020-06-08 NOTE — Telephone Encounter (Signed)
[facility] [place]) called, we are getting faxes of referral, visit notes etc.  for this patient. He is not a patient with [facility]. We did have a referral years back, but was denied.   Contact info: [phone]

## 2020-06-08 NOTE — Progress Notes (Signed)
[facility] [address] [place]. [place] [address] [phone]       OFFICE CONSULT NOTE  [name] Date of Birth:  Dec 05, 1960 Medical Record Number:  [id number]   Referring MD: April Palombo  Reason for Referral: Seizure  HPI: Zachary Young is a 60 year old African-American male seen today for initial office consultation visit for possible seizure.  He is accompanied by his sister.  History is obtained from them and review of electronic medical records.  I have reviewed available imaging films in PACS.  He has past medical history of congestive heart failure, diabetes, renal failure, peripheral edema below-knee amputation.  Patient has had 2 episodes of brief loss of consciousness during dialysis.  The first 1 occurred on 05/17/2020 when apparently in dialysis he was found to be unresponsive and tremulous and shaking for 2 hours.  Patient did not have any memory of the episode.  EMS was called but patient refused care.  Subsequently managed to come to the ER but left AGAINST MEDICAL ADVICE before evaluation could be completed as he was fed up.  He had a similar episode on 05/26/2020 where again EMS had to be called in dialysis as he was unresponsive and apparently having a seizure.  Before he could be seen by neurology or ED physician he again became uncooperative and demanded to leave [place].  He refused to be treated for seizure and left.  Patient states he does not remember the episodes.  He does not have any warnings.  I do not have notes from dialysis suggestive whether he is blood pressure was significantly high or low or there was significant fluid removal on not.  Patient states he has no prior history of seizures.  He has not had no neurological problems in the past except he had a stroke in 2011 with which he had left hemibody numbness but he made a full neurological recovery  ROS:   14 system review of systems is positive for loss of  consciousness, tremulousness, shaking, tiredness, sleepiness all other systems negative  PMH:  Past Medical History:  Diagnosis Date  . CHF (congestive heart failure) ([place])   . Diabetes mellitus without complication ([place])   . Dialysis patient ([place])   . Peripheral edema   . Renal disorder    kidney disease    Social History:  Social History   Socioeconomic History  . Marital status: Single    Spouse name: Not on file  . Number of children: Not on file  . Years of education: Not on file  . Highest education level: Not on file  Occupational History  . Not on file  Tobacco Use  . Smoking status: Current Every Day Smoker    Packs/day: 0.50    Types: Cigarettes  . Smokeless tobacco: Never Used  . Tobacco comment: smoked this am  Vaping Use  . Vaping Use: Never used  Substance and Sexual Activity  . Alcohol use: Not Currently  . Drug use: Not Currently    Types: Cocaine    Comment: does not use  . Sexual activity: Not on file  Other Topics Concern  . Not on file  Social History Narrative   Lives with friend   Right Handed   Drinks no caffeine   Social Determinants of Health   Financial Resource Strain: Not on file  Food Insecurity: Not on file  Transportation Needs: Not on file  Physical Activity: Not on file  Stress: Not on file  Social Connections: Not on file  Intimate Partner Violence: Not on file    Medications:   Current Outpatient Medications on File Prior to Visit  Medication Sig Dispense Refill  . aspirin EC 81 MG EC tablet Take 1 tablet (81 mg total) by mouth daily. 30 tablet 0  . ergocalciferol (VITAMIN D2) 1.25 MG (50000 UT) capsule Take 1 capsule (50,000 Units total) by mouth every Friday. 30 capsule 1  . furosemide (LASIX) 80 MG tablet Take by mouth 2 (two) times daily.    . insulin aspart (NOVOLOG) 100 UNIT/ML injection Inject 10 Units into the skin 3 (three) times daily with meals. (Patient taking differently: Inject 4 Units into the skin 3  (three) times daily with meals.) 10 mL 11  . insulin glargine (LANTUS) 100 UNIT/ML injection Inject 0.35 mLs (35 Units total) into the skin at bedtime. (Patient taking differently: Inject 25 Units into the skin at bedtime.) 10 mL 11  . Insulin Pen Needle (PEN NEEDLES 31GX5/16") 31G X 8 MM MISC 1 each by Other route daily. 90 each 2  . midodrine (PROAMATINE) 10 MG tablet Take 1 tablet (10 mg total) by mouth 3 (three) times daily with meals. 180 tablet 2  . pregabalin (LYRICA) 50 MG capsule Take 1 capsule (50 mg total) by mouth 2 (two) times daily. 60 capsule 2  . albuterol (VENTOLIN HFA) 108 (90 Base) MCG/ACT inhaler Inhale 1 puff into the lungs every 4 (four) hours as needed for wheezing or shortness of breath. 6.7 g 0  . cephALEXin (KEFLEX) 250 MG capsule Take 1 capsule (250 mg total) by mouth 2 (two) times daily. 14 capsule 0  . HYDROcodone-acetaminophen (NORCO) 5-325 MG tablet Take 1 tablet by mouth every 6 (six) hours as needed for moderate pain. 15 tablet 0   No current facility-administered medications on file prior to visit.    Allergies:   Allergies  Allergen Reactions  . Bee Venom Anaphylaxis    Per pt, nearly died from bee sting as a child  . Propofol Other (See Comments)     Hallucinations  . Morphine Nausea And Vomiting  . Oxycodone Nausea And Vomiting    Physical Exam General: Obese middle-aged African-American male seated, in no evident distress Head: head normocephalic and atraumatic.   Neck: supple with no carotid or supraclavicular bruits Cardiovascular: regular rate and rhythm, soft ejection systolic murmur. Musculoskeletal: no deformity he has right below-knee amputation. Skin:  no rash/petichiae he has right subclavian dialysis catheter. Vascular:  Normal pulses all extremities pedal edema noted on the left leg  Neurologic Exam Mental Status: Awake and fully alert. Oriented to place and time. Recent and remote memory intact. Attention span, concentration and fund  of knowledge appropriate. Mood and affect appropriate.  Cranial Nerves: Fundoscopic exam reveals sharp disc margins. Pupils equal, briskly reactive to light. Extraocular movements full without nystagmus. Visual fields full to confrontation. Hearing intact. Facial sensation intact. Face, tongue, palate moves normally and symmetrically.  Motor: Normal bulk and tone. Normal strength in all tested extremity muscles. Sensory.:  Diminished  touch , pinprick , position and vibratory sensation from ankle down bilaterally.  Coordination: Rapid alternating movements normal in all extremities. Finger-to-nose and heel-to-shin performed accurately bilaterally. Gait and Station: Deferred as patient is nonambulatory and sitting in a wheelchair. Reflexes: 1+ and symmetric except right ankle that cannot be elicited due to amputation.. Toes downgoing.     ASSESSMENT: 60 year old African-American male with 2 episodes of brief loss of consciousness and jerking during  dialysis 2 weeks ago possibly convulsive syncope versus seizures.     PLAN: I had a long discussion with the patient and his sister regarding his two episodes of brief loss of consciousness and jerking during dialysis being possible seizures versus convulsive syncope.  I would recommend further evaluation with checking MRI scan of the brain, MRA of the brain, EEG, carotid and transcranial Doppler studies.  I would recommend a trial of anticonvulsants given the fact that he has remote history of stroke and has had two episodes of possible seizures prevention of further episodes.  Recommend Dilantin 300 mg daily.  He was advised not to drive as per [facility].  He will return for follow-up in the future in 2 months or call earlier if necessary.  Greater than 50% time during this 45-minute consultation visit was spent on counseling and coordination of care about his episodes of brief loss of consciousness and convulsive syncope versus seizure and  answering questions. Antony Contras, MD  [facility] [address] [place], [place] [address]  Phone [phone] Fax [phone] Note: This document was prepared with digital dictation and possible smart phrase technology. Any transcriptional errors that result from this process are unintentional.

## 2020-06-08 NOTE — Patient Instructions (Signed)
I had a long discussion with the patient and his sister regarding his two episodes of brief loss of consciousness and jerking during dialysis being possible seizures versus convulsive syncope.  I would recommend further evaluation with checking MRI scan of the brain, MRA of the brain, EEG, carotid and transcranial Doppler studies.  I would recommend a trial of anticonvulsants given the fact that he has remote history of stroke and has had two episodes of possible seizures prevention of further episodes.  Recommend Dilantin 300 mg daily.  He was advised not to drive as per [facility].  He will return for follow-up in the future in 2 months or call earlier if necessary.  Seizure, Adult A seizure is a sudden burst of abnormal electrical and chemical activity in the brain. Seizures usually last from 30 seconds to 2 minutes.  What are the causes? Common causes of this condition include:  Fever or infection.  Problems that affect the brain. These may include: ? A brain or head injury. ? Bleeding in the brain. ? A brain tumor.  Low levels of blood sugar or salt.  Kidney problems or liver problems.  Conditions that are passed from parent to child (are inherited).  Problems with a substance, such as: ? Having a reaction to a drug or a medicine. ? Stopping the use of a substance all of a sudden (withdrawal).  A stroke.  Disorders that affect how you develop. Sometimes, the cause may not be known.  What increases the risk?  Having someone in your family who has epilepsy. In this condition, seizures happen again and again over time. They have no clear cause.  Having had a tonic-clonic seizure before. This type of seizure causes you to: ? Tighten the muscles of the whole body. ? Lose consciousness.  Having had a head injury or strokes before.  Having had a lack of oxygen at birth. What are the signs or symptoms? There are many types of seizures. The symptoms vary depending on the  type of seizure you have. Symptoms during a seizure  Shaking that you cannot control (convulsions) with fast, jerky movements of muscles.  Stiffness of the body.  Breathing problems.  Feeling mixed up (confused).  Staring or not responding to sound or touch.  Head nodding.  Eyes that blink, flutter, or move fast.  Drooling, grunting, or making clicking sounds with your mouth  Losing control of when you pee or poop. Symptoms before a seizure  Feeling afraid, nervous, or worried.  Feeling like you may vomit.  Feeling like: ? You are moving when you are not. ? Things around you are moving when they are not.  Feeling like you saw or heard something before (dj vu).  Odd tastes or smells.  Changes in how you see. You may see flashing lights or spots. Symptoms after a seizure  Feeling confused.  Feeling sleepy.  Headache.  Sore muscles. How is this treated? If your seizure stops on its own, you will not need treatment. If your seizure lasts longer than 5 minutes, you will normally need treatment. Treatment may include:  Medicines given through an IV tube.  Avoiding things, such as medicines, that are known to cause your seizures.  Medicines to prevent seizures.  A device to prevent or control seizures.  Surgery.  A diet low in carbohydrates and high in fat (ketogenic diet). Follow these instructions at home: Medicines  Take over-the-counter and prescription medicines only as told by your doctor.  Avoid  foods or drinks that may keep your medicine from working, such as alcohol. Activity  Follow instructions about driving, swimming, or doing things that would be dangerous if you had another seizure. Wait until your doctor says it is safe for you to do these things.  If you live in the [place] ask your local department of motor vehicles when you can drive.  Get a lot of rest. Teaching others  Teach friends and family what to do when you have a seizure.  They should: ? Help you get down to the ground. ? Protect your head and body. ? Loosen any clothing around your neck. ? Turn you on your side. ? Know whether or not you need emergency care. ? Stay with you until you are better.  Also, tell them what not to do if you have a seizure. Tell them: ? They should not hold you down. ? They should not put anything in your mouth.   General instructions  Avoid anything that gives you seizures.  Keep a seizure diary. Write down: ? What you remember about each seizure. ? What you think caused each seizure.  Keep all follow-up visits. Contact a doctor if:  You have another seizure or seizures. Call the doctor each time you have a seizure.  The pattern of your seizures changes.  You keep having seizures with treatment.  You have symptoms of being sick or having an infection.  You are not able to take your medicine. Get help right away if:  You have any of these problems: ? A seizure that lasts longer than 5 minutes. ? Many seizures in a row and you do not feel better between seizures. ? A seizure that makes it harder to breathe. ? A seizure and you can no longer speak or use part of your body.  You do not wake up right after a seizure.  You get hurt during a seizure.  You feel confused or have pain right after a seizure. These symptoms may be an emergency. Get help right away. Call your local emergency services (911 in the [place]  Do not wait to see if the symptoms will go away.  Do not drive yourself to the hospital. Summary  A seizure is a sudden burst of abnormal electrical and chemical activity in the brain. Seizures normally last from 30 seconds to 2 minutes.  Causes of seizures include illness, injury to the head, low levels of blood sugar or salt, and certain conditions.  Most seizures will stop on their own in less than 5 minutes. Seizures that last longer than 5 minutes are a medical emergency and need treatment right  away.  Many medicines are used to treat seizures. Take over-the-counter and prescription medicines only as told by your doctor. This information is not intended to replace advice given to you by your health care provider. Make sure you discuss any questions you have with your health care provider. Document Revised: 10/09/2019 Document Reviewed: 10/09/2019 Elsevier Patient Education  [place].

## 2020-06-09 ENCOUNTER — Telehealth: Payer: Self-pay

## 2020-06-09 ENCOUNTER — Telehealth: Payer: Self-pay | Admitting: Neurology

## 2020-06-09 NOTE — Telephone Encounter (Signed)
mcd [place] complete pending faxed notes

## 2020-06-09 NOTE — Telephone Encounter (Signed)
Called patient to confirm who his PCP is to have future visit notes and care information sent to.  Stated he normally just sees Dr. Perrin Smack at [facility].    Updated accordingly

## 2020-06-09 NOTE — Telephone Encounter (Signed)
Call from Ramiro Harvest, renal group PA to let us know pt needs to be seen, as his L AVG place in 03/2020 is possibly clotted. Pt had a declot this week with IR and it was possibly clotted again after the declot. MD has been made aware. Pt has been scheduled for an office visit on Monday with MD and is aware of this appt date, time and location. No questions or concerns at this time.

## 2020-06-12 ENCOUNTER — Emergency Department (HOSPITAL_COMMUNITY): Payer: Medicaid Other

## 2020-06-12 ENCOUNTER — Encounter (HOSPITAL_COMMUNITY): Payer: Self-pay | Admitting: Emergency Medicine

## 2020-06-12 ENCOUNTER — Other Ambulatory Visit: Payer: Self-pay

## 2020-06-12 ENCOUNTER — Inpatient Hospital Stay (HOSPITAL_COMMUNITY)
Admission: EM | Admit: 2020-06-12 | Discharge: 2020-06-13 | DRG: 438 | Discharge status: Against Medical Advice | Payer: Medicaid Other | Attending: Internal Medicine | Admitting: Internal Medicine

## 2020-06-12 DIAGNOSIS — K920 Hematemesis: Secondary | ICD-10-CM | POA: Diagnosis present

## 2020-06-12 DIAGNOSIS — Z6837 Body mass index (BMI) 37.0-37.9, adult: Secondary | ICD-10-CM

## 2020-06-12 DIAGNOSIS — Z5329 Procedure and treatment not carried out because of patient's decision for other reasons: Secondary | ICD-10-CM | POA: Diagnosis present

## 2020-06-12 DIAGNOSIS — F17210 Nicotine dependence, cigarettes, uncomplicated: Secondary | ICD-10-CM | POA: Diagnosis present

## 2020-06-12 DIAGNOSIS — E871 Hypo-osmolality and hyponatremia: Secondary | ICD-10-CM | POA: Diagnosis present

## 2020-06-12 DIAGNOSIS — F17200 Nicotine dependence, unspecified, uncomplicated: Secondary | ICD-10-CM | POA: Diagnosis present

## 2020-06-12 DIAGNOSIS — Z885 Allergy status to narcotic agent status: Secondary | ICD-10-CM

## 2020-06-12 DIAGNOSIS — Z91030 Bee allergy status: Secondary | ICD-10-CM | POA: Diagnosis not present

## 2020-06-12 DIAGNOSIS — Z7982 Long term (current) use of aspirin: Secondary | ICD-10-CM

## 2020-06-12 DIAGNOSIS — N186 End stage renal disease: Secondary | ICD-10-CM

## 2020-06-12 DIAGNOSIS — E1165 Type 2 diabetes mellitus with hyperglycemia: Secondary | ICD-10-CM | POA: Diagnosis present

## 2020-06-12 DIAGNOSIS — K922 Gastrointestinal hemorrhage, unspecified: Secondary | ICD-10-CM | POA: Diagnosis not present

## 2020-06-12 DIAGNOSIS — Z992 Dependence on renal dialysis: Secondary | ICD-10-CM

## 2020-06-12 DIAGNOSIS — K2970 Gastritis, unspecified, without bleeding: Secondary | ICD-10-CM | POA: Diagnosis present

## 2020-06-12 DIAGNOSIS — Z20822 Contact with and (suspected) exposure to covid-19: Secondary | ICD-10-CM | POA: Diagnosis present

## 2020-06-12 DIAGNOSIS — E1122 Type 2 diabetes mellitus with diabetic chronic kidney disease: Secondary | ICD-10-CM | POA: Diagnosis present

## 2020-06-12 DIAGNOSIS — Z89511 Acquired absence of right leg below knee: Secondary | ICD-10-CM

## 2020-06-12 DIAGNOSIS — Z809 Family history of malignant neoplasm, unspecified: Secondary | ICD-10-CM

## 2020-06-12 DIAGNOSIS — Z794 Long term (current) use of insulin: Secondary | ICD-10-CM

## 2020-06-12 DIAGNOSIS — I5042 Chronic combined systolic (congestive) and diastolic (congestive) heart failure: Secondary | ICD-10-CM | POA: Diagnosis present

## 2020-06-12 DIAGNOSIS — E1169 Type 2 diabetes mellitus with other specified complication: Secondary | ICD-10-CM | POA: Diagnosis present

## 2020-06-12 DIAGNOSIS — E1140 Type 2 diabetes mellitus with diabetic neuropathy, unspecified: Secondary | ICD-10-CM | POA: Diagnosis present

## 2020-06-12 DIAGNOSIS — K219 Gastro-esophageal reflux disease without esophagitis: Secondary | ICD-10-CM | POA: Diagnosis present

## 2020-06-12 DIAGNOSIS — R9431 Abnormal electrocardiogram [ECG] [EKG]: Secondary | ICD-10-CM

## 2020-06-12 DIAGNOSIS — R197 Diarrhea, unspecified: Secondary | ICD-10-CM | POA: Diagnosis present

## 2020-06-12 DIAGNOSIS — R1084 Generalized abdominal pain: Secondary | ICD-10-CM | POA: Diagnosis not present

## 2020-06-12 DIAGNOSIS — I110 Hypertensive heart disease with heart failure: Secondary | ICD-10-CM | POA: Diagnosis present

## 2020-06-12 DIAGNOSIS — E8809 Other disorders of plasma-protein metabolism, not elsewhere classified: Secondary | ICD-10-CM | POA: Diagnosis present

## 2020-06-12 DIAGNOSIS — Z888 Allergy status to other drugs, medicaments and biological substances status: Secondary | ICD-10-CM | POA: Diagnosis not present

## 2020-06-12 DIAGNOSIS — R109 Unspecified abdominal pain: Secondary | ICD-10-CM

## 2020-06-12 DIAGNOSIS — I10 Essential (primary) hypertension: Secondary | ICD-10-CM | POA: Diagnosis not present

## 2020-06-12 DIAGNOSIS — R1110 Vomiting, unspecified: Secondary | ICD-10-CM

## 2020-06-12 DIAGNOSIS — Z79899 Other long term (current) drug therapy: Secondary | ICD-10-CM

## 2020-06-12 DIAGNOSIS — I132 Hypertensive heart and chronic kidney disease with heart failure and with stage 5 chronic kidney disease, or end stage renal disease: Secondary | ICD-10-CM | POA: Diagnosis present

## 2020-06-12 DIAGNOSIS — R112 Nausea with vomiting, unspecified: Secondary | ICD-10-CM

## 2020-06-12 DIAGNOSIS — Z9049 Acquired absence of other specified parts of digestive tract: Secondary | ICD-10-CM

## 2020-06-12 DIAGNOSIS — E669 Obesity, unspecified: Secondary | ICD-10-CM | POA: Diagnosis present

## 2020-06-12 DIAGNOSIS — R748 Abnormal levels of other serum enzymes: Secondary | ICD-10-CM

## 2020-06-12 DIAGNOSIS — K859 Acute pancreatitis without necrosis or infection, unspecified: Secondary | ICD-10-CM | POA: Diagnosis present

## 2020-06-12 DIAGNOSIS — K8500 Idiopathic acute pancreatitis without necrosis or infection: Secondary | ICD-10-CM | POA: Diagnosis not present

## 2020-06-12 LAB — COMPREHENSIVE METABOLIC PANEL
ALT: 8 U/L (ref 0–44)
AST: 12 U/L — ABNORMAL LOW (ref 15–41)
Albumin: 3 g/dL — ABNORMAL LOW (ref 3.5–5.0)
Alkaline Phosphatase: 99 U/L (ref 38–126)
Anion gap: 5 (ref 5–15)
BUN: 29 mg/dL — ABNORMAL HIGH (ref 6–20)
CO2: 26 mmol/L (ref 22–32)
Calcium: 8.4 mg/dL — ABNORMAL LOW (ref 8.9–10.3)
Chloride: 96 mmol/L — ABNORMAL LOW (ref 98–111)
Creatinine, Ser: 4 mg/dL — ABNORMAL HIGH (ref 0.61–1.24)
GFR, Estimated: 17 mL/min — ABNORMAL LOW (ref >60)
Glucose, Bld: 419 mg/dL — ABNORMAL HIGH (ref 70–99)
Potassium: 4.1 mmol/L (ref 3.5–5.1)
Sodium: 127 mmol/L — ABNORMAL LOW (ref 135–145)
Total Bilirubin: 0.4 mg/dL (ref 0.3–1.2)
Total Protein: 7.9 g/dL (ref 6.5–8.1)

## 2020-06-12 LAB — CBC WITH DIFFERENTIAL/PLATELET
Abs Immature Granulocytes: 0.01 10*3/uL (ref 0.00–0.07)
Basophils Absolute: 0 10*3/uL (ref 0.0–0.1)
Basophils Relative: 1 %
Eosinophils Absolute: 0.1 10*3/uL (ref 0.0–0.5)
Eosinophils Relative: 1 %
HCT: 39.9 % (ref 39.0–52.0)
Hemoglobin: 12.3 g/dL — ABNORMAL LOW (ref 13.0–17.0)
Immature Granulocytes: 0 %
Lymphocytes Relative: 23 %
Lymphs Abs: 1.8 10*3/uL (ref 0.7–4.0)
MCH: 26.2 pg (ref 26.0–34.0)
MCHC: 30.8 g/dL (ref 30.0–36.0)
MCV: 84.9 fL (ref 80.0–100.0)
Monocytes Absolute: 0.5 10*3/uL (ref 0.1–1.0)
Monocytes Relative: 7 %
Neutro Abs: 5.5 10*3/uL (ref 1.7–7.7)
Neutrophils Relative %: 68 %
Platelets: 320 10*3/uL (ref 150–400)
RBC: 4.7 MIL/uL (ref 4.22–5.81)
RDW: 18.7 % — ABNORMAL HIGH (ref 11.5–15.5)
WBC: 7.9 10*3/uL (ref 4.0–10.5)
nRBC: 0 % (ref 0.0–0.2)

## 2020-06-12 LAB — POC OCCULT BLOOD, ED: Fecal Occult Bld: NEGATIVE

## 2020-06-12 LAB — LIPASE, BLOOD: Lipase: 780 U/L — ABNORMAL HIGH (ref 11–51)

## 2020-06-12 LAB — PROTIME-INR
INR: 1 (ref 0.8–1.2)
Prothrombin Time: 12.7 seconds (ref 11.4–15.2)

## 2020-06-12 LAB — MAGNESIUM: Magnesium: 1.8 mg/dL (ref 1.7–2.4)

## 2020-06-12 MED ORDER — HYDROMORPHONE HCL 1 MG/ML IJ SOLN
INTRAMUSCULAR | Status: AC
  Administered 2020-06-12: 1 mg via INTRAVENOUS
  Filled 2020-06-12: qty 1

## 2020-06-12 MED ORDER — ONDANSETRON HCL 4 MG/2ML IJ SOLN
4.0000 mg | Freq: Once | INTRAMUSCULAR | Status: AC
  Administered 2020-06-12: 4 mg via INTRAVENOUS
  Filled 2020-06-12: qty 2

## 2020-06-12 MED ORDER — DEXTROSE-NACL 5-0.9 % IV SOLN: INTRAVENOUS | Status: DC

## 2020-06-12 MED ORDER — IOHEXOL 300 MG/ML  SOLN
100.0000 mL | Freq: Once | INTRAMUSCULAR | Status: AC | PRN
  Administered 2020-06-12: 100 mL via INTRAVENOUS

## 2020-06-12 MED ORDER — PANTOPRAZOLE SODIUM 40 MG IV SOLR
40.0000 mg | Freq: Once | INTRAVENOUS | Status: AC
  Administered 2020-06-12: 40 mg via INTRAVENOUS
  Filled 2020-06-12: qty 40

## 2020-06-12 MED ORDER — HYDROMORPHONE HCL 1 MG/ML IJ SOLN
1.0000 mg | Freq: Once | INTRAMUSCULAR | Status: AC
  Administered 2020-06-12: 1 mg via INTRAVENOUS
  Filled 2020-06-12: qty 1

## 2020-06-12 MED ORDER — HYDROMORPHONE HCL 1 MG/ML IJ SOLN
1.0000 mg | Freq: Once | INTRAMUSCULAR | Status: AC
  Filled 2020-06-12: qty 1

## 2020-06-12 NOTE — H&P (Incomplete)
History and Physical  Zachary Young [id number] DOB: [birth year] DOA: 06/12/2020  Referring physician: Hayden Rasmussen, MD PCP: Pcp, No  Patient coming from: Home  Chief Complaint: Vomiting and Diarrhea  HPI: Zachary Young is a 60 y.o. male with medical history significant for ESRD on HD (TTS), T2DM,  chronic combined CHF, HTN, diabetic neuropathy  who presents to the ED      ED Course: ***  Review of Systems: Review of systems as noted in the HPI. All other systems reviewed and are negative.   Past Medical History:  Diagnosis Date  . CHF (congestive heart failure) ([place])   . Diabetes mellitus without complication ([place])   . Dialysis patient ([place])   . Peripheral edema   . Renal disorder    kidney disease   Past Surgical History:  Procedure Laterality Date  . AMPUTATION Right 10/21/2019   Procedure: RIGHT BELOW KNEE AMPUTATION;  Surgeon: Newt Minion, MD;  Location: [place];  Service: Orthopedics;  Laterality: Right;  . AV FISTULA PLACEMENT Left 11/13/2019   Procedure: LEFT ARTERIOVENOUS (AV) FISTULA VERSUS ARTERIOVENOUS GRAFT;  Surgeon: Rosetta Posner, MD;  Location: [facility] OR;  Service: Vascular;  Laterality: Left;  . AV FISTULA PLACEMENT Left 04/05/2020   Procedure: INSERTION OF LEFT ARM ARTERIOVENOUS (AV) GORE-TEX GRAFT;  Surgeon: Rosetta Posner, MD;  Location: [place];  Service: Vascular;  Laterality: Left;  . BASCILIC VEIN TRANSPOSITION Left 01/27/2020   Procedure: LEFT ARM 2ND STAGE BASCILIC VEIN TRANSPOSITION;  Surgeon: Rosetta Posner, MD;  Location: [place];  Service: Vascular;  Laterality: Left;  . DIALYSIS/PERMA CATHETER INSERTION N/A 03/03/2020   Procedure: DIALYSIS/PERMA CATHETER INSERTION;  Surgeon: Algernon Huxley, MD;  Location: [facility] CV LAB;  Service: Cardiovascular;  Laterality: N/A;  . INSERTION OF DIALYSIS CATHETER N/A 04/05/2020   Procedure: INSERTION OF TUNNEL  DIALYSIS CATHETER LEFT INTERNAL JUGULAR;  Surgeon: Rosetta Posner, MD;  Location: [place];  Service:  Vascular;  Laterality: N/A;  . IR FLUORO GUIDE CV LINE RIGHT  11/05/2019  . IR FLUORO GUIDE CV LINE RIGHT  06/07/2020  . IR REMOVAL TUN CV CATH W/O FL  06/01/2020  . IR THROMBECTOMY AV FISTULA W/THROMBOLYSIS/PTA INC/SHUNT/IMG LEFT Left 06/06/2020  . IR US GUIDE VASC ACCESS LEFT  06/06/2020  . IR US GUIDE VASC ACCESS RIGHT  11/05/2019  . IR US GUIDE VASC ACCESS RIGHT  06/07/2020  . REMOVAL OF A DIALYSIS CATHETER Right 04/05/2020   Procedure: REMOVAL OF RIGHT CHEST DIALYSIS CATHETER;  Surgeon: Rosetta Posner, MD;  Location: [place] [facility];  Service: Vascular;  Laterality: Right;  . TOE AMPUTATION Bilateral    due to osteomyelitis, all 5 each foot    Social History:  reports that he has been smoking cigarettes. He has been smoking about 0.50 packs per day. He has never used smokeless tobacco. He reports previous alcohol use. He reports previous drug use. Drug: Cocaine.   Allergies  Allergen Reactions  . Bee Venom Anaphylaxis    Per pt, nearly died from bee sting as a child  . Propofol Other (See Comments)     Hallucinations  . Morphine Nausea And Vomiting  . Oxycodone Nausea And Vomiting    Family History  Problem Relation Age of Onset  . Cancer Mother     ***  Prior to Admission medications   Medication Sig Start Date End Date Taking? Authorizing Provider  albuterol (VENTOLIN HFA) 108 (90 Base) MCG/ACT inhaler Inhale 1 puff into the lungs every  4 (four) hours as needed for wheezing or shortness of breath. 08/20/19  Yes Gladys Damme, MD  aspirin EC 81 MG EC tablet Take 1 tablet (81 mg total) by mouth daily. 08/21/19  Yes Gladys Damme, MD  ergocalciferol (VITAMIN D2) 1.25 MG (50000 UT) capsule Take 1 capsule (50,000 Units total) by mouth every Friday. 12/11/19  Yes Samella Parr, NP  furosemide (LASIX) 40 MG tablet Take 40 mg by mouth 2 (two) times daily. 05/21/20  Yes [provider]  insulin glargine (LANTUS) 100 UNIT/ML injection Inject 0.35 mLs (35 Units total) into the skin at  bedtime. Patient taking differently: Inject 25 Units into the skin at bedtime. 12/11/19  Yes Samella Parr, NP  Insulin Pen Needle (PEN NEEDLES 31GX5/16") 31G X 8 MM MISC 1 each by Other route daily. 12/11/19 12/10/20 Yes Samella Parr, NP  midodrine (PROAMATINE) 10 MG tablet Take 1 tablet (10 mg total) by mouth 3 (three) times daily with meals. 12/11/19  Yes Samella Parr, NP  NOVOLOG FLEXPEN 100 UNIT/ML FlexPen Inject 10 Units into the skin 3 (three) times daily. 05/24/20  Yes [provider]  phenytoin (DILANTIN) 100 MG ER capsule Take 3 capsules (300 mg total) by mouth at bedtime. 06/08/20  Yes Garvin Fila, MD  pregabalin (LYRICA) 50 MG capsule Take 1 capsule (50 mg total) by mouth 2 (two) times daily. 12/11/19  Yes Samella Parr, NP  torsemide (DEMADEX) 100 MG tablet Take 100 mg by mouth daily. On Monday-Wednesday-Friday-Sunday (non-dialysis days) 04/29/20  Yes [provider]    Physical Exam: BP (!) 161/90   Pulse 83   Temp 98.4 F (36.9 C) (Oral)   Resp 16   Ht '6\' 3"'$  (1.905 m)   Wt 134.3 kg   SpO2 96%   BMI 37.00 kg/m   . General: 60 y.o. year-old male well developed well nourished in no acute distress.  Alert and oriented x3. . Cardiovascular: Regular rate and rhythm with no rubs or gallops.  No thyromegaly or JVD noted.  No lower extremity edema. 2/4 pulses in all 4 extremities. Marland Kitchen Respiratory: Clear to auscultation with no wheezes or rales. Good inspiratory effort. . Abdomen: Soft nontender nondistended with normal bowel sounds x4 quadrants. . Muskuloskeletal: No cyanosis, clubbing or edema noted bilaterally . Neuro: CN II-XII intact, strength, sensation, reflexes . Skin: No ulcerative lesions noted or rashes . Psychiatry: Judgement and insight appear normal. Mood is appropriate for condition and setting          Labs on Admission:  Basic Metabolic Panel: Recent Labs  Lab 06/12/20 2015  NA 127*  K 4.1  CL 96*  CO2 26  GLUCOSE 419*  BUN 29*   CREATININE 4.00*  CALCIUM 8.4*  MG 1.8   Liver Function Tests: Recent Labs  Lab 06/12/20 2015  AST 12*  ALT 8  ALKPHOS 99  BILITOT 0.4  PROT 7.9  ALBUMIN 3.0*   Recent Labs  Lab 06/12/20 1952  LIPASE 780*   No results for input(s): AMMONIA in the last 168 hours. CBC: Recent Labs  Lab 06/12/20 2015  WBC 7.9  NEUTROABS 5.5  HGB 12.3*  HCT 39.9  MCV 84.9  PLT 320   Cardiac Enzymes: No results for input(s): CKTOTAL, CKMB, CKMBINDEX, TROPONINI in the last 168 hours.  BNP (last 3 results) Recent Labs    08/15/19 0944 08/30/19 2152  BNP 148.2* 48.1    ProBNP (last 3 results) No results for input(s): PROBNP in  the last 8760 hours.  CBG: Recent Labs  Lab 06/06/20 1228 06/06/20 1721 06/07/20 1254  GLUCAP 350* 208* 338*    Radiological Exams on Admission: CT ABDOMEN PELVIS WO CONTRAST  Result Date: 06/12/2020 CLINICAL DATA:  Acute nonlocalized abdominal pain. Vomiting. Renal failure. Dialysis patient. EXAM: CT ABDOMEN AND PELVIS WITHOUT CONTRAST TECHNIQUE: Multidetector CT imaging of the abdomen and pelvis was performed following the standard protocol without IV contrast. COMPARISON:  12/14/2018 FINDINGS: Lower chest: Interstitial and early alveolar pattern in the lower lungs consistent with heart failure or fluid overload. No consolidation or collapse. No pleural effusion. Hepatobiliary: Liver parenchyma appears normal without contrast. Previous cholecystectomy. Pancreas: There appears to be mild swelling with surrounding edema in the region of the body of the pancreas suggesting early or mild pancreatitis. Spleen: Normal Adrenals/Urinary Tract: Adrenal glands are normal. Kidneys are normal. Bladder is normal. Stomach/Bowel: Stomach and small intestine are normal. Normal appendix. No colon abnormality seen. Vascular/Lymphatic: Aortic atherosclerosis. No aneurysm. IVC is normal. No adenopathy. Reproductive: Normal Other: No free fluid or air. Musculoskeletal: Right  inguinal hernia containing fat. Mild lower lumbar degenerative changes. IMPRESSION: Lung bases show interstitial and early alveolar edema consistent with fluid overload. Mild swelling of the body of the pancreas with mild surrounding edema consistent with early/mild pancreatitis. Electronically Signed   By: Nelson Chimes M.D.   On: 06/12/2020 21:56    EKG: I independently viewed the EKG done and my findings are as followed: ***   Assessment/Plan Present on Admission: . Acute pancreatitis  Active Problems:   Acute pancreatitis   DVT prophylaxis: ***   Code Status: ***   Family Communication: ***   Disposition Plan: ***   Consults called: ***   Admission status: ***     Bernadette Hoit MD [facility] Pager [phone]  If 7PM-7AM, please contact night-coverage [email or web address] Password [facility]  06/12/2020, 11:14 PM
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
## 2020-06-12 NOTE — ED Triage Notes (Signed)
Pt c/o emesis for past 7 days and diarrhea for the past 4 days.

## 2020-06-12 NOTE — ED Notes (Signed)
Pt is inpatient status, vital signs will be completed accordingly.

## 2020-06-13 ENCOUNTER — Encounter (HOSPITAL_COMMUNITY): Payer: Self-pay | Admitting: Internal Medicine

## 2020-06-13 ENCOUNTER — Ambulatory Visit: Payer: Medicaid Other | Admitting: Vascular Surgery

## 2020-06-13 ENCOUNTER — Other Ambulatory Visit: Payer: Self-pay

## 2020-06-13 ENCOUNTER — Telehealth: Payer: Self-pay | Admitting: Gastroenterology

## 2020-06-13 ENCOUNTER — Inpatient Hospital Stay (HOSPITAL_COMMUNITY): Payer: Medicaid Other

## 2020-06-13 ENCOUNTER — Encounter: Payer: Self-pay | Admitting: Gastroenterology

## 2020-06-13 DIAGNOSIS — E871 Hypo-osmolality and hyponatremia: Secondary | ICD-10-CM

## 2020-06-13 DIAGNOSIS — R1110 Vomiting, unspecified: Secondary | ICD-10-CM

## 2020-06-13 DIAGNOSIS — K859 Acute pancreatitis without necrosis or infection, unspecified: Principal | ICD-10-CM

## 2020-06-13 DIAGNOSIS — R197 Diarrhea, unspecified: Secondary | ICD-10-CM

## 2020-06-13 DIAGNOSIS — R9431 Abnormal electrocardiogram [ECG] [EKG]: Secondary | ICD-10-CM

## 2020-06-13 DIAGNOSIS — R748 Abnormal levels of other serum enzymes: Secondary | ICD-10-CM

## 2020-06-13 DIAGNOSIS — E1165 Type 2 diabetes mellitus with hyperglycemia: Secondary | ICD-10-CM

## 2020-06-13 LAB — CBC
HCT: 42.1 % (ref 39.0–52.0)
Hemoglobin: 12.5 g/dL — ABNORMAL LOW (ref 13.0–17.0)
MCH: 25.8 pg — ABNORMAL LOW (ref 26.0–34.0)
MCHC: 29.7 g/dL — ABNORMAL LOW (ref 30.0–36.0)
MCV: 86.8 fL (ref 80.0–100.0)
Platelets: 290 10*3/uL (ref 150–400)
RBC: 4.85 MIL/uL (ref 4.22–5.81)
RDW: 18.6 % — ABNORMAL HIGH (ref 11.5–15.5)
WBC: 6.9 10*3/uL (ref 4.0–10.5)
nRBC: 0 % (ref 0.0–0.2)

## 2020-06-13 LAB — CBG MONITORING, ED: Glucose-Capillary: 364 mg/dL — ABNORMAL HIGH (ref 70–99)

## 2020-06-13 LAB — COMPREHENSIVE METABOLIC PANEL
ALT: 29 U/L (ref 0–44)
AST: 57 U/L — ABNORMAL HIGH (ref 15–41)
Albumin: 3.6 g/dL (ref 3.5–5.0)
Alkaline Phosphatase: 222 U/L — ABNORMAL HIGH (ref 38–126)
Anion gap: 13 (ref 5–15)
BUN: 31 mg/dL — ABNORMAL HIGH (ref 6–20)
CO2: 26 mmol/L (ref 22–32)
Calcium: 9.2 mg/dL (ref 8.9–10.3)
Chloride: 96 mmol/L — ABNORMAL LOW (ref 98–111)
Creatinine, Ser: 3.84 mg/dL — ABNORMAL HIGH (ref 0.61–1.24)
GFR, Estimated: 17 mL/min — ABNORMAL LOW (ref >60)
Glucose, Bld: 409 mg/dL — ABNORMAL HIGH (ref 70–99)
Potassium: 4.3 mmol/L (ref 3.5–5.1)
Sodium: 135 mmol/L (ref 135–145)
Total Bilirubin: 0.5 mg/dL (ref 0.3–1.2)
Total Protein: 8.6 g/dL — ABNORMAL HIGH (ref 6.5–8.1)

## 2020-06-13 LAB — HEMOGLOBIN A1C
Hgb A1c MFr Bld: 9.2 % — ABNORMAL HIGH (ref 4.8–5.6)
Mean Plasma Glucose: 217.34 mg/dL

## 2020-06-13 LAB — MAGNESIUM: Magnesium: 1.9 mg/dL (ref 1.7–2.4)

## 2020-06-13 LAB — SARS CORONAVIRUS 2 (TAT 6-24 HRS): SARS Coronavirus 2: NEGATIVE

## 2020-06-13 LAB — PHOSPHORUS: Phosphorus: 5.5 mg/dL — ABNORMAL HIGH (ref 2.5–4.6)

## 2020-06-13 LAB — APTT: aPTT: 31 seconds (ref 24–36)

## 2020-06-13 LAB — PROTIME-INR
INR: 1.1 (ref 0.8–1.2)
Prothrombin Time: 13.5 seconds (ref 11.4–15.2)

## 2020-06-13 MED ORDER — HEPARIN SODIUM (PORCINE) 5000 UNIT/ML IJ SOLN
5000.0000 [IU] | Freq: Three times a day (TID) | INTRAMUSCULAR | Status: DC
  Administered 2020-06-13: 5000 [IU] via SUBCUTANEOUS
  Filled 2020-06-13: qty 1

## 2020-06-13 MED ORDER — PANTOPRAZOLE SODIUM 40 MG PO TBEC: 40.0000 mg | DELAYED_RELEASE_TABLET | Freq: Every day | ORAL | Status: DC

## 2020-06-13 MED ORDER — HYDROMORPHONE HCL 1 MG/ML IJ SOLN
0.5000 mg | INTRAMUSCULAR | Status: DC | PRN
  Filled 2020-06-13: qty 1

## 2020-06-13 MED ORDER — CHLORHEXIDINE GLUCONATE CLOTH 2 % EX PADS: 6.0000 | MEDICATED_PAD | Freq: Every day | CUTANEOUS | Status: DC

## 2020-06-13 MED ORDER — INSULIN ASPART 100 UNIT/ML ~~LOC~~ SOLN
0.0000 [IU] | SUBCUTANEOUS | Status: DC
  Administered 2020-06-13: 15 [IU] via SUBCUTANEOUS
  Filled 2020-06-13: qty 1

## 2020-06-13 MED ORDER — PROCHLORPERAZINE EDISYLATE 10 MG/2ML IJ SOLN
10.0000 mg | Freq: Four times a day (QID) | INTRAMUSCULAR | Status: DC | PRN
  Administered 2020-06-13: 10 mg via INTRAVENOUS
  Filled 2020-06-13: qty 2

## 2020-06-13 MED ORDER — PANTOPRAZOLE SODIUM 40 MG IV SOLR
40.0000 mg | INTRAVENOUS | Status: DC
  Filled 2020-06-13: qty 40

## 2020-06-13 MED ORDER — INSULIN ASPART 100 UNIT/ML ~~LOC~~ SOLN: 0.0000 [IU] | Freq: Three times a day (TID) | SUBCUTANEOUS | Status: DC

## 2020-06-13 MED ORDER — SODIUM CHLORIDE 0.9 % IV SOLN: INTRAVENOUS | Status: DC

## 2020-06-13 MED ORDER — INSULIN ASPART 100 UNIT/ML ~~LOC~~ SOLN: 0.0000 [IU] | Freq: Every day | SUBCUTANEOUS | Status: DC

## 2020-06-13 NOTE — H&P (Addendum)
History and Physical  Zachary Young [id number] DOB: [birth year] DOA: 06/12/2020  Referring physician: Hayden Rasmussen, MD PCP: Pcp, No  Patient coming from: Home  Chief Complaint: Vomiting and Diarrhea  HPI: Zachary Young is a 60 y.o. male with medical history significant for ESRD on HD (TTS), T2DM,  chronic combined CHF, HTN, diabetic neuropathy  who presents to the ED due to 1 week onset of abdominal pain, vomiting and 4-day onset of diarrhea.  Abdominal pain was diffuse (though, worse in epigastric area), it was sharp and crampy in nature and it radiates to the back, pain was rated as 10/10 on pain scale. Vomiting was several episodes daily, patient endorsed blood in vomitus. Diarrhea started about 4 days ago and was several episodes daily.  He states that he has been taking some Pepto-Bismol without any relief, patient denies fever, chills, chest pain or shortness of breath.  ED Course:  In the emergency department, BP was 185/109, but other vital signs were within normal range.  Work-up in the ED showed hyponatremia, baseline creatinine 29/4.0, GFR 17, lipase 780, albumin 3.0 CT abdomen and pelvis without contrast showed mild swelling of the body of the pancreas with mild surrounding edema consistent with early/mild pancreatitis.  IV Dilaudid and Zofran were given.  Patient was provided with IV hydration, Protonix was given.  Hospitalist was asked to admit patient for further evaluation and management.  Review of Systems: Constitutional: Negative for chills and fever.  HENT: Negative for ear pain and sore throat.   Eyes: Negative for pain and visual disturbance.  Respiratory: Negative for cough, chest tightness and shortness of breath.   Cardiovascular: Negative for chest pain and palpitations.  Gastrointestinal: Positive for abdominal pain, diarrhea and vomiting.  Endocrine: Negative for polyphagia and polyuria.  Genitourinary: Negative for decreased urine volume, dysuria,  enuresis Musculoskeletal: Negative for arthralgias and back pain.  Skin: Negative for color change and rash.  Allergic/Immunologic: Negative for immunocompromised state.  Neurological: Negative for tremors, syncope, speech difficulty, weakness, light-headedness and headaches.  Hematological: Does not bruise/bleed easily.  All other systems reviewed and are negative    Past Medical History:  Diagnosis Date  . CHF (congestive heart failure) ([place])   . Diabetes mellitus without complication ([place])   . Dialysis patient ([place])   . Peripheral edema   . Renal disorder    kidney disease   Past Surgical History:  Procedure Laterality Date  . AMPUTATION Right 10/21/2019   Procedure: RIGHT BELOW KNEE AMPUTATION;  Surgeon: Newt Minion, MD;  Location: [facility];  Service: Orthopedics;  Laterality: Right;  . AV FISTULA PLACEMENT Left 11/13/2019   Procedure: LEFT ARTERIOVENOUS (AV) FISTULA VERSUS ARTERIOVENOUS GRAFT;  Surgeon: Rosetta Posner, MD;  Location: [facility];  Service: Vascular;  Laterality: Left;  . AV FISTULA PLACEMENT Left 04/05/2020   Procedure: INSERTION OF LEFT ARM ARTERIOVENOUS (AV) GORE-TEX GRAFT;  Surgeon: Rosetta Posner, MD;  Location: [facility];  Service: Vascular;  Laterality: Left;  . BASCILIC VEIN TRANSPOSITION Left 01/27/2020   Procedure: LEFT ARM 2ND STAGE BASCILIC VEIN TRANSPOSITION;  Surgeon: Rosetta Posner, MD;  Location: [facility];  Service: Vascular;  Laterality: Left;  . DIALYSIS/PERMA CATHETER INSERTION N/A 03/03/2020   Procedure: DIALYSIS/PERMA CATHETER INSERTION;  Surgeon: Algernon Huxley, MD;  Location: [facility];  Service: Cardiovascular;  Laterality: N/A;  . INSERTION OF DIALYSIS CATHETER N/A 04/05/2020   Procedure: INSERTION OF TUNNEL  DIALYSIS CATHETER LEFT INTERNAL JUGULAR;  Surgeon: Rosetta Posner, MD;  Location: [facility];  Service: Vascular;  Laterality: N/A;  . IR FLUORO GUIDE CV LINE RIGHT  11/05/2019  . IR FLUORO GUIDE CV LINE RIGHT  06/07/2020  . IR REMOVAL TUN CV CATH  W/O FL  06/01/2020  . IR THROMBECTOMY AV FISTULA W/THROMBOLYSIS/PTA INC/SHUNT/IMG LEFT Left 06/06/2020  . IR US GUIDE VASC ACCESS LEFT  06/06/2020  . IR US GUIDE VASC ACCESS RIGHT  11/05/2019  . IR US GUIDE VASC ACCESS RIGHT  06/07/2020  . REMOVAL OF A DIALYSIS CATHETER Right 04/05/2020   Procedure: REMOVAL OF RIGHT CHEST DIALYSIS CATHETER;  Surgeon: Rosetta Posner, MD;  Location: [facility];  Service: Vascular;  Laterality: Right;  . TOE AMPUTATION Bilateral    due to osteomyelitis, all 5 each foot    Social History:  reports that he has been smoking cigarettes. He has been smoking about 0.50 packs per day. He has never used smokeless tobacco. He reports previous alcohol use. He reports previous drug use. Drug: Cocaine.   Allergies  Allergen Reactions  . Bee Venom Anaphylaxis    Per pt, nearly died from bee sting as a child  . Propofol Other (See Comments)     Hallucinations  . Morphine Nausea And Vomiting  . Oxycodone Nausea And Vomiting    Family History  Problem Relation Age of Onset  . Cancer Mother      Prior to Admission medications   Medication Sig Start Date End Date Taking? Authorizing Provider  albuterol (VENTOLIN HFA) 108 (90 Base) MCG/ACT inhaler Inhale 1 puff into the lungs every 4 (four) hours as needed for wheezing or shortness of breath. 08/20/19  Yes Gladys Damme, MD  aspirin EC 81 MG EC tablet Take 1 tablet (81 mg total) by mouth daily. 08/21/19  Yes Gladys Damme, MD  ergocalciferol (VITAMIN D2) 1.25 MG (50000 UT) capsule Take 1 capsule (50,000 Units total) by mouth every Friday. 12/11/19  Yes Samella Parr, NP  furosemide (LASIX) 40 MG tablet Take 40 mg by mouth 2 (two) times daily. 05/21/20  Yes [provider]  insulin glargine (LANTUS) 100 UNIT/ML injection Inject 0.35 mLs (35 Units total) into the skin at bedtime. Patient taking differently: Inject 25 Units into the skin at bedtime. 12/11/19  Yes Samella Parr, NP  Insulin Pen Needle (PEN NEEDLES  31GX5/16") 31G X 8 MM MISC 1 each by Other route daily. 12/11/19 12/10/20 Yes Samella Parr, NP  midodrine (PROAMATINE) 10 MG tablet Take 1 tablet (10 mg total) by mouth 3 (three) times daily with meals. 12/11/19  Yes Samella Parr, NP  NOVOLOG FLEXPEN 100 UNIT/ML FlexPen Inject 10 Units into the skin 3 (three) times daily. 05/24/20  Yes [provider]  phenytoin (DILANTIN) 100 MG ER capsule Take 3 capsules (300 mg total) by mouth at bedtime. 06/08/20  Yes Garvin Fila, MD  pregabalin (LYRICA) 50 MG capsule Take 1 capsule (50 mg total) by mouth 2 (two) times daily. 12/11/19  Yes Samella Parr, NP  torsemide (DEMADEX) 100 MG tablet Take 100 mg by mouth daily. On Monday-Wednesday-Friday-Sunday (non-dialysis days) 04/29/20  Yes [provider]    Physical Exam: BP (!) 161/90   Pulse 83   Temp 98.4 F (36.9 C) (Oral)   Resp 16   Ht '6\' 3"'$  (1.905 m)   Wt 134.3 kg   SpO2 96%   BMI 37.00 kg/m   . General: 60 y.o. year-old male well developed well nourished in no acute distress.  Alert  and oriented x3. Marland Kitchen HEENT: NCAT, EOMI . Neck: Supple, trachea medial . Cardiovascular: Regular rate and rhythm with no rubs or gallops.  No thyromegaly or JVD noted.  No lower extremity edema. 2/4 pulses in all 4 extremities. Marland Kitchen Respiratory: Clear to auscultation with no wheezes or rales. Good inspiratory effort. . Abdomen: Soft, tender to palpation, Normal bowel sounds x4 quadrants. . Muskuloskeletal: Right BKA, Left transmetatarsal amputation. . Neuro: CN II-XII intact, strength 5/5 x 4, sensation, reflexes intact . Skin: No ulcerative lesions noted or rashes . Psychiatry: Mood is appropriate for condition and setting          Labs on Admission:  Basic Metabolic Panel: Recent Labs  Lab 06/12/20 2015  NA 127*  K 4.1  CL 96*  CO2 26  GLUCOSE 419*  BUN 29*  CREATININE 4.00*  CALCIUM 8.4*  MG 1.8   Liver Function Tests: Recent Labs  Lab 06/12/20 2015  AST 12*  ALT 8   ALKPHOS 99  BILITOT 0.4  PROT 7.9  ALBUMIN 3.0*   Recent Labs  Lab 06/12/20 1952  LIPASE 780*   No results for input(s): AMMONIA in the last 168 hours. CBC: Recent Labs  Lab 06/12/20 2015  WBC 7.9  NEUTROABS 5.5  HGB 12.3*  HCT 39.9  MCV 84.9  PLT 320   Cardiac Enzymes: No results for input(s): CKTOTAL, CKMB, CKMBINDEX, TROPONINI in the last 168 hours.  BNP (last 3 results) Recent Labs    08/15/19 0944 08/30/19 2152  BNP 148.2* 48.1    ProBNP (last 3 results) No results for input(s): PROBNP in the last 8760 hours.  CBG: Recent Labs  Lab 06/06/20 1228 06/06/20 1721 06/07/20 1254  GLUCAP 350* 208* 338*    Radiological Exams on Admission: CT ABDOMEN PELVIS WO CONTRAST  Result Date: 06/12/2020 CLINICAL DATA:  Acute nonlocalized abdominal pain. Vomiting. Renal failure. Dialysis patient. EXAM: CT ABDOMEN AND PELVIS WITHOUT CONTRAST TECHNIQUE: Multidetector CT imaging of the abdomen and pelvis was performed following the standard protocol without IV contrast. COMPARISON:  12/14/2018 FINDINGS: Lower chest: Interstitial and early alveolar pattern in the lower lungs consistent with heart failure or fluid overload. No consolidation or collapse. No pleural effusion. Hepatobiliary: Liver parenchyma appears normal without contrast. Previous cholecystectomy. Pancreas: There appears to be mild swelling with surrounding edema in the region of the body of the pancreas suggesting early or mild pancreatitis. Spleen: Normal Adrenals/Urinary Tract: Adrenal glands are normal. Kidneys are normal. Bladder is normal. Stomach/Bowel: Stomach and small intestine are normal. Normal appendix. No colon abnormality seen. Vascular/Lymphatic: Aortic atherosclerosis. No aneurysm. IVC is normal. No adenopathy. Reproductive: Normal Other: No free fluid or air. Musculoskeletal: Right inguinal hernia containing fat. Mild lower lumbar degenerative changes. IMPRESSION: Lung bases show interstitial and early  alveolar edema consistent with fluid overload. Mild swelling of the body of the pancreas with mild surrounding edema consistent with early/mild pancreatitis. Electronically Signed   By: Nelson Chimes M.D.   On: 06/12/2020 21:56    EKG: I independently viewed the EKG done and my findings are as followed: Sinus rhythm at a rate of 87 bpm with prolonged QTc (503 ms)  Assessment/Plan Present on Admission: . Acute pancreatitis . GI bleed . Gastritis . Abdominal pain . Hypoalbuminemia . Hypertensive heart disease with CHF (congestive heart failure) (HCC)  Principal Problem:   Acute pancreatitis Active Problems:   GI bleed   Hypertensive heart disease with CHF (congestive heart failure) (HCC)   Gastritis   Abdominal pain  Hypoalbuminemia   CKD (chronic kidney disease) stage V requiring chronic dialysis (HCC)   Vomiting   Diarrhea   Hyponatremia   Elevated lipase   Hyperglycemia due to diabetes mellitus (HCC)   Prolonged QT interval  Abdominal pain and vomiting possibly secondary to acute pancreatitis Elevated lipase level Lipase was 780; patient complains of abdominal pain with radiation to the back BISAP Score = 1 point Continue IV Zofran p.r.n Continue IV Dilaudid p.r.n for pain Continue Protonix IV hydration was provided in the ED Continue n.p.o. at this time with plan to advance diet as tolerated RUQ U/S in the morning to investigate biliary etiology (gallstone and bile duct dilatation  Gastritis Continue IV Protonix  Reported GI bleed Patient complained of bloody vomiting, he has not had any bloody vomiting since arrival to the ED Hemoglobin at 12.3, this was 11.9 on 2/18 FOBT was negative Continue IV Protonix Gastroenterologist will be consulted in the morning  Diarrhea Patient was afebrile, he denies use of any laxative prior to onset of diarrhea C. difficile and GI panel will be checked IV hydration provided in the ED  CKD stage V requiring chronic  dialysis-currently in stage IV  Patient still produces urine Patient has dialysis on TTS Nephrology will be consulted for maintenance dialysis while inpatient  Hyponatremia Na 127; this was possibly due to decreased oral intake due to vomiting and fluid loss due to diarrhea IV hydration was provided in the ED  Prolonged QTc (503 ms) Avoid QT prolonging drugs Magnesium level will be checked  Hyperglycemia secondary to poorly controlled T2DM CBG 419; continue ISS and hypoglycemic protocol  Hypoalbuminemia possible secondary to moderate protein calorie malnutrition Protein supplement will be provided once patient is able to tolerate oral intake  Essential hypertension Resume home meds when patient is able to tolerate oral intake  Chronic diastolic CHF Continue total input/output, daily weights and fluid restriction Continue Cardiac diet  EKG personally reviewed showed normal sinus rhythm at a rate of 87 bpm with prolonged QTc (503 ms)   Echocardiogram done on 08/16/2019 showed LVEF of 50 to 55%  Volume status will be addressed with hemodialysis  Obesity (BMI 37.0) Patient was counseled on diet and lifestyle location  Tobacco use disorder Patient was counseled on tobacco abuse cessation  s/p right BKA/left transmetatarsal amputation Continue fall precaution and neurochecks Continue PT/OT eval and treat   DVT prophylaxis: Heparin subcu  Code Status: Full code  Family Communication: None at bedside  Disposition Plan:  Patient is from:                        home Anticipated DC to:                   SNF or family members home Anticipated DC date:               2-3 days Anticipated DC barriers:           Patient requires inpatient management due to abdominal pain and vomiting secondary to acute pancreatitis  Consults called: Nephrology, gastroenterology  Admission status: Inpatient  Bernadette Hoit MD [facility]  06/13/2020, 12:57 AM

## 2020-06-13 NOTE — Consult Note (Signed)
Referring Provider: Dr. Josephine Cables Primary Care Physician:  Pcp, No Primary Gastroenterologist:  None  Date of Consultation: 06/13/20 Patient left AMA prior to admission  Reason for Consultation:  Hematemesis   HPI:  Zachary Young is a 60 y.o. year old male with history of ESRD on dialysis, diabetes, CHF, HTN, diabetic neuropathy, presenting to the ED yesterday with abdominal pain, N/V/D. Concern for hematemesis per patient. Heme negative. COVID test in process.    CT abd/pelvis without contrast shows mild swelling of pancreas body with mild surrounding edema consistent with early/mild pancreatitis. Alk Phos 222, AST 57, newly elevated. Lipase 780 on admission.   States 7 days ago started seeing dark red blood in emesis, every time he threw up. Every time he ate, would vomit. Last episode of hematemesis was last night. None since presentation here. Was taking pepto and saw black stool. Loose stools like water prior to admission.  Heme negative here. Epigastric pain started 7 days ago but worsened in intensity and couldn't take it any longer. Hasn't had food in 2 days. Was eating soups for the past week.   No abdominal pain, N/V today. No ETOH. Remote history of ETOH use. Has chronic GERD. Eats mustard and pepto bismol. No PPI. No dysphagia. No long-standing history of appetite or weight loss prior to this. No NSAIDs.   +Cocaine use, last used a few months ago.   EGD [facility] in [facility] for "bleeding ulcers", about 20 years ago. Found ulcers per patient. No prior colonoscopy.    Per review of chart, he was also inpatient Aug 2020 at [facility] for hematemesis and LEFT AMA. EGD had been scheduled but never performed as patient wanted to eat and left.   Past Medical History:  Diagnosis Date  . CHF (congestive heart failure) ([place])   . Diabetes mellitus without complication ([place])   . Dialysis patient ([place])   . Peripheral edema   . Renal disorder    kidney disease    Past  Surgical History:  Procedure Laterality Date  . AMPUTATION Right 10/21/2019   Procedure: RIGHT BELOW KNEE AMPUTATION;  Surgeon: Newt Minion, MD;  Location: [facility];  Service: Orthopedics;  Laterality: Right;  . AV FISTULA PLACEMENT Left 11/13/2019   Procedure: LEFT ARTERIOVENOUS (AV) FISTULA VERSUS ARTERIOVENOUS GRAFT;  Surgeon: Rosetta Posner, MD;  Location: [facility] OR;  Service: Vascular;  Laterality: Left;  . AV FISTULA PLACEMENT Left 04/05/2020   Procedure: INSERTION OF LEFT ARM ARTERIOVENOUS (AV) GORE-TEX GRAFT;  Surgeon: Rosetta Posner, MD;  Location: [place];  Service: Vascular;  Laterality: Left;  . BASCILIC VEIN TRANSPOSITION Left 01/27/2020   Procedure: LEFT ARM 2ND STAGE BASCILIC VEIN TRANSPOSITION;  Surgeon: Rosetta Posner, MD;  Location: [facility];  Service: Vascular;  Laterality: Left;  . DIALYSIS/PERMA CATHETER INSERTION N/A 03/03/2020   Procedure: DIALYSIS/PERMA CATHETER INSERTION;  Surgeon: Algernon Huxley, MD;  Location: [place];  Service: Cardiovascular;  Laterality: N/A;  . INSERTION OF DIALYSIS CATHETER N/A 04/05/2020   Procedure: INSERTION OF TUNNEL  DIALYSIS CATHETER LEFT INTERNAL JUGULAR;  Surgeon: Rosetta Posner, MD;  Location: [place];  Service: Vascular;  Laterality: N/A;  . IR FLUORO GUIDE CV LINE RIGHT  11/05/2019  . IR FLUORO GUIDE CV LINE RIGHT  06/07/2020  . IR REMOVAL TUN CV CATH W/O FL  06/01/2020  . IR THROMBECTOMY AV FISTULA W/THROMBOLYSIS/PTA INC/SHUNT/IMG LEFT Left 06/06/2020  . IR US GUIDE VASC ACCESS LEFT  06/06/2020  . IR US GUIDE  VASC ACCESS RIGHT  11/05/2019  . IR US GUIDE VASC ACCESS RIGHT  06/07/2020  . REMOVAL OF A DIALYSIS CATHETER Right 04/05/2020   Procedure: REMOVAL OF RIGHT CHEST DIALYSIS CATHETER;  Surgeon: Rosetta Posner, MD;  Location: [place];  Service: Vascular;  Laterality: Right;  . TOE AMPUTATION Bilateral    due to osteomyelitis, all 5 each foot    Prior to Admission medications   Medication Sig Start Date End Date Taking? Authorizing Provider   albuterol (VENTOLIN HFA) 108 (90 Base) MCG/ACT inhaler Inhale 1 puff into the lungs every 4 (four) hours as needed for wheezing or shortness of breath. 08/20/19  Yes Gladys Damme, MD  aspirin EC 81 MG EC tablet Take 1 tablet (81 mg total) by mouth daily. 08/21/19  Yes Gladys Damme, MD  ergocalciferol (VITAMIN D2) 1.25 MG (50000 UT) capsule Take 1 capsule (50,000 Units total) by mouth every Friday. 12/11/19  Yes Samella Parr, NP  furosemide (LASIX) 40 MG tablet Take 40 mg by mouth 2 (two) times daily. 05/21/20  Yes [provider]  insulin glargine (LANTUS) 100 UNIT/ML injection Inject 0.35 mLs (35 Units total) into the skin at bedtime. Patient taking differently: Inject 25 Units into the skin at bedtime. 12/11/19  Yes Samella Parr, NP  Insulin Pen Needle (PEN NEEDLES 31GX5/16") 31G X 8 MM MISC 1 each by Other route daily. 12/11/19 12/10/20 Yes Samella Parr, NP  midodrine (PROAMATINE) 10 MG tablet Take 1 tablet (10 mg total) by mouth 3 (three) times daily with meals. 12/11/19  Yes Samella Parr, NP  NOVOLOG FLEXPEN 100 UNIT/ML FlexPen Inject 10 Units into the skin 3 (three) times daily. 05/24/20  Yes [provider]  phenytoin (DILANTIN) 100 MG ER capsule Take 3 capsules (300 mg total) by mouth at bedtime. 06/08/20  Yes Garvin Fila, MD  pregabalin (LYRICA) 50 MG capsule Take 1 capsule (50 mg total) by mouth 2 (two) times daily. 12/11/19  Yes Samella Parr, NP  torsemide (DEMADEX) 100 MG tablet Take 100 mg by mouth daily. On Monday-Wednesday-Friday-Sunday (non-dialysis days) 04/29/20  Yes [provider]    Current Facility-Administered Medications  Medication Dose Route Frequency Provider Last Rate Last Admin  . dextrose 5 %-0.9 % sodium chloride infusion   Intravenous Continuous Adefeso, Oladapo, DO 100 mL/hr at 06/13/20 0627 Rate Verify at 06/13/20 0627  . heparin injection 5,000 Units  5,000 Units Subcutaneous Q8H Adefeso, Oladapo, DO   5,000 Units at  06/13/20 0241  . HYDROmorphone (DILAUDID) injection 0.5 mg  0.5 mg Intravenous Q4H PRN Adefeso, Oladapo, DO      . insulin aspart (novoLOG) injection 0-15 Units  0-15 Units Subcutaneous TID WC Adefeso, Oladapo, DO      . insulin aspart (novoLOG) injection 0-5 Units  0-5 Units Subcutaneous QHS Adefeso, Oladapo, DO      . pantoprazole (PROTONIX) injection 40 mg  40 mg Intravenous Q24H Adefeso, Oladapo, DO      . prochlorperazine (COMPAZINE) injection 10 mg  10 mg Intravenous Q6H PRN Adefeso, Oladapo, DO   10 mg at 06/13/20 8250   Current Outpatient Medications  Medication Sig Dispense Refill  . albuterol (VENTOLIN HFA) 108 (90 Base) MCG/ACT inhaler Inhale 1 puff into the lungs every 4 (four) hours as needed for wheezing or shortness of breath. 6.7 g 0  . aspirin EC 81 MG EC tablet Take 1 tablet (81 mg total) by mouth daily. 30 tablet 0  . ergocalciferol (VITAMIN D2) 1.25  MG (50000 UT) capsule Take 1 capsule (50,000 Units total) by mouth every Friday. 30 capsule 1  . furosemide (LASIX) 40 MG tablet Take 40 mg by mouth 2 (two) times daily.    . insulin glargine (LANTUS) 100 UNIT/ML injection Inject 0.35 mLs (35 Units total) into the skin at bedtime. (Patient taking differently: Inject 25 Units into the skin at bedtime.) 10 mL 11  . Insulin Pen Needle (PEN NEEDLES 31GX5/16") 31G X 8 MM MISC 1 each by Other route daily. 90 each 2  . midodrine (PROAMATINE) 10 MG tablet Take 1 tablet (10 mg total) by mouth 3 (three) times daily with meals. 180 tablet 2  . NOVOLOG FLEXPEN 100 UNIT/ML FlexPen Inject 10 Units into the skin 3 (three) times daily.    . phenytoin (DILANTIN) 100 MG ER capsule Take 3 capsules (300 mg total) by mouth at bedtime. 30 capsule 0  . pregabalin (LYRICA) 50 MG capsule Take 1 capsule (50 mg total) by mouth 2 (two) times daily. 60 capsule 2  . torsemide (DEMADEX) 100 MG tablet Take 100 mg by mouth daily. On Monday-Wednesday-Friday-Sunday (non-dialysis days)      Allergies as of  06/12/2020 - Review Complete 06/12/2020  Allergen Reaction Noted  . Bee venom Anaphylaxis 06/12/2016  . Propofol Other (See Comments) 04/28/2018  . Morphine Nausea And Vomiting 03/05/2018  . Oxycodone Nausea And Vomiting 06/18/2019    Family History  Problem Relation Age of Onset  . Cancer Mother     Social History   Socioeconomic History  . Marital status: Single    Spouse name: Not on file  . Number of children: Not on file  . Years of education: Not on file  . Highest education level: Not on file  Occupational History  . Not on file  Tobacco Use  . Smoking status: Current Every Day Smoker    Packs/day: 0.50    Types: Cigarettes  . Smokeless tobacco: Never Used  . Tobacco comment: smoked this am  Vaping Use  . Vaping Use: Never used  Substance and Sexual Activity  . Alcohol use: Not Currently  . Drug use: Not Currently    Types: Cocaine    Comment: does not use  . Sexual activity: Not on file  Other Topics Concern  . Not on file  Social History Narrative   Lives with friend   Right Handed   Drinks no caffeine   Social Determinants of Health   Financial Resource Strain: Not on file  Food Insecurity: Not on file  Transportation Needs: Not on file  Physical Activity: Not on file  Stress: Not on file  Social Connections: Not on file  Intimate Partner Violence: Not on file    Review of Systems: Gen: Denies fever, chills, loss of appetite, change in weight or weight loss CV: Denies chest pain, heart palpitations, syncope, edema  Resp: Denies shortness of breath with rest, cough, wheezing GI: see HPI GU : Denies urinary burning, urinary frequency, urinary incontinence.  MS: Denies joint pain,swelling, cramping Derm: Denies rash, itching, dry skin Psych: Denies depression, anxiety,confusion, or memory loss Heme: see HPI  Physical Exam: Vital signs in last 24 hours: Temp:  [98 F (36.7 C)-98.4 F (36.9 C)] 98 F (36.7 C) (02/28 0625) Pulse Rate:   [83-90] 85 (02/28 0625) Resp:  [11-27] 16 (02/28 0625) BP: (132-185)/(66-109) 136/97 (02/28 0625) SpO2:  [90 %-98 %] 95 % (02/28 0625) Weight:  [134.3 kg] 134.3 kg (02/27 1943)   General:  Alert,  Well-developed, well-nourished, irritable Head:  Normocephalic and atraumatic. Eyes:  Sclera clear, no icterus.   Conjunctiva pink. Ears:  Normal auditory acuity. Nose:  No deformity, discharge,  or lesions. Mouth:  Poor dentition  Lungs:  Clear throughout to auscultation.    Heart:  Regular rate and rhythm; no murmurs, clicks, rubs,  or gallops. Abdomen:  Soft, nontender and nondistended. No masses, hepatosplenomegaly or hernias noted. Normal bowel sounds, without guarding, and without rebound.   Rectal:  Deferred  Msk:  Symmetrical without gross deformities. Normal posture. Extremities:  With right BKA, left foot with all 5 toes removed  Neurologic:  Alert and  oriented x4 Psych:  Flat affect  Intake/Output from previous day: No intake/output data recorded. Intake/Output this shift: No intake/output data recorded.  Lab Results: Recent Labs    06/12/20 2015 06/13/20 0640  WBC 7.9 6.9  HGB 12.3* 12.5*  HCT 39.9 42.1  PLT 320 290   BMET Recent Labs    06/12/20 2015 06/13/20 0640  NA 127* 135  K 4.1 4.3  CL 96* 96*  CO2 26 26  GLUCOSE 419* 409*  BUN 29* 31*  CREATININE 4.00* 3.84*  CALCIUM 8.4* 9.2   LFT Recent Labs    06/12/20 2015 06/13/20 0640  PROT 7.9 8.6*  ALBUMIN 3.0* 3.6  AST 12* 57*  ALT 8 29  ALKPHOS 99 222*  BILITOT 0.4 0.5   PT/INR Recent Labs    06/12/20 2015 06/13/20 0640  LABPROT 12.7 13.5  INR 1.0 1.1   Lab Results  Component Value Date   LIPASE 780 (H) 06/12/2020    Studies/Results: CT ABDOMEN PELVIS WO CONTRAST  Result Date: 06/12/2020 CLINICAL DATA:  Acute nonlocalized abdominal pain. Vomiting. Renal failure. Dialysis patient. EXAM: CT ABDOMEN AND PELVIS WITHOUT CONTRAST TECHNIQUE: Multidetector CT imaging of the abdomen and  pelvis was performed following the standard protocol without IV contrast. COMPARISON:  12/14/2018 FINDINGS: Lower chest: Interstitial and early alveolar pattern in the lower lungs consistent with heart failure or fluid overload. No consolidation or collapse. No pleural effusion. Hepatobiliary: Liver parenchyma appears normal without contrast. Previous cholecystectomy. Pancreas: There appears to be mild swelling with surrounding edema in the region of the body of the pancreas suggesting early or mild pancreatitis. Spleen: Normal Adrenals/Urinary Tract: Adrenal glands are normal. Kidneys are normal. Bladder is normal. Stomach/Bowel: Stomach and small intestine are normal. Normal appendix. No colon abnormality seen. Vascular/Lymphatic: Aortic atherosclerosis. No aneurysm. IVC is normal. No adenopathy. Reproductive: Normal Other: No free fluid or air. Musculoskeletal: Right inguinal hernia containing fat. Mild lower lumbar degenerative changes. IMPRESSION: Lung bases show interstitial and early alveolar edema consistent with fluid overload. Mild swelling of the body of the pancreas with mild surrounding edema consistent with early/mild pancreatitis. Electronically Signed   By: Nelson Chimes M.D.   On: 06/12/2020 21:56    Impression/Plan: 60 y.o. year old male with history of ESRD on dialysis, diabetes, CHF, HTN, diabetic neuropathy, presenting to the ED yesterday with abdominal pain, N/V/D. Concern for hematemesis per patient. Heme negative. Found to have elevated lipase and CT without contrast revealed mild pancreatitis.   Upon consultation, patient noted improvement in abdominal pain and desired to eat. He did not want any endoscopic evaluation for hematemesis done while inpatient but agreed to outpatient procedures.   Review of chart reveals he was inpatient Aug 2020 at [facility] with hematemesis and plans for EGD but left AMA.   After seeing patient, he also left AMA from this  hospitalization.    Ultimately, he will need office visit to arrange procedures and follow-up on this hospitalization. However, I am concerned about compliance.   Will reach out to him and arrange outpatient follow-up visit.   Annitta Needs, PhD, ANP-BC [facility]       LOS: 1 day    06/13/2020, 8:00 AM

## 2020-06-13 NOTE — Progress Notes (Signed)
Patient arrived to the unit insisting on having a meal, explained that he was ordered npo but would contact the MD to see if he could have a diet.  MD in to see patient and explain the reason for not eating at this time and was going to let the patient have a clear liquid diet.  Patient stated he was leaving, get the IV out he was going to get something to eat.  AMA paperwork signed and IV removed.

## 2020-06-13 NOTE — TOC Progression Note (Signed)
Transition of Care [facility]) - Progression Note    Patient Details  Name: Zachary Young MRN: [id number] Date of Birth: 11/04/1960  [facility]) CM/SW Contact  Boneta Lucks, RN Phone Number: 06/13/2020, 11:47 AM  Clinical Narrative:  HD patient, has no PCP listed, TOC waiting on PT eval and GI workup, to call patient, Patient signed out AMA.

## 2020-06-13 NOTE — Progress Notes (Signed)
IV removed. Patient signed AMA form with second witness by Audrea Muscat, RN.

## 2020-06-13 NOTE — Discharge Summary (Signed)
[facility] Discharge Summary   Patient: Zachary Young ERD:[id number]   PCP: Pcp, No DOB: Aug 16, 1960   Date of admission: 06/12/2020   Date of discharge: 06/13/2020    Discharge Diagnoses:  Principal Problem:   Acute pancreatitis Active Problems:   GI bleed   Hypertensive heart disease with CHF (congestive heart failure) (HCC)   Gastritis   HTN (hypertension)   Tobacco use disorder   Abdominal pain   Hypoalbuminemia   CKD (chronic kidney disease) stage V requiring chronic dialysis (HCC)   Vomiting   Diarrhea   Hyponatremia   Elevated lipase   Hyperglycemia due to diabetes mellitus (HCC)   Prolonged QT interval  Discharge Condition: patient left AMA  History of present illness: As per the H and P dictated on admission, "Zachary Young is a 60 y.o. male with medical history significant for ESRD on HD (TTS), T2DM, chronic combined CHF, HTN, diabetic neuropathy  who presents to the ED due to 1 week onset of abdominal pain, vomiting and 4-day onset of diarrhea.  Abdominal pain was diffuse (though, worse in epigastric area), it was sharp and crampy in nature and it radiates to the back, pain was rated as 10/10 on pain scale. Vomiting was several episodes daily, patient endorsed blood in vomitus. Diarrhea started about 4 days ago and was several episodes daily.  He states that he has been taking some Pepto-Bismol without any relief, patient denies fever, chills, chest pain or shortness of breath.  ED Course:  In the emergency department, BP was 185/109, but other vital signs were within normal range.  Work-up in the ED showed hyponatremia, baseline creatinine 29/4.0, GFR 17, lipase 780, albumin 3.0 CT abdomen and pelvis without contrast showed mild swelling of the body of the pancreas with mild surrounding edema consistent with early/mild pancreatitis.  IV Dilaudid and Zofran were given.  Patient was provided with IV hydration, Protonix was given.  Hospitalist was asked to admit  patient for further evaluation and management."  Hospital Course:  Patient presented with abdominal pain along with nausea and vomiting found to have acute pancreatitis and gastritis.  GI was consulted.  Patient was kept n.p.o. secondary to an pancreatitis and ongoing abdominal pain symptoms.  Patient reminded to advance his diet further.  Explained to the patient the rationale for diet for the treatment of pancreatitis and hematemesis. Pt wanted to eat grilled cheese sandwich and explained association of fatty food and acute  pancreatis. He got upset and despite explaining the risk of readmission and death pt wanted to leave AMA.   Summary of his active problems in the hospital is as following. Principal Problem:   Acute pancreatitis Active Problems:   GI bleed   Hypertensive heart disease with CHF (congestive heart failure) (HCC)   Gastritis   HTN (hypertension)   Tobacco use disorder   Abdominal pain   Hypoalbuminemia   CKD (chronic kidney disease) stage V requiring chronic dialysis (HCC)   Vomiting   Diarrhea   Hyponatremia   Elevated lipase   Hyperglycemia due to diabetes mellitus (HCC)   Prolonged QT interval  patient left AMA  Procedures and Results:  none   Consultations:  Gastroenterology   The results of significant diagnostics from this hospitalization (including imaging, microbiology, ancillary and laboratory) are listed below for reference.    Significant Diagnostic Studies: CT ABDOMEN PELVIS WO CONTRAST  Result Date: 06/12/2020 CLINICAL DATA:  Acute nonlocalized abdominal pain. Vomiting. Renal failure. Dialysis patient. EXAM: CT ABDOMEN AND PELVIS WITHOUT  CONTRAST TECHNIQUE: Multidetector CT imaging of the abdomen and pelvis was performed following the standard protocol without IV contrast. COMPARISON:  12/14/2018 FINDINGS: Lower chest: Interstitial and early alveolar pattern in the lower lungs consistent with heart failure or fluid overload. No consolidation or  collapse. No pleural effusion. Hepatobiliary: Liver parenchyma appears normal without contrast. Previous cholecystectomy. Pancreas: There appears to be mild swelling with surrounding edema in the region of the body of the pancreas suggesting early or mild pancreatitis. Spleen: Normal Adrenals/Urinary Tract: Adrenal glands are normal. Kidneys are normal. Bladder is normal. Stomach/Bowel: Stomach and small intestine are normal. Normal appendix. No colon abnormality seen. Vascular/Lymphatic: Aortic atherosclerosis. No aneurysm. IVC is normal. No adenopathy. Reproductive: Normal Other: No free fluid or air. Musculoskeletal: Right inguinal hernia containing fat. Mild lower lumbar degenerative changes. IMPRESSION: Lung bases show interstitial and early alveolar edema consistent with fluid overload. Mild swelling of the body of the pancreas with mild surrounding edema consistent with early/mild pancreatitis. Electronically Signed   By: Nelson Chimes M.D.   On: 06/12/2020 21:56   CT Head Wo Contrast  Result Date: 05/18/2020 CLINICAL DATA:  Seizure during dialysis. EXAM: CT HEAD WITHOUT CONTRAST TECHNIQUE: Contiguous axial images were obtained from the base of the skull through the vertex without intravenous contrast. COMPARISON:  None. FINDINGS: Brain: No evidence of acute infarction, hemorrhage, hydrocephalus, extra-axial collection or mass lesion/mass effect. Low-density in the deep cerebral white matter attributed to chronic small vessel ischemia given medical history. Cerebral volume loss without specific pattern. Vascular: No hyperdense vessel or unexpected calcification. Skull: Normal. Negative for fracture or focal lesion. Sinuses/Orbits: No acute finding. IMPRESSION: 1. No acute or focal finding. 2. Chronic small vessel disease. Electronically Signed   By: Monte Fantasia M.D.   On: 05/18/2020 04:55   IR Removal Tun Cv Cath W/O FL  Result Date: 06/01/2020 INDICATION: Patient with history of end-stage renal  disease on dialysis via tunneled HD catheter placed 11/06/2019 by Dr. Earleen Newport. He now has a working AV graft and is no longer in need of his tunneled dialysis catheter. Request is made for removal. EXAM: REMOVAL TUNNELED CENTRAL VENOUS CATHETER MEDICATIONS: 10 mL 1% lidocaine ANESTHESIA/SEDATION: None FLUOROSCOPY TIME:  None COMPLICATIONS: None immediate. PROCEDURE: Informed written consent was obtained from the patient after a thorough discussion of the procedural risks, benefits and alternatives. All questions were addressed. Maximal Sterile Barrier Technique was utilized including caps, mask, sterile gowns, sterile gloves, sterile drape, hand hygiene and skin antiseptic. A timeout was performed prior to the initiation of the procedure. The patient's right chest and catheter was prepped and draped in a normal sterile fashion. Heparin was removed from both ports of catheter. 1% lidocaine was used for local anesthesia. Using gentle blunt dissection the cuff of the catheter was exposed and the catheter was removed in its entirety. Pressure was held till hemostasis was obtained. A sterile dressing was applied. The patient tolerated the procedure well with no immediate complications. IMPRESSION: Successful catheter removal as described above. Read by: Brynda Greathouse PA-C Electronically Signed   By: Jacqulynn Cadet M.D.   On: 06/01/2020 15:09   IR US Guide Vasc Access Left  Result Date: 06/06/2020 INDICATION: 60 year old male with left axillary to axillary loop arteriovenous hemodialysis graft created 04/05/2020 presenting with graft thrombosis. EXAM: 1. Ultrasound-guided arteriovenous graft access x2 2. Pharmacomechanical thrombolysis of arteriovenous graft 3. Rheolytic thrombectomy of arteriovenous graft 4. Balloon sweep thrombectomy of arterial anastomosis 5. Balloon angioplasty of arteriovenous graft COMPARISON:  None. MEDICATIONS: None.  CONTRAST:  55m OMNIPAQUE IOHEXOL 300 MG/ML  SOLN ANESTHESIA/SEDATION:  Moderate (conscious) sedation was employed during this procedure. A total of Versed 1.5 mg and Fentanyl 75 mcg was administered intravenously. Moderate Sedation Time: 99 minutes. The patient's level of consciousness and vital signs were monitored continuously by radiology nursing throughout the procedure under my direct supervision. FLUOROSCOPY TIME:  19 minutes, 18 seconds, 81 mGy COMPLICATIONS: None immediate. PROCEDURE: Informed written consent was obtained from the patient after a discussion of the risk, benefits and alternatives to treatment. Questions regarding the procedure were encouraged and answered. A timeout was performed prior to the initiation of the procedure. The left arm dialysis graft was prepped with Chlorhexidine in a sterile fashion, and a sterile drape was applied covering the operative field. Ultrasound evaluation of the entire graft was performed. Antegrade access near the graft apex was performed under ultrasound guidance with a micropuncture set. Over a Wholey wire, a 6 French sheath was placed. A Glidewire and angled catheter were directed to the left subclavian vein. Pull-back venogram was performed. Pharmacomechanical thrombolysis was performed of the venous outflow portion of the graft. Retrograde access was then obtained under ultrasound guidance with a micropuncture set. Over a Wholey wire, a 6 French sheath was placed. A Glidewire and angled catheter were directed to the axillary artery. Pharmacomechanical thrombolysis was performed in the arterial limb of the graft. Using a Fogarty balloon, balloon sweep was performed of the arterial anastomosis 3 times. Rheolytic thrombectomy was then performed throughout the arterial limb of the graft. This was followed by rheolytic therapy throughout the venous aspect of the graft. Fistulagram from the axillary artery demonstrated severe focal stenosis near the arterial anastomosis. Balloon angioplasty was performed at the stenotic site with a 4  mm x 2 cm mustang balloon. There is improved patency inflow after angioplasty. Additional moderate stenosis was noted in the graft apex. Balloon angioplasty with a 7 mm x 4 cm mustang balloon was performed. There are multifocal stenoses near the axillary vein anastomosis. Balloon angioplasty was performed multiple sites. Completion fistulagram was then performed. The sheaths was removed and hemostasis obtained with application of a 2-0 Ethilon pursestring suture which will be removed at the patient's next dialysis session. Dressings were placed. The patient tolerated the procedure well without immediate postprocedural complication. FINDINGS: Complete thrombosis of the left axillary to axillary loop arteriovenous graft. Technically successful pharmacomechanical thrombolysis and rheolytic thrombectomy throughout the graft. High-grade focal stenosis at the graft arterial anastomosis which improved after angioplasty. Additional angioplasty was required at the graft apex as well as the venous anastomosis. Self-limiting axillary hematoma was observed after wire cannulation cross the venous anastomosis. There is complete cessation of extravasation after balloon tamponade. Upon completion, there was palpable thrill throughout the graft and brisk antegrade flow on completion fistulogram. IMPRESSION: 1. Thrombosed left axillary to axillary loop arteriovenous graft. 2. Technically successful declot of graft with pharmacomechanical thrombolysis, rheolytic thrombectomy, Fogarty arterial anastomosis balloon sweep, and balloon angioplasty. ACCESS: This access remains amenable to future percutaneous interventions as clinically indicated. DRuthann Cancer MD [facility] Electronically Signed   By: DRuthann CancerMD   On: 06/06/2020 17:42   IR [place] Vasc Access Right  Result Date: 06/07/2020 INDICATION: History of end-stage renal disease post attempt at left upper arm  declot (performed yesterday 06/06/2020) with early rethrombosis. Patient presents today for image guided placement of a tunneled dialysis catheter. EXAM: TUNNELED CENTRAL VENOUS HEMODIALYSIS CATHETER PLACEMENT WITH ULTRASOUND AND FLUOROSCOPIC GUIDANCE  MEDICATIONS: Ancef 2 gm IV . The antibiotic was given in an appropriate time interval prior to skin puncture. ANESTHESIA/SEDATION: Moderate (conscious) sedation was employed during this procedure. A total of Versed 0.5 mg and Fentanyl 25 mcg was administered intravenously. Moderate Sedation Time: 17 minutes. The patient's level of consciousness and vital signs were monitored continuously by radiology nursing throughout the procedure under my direct supervision. Comparisons: Image guided left upper arm dialysis graft thrombolysis-06/06/2020 FLUOROSCOPY TIME:  30 seconds (16 mGy) COMPLICATIONS: None immediate. PROCEDURE: Informed written consent was obtained from the patient after a discussion of the risks, benefits, and alternatives to treatment. Questions regarding the procedure were encouraged and answered. The right neck and chest were prepped with chlorhexidine in a sterile fashion, and a sterile drape was applied covering the operative field. Maximum barrier sterile technique with sterile gowns and gloves were used for the procedure. A timeout was performed prior to the initiation of the procedure. After creating a small venotomy incision, a micropuncture kit was utilized to access the internal jugular vein. Real-time ultrasound guidance was utilized for vascular access including the acquisition of a permanent ultrasound image documenting patency of the accessed vessel. The microwire was utilized to measure appropriate catheter length. A stiff Glidewire was advanced to the level of the IVC and the micropuncture sheath was exchanged for a peel-away sheath. A palindrome tunneled hemodialysis catheter measuring 19 cm from tip to cuff was tunneled in a retrograde  fashion from the anterior chest wall to the venotomy incision. The catheter was then placed through the peel-away sheath with tips ultimately positioned within the superior aspect of the right atrium. Final catheter positioning was confirmed and documented with a spot radiographic image. The catheter aspirates and flushes normally. The catheter was flushed with appropriate volume heparin dwells. The catheter exit site was secured with a 0-Prolene retention suture. The venotomy incision was closed with Dermabond and Steri-strips. Dressings were applied. The patient tolerated the procedure well without immediate post procedural complication. IMPRESSION: Successful placement of 19 cm tip to cuff tunneled hemodialysis catheter via the right internal jugular vein with tips terminating within the superior aspect of the right atrium. The catheter is ready for immediate use. Electronically Signed   By: Sandi Mariscal M.D.   On: 06/07/2020 14:04   US ABDOMEN LIMITED RUQ (LIVER/GB)  Result Date: 06/13/2020 CLINICAL DATA:  Abdominal pain EXAM: ULTRASOUND ABDOMEN LIMITED RIGHT UPPER QUADRANT COMPARISON:  CT AP 06/12/2020 FINDINGS: Gallbladder: Status post cholecystectomy Common bile duct: Diameter: 3.3 mm Liver: No focal lesion identified. Within normal limits in parenchymal echogenicity. Portal vein is patent on color Doppler imaging with normal direction of blood flow towards the liver. Other: None. IMPRESSION: 1. Normal exam.  No acute findings. 2. Status post cholecystectomy. Electronically Signed   By: Kerby Moors M.D.   On: 06/13/2020 09:04    Microbiology: No results found for this or any previous visit (from the past 240 hour(s)).   Labs: CBC: Recent Labs  Lab 06/12/20 2015 06/13/20 0640  WBC 7.9 6.9  NEUTROABS 5.5  --   HGB 12.3* 12.5*  HCT 39.9 42.1  MCV 84.9 86.8  PLT 320 517   Basic Metabolic Panel: Recent Labs  Lab 06/12/20 2015 06/13/20 0639 06/13/20 0640  NA 127*  --  135  K 4.1  --   4.3  CL 96*  --  96*  CO2 26  --  26  GLUCOSE 419*  --  409*  BUN 29*  --  31*  CREATININE 4.00*  --  3.84*  CALCIUM 8.4*  --  9.2  MG 1.8  --  1.9  PHOS  --  5.5*  --    Liver Function Tests: Recent Labs  Lab 06/12/20 2015 06/13/20 0640  AST 12* 57*  ALT 8 29  ALKPHOS 99 222*  BILITOT 0.4 0.5  PROT 7.9 8.6*  ALBUMIN 3.0* 3.6   Recent Labs  Lab 06/12/20 1952  LIPASE 780*   No results for input(s): AMMONIA in the last 168 hours. Cardiac Enzymes: No results for input(s): CKTOTAL, CKMB, CKMBINDEX, TROPONINI in the last 168 hours. BNP (last 3 results) Recent Labs    08/15/19 0944 08/30/19 2152  BNP 148.2* 48.1   CBG: Recent Labs  Lab 06/06/20 1721 06/07/20 1254 06/13/20 0745  GLUCAP 208* 338* 364*   Time spent: 35 minutes  Signed:  Berle Mull  [facility] 06/13/2020, 1:20 PM

## 2020-06-13 NOTE — Telephone Encounter (Signed)
Zachary Young, can you arrange outpatient hospital follow-up for pancreatitis and hematemesis? He left AMA.

## 2020-06-14 NOTE — Telephone Encounter (Signed)
Ok thanks 

## 2020-06-15 ENCOUNTER — Other Ambulatory Visit: Payer: Self-pay | Admitting: Neurology

## 2020-06-15 MED ORDER — PHENYTOIN SODIUM EXTENDED 100 MG PO CAPS: 300.0000 mg | ORAL_CAPSULE | Freq: Every day | ORAL | 3 refills | Status: AC

## 2020-06-15 NOTE — Telephone Encounter (Signed)
Okay to just do MRI of the brain and see what it shows first

## 2020-06-15 NOTE — Telephone Encounter (Signed)
The MRI Brain wo contrast was approved: [id number] (exp. 06/09/20 to 08/08/20)  The MRA Head wo contrast was not approved. I did fax the clinical notes. "on 06/09/20, we asked your provider for the following important facts or documents: doctor's notes that say you have new problems with your nerves that you did not have before. The notes could also show problems with your nerve exam finding. If you are not getting worse, we need to know how this test will change how your doctor treats you."   The phone number to do a peer to peer is [phone] and the tracking number is [id number]. They said you can do the peer to peer from 7 AM to 7 PM central time.   Do you want to do the peer to peer or do you want the patient to proceed on having the MRI Brain?

## 2020-06-16 ENCOUNTER — Inpatient Hospital Stay (HOSPITAL_COMMUNITY)
Admission: EM | Admit: 2020-06-16 | Discharge: 2020-06-17 | DRG: 377 | Discharge status: Against Medical Advice | Payer: Medicaid Other | Attending: Internal Medicine | Admitting: Internal Medicine

## 2020-06-16 ENCOUNTER — Other Ambulatory Visit: Payer: Self-pay

## 2020-06-16 ENCOUNTER — Encounter (HOSPITAL_COMMUNITY): Payer: Self-pay | Admitting: Emergency Medicine

## 2020-06-16 DIAGNOSIS — Z888 Allergy status to other drugs, medicaments and biological substances status: Secondary | ICD-10-CM

## 2020-06-16 DIAGNOSIS — K920 Hematemesis: Secondary | ICD-10-CM | POA: Diagnosis present

## 2020-06-16 DIAGNOSIS — F17210 Nicotine dependence, cigarettes, uncomplicated: Secondary | ICD-10-CM | POA: Diagnosis present

## 2020-06-16 DIAGNOSIS — I132 Hypertensive heart and chronic kidney disease with heart failure and with stage 5 chronic kidney disease, or end stage renal disease: Secondary | ICD-10-CM | POA: Diagnosis present

## 2020-06-16 DIAGNOSIS — N186 End stage renal disease: Secondary | ICD-10-CM | POA: Diagnosis present

## 2020-06-16 DIAGNOSIS — K859 Acute pancreatitis without necrosis or infection, unspecified: Secondary | ICD-10-CM | POA: Diagnosis present

## 2020-06-16 DIAGNOSIS — Z20822 Contact with and (suspected) exposure to covid-19: Secondary | ICD-10-CM | POA: Diagnosis present

## 2020-06-16 DIAGNOSIS — Z79899 Other long term (current) drug therapy: Secondary | ICD-10-CM

## 2020-06-16 DIAGNOSIS — E1159 Type 2 diabetes mellitus with other circulatory complications: Secondary | ICD-10-CM | POA: Diagnosis present

## 2020-06-16 DIAGNOSIS — K922 Gastrointestinal hemorrhage, unspecified: Secondary | ICD-10-CM | POA: Diagnosis not present

## 2020-06-16 DIAGNOSIS — E785 Hyperlipidemia, unspecified: Secondary | ICD-10-CM | POA: Diagnosis present

## 2020-06-16 DIAGNOSIS — I10 Essential (primary) hypertension: Secondary | ICD-10-CM

## 2020-06-16 DIAGNOSIS — E1122 Type 2 diabetes mellitus with diabetic chronic kidney disease: Secondary | ICD-10-CM | POA: Diagnosis present

## 2020-06-16 DIAGNOSIS — I5042 Chronic combined systolic (congestive) and diastolic (congestive) heart failure: Secondary | ICD-10-CM | POA: Diagnosis present

## 2020-06-16 DIAGNOSIS — E1152 Type 2 diabetes mellitus with diabetic peripheral angiopathy with gangrene: Secondary | ICD-10-CM

## 2020-06-16 DIAGNOSIS — Z992 Dependence on renal dialysis: Secondary | ICD-10-CM | POA: Diagnosis not present

## 2020-06-16 DIAGNOSIS — Z7982 Long term (current) use of aspirin: Secondary | ICD-10-CM | POA: Diagnosis not present

## 2020-06-16 DIAGNOSIS — Z794 Long term (current) use of insulin: Secondary | ICD-10-CM | POA: Diagnosis not present

## 2020-06-16 DIAGNOSIS — K2970 Gastritis, unspecified, without bleeding: Secondary | ICD-10-CM | POA: Diagnosis present

## 2020-06-16 DIAGNOSIS — Z885 Allergy status to narcotic agent status: Secondary | ICD-10-CM

## 2020-06-16 DIAGNOSIS — Z91030 Bee allergy status: Secondary | ICD-10-CM

## 2020-06-16 DIAGNOSIS — Z89511 Acquired absence of right leg below knee: Secondary | ICD-10-CM | POA: Diagnosis not present

## 2020-06-16 LAB — CBC WITH DIFFERENTIAL/PLATELET
Abs Immature Granulocytes: 0.02 10*3/uL (ref 0.00–0.07)
Basophils Absolute: 0 10*3/uL (ref 0.0–0.1)
Basophils Relative: 0 %
Eosinophils Absolute: 0.1 10*3/uL (ref 0.0–0.5)
Eosinophils Relative: 2 %
HCT: 39.4 % (ref 39.0–52.0)
Hemoglobin: 12 g/dL — ABNORMAL LOW (ref 13.0–17.0)
Immature Granulocytes: 0 %
Lymphocytes Relative: 23 %
Lymphs Abs: 1.7 10*3/uL (ref 0.7–4.0)
MCH: 26.2 pg (ref 26.0–34.0)
MCHC: 30.5 g/dL (ref 30.0–36.0)
MCV: 86 fL (ref 80.0–100.0)
Monocytes Absolute: 0.5 10*3/uL (ref 0.1–1.0)
Monocytes Relative: 6 %
Neutro Abs: 5.2 10*3/uL (ref 1.7–7.7)
Neutrophils Relative %: 69 %
Platelets: 277 10*3/uL (ref 150–400)
RBC: 4.58 MIL/uL (ref 4.22–5.81)
RDW: 18.8 % — ABNORMAL HIGH (ref 11.5–15.5)
WBC: 7.6 10*3/uL (ref 4.0–10.5)
nRBC: 0 % (ref 0.0–0.2)

## 2020-06-16 LAB — GLUCOSE, CAPILLARY
Glucose-Capillary: 187 mg/dL — ABNORMAL HIGH (ref 70–99)
Glucose-Capillary: 188 mg/dL — ABNORMAL HIGH (ref 70–99)
Glucose-Capillary: 209 mg/dL — ABNORMAL HIGH (ref 70–99)

## 2020-06-16 LAB — CBG MONITORING, ED: Glucose-Capillary: 261 mg/dL — ABNORMAL HIGH (ref 70–99)

## 2020-06-16 LAB — COMPREHENSIVE METABOLIC PANEL
ALT: 13 U/L (ref 0–44)
AST: 12 U/L — ABNORMAL LOW (ref 15–41)
Albumin: 3 g/dL — ABNORMAL LOW (ref 3.5–5.0)
Alkaline Phosphatase: 152 U/L — ABNORMAL HIGH (ref 38–126)
Anion gap: 11 (ref 5–15)
BUN: 24 mg/dL — ABNORMAL HIGH (ref 6–20)
CO2: 25 mmol/L (ref 22–32)
Calcium: 8 mg/dL — ABNORMAL LOW (ref 8.9–10.3)
Chloride: 97 mmol/L — ABNORMAL LOW (ref 98–111)
Creatinine, Ser: 3.86 mg/dL — ABNORMAL HIGH (ref 0.61–1.24)
GFR, Estimated: 17 mL/min — ABNORMAL LOW (ref >60)
Glucose, Bld: 267 mg/dL — ABNORMAL HIGH (ref 70–99)
Potassium: 4 mmol/L (ref 3.5–5.1)
Sodium: 133 mmol/L — ABNORMAL LOW (ref 135–145)
Total Bilirubin: 0.7 mg/dL (ref 0.3–1.2)
Total Protein: 7.5 g/dL (ref 6.5–8.1)

## 2020-06-16 LAB — LIPASE, BLOOD: Lipase: 1092 U/L — ABNORMAL HIGH (ref 11–51)

## 2020-06-16 LAB — PROTIME-INR
INR: 1 (ref 0.8–1.2)
Prothrombin Time: 12.8 seconds (ref 11.4–15.2)

## 2020-06-16 LAB — SARS CORONAVIRUS 2 (TAT 6-24 HRS): SARS Coronavirus 2: NEGATIVE

## 2020-06-16 LAB — MAGNESIUM: Magnesium: 1.8 mg/dL (ref 1.7–2.4)

## 2020-06-16 MED ORDER — HYDROMORPHONE HCL 1 MG/ML IJ SOLN
1.0000 mg | Freq: Once | INTRAMUSCULAR | Status: AC
  Administered 2020-06-16: 1 mg via INTRAVENOUS
  Filled 2020-06-16: qty 1

## 2020-06-16 MED ORDER — ACETAMINOPHEN 650 MG RE SUPP: 650.0000 mg | Freq: Four times a day (QID) | RECTAL | Status: DC | PRN

## 2020-06-16 MED ORDER — HYDROMORPHONE HCL 1 MG/ML IJ SOLN
0.5000 mg | INTRAMUSCULAR | Status: DC | PRN
  Administered 2020-06-16 – 2020-06-17 (×3): 0.5 mg via INTRAVENOUS
  Filled 2020-06-16 (×3): qty 0.5

## 2020-06-16 MED ORDER — SODIUM CHLORIDE 0.9 % IV SOLN: INTRAVENOUS | Status: DC

## 2020-06-16 MED ORDER — ACETAMINOPHEN 325 MG PO TABS: 650.0000 mg | ORAL_TABLET | Freq: Four times a day (QID) | ORAL | Status: DC | PRN

## 2020-06-16 MED ORDER — INSULIN ASPART 100 UNIT/ML ~~LOC~~ SOLN
0.0000 [IU] | SUBCUTANEOUS | Status: DC
  Administered 2020-06-16 – 2020-06-17 (×2): 2 [IU] via SUBCUTANEOUS

## 2020-06-16 MED ORDER — PANTOPRAZOLE SODIUM 40 MG IV SOLR
40.0000 mg | Freq: Two times a day (BID) | INTRAVENOUS | Status: DC
  Administered 2020-06-16 – 2020-06-17 (×2): 40 mg via INTRAVENOUS
  Filled 2020-06-16 (×2): qty 40

## 2020-06-16 MED ORDER — SODIUM CHLORIDE 0.9 % IV BOLUS
500.0000 mL | Freq: Once | INTRAVENOUS | Status: AC
  Administered 2020-06-16: 500 mL via INTRAVENOUS

## 2020-06-16 MED ORDER — HYDROMORPHONE HCL 1 MG/ML IJ SOLN
1.0000 mg | Freq: Once | INTRAMUSCULAR | Status: AC
Start: 2020-06-16 — End: 2020-06-16
  Administered 2020-06-16: 1 mg via INTRAVENOUS
  Filled 2020-06-16: qty 1

## 2020-06-16 NOTE — ED Triage Notes (Signed)
Pt was at dialysis today, couldn't complete it due to n/v/d. Pt vomited 4 x this am.  Pt also c/o of black stools x 2 months.
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
## 2020-06-16 NOTE — H&P (Signed)
History and Physical    Zahn [name] DOB: 06/26/1960 DOA: 06/16/2020  PCP: Pcp, No  Patient coming from: Home  I have personally briefly reviewed patient's old medical records in [name]  Chief Complaint: Abdominal pain  HPI: Zachary Young is a 60 y.o. male with medical history significant of end-stage renal disease on hemodialysis, was recently in the hospital, admitted on 2/27 for acute pancreatitis. The patient left AGAINST MEDICAL ADVICE on 2/28. He reports that he felt well for 1 day after which abdominal pain returned. He describes this as periumbilical pain. He has associated nausea and vomiting. Reports having approximately 3-4 episodes of vomiting yesterday. He is also described dark-colored stools. Describes them as black and tarry. On last admission, it was reported that he had hematemesis at that time. Unfortunately, he left the hospital before GI work-up could be pursued. He had gone to the dialysis center today, but was sent to the emergency room when he reported abdominal pain.  ED Course: Noted to have elevated lipase. Remainder of labs were relatively unrevealing. Vitals were stable. Due to persistent symptoms, he has been referred for admission.  Review of Systems:  Review of Systems  Constitutional: Negative for chills and fever.  HENT: Negative for congestion and sore throat.   Respiratory: Negative for cough and shortness of breath.   Cardiovascular: Negative for chest pain.  Gastrointestinal: Positive for abdominal pain, melena, nausea and vomiting.  Genitourinary: Negative for dysuria.  Neurological: Negative for focal weakness and weakness.      Past Medical History:  Diagnosis Date  . CHF (congestive heart failure) ([place])   . Diabetes mellitus without complication ([place])   . Dialysis patient ([place])   . Peripheral edema   . Renal disorder    kidney disease    Past Surgical History:  Procedure Laterality Date  . AMPUTATION Right  10/21/2019   Procedure: RIGHT BELOW KNEE AMPUTATION;  Surgeon: Newt Minion, MD;  Location: [place];  Service: Orthopedics;  Laterality: Right;  . AV FISTULA PLACEMENT Left 11/13/2019   Procedure: LEFT ARTERIOVENOUS (AV) FISTULA VERSUS ARTERIOVENOUS GRAFT;  Surgeon: Rosetta Posner, MD;  Location: [facility] OR;  Service: Vascular;  Laterality: Left;  . AV FISTULA PLACEMENT Left 04/05/2020   Procedure: INSERTION OF LEFT ARM ARTERIOVENOUS (AV) GORE-TEX GRAFT;  Surgeon: Rosetta Posner, MD;  Location: [place];  Service: Vascular;  Laterality: Left;  . BASCILIC VEIN TRANSPOSITION Left 01/27/2020   Procedure: LEFT ARM 2ND STAGE BASCILIC VEIN TRANSPOSITION;  Surgeon: Rosetta Posner, MD;  Location: [place];  Service: Vascular;  Laterality: Left;  . DIALYSIS/PERMA CATHETER INSERTION N/A 03/03/2020   Procedure: DIALYSIS/PERMA CATHETER INSERTION;  Surgeon: Algernon Huxley, MD;  Location: [place] CV LAB;  Service: Cardiovascular;  Laterality: N/A;  . INSERTION OF DIALYSIS CATHETER N/A 04/05/2020   Procedure: INSERTION OF TUNNEL  DIALYSIS CATHETER LEFT INTERNAL JUGULAR;  Surgeon: Rosetta Posner, MD;  Location: [place];  Service: Vascular;  Laterality: N/A;  . IR FLUORO GUIDE CV LINE RIGHT  11/05/2019  . IR FLUORO GUIDE CV LINE RIGHT  06/07/2020  . IR REMOVAL TUN CV CATH W/O FL  06/01/2020  . IR THROMBECTOMY AV FISTULA W/THROMBOLYSIS/PTA INC/SHUNT/IMG LEFT Left 06/06/2020  . IR US GUIDE VASC ACCESS LEFT  06/06/2020  . IR US GUIDE VASC ACCESS RIGHT  11/05/2019  . IR US GUIDE VASC ACCESS RIGHT  06/07/2020  . REMOVAL OF A DIALYSIS CATHETER Right 04/05/2020   Procedure: REMOVAL OF RIGHT CHEST  DIALYSIS CATHETER;  Surgeon: Rosetta Posner, MD;  Location: [place];  Service: Vascular;  Laterality: Right;  . TOE AMPUTATION Bilateral    due to osteomyelitis, all 5 each foot    Social History:  reports that he has been smoking cigarettes. He has been smoking about 0.50 packs per day. He has never used smokeless tobacco. He reports previous  alcohol use. He reports previous drug use. Drug: Cocaine.  Allergies  Allergen Reactions  . Bee Venom Anaphylaxis    Per pt, nearly died from bee sting as a child  . Propofol Other (See Comments)     Hallucinations  . Morphine Nausea And Vomiting  . Oxycodone Nausea And Vomiting    Family History  Problem Relation Age of Onset  . Cancer Mother        uknown type of cancer  . Colon cancer Neg Hx   . Colon polyps Neg Hx      Prior to Admission medications   Medication Sig Start Date End Date Taking? Authorizing Provider  albuterol (VENTOLIN HFA) 108 (90 Base) MCG/ACT inhaler Inhale 1 puff into the lungs every 4 (four) hours as needed for wheezing or shortness of breath. 08/20/19  Yes Gladys Damme, MD  aspirin EC 81 MG EC tablet Take 1 tablet (81 mg total) by mouth daily. 08/21/19  Yes Gladys Damme, MD  ergocalciferol (VITAMIN D2) 1.25 MG (50000 UT) capsule Take 1 capsule (50,000 Units total) by mouth every Friday. 12/11/19  Yes Samella Parr, NP  furosemide (LASIX) 80 MG tablet Take 80 mg by mouth 2 (two) times daily. 05/21/20  Yes [provider]  insulin glargine (LANTUS) 100 UNIT/ML injection Inject 0.35 mLs (35 Units total) into the skin at bedtime. Patient taking differently: Inject 25 Units into the skin at bedtime. 12/11/19  Yes Samella Parr, NP  Insulin Pen Needle (PEN NEEDLES 31GX5/16") 31G X 8 MM MISC 1 each by Other route daily. 12/11/19 12/10/20 Yes Samella Parr, NP  Multiple Vitamin (MULTIVITAMIN WITH MINERALS) TABS tablet Take 1 tablet by mouth daily.   Yes [provider]  NOVOLOG FLEXPEN 100 UNIT/ML FlexPen Inject 10 Units into the skin 3 (three) times daily. 05/24/20  Yes [provider]  pregabalin (LYRICA) 50 MG capsule Take 1 capsule (50 mg total) by mouth 2 (two) times daily. 12/11/19  Yes Samella Parr, NP  sevelamer (RENAGEL) 800 MG tablet Take 1,600 mg by mouth 3 (three) times daily with meals.   Yes [provider]   midodrine (PROAMATINE) 10 MG tablet Take 1 tablet (10 mg total) by mouth 3 (three) times daily with meals. Patient not taking: Reported on 06/16/2020 12/11/19   Samella Parr, NP  phenytoin (DILANTIN) 100 MG ER capsule TAKE 3 CAPSULES(300 MG) BY MOUTH AT BEDTIME 06/16/20   Garvin Fila, MD  phenytoin (DILANTIN) 100 MG ER capsule Take 3 capsules (300 mg total) by mouth at bedtime. 06/15/20   Garvin Fila, MD  torsemide (DEMADEX) 100 MG tablet Take 100 mg by mouth daily. On Monday-Wednesday-Friday-Sunday (non-dialysis days) Patient not taking: Reported on 06/16/2020 04/29/20   [provider]    Physical Exam: Vitals:   06/16/20 1400 06/16/20 1430 06/16/20 1500 06/16/20 1526  BP: 120/73 125/78 135/81 (!) 151/87  Pulse: 82 81 80 81  Resp:  '12 12 16  '$ Temp:    97.7 F (36.5 C)  TempSrc:    Oral  SpO2: (!) 86% (!) 89% 93% 97%  Weight:  Height:        Constitutional: NAD, calm, comfortable Eyes: PERRL, lids and conjunctivae normal ENMT: Mucous membranes are moist. Posterior pharynx clear of any exudate or lesions.Normal dentition.  Neck: normal, supple, no masses, no thyromegaly Respiratory: clear to auscultation bilaterally, no wheezing, no crackles. Normal respiratory effort. No accessory muscle use.  Cardiovascular: Regular rate and rhythm, no murmurs / rubs / gallops. 2+ pedal pulses. No carotid bruits.  Abdomen: Soft, periumbilical tenderness, no masses palpated. No hepatosplenomegaly. Bowel sounds positive.  Musculoskeletal: Left below the knee amputation, left transmetatarsal amputation, no lower extremity edema Skin: no rashes, lesions, ulcers. No induration Neurologic: CN 2-12 grossly intact. Sensation intact, DTR normal. Strength 5/5 in all 4.  Psychiatric: Normal judgment and insight. Alert and oriented x 3. Normal mood.    Labs on Admission: I have personally reviewed following labs and imaging studies  CBC: Recent Labs  Lab 06/12/20 2015 06/13/20 0640  06/16/20 1154  WBC 7.9 6.9 7.6  NEUTROABS 5.5  --  5.2  HGB 12.3* 12.5* 12.0*  HCT 39.9 42.1 39.4  MCV 84.9 86.8 86.0  PLT 320 290 [id number]   Basic Metabolic Panel: Recent Labs  Lab 06/12/20 2015 06/13/20 0639 06/13/20 0640 06/16/20 1154  NA 127*  --  135 133*  K 4.1  --  4.3 4.0  CL 96*  --  96* 97*  CO2 26  --  26 25  GLUCOSE 419*  --  409* 267*  BUN 29*  --  31* 24*  CREATININE 4.00*  --  3.84* 3.86*  CALCIUM 8.4*  --  9.2 8.0*  MG 1.8  --  1.9 1.8  PHOS  --  5.5*  --   --    GFR: Estimated Creatinine Clearance: 29.7 mL/min (A) (by C-G formula based on SCr of 3.86 mg/dL (H)). Liver Function Tests: Recent Labs  Lab 06/12/20 2015 06/13/20 0640 06/16/20 1154  AST 12* 57* 12*  ALT '8 29 13  '$ ALKPHOS 99 222* 152*  BILITOT 0.4 0.5 0.7  PROT 7.9 8.6* 7.5  ALBUMIN 3.0* 3.6 3.0*   Recent Labs  Lab 06/12/20 1952 06/16/20 1154  LIPASE 780* 1,092*   No results for input(s): AMMONIA in the last 168 hours. Coagulation Profile: Recent Labs  Lab 06/12/20 2015 06/13/20 0640 06/16/20 1154  INR 1.0 1.1 1.0   Cardiac Enzymes: No results for input(s): CKTOTAL, CKMB, CKMBINDEX, TROPONINI in the last 168 hours. BNP (last 3 results) No results for input(s): PROBNP in the last 8760 hours. HbA1C: No results for input(s): HGBA1C in the last 72 hours. CBG: Recent Labs  Lab 06/13/20 0745 06/16/20 1114  GLUCAP 364* 261*   Lipid Profile: No results for input(s): CHOL, HDL, LDLCALC, TRIG, CHOLHDL, LDLDIRECT in the last 72 hours. Thyroid Function Tests: No results for input(s): TSH, T4TOTAL, FREET4, T3FREE, THYROIDAB in the last 72 hours. Anemia Panel: No results for input(s): VITAMINB12, FOLATE, FERRITIN, TIBC, IRON, RETICCTPCT in the last 72 hours. Urine analysis:    Component Value Date/Time   COLORURINE YELLOW 08/15/2019 1538   APPEARANCEUR CLEAR 08/15/2019 1538   LABSPEC 1.010 08/15/2019 1538   PHURINE 5.0 08/15/2019 1538   GLUCOSEU NEGATIVE 08/15/2019 1538   HGBUR  SMALL (A) 08/15/2019 1538   BILIRUBINUR NEGATIVE 08/15/2019 1538   KETONESUR NEGATIVE 08/15/2019 1538   PROTEINUR >=300 (A) 08/15/2019 1538   NITRITE NEGATIVE 08/15/2019 1538   LEUKOCYTESUR NEGATIVE 08/15/2019 1538    Radiological Exams on Admission: No results found.  EKG: Not available  for review  Assessment/Plan Active Problems:   GI bleed   HLD (hyperlipidemia)   HTN (hypertension)   Type 2 diabetes mellitus with circulatory disorder, with long-term current use of insulin (HCC)   S/P BKA (below knee amputation), right (HCC)   Acute pancreatitis   Pancreatitis   ESRD (end stage renal disease) ([place])     Acute pancreatitis -Denies any history of alcohol use -Previous abdominal imaging did not indicate any biliary etiology -He is already status post cholecystectomy -Treat supportively with bowel rest and pain management -We will provide gentle hydration since he is ESRD  ESRD -Tuesday, Thursday, Saturday -Scheduled for dialysis today, but did not receive dialysis since his center was concerned about his abdominal issues and he was sent to the ER -No signs of volume overload at this time. Electrolytes are acceptable -Nephrology informed of patient's admission  GI bleed -Reports frequent dark/tarry stools -On last admission, he had hematemesis -It appears that on several admissions, GI intervention was initially planned, but patient left [place] -At this point, he does wish to pursue inpatient endoscopic evaluation if indicated -It is reassuring that his hemoglobin has remained relatively stable -We will start on Protonix -GI consult in a.m. -Holding aspirin Diabetes -Reduced dose Lantus while he is n.p.o. -Continue on sliding scale insulin  Hyperlipidemia -Continue statin  DVT prophylaxis: SCDs Code Status: Full code, confirmed with patient Family Communication: No family present Disposition Plan: Discharge home once symptoms have  improved Consults called: Gastroenterology Admission status: Inpatient, MedSurg  Kathie Dike MD [facility]   If 7PM-7AM, please contact night-coverage [email or web address]   06/16/2020, 6:33 PM

## 2020-06-16 NOTE — Progress Notes (Signed)
Social Worker sent secure chat for follow up tomorrow in regards to concerns the pt has at current residence.

## 2020-06-16 NOTE — Telephone Encounter (Signed)
Noted, patient is scheduled at [facility] for Tuesday 06/28/20 to arrive at 2:30 pm. I spoke to the patient and he is aware of time & day.

## 2020-06-17 ENCOUNTER — Inpatient Hospital Stay (HOSPITAL_COMMUNITY): Payer: Medicaid Other | Admitting: Certified Registered Nurse Anesthetist

## 2020-06-17 ENCOUNTER — Encounter (HOSPITAL_COMMUNITY): Payer: Self-pay | Admitting: Internal Medicine

## 2020-06-17 ENCOUNTER — Telehealth: Payer: Self-pay | Admitting: Nurse Practitioner

## 2020-06-17 ENCOUNTER — Encounter (HOSPITAL_COMMUNITY)
Admission: EM | Discharge status: Against Medical Advice | Payer: Self-pay | Source: Home / Self Care | Attending: Internal Medicine

## 2020-06-17 DIAGNOSIS — Z992 Dependence on renal dialysis: Secondary | ICD-10-CM

## 2020-06-17 DIAGNOSIS — K859 Acute pancreatitis without necrosis or infection, unspecified: Secondary | ICD-10-CM

## 2020-06-17 HISTORY — PX: ESOPHAGOGASTRODUODENOSCOPY (EGD) WITH PROPOFOL: SHX5813

## 2020-06-17 LAB — BASIC METABOLIC PANEL
Anion gap: 12 (ref 5–15)
BUN: 24 mg/dL — ABNORMAL HIGH (ref 6–20)
CO2: 24 mmol/L (ref 22–32)
Calcium: 8.3 mg/dL — ABNORMAL LOW (ref 8.9–10.3)
Chloride: 103 mmol/L (ref 98–111)
Creatinine, Ser: 4.16 mg/dL — ABNORMAL HIGH (ref 0.61–1.24)
GFR, Estimated: 16 mL/min — ABNORMAL LOW (ref >60)
Glucose, Bld: 204 mg/dL — ABNORMAL HIGH (ref 70–99)
Potassium: 4.1 mmol/L (ref 3.5–5.1)
Sodium: 139 mmol/L (ref 135–145)

## 2020-06-17 LAB — CBC
HCT: 40.4 % (ref 39.0–52.0)
Hemoglobin: 12.2 g/dL — ABNORMAL LOW (ref 13.0–17.0)
MCH: 26.2 pg (ref 26.0–34.0)
MCHC: 30.2 g/dL (ref 30.0–36.0)
MCV: 86.9 fL (ref 80.0–100.0)
Platelets: 281 10*3/uL (ref 150–400)
RBC: 4.65 MIL/uL (ref 4.22–5.81)
RDW: 18.7 % — ABNORMAL HIGH (ref 11.5–15.5)
WBC: 5.8 10*3/uL (ref 4.0–10.5)
nRBC: 0 % (ref 0.0–0.2)

## 2020-06-17 LAB — GLUCOSE, CAPILLARY
Glucose-Capillary: 187 mg/dL — ABNORMAL HIGH (ref 70–99)
Glucose-Capillary: 167 mg/dL — ABNORMAL HIGH (ref 70–99)
Glucose-Capillary: 141 mg/dL — ABNORMAL HIGH (ref 70–99)
Glucose-Capillary: 120 mg/dL — ABNORMAL HIGH (ref 70–99)

## 2020-06-17 LAB — LIPASE, BLOOD: Lipase: 1083 U/L — ABNORMAL HIGH (ref 11–51)

## 2020-06-17 SURGERY — ESOPHAGOGASTRODUODENOSCOPY (EGD) WITH PROPOFOL
Anesthesia: General

## 2020-06-17 MED ORDER — PROPOFOL 10 MG/ML IV BOLUS
INTRAVENOUS | Status: DC | PRN
  Administered 2020-06-17: 100 mg via INTRAVENOUS

## 2020-06-17 MED ORDER — SODIUM CHLORIDE 0.9 % IV SOLN: INTRAVENOUS | Status: DC

## 2020-06-17 MED ORDER — MIDAZOLAM HCL 2 MG/2ML IJ SOLN
INTRAMUSCULAR | Status: DC | PRN
  Administered 2020-06-17: 1 mg via INTRAVENOUS

## 2020-06-17 MED ORDER — MIDAZOLAM HCL 2 MG/2ML IJ SOLN
INTRAMUSCULAR | Status: AC
  Filled 2020-06-17: qty 2

## 2020-06-17 MED ORDER — LIDOCAINE HCL (CARDIAC) PF 100 MG/5ML IV SOSY
PREFILLED_SYRINGE | INTRAVENOUS | Status: DC | PRN
  Administered 2020-06-17: 50 mg via INTRAVENOUS

## 2020-06-17 MED ORDER — LACTATED RINGERS IV SOLN: INTRAVENOUS | Status: DC | PRN

## 2020-06-17 MED ORDER — CHLORHEXIDINE GLUCONATE CLOTH 2 % EX PADS
6.0000 | MEDICATED_PAD | Freq: Every day | CUTANEOUS | Status: DC
  Administered 2020-06-17: 6 via TOPICAL

## 2020-06-17 MED ORDER — CHLORHEXIDINE GLUCONATE CLOTH 2 % EX PADS: 6.0000 | MEDICATED_PAD | Freq: Every day | CUTANEOUS | Status: DC

## 2020-06-17 MED ORDER — STERILE WATER FOR IRRIGATION IR SOLN
Status: DC | PRN
  Administered 2020-06-17: 100 mL

## 2020-06-17 NOTE — Anesthesia Preprocedure Evaluation (Addendum)
Anesthesia Evaluation  Patient identified by MRN, date of birth, ID band Patient awake    Reviewed: Allergy & Precautions, NPO status , Patient's Chart, lab work & pertinent test results  History of Anesthesia Complications Negative for: history of anesthetic complications  Airway Mallampati: III  TM Distance: >3 FB Neck ROM: Full    Dental  (+) Dental Advisory Given, Edentulous Upper, Poor Dentition, Missing   Pulmonary shortness of breath and with exertion, Current Smoker and Patient abstained from smoking.,    Pulmonary exam normal breath sounds clear to auscultation       Cardiovascular Exercise Tolerance: Poor hypertension, Pt. on medications + Past MI, + Peripheral Vascular Disease and +CHF  Normal cardiovascular exam+ dysrhythmias (prolonged QT)  Rhythm:Regular Rate:Normal  1. Extremely limited; definity used; low normal LV systolic function; mild LVH; mld LVE.  2. Left ventricular ejection fraction, by estimation, is 50 to 55%. The  left ventricle has low normal function. The left ventricle has no regional  wall motion abnormalities. The left ventricular internal cavity size was mildly dilated. There is mild left ventricular hypertrophy. Left ventricular diastolic parameters are indeterminate.  3. Right ventricular systolic function was not well visualized. The right ventricular size is not well visualized.  4. The mitral valve is normal in structure. No evidence of mitral valve regurgitation. No evidence of mitral stenosis.  5. The aortic valve is tricuspid. Aortic valve regurgitation is not visualized. Mild aortic valve sclerosis is present, with no evidence of aortic valve stenosis.  12-Jun-2020 19:48:19 [facility]-ED ROUTINE RECORD Sinus rhythm Left ventricular hypertrophy Anterior infarct, old Prolonged QT interval No significant change since prior 2/22 Confirmed by Aletta Edouard [phone]) on  06/12/2020 7:56:38 PM Also confirmed by Aletta Edouard [id number]), editor [address], [place] [phone]) on 06/13/2020 9:43:39 AM    Neuro/Psych  Neuromuscular disease    GI/Hepatic neg GERD  Medicated,(+)     substance abuse  cocaine use, Pancreatitis  Hematemesis     Endo/Other  diabetes, Well Controlled, Type 2, Insulin Dependent  Renal/GU ESRF and DialysisRenal disease  negative genitourinary   Musculoskeletal   Abdominal   Peds  Hematology  (+) anemia ,   Anesthesia Other Findings   Reproductive/Obstetrics                            Anesthesia Physical Anesthesia Plan  ASA: IV  Anesthesia Plan: General   Post-op Pain Management:    Induction: Intravenous  PONV Risk Score and Plan: Propofol infusion  Airway Management Planned: Nasal Cannula and Natural Airway  Additional Equipment:   Intra-op Plan:   Post-operative Plan:   Informed Consent: I have reviewed the patients History and Physical, chart, labs and discussed the procedure including the risks, benefits and alternatives for the proposed anesthesia with the patient or authorized representative who has indicated his/her understanding and acceptance.     Dental advisory given  Plan Discussed with: CRNA and Surgeon  Anesthesia Plan Comments: (Patient received propofol multiple times before, denied any reactions to anesthesia medications. Will use propofol during the procedure. )        Anesthesia Quick Evaluation

## 2020-06-17 NOTE — Progress Notes (Signed)
Pt's IV site infiltrated on first rounds, removed. ICU nurse up to reinsert using ultrasound, as pt is very hard stick and is limb restricted on left. Pt still upset that he can't eat or drink. Nephrologist in earlier and advised that GI services will decide on diet. GI in now and advised pt of planned upper endoscopy today, so continued NPO till procedure completed. Pt states understanding and is agreeable.

## 2020-06-17 NOTE — Progress Notes (Signed)
Pt escorted to main lobby via [place] to wait for his brother who is coming to pick him up. Pt advised to leave hospital's wheelchair with either valet parking or front lobby attendants once his brother arrives with his personal wheelchair. Pt states understanding.

## 2020-06-17 NOTE — Progress Notes (Signed)
Pt down to day hospital for EGD via stretcher by day hospital staff.

## 2020-06-17 NOTE — Progress Notes (Signed)
Pt back from procedure at 1230. VSS. Currently, tolerating clear liquids without any c/o n/v. Pt asking to go home.  Pt states he does not want dialysis today because he will go tomorrow on his regular day. Dr. Roderic Palau in to see patient at this time.

## 2020-06-17 NOTE — Discharge Summary (Signed)
Physician Discharge Summary  Zachary Young VQQ:[id number] DOB: 1960-12-30 DOA: 06/16/2020  PCP: Pcp, No  Admit date: 06/16/2020 Discharge date: 06/17/2020  Admitted From: Home Disposition: [place]  Brief/Interim Summary: 60 year old male with a history of end-stage renal disease on hemodialysis, was recently admitted to the hospital on 2/27 for acute pancreatitis.  He left the hospital [facility] on 2/28.  He returned to the hospital on 3/3 with recurrent abdominal pain, and vomiting.  Reports that his symptoms began the day after he left the hospital.  Denies any alcohol use.  Lipase was checked and found to be elevated consistent with pancreatitis.  Patient was provided bowel rest and pain medications.  Overall his symptoms improved by the following day he was tolerating liquids.  He did describe melena and hematemesis.  He was seen by GI and underwent EGD that indicated gastritis but no evidence of obvious bleeding.  Patient was advised that his diet would be slowly advanced and he would need to be monitored for recurrence of symptoms.  He did not wish to stay in the hospital and left [place].  Discharge Diagnoses:  Active Problems:   GI bleed   HLD (hyperlipidemia)   HTN (hypertension)   Type 2 diabetes mellitus with circulatory disorder, with long-term current use of insulin (HCC)   S/P BKA (below knee amputation), right (HCC)   Acute pancreatitis   Pancreatitis   ESRD (end stage renal disease) ([place])    Discharge Instructions   Allergies as of 06/17/2020      Reactions   Bee Venom Anaphylaxis   Per pt, nearly died from bee sting as a child   Propofol Other (See Comments)    Hallucinations   Morphine Nausea And Vomiting   Oxycodone Nausea And Vomiting      Medication List    ASK your doctor about these medications   albuterol 108 (90 Base) MCG/ACT inhaler Commonly known as: VENTOLIN HFA Inhale 1 puff  into the lungs every 4 (four) hours as needed for wheezing or shortness of breath.   aspirin 81 MG EC tablet Take 1 tablet (81 mg total) by mouth daily.   ergocalciferol 1.25 MG (50000 UT) capsule Commonly known as: VITAMIN D2 Take 1 capsule (50,000 Units total) by mouth every Friday.   furosemide 80 MG tablet Commonly known as: LASIX Take 80 mg by mouth 2 (two) times daily.   insulin glargine 100 UNIT/ML injection Commonly known as: LANTUS Inject 0.35 mLs (35 Units total) into the skin at bedtime.   midodrine 10 MG tablet Commonly known as: PROAMATINE Take 1 tablet (10 mg total) by mouth 3 (three) times daily with meals.   multivitamin with minerals Tabs tablet Take 1 tablet by mouth daily.   NovoLOG FlexPen 100 UNIT/ML FlexPen Generic drug: insulin aspart Inject 10 Units into the skin 3 (three) times daily.   PEN NEEDLES 31GX5/16" 31G X 8 MM Misc 1 each by Other route daily.   phenytoin 100 MG ER capsule Commonly known as: DILANTIN Take 3 capsules (300 mg total) by mouth at bedtime.   phenytoin 100 MG ER capsule Commonly known as: DILANTIN TAKE 3 CAPSULES(300 MG) BY MOUTH AT BEDTIME   pregabalin 50 MG capsule Commonly known as: LYRICA Take 1 capsule (50 mg total) by mouth 2 (two) times daily.   sevelamer 800 MG tablet Commonly known as: RENAGEL Take 1,600 mg by mouth 3 (three) times daily with meals.  torsemide 100 MG tablet Commonly known as: DEMADEX Take 100 mg by mouth daily. On Monday-Wednesday-Friday-Sunday (non-dialysis days)       Allergies  Allergen Reactions  . Bee Venom Anaphylaxis    Per pt, nearly died from bee sting as a child  . Propofol Other (See Comments)     Hallucinations  . Morphine Nausea And Vomiting  . Oxycodone Nausea And Vomiting    Consultations:  Gastroenterology  Nephrology   Procedures/Studies: CT ABDOMEN PELVIS WO CONTRAST  Result Date: 06/12/2020 CLINICAL DATA:  Acute nonlocalized abdominal pain. Vomiting.  Renal failure. Dialysis patient. EXAM: CT ABDOMEN AND PELVIS WITHOUT CONTRAST TECHNIQUE: Multidetector CT imaging of the abdomen and pelvis was performed following the standard protocol without IV contrast. COMPARISON:  12/14/2018 FINDINGS: Lower chest: Interstitial and early alveolar pattern in the lower lungs consistent with heart failure or fluid overload. No consolidation or collapse. No pleural effusion. Hepatobiliary: Liver parenchyma appears normal without contrast. Previous cholecystectomy. Pancreas: There appears to be mild swelling with surrounding edema in the region of the body of the pancreas suggesting early or mild pancreatitis. Spleen: Normal Adrenals/Urinary Tract: Adrenal glands are normal. Kidneys are normal. Bladder is normal. Stomach/Bowel: Stomach and small intestine are normal. Normal appendix. No colon abnormality seen. Vascular/Lymphatic: Aortic atherosclerosis. No aneurysm. IVC is normal. No adenopathy. Reproductive: Normal Other: No free fluid or air. Musculoskeletal: Right inguinal hernia containing fat. Mild lower lumbar degenerative changes. IMPRESSION: Lung bases show interstitial and early alveolar edema consistent with fluid overload. Mild swelling of the body of the pancreas with mild surrounding edema consistent with early/mild pancreatitis. Electronically Signed   By: Nelson Chimes M.D.   On: 06/12/2020 21:56   IR Removal Tun Cv Cath W/O FL  Result Date: 06/01/2020 INDICATION: Patient with history of end-stage renal disease on dialysis via tunneled HD catheter placed 11/06/2019 by Dr. Earleen Newport. He now has a working AV graft and is no longer in need of his tunneled dialysis catheter. Request is made for removal. EXAM: REMOVAL TUNNELED CENTRAL VENOUS CATHETER MEDICATIONS: 10 mL 1% lidocaine ANESTHESIA/SEDATION: None FLUOROSCOPY TIME:  None COMPLICATIONS: None immediate. PROCEDURE: Informed written consent was obtained from the patient after a thorough discussion of the procedural  risks, benefits and alternatives. All questions were addressed. Maximal Sterile Barrier Technique was utilized including caps, mask, sterile gowns, sterile gloves, sterile drape, hand hygiene and skin antiseptic. A timeout was performed prior to the initiation of the procedure. The patient's right chest and catheter was prepped and draped in a normal sterile fashion. Heparin was removed from both ports of catheter. 1% lidocaine was used for local anesthesia. Using gentle blunt dissection the cuff of the catheter was exposed and the catheter was removed in its entirety. Pressure was held till hemostasis was obtained. A sterile dressing was applied. The patient tolerated the procedure well with no immediate complications. IMPRESSION: Successful catheter removal as described above. Read by: Brynda Greathouse PA-C Electronically Signed   By: Jacqulynn Cadet M.D.   On: 06/01/2020 15:09   IR US Guide Vasc Access Left  Result Date: 06/06/2020 INDICATION: 60 year old male with left axillary to axillary loop arteriovenous hemodialysis graft created 04/05/2020 presenting with graft thrombosis. EXAM: 1. Ultrasound-guided arteriovenous graft access x2 2. Pharmacomechanical thrombolysis of arteriovenous graft 3. Rheolytic thrombectomy of arteriovenous graft 4. Balloon sweep thrombectomy of arterial anastomosis 5. Balloon angioplasty of arteriovenous graft COMPARISON:  None. MEDICATIONS: None. CONTRAST:  42m OMNIPAQUE IOHEXOL 300 MG/ML  SOLN ANESTHESIA/SEDATION: Moderate (conscious) sedation was employed during this  procedure. A total of Versed 1.5 mg and Fentanyl 75 mcg was administered intravenously. Moderate Sedation Time: 99 minutes. The patient's level of consciousness and vital signs were monitored continuously by radiology nursing throughout the procedure under my direct supervision. FLUOROSCOPY TIME:  19 minutes, 18 seconds, 81 mGy COMPLICATIONS: None immediate. PROCEDURE: Informed written consent was obtained from  the patient after a discussion of the risk, benefits and alternatives to treatment. Questions regarding the procedure were encouraged and answered. A timeout was performed prior to the initiation of the procedure. The left arm dialysis graft was prepped with Chlorhexidine in a sterile fashion, and a sterile drape was applied covering the operative field. Ultrasound evaluation of the entire graft was performed. Antegrade access near the graft apex was performed under ultrasound guidance with a micropuncture set. Over a Wholey wire, a 6 French sheath was placed. A Glidewire and angled catheter were directed to the left subclavian vein. Pull-back venogram was performed. Pharmacomechanical thrombolysis was performed of the venous outflow portion of the graft. Retrograde access was then obtained under ultrasound guidance with a micropuncture set. Over a Wholey wire, a 6 French sheath was placed. A Glidewire and angled catheter were directed to the axillary artery. Pharmacomechanical thrombolysis was performed in the arterial limb of the graft. Using a Fogarty balloon, balloon sweep was performed of the arterial anastomosis 3 times. Rheolytic thrombectomy was then performed throughout the arterial limb of the graft. This was followed by rheolytic therapy throughout the venous aspect of the graft. Fistulagram from the axillary artery demonstrated severe focal stenosis near the arterial anastomosis. Balloon angioplasty was performed at the stenotic site with a 4 mm x 2 cm mustang balloon. There is improved patency inflow after angioplasty. Additional moderate stenosis was noted in the graft apex. Balloon angioplasty with a 7 mm x 4 cm mustang balloon was performed. There are multifocal stenoses near the axillary vein anastomosis. Balloon angioplasty was performed multiple sites. Completion fistulagram was then performed. The sheaths was removed and hemostasis obtained with application of a 2-0 Ethilon pursestring suture  which will be removed at the patient's next dialysis session. Dressings were placed. The patient tolerated the procedure well without immediate postprocedural complication. FINDINGS: Complete thrombosis of the left axillary to axillary loop arteriovenous graft. Technically successful pharmacomechanical thrombolysis and rheolytic thrombectomy throughout the graft. High-grade focal stenosis at the graft arterial anastomosis which improved after angioplasty. Additional angioplasty was required at the graft apex as well as the venous anastomosis. Self-limiting axillary hematoma was observed after wire cannulation cross the venous anastomosis. There is complete cessation of extravasation after balloon tamponade. Upon completion, there was palpable thrill throughout the graft and brisk antegrade flow on completion fistulogram. IMPRESSION: 1. Thrombosed left axillary to axillary loop arteriovenous graft. 2. Technically successful declot of graft with pharmacomechanical thrombolysis, rheolytic thrombectomy, Fogarty arterial anastomosis balloon sweep, and balloon angioplasty. ACCESS: This access remains amenable to future percutaneous interventions as clinically indicated. Ruthann Cancer, MD [facility] Electronically Signed   By: Ruthann Cancer MD   On: 06/06/2020 17:42   IR US Guide Vasc Access Right  Result Date: 06/07/2020 INDICATION: History of end-stage renal disease post attempt at left upper arm declot (performed yesterday 06/06/2020) with early rethrombosis. Patient presents today for image guided placement of a tunneled dialysis catheter. EXAM: TUNNELED CENTRAL VENOUS HEMODIALYSIS CATHETER PLACEMENT WITH ULTRASOUND AND FLUOROSCOPIC GUIDANCE MEDICATIONS: Ancef 2 gm IV . The antibiotic was given in an appropriate time interval prior to skin  puncture. ANESTHESIA/SEDATION: Moderate (conscious) sedation was employed during this procedure. A total of Versed 0.5  mg and Fentanyl 25 mcg was administered intravenously. Moderate Sedation Time: 17 minutes. The patient's level of consciousness and vital signs were monitored continuously by radiology nursing throughout the procedure under my direct supervision. Comparisons: Image guided left upper arm dialysis graft thrombolysis-06/06/2020 FLUOROSCOPY TIME:  30 seconds (16 mGy) COMPLICATIONS: None immediate. PROCEDURE: Informed written consent was obtained from the patient after a discussion of the risks, benefits, and alternatives to treatment. Questions regarding the procedure were encouraged and answered. The right neck and chest were prepped with chlorhexidine in a sterile fashion, and a sterile drape was applied covering the operative field. Maximum barrier sterile technique with sterile gowns and gloves were used for the procedure. A timeout was performed prior to the initiation of the procedure. After creating a small venotomy incision, a micropuncture kit was utilized to access the internal jugular vein. Real-time ultrasound guidance was utilized for vascular access including the acquisition of a permanent ultrasound image documenting patency of the accessed vessel. The microwire was utilized to measure appropriate catheter length. A stiff Glidewire was advanced to the level of the IVC and the micropuncture sheath was exchanged for a peel-away sheath. A palindrome tunneled hemodialysis catheter measuring 19 cm from tip to cuff was tunneled in a retrograde fashion from the anterior chest wall to the venotomy incision. The catheter was then placed through the peel-away sheath with tips ultimately positioned within the superior aspect of the right atrium. Final catheter positioning was confirmed and documented with a spot radiographic image. The catheter aspirates and flushes normally. The catheter was flushed with appropriate volume heparin dwells. The catheter exit site was secured with a 0-Prolene retention suture. The  venotomy incision was closed with Dermabond and Steri-strips. Dressings were applied. The patient tolerated the procedure well without immediate post procedural complication. IMPRESSION: Successful placement of 19 cm tip to cuff tunneled hemodialysis catheter via the right internal jugular vein with tips terminating within the superior aspect of the right atrium. The catheter is ready for immediate use. Electronically Signed   By: Sandi Mariscal M.D.   On: 06/07/2020 14:04   US ABDOMEN LIMITED RUQ (LIVER/GB)  Result Date: 06/13/2020 CLINICAL DATA:  Abdominal pain EXAM: ULTRASOUND ABDOMEN LIMITED RIGHT UPPER QUADRANT COMPARISON:  CT AP 06/12/2020 FINDINGS: Gallbladder: Status post cholecystectomy Common bile duct: Diameter: 3.3 mm Liver: No focal lesion identified. Within normal limits in parenchymal echogenicity. Portal vein is patent on color Doppler imaging with normal direction of blood flow towards the liver. Other: None. IMPRESSION: 1. Normal exam.  No acute findings. 2. Status post cholecystectomy. Electronically Signed   By: Kerby Moors M.D.   On: 06/13/2020 09:04        The results of significant diagnostics from this hospitalization (including imaging, microbiology, ancillary and laboratory) are listed below for reference.     Microbiology: Recent Results (from the past 240 hour(s))  SARS CORONAVIRUS 2 (TAT 6-24 HRS) Nasopharyngeal Nasopharyngeal Swab     Status: None   Collection Time: 06/12/20 11:48 PM   Specimen: Nasopharyngeal Swab  Result Value Ref Range Status   SARS Coronavirus 2 NEGATIVE NEGATIVE Final    Comment: (NOTE) SARS-CoV-2 target nucleic acids are NOT DETECTED.  The SARS-CoV-2 RNA is generally detectable in upper and lower respiratory specimens during the acute phase of infection. Negative results do not preclude SARS-CoV-2 infection, do not rule out co-infections with other pathogens, and should not be used  as the sole basis for treatment or other patient  management decisions. Negative results must be combined with clinical observations, patient history, and epidemiological information. The expected result is Negative.  Fact Sheet for Patients: SugarRoll.be  Fact Sheet for Healthcare Providers: https://www.woods-mathews.com/  This test is not yet approved or cleared by the [place] FDA and  has been authorized for detection and/or diagnosis of SARS-CoV-2 by FDA under an Emergency Use Authorization (EUA). This EUA will remain  in effect (meaning this test can be used) for the duration of the COVID-19 declaration under Se ction 564(b)(1) of the Act, 21 U.S.C. section 360bbb-3(b)(1), unless the authorization is terminated or revoked sooner.  Performed at [facility], [place] [address], [place], [place] [address]   SARS CORONAVIRUS 2 (TAT 6-24 HRS) Nasopharyngeal Nasopharyngeal Swab     Status: None   Collection Time: 06/16/20 11:53 AM   Specimen: Nasopharyngeal Swab  Result Value Ref Range Status   SARS Coronavirus 2 NEGATIVE NEGATIVE Final    Comment: (NOTE) SARS-CoV-2 target nucleic acids are NOT DETECTED.  The SARS-CoV-2 RNA is generally detectable in upper and lower respiratory specimens during the acute phase of infection. Negative results do not preclude SARS-CoV-2 infection, do not rule out co-infections with other pathogens, and should not be used as the sole basis for treatment or other patient management decisions. Negative results must be combined with clinical observations, patient history, and epidemiological information. The expected result is Negative.  Fact Sheet for Patients: SugarRoll.be  Fact Sheet for Healthcare Providers: https://www.woods-mathews.com/  This test is not yet approved or cleared by the [place] FDA and  has been authorized for detection and/or diagnosis of SARS-CoV-2 by FDA under an Emergency Use  Authorization (EUA). This EUA will remain  in effect (meaning this test can be used) for the duration of the COVID-19 declaration under Se ction 564(b)(1) of the Act, 21 U.S.C. section 360bbb-3(b)(1), unless the authorization is terminated or revoked sooner.  Performed at [facility], [place] [address] [place], [place] [address]      Labs: BNP (last 3 results) Recent Labs    08/15/19 0944 08/30/19 2152  BNP 148.2* 69.6   Basic Metabolic Panel: Recent Labs  Lab 06/12/20 2015 06/13/20 0639 06/13/20 0640 06/16/20 1154 06/17/20 0613  NA 127*  --  135 133* 139  K 4.1  --  4.3 4.0 4.1  CL 96*  --  96* 97* 103  CO2 26  --  26 25 24   GLUCOSE 419*  --  409* 267* 204*  BUN 29*  --  31* 24* 24*  CREATININE 4.00*  --  3.84* 3.86* 4.16*  CALCIUM 8.4*  --  9.2 8.0* 8.3*  MG 1.8  --  1.9 1.8  --   PHOS  --  5.5*  --   --   --    Liver Function Tests: Recent Labs  Lab 06/12/20 2015 06/13/20 0640 06/16/20 1154  AST 12* 57* 12*  ALT 8 29 13   ALKPHOS 99 222* 152*  BILITOT 0.4 0.5 0.7  PROT 7.9 8.6* 7.5  ALBUMIN 3.0* 3.6 3.0*   Recent Labs  Lab 06/12/20 1952 06/16/20 1154 06/17/20 0613  LIPASE 780* 1,092* 1,083*   No results for input(s): AMMONIA in the last 168 hours. CBC: Recent Labs  Lab 06/12/20 2015 06/13/20 0640 06/16/20 1154 06/17/20 0613  WBC 7.9 6.9 7.6 5.8  NEUTROABS 5.5  --  5.2  --   HGB 12.3* 12.5* 12.0*  12.2*  HCT 39.9 42.1 39.4 40.4  MCV 84.9 86.8 86.0 86.9  PLT 320 290 277 281   Cardiac Enzymes: No results for input(s): CKTOTAL, CKMB, CKMBINDEX, TROPONINI in the last 168 hours. BNP: Invalid input(s): POCBNP CBG: Recent Labs  Lab 06/16/20 2336 06/17/20 0723 06/17/20 0922 06/17/20 1058 06/17/20 1312  GLUCAP 188* 187* 167* 141* 120*   D-Dimer No results for input(s): DDIMER in the last 72 hours. Hgb A1c No results for input(s): HGBA1C in the last 72 hours. Lipid Profile No results for input(s): CHOL, HDL, LDLCALC, TRIG,  CHOLHDL, LDLDIRECT in the last 72 hours. Thyroid function studies No results for input(s): TSH, T4TOTAL, T3FREE, THYROIDAB in the last 72 hours.  Invalid input(s): FREET3 Anemia work up No results for input(s): VITAMINB12, FOLATE, FERRITIN, TIBC, IRON, RETICCTPCT in the last 72 hours. Urinalysis    Component Value Date/Time   COLORURINE YELLOW 08/15/2019 1538   APPEARANCEUR CLEAR 08/15/2019 1538   LABSPEC 1.010 08/15/2019 1538   PHURINE 5.0 08/15/2019 1538   GLUCOSEU NEGATIVE 08/15/2019 1538   HGBUR SMALL (A) 08/15/2019 1538   BILIRUBINUR NEGATIVE 08/15/2019 1538   KETONESUR NEGATIVE 08/15/2019 1538   PROTEINUR >=300 (A) 08/15/2019 1538   NITRITE NEGATIVE 08/15/2019 1538   LEUKOCYTESUR NEGATIVE 08/15/2019 1538   Sepsis Labs Invalid input(s): PROCALCITONIN,  WBC,  LACTICIDVEN Microbiology Recent Results (from the past 240 hour(s))  SARS CORONAVIRUS 2 (TAT 6-24 HRS) Nasopharyngeal Nasopharyngeal Swab     Status: None   Collection Time: 06/12/20 11:48 PM   Specimen: Nasopharyngeal Swab  Result Value Ref Range Status   SARS Coronavirus 2 NEGATIVE NEGATIVE Final    Comment: (NOTE) SARS-CoV-2 target nucleic acids are NOT DETECTED.  The SARS-CoV-2 RNA is generally detectable in upper and lower respiratory specimens during the acute phase of infection. Negative results do not preclude SARS-CoV-2 infection, do not rule out co-infections with other pathogens, and should not be used as the sole basis for treatment or other patient management decisions. Negative results must be combined with clinical observations, patient history, and epidemiological information. The expected result is Negative.  Fact Sheet for Patients: SugarRoll.be  Fact Sheet for Healthcare Providers: https://www.woods-mathews.com/  This test is not yet approved or cleared by the [place] FDA and  has been authorized for detection and/or diagnosis of SARS-CoV-2  by FDA under an Emergency Use Authorization (EUA). This EUA will remain  in effect (meaning this test can be used) for the duration of the COVID-19 declaration under Se ction 564(b)(1) of the Act, 21 U.S.C. section 360bbb-3(b)(1), unless the authorization is terminated or revoked sooner.  Performed at [facility], [place] [address] [place], [place] [address]   SARS CORONAVIRUS 2 (TAT 6-24 HRS) Nasopharyngeal Nasopharyngeal Swab     Status: None   Collection Time: 06/16/20 11:53 AM   Specimen: Nasopharyngeal Swab  Result Value Ref Range Status   SARS Coronavirus 2 NEGATIVE NEGATIVE Final    Comment: (NOTE) SARS-CoV-2 target nucleic acids are NOT DETECTED.  The SARS-CoV-2 RNA is generally detectable in upper and lower respiratory specimens during the acute phase of infection. Negative results do not preclude SARS-CoV-2 infection, do not rule out co-infections with other pathogens, and should not be used as the sole basis for treatment or other patient management decisions. Negative results must be combined with clinical observations, patient history, and epidemiological information. The expected result is Negative.  Fact Sheet for Patients: SugarRoll.be  Fact Sheet for Healthcare Providers: https://www.woods-mathews.com/  This test is not  yet approved or cleared by the [place] and  has been authorized for detection and/or diagnosis of SARS-CoV-2 by FDA under an Emergency Use Authorization (EUA). This EUA will remain  in effect (meaning this test can be used) for the duration of the COVID-19 declaration under Se ction 564(b)(1) of the Act, 21 U.S.C. section 360bbb-3(b)(1), unless the authorization is terminated or revoked sooner.  Performed at [facility], [place] [address] [place],  [address]      Time coordinating discharge: 33mns  SIGNED:   JKathie Dike MD  [facility] 06/17/2020, 8:44  PM   If 7PM-7AM, please contact night-coverage [email or web address]

## 2020-06-17 NOTE — Plan of Care (Signed)

## 2020-06-17 NOTE — Op Note (Signed)
[facility] Patient Name: Zachary Young Procedure Date: 06/17/2020 11:43 AM MRN: [id number] Date of Birth: [birth year] Attending MD: Elon Alas. Abbey Chatters DO CSN: [id number] Age: 60 Admit Type: Inpatient Procedure:                Upper GI endoscopy Indications:              Hematemesis Providers:                Elon Alas. Abbey Chatters, DO, Jessica Boudreaux, Casimer Bilis, Technician Referring MD:              Medicines:                See the Anesthesia note for documentation of the                            administered medications Complications:            No immediate complications. Estimated Blood Loss:     Estimated blood loss: none. Procedure:                Pre-Anesthesia Assessment:                           - The anesthesia plan was to use monitored                            anesthesia care (MAC).                           After obtaining informed consent, the endoscope was                            passed under direct vision. Throughout the                            procedure, the patient's blood pressure, pulse, and                            oxygen saturations were monitored continuously. The                            [id number]-H190 ([id number]) scope was introduced through the                            mouth, and advanced to the duodenal bulb. The upper                            GI endoscopy was accomplished without difficulty.                            The patient tolerated the procedure well. Scope In: 11:55:20 AM Scope Out: 11:57:26 AM Total Procedure Duration: 0 hours 2 minutes 6 seconds  Findings:      There is no endoscopic evidence of bleeding, areas of erosion,  esophagitis, inflammation, ulcerations or varices in the entire       esophagus.      Diffuse mild inflammation characterized by erythema was found in the       entire examined stomach.      The duodenal bulb, first portion of the duodenum and second portion of        the duodenum were normal. Impression:               - Gastritis.                           - Normal duodenal bulb, first portion of the                            duodenum and second portion of the duodenum.                           - No specimens collected. Moderate Sedation:      Per Anesthesia Care Recommendation:           - Return patient to hospital ward for ongoing care.                           - Resume regular diet.                           - Use a proton pump inhibitor PO daily. Procedure Code(s):        --- Professional ---                           [phone], Esophagogastroduodenoscopy, flexible,                            transoral; diagnostic, including collection of                            specimen(s) by brushing or washing, when performed                            (separate procedure) Diagnosis Code(s):        --- Professional ---                           K29.70, Gastritis, unspecified, without bleeding                           K92.0, Hematemesis CPT copyright 2019 American Medical Association. All rights reserved. The codes documented in this report are preliminary and upon coder review may  be revised to meet current compliance requirements. Elon Alas. Abbey Chatters, DO [place] Abbey Chatters, DO 06/17/2020 12:01:10 PM This report has been signed electronically. Number of Addenda: 0

## 2020-06-17 NOTE — Consult Note (Signed)
Referring Provider: [facility] Primary Care Physician:  Pcp, No Primary Gastroenterologist:  Dr. Abbey Chatters (previously unassigned)  Date of Admission: 06/16/20 Date of Consultation: 06/17/20  Reason for Consultation:  Hematemesis in the setting of pancreatitis with N/V  HPI:  Zachary Young is a 60 y.o. male with a past medical history of CHF, diabetes, ESRD on hemodialysis.  He has a history of possible admissions and leaving AMA.  Inpatient August 2020 at [facility] for hematemesis and left AMA with EGD scheduled but never performed.  Previously in the hospital at this facility for abdominal pain with nausea/vomiting/diarrhea as well as concerns for hematemesis although heme-negative.  Found to have elevated lipase and CT of the abdomen with mild pancreatitis.  Recommended EGD for hematemesis however, the patient again left AMA on 06/13/2020 because he wanted to eat.  He has since returned to the emergency department yesterday.  He states he felt well for about 1 day after leaving the hospital but then had return of periumbilical pain with associated nausea and vomiting.  Has had 3-4 episodes of vomiting the day prior to presentation as well as dark-colored stools.  Also hematemesis recently, as per previous hospitalization.  Unable to complete dialysis yesterday due to his symptoms and was sent to the ED.  The patient was admitted for acute pancreatitis in the setting of previous cholecystectomy and has been started on bowel rest and pain management.  Labs in the ED include SARS-CoV-2 negative, lipase 1,092 (was 785 days ago during previous admission), CBC was stable mild anemia and hemoglobin 12.0 is normocytic and normochromic.  INR normal.  Stool for occult blood pending collection.  This morning his hemoglobin remained stable.  Lipase remains elevated/stable at 1,083.  No new imaging this admission but CT of the abdomen and pelvis as reviewed above.  Right upper quadrant ultrasound  also completed at last admission due to mild bump in AST (57) and alk phos (222) with ultrasound finding a normal exam without acute findings, specifically CBD diameter 3.3 mm.  Today he states he's doing great today. Wants to eat. Angry that he's not allowed to eat. Denies abdominal pain, N/V. States last episode of vomiting yesterday. Did have hematemesis at least with last admission (a few days ago) but not sure if recently or not. Patient is a bit of a poor historian. Denies recent bowel movement. No other GI complaints.  Past Medical History:  Diagnosis Date  . CHF (congestive heart failure) ([place])   . Diabetes mellitus without complication ([place])   . Dialysis patient ([place])   . Peripheral edema   . Renal disorder    kidney disease    Past Surgical History:  Procedure Laterality Date  . AMPUTATION Right 10/21/2019   Procedure: RIGHT BELOW KNEE AMPUTATION;  Surgeon: Newt Minion, MD;  Location: [place];  Service: Orthopedics;  Laterality: Right;  . AV FISTULA PLACEMENT Left 11/13/2019   Procedure: LEFT ARTERIOVENOUS (AV) FISTULA VERSUS ARTERIOVENOUS GRAFT;  Surgeon: Rosetta Posner, MD;  Location: [facility] OR;  Service: Vascular;  Laterality: Left;  . AV FISTULA PLACEMENT Left 04/05/2020   Procedure: INSERTION OF LEFT ARM ARTERIOVENOUS (AV) GORE-TEX GRAFT;  Surgeon: Rosetta Posner, MD;  Location: [place];  Service: Vascular;  Laterality: Left;  . BASCILIC VEIN TRANSPOSITION Left 01/27/2020   Procedure: LEFT ARM 2ND STAGE BASCILIC VEIN TRANSPOSITION;  Surgeon: Rosetta Posner, MD;  Location: [place];  Service: Vascular;  Laterality: Left;  . DIALYSIS/PERMA CATHETER INSERTION N/A 03/03/2020  Procedure: DIALYSIS/PERMA CATHETER INSERTION;  Surgeon: Algernon Huxley, MD;  Location: [place] CV LAB;  Service: Cardiovascular;  Laterality: N/A;  . INSERTION OF DIALYSIS CATHETER N/A 04/05/2020   Procedure: INSERTION OF TUNNEL  DIALYSIS CATHETER LEFT INTERNAL JUGULAR;  Surgeon: Rosetta Posner, MD;  Location: [place];   Service: Vascular;  Laterality: N/A;  . IR FLUORO GUIDE CV LINE RIGHT  11/05/2019  . IR FLUORO GUIDE CV LINE RIGHT  06/07/2020  . IR REMOVAL TUN CV CATH W/O FL  06/01/2020  . IR THROMBECTOMY AV FISTULA W/THROMBOLYSIS/PTA INC/SHUNT/IMG LEFT Left 06/06/2020  . IR US GUIDE VASC ACCESS LEFT  06/06/2020  . IR US GUIDE VASC ACCESS RIGHT  11/05/2019  . IR US GUIDE VASC ACCESS RIGHT  06/07/2020  . REMOVAL OF A DIALYSIS CATHETER Right 04/05/2020   Procedure: REMOVAL OF RIGHT CHEST DIALYSIS CATHETER;  Surgeon: Rosetta Posner, MD;  Location: [place];  Service: Vascular;  Laterality: Right;  . TOE AMPUTATION Bilateral    due to osteomyelitis, all 5 each foot    Prior to Admission medications   Medication Sig Start Date End Date Taking? Authorizing Provider  albuterol (VENTOLIN HFA) 108 (90 Base) MCG/ACT inhaler Inhale 1 puff into the lungs every 4 (four) hours as needed for wheezing or shortness of breath. 08/20/19  Yes Gladys Damme, MD  aspirin EC 81 MG EC tablet Take 1 tablet (81 mg total) by mouth daily. 08/21/19  Yes Gladys Damme, MD  ergocalciferol (VITAMIN D2) 1.25 MG (50000 UT) capsule Take 1 capsule (50,000 Units total) by mouth every Friday. 12/11/19  Yes Samella Parr, NP  furosemide (LASIX) 80 MG tablet Take 80 mg by mouth 2 (two) times daily. 05/21/20  Yes [provider]  insulin glargine (LANTUS) 100 UNIT/ML injection Inject 0.35 mLs (35 Units total) into the skin at bedtime. Patient taking differently: Inject 25 Units into the skin at bedtime. 12/11/19  Yes Samella Parr, NP  Insulin Pen Needle (PEN NEEDLES 31GX5/16") 31G X 8 MM MISC 1 each by Other route daily. 12/11/19 12/10/20 Yes Samella Parr, NP  Multiple Vitamin (MULTIVITAMIN WITH MINERALS) TABS tablet Take 1 tablet by mouth daily.   Yes [provider]  NOVOLOG FLEXPEN 100 UNIT/ML FlexPen Inject 10 Units into the skin 3 (three) times daily. 05/24/20  Yes [provider]  pregabalin (LYRICA) 50 MG capsule  Take 1 capsule (50 mg total) by mouth 2 (two) times daily. 12/11/19  Yes Samella Parr, NP  sevelamer (RENAGEL) 800 MG tablet Take 1,600 mg by mouth 3 (three) times daily with meals.   Yes [provider]  midodrine (PROAMATINE) 10 MG tablet Take 1 tablet (10 mg total) by mouth 3 (three) times daily with meals. Patient not taking: Reported on 06/16/2020 12/11/19   Samella Parr, NP  phenytoin (DILANTIN) 100 MG ER capsule TAKE 3 CAPSULES(300 MG) BY MOUTH AT BEDTIME 06/16/20   Garvin Fila, MD  phenytoin (DILANTIN) 100 MG ER capsule Take 3 capsules (300 mg total) by mouth at bedtime. 06/15/20   Garvin Fila, MD  torsemide (DEMADEX) 100 MG tablet Take 100 mg by mouth daily. On Monday-Wednesday-Friday-Sunday (non-dialysis days) Patient not taking: Reported on 06/16/2020 04/29/20   [provider]    Current Facility-Administered Medications  Medication Dose Route Frequency Provider Last Rate Last Admin  . 0.9 %  sodium chloride infusion   Intravenous Continuous Kathie Dike, MD 50 mL/hr at 06/16/20 2319 New Bag at 06/16/20 2319  .  acetaminophen (TYLENOL) tablet 650 mg  650 mg Oral Q6H PRN Kathie Dike, MD       Or  . acetaminophen (TYLENOL) suppository 650 mg  650 mg Rectal Q6H PRN Kathie Dike, MD      . Chlorhexidine Gluconate Cloth 2 % PADS 6 each  6 each Topical Daily Memon, Jehanzeb, MD      . HYDROmorphone (DILAUDID) injection 0.5 mg  0.5 mg Intravenous Q2H PRN Kathie Dike, MD   0.5 mg at 06/17/20 0640  . insulin aspart (novoLOG) injection 0-9 Units  0-9 Units Subcutaneous Q4H Kathie Dike, MD   2 Units at 06/16/20 2339  . pantoprazole (PROTONIX) injection 40 mg  40 mg Intravenous Q12H Kathie Dike, MD   40 mg at 06/16/20 2331    Allergies as of 06/16/2020 - Review Complete 06/16/2020  Allergen Reaction Noted  . Bee venom Anaphylaxis 06/12/2016  . Propofol Other (See Comments) 04/28/2018  . Morphine Nausea And Vomiting 03/05/2018  . Oxycodone Nausea  And Vomiting 06/18/2019    Family History  Problem Relation Age of Onset  . Cancer Mother        uknown type of cancer  . Colon cancer Neg Hx   . Colon polyps Neg Hx     Social History   Socioeconomic History  . Marital status: Single    Spouse name: Not on file  . Number of children: Not on file  . Years of education: Not on file  . Highest education level: Not on file  Occupational History  . Not on file  Tobacco Use  . Smoking status: Current Every Day Smoker    Packs/day: 0.50    Types: Cigarettes  . Smokeless tobacco: Never Used  . Tobacco comment: smoked this am  Vaping Use  . Vaping Use: Never used  Substance and Sexual Activity  . Alcohol use: Not Currently  . Drug use: Not Currently    Types: Cocaine    Comment: last cocaine use a few months ago  . Sexual activity: Not Currently  Other Topics Concern  . Not on file  Social History Narrative   Lives with friend   Right Handed   Drinks no caffeine   Social Determinants of Health   Financial Resource Strain: Not on file  Food Insecurity: Not on file  Transportation Needs: Not on file  Physical Activity: Not on file  Stress: Not on file  Social Connections: Not on file  Intimate Partner Violence: Not on file    Review of Systems: General: Negative for anorexia, weight loss, fever, chills, fatigue, weakness. ENT: Negative for hoarseness, difficulty swallowing. CV: Negative for chest pain, angina, palpitations, peripheral edema.  Respiratory: Negative for dyspnea at rest, cough, sputum, wheezing.  GI: See history of present illness. Endo: Negative for unusual weight change.  Heme: Negative for bruising or bleeding. Allergy: Negative for rash or hives.  Physical Exam: Vital signs in last 24 hours: Temp:  [97.7 F (36.5 C)-98 F (36.7 C)] 97.7 F (36.5 C) (03/04 0642) Pulse Rate:  [80-87] 82 (03/04 0642) Resp:  [12-20] 20 (03/04 0642) BP: (120-156)/(73-90) 156/90 (03/04 0642) SpO2:  [86 %-98 %]  96 % (03/04 0642) Weight:  [948 kg] 122 kg (03/03 1120) Last BM Date: 06/15/20 General:   Alert,  Well-developed, well-nourished, pleasant and cooperative in NAD Head:  Normocephalic and atraumatic. Eyes:  Sclera clear, no icterus. Conjunctiva pink. Ears:  Normal auditory acuity. Neck:  Supple; no masses or thyromegaly. Lungs:  Clear throughout to  auscultation.   No wheezes, crackles, or rhonchi. No acute distress. Heart:  Regular rate and rhythm; no murmurs, clicks, rubs,  or gallops. Abdomen:  Soft, nontender and nondistended. No masses, hepatosplenomegaly or hernias noted. Normal bowel sounds, without guarding, and without rebound.   Rectal:  Deferred.   Msk:  Symmetrical without gross deformities. Neurologic:  Alert and  oriented x4;  grossly normal neurologically. Psych:  Alert and cooperative. Normal mood and affect.  Intake/Output from previous day: 03/03 0701 - 03/04 0700 In: 730.3 [I.V.:230.3; IV Piggyback:500] Out: -  Intake/Output this shift: No intake/output data recorded.  Lab Results: Recent Labs    06/16/20 1154 06/17/20 0613  WBC 7.6 5.8  HGB 12.0* 12.2*  HCT 39.4 40.4  PLT 277 281   BMET Recent Labs    06/16/20 1154 06/17/20 0613  NA 133* 139  K 4.0 4.1  CL 97* 103  CO2 25 24  GLUCOSE 267* 204*  BUN 24* 24*  CREATININE 3.86* 4.16*  CALCIUM 8.0* 8.3*   LFT Recent Labs    06/16/20 1154  PROT 7.5  ALBUMIN 3.0*  AST 12*  ALT 13  ALKPHOS 152*  BILITOT 0.7   PT/INR Recent Labs    06/16/20 1154  LABPROT 12.8  INR 1.0   Hepatitis Panel No results for input(s): HEPBSAG, HCVAB, HEPAIGM, HEPBIGM in the last 72 hours. C-Diff No results for input(s): CDIFFTOX in the last 72 hours.  Studies/Results: No results found.  Impression: 60 year old male with a history of repeated admissions and leaving AMA again presents to the hospital after recent [facility] discharge on 06/13/2020.  At his last hospitalization he was admitted for acute pancreatitis and  GI consulted for hematemesis/possible melena concerning for possible upper GI bleed.  However, he left AMA before he can complete GI work-up. He states he hasn't had anything to eat in 6 days, even after recently leaving AMA "because I want a grilled cheese sandwich."   Acute uncomplicated pancreatitis: He has since returned to the hospital with persistent symptoms including abdominal pain, nausea, vomiting.  He was admitted for acute pancreatitis, appears uncomplicated and is being adequately managed by hospitalist with bowel rest and pain/nausea management. Denies pain, N/V today. Last emesis yesterday states no blood in it. However, lipase remains quite elevated.  Hematemesis: We were consulted for possibility of upper GI bleed including hematemesis, dark/tarry stools.  During his previous admission we recommended upper endoscopy but this could not be completed before he left AMA due to desiring to eat.  His hemoglobin is mildly low around 12.0, which is stable (likely multifactorial with ESRD). He states he isn't exactly sure when he last had vomiting, did have hematemesis with last hospitalization. Angry he cannot eat, discussed need for NPO status for EGD and due to acute pancreatitis.  Proceed with EGD on propofol/MAC with Dr. Abbey Chatters on propofol/MAC in near future: the risks, benefits, and alternatives have been discussed with the patient in detail. The patient states understanding and desires to proceed.  ASA III  Cleared EGD today with anesthesia with stable hgb, potassium 4.1.  Discussed plan for EGD with HD nurse who states they will plan his HD around the procedure schedule. Tentatively planning to dialyze him last today as EGD likely around 2:00 pm.  Plan: 1. Continue NPO 2. Monitor and document any recurrent hematemesis 3. Planned HD today and tomorrow per nephrology 4. Plan for EGD with Dr. Abbey Chatters later today 5. Pancreatitis management per hospitalist 6. Further recommendations to  follow  Thank you for allowing [place] to participate in the care of Zachary Novak, DNP, AGNP-C Adult & Gerontological Nurse Practitioner [facility]    LOS: 1 day     06/17/2020, 8:02 AM

## 2020-06-17 NOTE — Transfer of Care (Signed)
Immediate Anesthesia Transfer of Care Note  Patient: Zachary Young  Procedure(s) Performed: ESOPHAGOGASTRODUODENOSCOPY (EGD) WITH PROPOFOL (N/A )  Patient Location: PACU  Anesthesia Type:General  Level of Consciousness: awake, alert , oriented and patient cooperative  Airway & Oxygen Therapy: Patient Spontanous Breathing  Post-op Assessment: Report given to RN, Post -op Vital signs reviewed and stable and Patient moving all extremities X 4  Post vital signs: Reviewed and stable  Last Vitals:  Vitals Value Taken Time  BP    Temp    Pulse    Resp    SpO2      Last Pain:  Vitals:   06/17/20 1154  TempSrc:   PainSc: 0-No pain      Patients Stated Pain Goal: 7 ([id number])  Complications: No complications documented.

## 2020-06-17 NOTE — Progress Notes (Signed)
Pt tolerated clear liquids without c/o n/v or pain. Currently sleeping, resp even and non-labored, occasional snoring. Skin warm and dry.

## 2020-06-17 NOTE — Procedures (Signed)
   NEPHROLOGY NURSING NOTE:  Pt has refused HD at present time, even offer of an abbreviated treatment.  Will check back after dialyzing another patient and see if patient is more agreeable then.  Dr. Joelyn Oms and Dr. Roderic Palau were notified.  Rockwell Alexandria, RN

## 2020-06-17 NOTE — Consult Note (Addendum)
Zachary Young Admit Date: 06/16/2020 06/17/2020 Rexene Agent Requesting Physician:  Roderic Palau MD  Reason for Consult:  ESRD comanagement HPI:  46M ESRD THS [facility] recent admit for acute pancreatitis and left [facility] 2/28.  Returned 3/3 with ongoing abdominal pain, increase lipase.  Has had some ? Of melena and GI eval pending.  Did not rec outpt HD 3/3.    This AM wants food, drink.  NO SOB, CP.  K 4.1, Hb 12.2, BUN 24.    Has outpt eval of clotted LUE AVG 3/7 with Dr. Donnetta Hutching.    PMH Incudes:  DM  S/dCHF  HTN  R BKA  HD Order, partial: 3K, 25 Ca 400/500, 4h73mn, cont hep with HD, Revaclr, EDW 124kg, epogen  Balance of 12 systems is negative w/ exceptions as above  PMH  Past Medical History:  Diagnosis Date  . CH[place] (congestive heart failure) ([place]   . [place] mellitus [place]   . [place]   . Peripheral edema   . Renal disorder    kidney disease   PSH  Past Surgical History:  Procedure Laterality Date  . AMPUTATION Right 10/21/2019   Procedure: RIGHT BELOW KNEE AMPUTATION;  Surgeon: DNewt Minion MD;  [place]  Service: Orthopedics;  Laterality: Right;  . AV FISTULA PLACEMENT Left 11/13/2019   Procedure: LEFT ARTERIOVENOUS (AV) FISTULA VERSUS ARTERIOVENOUS GRAFT;  Surgeon: ERosetta Posner MD;  [facility];  Service: Vascular;  Laterality: Left;  . AV FISTULA PLACEMENT Left 04/05/2020   Procedure: INSERTION OF LEFT ARM ARTERIOVENOUS (AV) GORE-TEX GRAFT;  Surgeon: ERosetta Posner MD;  [place]  Service: Vascular;  Laterality: Left;  . BASCILIC VEIN TRANSPOSITION Left 01/27/2020   Procedure: LEFT ARM 2ND STAGE BASCILIC VEIN TRANSPOSITION;  Surgeon: ERosetta Posner MD;  [place]  Service: Vascular;  Laterality: Left;  . DIALYSIS/PERMA CATHETER INSERTION N/A 03/03/2020   Procedure: DIALYSIS/PERMA CATHETER INSERTION;  Surgeon: DAlgernon Huxley MD;  [place] [facility];  Service: Cardiovascular;   Laterality: N/A;  . INSERTION OF DIALYSIS CATHETER N/A 04/05/2020   Procedure: INSERTION OF TUNNEL  DIALYSIS CATHETER LEFT INTERNAL JUGULAR;  Surgeon: ERosetta Posner MD;  [place]  Service: Vascular;  Laterality: N/A;  . IR FLUORO GUIDE CV LINE RIGHT  11/05/2019  . IR FLUORO GUIDE CV LINE RIGHT  06/07/2020  . IR REMOVAL TUN CV CATH W/O FL  06/01/2020  . IR THROMBECTOMY AV FISTULA W/THROMBOLYSIS/PTA INC/SHUNT/IMG LEFT Left 06/06/2020  . IR [place] ACCESS LEFT  06/06/2020  . IR [place] ACCESS RIGHT  11/05/2019  . IR [place] V[place] ACCESS RIGHT  06/07/2020  . REMOVAL OF A DIALYSIS CATHETER Right 04/05/2020   Procedure: REMOVAL OF RIGHT CHEST DIALYSIS CATHETER;  Surgeon: ERosetta Posner MD;  [place]  Service: Vascular;  Laterality: Right;  . TOE AMPUTATION Bilateral    due to osteomyelitis, all 5 each foot   FH  Family History  Problem Relation Age of Onset  . Cancer Mother        uknown type of cancer  . Colon cancer Neg Hx   . Colon polyps Neg Hx    SH  reports that he has been smoking cigarettes. He has been smoking about 0.50 packs per day. He has never used smokeless tobacco. He reports previous alcohol use. He reports previous drug use. Drug: Cocaine. Allergies  Allergies  Allergen Reactions  .  Bee Venom Anaphylaxis    Per pt, nearly died from bee sting as a child  . Propofol Other (See Comments)     Hallucinations  . Morphine Nausea And Vomiting  . Oxycodone Nausea And Vomiting   Home medications Prior to Admission medications   Medication Sig Start Date End Date Taking? Authorizing Provider  albuterol (VENTOLIN HFA) 108 (90 Base) MCG/ACT inhaler Inhale 1 puff into the lungs every 4 (four) hours as needed for wheezing or shortness of breath. 08/20/19  Yes Gladys Damme, MD  aspirin EC 81 MG EC tablet Take 1 tablet (81 mg total) by mouth daily. 08/21/19  Yes Gladys Damme, MD  ergocalciferol (VITAMIN D2) 1.25 MG (50000 UT) capsule Take 1 capsule (50,000  Units total) by mouth every Friday. 12/11/19  Yes Samella Parr, NP  furosemide (LASIX) 80 MG tablet Take 80 mg by mouth 2 (two) times daily. 05/21/20  Yes [provider]  insulin glargine (LANTUS) 100 UNIT/ML injection Inject 0.35 mLs (35 Units total) into the skin at bedtime. Patient taking differently: Inject 25 Units into the skin at bedtime. 12/11/19  Yes Samella Parr, NP  Insulin Pen Needle (PEN NEEDLES 31GX5/16") 31G X 8 MM MISC 1 each by Other route daily. 12/11/19 12/10/20 Yes Samella Parr, NP  Multiple Vitamin (MULTIVITAMIN WITH MINERALS) TABS tablet Take 1 tablet by mouth daily.   Yes [provider]  NOVOLOG FLEXPEN 100 UNIT/ML FlexPen Inject 10 Units into the skin 3 (three) times daily. 05/24/20  Yes [provider]  pregabalin (LYRICA) 50 MG capsule Take 1 capsule (50 mg total) by mouth 2 (two) times daily. 12/11/19  Yes Samella Parr, NP  sevelamer (RENAGEL) 800 MG tablet Take 1,600 mg by mouth 3 (three) times daily with meals.   Yes [provider]  midodrine (PROAMATINE) 10 MG tablet Take 1 tablet (10 mg total) by mouth 3 (three) times daily with meals. Patient not taking: Reported on 06/16/2020 12/11/19   Samella Parr, NP  phenytoin (DILANTIN) 100 MG ER capsule TAKE 3 CAPSULES(300 MG) BY MOUTH AT BEDTIME 06/16/20   Garvin Fila, MD  phenytoin (DILANTIN) 100 MG ER capsule Take 3 capsules (300 mg total) by mouth at bedtime. 06/15/20   Garvin Fila, MD  torsemide (DEMADEX) 100 MG tablet Take 100 mg by mouth daily. On Monday-Wednesday-Friday-Sunday (non-dialysis days) Patient not taking: Reported on 06/16/2020 04/29/20   [provider]    Current Medications Scheduled Meds: . Chlorhexidine Gluconate Cloth  6 each Topical Daily  . insulin aspart  0-9 Units Subcutaneous Q4H  . pantoprazole (PROTONIX) IV  40 mg Intravenous Q12H   Continuous Infusions: . sodium chloride 50 mL/hr at 06/16/20 2319   PRN Meds:.acetaminophen **OR**  acetaminophen, HYDROmorphone (DILAUDID) injection  CBC Recent Labs  Lab 06/12/20 2015 06/13/20 0640 06/16/20 1154 06/17/20 0613  WBC 7.9 6.9 7.6 5.8  NEUTROABS 5.5  --  5.2  --   HGB 12.3* 12.5* 12.0* 12.2*  HCT 39.9 42.1 39.4 40.4  MCV 84.9 86.8 86.0 86.9  PLT 320 290 277 [id number]   Basic Metabolic Panel Recent Labs  Lab 06/12/20 2015 06/13/20 0639 06/13/20 0640 06/16/20 1154 06/17/20 0613  NA 127*  --  135 133* 139  K 4.1  --  4.3 4.0 4.1  CL 96*  --  96* 97* 103  CO2 26  --  '26 25 24  '$ GLUCOSE 419*  --  409* 267* 204*  BUN 29*  --  31* 24* 24*  CREATININE 4.00*  --  3.84* 3.86* 4.16*  CALCIUM 8.4*  --  9.2 8.0* 8.3*  PHOS  --  5.5*  --   --   --     Physical Exam  Blood pressure (!) 156/90, pulse 82, temperature 97.7 F (36.5 C), temperature source Oral, resp. rate 20, height '6\' 3"'$  (1.905 m), weight 122 kg, SpO2 96 %. GEN: NAD, chronically ill appearing ENT: NCAT EYES: EOMI CV: RRR PULM: CTAB ABD: s/nt/nd SKIN: no rashes/lesions EXT:no LEE, R BKA R IJ TDC loosely bandaged  Assessment 57M ESRD [facility] THS RIJ TDC with acute pancreatitis  1. ESRD THS [facility]   1. HD today off schedule, then tomorrow to get back on schedule 2. 4h 2K, Tight heparin, 2-3L UF, TDC 3. Plan for 3h treatment tomorrow to get back on schedule  2. Acute pancreatitis / Ab pain: per TRH/GI 3. Anemia Hb stable 4. CKD-BMD: NPO, resume binders when has diet 5. Nonfunctiona / Clotted AVG LUE: Has VVS appt with Early 3/7, might req rescheduling 6. S/d CHF, chornic, compensated  Plan 1. As above 2. Will not see in person over the weekend, dialysis is arranged.  Please call with any questions or concerns. 3. Daily weights, Daily Renal Panel, Strict I/Os, Avoid nephrotoxins (NSAIDs, judicious IV Contrast)    Rexene Agent  06/17/2020, 8:33 AM

## 2020-06-17 NOTE — Progress Notes (Signed)
Pt provided with wheelchair to use in room for transfer to bathroom. Pt able to demonstrate appropriate safety measures with locking chair, transfer, etc. Pt to bathroom for BM. When I returned to room, pt back in bed. Strong smell of cigarette smoke noted in bathroom and room in general. Asked pt if he was smoking in bathroom, pt states, "Naw, I just sprayed some incense to cover the poop smell." No cigarette or other smoking paraphernalia noted in room. Advised pt that smoking is not allowed in room or bathroom and that we can get a nicotene patch ordered if he feels he needs it. Pt states, "OK." Pt advised that endo staff are on their way to get him for scheduled procedure. CHG bath wipes completed.

## 2020-06-17 NOTE — Progress Notes (Signed)
EGD complete with only abnormality of gastritis. 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 Patient was pain free this morning, no further N/V. Can start early refeeding (clear liquids vs. Low fat diet). Discussed with Dr. Roderic Palau.  His course of acute pancreatitis is uncomplicated. No obvious etiology (denies drinking, no classic drug offenders, no biliary abnormality on imaging, previous lipid panel normal in 2021). Currently "idiopathic" with some recurrence pattern.  Possibilities for culpability could include covert ETOH, sphincter of oddi dysfunction; per research, some possible increased risk of AP in ESRD patients on HD. Cannot rule out subtle malignancy. Less likely alpha-1-antitripsyn deficiency given no pulmonary manifestations.  Overall the patient will benefit from outpatient evaluation for acute recurrent pancreatitis. We will schedule outpatient follow-up.  We will sign off for now, will remove consult order. Please contact us and enter a new consult order if we can be of further assistance.  Thank you for allowing [place] to participate in the care of Zachary Novak, DNP, AGNP-C Adult & Gerontological Nurse Practitioner [facility]

## 2020-06-17 NOTE — Anesthesia Postprocedure Evaluation (Signed)
Anesthesia Post Note  Patient: Zachary Young  Procedure(s) Performed: ESOPHAGOGASTRODUODENOSCOPY (EGD) WITH PROPOFOL (N/A )  Patient location during evaluation: Phase II Anesthesia Type: General Level of consciousness: awake, oriented, awake and alert and patient cooperative Pain management: satisfactory to patient Vital Signs Assessment: post-procedure vital signs reviewed and stable Respiratory status: spontaneous breathing, respiratory function stable and nonlabored ventilation Cardiovascular status: stable Postop Assessment: no apparent nausea or vomiting Anesthetic complications: no   No complications documented.   Last Vitals:  Vitals:   06/17/20 0642 06/17/20 1119  BP: (!) 156/90 (!) 172/102  Pulse: 82 82  Resp: 20 11  Temp: 36.5 C 36.7 C  SpO2: 96% 99%    Last Pain:  Vitals:   06/17/20 1154  TempSrc:   PainSc: 0-No pain                 Karna Dupes

## 2020-06-17 NOTE — Progress Notes (Signed)
Pt requesting to leave now. Pt states he is signing out against medical advice. MD Memon and charge nurse Gean Maidens, RN notified. Pt signed AMA form after being advised of risks of leaving without completing treatment. States will have his regular dialysis tx tomorrow and will go to [facility] if he feels worse or starts vomiting again. IV removed, pt currently putting on his clothing, waiting for his ride to get here.

## 2020-06-17 NOTE — Telephone Encounter (Signed)
We have signed off patient hospitalization. Anticipate d/c within 48 hours. Please schedule 4 week outpatient follow-up with Manuella Ghazi, or myself. Thanks!

## 2020-06-20 ENCOUNTER — Ambulatory Visit: Payer: Medicaid Other | Admitting: Vascular Surgery

## 2020-06-21 ENCOUNTER — Encounter (HOSPITAL_COMMUNITY): Payer: Medicaid Other

## 2020-06-21 ENCOUNTER — Other Ambulatory Visit (HOSPITAL_COMMUNITY): Payer: Medicaid Other

## 2020-06-22 ENCOUNTER — Ambulatory Visit (HOSPITAL_COMMUNITY)
Admission: RE | Admit: 2020-06-22 | Discharge: 2020-06-22 | Disposition: A | Discharge status: Home / Self Care | Payer: Medicaid Other | Source: Ambulatory Visit | Attending: Neurology | Admitting: Neurology

## 2020-06-22 ENCOUNTER — Encounter (HOSPITAL_COMMUNITY): Payer: Self-pay | Admitting: Internal Medicine

## 2020-06-22 ENCOUNTER — Ambulatory Visit (HOSPITAL_BASED_OUTPATIENT_CLINIC_OR_DEPARTMENT_OTHER)
Admission: RE | Admit: 2020-06-22 | Discharge: 2020-06-22 | Disposition: A | Discharge status: Home / Self Care | Payer: Medicaid Other | Source: Ambulatory Visit | Attending: Neurology | Admitting: Neurology

## 2020-06-22 ENCOUNTER — Other Ambulatory Visit: Payer: Self-pay

## 2020-06-22 DIAGNOSIS — R55 Syncope and collapse: Secondary | ICD-10-CM

## 2020-06-22 NOTE — Progress Notes (Signed)
Carotid duplex bilateral and TCD study completed.   Please see CV Proc for preliminary results.   Imri Lor, RDMS, RVT  

## 2020-06-23 ENCOUNTER — Inpatient Hospital Stay (HOSPITAL_COMMUNITY)
Admission: EM | Admit: 2020-06-23 | Discharge: 2020-06-28 | DRG: 438 | Disposition: A | Discharge status: Home / Self Care | Payer: Medicaid Other | Attending: Internal Medicine | Admitting: Internal Medicine

## 2020-06-23 ENCOUNTER — Emergency Department (HOSPITAL_COMMUNITY): Payer: Medicaid Other

## 2020-06-23 ENCOUNTER — Other Ambulatory Visit: Payer: Self-pay

## 2020-06-23 ENCOUNTER — Encounter (HOSPITAL_COMMUNITY): Payer: Self-pay | Admitting: Emergency Medicine

## 2020-06-23 DIAGNOSIS — E785 Hyperlipidemia, unspecified: Secondary | ICD-10-CM | POA: Diagnosis present

## 2020-06-23 DIAGNOSIS — Z91030 Bee allergy status: Secondary | ICD-10-CM

## 2020-06-23 DIAGNOSIS — N186 End stage renal disease: Secondary | ICD-10-CM | POA: Diagnosis not present

## 2020-06-23 DIAGNOSIS — Z794 Long term (current) use of insulin: Secondary | ICD-10-CM

## 2020-06-23 DIAGNOSIS — I2510 Atherosclerotic heart disease of native coronary artery without angina pectoris: Secondary | ICD-10-CM | POA: Diagnosis present

## 2020-06-23 DIAGNOSIS — E1165 Type 2 diabetes mellitus with hyperglycemia: Secondary | ICD-10-CM

## 2020-06-23 DIAGNOSIS — K8500 Idiopathic acute pancreatitis without necrosis or infection: Principal | ICD-10-CM | POA: Diagnosis present

## 2020-06-23 DIAGNOSIS — Z7982 Long term (current) use of aspirin: Secondary | ICD-10-CM

## 2020-06-23 DIAGNOSIS — K859 Acute pancreatitis without necrosis or infection, unspecified: Secondary | ICD-10-CM | POA: Diagnosis not present

## 2020-06-23 DIAGNOSIS — E1122 Type 2 diabetes mellitus with diabetic chronic kidney disease: Secondary | ICD-10-CM | POA: Diagnosis present

## 2020-06-23 DIAGNOSIS — E782 Mixed hyperlipidemia: Secondary | ICD-10-CM | POA: Diagnosis present

## 2020-06-23 DIAGNOSIS — I5042 Chronic combined systolic (congestive) and diastolic (congestive) heart failure: Secondary | ICD-10-CM | POA: Diagnosis present

## 2020-06-23 DIAGNOSIS — E11319 Type 2 diabetes mellitus with unspecified diabetic retinopathy without macular edema: Secondary | ICD-10-CM | POA: Diagnosis present

## 2020-06-23 DIAGNOSIS — F17210 Nicotine dependence, cigarettes, uncomplicated: Secondary | ICD-10-CM | POA: Diagnosis present

## 2020-06-23 DIAGNOSIS — Z20822 Contact with and (suspected) exposure to covid-19: Secondary | ICD-10-CM | POA: Diagnosis present

## 2020-06-23 DIAGNOSIS — I110 Hypertensive heart disease with heart failure: Secondary | ICD-10-CM | POA: Diagnosis present

## 2020-06-23 DIAGNOSIS — Z992 Dependence on renal dialysis: Secondary | ICD-10-CM

## 2020-06-23 DIAGNOSIS — D631 Anemia in chronic kidney disease: Secondary | ICD-10-CM | POA: Diagnosis present

## 2020-06-23 DIAGNOSIS — Z885 Allergy status to narcotic agent status: Secondary | ICD-10-CM

## 2020-06-23 DIAGNOSIS — E1159 Type 2 diabetes mellitus with other circulatory complications: Secondary | ICD-10-CM | POA: Diagnosis not present

## 2020-06-23 DIAGNOSIS — Z89511 Acquired absence of right leg below knee: Secondary | ICD-10-CM

## 2020-06-23 DIAGNOSIS — E8889 Other specified metabolic disorders: Secondary | ICD-10-CM | POA: Diagnosis present

## 2020-06-23 DIAGNOSIS — I132 Hypertensive heart and chronic kidney disease with heart failure and with stage 5 chronic kidney disease, or end stage renal disease: Secondary | ICD-10-CM | POA: Diagnosis present

## 2020-06-23 DIAGNOSIS — Z888 Allergy status to other drugs, medicaments and biological substances status: Secondary | ICD-10-CM

## 2020-06-23 DIAGNOSIS — Z79899 Other long term (current) drug therapy: Secondary | ICD-10-CM

## 2020-06-23 DIAGNOSIS — I10 Essential (primary) hypertension: Secondary | ICD-10-CM | POA: Diagnosis present

## 2020-06-23 DIAGNOSIS — J479 Bronchiectasis, uncomplicated: Secondary | ICD-10-CM | POA: Diagnosis present

## 2020-06-23 LAB — URINALYSIS, ROUTINE W REFLEX MICROSCOPIC
Bilirubin Urine: NEGATIVE
Glucose, UA: >=500 mg/dL — AB
Ketones, ur: NEGATIVE mg/dL
Leukocytes,Ua: NEGATIVE
Nitrite: NEGATIVE
Protein, ur: >=300 mg/dL — AB
Specific Gravity, Urine: 1.015 (ref 1.005–1.030)
pH: 7 (ref 5.0–8.0)

## 2020-06-23 LAB — CBC
HCT: 40 % (ref 39.0–52.0)
Hemoglobin: 12.2 g/dL — ABNORMAL LOW (ref 13.0–17.0)
MCH: 26.3 pg (ref 26.0–34.0)
MCHC: 30.5 g/dL (ref 30.0–36.0)
MCV: 86.4 fL (ref 80.0–100.0)
Platelets: 292 10*3/uL (ref 150–400)
RBC: 4.63 MIL/uL (ref 4.22–5.81)
RDW: 18.3 % — ABNORMAL HIGH (ref 11.5–15.5)
WBC: 6.2 10*3/uL (ref 4.0–10.5)
nRBC: 0 % (ref 0.0–0.2)

## 2020-06-23 LAB — COMPREHENSIVE METABOLIC PANEL
ALT: 9 U/L (ref 0–44)
AST: 11 U/L — ABNORMAL LOW (ref 15–41)
Albumin: 2.9 g/dL — ABNORMAL LOW (ref 3.5–5.0)
Alkaline Phosphatase: 165 U/L — ABNORMAL HIGH (ref 38–126)
Anion gap: 10 (ref 5–15)
BUN: 34 mg/dL — ABNORMAL HIGH (ref 6–20)
CO2: 24 mmol/L (ref 22–32)
Calcium: 9 mg/dL (ref 8.9–10.3)
Chloride: 100 mmol/L (ref 98–111)
Creatinine, Ser: 3.88 mg/dL — ABNORMAL HIGH (ref 0.61–1.24)
GFR, Estimated: 17 mL/min — ABNORMAL LOW (ref >60)
Glucose, Bld: 304 mg/dL — ABNORMAL HIGH (ref 70–99)
Potassium: 3.8 mmol/L (ref 3.5–5.1)
Sodium: 134 mmol/L — ABNORMAL LOW (ref 135–145)
Total Bilirubin: 0.4 mg/dL (ref 0.3–1.2)
Total Protein: 7.2 g/dL (ref 6.5–8.1)

## 2020-06-23 LAB — LIPASE, BLOOD: Lipase: 210 U/L — ABNORMAL HIGH (ref 11–51)

## 2020-06-23 LAB — RESP PANEL BY RT-PCR (FLU A&B, COVID) ARPGX2
Influenza A by PCR: NEGATIVE
Influenza B by PCR: NEGATIVE
SARS Coronavirus 2 by RT PCR: NEGATIVE

## 2020-06-23 LAB — BRAIN NATRIURETIC PEPTIDE: B Natriuretic Peptide: 385 pg/mL — ABNORMAL HIGH (ref 0.0–100.0)

## 2020-06-23 LAB — GLUCOSE, CAPILLARY
Glucose-Capillary: 187 mg/dL — ABNORMAL HIGH (ref 70–99)
Glucose-Capillary: 212 mg/dL — ABNORMAL HIGH (ref 70–99)

## 2020-06-23 LAB — MAGNESIUM: Magnesium: 1.9 mg/dL (ref 1.7–2.4)

## 2020-06-23 MED ORDER — HEPARIN SODIUM (PORCINE) 5000 UNIT/ML IJ SOLN
5000.0000 [IU] | Freq: Three times a day (TID) | INTRAMUSCULAR | Status: DC
  Administered 2020-06-23 – 2020-06-24 (×3): 5000 [IU] via SUBCUTANEOUS
  Filled 2020-06-23 (×8): qty 1

## 2020-06-23 MED ORDER — HYDROMORPHONE HCL 1 MG/ML IJ SOLN
1.0000 mg | INTRAMUSCULAR | Status: DC | PRN
  Administered 2020-06-23 – 2020-06-28 (×21): 1 mg via INTRAVENOUS
  Filled 2020-06-23 (×23): qty 1

## 2020-06-23 MED ORDER — LIDOCAINE VISCOUS HCL 2 % MT SOLN
15.0000 mL | Freq: Once | OROMUCOSAL | Status: AC
  Administered 2020-06-23: 15 mL via ORAL
  Filled 2020-06-23: qty 15

## 2020-06-23 MED ORDER — PANTOPRAZOLE SODIUM 40 MG IV SOLR
40.0000 mg | Freq: Once | INTRAVENOUS | Status: AC
  Administered 2020-06-23: 40 mg via INTRAVENOUS
  Filled 2020-06-23: qty 40

## 2020-06-23 MED ORDER — ACETAMINOPHEN 650 MG RE SUPP: 650.0000 mg | Freq: Four times a day (QID) | RECTAL | Status: DC | PRN

## 2020-06-23 MED ORDER — ONDANSETRON HCL 4 MG/2ML IJ SOLN
4.0000 mg | Freq: Once | INTRAMUSCULAR | Status: AC
  Administered 2020-06-23: 4 mg via INTRAVENOUS
  Filled 2020-06-23: qty 2

## 2020-06-23 MED ORDER — INSULIN ASPART 100 UNIT/ML ~~LOC~~ SOLN
0.0000 [IU] | Freq: Four times a day (QID) | SUBCUTANEOUS | Status: DC
  Administered 2020-06-23: 5 [IU] via SUBCUTANEOUS

## 2020-06-23 MED ORDER — ACETAMINOPHEN 325 MG PO TABS: 650.0000 mg | ORAL_TABLET | Freq: Four times a day (QID) | ORAL | Status: DC | PRN

## 2020-06-23 MED ORDER — PROMETHAZINE HCL 12.5 MG PO TABS
12.5000 mg | ORAL_TABLET | Freq: Four times a day (QID) | ORAL | Status: DC | PRN
  Administered 2020-06-27 – 2020-06-28 (×2): 12.5 mg via ORAL
  Filled 2020-06-23 (×3): qty 1

## 2020-06-23 MED ORDER — HYDROMORPHONE HCL 1 MG/ML IJ SOLN
1.0000 mg | Freq: Once | INTRAMUSCULAR | Status: AC
  Administered 2020-06-23: 1 mg via INTRAVENOUS
  Filled 2020-06-23: qty 1

## 2020-06-23 MED ORDER — LACTATED RINGERS IV SOLN: INTRAVENOUS | Status: AC

## 2020-06-23 MED ORDER — HYDROMORPHONE HCL 1 MG/ML IJ SOLN
1.0000 mg | Freq: Once | INTRAMUSCULAR | Status: AC
Start: 2020-06-23 — End: 2020-06-23
  Administered 2020-06-23: 1 mg via INTRAVENOUS
  Filled 2020-06-23: qty 1

## 2020-06-23 MED ORDER — PANTOPRAZOLE SODIUM 40 MG IV SOLR
40.0000 mg | INTRAVENOUS | Status: DC
  Administered 2020-06-24 – 2020-06-27 (×4): 40 mg via INTRAVENOUS
  Filled 2020-06-23 (×4): qty 40

## 2020-06-23 MED ORDER — ALUM & MAG HYDROXIDE-SIMETH 200-200-20 MG/5ML PO SUSP
30.0000 mL | Freq: Once | ORAL | Status: AC
  Administered 2020-06-23: 30 mL via ORAL
  Filled 2020-06-23: qty 30

## 2020-06-23 NOTE — Progress Notes (Signed)
Kindly inform the patient that carotid ultrasound study showed no significant narrowing of either carotid arteries in the neck.

## 2020-06-23 NOTE — Progress Notes (Signed)
Patient refusing to wear telemetry monitor.  Explained importance to patient of wearing monitor, and that it was a safety precaution, and it was ordered by his admitting doctor.  Patient states he doesn't want to wear, he is not admitted for chest pain, just stomach pain, and it makes him itch. Patient is very stubborn and belligerent and argumentative with staff.  Monitor removed from patient bedside.  Notified MD of patient refusal to wear monitor.

## 2020-06-23 NOTE — Progress Notes (Signed)
Kindly inform the patient that blood flow velocities in majority of the blood vessels was normal.  The slightly higher in one of the blood vessels on the right side towards the temple but this is not a worrisome finding.

## 2020-06-23 NOTE — H&P (Addendum)
History and Physical    Zachary Young [id number] DOB: Jun 13, 1960 DOA: 06/23/2020  PCP: Mellissa Kohut, DO   Patient coming from: Home  I have personally briefly reviewed patient's old medical records in [place]  Chief Complaint: Abdominal pain  HPI: Zachary Young is a 60 y.o. male with medical history significant for ESRD, hypertension, congestive heart failure, diabetes, cocaine use, right BKA. Patient presented to the ED with complaints of upper abdominal pain that started about 4 AM this morning.  Describes abdominal pain as sharp and stabbing.  Reports multiple episodes of vomiting today.  Denies diarrhea.  Reports similar abdominal pain when he had his episodes of acute pancreatitis recently.  He denies significant alcohol intake, and has not drunk any alcohol beverage in at least 9 months. Patient was scheduled to get dialyzed today, but this did not happen due to his abdominal pain.   Patient also reports swelling involving his extremities.  He is unable to tell me his dry weight.  Reports compliance with torsemide and Lasix.  He still makes a good amount of urine.  Recent hospitalizations 3/2-3/4 and 2/27-2/28 both for acute pancreatitis, both times patient left AMA.   ED Course: Stable vitals.  Stable electrolytes.  Blood glucose 304.  WBC 6.2.  Magnesium 1.9.  BNP elevated 385.  Abdominal CT without contrast shows stable subtle progressive edema likely secondary to developing pancreatitis.  No pancreatic fluid or mass.  Lipase was 210.  She shows diffuse interstitial lung opacities suggesting edema. 1 mg Dilaudid, Protonix and Zofran given.  Hospitalist to admit.  Review of Systems: As per HPI all other systems reviewed and negative.  Past Medical History:  Diagnosis Date  . CHF (congestive heart failure) ([place])   . Diabetes mellitus without complication ([place])   . Dialysis patient ([place])   . Peripheral edema   . Renal disorder    kidney disease    Past  Surgical History:  Procedure Laterality Date  . AMPUTATION Right 10/21/2019   Procedure: RIGHT BELOW KNEE AMPUTATION;  Surgeon: Newt Minion, MD;  Location: [facility];  Service: Orthopedics;  Laterality: Right;  . AV FISTULA PLACEMENT Left 11/13/2019   Procedure: LEFT ARTERIOVENOUS (AV) FISTULA VERSUS ARTERIOVENOUS GRAFT;  Surgeon: Rosetta Posner, MD;  Location: [facility] OR;  Service: Vascular;  Laterality: Left;  . AV FISTULA PLACEMENT Left 04/05/2020   Procedure: INSERTION OF LEFT ARM ARTERIOVENOUS (AV) GORE-TEX GRAFT;  Surgeon: Rosetta Posner, MD;  Location: [facility];  Service: Vascular;  Laterality: Left;  . BASCILIC VEIN TRANSPOSITION Left 01/27/2020   Procedure: LEFT ARM 2ND STAGE BASCILIC VEIN TRANSPOSITION;  Surgeon: Rosetta Posner, MD;  Location: [facility];  Service: Vascular;  Laterality: Left;  . DIALYSIS/PERMA CATHETER INSERTION N/A 03/03/2020   Procedure: DIALYSIS/PERMA CATHETER INSERTION;  Surgeon: Algernon Huxley, MD;  Location: [facility];  Service: Cardiovascular;  Laterality: N/A;  . ESOPHAGOGASTRODUODENOSCOPY (EGD) WITH PROPOFOL N/A 06/17/2020   Procedure: ESOPHAGOGASTRODUODENOSCOPY (EGD) WITH PROPOFOL;  Surgeon: Eloise Harman, DO;  [facility];  Service: Endoscopy;  Laterality: N/A;  . INSERTION OF DIALYSIS CATHETER N/A 04/05/2020   Procedure: INSERTION OF TUNNEL  DIALYSIS CATHETER LEFT INTERNAL JUGULAR;  Surgeon: Rosetta Posner, MD;  Location: [facility];  Service: Vascular;  Laterality: N/A;  . IR FLUORO GUIDE CV LINE RIGHT  11/05/2019  . IR FLUORO GUIDE CV LINE RIGHT  06/07/2020  . IR REMOVAL TUN CV CATH W/O FL  06/01/2020  . IR THROMBECTOMY AV  FISTULA W/THROMBOLYSIS/PTA INC/SHUNT/IMG LEFT Left 06/06/2020  . IR US GUIDE VASC ACCESS LEFT  06/06/2020  . IR US GUIDE VASC ACCESS RIGHT  11/05/2019  . IR US GUIDE VASC ACCESS RIGHT  06/07/2020  . REMOVAL OF A DIALYSIS CATHETER Right 04/05/2020   Procedure: REMOVAL OF RIGHT CHEST DIALYSIS CATHETER;  Surgeon: Rosetta Posner, MD;  Location: [facility];  Service: Vascular;  Laterality: Right;  . TOE AMPUTATION Bilateral    due to osteomyelitis, all 5 each foot     reports that he has been smoking cigarettes. He has been smoking about 0.50 packs per day. He has never used smokeless tobacco. He reports previous alcohol use. He reports previous drug use. Drug: Cocaine.  Allergies  Allergen Reactions  . Bee Venom Anaphylaxis    Per pt, nearly died from bee sting as a child  . Propofol Other (See Comments)     Hallucinations  . Morphine Nausea And Vomiting  . Oxycodone Nausea And Vomiting    Family History  Problem Relation Age of Onset  . Cancer Mother        uknown type of cancer  . Colon cancer Neg Hx   . Colon polyps Neg Hx     Prior to Admission medications   Medication Sig Start Date End Date Taking? Authorizing Provider  aspirin EC 81 MG EC tablet Take 1 tablet (81 mg total) by mouth daily. 08/21/19  Yes Gladys Damme, MD  ergocalciferol (VITAMIN D2) 1.25 MG (50000 UT) capsule Take 1 capsule (50,000 Units total) by mouth every Friday. 12/11/19  Yes Samella Parr, NP  furosemide (LASIX) 80 MG tablet Take 80 mg by mouth 2 (two) times daily. 05/21/20  Yes [provider]  insulin glargine (LANTUS) 100 UNIT/ML injection Inject 0.35 mLs (35 Units total) into the skin at bedtime. Patient taking differently: Inject 25 Units into the skin at bedtime. 12/11/19  Yes Samella Parr, NP  midodrine (PROAMATINE) 10 MG tablet Take 1 tablet (10 mg total) by mouth 3 (three) times daily with meals. 12/11/19  Yes Samella Parr, NP  Multiple Vitamin (MULTIVITAMIN WITH MINERALS) TABS tablet Take 1 tablet by mouth daily.   Yes [provider]  NOVOLOG FLEXPEN 100 UNIT/ML FlexPen Inject 10 Units into the skin 3 (three) times daily. 05/24/20  Yes [provider]  phenytoin (DILANTIN) 100 MG ER capsule Take 3 capsules (300 mg total) by mouth at bedtime. 06/15/20  Yes Garvin Fila, MD  pregabalin (LYRICA) 50 MG capsule  Take 1 capsule (50 mg total) by mouth 2 (two) times daily. 12/11/19  Yes Samella Parr, NP  sevelamer (RENAGEL) 800 MG tablet Take 1,600 mg by mouth 3 (three) times daily with meals.   Yes [provider]  albuterol (VENTOLIN HFA) 108 (90 Base) MCG/ACT inhaler Inhale 1 puff into the lungs every 4 (four) hours as needed for wheezing or shortness of breath. Patient not taking: Reported on 06/23/2020 08/20/19   Gladys Damme, MD  Insulin Pen Needle (PEN NEEDLES 31GX5/16") 31G X 8 MM MISC 1 each by Other route daily. 12/11/19 12/10/20  Samella Parr, NP  phenytoin (DILANTIN) 100 MG ER capsule TAKE 3 CAPSULES(300 MG) BY MOUTH AT BEDTIME 06/16/20   Garvin Fila, MD  torsemide (DEMADEX) 100 MG tablet Take 100 mg by mouth daily. On Monday-Wednesday-Friday-Sunday (non-dialysis days) Patient not taking: Reported on 06/23/2020 04/29/20   [provider]    Physical Exam: Vitals:   06/23/20 1436 06/23/20  1515 06/23/20 1600 06/23/20 1630  BP: (!) 147/83  (!) 142/74 (!) 154/84  Pulse: 74 74 73   Resp: 19 11    Temp:      TempSrc:      SpO2: 95% 98% 90%   Weight:      Height:        Constitutional: In mild to moderate painful distress Vitals:   06/23/20 1436 06/23/20 1515 06/23/20 1600 06/23/20 1630  BP: (!) 147/83  (!) 142/74 (!) 154/84  Pulse: 74 74 73   Resp: 19 11    Temp:      TempSrc:      SpO2: 95% 98% 90%   Weight:      Height:       Eyes: PERRL, lids and conjunctivae normal ENMT: Mucous membranes are  dry  Neck: normal, supple, no masses, no thyromegaly Respiratory: clear to auscultation bilaterally, no wheezing, no crackles. Normal respiratory effort. No accessory muscle use.  Cardiovascular: Regular rate and rhythm, no murmurs / rubs / gallops. Trace swelling left lower extremity. 2+ pedal pulses. No carotid bruits.  Abdomen: Moderate to severe epigastric tenderness, with guarding, no masses palpated. No hepatosplenomegaly..  Musculoskeletal: Right BKA, left  ray amputation, no clubbing / cyanosis. Good ROM, no contractures.   Skin: no rashes, lesions, ulcers. No induration Neurologic: No apparent cranial nerve abnormality, moving extremities spontaneously. Psychiatric: Normal judgment and insight. Alert and oriented x 3. Normal mood.   Labs on Admission: I have personally reviewed following labs and imaging studies  CBC: Recent Labs  Lab 06/17/20 0613 06/23/20 1200  WBC 5.8 6.2  HGB 12.2* 12.2*  HCT 40.4 40.0  MCV 86.9 86.4  PLT 281 [id number]   Basic Metabolic Panel: Recent Labs  Lab 06/17/20 0613 06/23/20 1200  NA 139 134*  K 4.1 3.8  CL 103 100  CO2 24 24  GLUCOSE 204* 304*  BUN 24* 34*  CREATININE 4.16* 3.88*  CALCIUM 8.3* 9.0  MG  --  1.9   Liver Function Tests: Recent Labs  Lab 06/23/20 1200  AST 11*  ALT 9  ALKPHOS 165*  BILITOT 0.4  PROT 7.2  ALBUMIN 2.9*   Recent Labs  Lab 06/17/20 0613 06/23/20 1200  LIPASE 1,083* 210*   CBG: Recent Labs  Lab 06/16/20 2336 06/17/20 0723 06/17/20 0922 06/17/20 1058 06/17/20 1312  GLUCAP 188* 187* 167* 141* 120*   Urine analysis:    Component Value Date/Time   COLORURINE STRAW (A) 06/23/2020 1519   APPEARANCEUR CLEAR 06/23/2020 1519   LABSPEC 1.015 06/23/2020 1519   PHURINE 7.0 06/23/2020 1519   GLUCOSEU >=500 (A) 06/23/2020 1519   HGBUR SMALL (A) 06/23/2020 1519   BILIRUBINUR NEGATIVE 06/23/2020 1519   KETONESUR NEGATIVE 06/23/2020 1519   PROTEINUR >=300 (A) 06/23/2020 1519   NITRITE NEGATIVE 06/23/2020 1519   LEUKOCYTESUR NEGATIVE 06/23/2020 1519    Radiological Exams on Admission: CT ABDOMEN PELVIS WO CONTRAST  Result Date: 06/23/2020 CLINICAL DATA:  Abdominal pain.  History of recent pancreatitis EXAM: CT ABDOMEN AND PELVIS WITHOUT CONTRAST TECHNIQUE: Multidetector CT imaging of the abdomen and pelvis was performed following the standard protocol without oral or IV contrast. COMPARISON:  June 12, 2020 FINDINGS: Lower chest: There is bibasilar  atelectasis. There is mild lower lobe bronchiectatic change. There is no lung base edema or consolidation. Hepatobiliary: No focal liver lesions are appreciable on this noncontrast enhanced study. Gallbladder is absent. There is no appreciable biliary duct dilatation. Pancreas: Subtle edema of the pancreas  appears stable compared to recent study. There is no pancreatic duct dilatation or pancreatic mass. There is no peripancreatic fluid or soft tissue stranding in the peripancreatic region. The appearance of the pancreas is stable compared to the recent prior study. Spleen: No splenic lesions are evident. Adrenals/Urinary Tract: Adrenals bilaterally appear normal. No evident renal mass or hydronephrosis on either side. There is no appreciable renal or ureteral calculus on either side. Urinary bladder is midline with wall thickness within normal limits. Stomach/Bowel: There is no appreciable bowel wall or mesenteric thickening. No appreciable bowel obstruction. The terminal ileum appears normal. There is no evident free air or portal venous air. Vascular/Lymphatic: No abdominal aortic aneurysm. There is aortic and iliac artery atherosclerotic calcification. Prominent left inguinal lymph nodes noted. The largest of these lymph nodes in the left inguinal region has a short axis diameter of 1.4 cm. There is also a prominent lymph node in the right inguinal region with a short axis diameter of 1.3 cm. No adenopathy evident elsewhere in the abdomen or pelvis. Reproductive: There are prostatic calculi. Prostate and seminal vesicles are normal in size and contour. Other: The appendix appears normal. No evident abscess or ascites in the abdomen or pelvis. There is fat in the right inguinal ring. Musculoskeletal: No blastic or lytic bone lesions. No intramuscular lesions. A mild degree of edema is noted in the abdominal and pelvic wall regions. IMPRESSION: 1. Stable subtle pancreatic edema, likely due to a degree of  developing pancreatitis. No pancreatic fluid or mass. No pancreatic duct dilatation. No new abnormality in the pancreas/peripancreatic region. 2. No bowel wall or mesenteric thickening. No bowel obstruction. No abscess in the abdomen or pelvis. Appendix appears normal. 3.  Gallbladder absent. 4. Mild inguinal adenopathy of uncertain etiology. Adenopathy of this nature potentially could have reactive etiology. 5. Mild edema in the abdominal and pelvic wall regions without associated fluid. Clinical significance of this finding uncertain. This finding may be due to early developing anasarca secondary to chronic renal failure. 6.  Mild lower lobe bronchiectatic change bilaterally. 7.  Aortic Atherosclerosis (ICD10-I70.0). Electronically Signed   By: Lowella Grip III M.D.   On: 06/23/2020 14:50   DG Chest 2 View  Result Date: 06/23/2020 CLINICAL DATA:  Epigastric pain. EXAM: CHEST - 2 VIEW COMPARISON:  04/25/2020 FINDINGS: Cardiopericardial silhouette is at upper limits of normal for size. Vascular congestion noted with diffuse interstitial opacities suggesting edema. No substantial pleural effusion. Right-sided IJ central line tip overlies the SVC/RA junction. Telemetry leads overlie the chest. IMPRESSION: Diffuse interstitial lung opacity suggests edema. Electronically Signed   By: Misty Stanley M.D.   On: 06/23/2020 14:22    EKG: Independently reviewed.,  Rate 81.  QTc prolonged 494.  No significant change from prior EKG.  Assessment/Plan Principal Problem:   Acute pancreatitis Active Problems:   Hypertensive heart disease with CHF (congestive heart failure) ([facility])   HTN (hypertension)   Systolic and diastolic CHF, chronic ([facility])   Type 2 diabetes mellitus with circulatory disorder, with long-term current use of insulin ([facility])   ESRD (end stage renal disease) ([place])   Acute pancreatitis-likely idiopathic in etiology.  Status post cholecystectomy.  Negative alcohol history.  Abdominal CT suggest  developing pancreatitis.  Lipase 210, prior recent elevations to 1092.  2 recent hospitalizations, patient left AMA.  Recent EGD by Dr. Abbey [place] 06/18/2019 showed gastritis. -Obtain lipid panel in the morning -N.p.o. - RL 50cc/hr X 20 hrs -IV Dilaudid 1 mg every 4 hourly as needed -  Phenergan as needed - iv Protonix '40mg'$  daILY  Prolonged QTC-494. Not new, similar to prior EKGs this year.  Potassium 3.8.  Magnesium 1.9. -Monitor on telemetry  Uncontrolled diabetes mellitus with retinopathy- blood glucose 304.  Hemoglobin A1c 9.2. -Hold home Lantus - SSI- S q6h  ESRD-schedule Tuesday Thursday Saturday.  Last HD was on Tuesday, 2 days ago. - EDP talked to Dr. Hollie Salk, patient can be dialyzed here tomorrow - May need to re-consult nephrology in the morning - Hold Sevelamer while NPO  Systolic and diastolic congestive heart failure-mildly decompensated.  BNP elevated 225.  CT shows mild edema in abdominal pelvic wall regions.  Makes urine, reports compliance with Lasix and torsemide. LAst echo  08/2019-EF 50 to 55%, indeterminate diastolic parameters. - Per HD -Will hold Lasix and torsemide for now, with n.p.o. status. -Strict input output, daily weight  Right BKA, left ray amputation  DVT prophylaxis: Heparin Code Status: Full code, confirmed with patient at bedside. Family Communication: None at bedside. Disposition Plan:  ~ 2 days Consults called: nephrology Admission status: Obs Tele   Leshon Armistead Arlyce Dice MD [facility]  06/23/2020, 7:30 PM

## 2020-06-23 NOTE — ED Provider Notes (Signed)
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 details).    MDM Rules/Calculators/A&P                          Initial impression-patient presents with epigastric pain.  He is alert, does not appear in acute distress, vital signs reassuring.  Concern for pancreatitis versus perforated stomach ulcer, choledocholithiasis.  Will obtain basic lab work-up, add on magnesium, BNP, provide antiemetics and narcotics and reevaluate.  Work-up-CBC shows slight normocytic anemia appears to be a baseline hemoglobin of 12.2, CMP shows slight hyponatremia of 134, slight increase in glucose of 304, increase in BUN of 34, creatinine 3.88 appears to be at baseline, slightly elevated alk phos of 165, appears to be decreased from previous lab work.  Lipase is 210 decreased from prior.  BMP shows 385, appears to be a  baseline for patient.  Magnesium 1.9.  Chest x-ray shows diffuse interstitial lung opacity suggestive of edema.  CT abdomen pelvis without contrast shows supple pancreatic edema likely due to developing pancreatitis, no pancreatic duct dilation present, shows mild edema in the abdomen pelvic walls likely secondary due to chronic renal failure.  Reassessment patient was reassessed after his first dose of Dilaudid, continues to have epigastric pain, he is slightly tender to palpation in his epigastric region.  Will provide patient with another dose of Dilaudid, p.o. challenge the patient.  Patient  was reassessed after another dose of Dilaudid.  Continues to have pain at this time. will try GI cocktail and Protonix and reassess.  Patient continues to have pain despite multiple rounds of Dilaudid, GI cocktail, Protonix.  At this point patient should be admitted for pain management of pancreatitis with unknown etiology.  Consult- due to uncontrolled epigastric pain secondary due to pancreatitis will consult with hospitalist for further evaluation.  Spoke with [facility] she has accept the patient will come down evaluate them.  Due to missed hemodialysis will speak with nephrology for further recommendations.  Spoke with Dr. Madelon Lips she feels patient could be discharged and have outpatient dialysis but if he  admitted she will dialysis patient tomorrow.  Rule out-I have low suspicion for systemic infection as patient is nontoxic-appearing, vital signs reassuring, no obvious source infection noted on exam or within lab work.  Low suspicion for ACS as patient has chest pain, shortness of breath, EKG sinus without signs of ischemia.  Low suspicion for emergent hemodialysis as patient is mentating without difficulty, vital signs remained stable, no rales heard on my exam, no fluid overload present.  Patient does have slightly elevated BNP of 385, shows slight edema on chest x-ray, and nonspecific edema with an  abdomen pelvis.  Suspect this secondary due to missed dialysis, anticipate will remain stable until next dialysis treatment.  Low suspicion for perforated stomach ulcer as abdomen is nondistended, normoactive bowel sounds, no peritoneal signs on my exam, imaging is reassuring.  Low suspicion for bowel obstruction as abdomen is nondistended, dull to percussion, patient is passing gas at difficulty, imaging is reassuring.  Low suspicion for necrotizing pancreatitis as there is no noted stranding around patient's pancreas on CT imaging.  Plan-I suspect patient suffering from uncomplicated acute pancreatitis.  Anticipate patient will need further observation and pain management.  Patient care will be transferred to admitting team.  Final Clinical Impression(s) / ED Diagnoses Final diagnoses:  Acute pancreatitis without infection or necrosis, unspecified pancreatitis type    Rx / DC Orders ED Discharge Orders    None       Marcello Fennel, PA-C 06/23/20 1818    Fredia Sorrow, MD 07/05/20 [phone]

## 2020-06-23 NOTE — ED Triage Notes (Signed)
Patient brought in via EMS for abd pain. Patient alert and oriented. Airway patent. Patient seen last month here in ED and diagnosed with pancreatitis but left AMA. Patient started having severe abd pain this morning when he arrived to dialysis. Patient vomited x3. Per patient also had some diarrhea. Denies any fevers. Patient was not dialyzed due to abd pain. Patient's blood glucose 395 per paramedic.

## 2020-06-23 NOTE — ED Notes (Signed)
Pt refusing to leave cardiac leads on

## 2020-06-23 NOTE — ED Notes (Signed)
Pt asleep with phone sitting on his chest. When I go in to check on him he awakens and begins rubbing his stomach saying he is still in pain. Eyes are heavy and he has thick speech.

## 2020-06-23 NOTE — ED Notes (Signed)
Attempted report x1. 

## 2020-06-23 NOTE — ED Notes (Signed)
Attempted report x 2 

## 2020-06-23 NOTE — ED Notes (Signed)
PO gingerale given. Pt states pain meds are not working and he would like a percocet.

## 2020-06-24 DIAGNOSIS — Z91030 Bee allergy status: Secondary | ICD-10-CM | POA: Diagnosis not present

## 2020-06-24 DIAGNOSIS — E1122 Type 2 diabetes mellitus with diabetic chronic kidney disease: Secondary | ICD-10-CM | POA: Diagnosis present

## 2020-06-24 DIAGNOSIS — E785 Hyperlipidemia, unspecified: Secondary | ICD-10-CM | POA: Diagnosis present

## 2020-06-24 DIAGNOSIS — Z89511 Acquired absence of right leg below knee: Secondary | ICD-10-CM | POA: Diagnosis not present

## 2020-06-24 DIAGNOSIS — I10 Essential (primary) hypertension: Secondary | ICD-10-CM

## 2020-06-24 DIAGNOSIS — Z885 Allergy status to narcotic agent status: Secondary | ICD-10-CM | POA: Diagnosis not present

## 2020-06-24 DIAGNOSIS — K8500 Idiopathic acute pancreatitis without necrosis or infection: Secondary | ICD-10-CM | POA: Diagnosis present

## 2020-06-24 DIAGNOSIS — Z888 Allergy status to other drugs, medicaments and biological substances status: Secondary | ICD-10-CM | POA: Diagnosis not present

## 2020-06-24 DIAGNOSIS — N186 End stage renal disease: Secondary | ICD-10-CM | POA: Diagnosis present

## 2020-06-24 DIAGNOSIS — Z794 Long term (current) use of insulin: Secondary | ICD-10-CM | POA: Diagnosis not present

## 2020-06-24 DIAGNOSIS — Z20822 Contact with and (suspected) exposure to covid-19: Secondary | ICD-10-CM | POA: Diagnosis present

## 2020-06-24 DIAGNOSIS — E8889 Other specified metabolic disorders: Secondary | ICD-10-CM | POA: Diagnosis present

## 2020-06-24 DIAGNOSIS — E1165 Type 2 diabetes mellitus with hyperglycemia: Secondary | ICD-10-CM

## 2020-06-24 DIAGNOSIS — Z7982 Long term (current) use of aspirin: Secondary | ICD-10-CM | POA: Diagnosis not present

## 2020-06-24 DIAGNOSIS — J479 Bronchiectasis, uncomplicated: Secondary | ICD-10-CM | POA: Diagnosis present

## 2020-06-24 DIAGNOSIS — D631 Anemia in chronic kidney disease: Secondary | ICD-10-CM | POA: Diagnosis present

## 2020-06-24 DIAGNOSIS — I2510 Atherosclerotic heart disease of native coronary artery without angina pectoris: Secondary | ICD-10-CM | POA: Diagnosis present

## 2020-06-24 DIAGNOSIS — Z992 Dependence on renal dialysis: Secondary | ICD-10-CM | POA: Diagnosis not present

## 2020-06-24 DIAGNOSIS — K859 Acute pancreatitis without necrosis or infection, unspecified: Secondary | ICD-10-CM | POA: Diagnosis present

## 2020-06-24 DIAGNOSIS — F17210 Nicotine dependence, cigarettes, uncomplicated: Secondary | ICD-10-CM | POA: Diagnosis present

## 2020-06-24 DIAGNOSIS — Z79899 Other long term (current) drug therapy: Secondary | ICD-10-CM | POA: Diagnosis not present

## 2020-06-24 DIAGNOSIS — E782 Mixed hyperlipidemia: Secondary | ICD-10-CM | POA: Diagnosis present

## 2020-06-24 DIAGNOSIS — E11319 Type 2 diabetes mellitus with unspecified diabetic retinopathy without macular edema: Secondary | ICD-10-CM | POA: Diagnosis present

## 2020-06-24 DIAGNOSIS — I132 Hypertensive heart and chronic kidney disease with heart failure and with stage 5 chronic kidney disease, or end stage renal disease: Secondary | ICD-10-CM | POA: Diagnosis present

## 2020-06-24 DIAGNOSIS — I5042 Chronic combined systolic (congestive) and diastolic (congestive) heart failure: Secondary | ICD-10-CM | POA: Diagnosis present

## 2020-06-24 LAB — CBC
HCT: 38.3 % — ABNORMAL LOW (ref 39.0–52.0)
Hemoglobin: 11.6 g/dL — ABNORMAL LOW (ref 13.0–17.0)
MCH: 26.2 pg (ref 26.0–34.0)
MCHC: 30.3 g/dL (ref 30.0–36.0)
MCV: 86.7 fL (ref 80.0–100.0)
Platelets: 260 10*3/uL (ref 150–400)
RBC: 4.42 MIL/uL (ref 4.22–5.81)
RDW: 18.3 % — ABNORMAL HIGH (ref 11.5–15.5)
WBC: 4.8 10*3/uL (ref 4.0–10.5)
nRBC: 0 % (ref 0.0–0.2)

## 2020-06-24 LAB — BASIC METABOLIC PANEL
Anion gap: 9 (ref 5–15)
BUN: 31 mg/dL — ABNORMAL HIGH (ref 6–20)
CO2: 24 mmol/L (ref 22–32)
Calcium: 8.5 mg/dL — ABNORMAL LOW (ref 8.9–10.3)
Chloride: 106 mmol/L (ref 98–111)
Creatinine, Ser: 3.9 mg/dL — ABNORMAL HIGH (ref 0.61–1.24)
GFR, Estimated: 17 mL/min — ABNORMAL LOW (ref >60)
Glucose, Bld: 125 mg/dL — ABNORMAL HIGH (ref 70–99)
Potassium: 3.6 mmol/L (ref 3.5–5.1)
Sodium: 139 mmol/L (ref 135–145)

## 2020-06-24 LAB — LIPID PANEL
Cholesterol: 223 mg/dL — ABNORMAL HIGH (ref 0–200)
HDL: 39 mg/dL — ABNORMAL LOW (ref >40)
LDL Cholesterol: 148 mg/dL — ABNORMAL HIGH (ref 0–99)
Total CHOL/HDL Ratio: 5.7 RATIO
Triglycerides: 179 mg/dL — ABNORMAL HIGH (ref <150)
VLDL: 36 mg/dL (ref 0–40)

## 2020-06-24 LAB — RAPID URINE DRUG SCREEN, HOSP PERFORMED
Amphetamines: NOT DETECTED
Barbiturates: NOT DETECTED
Benzodiazepines: NOT DETECTED
Cocaine: NOT DETECTED
Opiates: POSITIVE — AB
Tetrahydrocannabinol: NOT DETECTED

## 2020-06-24 LAB — GLUCOSE, CAPILLARY
Glucose-Capillary: 157 mg/dL — ABNORMAL HIGH (ref 70–99)
Glucose-Capillary: 130 mg/dL — ABNORMAL HIGH (ref 70–99)
Glucose-Capillary: 114 mg/dL — ABNORMAL HIGH (ref 70–99)
Glucose-Capillary: 97 mg/dL (ref 70–99)
Glucose-Capillary: 79 mg/dL (ref 70–99)
Glucose-Capillary: 77 mg/dL (ref 70–99)

## 2020-06-24 MED ORDER — CHLORHEXIDINE GLUCONATE CLOTH 2 % EX PADS
6.0000 | MEDICATED_PAD | Freq: Every day | CUTANEOUS | Status: DC
  Administered 2020-06-25: 6 via TOPICAL

## 2020-06-24 MED ORDER — CHLORHEXIDINE GLUCONATE CLOTH 2 % EX PADS: 6.0000 | MEDICATED_PAD | Freq: Every day | CUTANEOUS | Status: DC

## 2020-06-24 MED ORDER — DIPHENHYDRAMINE HCL 25 MG PO CAPS
25.0000 mg | ORAL_CAPSULE | Freq: Once | ORAL | Status: AC
  Administered 2020-06-24: 25 mg via ORAL
  Filled 2020-06-24: qty 1

## 2020-06-24 NOTE — Progress Notes (Signed)
PROGRESS NOTE  Zachary Young FBP:[id number] DOB: [birth year] DOA: 06/23/2020 PCP: Mellissa Kohut, DO  Brief History:  60 year old male with history of ESRD (TTS), diabetes mellitus type 2, hyperlipidemia, coronary artery disease, osteomyelitis status post right BKA, and tobacco abuse presenting with 1 day history of nausea, vomiting, and abdominal pain.  He denies any fevers, chills, chest pain, shortness breath, hemoptysis, hematemesis, hematochezia, melena.  Notably, the patient had 2 recent hospital admissions for acute pancreatitis for which she left AGAINST MEDICAL ADVICE.  He was admitted from 06/16/2020 to 06/17/2020 with elevated lipase up to 1092.  He underwent EGD which showed gastritis.  Right upper quadrant ultrasound of admission was negative for any acute findings.  It was felt the patient had idiopathic pancreatitis and his hematemesis was likely due to Mallory-Weiss tear.  In addition, the patient also had an admission from 06/12/2020 to 06/13/2020 for acute pancreatitis for which he left AGAINST MEDICAL ADVICE. He denies any alcohol or illicit drug use or any new medications.  He complains of some loose stools without any hematochezia or melena.  He denies any new medications although he has very poor insight regarding his medications. In the emergency department, the patient was afebrile hemodynamically stable with oxygen saturation 98% on room air.  LFTs were unremarkable, but lipase was 210.  BMP showed a sodium 134, potassium 3.8, bicarbonate 24, serum creatinine 3.88.  WBC 6.2, hemoglobin 12.2, platelets 292,000. Nephrology was consulted for maintenance dialysis.  The patient was admitted for further evaluation of his acute pancreatitis.  Assessment/Plan: Acute idiopathic pancreatitis -Triglycerides 179 -06/13/2020 right upper quadrant ultrasound negative for acute findings -Remain n.p.o. except for sips of water -Judicious IV fluids -Judicious opioids -Obtain urine drug  screen -In review of his medication list, certainly furosemide may be partly contributory -3010 CT abdomen--subtle stable pancreatic edema without pancreatic fluid collection or abscess.  Mild edema in the abdomen and pelvic walls suspecting anasarca; bilateral lower lobe bronchiectasis  Uncontrolled diabetes mellitus type 2 with hyperglycemia -Reduced dose Lantus -NovoLog sliding scale -06/05/2020 hemoglobin A1c 9.2  ESRD -Last dialyzed on 06/21/2020 -Nephrology consulted for maintenance dialysis  Mixed hyperlipidemia -LDL 148 -Triglycerides 179 -Total cholesterol 223 -Start statin  Chronic systolic and diastolic CHF -The patient does appear a bit hypervolemic, but suspect this is related to his ESRD and missing dialysis -52/21 echo EF 50-55%, no WMA, trivial TR  Bronchiectasis/tobacco abuse -No wheezing on exam -Tobacco cessation discussed -Stable on room air -Personally reviewed chest x-ray--increased interstitial markings  Right BKA status -PT evaluation once stable     Status is: Observation  The patient will require care spanning > 2 midnights and should be moved to inpatient because: IV treatments appropriate due to intensity of illness or inability to take PO  Dispo: The patient is from: Home              Anticipated d/c is to: Home              Patient currently is not medically stable to d/c.   Difficult to place patient No        Family Communication:   No Family at bedside  Consultants:  renal  Code Status:  FULL  DVT Prophylaxis:  [place] Heparin    Procedures: As Listed in Progress Note Above  Antibiotics: None      Subjective: Patient continues to complain of epigastric abdominal pain.  He only states that it is a little  bit better.  He has not had any emesis since arrival to the medical floor.  He complains of nausea.  He denies any diarrhea.  He denies any chest pain or shortness of breath.  He denies any headache, fevers, chills,  coughing, hemoptysis.  Objective: Vitals:   06/23/20 2058 06/24/20 0203 06/24/20 0339 06/24/20 0449  BP: (!) 177/95 (!) 146/91  (!) 156/91  Pulse: 85 79  80  Resp: _0 Temp: 97.7 F (36.5 C) 97.6 F (36.4 C)  98 F (36.7 C)  TempSrc: Oral Oral    SpO2: 99% 99%  98%  Weight:   90.3 kg   Height:        Intake/Output Summary (Last 24 hours) at 06/24/2020 0809 Last data filed at 06/24/2020 0600 Gross per 24 hour  Intake 273.3 ml  Output 400 ml  Net -126.7 ml   Weight change:  Exam:   General:  Pt is alert, follows commands appropriately, not in acute distress  HEENT: No icterus, No thrush, No neck mass, [place]/AT  Cardiovascular: RRR, S1/S2, no rubs, no gallops  Respiratory: Diminished breath sounds bibasilar.  Bibasilar rales but no wheezing.  Abdomen: Soft/+BS, non tender, non distended, no guarding  Extremities: Right BKA--site without any erythema or drainage.  Left lower extremity without any synovitis or drainage   Data Reviewed: I have personally reviewed following labs and imaging studies Basic Metabolic Panel: Recent Labs  Lab 06/23/20 1200 06/24/20 0616  NA 134* 139  K 3.8 3.6  CL 100 106  CO2 24 24  GLUCOSE 304* 125*  BUN 34* 31*  CREATININE 3.88* 3.90*  CALCIUM 9.0 8.5*  MG 1.9  --    Liver Function Tests: Recent Labs  Lab 06/23/20 1200  AST 11*  ALT 9  ALKPHOS 165*  BILITOT 0.4  PROT 7.2  ALBUMIN 2.9*   Recent Labs  Lab 06/23/20 1200  LIPASE 210*   No results for input(s): AMMONIA in the last 168 hours. Coagulation Profile: No results for input(s): INR, PROTIME in the last 168 hours. CBC: Recent Labs  Lab 06/23/20 1200 06/24/20 0616  WBC 6.2 4.8  HGB 12.2* 11.6*  HCT 40.0 38.3*  MCV 86.4 86.7  PLT 292 260   Cardiac Enzymes: No results for input(s): CKTOTAL, CKMB, CKMBINDEX, TROPONINI in the last 168 hours. BNP: Invalid input(s): POCBNP CBG: Recent Labs  Lab 06/23/20 2118 06/23/20 2357 06/24/20 0202  06/24/20 0447 06/24/20 0800  GLUCAP 212* 187* 157* 130* 114*   HbA1C: No results for input(s): HGBA1C in the last 72 hours. Urine analysis:    Component Value Date/Time   COLORURINE STRAW (A) 06/23/2020 1519   APPEARANCEUR CLEAR 06/23/2020 1519   LABSPEC 1.015 06/23/2020 1519   PHURINE 7.0 06/23/2020 1519   GLUCOSEU >=500 (A) 06/23/2020 1519   HGBUR SMALL (A) 06/23/2020 1519   BILIRUBINUR NEGATIVE 06/23/2020 1519   KETONESUR NEGATIVE 06/23/2020 1519   PROTEINUR >=300 (A) 06/23/2020 1519   NITRITE NEGATIVE 06/23/2020 1519   LEUKOCYTESUR NEGATIVE 06/23/2020 1519   Sepsis Labs: _1 (procalcitonin:4,lacticidven:4) ) Recent Results (from the past 240 hour(s))  SARS CORONAVIRUS 2 (TAT 6-24 HRS) Nasopharyngeal Nasopharyngeal Swab     Status: None   Collection Time: 06/16/20 11:53 AM   Specimen: Nasopharyngeal Swab  Result Value Ref Range Status   SARS Coronavirus 2 NEGATIVE NEGATIVE Final    Comment: (NOTE) SARS-CoV-2 target nucleic acids are NOT DETECTED.  The SARS-CoV-2 RNA is generally detectable in upper and lower respiratory specimens  during the acute phase of infection. Negative results do not preclude SARS-CoV-2 infection, do not rule out co-infections with other pathogens, and should not be used as the sole basis for treatment or other patient management decisions. Negative results must be combined with clinical observations, patient history, and epidemiological information. The expected result is Negative.  Fact Sheet for Patients: SugarRoll.be  Fact Sheet for Healthcare Providers: https://www.woods-mathews.com/  This test is not yet approved or cleared by the [place] FDA and  has been authorized for detection and/or diagnosis of SARS-CoV-2 by FDA under an Emergency Use Authorization (EUA). This EUA will remain  in effect (meaning this test can be used) for the duration of the COVID-19 declaration under Se  ction 564(b)(1) of the Act, 21 U.S.C. section 360bbb-3(b)(1), unless the authorization is terminated or revoked sooner.  Performed at [facility], [place] [address], [place], [place] [address]   Resp Panel by RT-PCR (Flu A&B, Covid) Nasopharyngeal Swab     Status: None   Collection Time: 06/23/20  3:19 PM   Specimen: Nasopharyngeal Swab; Nasopharyngeal(NP) swabs in vial transport medium  Result Value Ref Range Status   SARS Coronavirus 2 by RT PCR NEGATIVE NEGATIVE Final    Comment: (NOTE) SARS-CoV-2 target nucleic acids are NOT DETECTED.  The SARS-CoV-2 RNA is generally detectable in upper respiratory specimens during the acute phase of infection. The lowest concentration of SARS-CoV-2 viral copies this assay can detect is 138 copies/mL. A negative result does not preclude SARS-Cov-2 infection and should not be used as the sole basis for treatment or other patient management decisions. A negative result may occur with  improper specimen collection/handling, submission of specimen other than nasopharyngeal swab, presence of viral mutation(s) within the areas targeted by this assay, and inadequate number of viral copies(<138 copies/mL). A negative result must be combined with clinical observations, patient history, and epidemiological information. The expected result is Negative.  Fact Sheet for Patients:  EntrepreneurPulse.com.au  Fact Sheet for Healthcare Providers:  IncredibleEmployment.be  This test is no t yet approved or cleared by the [place] FDA and  has been authorized for detection and/or diagnosis of SARS-CoV-2 by FDA under an Emergency Use Authorization (EUA). This EUA will remain  in effect (meaning this test can be used) for the duration of the COVID-19 declaration under Section 564(b)(1) of the Act, 21 U.S.C.section 360bbb-3(b)(1), unless the authorization is terminated  or revoked sooner.       Influenza A by PCR  NEGATIVE NEGATIVE Final   Influenza B by PCR NEGATIVE NEGATIVE Final    Comment: (NOTE) The Xpert Xpress SARS-CoV-2/FLU/RSV plus assay is intended as an aid in the diagnosis of influenza from Nasopharyngeal swab specimens and should not be used as a sole basis for treatment. Nasal washings and aspirates are unacceptable for Xpert Xpress SARS-CoV-2/FLU/RSV testing.  Fact Sheet for Patients: EntrepreneurPulse.com.au  Fact Sheet for Healthcare Providers: IncredibleEmployment.be  This test is not yet approved or cleared by the [place] FDA and has been authorized for detection and/or diagnosis of SARS-CoV-2 by FDA under an Emergency Use Authorization (EUA). This EUA will remain in effect (meaning this test can be used) for the duration of the COVID-19 declaration under Section 564(b)(1) of the Act, 21 U.S.C. section 360bbb-3(b)(1), unless the authorization is terminated or revoked.  Performed at [facility], [address] [place], [place] [address]      Scheduled Meds: . Chlorhexidine Gluconate Cloth  6 each Topical Daily  . heparin  5,000  Units Subcutaneous Q8H  . insulin aspart  0-15 Units Subcutaneous Q6H  . pantoprazole (PROTONIX) IV  40 mg Intravenous Q24H   Continuous Infusions: . lactated ringers 50 mL/hr at 06/23/20 2152    Procedures/Studies: CT ABDOMEN PELVIS WO CONTRAST  Result Date: 06/23/2020 CLINICAL DATA:  Abdominal pain.  History of recent pancreatitis EXAM: CT ABDOMEN AND PELVIS WITHOUT CONTRAST TECHNIQUE: Multidetector CT imaging of the abdomen and pelvis was performed following the standard protocol without oral or IV contrast. COMPARISON:  June 12, 2020 FINDINGS: Lower chest: There is bibasilar atelectasis. There is mild lower lobe bronchiectatic change. There is no lung base edema or consolidation. Hepatobiliary: No focal liver lesions are appreciable on this noncontrast enhanced study. Gallbladder is absent. There  is no appreciable biliary duct dilatation. Pancreas: Subtle edema of the pancreas appears stable compared to recent study. There is no pancreatic duct dilatation or pancreatic mass. There is no peripancreatic fluid or soft tissue stranding in the peripancreatic region. The appearance of the pancreas is stable compared to the recent prior study. Spleen: No splenic lesions are evident. Adrenals/Urinary Tract: Adrenals bilaterally appear normal. No evident renal mass or hydronephrosis on either side. There is no appreciable renal or ureteral calculus on either side. Urinary bladder is midline with wall thickness within normal limits. Stomach/Bowel: There is no appreciable bowel wall or mesenteric thickening. No appreciable bowel obstruction. The terminal ileum appears normal. There is no evident free air or portal venous air. Vascular/Lymphatic: No abdominal aortic aneurysm. There is aortic and iliac artery atherosclerotic calcification. Prominent left inguinal lymph nodes noted. The largest of these lymph nodes in the left inguinal region has a short axis diameter of 1.4 cm. There is also a prominent lymph node in the right inguinal region with a short axis diameter of 1.3 cm. No adenopathy evident elsewhere in the abdomen or pelvis. Reproductive: There are prostatic calculi. Prostate and seminal vesicles are normal in size and contour. Other: The appendix appears normal. No evident abscess or ascites in the abdomen or pelvis. There is fat in the right inguinal ring. Musculoskeletal: No blastic or lytic bone lesions. No intramuscular lesions. A mild degree of edema is noted in the abdominal and pelvic wall regions. IMPRESSION: 1. Stable subtle pancreatic edema, likely due to a degree of developing pancreatitis. No pancreatic fluid or mass. No pancreatic duct dilatation. No new abnormality in the pancreas/peripancreatic region. 2. No bowel wall or mesenteric thickening. No bowel obstruction. No abscess in the abdomen  or pelvis. Appendix appears normal. 3.  Gallbladder absent. 4. Mild inguinal adenopathy of uncertain etiology. Adenopathy of this nature potentially could have reactive etiology. 5. Mild edema in the abdominal and pelvic wall regions without associated fluid. Clinical significance of this finding uncertain. This finding may be due to early developing anasarca secondary to chronic renal failure. 6.  Mild lower lobe bronchiectatic change bilaterally. 7.  Aortic Atherosclerosis (ICD10-I70.0). Electronically Signed   By: Lowella Grip III M.D.   On: 06/23/2020 14:50   CT ABDOMEN PELVIS WO CONTRAST  Result Date: 06/12/2020 CLINICAL DATA:  Acute nonlocalized abdominal pain. Vomiting. Renal failure. Dialysis patient. EXAM: CT ABDOMEN AND PELVIS WITHOUT CONTRAST TECHNIQUE: Multidetector CT imaging of the abdomen and pelvis was performed following the standard protocol without IV contrast. COMPARISON:  12/14/2018 FINDINGS: Lower chest: Interstitial and early alveolar pattern in the lower lungs consistent with heart failure or fluid overload. No consolidation or collapse. No pleural effusion. Hepatobiliary: Liver parenchyma appears normal without contrast. Previous cholecystectomy. Pancreas:  There appears to be mild swelling with surrounding edema in the region of the body of the pancreas suggesting early or mild pancreatitis. Spleen: Normal Adrenals/Urinary Tract: Adrenal glands are normal. Kidneys are normal. Bladder is normal. Stomach/Bowel: Stomach and small intestine are normal. Normal appendix. No colon abnormality seen. Vascular/Lymphatic: Aortic atherosclerosis. No aneurysm. IVC is normal. No adenopathy. Reproductive: Normal Other: No free fluid or air. Musculoskeletal: Right inguinal hernia containing fat. Mild lower lumbar degenerative changes. IMPRESSION: Lung bases show interstitial and early alveolar edema consistent with fluid overload. Mild swelling of the body of the pancreas with mild surrounding  edema consistent with early/mild pancreatitis. Electronically Signed   By: Nelson Chimes M.D.   On: 06/12/2020 21:56   DG Chest 2 View  Result Date: 06/23/2020 CLINICAL DATA:  Epigastric pain. EXAM: CHEST - 2 VIEW COMPARISON:  04/25/2020 FINDINGS: Cardiopericardial silhouette is at upper limits of normal for size. Vascular congestion noted with diffuse interstitial opacities suggesting edema. No substantial pleural effusion. Right-sided IJ central line tip overlies the SVC/RA junction. Telemetry leads overlie the chest. IMPRESSION: Diffuse interstitial lung opacity suggests edema. Electronically Signed   By: Misty Stanley M.D.   On: 06/23/2020 14:22   IR Removal Tun Cv Cath W/O FL  Result Date: 06/01/2020 INDICATION: Patient with history of end-stage renal disease on dialysis via tunneled HD catheter placed 11/06/2019 by Dr. Earleen Newport. He now has a working AV graft and is no longer in need of his tunneled dialysis catheter. Request is made for removal. EXAM: REMOVAL TUNNELED CENTRAL VENOUS CATHETER MEDICATIONS: 10 mL 1% lidocaine ANESTHESIA/SEDATION: None FLUOROSCOPY TIME:  None COMPLICATIONS: None immediate. PROCEDURE: Informed written consent was obtained from the patient after a thorough discussion of the procedural risks, benefits and alternatives. All questions were addressed. Maximal Sterile Barrier Technique was utilized including caps, mask, sterile gowns, sterile gloves, sterile drape, hand hygiene and skin antiseptic. A timeout was performed prior to the initiation of the procedure. The patient's right chest and catheter was prepped and draped in a normal sterile fashion. Heparin was removed from both ports of catheter. 1% lidocaine was used for local anesthesia. Using gentle blunt dissection the cuff of the catheter was exposed and the catheter was removed in its entirety. Pressure was held till hemostasis was obtained. A sterile dressing was applied. The patient tolerated the procedure well with no  immediate complications. IMPRESSION: Successful catheter removal as described above. Read by: Brynda Greathouse PA-C Electronically Signed   By: Jacqulynn Cadet M.D.   On: 06/01/2020 15:09   IR US Guide Vasc Access Left  Result Date: 06/06/2020 INDICATION: 60 year old male with left axillary to axillary loop arteriovenous hemodialysis graft created 04/05/2020 presenting with graft thrombosis. EXAM: 1. Ultrasound-guided arteriovenous graft access x2 2. Pharmacomechanical thrombolysis of arteriovenous graft 3. Rheolytic thrombectomy of arteriovenous graft 4. Balloon sweep thrombectomy of arterial anastomosis 5. Balloon angioplasty of arteriovenous graft COMPARISON:  None. MEDICATIONS: None. CONTRAST:  19m OMNIPAQUE IOHEXOL 300 MG/ML  SOLN ANESTHESIA/SEDATION: Moderate (conscious) sedation was employed during this procedure. A total of Versed 1.5 mg and Fentanyl 75 mcg was administered intravenously. Moderate Sedation Time: 99 minutes. The patient's level of consciousness and vital signs were monitored continuously by radiology nursing throughout the procedure under my direct supervision. FLUOROSCOPY TIME:  19 minutes, 18 seconds, 81 mGy COMPLICATIONS: None immediate. PROCEDURE: Informed written consent was obtained from the patient after a discussion of the risk, benefits and alternatives to treatment. Questions regarding the procedure were encouraged and answered. A timeout was  performed prior to the initiation of the procedure. The left arm dialysis graft was prepped with Chlorhexidine in a sterile fashion, and a sterile drape was applied covering the operative field. Ultrasound evaluation of the entire graft was performed. Antegrade access near the graft apex was performed under ultrasound guidance with a micropuncture set. Over a Wholey wire, a 6 French sheath was placed. A Glidewire and angled catheter were directed to the left subclavian vein. Pull-back venogram was performed. Pharmacomechanical thrombolysis  was performed of the venous outflow portion of the graft. Retrograde access was then obtained under ultrasound guidance with a micropuncture set. Over a Wholey wire, a 6 French sheath was placed. A Glidewire and angled catheter were directed to the axillary artery. Pharmacomechanical thrombolysis was performed in the arterial limb of the graft. Using a Fogarty balloon, balloon sweep was performed of the arterial anastomosis 3 times. Rheolytic thrombectomy was then performed throughout the arterial limb of the graft. This was followed by rheolytic therapy throughout the venous aspect of the graft. Fistulagram from the axillary artery demonstrated severe focal stenosis near the arterial anastomosis. Balloon angioplasty was performed at the stenotic site with a 4 mm x 2 cm mustang balloon. There is improved patency inflow after angioplasty. Additional moderate stenosis was noted in the graft apex. Balloon angioplasty with a 7 mm x 4 cm mustang balloon was performed. There are multifocal stenoses near the axillary vein anastomosis. Balloon angioplasty was performed multiple sites. Completion fistulagram was then performed. The sheaths was removed and hemostasis obtained with application of a 2-0 Ethilon pursestring suture which will be removed at the patient's next dialysis session. Dressings were placed. The patient tolerated the procedure well without immediate postprocedural complication. FINDINGS: Complete thrombosis of the left axillary to axillary loop arteriovenous graft. Technically successful pharmacomechanical thrombolysis and rheolytic thrombectomy throughout the graft. High-grade focal stenosis at the graft arterial anastomosis which improved after angioplasty. Additional angioplasty was required at the graft apex as well as the venous anastomosis. Self-limiting axillary hematoma was observed after wire cannulation cross the venous anastomosis. There is complete cessation of extravasation after balloon  tamponade. Upon completion, there was palpable thrill throughout the graft and brisk antegrade flow on completion fistulogram. IMPRESSION: 1. Thrombosed left axillary to axillary loop arteriovenous graft. 2. Technically successful declot of graft with pharmacomechanical thrombolysis, rheolytic thrombectomy, Fogarty arterial anastomosis balloon sweep, and balloon angioplasty. ACCESS: This access remains amenable to future percutaneous interventions as clinically indicated. Ruthann Cancer, MD [facility] Electronically Signed   By: Ruthann Cancer MD   On: 06/06/2020 17:42   IR US Guide Vasc Access Right  Result Date: 06/07/2020 INDICATION: History of end-stage renal disease post attempt at left upper arm declot (performed yesterday 06/06/2020) with early rethrombosis. Patient presents today for image guided placement of a tunneled dialysis catheter. EXAM: TUNNELED CENTRAL VENOUS HEMODIALYSIS CATHETER PLACEMENT WITH ULTRASOUND AND FLUOROSCOPIC GUIDANCE MEDICATIONS: Ancef 2 gm IV . The antibiotic was given in an appropriate time interval prior to skin puncture. ANESTHESIA/SEDATION: Moderate (conscious) sedation was employed during this procedure. A total of Versed 0.5 mg and Fentanyl 25 mcg was administered intravenously. Moderate Sedation Time: 17 minutes. The patient's level of consciousness and vital signs were monitored continuously by radiology nursing throughout the procedure under my direct supervision. Comparisons: Image guided left upper arm dialysis graft thrombolysis-06/06/2020 FLUOROSCOPY TIME:  30 seconds (16 mGy) COMPLICATIONS: None immediate. PROCEDURE: Informed written consent was obtained from the patient after a discussion of the risks,  benefits, and alternatives to treatment. Questions regarding the procedure were encouraged and answered. The right neck and chest were prepped with chlorhexidine in a sterile fashion, and a sterile drape was  applied covering the operative field. Maximum barrier sterile technique with sterile gowns and gloves were used for the procedure. A timeout was performed prior to the initiation of the procedure. After creating a small venotomy incision, a micropuncture kit was utilized to access the internal jugular vein. Real-time ultrasound guidance was utilized for vascular access including the acquisition of a permanent ultrasound image documenting patency of the accessed vessel. The microwire was utilized to measure appropriate catheter length. A stiff Glidewire was advanced to the level of the IVC and the micropuncture sheath was exchanged for a peel-away sheath. A palindrome tunneled hemodialysis catheter measuring 19 cm from tip to cuff was tunneled in a retrograde fashion from the anterior chest wall to the venotomy incision. The catheter was then placed through the peel-away sheath with tips ultimately positioned within the superior aspect of the right atrium. Final catheter positioning was confirmed and documented with a spot radiographic image. The catheter aspirates and flushes normally. The catheter was flushed with appropriate volume heparin dwells. The catheter exit site was secured with a 0-Prolene retention suture. The venotomy incision was closed with Dermabond and Steri-strips. Dressings were applied. The patient tolerated the procedure well without immediate post procedural complication. IMPRESSION: Successful placement of 19 cm tip to cuff tunneled hemodialysis catheter via the right internal jugular vein with tips terminating within the superior aspect of the right atrium. The catheter is ready for immediate use. Electronically Signed   By: Sandi Mariscal M.D.   On: 06/07/2020 14:04   VAS US CAROTID  Result Date: 06/23/2020 Carotid Arterial Duplex Study Indications:       Convulsive syncope. Limitations        Today's exam was limited due to the high bifurcation of the                    carotid. Comparison  Study:  TCD doppler study 06-22-2020 available. Performing Technologist: Darlin Coco RDMS,RVT  Examination Guidelines: A complete evaluation includes B-mode imaging, spectral Doppler, color Doppler, and power Doppler as needed of all accessible portions of each vessel. Bilateral testing is considered an integral part of a complete examination. Limited examinations for reoccurring indications may be performed as noted.  Right Carotid Findings: +----------+--------+--------+--------+------------------+--------+           PSV cm/sEDV cm/sStenosisPlaque DescriptionComments +----------+--------+--------+--------+------------------+--------+ CCA Prox  91      12                                         +----------+--------+--------+--------+------------------+--------+ CCA Distal68      19              heterogenous               +----------+--------+--------+--------+------------------+--------+ ICA Prox  58      17      1-39%   heterogenous               +----------+--------+--------+--------+------------------+--------+ ICA Distal84      28                                         +----------+--------+--------+--------+------------------+--------+  ECA       106     16                                         +----------+--------+--------+--------+------------------+--------+ +----------+--------+-------+----------------+-------------------+           PSV cm/sEDV cmsDescribe        Arm Pressure (mmHG) +----------+--------+-------+----------------+-------------------+ [id number]            Multiphasic, WNL                    +----------+--------+-------+----------------+-------------------+ +---------+--------+--+--------+--+---------+ VertebralPSV cm/s43EDV cm/s13Antegrade +---------+--------+--+--------+--+---------+  Left Carotid Findings: +----------+--------+--------+--------+------------------+--------+           PSV cm/sEDV cm/sStenosisPlaque  DescriptionComments +----------+--------+--------+--------+------------------+--------+ CCA Prox  114     22                                         +----------+--------+--------+--------+------------------+--------+ CCA Distal76      18              heterogenous               +----------+--------+--------+--------+------------------+--------+ ICA Prox  50      17      1-39%   heterogenous               +----------+--------+--------+--------+------------------+--------+ ICA Distal83      33                                         +----------+--------+--------+--------+------------------+--------+ ECA       79      16                                         +----------+--------+--------+--------+------------------+--------+ +----------+--------+--------+----------------+-------------------+           PSV cm/sEDV cm/sDescribe        Arm Pressure (mmHG) +----------+--------+--------+----------------+-------------------+ [id number]             Multiphasic, WNL                    +----------+--------+--------+----------------+-------------------+ +---------+--------+--+--------+--+---------+ VertebralPSV cm/s49EDV cm/s15Antegrade +---------+--------+--+--------+--+---------+   Summary: Right Carotid: Velocities in the right ICA are consistent with a 1-39% stenosis. Left Carotid: Velocities in the left ICA are consistent with a 1-39% stenosis. Vertebrals:  Bilateral vertebral arteries demonstrate antegrade flow. Subclavians: Normal flow hemodynamics were seen in bilateral subclavian              arteries. *See table(s) above for measurements and observations.  Electronically signed by Antony Contras MD on 06/23/2020 at 11:42:46 AM.    Final    VAS [place] TRANSCRANIAL DOPPLER  Result Date: 06/23/2020  Transcranial Doppler Indications: Convulsive syncope. Comparison Study: 06-22-2020 Carotid duplex study available. Performing Technologist: Darlin Coco RDMS,RVT   Examination Guidelines: A complete evaluation includes B-mode imaging, spectral Doppler, color Doppler, and power Doppler as needed of all accessible portions of each vessel. Bilateral testing is considered an integral part of a complete examination. Limited examinations for reoccurring indications may be performed as noted.  +----------+-------------+----------+-----------+-------+ RIGHT TCD Right VM (cm)Depth (cm)PulsatilityComment +----------+-------------+----------+-----------+-------+ MCA  90.00       5.60      1.03            +----------+-------------+----------+-----------+-------+ ACA           33.00       5.90      1.00            +----------+-------------+----------+-----------+-------+ Term ICA      60.00       6.80      0.87            +----------+-------------+----------+-----------+-------+ PCA           22.00       6.10      0.96            +----------+-------------+----------+-----------+-------+ Opthalmic     14.00       4.30      1.18            +----------+-------------+----------+-----------+-------+ ICA siphon    20.00       5.00      1.17            +----------+-------------+----------+-----------+-------+ Vertebral     30.00       5.90      1.11            +----------+-------------+----------+-----------+-------+  +----------+------------+----------+-----------+-------+ LEFT TCD  Left VM (cm)Depth (cm)PulsatilityComment +----------+------------+----------+-----------+-------+ MCA          66.00       4.40      1.16            +----------+------------+----------+-----------+-------+ ACA          32.00       5.60      1.13            +----------+------------+----------+-----------+-------+ Term ICA     63.00       5.20      0.93            +----------+------------+----------+-----------+-------+ PCA          15.00       5.40      0.53            +----------+------------+----------+-----------+-------+  Opthalmic    16.00       3.90      0.79            +----------+------------+----------+-----------+-------+ ICA siphon   15.00       5.60      0.84            +----------+------------+----------+-----------+-------+ Vertebral    23.00       6.60      0.86            +----------+------------+----------+-----------+-------+  +------------+-------+-------+             VM cm/sComment +------------+-------+-------+ Prox Basilar 25.00         +------------+-------+-------+ Summary:  Isolated mildly elevated right middle cerebral artery mean flow velocities of unclear significance. Normal mean flow velocities in remaining identified vessels of anterior and posterior cerebral circulations. *See table(s) above for TCD measurements and observations.  Diagnosing physician: Antony Contras MD Electronically signed by Antony Contras MD on 06/23/2020 at 11:49:49 AM.    Final    US ABDOMEN LIMITED RUQ (LIVER/GB)  Result Date: 06/13/2020 CLINICAL DATA:  Abdominal pain EXAM: ULTRASOUND ABDOMEN LIMITED RIGHT UPPER QUADRANT COMPARISON:  CT AP 06/12/2020 FINDINGS: Gallbladder: Status post cholecystectomy Common bile duct: Diameter: 3.3 mm Liver: No focal lesion identified. Within  normal limits in parenchymal echogenicity. Portal vein is patent on color Doppler imaging with normal direction of blood flow towards the liver. Other: None. IMPRESSION: 1. Normal exam.  No acute findings. 2. Status post cholecystectomy. Electronically Signed   By: Kerby Moors M.D.   On: 06/13/2020 09:04    Orson Eva, DO  [facility]  If 7PM-7AM, please contact night-coverage [email or web address] Password [email or web address] 06/24/2020, 8:09 AM   LOS: 0 days

## 2020-06-24 NOTE — Progress Notes (Signed)
Initial Nutrition Assessment  DOCUMENTATION CODES:   Not applicable  INTERVENTION:  With diet advancement -ProSource Plus 30 ml po TID, each supplement provides 100 kcal and 15 grams of protein  -Nepro Shake po TID, each supplement provides 425 kcal and 19 grams protein -Rena-vite po daily QHS  Pt at high risk for refeeding with diet advancement as noted below, recommend monitoring magnesium, potassium, and phosphorus daily with diet progression and po intake improves   NUTRITION DIAGNOSIS:   Inadequate oral intake related to poor appetite,vomiting as evidenced by per patient/family report.    GOAL:   Patient will meet greater than or equal to 90% of their needs    MONITOR:   Labs,I & O's,Skin,Diet advancement,Supplement acceptance,PO intake,Weight trends  REASON FOR ASSESSMENT:   Malnutrition Screening Tool    ASSESSMENT:   60 year old male admitted for acute pancreatitis presented with upper abdominal pain and multiple episodes of vomiting. 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  RD working remotely.  Last HD - 3/8 per notes  Spoke with pt via phone this morning, reports ongoing abdominal pain, says his back is itching. Pt reports very poor appetite and has not eaten since Tuesday, recalls bologna sandwich, half of a mushroom swiss burger from [employer], states "it was nasty" and a lemonade. He reports not eating much normally, has to have a "taste for something" and most of the time he eats soups (chicken or vegetable). RD educated on increased needs secondary to ESRD and the importance of adequate protein intake with dialysis. Given history, suspect malnutrition, however unable to identify at this time, will complete exam at follow-up. Pt is agreeable to drinking supplements with diet advancement to help him meet his needs. He is at high risk for  refeeding given reported no po intake in the last few days and overall poor nutrition. Recommend monitoring magnesium, potassium, and phosphorus daily with diet advancement, MD to replete as needed.  I/Os: -126.7 ml since admit UOP: 400 ml since admit EDW 124 kg (per Care Everywhere 05/17/20 [facility]) Question accuracy of current wt. Per chart, weights have been stable 134.3 kg - 136.1 kg from 12/21-2/22. On 06/17/20 he weighed 123.3 kg and currently 90.3 kg. Suspect admit wt entered in error, will use 123.3 kg for estimating needs.   Medications reviewed and include: SSI, Protonix  Labs: CBGs 114,130,157,187, BUN 31 (H), Cr 3.90 (H) A1c 9.2 (H) on 06/13/20  NUTRITION - FOCUSED PHYSICAL EXAM:  Unable to complete at this time  Diet Order:   Diet Order            Diet NPO time specified  Diet effective now                 EDUCATION NEEDS:   Education needs have been addressed  Skin:  Skin Assessment: Reviewed RN Assessment  Last BM:  3/10  Height:   Ht Readings from Last 1 Encounters:  06/23/20 '6\' 3"'$  (1.905 m)    Weight:   Wt Readings from Last 1 Encounters:  06/24/20 90.3 kg    BMI:  Body mass index is 24.88 kg/m.  Estimated Nutritional Needs:   Kcal:  [id number]  Protein:  135-150  Fluid:  UOP + 1000 ml   [name], RD, LDN Clinical Nutrition After Hours/Weekend Pager # in [email or web address]

## 2020-06-24 NOTE — Progress Notes (Signed)
[facility] Admit Date: 06/23/2020 06/24/2020 Reesa Chew Requesting Physician:  Orson Eva  Reason for Consult:  ESRD comanagement HPI:  29M ESRD THS [facility] presents w/ abdominal pain, nausea and vomiting. This is after being admitted and leaving AMA several times recently for pancreatitis. Most recently left AMA on 3/4. We dialyzed him while in the hospital. Now arrives with the same symptoms. Last HD on Thursday.  Has outpt eval of clotted LUE AVG 3/7 with Dr. Donnetta Hutching.    PMH Incudes:  DM  S/dCHF  HTN  R BKA  HD Order, partial: 3K, 25 Ca 400/500, 4h66mn, cont hep with HD, Revaclr, EDW 124kg, epogen  PMH  Past Medical History:  Diagnosis Date  . CHF (congestive heart failure) ([place]   . Diabetes mellitus without complication ([place]   . [place]   . Peripheral edema   . Renal disorder    kidney disease   PSH  Past Surgical History:  Procedure Laterality Date  . AMPUTATION Right 10/21/2019   Procedure: RIGHT BELOW KNEE AMPUTATION;  Surgeon: DNewt Minion MD;  [place]  Service: Orthopedics;  Laterality: Right;  . AV FISTULA PLACEMENT Left 11/13/2019   Procedure: LEFT ARTERIOVENOUS (AV) FISTULA VERSUS ARTERIOVENOUS GRAFT;  Surgeon: ERosetta Posner MD;  [facility];  Service: Vascular;  Laterality: Left;  . AV FISTULA PLACEMENT Left 04/05/2020   Procedure: INSERTION OF LEFT ARM ARTERIOVENOUS (AV) GORE-TEX GRAFT;  Surgeon: ERosetta Posner MD;  [place]  Service: Vascular;  Laterality: Left;  . BASCILIC VEIN TRANSPOSITION Left 01/27/2020   Procedure: LEFT ARM 2ND STAGE BASCILIC VEIN TRANSPOSITION;  Surgeon: ERosetta Posner MD;  [place]  Service: Vascular;  Laterality: Left;  . DIALYSIS/PERMA CATHETER INSERTION N/A 03/03/2020   Procedure: DIALYSIS/PERMA CATHETER INSERTION;  Surgeon: DAlgernon Huxley MD;  [place];  Service: Cardiovascular;  Laterality: N/A;  . ESOPHAGOGASTRODUODENOSCOPY (EGD)  WITH PROPOFOL N/A 06/17/2020   Procedure: ESOPHAGOGASTRODUODENOSCOPY (EGD) WITH PROPOFOL;  Surgeon: CEloise Harman DO;  [facility];  Service: Endoscopy;  Laterality: N/A;  . INSERTION OF DIALYSIS CATHETER N/A 04/05/2020   Procedure: INSERTION OF TUNNEL  DIALYSIS CATHETER LEFT INTERNAL JUGULAR;  Surgeon: ERosetta Posner MD;  [place]  Service: Vascular;  Laterality: N/A;  . IR FLUORO GUIDE CV LINE RIGHT  11/05/2019  . IR FLUORO GUIDE CV LINE RIGHT  06/07/2020  . IR REMOVAL TUN CV CATH W/O FL  06/01/2020  . IR THROMBECTOMY AV FISTULA W/THROMBOLYSIS/PTA INC/SHUNT/IMG LEFT Left 06/06/2020  . IR [place] ACCESS LEFT  06/06/2020  . IR [place] V[place] ACCESS RIGHT  11/05/2019  . IR [place] V[place] ACCESS RIGHT  06/07/2020  . REMOVAL OF A DIALYSIS CATHETER Right 04/05/2020   Procedure: REMOVAL OF RIGHT CHEST DIALYSIS CATHETER;  Surgeon: ERosetta Posner MD;  [place]  Service: Vascular;  Laterality: Right;  . TOE AMPUTATION Bilateral    due to osteomyelitis, all 5 each foot   FH  Family History  Problem Relation Age of Onset  . Cancer Mother        uknown type of cancer  . Colon cancer Neg Hx   . Colon polyps Neg Hx    SH  reports that he has been smoking cigarettes. He has been smoking about 0.50 packs per day. He has never used smokeless tobacco. He reports previous alcohol use. He reports previous drug use. Drug: Cocaine.  Allergies  Allergies  Allergen Reactions  . Bee Venom Anaphylaxis    Per pt, nearly died from bee sting as a child  . Propofol Other (See Comments)     Hallucinations  . Morphine Nausea And Vomiting  . Oxycodone Nausea And Vomiting   Home medications Prior to Admission medications   Medication Sig Start Date End Date Taking? Authorizing Provider  albuterol (VENTOLIN HFA) 108 (90 Base) MCG/ACT inhaler Inhale 1 puff into the lungs every 4 (four) hours as needed for wheezing or shortness of breath. 08/20/19  Yes Gladys Damme, MD  aspirin EC 81  MG EC tablet Take 1 tablet (81 mg total) by mouth daily. 08/21/19  Yes Gladys Damme, MD  ergocalciferol (VITAMIN D2) 1.25 MG (50000 UT) capsule Take 1 capsule (50,000 Units total) by mouth every Friday. 12/11/19  Yes Samella Parr, NP  furosemide (LASIX) 80 MG tablet Take 80 mg by mouth 2 (two) times daily. 05/21/20  Yes [provider]  insulin glargine (LANTUS) 100 UNIT/ML injection Inject 0.35 mLs (35 Units total) into the skin at bedtime. Patient taking differently: Inject 25 Units into the skin at bedtime. 12/11/19  Yes Samella Parr, NP  Insulin Pen Needle (PEN NEEDLES 31GX5/16") 31G X 8 MM MISC 1 each by Other route daily. 12/11/19 12/10/20 Yes Samella Parr, NP  Multiple Vitamin (MULTIVITAMIN WITH MINERALS) TABS tablet Take 1 tablet by mouth daily.   Yes [provider]  NOVOLOG FLEXPEN 100 UNIT/ML FlexPen Inject 10 Units into the skin 3 (three) times daily. 05/24/20  Yes [provider]  pregabalin (LYRICA) 50 MG capsule Take 1 capsule (50 mg total) by mouth 2 (two) times daily. 12/11/19  Yes Samella Parr, NP  sevelamer (RENAGEL) 800 MG tablet Take 1,600 mg by mouth 3 (three) times daily with meals.   Yes [provider]  midodrine (PROAMATINE) 10 MG tablet Take 1 tablet (10 mg total) by mouth 3 (three) times daily with meals. Patient not taking: Reported on 06/16/2020 12/11/19   Samella Parr, NP  phenytoin (DILANTIN) 100 MG ER capsule TAKE 3 CAPSULES(300 MG) BY MOUTH AT BEDTIME 06/16/20   Garvin Fila, MD  phenytoin (DILANTIN) 100 MG ER capsule Take 3 capsules (300 mg total) by mouth at bedtime. 06/15/20   Garvin Fila, MD  torsemide (DEMADEX) 100 MG tablet Take 100 mg by mouth daily. On Monday-Wednesday-Friday-Sunday (non-dialysis days) Patient not taking: Reported on 06/16/2020 04/29/20   [provider]    Current Medications Scheduled Meds: . Chlorhexidine Gluconate Cloth  6 each Topical Daily  . heparin  5,000 Units Subcutaneous  Q8H  . insulin aspart  0-15 Units Subcutaneous Q6H  . pantoprazole (PROTONIX) IV  40 mg Intravenous Q24H   Continuous Infusions:  PRN Meds:.acetaminophen **OR** acetaminophen, HYDROmorphone (DILAUDID) injection, promethazine  CBC Recent Labs  Lab 06/23/20 1200 06/24/20 0616  WBC 6.2 4.8  HGB 12.2* 11.6*  HCT 40.0 38.3*  MCV 86.4 86.7  PLT 292 [id number]   Basic Metabolic Panel Recent Labs  Lab 06/23/20 1200 06/24/20 0616  NA 134* 139  K 3.8 3.6  CL 100 106  CO2 24 24  GLUCOSE 304* 125*  BUN 34* 31*  CREATININE 3.88* 3.90*  CALCIUM 9.0 8.5*    Physical Exam  Blood pressure (!) 150/74, pulse 75, temperature 98.6 F (37 C), temperature source Oral, resp. rate 18, height '6\' 3"'$  (1.905 m), weight 90.3 kg, SpO2 98 %.   Assessment 52M ESRD [facility]  THS RIJ TDC with acute pancreatitis  1. ESRD THS R[facility]  1. HD today then maintain TTS schedule 2. 4h 2K, Tight heparin, 2-3L UF, TDC 2. Acute pancreatitis / Ab pain:mgmt per primary 3. Anemia Hb stable. No therapy needed 4. CKD-BMD: continue binders 5. Nonfunctiona / Clotted AVG LUE: Has VVS appt with [facility] 3/7, might req rescheduling 6. S/d CHF, chornic, moderate volume overload  Plan 1. Plan for dialysis as above 2. We will see formally this weekend or on Monday if the patient remains in the hospital   Reesa Chew  06/24/2020, 7:31 PM

## 2020-06-25 DIAGNOSIS — N186 End stage renal disease: Secondary | ICD-10-CM | POA: Diagnosis not present

## 2020-06-25 DIAGNOSIS — K859 Acute pancreatitis without necrosis or infection, unspecified: Secondary | ICD-10-CM | POA: Diagnosis not present

## 2020-06-25 DIAGNOSIS — I10 Essential (primary) hypertension: Secondary | ICD-10-CM | POA: Diagnosis not present

## 2020-06-25 LAB — COMPREHENSIVE METABOLIC PANEL
ALT: 15 U/L (ref 0–44)
AST: 17 U/L (ref 15–41)
Albumin: 2.5 g/dL — ABNORMAL LOW (ref 3.5–5.0)
Alkaline Phosphatase: 181 U/L — ABNORMAL HIGH (ref 38–126)
Anion gap: 10 (ref 5–15)
BUN: 30 mg/dL — ABNORMAL HIGH (ref 6–20)
CO2: 23 mmol/L (ref 22–32)
Calcium: 8.5 mg/dL — ABNORMAL LOW (ref 8.9–10.3)
Chloride: 107 mmol/L (ref 98–111)
Creatinine, Ser: 4.01 mg/dL — ABNORMAL HIGH (ref 0.61–1.24)
GFR, Estimated: 17 mL/min — ABNORMAL LOW (ref >60)
Glucose, Bld: 100 mg/dL — ABNORMAL HIGH (ref 70–99)
Potassium: 3.7 mmol/L (ref 3.5–5.1)
Sodium: 140 mmol/L (ref 135–145)
Total Bilirubin: 0.7 mg/dL (ref 0.3–1.2)
Total Protein: 6.5 g/dL (ref 6.5–8.1)

## 2020-06-25 LAB — GLUCOSE, CAPILLARY
Glucose-Capillary: 105 mg/dL — ABNORMAL HIGH (ref 70–99)
Glucose-Capillary: 110 mg/dL — ABNORMAL HIGH (ref 70–99)
Glucose-Capillary: 126 mg/dL — ABNORMAL HIGH (ref 70–99)
Glucose-Capillary: 66 mg/dL — ABNORMAL LOW (ref 70–99)
Glucose-Capillary: 74 mg/dL (ref 70–99)
Glucose-Capillary: 86 mg/dL (ref 70–99)
Glucose-Capillary: 87 mg/dL (ref 70–99)

## 2020-06-25 LAB — LIPASE, BLOOD: Lipase: 42 U/L (ref 11–51)

## 2020-06-25 MED ORDER — SODIUM CHLORIDE 0.9 % IV SOLN: 100.0000 mL | INTRAVENOUS | Status: DC | PRN

## 2020-06-25 MED ORDER — PHENYTOIN SODIUM EXTENDED 100 MG PO CAPS
300.0000 mg | ORAL_CAPSULE | Freq: Every day | ORAL | Status: DC
  Administered 2020-06-25 – 2020-06-27 (×3): 300 mg via ORAL
  Filled 2020-06-25 (×3): qty 3

## 2020-06-25 MED ORDER — HEPARIN SODIUM (PORCINE) 1000 UNIT/ML DIALYSIS
1000.0000 [IU] | INTRAMUSCULAR | Status: DC | PRN
  Administered 2020-06-28: 1000 [IU] via INTRAVENOUS_CENTRAL

## 2020-06-25 MED ORDER — DEXTROSE 50 % IV SOLN
25.0000 mL | Freq: Once | INTRAVENOUS | Status: AC
  Administered 2020-06-25: 25 mL via INTRAVENOUS
  Filled 2020-06-25: qty 50

## 2020-06-25 MED ORDER — INSULIN ASPART 100 UNIT/ML ~~LOC~~ SOLN
0.0000 [IU] | Freq: Three times a day (TID) | SUBCUTANEOUS | Status: DC
  Administered 2020-06-26 – 2020-06-27 (×3): 2 [IU] via SUBCUTANEOUS
  Administered 2020-06-28 (×2): 1 [IU] via SUBCUTANEOUS

## 2020-06-25 MED ORDER — DEXTROSE-NACL 5-0.9 % IV SOLN: INTRAVENOUS | Status: AC

## 2020-06-25 MED ORDER — ALTEPLASE 2 MG IJ SOLR
2.0000 mg | Freq: Once | INTRAMUSCULAR | Status: AC | PRN
  Administered 2020-06-25: 4 mg

## 2020-06-25 NOTE — Progress Notes (Signed)
Patient given IV pain medication.  Once I was completed patient stated, "You need to flush it".  I explained to patient that he had IVF running at 50 ml/hr, which would flush the tubing.  Patient then stated, "It's not doing that because I don't feel it".  I explained to patient that I would NOT be flushing pain medication through his IV so that he could "feel it".

## 2020-06-25 NOTE — Progress Notes (Signed)
PROGRESS NOTE  Zachary Young NOI:[id number] DOB: [birth year] DOA: 06/23/2020 PCP: Zachary Kohut, DO    Brief History:  60 year old male with history of ESRD (TTS), diabetes mellitus type 2, hyperlipidemia, coronary artery disease, osteomyelitis status post right BKA, and tobacco abuse presenting with 1 day history of nausea, vomiting, and abdominal pain.  He denies any fevers, chills, chest pain, shortness breath, hemoptysis, hematemesis, hematochezia, melena.  Notably, the patient had 2 recent hospital admissions for acute pancreatitis for which she left AGAINST MEDICAL ADVICE.  He was admitted from 06/16/2020 to 06/17/2020 with elevated lipase up to 1092.  He underwent EGD which showed gastritis.  Right upper quadrant ultrasound of admission was negative for any acute findings.  It was felt the patient had idiopathic pancreatitis and his hematemesis was likely due to Mallory-Weiss tear.  In addition, the patient also had an admission from 06/12/2020 to 06/13/2020 for acute pancreatitis for which he left AGAINST MEDICAL ADVICE. He denies any alcohol or illicit drug use or any new medications.  He complains of some loose stools without any hematochezia or melena.  He denies any new medications although he has very poor insight regarding his medications. In the emergency department, the patient was afebrile hemodynamically stable with oxygen saturation 98% on room air.  LFTs were unremarkable, but lipase was 210.  BMP showed a sodium 134, potassium 3.8, bicarbonate 24, serum creatinine 3.88.  WBC 6.2, hemoglobin 12.2, platelets 292,000. Nephrology was consulted for maintenance dialysis.  The patient was admitted for further evaluation of his acute pancreatitis.  Assessment/Plan: Acute idiopathic pancreatitis -Triglycerides 179 -06/13/2020 right upper quadrant ultrasound negative for acute findings -lipase 210>>42 -advance to clears -Judicious IV fluids -Judicious opioids -Obtain urine drug  screen+opiates only -In review of his medication list, certainly furosemide may be partly contributory -3010 CT abdomen--subtle stable pancreatic edema without pancreatic fluid collection or abscess.  Mild edema in the abdomen and pelvic walls suspecting anasarca; bilateral lower lobe bronchiectasis  Uncontrolled diabetes mellitus type 2 with hyperglycemia -Reduced dose Lantus>>d/c due to low sugars -NovoLog sliding scale--de-escalate to sensitive scale -06/05/2020 hemoglobin A1c 9.2  ESRD -Last dialyzed on 06/21/2020 -Nephrology consulted for maintenance dialysis  Mixed hyperlipidemia -LDL 148 -Triglycerides 179 -Total cholesterol 223 -Start statin  Chronic systolic and diastolic CHF -The patient does appear a bit hypervolemic, but suspect this is related to his ESRD and missing dialysis -52/21 echo EF 50-55%, no WMA, trivial TR  Bronchiectasis/tobacco abuse -No wheezing on exam -Tobacco cessation discussed -Stable on room air -Personally reviewed chest x-ray--increased interstitial markings  Right BKA status -PT evaluation once stable     Status is: Observation  The patient will require care spanning > 2 midnights and should be moved to inpatient because: IV treatments appropriate due to intensity of illness or inability to take PO  Dispo: The patient is from: Home  Anticipated d/c is to: Home  Patient currently is not medically stable to d/c.              Difficult to place patient No        Family Communication:   No Family at bedside  Consultants:  renal  Code Status:  FULL  DVT Prophylaxis:  [place] Heparin    Procedures: As Listed in Progress Note Above  Antibiotics: None    Subjective: Patient state abd pain is a little better.  Denies cp, sob, n/v/d, dysuria.  Objective: Vitals:   06/25/20 1530 06/25/20 1600 06/25/20  1630 06/25/20 1700  BP: (!) 177/97 (!) 175/96 (!) 180/95 (!) 197/96  Pulse:  94 88 87 85  Resp:      Temp:      TempSrc:      SpO2:      Weight:      Height:        Intake/Output Summary (Last 24 hours) at 06/25/2020 1707 Last data filed at 06/25/2020 1700 Gross per 24 hour  Intake 319.9 ml  Output 2000 ml  Net -1680.1 ml   Weight change:  Exam:   General:  Pt is alert, follows commands appropriately, not in acute distress  HEENT: No icterus, No thrush, No neck mass, [place]/AT  Cardiovascular: RRR, S1/S2, no rubs, no gallops  Respiratory: diminished BS. No wheeze  Abdomen: Soft/+BS, periumbilical tender, non distended, no guarding  Extremities: No edema, No lymphangitis, No petechiae, No rashes, no synovitis   Data Reviewed: I have personally reviewed following labs and imaging studies Basic Metabolic Panel: Recent Labs  Lab 06/23/20 1200 06/24/20 0616 06/25/20 0613  NA 134* 139 140  K 3.8 3.6 3.7  CL 100 106 107  CO2 24 24 23   GLUCOSE 304* 125* 100*  BUN 34* 31* 30*  CREATININE 3.88* 3.90* 4.01*  CALCIUM 9.0 8.5* 8.5*  MG 1.9  --   --    Liver Function Tests: Recent Labs  Lab 06/23/20 1200 06/25/20 0613  AST 11* 17  ALT 9 15  ALKPHOS 165* 181*  BILITOT 0.4 0.7  PROT 7.2 6.5  ALBUMIN 2.9* 2.5*   Recent Labs  Lab 06/23/20 1200 06/25/20 0613  LIPASE 210* 42   No results for input(s): AMMONIA in the last 168 hours. Coagulation Profile: No results for input(s): INR, PROTIME in the last 168 hours. CBC: Recent Labs  Lab 06/23/20 1200 06/24/20 0616  WBC 6.2 4.8  HGB 12.2* 11.6*  HCT 40.0 38.3*  MCV 86.4 86.7  PLT 292 260   Cardiac Enzymes: No results for input(s): CKTOTAL, CKMB, CKMBINDEX, TROPONINI in the last 168 hours. BNP: Invalid input(s): POCBNP CBG: Recent Labs  Lab 06/25/20 0438 06/25/20 0530 06/25/20 0738 06/25/20 1124 06/25/20 1614  GLUCAP 66* 105* 86 87 110*   HbA1C: No results for input(s): HGBA1C in the last 72 hours. Urine analysis:    Component Value Date/Time   COLORURINE STRAW (A)  06/23/2020 1519   APPEARANCEUR CLEAR 06/23/2020 1519   LABSPEC 1.015 06/23/2020 1519   PHURINE 7.0 06/23/2020 1519   GLUCOSEU >=500 (A) 06/23/2020 1519   HGBUR SMALL (A) 06/23/2020 1519   BILIRUBINUR NEGATIVE 06/23/2020 1519   KETONESUR NEGATIVE 06/23/2020 1519   PROTEINUR >=300 (A) 06/23/2020 1519   NITRITE NEGATIVE 06/23/2020 1519   LEUKOCYTESUR NEGATIVE 06/23/2020 1519   Sepsis Labs: @LABRCNTIP (procalcitonin:4,lacticidven:4) ) Recent Results (from the past 240 hour(s))  SARS CORONAVIRUS 2 ([place] 6-24 HRS) Nasopharyngeal Nasopharyngeal Swab     Status: None   Collection Time: 06/16/20 11:53 AM   Specimen: Nasopharyngeal Swab  Result Value Ref Range Status   SARS Coronavirus 2 NEGATIVE NEGATIVE Final    Comment: (NOTE) SARS-CoV-2 target nucleic acids are NOT DETECTED.  The SARS-CoV-2 RNA is generally detectable in upper and lower respiratory specimens during the acute phase of infection. Negative results do not preclude SARS-CoV-2 infection, do not rule out co-infections with other pathogens, and should not be used as the sole basis for treatment or other patient management decisions. Negative results must be combined with clinical observations, patient history, and epidemiological information. The  expected result is Negative.  Fact Sheet for Patients: SugarRoll.be  Fact Sheet for Healthcare Providers: https://www.woods-mathews.com/  This test is not yet approved or cleared by the [place] FDA and  has been authorized for detection and/or diagnosis of SARS-CoV-2 by FDA under an Emergency Use Authorization (EUA). This EUA will remain  in effect (meaning this test can be used) for the duration of the COVID-19 declaration under Se ction 564(b)(1) of the Act, 21 U.S.C. section 360bbb-3(b)(1), unless the authorization is terminated or revoked sooner.  Performed at [facility], [place] [address], [place], [place] [address]    Resp Panel by RT-PCR (Flu A&B, Covid) Nasopharyngeal Swab     Status: None   Collection Time: 06/23/20  3:19 PM   Specimen: Nasopharyngeal Swab; Nasopharyngeal(NP) swabs in vial transport medium  Result Value Ref Range Status   SARS Coronavirus 2 by RT PCR NEGATIVE NEGATIVE Final    Comment: (NOTE) SARS-CoV-2 target nucleic acids are NOT DETECTED.  The SARS-CoV-2 RNA is generally detectable in upper respiratory specimens during the acute phase of infection. The lowest concentration of SARS-CoV-2 viral copies this assay can detect is 138 copies/mL. A negative result does not preclude SARS-Cov-2 infection and should not be used as the sole basis for treatment or other patient management decisions. A negative result may occur with  improper specimen collection/handling, submission of specimen other than nasopharyngeal swab, presence of viral mutation(s) within the areas targeted by this assay, and inadequate number of viral copies(<138 copies/mL). A negative result must be combined with clinical observations, patient history, and epidemiological information. The expected result is Negative.  Fact Sheet for Patients:  EntrepreneurPulse.com.au  Fact Sheet for Healthcare Providers:  IncredibleEmployment.be  This test is no t yet approved or cleared by the [place] FDA and  has been authorized for detection and/or diagnosis of SARS-CoV-2 by FDA under an Emergency Use Authorization (EUA). This EUA will remain  in effect (meaning this test can be used) for the duration of the COVID-19 declaration under Section 564(b)(1) of the Act, 21 U.S.C.section 360bbb-3(b)(1), unless the authorization is terminated  or revoked sooner.       Influenza A by PCR NEGATIVE NEGATIVE Final   Influenza B by PCR NEGATIVE NEGATIVE Final    Comment: (NOTE) The Xpert Xpress SARS-CoV-2/FLU/RSV plus assay is intended as an aid in the diagnosis of influenza from  Nasopharyngeal swab specimens and should not be used as a sole basis for treatment. Nasal washings and aspirates are unacceptable for Xpert Xpress SARS-CoV-2/FLU/RSV testing.  Fact Sheet for Patients: EntrepreneurPulse.com.au  Fact Sheet for Healthcare Providers: IncredibleEmployment.be  This test is not yet approved or cleared by the [place] FDA and has been authorized for detection and/or diagnosis of SARS-CoV-2 by FDA under an Emergency Use Authorization (EUA). This EUA will remain in effect (meaning this test can be used) for the duration of the COVID-19 declaration under Section 564(b)(1) of the Act, 21 U.S.C. section 360bbb-3(b)(1), unless the authorization is terminated or revoked.  Performed at [facility], [address], [place], [place] [address]      Scheduled Meds: . Chlorhexidine Gluconate Cloth  6 each Topical Daily  . Chlorhexidine Gluconate Cloth  6 each Topical Q0600  . heparin  5,000 Units Subcutaneous Q8H  . [START ON 06/26/2020] insulin aspart  0-6 Units Subcutaneous TID WC  . pantoprazole (PROTONIX) IV  40 mg Intravenous Q24H   Continuous Infusions: . sodium chloride    . sodium chloride    .  dextrose 5 % and 0.9% NaCl 50 mL/hr at 06/25/20 0511    Procedures/Studies: CT ABDOMEN PELVIS WO CONTRAST  Result Date: 06/23/2020 CLINICAL DATA:  Abdominal pain.  History of recent pancreatitis EXAM: CT ABDOMEN AND PELVIS WITHOUT CONTRAST TECHNIQUE: Multidetector CT imaging of the abdomen and pelvis was performed following the standard protocol without oral or IV contrast. COMPARISON:  June 12, 2020 FINDINGS: Lower chest: There is bibasilar atelectasis. There is mild lower lobe bronchiectatic change. There is no lung base edema or consolidation. Hepatobiliary: No focal liver lesions are appreciable on this noncontrast enhanced study. Gallbladder is absent. There is no appreciable biliary duct dilatation. Pancreas: Subtle  edema of the pancreas appears stable compared to recent study. There is no pancreatic duct dilatation or pancreatic mass. There is no peripancreatic fluid or soft tissue stranding in the peripancreatic region. The appearance of the pancreas is stable compared to the recent prior study. Spleen: No splenic lesions are evident. Adrenals/Urinary Tract: Adrenals bilaterally appear normal. No evident renal mass or hydronephrosis on either side. There is no appreciable renal or ureteral calculus on either side. Urinary bladder is midline with wall thickness within normal limits. Stomach/Bowel: There is no appreciable bowel wall or mesenteric thickening. No appreciable bowel obstruction. The terminal ileum appears normal. There is no evident free air or portal venous air. Vascular/Lymphatic: No abdominal aortic aneurysm. There is aortic and iliac artery atherosclerotic calcification. Prominent left inguinal lymph nodes noted. The largest of these lymph nodes in the left inguinal region has a short axis diameter of 1.4 cm. There is also a prominent lymph node in the right inguinal region with a short axis diameter of 1.3 cm. No adenopathy evident elsewhere in the abdomen or pelvis. Reproductive: There are prostatic calculi. Prostate and seminal vesicles are normal in size and contour. Other: The appendix appears normal. No evident abscess or ascites in the abdomen or pelvis. There is fat in the right inguinal ring. Musculoskeletal: No blastic or lytic bone lesions. No intramuscular lesions. A mild degree of edema is noted in the abdominal and pelvic wall regions. IMPRESSION: 1. Stable subtle pancreatic edema, likely due to a degree of developing pancreatitis. No pancreatic fluid or mass. No pancreatic duct dilatation. No new abnormality in the pancreas/peripancreatic region. 2. No bowel wall or mesenteric thickening. No bowel obstruction. No abscess in the abdomen or pelvis. Appendix appears normal. 3.  Gallbladder absent.  4. Mild inguinal adenopathy of uncertain etiology. Adenopathy of this nature potentially could have reactive etiology. 5. Mild edema in the abdominal and pelvic wall regions without associated fluid. Clinical significance of this finding uncertain. This finding may be due to early developing anasarca secondary to chronic renal failure. 6.  Mild lower lobe bronchiectatic change bilaterally. 7.  Aortic Atherosclerosis (ICD10-I70.0). Electronically Signed   By: Lowella Grip III M.D.   On: 06/23/2020 14:50   CT ABDOMEN PELVIS WO CONTRAST  Result Date: 06/12/2020 CLINICAL DATA:  Acute nonlocalized abdominal pain. Vomiting. Renal failure. Dialysis patient. EXAM: CT ABDOMEN AND PELVIS WITHOUT CONTRAST TECHNIQUE: Multidetector CT imaging of the abdomen and pelvis was performed following the standard protocol without IV contrast. COMPARISON:  12/14/2018 FINDINGS: Lower chest: Interstitial and early alveolar pattern in the lower lungs consistent with heart failure or fluid overload. No consolidation or collapse. No pleural effusion. Hepatobiliary: Liver parenchyma appears normal without contrast. Previous cholecystectomy. Pancreas: There appears to be mild swelling with surrounding edema in the region of the body of the pancreas suggesting early or mild pancreatitis.  Spleen: Normal Adrenals/Urinary Tract: Adrenal glands are normal. Kidneys are normal. Bladder is normal. Stomach/Bowel: Stomach and small intestine are normal. Normal appendix. No colon abnormality seen. Vascular/Lymphatic: Aortic atherosclerosis. No aneurysm. IVC is normal. No adenopathy. Reproductive: Normal Other: No free fluid or air. Musculoskeletal: Right inguinal hernia containing fat. Mild lower lumbar degenerative changes. IMPRESSION: Lung bases show interstitial and early alveolar edema consistent with fluid overload. Mild swelling of the body of the pancreas with mild surrounding edema consistent with early/mild pancreatitis. Electronically  Signed   By: Nelson Chimes M.D.   On: 06/12/2020 21:56   DG Chest 2 View  Result Date: 06/23/2020 CLINICAL DATA:  Epigastric pain. EXAM: CHEST - 2 VIEW COMPARISON:  04/25/2020 FINDINGS: Cardiopericardial silhouette is at upper limits of normal for size. Vascular congestion noted with diffuse interstitial opacities suggesting edema. No substantial pleural effusion. Right-sided IJ central line tip overlies the SVC/RA junction. Telemetry leads overlie the chest. IMPRESSION: Diffuse interstitial lung opacity suggests edema. Electronically Signed   By: Misty Stanley M.D.   On: 06/23/2020 14:22   IR Removal Tun Cv Cath W/O FL  Result Date: 06/01/2020 INDICATION: Patient with history of end-stage renal disease on dialysis via tunneled HD catheter placed 11/06/2019 by Dr. Earleen Newport. He now has a working AV graft and is no longer in need of his tunneled dialysis catheter. Request is made for removal. EXAM: REMOVAL TUNNELED CENTRAL VENOUS CATHETER MEDICATIONS: 10 mL 1% lidocaine ANESTHESIA/SEDATION: None FLUOROSCOPY TIME:  None COMPLICATIONS: None immediate. PROCEDURE: Informed written consent was obtained from the patient after a thorough discussion of the procedural risks, benefits and alternatives. All questions were addressed. Maximal Sterile Barrier Technique was utilized including caps, mask, sterile gowns, sterile gloves, sterile drape, hand hygiene and skin antiseptic. A timeout was performed prior to the initiation of the procedure. The patient's right chest and catheter was prepped and draped in a normal sterile fashion. Heparin was removed from both ports of catheter. 1% lidocaine was used for local anesthesia. Using gentle blunt dissection the cuff of the catheter was exposed and the catheter was removed in its entirety. Pressure was held till hemostasis was obtained. A sterile dressing was applied. The patient tolerated the procedure well with no immediate complications. IMPRESSION: Successful catheter  removal as described above. Read by: Brynda Greathouse PA-C Electronically Signed   By: Jacqulynn Cadet M.D.   On: 06/01/2020 15:09   IR US Guide Vasc Access Left  Result Date: 06/06/2020 INDICATION: 60 year old male with left axillary to axillary loop arteriovenous hemodialysis graft created 04/05/2020 presenting with graft thrombosis. EXAM: 1. Ultrasound-guided arteriovenous graft access x2 2. Pharmacomechanical thrombolysis of arteriovenous graft 3. Rheolytic thrombectomy of arteriovenous graft 4. Balloon sweep thrombectomy of arterial anastomosis 5. Balloon angioplasty of arteriovenous graft COMPARISON:  None. MEDICATIONS: None. CONTRAST:  91m OMNIPAQUE IOHEXOL 300 MG/ML  SOLN ANESTHESIA/SEDATION: Moderate (conscious) sedation was employed during this procedure. A total of Versed 1.5 mg and Fentanyl 75 mcg was administered intravenously. Moderate Sedation Time: 99 minutes. The patient's level of consciousness and vital signs were monitored continuously by radiology nursing throughout the procedure under my direct supervision. FLUOROSCOPY TIME:  19 minutes, 18 seconds, 81 mGy COMPLICATIONS: None immediate. PROCEDURE: Informed written consent was obtained from the patient after a discussion of the risk, benefits and alternatives to treatment. Questions regarding the procedure were encouraged and answered. A timeout was performed prior to the initiation of the procedure. The left arm dialysis graft was prepped with Chlorhexidine in a sterile fashion, and a  sterile drape was applied covering the operative field. Ultrasound evaluation of the entire graft was performed. Antegrade access near the graft apex was performed under ultrasound guidance with a micropuncture set. Over a Wholey wire, a 6 French sheath was placed. A Glidewire and angled catheter were directed to the left subclavian vein. Pull-back venogram was performed. Pharmacomechanical thrombolysis was performed of the venous outflow portion of the  graft. Retrograde access was then obtained under ultrasound guidance with a micropuncture set. Over a Wholey wire, a 6 French sheath was placed. A Glidewire and angled catheter were directed to the axillary artery. Pharmacomechanical thrombolysis was performed in the arterial limb of the graft. Using a Fogarty balloon, balloon sweep was performed of the arterial anastomosis 3 times. Rheolytic thrombectomy was then performed throughout the arterial limb of the graft. This was followed by rheolytic therapy throughout the venous aspect of the graft. Fistulagram from the axillary artery demonstrated severe focal stenosis near the arterial anastomosis. Balloon angioplasty was performed at the stenotic site with a 4 mm x 2 cm mustang balloon. There is improved patency inflow after angioplasty. Additional moderate stenosis was noted in the graft apex. Balloon angioplasty with a 7 mm x 4 cm mustang balloon was performed. There are multifocal stenoses near the axillary vein anastomosis. Balloon angioplasty was performed multiple sites. Completion fistulagram was then performed. The sheaths was removed and hemostasis obtained with application of a 2-0 Ethilon pursestring suture which will be removed at the patient's next dialysis session. Dressings were placed. The patient tolerated the procedure well without immediate postprocedural complication. FINDINGS: Complete thrombosis of the left axillary to axillary loop arteriovenous graft. Technically successful pharmacomechanical thrombolysis and rheolytic thrombectomy throughout the graft. High-grade focal stenosis at the graft arterial anastomosis which improved after angioplasty. Additional angioplasty was required at the graft apex as well as the venous anastomosis. Self-limiting axillary hematoma was observed after wire cannulation cross the venous anastomosis. There is complete cessation of extravasation after balloon tamponade. Upon completion, there was palpable thrill  throughout the graft and brisk antegrade flow on completion fistulogram. IMPRESSION: 1. Thrombosed left axillary to axillary loop arteriovenous graft. 2. Technically successful declot of graft with pharmacomechanical thrombolysis, rheolytic thrombectomy, Fogarty arterial anastomosis balloon sweep, and balloon angioplasty. ACCESS: This access remains amenable to future percutaneous interventions as clinically indicated. Ruthann Cancer, MD [facility] Electronically Signed   By: Ruthann Cancer MD   On: 06/06/2020 17:42   IR US Guide Vasc Access Right  Result Date: 06/07/2020 INDICATION: History of end-stage renal disease post attempt at left upper arm declot (performed yesterday 06/06/2020) with early rethrombosis. Patient presents today for image guided placement of a tunneled dialysis catheter. EXAM: TUNNELED CENTRAL VENOUS HEMODIALYSIS CATHETER PLACEMENT WITH ULTRASOUND AND FLUOROSCOPIC GUIDANCE MEDICATIONS: Ancef 2 gm IV . The antibiotic was given in an appropriate time interval prior to skin puncture. ANESTHESIA/SEDATION: Moderate (conscious) sedation was employed during this procedure. A total of Versed 0.5 mg and Fentanyl 25 mcg was administered intravenously. Moderate Sedation Time: 17 minutes. The patient's level of consciousness and vital signs were monitored continuously by radiology nursing throughout the procedure under my direct supervision. Comparisons: Image guided left upper arm dialysis graft thrombolysis-06/06/2020 FLUOROSCOPY TIME:  30 seconds (16 mGy) COMPLICATIONS: None immediate. PROCEDURE: Informed written consent was obtained from the patient after a discussion of the risks, benefits, and alternatives to treatment. Questions regarding the procedure were encouraged and answered. The right neck and chest were prepped with chlorhexidine in  a sterile fashion, and a sterile drape was applied covering the operative field. Maximum barrier  sterile technique with sterile gowns and gloves were used for the procedure. A timeout was performed prior to the initiation of the procedure. After creating a small venotomy incision, a micropuncture kit was utilized to access the internal jugular vein. Real-time ultrasound guidance was utilized for vascular access including the acquisition of a permanent ultrasound image documenting patency of the accessed vessel. The microwire was utilized to measure appropriate catheter length. A stiff Glidewire was advanced to the level of the IVC and the micropuncture sheath was exchanged for a peel-away sheath. A palindrome tunneled hemodialysis catheter measuring 19 cm from tip to cuff was tunneled in a retrograde fashion from the anterior chest wall to the venotomy incision. The catheter was then placed through the peel-away sheath with tips ultimately positioned within the superior aspect of the right atrium. Final catheter positioning was confirmed and documented with a spot radiographic image. The catheter aspirates and flushes normally. The catheter was flushed with appropriate volume heparin dwells. The catheter exit site was secured with a 0-Prolene retention suture. The venotomy incision was closed with Dermabond and Steri-strips. Dressings were applied. The patient tolerated the procedure well without immediate post procedural complication. IMPRESSION: Successful placement of 19 cm tip to cuff tunneled hemodialysis catheter via the right internal jugular vein with tips terminating within the superior aspect of the right atrium. The catheter is ready for immediate use. Electronically Signed   By: Sandi Mariscal M.D.   On: 06/07/2020 14:04   VAS US CAROTID  Result Date: 06/23/2020 Carotid Arterial Duplex Study Indications:       Convulsive syncope. Limitations        Today's exam was limited due to the high bifurcation of the                    carotid. Comparison Study:  TCD doppler study 06-22-2020 available.  Performing Technologist: Darlin Coco RDMS,RVT  Examination Guidelines: A complete evaluation includes B-mode imaging, spectral Doppler, color Doppler, and power Doppler as needed of all accessible portions of each vessel. Bilateral testing is considered an integral part of a complete examination. Limited examinations for reoccurring indications may be performed as noted.  Right Carotid Findings: +----------+--------+--------+--------+------------------+--------+           PSV cm/sEDV cm/sStenosisPlaque DescriptionComments +----------+--------+--------+--------+------------------+--------+ CCA Prox  91      12                                         +----------+--------+--------+--------+------------------+--------+ CCA Distal68      19              heterogenous               +----------+--------+--------+--------+------------------+--------+ ICA Prox  58      17      1-39%   heterogenous               +----------+--------+--------+--------+------------------+--------+ ICA Distal84      28                                         +----------+--------+--------+--------+------------------+--------+ ECA       106     16                                         +----------+--------+--------+--------+------------------+--------+ +----------+--------+-------+----------------+-------------------+  PSV cm/sEDV cmsDescribe        Arm Pressure (mmHG) +----------+--------+-------+----------------+-------------------+ [id number]            Multiphasic, WNL                    +----------+--------+-------+----------------+-------------------+ +---------+--------+--+--------+--+---------+ VertebralPSV cm/s43EDV cm/s13Antegrade +---------+--------+--+--------+--+---------+  Left Carotid Findings: +----------+--------+--------+--------+------------------+--------+           PSV cm/sEDV cm/sStenosisPlaque DescriptionComments  +----------+--------+--------+--------+------------------+--------+ CCA Prox  114     22                                         +----------+--------+--------+--------+------------------+--------+ CCA Distal76      18              heterogenous               +----------+--------+--------+--------+------------------+--------+ ICA Prox  50      17      1-39%   heterogenous               +----------+--------+--------+--------+------------------+--------+ ICA Distal83      33                                         +----------+--------+--------+--------+------------------+--------+ ECA       79      16                                         +----------+--------+--------+--------+------------------+--------+ +----------+--------+--------+----------------+-------------------+           PSV cm/sEDV cm/sDescribe        Arm Pressure (mmHG) +----------+--------+--------+----------------+-------------------+ [id number]             Multiphasic, WNL                    +----------+--------+--------+----------------+-------------------+ +---------+--------+--+--------+--+---------+ VertebralPSV cm/s49EDV cm/s15Antegrade +---------+--------+--+--------+--+---------+   Summary: Right Carotid: Velocities in the right ICA are consistent with a 1-39% stenosis. Left Carotid: Velocities in the left ICA are consistent with a 1-39% stenosis. Vertebrals:  Bilateral vertebral arteries demonstrate antegrade flow. Subclavians: Normal flow hemodynamics were seen in bilateral subclavian              arteries. *See table(s) above for measurements and observations.  Electronically signed by Antony Contras MD on 06/23/2020 at 11:42:46 AM.    Final    VAS [place] TRANSCRANIAL DOPPLER  Result Date: 06/23/2020  Transcranial Doppler Indications: Convulsive syncope. Comparison Study: 06-22-2020 Carotid duplex study available. Performing Technologist: Darlin Coco RDMS,RVT  Examination Guidelines: A  complete evaluation includes B-mode imaging, spectral Doppler, color Doppler, and power Doppler as needed of all accessible portions of each vessel. Bilateral testing is considered an integral part of a complete examination. Limited examinations for reoccurring indications may be performed as noted.  +----------+-------------+----------+-----------+-------+ RIGHT TCD Right VM (cm)Depth (cm)PulsatilityComment +----------+-------------+----------+-----------+-------+ MCA           90.00       5.60      1.03            +----------+-------------+----------+-----------+-------+ ACA           33.00       5.90      1.00            +----------+-------------+----------+-----------+-------+  Term ICA      60.00       6.80      0.87            +----------+-------------+----------+-----------+-------+ PCA           22.00       6.10      0.96            +----------+-------------+----------+-----------+-------+ Opthalmic     14.00       4.30      1.18            +----------+-------------+----------+-----------+-------+ ICA siphon    20.00       5.00      1.17            +----------+-------------+----------+-----------+-------+ Vertebral     30.00       5.90      1.11            +----------+-------------+----------+-----------+-------+  +----------+------------+----------+-----------+-------+ LEFT TCD  Left VM (cm)Depth (cm)PulsatilityComment +----------+------------+----------+-----------+-------+ MCA          66.00       4.40      1.16            +----------+------------+----------+-----------+-------+ ACA          32.00       5.60      1.13            +----------+------------+----------+-----------+-------+ Term ICA     63.00       5.20      0.93            +----------+------------+----------+-----------+-------+ PCA          15.00       5.40      0.53            +----------+------------+----------+-----------+-------+ Opthalmic    16.00       3.90       0.79            +----------+------------+----------+-----------+-------+ ICA siphon   15.00       5.60      0.84            +----------+------------+----------+-----------+-------+ Vertebral    23.00       6.60      0.86            +----------+------------+----------+-----------+-------+  +------------+-------+-------+             VM cm/sComment +------------+-------+-------+ Prox Basilar 25.00         +------------+-------+-------+ Summary:  Isolated mildly elevated right middle cerebral artery mean flow velocities of unclear significance. Normal mean flow velocities in remaining identified vessels of anterior and posterior cerebral circulations. *See table(s) above for TCD measurements and observations.  Diagnosing physician: Antony Contras MD Electronically signed by Antony Contras MD on 06/23/2020 at 11:49:49 AM.    Final    US ABDOMEN LIMITED RUQ (LIVER/GB)  Result Date: 06/13/2020 CLINICAL DATA:  Abdominal pain EXAM: ULTRASOUND ABDOMEN LIMITED RIGHT UPPER QUADRANT COMPARISON:  CT AP 06/12/2020 FINDINGS: Gallbladder: Status post cholecystectomy Common bile duct: Diameter: 3.3 mm Liver: No focal lesion identified. Within normal limits in parenchymal echogenicity. Portal vein is patent on color Doppler imaging with normal direction of blood flow towards the liver. Other: None. IMPRESSION: 1. Normal exam.  No acute findings. 2. Status post cholecystectomy. Electronically Signed   By: Kerby Moors M.D.   On: 06/13/2020 09:04    Orson Eva, DO  [facility]  If 7PM-7AM, please  contact night-coverage [email or web address] Password [email or web address] 06/25/2020, 5:07 PM   LOS: 1 day

## 2020-06-25 NOTE — Procedures (Signed)
   HEMODIALYSIS TREATMENT NOTE:  Catheter was occluded pre-HD; Activase instilled for 60 minutes.  3.5 hour session completed. 2L removed.  Hypertensive throughout session - post HD BP 197/96 - reported to Dr. Carles Collet.  Clear liquid diet ordered. Tolerated chicken broth without N/V.  Looking forward to solid foods.  Zachary Alexandria, RN

## 2020-06-25 NOTE — Progress Notes (Signed)
Hypoglycemic Event  CBG: 66   Treatment: D50 25 mL (12.5 gm)  Symptoms: None  Follow-up CBG: Time:0530 CBG Result:105  Possible Reasons for Event: Inadequate meal intake  Comments/MD notified:MD notified. D5NS @ 50/hr ordered and started    [employer]

## 2020-06-26 DIAGNOSIS — Z794 Long term (current) use of insulin: Secondary | ICD-10-CM | POA: Diagnosis not present

## 2020-06-26 DIAGNOSIS — E1165 Type 2 diabetes mellitus with hyperglycemia: Secondary | ICD-10-CM | POA: Diagnosis not present

## 2020-06-26 DIAGNOSIS — N186 End stage renal disease: Secondary | ICD-10-CM | POA: Diagnosis not present

## 2020-06-26 DIAGNOSIS — K859 Acute pancreatitis without necrosis or infection, unspecified: Secondary | ICD-10-CM | POA: Diagnosis not present

## 2020-06-26 LAB — COMPREHENSIVE METABOLIC PANEL
ALT: 12 U/L (ref 0–44)
AST: 19 U/L (ref 15–41)
Albumin: 2.5 g/dL — ABNORMAL LOW (ref 3.5–5.0)
Alkaline Phosphatase: 169 U/L — ABNORMAL HIGH (ref 38–126)
Anion gap: 9 (ref 5–15)
BUN: 21 mg/dL — ABNORMAL HIGH (ref 6–20)
CO2: 23 mmol/L (ref 22–32)
Calcium: 8.1 mg/dL — ABNORMAL LOW (ref 8.9–10.3)
Chloride: 104 mmol/L (ref 98–111)
Creatinine, Ser: 3.5 mg/dL — ABNORMAL HIGH (ref 0.61–1.24)
GFR, Estimated: 20 mL/min — ABNORMAL LOW (ref >60)
Glucose, Bld: 172 mg/dL — ABNORMAL HIGH (ref 70–99)
Potassium: 4.1 mmol/L (ref 3.5–5.1)
Sodium: 136 mmol/L (ref 135–145)
Total Bilirubin: 0.5 mg/dL (ref 0.3–1.2)
Total Protein: 6.6 g/dL (ref 6.5–8.1)

## 2020-06-26 LAB — GLUCOSE, CAPILLARY
Glucose-Capillary: 148 mg/dL — ABNORMAL HIGH (ref 70–99)
Glucose-Capillary: 148 mg/dL — ABNORMAL HIGH (ref 70–99)
Glucose-Capillary: 243 mg/dL — ABNORMAL HIGH (ref 70–99)
Glucose-Capillary: 200 mg/dL — ABNORMAL HIGH (ref 70–99)

## 2020-06-26 LAB — LIPASE, BLOOD: Lipase: 34 U/L (ref 11–51)

## 2020-06-26 MED ORDER — AMLODIPINE BESYLATE 5 MG PO TABS
5.0000 mg | ORAL_TABLET | Freq: Every day | ORAL | Status: DC
Start: 2020-06-26 — End: 2020-06-28
  Administered 2020-06-26 – 2020-06-27 (×2): 5 mg via ORAL
  Filled 2020-06-26 (×4): qty 1

## 2020-06-26 NOTE — Progress Notes (Signed)
Patient called to the front desk around 2100 requesting pain medication. At the time I was with another patient.  He called again and was told by another RN, that I was in an emergency situation and that I would bring it once I was done.     At 2220, another RN went in to his room to give him his scheduled medications.  He requested pain medication at that time.  That RN explained to him that the frequency was Q4H and that it was too early for him to get it since he had received it right at 1900 making his next dose due right before 2300.     At 2255, I was pulling his pain medication when I heard him hit the call bell and very loudly tell the secretary that he wanted his pain medication because he was hurting.  The message was relayed to me.  I entered the room and stated that I had his pain medication.  Patient states that he's been "calling for hours".  I explained to patient again that the frequency was Q4H meaning he couldn't have received the dose I had until 2256.  I showed patient his MAR so that he could see the order and when he received it last.  Patient then raised his voice at me upset over the explanation.  I explained to patient that I would not tolerate him raising his voice to me or my coworkers who have no control over his pain medication and that I could not give pain medication more frequently than it was ordered no matter how many times he called.    Pain medication given.  Will continue setting boundaries regarding acceptable behavior.  Charge RN and [facility] made aware of patient complaints.

## 2020-06-26 NOTE — Progress Notes (Signed)
Pt was found to have smoked in the bathroom. Pt educated and agreed to not smoke anymore while in the hospital.

## 2020-06-26 NOTE — Plan of Care (Signed)

## 2020-06-26 NOTE — Progress Notes (Signed)
PROGRESS NOTE  Zachary Young DXA:[id number] DOB: 09/01/1960 DOA: 06/23/2020 PCP: Mellissa Kohut, DO   Brief History: 60 year old male with history of ESRD(TTS),diabetes mellitus type 2, hyperlipidemia, coronary artery disease, osteomyelitis status post right BKA, and tobacco abuse presenting with 1 day history of nausea, vomiting, and abdominal pain. He denies any fevers, chills, chest pain, shortness breath, hemoptysis, hematemesis, hematochezia, melena. Notably, the patient had 2 recent hospital admissions for acute pancreatitis for which she left AGAINST MEDICAL ADVICE. He was admitted from 06/16/2020 to 06/17/2020 with elevated lipase up to 1092. He underwent EGD which showed gastritis. Right upper quadrant ultrasound of admission was negative for any acute findings. It was felt the patient had idiopathic pancreatitis and his hematemesis was likely due to Mallory-Weiss tear. In addition, the patient also had an admission from 06/12/2020 to 06/13/2020 for acute pancreatitis for which he left AGAINST MEDICAL ADVICE. He denies any alcohol or illicit drug use or any new medications. He complains of some loose stools without any hematochezia or melena. He denies any new medications although he has very poor insight regarding his medications. In the emergency department, the patient was afebrile hemodynamically stable with oxygen saturation 98% on room air. LFTs were unremarkable, but lipase was 210. BMP showed a sodium 134, potassium 3.8, bicarbonate 24, serum creatinine 3.88. WBC 6.2, hemoglobin 12.2, platelets 292,000. Nephrology was consulted for maintenance dialysis. The patient was admitted for further evaluation of his acute pancreatitis.  Assessment/Plan: Acute idiopathic pancreatitis -Triglycerides 179 -06/13/2020 right upper quadrant ultrasound negative for acute findings -lipase 210>>42 -3/13--pt had one episode of emesis, but feels it was due to volume of his am pills;  wants to try to advance diet as abd pain is improving -advance to clears -Judicious IV fluids -Judicious opioids -Obtain urine drug screen+opiates only -In review of his medication list, certainly furosemide may be partly contributory -3/10 CT abdomen--subtle stable pancreatic edema without pancreatic fluid collection or abscess. Mild edema in the abdomen and pelvic walls suspecting anasarca;bilateral lower lobe bronchiectasis  Uncontrolled diabetes mellitus type 2 with hyperglycemia -Reduced dose Lantus>>d/c due to low sugars -NovoLog sliding scale--de-escalate to sensitive scale -06/05/2020 hemoglobin A1c 9.2  ESRD -Last dialyzed on 06/21/2020 -Nephrology consulted for maintenance dialysis  Mixedhyperlipidemia -LDL 148 -Triglycerides 179 -Total cholesterol 223 -Start statin  Chronic systolic and diastolic CHF -The patient does appear a bit hypervolemic, but suspect this is related to his ESRD and missing dialysis -52/21echo EF 50-55%, no WMA, trivial TR  Bronchiectasis/tobacco abuse -No wheezing on exam -Tobacco cessation discussed -Stable on room air -Personally reviewed chest x-ray--increased interstitial markings  Hypertension -SBP consistently in 160s to 180s -start amlodipine  Right BKA status -PT evaluation      Status is: Observation  The patient will require care spanning > 2 midnights and should be moved to inpatient because:IV treatments appropriate due to intensity of illness or inability to take PO  Dispo: The patient is from:Home Anticipated d/c is [facility]:NOBS Patient currently is not medically stable to d/c. Difficult to place patient No        Family Communication:NoFamily at bedside  Consultants:renal  Code Status: FULL  DVT Prophylaxis: [place] Heparin    Procedures: As Listed in Progress Note Above  Antibiotics: None   Subjective: Patient still complains of  upper abd pain, but improving.  Had one episode of emesis this am, but feels it may be due to his morning meds.  abd pain better now after emesis.  Denies f/c/ cp, sob, diarrhea  Objective: Vitals:   06/25/20 2011 06/25/20 2013 06/26/20 0348 06/26/20 0456  BP: (!) 163/92 (!) 164/86  (!) 146/84  Pulse: 77 77  81  Resp: 20   (!) 21  Temp: 97.8 F (36.6 C)   97.6 F (36.4 C)  TempSrc: Oral   Oral  SpO2: 100% 98%  100%  Weight:   124.6 kg   Height:        Intake/Output Summary (Last 24 hours) at 06/26/2020 1144 Last data filed at 06/26/2020 0941 Gross per 24 hour  Intake 2279.9 ml  Output 3500 ml  Net -1220.1 ml   Weight change:  Exam:   General:  Pt is alert, follows commands appropriately, not in acute distress  HEENT: No icterus, No thrush, No neck mass, [place]/AT  Cardiovascular: RRR, S1/S2, no rubs, no gallops  Respiratory: bibasilar rales; now heeze  Abdomen: Soft/+BS, epigastric and periumbilical tender, non distended, no guarding  Extremities: No edema, No lymphangitis, No petechiae, No rashes, no synovitis   Data Reviewed: I have personally reviewed following labs and imaging studies Basic Metabolic Panel: Recent Labs  Lab 06/23/20 1200 06/24/20 0616 06/25/20 0613 06/26/20 0530  NA 134* 139 140 136  K 3.8 3.6 3.7 4.1  CL 100 106 107 104  CO2 24 24 23 23   GLUCOSE 304* 125* 100* 172*  BUN 34* 31* 30* 21*  CREATININE 3.88* 3.90* 4.01* 3.50*  CALCIUM 9.0 8.5* 8.5* 8.1*  MG 1.9  --   --   --    Liver Function Tests: Recent Labs  Lab 06/23/20 1200 06/25/20 0613 06/26/20 0530  AST 11* 17 19  ALT 9 15 12   ALKPHOS 165* 181* 169*  BILITOT 0.4 0.7 0.5  PROT 7.2 6.5 6.6  ALBUMIN 2.9* 2.5* 2.5*   Recent Labs  Lab 06/23/20 1200 06/25/20 0613 06/26/20 0530  LIPASE 210* 42 34   No results for input(s): AMMONIA in the last 168 hours. Coagulation Profile: No results for input(s): INR, PROTIME in the last 168 hours. CBC: Recent Labs  Lab 06/23/20 1200  06/24/20 0616  WBC 6.2 4.8  HGB 12.2* 11.6*  HCT 40.0 38.3*  MCV 86.4 86.7  PLT 292 260   Cardiac Enzymes: No results for input(s): CKTOTAL, CKMB, CKMBINDEX, TROPONINI in the last 168 hours. BNP: Invalid input(s): POCBNP CBG: Recent Labs  Lab 06/25/20 1124 06/25/20 1614 06/25/20 2009 06/26/20 0730 06/26/20 1122  GLUCAP 87 110* 126* 148* 148*   HbA1C: No results for input(s): HGBA1C in the last 72 hours. Urine analysis:    Component Value Date/Time   COLORURINE STRAW (A) 06/23/2020 1519   APPEARANCEUR CLEAR 06/23/2020 1519   LABSPEC 1.015 06/23/2020 1519   PHURINE 7.0 06/23/2020 1519   GLUCOSEU >=500 (A) 06/23/2020 1519   HGBUR SMALL (A) 06/23/2020 1519   BILIRUBINUR NEGATIVE 06/23/2020 1519   KETONESUR NEGATIVE 06/23/2020 1519   PROTEINUR >=300 (A) 06/23/2020 1519   NITRITE NEGATIVE 06/23/2020 1519   LEUKOCYTESUR NEGATIVE 06/23/2020 1519   Sepsis Labs: @LABRCNTIP (procalcitonin:4,lacticidven:4) ) Recent Results (from the past 240 hour(s))  SARS CORONAVIRUS 2 ([place] Range 6-24 HRS) Nasopharyngeal Nasopharyngeal Swab     Status: None   Collection Time: 06/16/20 11:53 AM   Specimen: Nasopharyngeal Swab  Result Value Ref Range Status   SARS Coronavirus 2 NEGATIVE NEGATIVE Final    Comment: (NOTE) SARS-CoV-2 target nucleic acids are NOT DETECTED.  The SARS-CoV-2 RNA is generally detectable in upper and lower respiratory specimens during the acute phase  of infection. Negative results do not preclude SARS-CoV-2 infection, do not rule out co-infections with other pathogens, and should not be used as the sole basis for treatment or other patient management decisions. Negative results must be combined with clinical observations, patient history, and epidemiological information. The expected result is Negative.  Fact Sheet for Patients: SugarRoll.be  Fact Sheet for Healthcare Providers: https://www.woods-mathews.com/  This test is  not yet approved or cleared by the [place] FDA and  has been authorized for detection and/or diagnosis of SARS-CoV-2 by FDA under an Emergency Use Authorization (EUA). This EUA will remain  in effect (meaning this test can be used) for the duration of the COVID-19 declaration under Se ction 564(b)(1) of the Act, 21 U.S.C. section 360bbb-3(b)(1), unless the authorization is terminated or revoked sooner.  Performed at [facility], [place] [address] [place], [place] [address]   Resp Panel by RT-PCR (Flu A&B, Covid) Nasopharyngeal Swab     Status: None   Collection Time: 06/23/20  3:19 PM   Specimen: Nasopharyngeal Swab; Nasopharyngeal(NP) swabs in vial transport medium  Result Value Ref Range Status   SARS Coronavirus 2 by RT PCR NEGATIVE NEGATIVE Final    Comment: (NOTE) SARS-CoV-2 target nucleic acids are NOT DETECTED.  The SARS-CoV-2 RNA is generally detectable in upper respiratory specimens during the acute phase of infection. The lowest concentration of SARS-CoV-2 viral copies this assay can detect is 138 copies/mL. A negative result does not preclude SARS-Cov-2 infection and should not be used as the sole basis for treatment or other patient management decisions. A negative result may occur with  improper specimen collection/handling, submission of specimen other than nasopharyngeal swab, presence of viral mutation(s) within the areas targeted by this assay, and inadequate number of viral copies(<138 copies/mL). A negative result must be combined with clinical observations, patient history, and epidemiological information. The expected result is Negative.  Fact Sheet for Patients:  EntrepreneurPulse.com.au  Fact Sheet for Healthcare Providers:  IncredibleEmployment.be  This test is no t yet approved or cleared by the [place] FDA and  has been authorized for detection and/or diagnosis of SARS-CoV-2 by FDA under an Emergency  Use Authorization (EUA). This EUA will remain  in effect (meaning this test can be used) for the duration of the COVID-19 declaration under Section 564(b)(1) of the Act, 21 U.S.C.section 360bbb-3(b)(1), unless the authorization is terminated  or revoked sooner.       Influenza A by PCR NEGATIVE NEGATIVE Final   Influenza B by PCR NEGATIVE NEGATIVE Final    Comment: (NOTE) The Xpert Xpress SARS-CoV-2/FLU/RSV plus assay is intended as an aid in the diagnosis of influenza from Nasopharyngeal swab specimens and should not be used as a sole basis for treatment. Nasal washings and aspirates are unacceptable for Xpert Xpress SARS-CoV-2/FLU/RSV testing.  Fact Sheet for Patients: EntrepreneurPulse.com.au  Fact Sheet for Healthcare Providers: IncredibleEmployment.be  This test is not yet approved or cleared by the [place] FDA and has been authorized for detection and/or diagnosis of SARS-CoV-2 by FDA under an Emergency Use Authorization (EUA). This EUA will remain in effect (meaning this test can be used) for the duration of the COVID-19 declaration under Section 564(b)(1) of the Act, 21 U.S.C. section 360bbb-3(b)(1), unless the authorization is terminated or revoked.  Performed at [facility], [address] [place], [place] [address]      Scheduled Meds: . amLODipine  5 mg Oral Daily  . Chlorhexidine Gluconate Cloth  6 each Topical Daily  .  Chlorhexidine Gluconate Cloth  6 each Topical Q0600  . heparin  5,000 Units Subcutaneous Q8H  . insulin aspart  0-6 Units Subcutaneous TID WC  . pantoprazole (PROTONIX) IV  40 mg Intravenous Q24H  . phenytoin  300 mg Oral QHS   Continuous Infusions: . sodium chloride    . sodium chloride      Procedures/Studies: CT ABDOMEN PELVIS WO CONTRAST  Result Date: 06/23/2020 CLINICAL DATA:  Abdominal pain.  History of recent pancreatitis EXAM: CT ABDOMEN AND PELVIS WITHOUT CONTRAST TECHNIQUE:  Multidetector CT imaging of the abdomen and pelvis was performed following the standard protocol without oral or IV contrast. COMPARISON:  June 12, 2020 FINDINGS: Lower chest: There is bibasilar atelectasis. There is mild lower lobe bronchiectatic change. There is no lung base edema or consolidation. Hepatobiliary: No focal liver lesions are appreciable on this noncontrast enhanced study. Gallbladder is absent. There is no appreciable biliary duct dilatation. Pancreas: Subtle edema of the pancreas appears stable compared to recent study. There is no pancreatic duct dilatation or pancreatic mass. There is no peripancreatic fluid or soft tissue stranding in the peripancreatic region. The appearance of the pancreas is stable compared to the recent prior study. Spleen: No splenic lesions are evident. Adrenals/Urinary Tract: Adrenals bilaterally appear normal. No evident renal mass or hydronephrosis on either side. There is no appreciable renal or ureteral calculus on either side. Urinary bladder is midline with wall thickness within normal limits. Stomach/Bowel: There is no appreciable bowel wall or mesenteric thickening. No appreciable bowel obstruction. The terminal ileum appears normal. There is no evident free air or portal venous air. Vascular/Lymphatic: No abdominal aortic aneurysm. There is aortic and iliac artery atherosclerotic calcification. Prominent left inguinal lymph nodes noted. The largest of these lymph nodes in the left inguinal region has a short axis diameter of 1.4 cm. There is also a prominent lymph node in the right inguinal region with a short axis diameter of 1.3 cm. No adenopathy evident elsewhere in the abdomen or pelvis. Reproductive: There are prostatic calculi. Prostate and seminal vesicles are normal in size and contour. Other: The appendix appears normal. No evident abscess or ascites in the abdomen or pelvis. There is fat in the right inguinal ring. Musculoskeletal: No blastic or  lytic bone lesions. No intramuscular lesions. A mild degree of edema is noted in the abdominal and pelvic wall regions. IMPRESSION: 1. Stable subtle pancreatic edema, likely due to a degree of developing pancreatitis. No pancreatic fluid or mass. No pancreatic duct dilatation. No new abnormality in the pancreas/peripancreatic region. 2. No bowel wall or mesenteric thickening. No bowel obstruction. No abscess in the abdomen or pelvis. Appendix appears normal. 3.  Gallbladder absent. 4. Mild inguinal adenopathy of uncertain etiology. Adenopathy of this nature potentially could have reactive etiology. 5. Mild edema in the abdominal and pelvic wall regions without associated fluid. Clinical significance of this finding uncertain. This finding may be due to early developing anasarca secondary to chronic renal failure. 6.  Mild lower lobe bronchiectatic change bilaterally. 7.  Aortic Atherosclerosis (ICD10-I70.0). Electronically Signed   By: Lowella Grip III M.D.   On: 06/23/2020 14:50   CT ABDOMEN PELVIS WO CONTRAST  Result Date: 06/12/2020 CLINICAL DATA:  Acute nonlocalized abdominal pain. Vomiting. Renal failure. Dialysis patient. EXAM: CT ABDOMEN AND PELVIS WITHOUT CONTRAST TECHNIQUE: Multidetector CT imaging of the abdomen and pelvis was performed following the standard protocol without IV contrast. COMPARISON:  12/14/2018 FINDINGS: Lower chest: Interstitial and early alveolar pattern in the lower  lungs consistent with heart failure or fluid overload. No consolidation or collapse. No pleural effusion. Hepatobiliary: Liver parenchyma appears normal without contrast. Previous cholecystectomy. Pancreas: There appears to be mild swelling with surrounding edema in the region of the body of the pancreas suggesting early or mild pancreatitis. Spleen: Normal Adrenals/Urinary Tract: Adrenal glands are normal. Kidneys are normal. Bladder is normal. Stomach/Bowel: Stomach and small intestine are normal. Normal  appendix. No colon abnormality seen. Vascular/Lymphatic: Aortic atherosclerosis. No aneurysm. IVC is normal. No adenopathy. Reproductive: Normal Other: No free fluid or air. Musculoskeletal: Right inguinal hernia containing fat. Mild lower lumbar degenerative changes. IMPRESSION: Lung bases show interstitial and early alveolar edema consistent with fluid overload. Mild swelling of the body of the pancreas with mild surrounding edema consistent with early/mild pancreatitis. Electronically Signed   By: Nelson Chimes M.D.   On: 06/12/2020 21:56   DG Chest 2 View  Result Date: 06/23/2020 CLINICAL DATA:  Epigastric pain. EXAM: CHEST - 2 VIEW COMPARISON:  04/25/2020 FINDINGS: Cardiopericardial silhouette is at upper limits of normal for size. Vascular congestion noted with diffuse interstitial opacities suggesting edema. No substantial pleural effusion. Right-sided IJ central line tip overlies the SVC/RA junction. Telemetry leads overlie the chest. IMPRESSION: Diffuse interstitial lung opacity suggests edema. Electronically Signed   By: Misty Stanley M.D.   On: 06/23/2020 14:22   IR Removal Tun Cv Cath W/O FL  Result Date: 06/01/2020 INDICATION: Patient with history of end-stage renal disease on dialysis via tunneled HD catheter placed 11/06/2019 by Dr. Earleen Newport. He now has a working AV graft and is no longer in need of his tunneled dialysis catheter. Request is made for removal. EXAM: REMOVAL TUNNELED CENTRAL VENOUS CATHETER MEDICATIONS: 10 mL 1% lidocaine ANESTHESIA/SEDATION: None FLUOROSCOPY TIME:  None COMPLICATIONS: None immediate. PROCEDURE: Informed written consent was obtained from the patient after a thorough discussion of the procedural risks, benefits and alternatives. All questions were addressed. Maximal Sterile Barrier Technique was utilized including caps, mask, sterile gowns, sterile gloves, sterile drape, hand hygiene and skin antiseptic. A timeout was performed prior to the initiation of the  procedure. The patient's right chest and catheter was prepped and draped in a normal sterile fashion. Heparin was removed from both ports of catheter. 1% lidocaine was used for local anesthesia. Using gentle blunt dissection the cuff of the catheter was exposed and the catheter was removed in its entirety. Pressure was held till hemostasis was obtained. A sterile dressing was applied. The patient tolerated the procedure well with no immediate complications. IMPRESSION: Successful catheter removal as described above. Read by: Brynda Greathouse PA-C Electronically Signed   By: Jacqulynn Cadet M.D.   On: 06/01/2020 15:09   IR US Guide Vasc Access Left  Result Date: 06/06/2020 INDICATION: 60 year old male with left axillary to axillary loop arteriovenous hemodialysis graft created 04/05/2020 presenting with graft thrombosis. EXAM: 1. Ultrasound-guided arteriovenous graft access x2 2. Pharmacomechanical thrombolysis of arteriovenous graft 3. Rheolytic thrombectomy of arteriovenous graft 4. Balloon sweep thrombectomy of arterial anastomosis 5. Balloon angioplasty of arteriovenous graft COMPARISON:  None. MEDICATIONS: None. CONTRAST:  78m OMNIPAQUE IOHEXOL 300 MG/ML  SOLN ANESTHESIA/SEDATION: Moderate (conscious) sedation was employed during this procedure. A total of Versed 1.5 mg and Fentanyl 75 mcg was administered intravenously. Moderate Sedation Time: 99 minutes. The patient's level of consciousness and vital signs were monitored continuously by radiology nursing throughout the procedure under my direct supervision. FLUOROSCOPY TIME:  19 minutes, 18 seconds, 81 mGy COMPLICATIONS: None immediate. PROCEDURE: Informed written consent was obtained  from the patient after a discussion of the risk, benefits and alternatives to treatment. Questions regarding the procedure were encouraged and answered. A timeout was performed prior to the initiation of the procedure. The left arm dialysis graft was prepped with  Chlorhexidine in a sterile fashion, and a sterile drape was applied covering the operative field. Ultrasound evaluation of the entire graft was performed. Antegrade access near the graft apex was performed under ultrasound guidance with a micropuncture set. Over a Wholey wire, a 6 French sheath was placed. A Glidewire and angled catheter were directed to the left subclavian vein. Pull-back venogram was performed. Pharmacomechanical thrombolysis was performed of the venous outflow portion of the graft. Retrograde access was then obtained under ultrasound guidance with a micropuncture set. Over a Wholey wire, a 6 French sheath was placed. A Glidewire and angled catheter were directed to the axillary artery. Pharmacomechanical thrombolysis was performed in the arterial limb of the graft. Using a Fogarty balloon, balloon sweep was performed of the arterial anastomosis 3 times. Rheolytic thrombectomy was then performed throughout the arterial limb of the graft. This was followed by rheolytic therapy throughout the venous aspect of the graft. Fistulagram from the axillary artery demonstrated severe focal stenosis near the arterial anastomosis. Balloon angioplasty was performed at the stenotic site with a 4 mm x 2 cm mustang balloon. There is improved patency inflow after angioplasty. Additional moderate stenosis was noted in the graft apex. Balloon angioplasty with a 7 mm x 4 cm mustang balloon was performed. There are multifocal stenoses near the axillary vein anastomosis. Balloon angioplasty was performed multiple sites. Completion fistulagram was then performed. The sheaths was removed and hemostasis obtained with application of a 2-0 Ethilon pursestring suture which will be removed at the patient's next dialysis session. Dressings were placed. The patient tolerated the procedure well without immediate postprocedural complication. FINDINGS: Complete thrombosis of the left axillary to axillary loop arteriovenous graft.  Technically successful pharmacomechanical thrombolysis and rheolytic thrombectomy throughout the graft. High-grade focal stenosis at the graft arterial anastomosis which improved after angioplasty. Additional angioplasty was required at the graft apex as well as the venous anastomosis. Self-limiting axillary hematoma was observed after wire cannulation cross the venous anastomosis. There is complete cessation of extravasation after balloon tamponade. Upon completion, there was palpable thrill throughout the graft and brisk antegrade flow on completion fistulogram. IMPRESSION: 1. Thrombosed left axillary to axillary loop arteriovenous graft. 2. Technically successful declot of graft with pharmacomechanical thrombolysis, rheolytic thrombectomy, Fogarty arterial anastomosis balloon sweep, and balloon angioplasty. ACCESS: This access remains amenable to future percutaneous interventions as clinically indicated. Ruthann Cancer, MD [facility] Electronically Signed   By: Ruthann Cancer MD   On: 06/06/2020 17:42   IR US Guide Vasc Access Right  Result Date: 06/07/2020 INDICATION: History of end-stage renal disease post attempt at left upper arm declot (performed yesterday 06/06/2020) with early rethrombosis. Patient presents today for image guided placement of a tunneled dialysis catheter. EXAM: TUNNELED CENTRAL VENOUS HEMODIALYSIS CATHETER PLACEMENT WITH ULTRASOUND AND FLUOROSCOPIC GUIDANCE MEDICATIONS: Ancef 2 gm IV . The antibiotic was given in an appropriate time interval prior to skin puncture. ANESTHESIA/SEDATION: Moderate (conscious) sedation was employed during this procedure. A total of Versed 0.5 mg and Fentanyl 25 mcg was administered intravenously. Moderate Sedation Time: 17 minutes. The patient's level of consciousness and vital signs were monitored continuously by radiology nursing throughout the procedure under my direct supervision. Comparisons:  Image guided left upper arm dialysis graft  thrombolysis-06/06/2020 FLUOROSCOPY TIME:  30 seconds (16 mGy) COMPLICATIONS: None immediate. PROCEDURE: Informed written consent was obtained from the patient after a discussion of the risks, benefits, and alternatives to treatment. Questions regarding the procedure were encouraged and answered. The right neck and chest were prepped with chlorhexidine in a sterile fashion, and a sterile drape was applied covering the operative field. Maximum barrier sterile technique with sterile gowns and gloves were used for the procedure. A timeout was performed prior to the initiation of the procedure. After creating a small venotomy incision, a micropuncture kit was utilized to access the internal jugular vein. Real-time ultrasound guidance was utilized for vascular access including the acquisition of a permanent ultrasound image documenting patency of the accessed vessel. The microwire was utilized to measure appropriate catheter length. A stiff Glidewire was advanced to the level of the IVC and the micropuncture sheath was exchanged for a peel-away sheath. A palindrome tunneled hemodialysis catheter measuring 19 cm from tip to cuff was tunneled in a retrograde fashion from the anterior chest wall to the venotomy incision. The catheter was then placed through the peel-away sheath with tips ultimately positioned within the superior aspect of the right atrium. Final catheter positioning was confirmed and documented with a spot radiographic image. The catheter aspirates and flushes normally. The catheter was flushed with appropriate volume heparin dwells. The catheter exit site was secured with a 0-Prolene retention suture. The venotomy incision was closed with Dermabond and Steri-strips. Dressings were applied. The patient tolerated the procedure well without immediate post procedural complication. IMPRESSION: Successful placement of 19 cm tip to cuff tunneled hemodialysis catheter via  the right internal jugular vein with tips terminating within the superior aspect of the right atrium. The catheter is ready for immediate use. Electronically Signed   By: Sandi Mariscal M.D.   On: 06/07/2020 14:04   VAS US CAROTID  Result Date: 06/23/2020 Carotid Arterial Duplex Study Indications:       Convulsive syncope. Limitations        Today's exam was limited due to the high bifurcation of the                    carotid. Comparison Study:  TCD doppler study 06-22-2020 available. Performing Technologist: Darlin Coco RDMS,RVT  Examination Guidelines: A complete evaluation includes B-mode imaging, spectral Doppler, color Doppler, and power Doppler as needed of all accessible portions of each vessel. Bilateral testing is considered an integral part of a complete examination. Limited examinations for reoccurring indications may be performed as noted.  Right Carotid Findings: +----------+--------+--------+--------+------------------+--------+           PSV cm/sEDV cm/sStenosisPlaque DescriptionComments +----------+--------+--------+--------+------------------+--------+ CCA Prox  91      12                                         +----------+--------+--------+--------+------------------+--------+ CCA Distal68      19              heterogenous               +----------+--------+--------+--------+------------------+--------+ ICA Prox  58      17      1-39%   heterogenous               +----------+--------+--------+--------+------------------+--------+ ICA Distal84      28                                         +----------+--------+--------+--------+------------------+--------+  ECA       106     16                                         +----------+--------+--------+--------+------------------+--------+ +----------+--------+-------+----------------+-------------------+           PSV cm/sEDV cmsDescribe        Arm Pressure (mmHG)  +----------+--------+-------+----------------+-------------------+ [id number]            Multiphasic, WNL                    +----------+--------+-------+----------------+-------------------+ +---------+--------+--+--------+--+---------+ VertebralPSV cm/s43EDV cm/s13Antegrade +---------+--------+--+--------+--+---------+  Left Carotid Findings: +----------+--------+--------+--------+------------------+--------+           PSV cm/sEDV cm/sStenosisPlaque DescriptionComments +----------+--------+--------+--------+------------------+--------+ CCA Prox  114     22                                         +----------+--------+--------+--------+------------------+--------+ CCA Distal76      18              heterogenous               +----------+--------+--------+--------+------------------+--------+ ICA Prox  50      17      1-39%   heterogenous               +----------+--------+--------+--------+------------------+--------+ ICA Distal83      33                                         +----------+--------+--------+--------+------------------+--------+ ECA       79      16                                         +----------+--------+--------+--------+------------------+--------+ +----------+--------+--------+----------------+-------------------+           PSV cm/sEDV cm/sDescribe        Arm Pressure (mmHG) +----------+--------+--------+----------------+-------------------+ [id number]             Multiphasic, WNL                    +----------+--------+--------+----------------+-------------------+ +---------+--------+--+--------+--+---------+ VertebralPSV cm/s49EDV cm/s15Antegrade +---------+--------+--+--------+--+---------+   Summary: Right Carotid: Velocities in the right ICA are consistent with a 1-39% stenosis. Left Carotid: Velocities in the left ICA are consistent with a 1-39% stenosis. Vertebrals:  Bilateral vertebral arteries demonstrate  antegrade flow. Subclavians: Normal flow hemodynamics were seen in bilateral subclavian              arteries. *See table(s) above for measurements and observations.  Electronically signed by Antony Contras MD on 06/23/2020 at 11:42:46 AM.    Final    VAS [place] TRANSCRANIAL DOPPLER  Result Date: 06/23/2020  Transcranial Doppler Indications: Convulsive syncope. Comparison Study: 06-22-2020 Carotid duplex study available. Performing Technologist: Darlin Coco RDMS,RVT  Examination Guidelines: A complete evaluation includes B-mode imaging, spectral Doppler, color Doppler, and power Doppler as needed of all accessible portions of each vessel. Bilateral testing is considered an integral part of a complete examination. Limited examinations for reoccurring indications may be performed as noted.  +----------+-------------+----------+-----------+-------+ RIGHT TCD Right VM (cm)Depth (cm)PulsatilityComment +----------+-------------+----------+-----------+-------+ MCA  90.00       5.60      1.03            +----------+-------------+----------+-----------+-------+ ACA           33.00       5.90      1.00            +----------+-------------+----------+-----------+-------+ Term ICA      60.00       6.80      0.87            +----------+-------------+----------+-----------+-------+ PCA           22.00       6.10      0.96            +----------+-------------+----------+-----------+-------+ Opthalmic     14.00       4.30      1.18            +----------+-------------+----------+-----------+-------+ ICA siphon    20.00       5.00      1.17            +----------+-------------+----------+-----------+-------+ Vertebral     30.00       5.90      1.11            +----------+-------------+----------+-----------+-------+  +----------+------------+----------+-----------+-------+ LEFT TCD  Left VM (cm)Depth (cm)PulsatilityComment  +----------+------------+----------+-----------+-------+ MCA          66.00       4.40      1.16            +----------+------------+----------+-----------+-------+ ACA          32.00       5.60      1.13            +----------+------------+----------+-----------+-------+ Term ICA     63.00       5.20      0.93            +----------+------------+----------+-----------+-------+ PCA          15.00       5.40      0.53            +----------+------------+----------+-----------+-------+ Opthalmic    16.00       3.90      0.79            +----------+------------+----------+-----------+-------+ ICA siphon   15.00       5.60      0.84            +----------+------------+----------+-----------+-------+ Vertebral    23.00       6.60      0.86            +----------+------------+----------+-----------+-------+  +------------+-------+-------+             VM cm/sComment +------------+-------+-------+ Prox Basilar 25.00         +------------+-------+-------+ Summary:  Isolated mildly elevated right middle cerebral artery mean flow velocities of unclear significance. Normal mean flow velocities in remaining identified vessels of anterior and posterior cerebral circulations. *See table(s) above for TCD measurements and observations.  Diagnosing physician: Antony Contras MD Electronically signed by Antony Contras MD on 06/23/2020 at 11:49:49 AM.    Final    US ABDOMEN LIMITED RUQ (LIVER/GB)  Result Date: 06/13/2020 CLINICAL DATA:  Abdominal pain EXAM: ULTRASOUND ABDOMEN LIMITED RIGHT UPPER QUADRANT COMPARISON:  CT AP 06/12/2020 FINDINGS: Gallbladder: Status post cholecystectomy Common bile duct: Diameter: 3.3 mm Liver: No focal lesion identified. Within  normal limits in parenchymal echogenicity. Portal vein is patent on color Doppler imaging with normal direction of blood flow towards the liver. Other: None. IMPRESSION: 1. Normal exam.  No acute findings. 2. Status post  cholecystectomy. Electronically Signed   By: Kerby Moors M.D.   On: 06/13/2020 09:04    Orson Eva, DO  [facility]  If 7PM-7AM, please contact night-coverage [email or web address] Password [facility] 06/26/2020, 11:44 AM   LOS: 2 days

## 2020-06-27 ENCOUNTER — Ambulatory Visit: Payer: Medicaid Other | Admitting: Vascular Surgery

## 2020-06-27 DIAGNOSIS — I5042 Chronic combined systolic (congestive) and diastolic (congestive) heart failure: Secondary | ICD-10-CM | POA: Diagnosis not present

## 2020-06-27 DIAGNOSIS — N186 End stage renal disease: Secondary | ICD-10-CM | POA: Diagnosis not present

## 2020-06-27 DIAGNOSIS — K859 Acute pancreatitis without necrosis or infection, unspecified: Secondary | ICD-10-CM | POA: Diagnosis not present

## 2020-06-27 DIAGNOSIS — I10 Essential (primary) hypertension: Secondary | ICD-10-CM | POA: Diagnosis not present

## 2020-06-27 LAB — GLUCOSE, CAPILLARY
Glucose-Capillary: 131 mg/dL — ABNORMAL HIGH (ref 70–99)
Glucose-Capillary: 228 mg/dL — ABNORMAL HIGH (ref 70–99)
Glucose-Capillary: 220 mg/dL — ABNORMAL HIGH (ref 70–99)
Glucose-Capillary: 160 mg/dL — ABNORMAL HIGH (ref 70–99)

## 2020-06-27 MED ORDER — ATORVASTATIN CALCIUM 20 MG PO TABS
20.0000 mg | ORAL_TABLET | Freq: Every day | ORAL | Status: DC
  Administered 2020-06-27: 20 mg via ORAL
  Filled 2020-06-27: qty 1

## 2020-06-27 MED ORDER — TORSEMIDE 20 MG PO TABS
100.0000 mg | ORAL_TABLET | ORAL | Status: DC
  Administered 2020-06-27: 100 mg via ORAL
  Filled 2020-06-27: qty 5

## 2020-06-27 MED ORDER — CHLORHEXIDINE GLUCONATE CLOTH 2 % EX PADS
6.0000 | MEDICATED_PAD | Freq: Every day | CUTANEOUS | Status: DC
  Administered 2020-06-28: 6 via TOPICAL

## 2020-06-27 MED ORDER — NICOTINE 14 MG/24HR TD PT24
14.0000 mg | MEDICATED_PATCH | Freq: Every day | TRANSDERMAL | Status: DC
  Administered 2020-06-27 – 2020-06-28 (×2): 14 mg via TRANSDERMAL
  Filled 2020-06-27 (×2): qty 1

## 2020-06-27 NOTE — Progress Notes (Signed)
PROGRESS NOTE  Zachary Young TKK:[id number] DOB: [birth year] DOA: 06/23/2020 PCP: Mellissa Kohut, DO  Brief History: 60 year old male with history of ESRD(TTS),diabetes mellitus type 2, hyperlipidemia, coronary artery disease, osteomyelitis status post right BKA, and tobacco abuse presenting with 1 day history of nausea, vomiting, and abdominal pain. He denies any fevers, chills, chest pain, shortness breath, hemoptysis, hematemesis, hematochezia, melena. Notably, the patient had 2 recent hospital admissions for acute pancreatitis for which she left AGAINST MEDICAL ADVICE. He was admitted from 06/16/2020 to 06/17/2020 with elevated lipase up to 1092. He underwent EGD which showed gastritis. Right upper quadrant ultrasound of admission was negative for any acute findings. It was felt the patient had idiopathic pancreatitis and his hematemesis was likely due to Mallory-Weiss tear. In addition, the patient also had an admission from 06/12/2020 to 06/13/2020 for acute pancreatitis for which he left AGAINST MEDICAL ADVICE. He denies any alcohol or illicit drug use or any new medications. He complains of some loose stools without any hematochezia or melena. He denies any new medications although he has very poor insight regarding his medications. In the emergency department, the patient was afebrile hemodynamically stable with oxygen saturation 98% on room air. LFTs were unremarkable, but lipase was 210. BMP showed a sodium 134, potassium 3.8, bicarbonate 24, serum creatinine 3.88. WBC 6.2, hemoglobin 12.2, platelets 292,000. Nephrology was consulted for maintenance dialysis. The patient was admitted for further evaluation of his acute pancreatitis.  Assessment/Plan: Acute idiopathic pancreatitis -Triglycerides 179 -06/13/2020 right upper quadrant ultrasound negative for acute findings -lipase 210>>42 -3/13--pt had one episode of emesis, but feels it was due to volume of his am pills;  wants to try to advance diet as abd pain is improving -06/27/20--advance to soft diet -Judicious IV fluids given -Judicious opioids -Obtain urine drug screen+opiates only -In review of his medication list, certainly furosemide may be partly contributory -3/10 CT abdomen--subtle stable pancreatic edema without pancreatic fluid collection or abscess. Mild edema in the abdomen and pelvic walls suspecting anasarca;bilateral lower lobe bronchiectasis  Uncontrolled diabetes mellitus type 2 with hyperglycemia -Reduced dose Lantus>>d/c due to low sugars -NovoLog sliding scale--de-escalate to sensitive scale -06/05/2020 hemoglobin A1c 9.2  ESRD -Last dialyzed on 06/21/2020 -Nephrology consulted for maintenance dialysis  Mixedhyperlipidemia -LDL 148 -Triglycerides 179 -Total cholesterol 223 -Start statin  Chronic systolic and diastolic CHF -The patient does appear a bit hypervolemic, but suspect this is related to his ESRD and missing dialysis -52/21echo EF 50-55%, no WMA, trivial TR  Bronchiectasis/tobacco abuse -No wheezing on exam -Tobacco cessation discussed -Stable on room air -Personally reviewed chest x-ray--increased interstitial markings  Hypertension -SBP consistently in 160s to 180s -started amlodipine  Right BKA status -PT evaluation      Status is: Inpatient The patient will require care spanning > 2 midnights and should be moved to inpatient because:IV treatments appropriate due to intensity of illness or inability to take PO  Dispo: The patient is from:Home Anticipated d/c is VJ:DYNX Patient currently is not medically stable to d/c. Difficult to place patient No        Family Communication:NoFamily at bedside  Consultants:renal  Code Status: FULL  DVT Prophylaxis: [place] Heparin    Procedures: As Listed in Progress Note  Above  Antibiotics: None      Subjective: Patient tolerated full liquids.  Denies f/c, cp, sob, n/v/d.  abd pain is improving.  Objective: Vitals:   06/26/20 2121 06/26/20 2122 06/27/20 0530 06/27/20 0836  BP: Marland Kitchen)  181/109 (!) 190/117 (!) 185/98 (!) 154/85  Pulse: 81 81 86   Resp:      Temp: 97.7 F (36.5 C)  (!) 97.5 F (36.4 C)   TempSrc: Oral  Oral   SpO2: 99% 99% 95%   Weight:   122.1 kg   Height:        Intake/Output Summary (Last 24 hours) at 06/27/2020 0945 Last data filed at 06/27/2020 0500 Gross per 24 hour  Intake 960 ml  Output 1050 ml  Net -90 ml   Weight change: -2.7 kg Exam:   General:  Pt is alert, follows commands appropriately, not in acute distress  HEENT: No icterus, No thrush, No neck mass, [place]/AT  Cardiovascular: RRR, S1/S2, no rubs, no gallops  Respiratory: bibasilar rales. No wheeze  Abdomen: Soft/+BS, non tender, non distended, no guarding  Extremities: R-BKA--no edema, no erythema or drainage   Data Reviewed: I have personally reviewed following labs and imaging studies Basic Metabolic Panel: Recent Labs  Lab 06/23/20 1200 06/24/20 0616 06/25/20 0613 06/26/20 0530  NA 134* 139 140 136  K 3.8 3.6 3.7 4.1  CL 100 106 107 104  CO2 24 24 23 23   GLUCOSE 304* 125* 100* 172*  BUN 34* 31* 30* 21*  CREATININE 3.88* 3.90* 4.01* 3.50*  CALCIUM 9.0 8.5* 8.5* 8.1*  MG 1.9  --   --   --    Liver Function Tests: Recent Labs  Lab 06/23/20 1200 06/25/20 0613 06/26/20 0530  AST 11* 17 19  ALT 9 15 12   ALKPHOS 165* 181* 169*  BILITOT 0.4 0.7 0.5  PROT 7.2 6.5 6.6  ALBUMIN 2.9* 2.5* 2.5*   Recent Labs  Lab 06/23/20 1200 06/25/20 0613 06/26/20 0530  LIPASE 210* 42 34   No results for input(s): AMMONIA in the last 168 hours. Coagulation Profile: No results for input(s): INR, PROTIME in the last 168 hours. CBC: Recent Labs  Lab 06/23/20 1200 06/24/20 0616  WBC 6.2 4.8  HGB 12.2* 11.6*  HCT 40.0 38.3*  MCV 86.4 86.7   PLT 292 260   Cardiac Enzymes: No results for input(s): CKTOTAL, CKMB, CKMBINDEX, TROPONINI in the last 168 hours. BNP: Invalid input(s): POCBNP CBG: Recent Labs  Lab 06/26/20 0730 06/26/20 1122 06/26/20 1610 06/26/20 2121 06/27/20 0715  GLUCAP 148* 148* 243* 200* 131*   HbA1C: No results for input(s): HGBA1C in the last 72 hours. Urine analysis:    Component Value Date/Time   COLORURINE STRAW (A) 06/23/2020 1519   APPEARANCEUR CLEAR 06/23/2020 1519   LABSPEC 1.015 06/23/2020 1519   PHURINE 7.0 06/23/2020 1519   GLUCOSEU >=500 (A) 06/23/2020 1519   HGBUR SMALL (A) 06/23/2020 1519   BILIRUBINUR NEGATIVE 06/23/2020 1519   KETONESUR NEGATIVE 06/23/2020 1519   PROTEINUR >=300 (A) 06/23/2020 1519   NITRITE NEGATIVE 06/23/2020 1519   LEUKOCYTESUR NEGATIVE 06/23/2020 1519   Sepsis Labs: @LABRCNTIP (procalcitonin:4,lacticidven:4) ) Recent Results (from the past 240 hour(s))  Resp Panel by RT-PCR (Flu A&B, Covid) Nasopharyngeal Swab     Status: None   Collection Time: 06/23/20  3:19 PM   Specimen: Nasopharyngeal Swab; Nasopharyngeal(NP) swabs in vial transport medium  Result Value Ref Range Status   SARS Coronavirus 2 by RT PCR NEGATIVE NEGATIVE Final    Comment: (NOTE) SARS-CoV-2 target nucleic acids are NOT DETECTED.  The SARS-CoV-2 RNA is generally detectable in upper respiratory specimens during the acute phase of infection. The lowest concentration of SARS-CoV-2 viral copies this assay can detect is 138 copies/mL. A  negative result does not preclude SARS-Cov-2 infection and should not be used as the sole basis for treatment or other patient management decisions. A negative result may occur with  improper specimen collection/handling, submission of specimen other than nasopharyngeal swab, presence of viral mutation(s) within the areas targeted by this assay, and inadequate number of viral copies(<138 copies/mL). A negative result must be combined with clinical  observations, patient history, and epidemiological information. The expected result is Negative.  Fact Sheet for Patients:  EntrepreneurPulse.com.au  Fact Sheet for Healthcare Providers:  IncredibleEmployment.be  This test is no t yet approved or cleared by the [place] FDA and  has been authorized for detection and/or diagnosis of SARS-CoV-2 by FDA under an Emergency Use Authorization (EUA). This EUA will remain  in effect (meaning this test can be used) for the duration of the COVID-19 declaration under Section 564(b)(1) of the Act, 21 U.S.C.section 360bbb-3(b)(1), unless the authorization is terminated  or revoked sooner.       Influenza A by PCR NEGATIVE NEGATIVE Final   Influenza B by PCR NEGATIVE NEGATIVE Final    Comment: (NOTE) The Xpert Xpress SARS-CoV-2/FLU/RSV plus assay is intended as an aid in the diagnosis of influenza from Nasopharyngeal swab specimens and should not be used as a sole basis for treatment. Nasal washings and aspirates are unacceptable for Xpert Xpress SARS-CoV-2/FLU/RSV testing.  Fact Sheet for Patients: EntrepreneurPulse.com.au  Fact Sheet for Healthcare Providers: IncredibleEmployment.be  This test is not yet approved or cleared by the [place] FDA and has been authorized for detection and/or diagnosis of SARS-CoV-2 by FDA under an Emergency Use Authorization (EUA). This EUA will remain in effect (meaning this test can be used) for the duration of the COVID-19 declaration under Section 564(b)(1) of the Act, 21 U.S.C. section 360bbb-3(b)(1), unless the authorization is terminated or revoked.  Performed at [facility], [address], [place],  [address]      Scheduled Meds: . amLODipine  5 mg Oral Daily  . Chlorhexidine Gluconate Cloth  6 each Topical Daily  . Chlorhexidine Gluconate Cloth  6 each Topical Q0600  . Chlorhexidine Gluconate Cloth  6  each Topical Q0600  . heparin  5,000 Units Subcutaneous Q8H  . insulin aspart  0-6 Units Subcutaneous TID WC  . pantoprazole (PROTONIX) IV  40 mg Intravenous Q24H  . phenytoin  300 mg Oral QHS  . torsemide  100 mg Oral Q M,W,F-HD   Continuous Infusions: . sodium chloride    . sodium chloride      Procedures/Studies: CT ABDOMEN PELVIS WO CONTRAST  Result Date: 06/23/2020 CLINICAL DATA:  Abdominal pain.  History of recent pancreatitis EXAM: CT ABDOMEN AND PELVIS WITHOUT CONTRAST TECHNIQUE: Multidetector CT imaging of the abdomen and pelvis was performed following the standard protocol without oral or IV contrast. COMPARISON:  June 12, 2020 FINDINGS: Lower chest: There is bibasilar atelectasis. There is mild lower lobe bronchiectatic change. There is no lung base edema or consolidation. Hepatobiliary: No focal liver lesions are appreciable on this noncontrast enhanced study. Gallbladder is absent. There is no appreciable biliary duct dilatation. Pancreas: Subtle edema of the pancreas appears stable compared to recent study. There is no pancreatic duct dilatation or pancreatic mass. There is no peripancreatic fluid or soft tissue stranding in the peripancreatic region. The appearance of the pancreas is stable compared to the recent prior study. Spleen: No splenic lesions are evident. Adrenals/Urinary Tract: Adrenals bilaterally appear normal. No evident renal mass or hydronephrosis on either side. There  is no appreciable renal or ureteral calculus on either side. Urinary bladder is midline with wall thickness within normal limits. Stomach/Bowel: There is no appreciable bowel wall or mesenteric thickening. No appreciable bowel obstruction. The terminal ileum appears normal. There is no evident free air or portal venous air. Vascular/Lymphatic: No abdominal aortic aneurysm. There is aortic and iliac artery atherosclerotic calcification. Prominent left inguinal lymph nodes noted. The largest of these  lymph nodes in the left inguinal region has a short axis diameter of 1.4 cm. There is also a prominent lymph node in the right inguinal region with a short axis diameter of 1.3 cm. No adenopathy evident elsewhere in the abdomen or pelvis. Reproductive: There are prostatic calculi. Prostate and seminal vesicles are normal in size and contour. Other: The appendix appears normal. No evident abscess or ascites in the abdomen or pelvis. There is fat in the right inguinal ring. Musculoskeletal: No blastic or lytic bone lesions. No intramuscular lesions. A mild degree of edema is noted in the abdominal and pelvic wall regions. IMPRESSION: 1. Stable subtle pancreatic edema, likely due to a degree of developing pancreatitis. No pancreatic fluid or mass. No pancreatic duct dilatation. No new abnormality in the pancreas/peripancreatic region. 2. No bowel wall or mesenteric thickening. No bowel obstruction. No abscess in the abdomen or pelvis. Appendix appears normal. 3.  Gallbladder absent. 4. Mild inguinal adenopathy of uncertain etiology. Adenopathy of this nature potentially could have reactive etiology. 5. Mild edema in the abdominal and pelvic wall regions without associated fluid. Clinical significance of this finding uncertain. This finding may be due to early developing anasarca secondary to chronic renal failure. 6.  Mild lower lobe bronchiectatic change bilaterally. 7.  Aortic Atherosclerosis (ICD10-I70.0). Electronically Signed   By: Lowella Grip III M.D.   On: 06/23/2020 14:50   CT ABDOMEN PELVIS WO CONTRAST  Result Date: 06/12/2020 CLINICAL DATA:  Acute nonlocalized abdominal pain. Vomiting. Renal failure. Dialysis patient. EXAM: CT ABDOMEN AND PELVIS WITHOUT CONTRAST TECHNIQUE: Multidetector CT imaging of the abdomen and pelvis was performed following the standard protocol without IV contrast. COMPARISON:  12/14/2018 FINDINGS: Lower chest: Interstitial and early alveolar pattern in the lower lungs  consistent with heart failure or fluid overload. No consolidation or collapse. No pleural effusion. Hepatobiliary: Liver parenchyma appears normal without contrast. Previous cholecystectomy. Pancreas: There appears to be mild swelling with surrounding edema in the region of the body of the pancreas suggesting early or mild pancreatitis. Spleen: Normal Adrenals/Urinary Tract: Adrenal glands are normal. Kidneys are normal. Bladder is normal. Stomach/Bowel: Stomach and small intestine are normal. Normal appendix. No colon abnormality seen. Vascular/Lymphatic: Aortic atherosclerosis. No aneurysm. IVC is normal. No adenopathy. Reproductive: Normal Other: No free fluid or air. Musculoskeletal: Right inguinal hernia containing fat. Mild lower lumbar degenerative changes. IMPRESSION: Lung bases show interstitial and early alveolar edema consistent with fluid overload. Mild swelling of the body of the pancreas with mild surrounding edema consistent with early/mild pancreatitis. Electronically Signed   By: Nelson Chimes M.D.   On: 06/12/2020 21:56   DG Chest 2 View  Result Date: 06/23/2020 CLINICAL DATA:  Epigastric pain. EXAM: CHEST - 2 VIEW COMPARISON:  04/25/2020 FINDINGS: Cardiopericardial silhouette is at upper limits of normal for size. Vascular congestion noted with diffuse interstitial opacities suggesting edema. No substantial pleural effusion. Right-sided IJ central line tip overlies the SVC/RA junction. Telemetry leads overlie the chest. IMPRESSION: Diffuse interstitial lung opacity suggests edema. Electronically Signed   By: Misty Stanley M.D.   On:  06/23/2020 14:22   IR Removal Tun Cv Cath W/O FL  Result Date: 06/01/2020 INDICATION: Patient with history of end-stage renal disease on dialysis via tunneled HD catheter placed 11/06/2019 by Dr. Earleen Newport. He now has a working AV graft and is no longer in need of his tunneled dialysis catheter. Request is made for removal. EXAM: REMOVAL TUNNELED CENTRAL VENOUS  CATHETER MEDICATIONS: 10 mL 1% lidocaine ANESTHESIA/SEDATION: None FLUOROSCOPY TIME:  None COMPLICATIONS: None immediate. PROCEDURE: Informed written consent was obtained from the patient after a thorough discussion of the procedural risks, benefits and alternatives. All questions were addressed. Maximal Sterile Barrier Technique was utilized including caps, mask, sterile gowns, sterile gloves, sterile drape, hand hygiene and skin antiseptic. A timeout was performed prior to the initiation of the procedure. The patient's right chest and catheter was prepped and draped in a normal sterile fashion. Heparin was removed from both ports of catheter. 1% lidocaine was used for local anesthesia. Using gentle blunt dissection the cuff of the catheter was exposed and the catheter was removed in its entirety. Pressure was held till hemostasis was obtained. A sterile dressing was applied. The patient tolerated the procedure well with no immediate complications. IMPRESSION: Successful catheter removal as described above. Read by: Brynda Greathouse PA-C Electronically Signed   By: Jacqulynn Cadet M.D.   On: 06/01/2020 15:09   IR US Guide Vasc Access Left  Result Date: 06/06/2020 INDICATION: 60 year old male with left axillary to axillary loop arteriovenous hemodialysis graft created 04/05/2020 presenting with graft thrombosis. EXAM: 1. Ultrasound-guided arteriovenous graft access x2 2. Pharmacomechanical thrombolysis of arteriovenous graft 3. Rheolytic thrombectomy of arteriovenous graft 4. Balloon sweep thrombectomy of arterial anastomosis 5. Balloon angioplasty of arteriovenous graft COMPARISON:  None. MEDICATIONS: None. CONTRAST:  29m OMNIPAQUE IOHEXOL 300 MG/ML  SOLN ANESTHESIA/SEDATION: Moderate (conscious) sedation was employed during this procedure. A total of Versed 1.5 mg and Fentanyl 75 mcg was administered intravenously. Moderate Sedation Time: 99 minutes. The patient's level of consciousness and vital signs were  monitored continuously by radiology nursing throughout the procedure under my direct supervision. FLUOROSCOPY TIME:  19 minutes, 18 seconds, 81 mGy COMPLICATIONS: None immediate. PROCEDURE: Informed written consent was obtained from the patient after a discussion of the risk, benefits and alternatives to treatment. Questions regarding the procedure were encouraged and answered. A timeout was performed prior to the initiation of the procedure. The left arm dialysis graft was prepped with Chlorhexidine in a sterile fashion, and a sterile drape was applied covering the operative field. Ultrasound evaluation of the entire graft was performed. Antegrade access near the graft apex was performed under ultrasound guidance with a micropuncture set. Over a Wholey wire, a 6 French sheath was placed. A Glidewire and angled catheter were directed to the left subclavian vein. Pull-back venogram was performed. Pharmacomechanical thrombolysis was performed of the venous outflow portion of the graft. Retrograde access was then obtained under ultrasound guidance with a micropuncture set. Over a Wholey wire, a 6 French sheath was placed. A Glidewire and angled catheter were directed to the axillary artery. Pharmacomechanical thrombolysis was performed in the arterial limb of the graft. Using a Fogarty balloon, balloon sweep was performed of the arterial anastomosis 3 times. Rheolytic thrombectomy was then performed throughout the arterial limb of the graft. This was followed by rheolytic therapy throughout the venous aspect of the graft. Fistulagram from the axillary artery demonstrated severe focal stenosis near the arterial anastomosis. Balloon angioplasty was performed at the stenotic site with a 4 mm x 2  cm mustang balloon. There is improved patency inflow after angioplasty. Additional moderate stenosis was noted in the graft apex. Balloon angioplasty with a 7 mm x 4 cm mustang balloon was performed. There are multifocal stenoses  near the axillary vein anastomosis. Balloon angioplasty was performed multiple sites. Completion fistulagram was then performed. The sheaths was removed and hemostasis obtained with application of a 2-0 Ethilon pursestring suture which will be removed at the patient's next dialysis session. Dressings were placed. The patient tolerated the procedure well without immediate postprocedural complication. FINDINGS: Complete thrombosis of the left axillary to axillary loop arteriovenous graft. Technically successful pharmacomechanical thrombolysis and rheolytic thrombectomy throughout the graft. High-grade focal stenosis at the graft arterial anastomosis which improved after angioplasty. Additional angioplasty was required at the graft apex as well as the venous anastomosis. Self-limiting axillary hematoma was observed after wire cannulation cross the venous anastomosis. There is complete cessation of extravasation after balloon tamponade. Upon completion, there was palpable thrill throughout the graft and brisk antegrade flow on completion fistulogram. IMPRESSION: 1. Thrombosed left axillary to axillary loop arteriovenous graft. 2. Technically successful declot of graft with pharmacomechanical thrombolysis, rheolytic thrombectomy, Fogarty arterial anastomosis balloon sweep, and balloon angioplasty. ACCESS: This access remains amenable to future percutaneous interventions as clinically indicated. Ruthann Cancer, MD [facility] Electronically Signed   By: Ruthann Cancer MD   On: 06/06/2020 17:42   IR US Guide Vasc Access Right  Result Date: 06/07/2020 INDICATION: History of end-stage renal disease post attempt at left upper arm declot (performed yesterday 06/06/2020) with early rethrombosis. Patient presents today for image guided placement of a tunneled dialysis catheter. EXAM: TUNNELED CENTRAL VENOUS HEMODIALYSIS CATHETER PLACEMENT WITH ULTRASOUND AND  FLUOROSCOPIC GUIDANCE MEDICATIONS: Ancef 2 gm IV . The antibiotic was given in an appropriate time interval prior to skin puncture. ANESTHESIA/SEDATION: Moderate (conscious) sedation was employed during this procedure. A total of Versed 0.5 mg and Fentanyl 25 mcg was administered intravenously. Moderate Sedation Time: 17 minutes. The patient's level of consciousness and vital signs were monitored continuously by radiology nursing throughout the procedure under my direct supervision. Comparisons: Image guided left upper arm dialysis graft thrombolysis-06/06/2020 FLUOROSCOPY TIME:  30 seconds (16 mGy) COMPLICATIONS: None immediate. PROCEDURE: Informed written consent was obtained from the patient after a discussion of the risks, benefits, and alternatives to treatment. Questions regarding the procedure were encouraged and answered. The right neck and chest were prepped with chlorhexidine in a sterile fashion, and a sterile drape was applied covering the operative field. Maximum barrier sterile technique with sterile gowns and gloves were used for the procedure. A timeout was performed prior to the initiation of the procedure. After creating a small venotomy incision, a micropuncture kit was utilized to access the internal jugular vein. Real-time ultrasound guidance was utilized for vascular access including the acquisition of a permanent ultrasound image documenting patency of the accessed vessel. The microwire was utilized to measure appropriate catheter length. A stiff Glidewire was advanced to the level of the IVC and the micropuncture sheath was exchanged for a peel-away sheath. A palindrome tunneled hemodialysis catheter measuring 19 cm from tip to cuff was tunneled in a retrograde fashion from the anterior chest wall to the venotomy incision. The catheter was then placed through the peel-away sheath with tips ultimately positioned within the superior aspect of the right atrium. Final catheter positioning was  confirmed and documented with a spot radiographic image. The catheter aspirates and flushes normally. The catheter was flushed with  appropriate volume heparin dwells. The catheter exit site was secured with a 0-Prolene retention suture. The venotomy incision was closed with Dermabond and Steri-strips. Dressings were applied. The patient tolerated the procedure well without immediate post procedural complication. IMPRESSION: Successful placement of 19 cm tip to cuff tunneled hemodialysis catheter via the right internal jugular vein with tips terminating within the superior aspect of the right atrium. The catheter is ready for immediate use. Electronically Signed   By: Sandi Mariscal M.D.   On: 06/07/2020 14:04   VAS US CAROTID  Result Date: 06/23/2020 Carotid Arterial Duplex Study Indications:       Convulsive syncope. Limitations        Today's exam was limited due to the high bifurcation of the                    carotid. Comparison Study:  TCD doppler study 06-22-2020 available. Performing Technologist: Darlin Coco RDMS,RVT  Examination Guidelines: A complete evaluation includes B-mode imaging, spectral Doppler, color Doppler, and power Doppler as needed of all accessible portions of each vessel. Bilateral testing is considered an integral part of a complete examination. Limited examinations for reoccurring indications may be performed as noted.  Right Carotid Findings: +----------+--------+--------+--------+------------------+--------+           PSV cm/sEDV cm/sStenosisPlaque DescriptionComments +----------+--------+--------+--------+------------------+--------+ CCA Prox  91      12                                         +----------+--------+--------+--------+------------------+--------+ CCA Distal68      19              heterogenous               +----------+--------+--------+--------+------------------+--------+ ICA Prox  58      17      1-39%   heterogenous                +----------+--------+--------+--------+------------------+--------+ ICA Distal84      28                                         +----------+--------+--------+--------+------------------+--------+ ECA       106     16                                         +----------+--------+--------+--------+------------------+--------+ +----------+--------+-------+----------------+-------------------+           PSV cm/sEDV cmsDescribe        Arm Pressure (mmHG) +----------+--------+-------+----------------+-------------------+ [id number]            Multiphasic, WNL                    +----------+--------+-------+----------------+-------------------+ +---------+--------+--+--------+--+---------+ VertebralPSV cm/s43EDV cm/s13Antegrade +---------+--------+--+--------+--+---------+  Left Carotid Findings: +----------+--------+--------+--------+------------------+--------+           PSV cm/sEDV cm/sStenosisPlaque DescriptionComments +----------+--------+--------+--------+------------------+--------+ CCA Prox  114     22                                         +----------+--------+--------+--------+------------------+--------+ CCA Distal76      18  heterogenous               +----------+--------+--------+--------+------------------+--------+ ICA Prox  50      17      1-39%   heterogenous               +----------+--------+--------+--------+------------------+--------+ ICA Distal83      33                                         +----------+--------+--------+--------+------------------+--------+ ECA       79      16                                         +----------+--------+--------+--------+------------------+--------+ +----------+--------+--------+----------------+-------------------+           PSV cm/sEDV cm/sDescribe        Arm Pressure (mmHG) +----------+--------+--------+----------------+-------------------+ [id number]              Multiphasic, WNL                    +----------+--------+--------+----------------+-------------------+ +---------+--------+--+--------+--+---------+ VertebralPSV cm/s49EDV cm/s15Antegrade +---------+--------+--+--------+--+---------+   Summary: Right Carotid: Velocities in the right ICA are consistent with a 1-39% stenosis. Left Carotid: Velocities in the left ICA are consistent with a 1-39% stenosis. Vertebrals:  Bilateral vertebral arteries demonstrate antegrade flow. Subclavians: Normal flow hemodynamics were seen in bilateral subclavian              arteries. *See table(s) above for measurements and observations.  Electronically signed by Antony Contras MD on 06/23/2020 at 11:42:46 AM.    Final    VAS [place] TRANSCRANIAL DOPPLER  Result Date: 06/23/2020  Transcranial Doppler Indications: Convulsive syncope. Comparison Study: 06-22-2020 Carotid duplex study available. Performing Technologist: Darlin Coco RDMS,RVT  Examination Guidelines: A complete evaluation includes B-mode imaging, spectral Doppler, color Doppler, and power Doppler as needed of all accessible portions of each vessel. Bilateral testing is considered an integral part of a complete examination. Limited examinations for reoccurring indications may be performed as noted.  +----------+-------------+----------+-----------+-------+ RIGHT TCD Right VM (cm)Depth (cm)PulsatilityComment +----------+-------------+----------+-----------+-------+ MCA           90.00       5.60      1.03            +----------+-------------+----------+-----------+-------+ ACA           33.00       5.90      1.00            +----------+-------------+----------+-----------+-------+ Term ICA      60.00       6.80      0.87            +----------+-------------+----------+-----------+-------+ PCA           22.00       6.10      0.96            +----------+-------------+----------+-----------+-------+ Opthalmic     14.00       4.30       1.18            +----------+-------------+----------+-----------+-------+ ICA siphon    20.00       5.00      1.17            +----------+-------------+----------+-----------+-------+ Vertebral     30.00  5.90      1.11            +----------+-------------+----------+-----------+-------+  +----------+------------+----------+-----------+-------+ LEFT TCD  Left VM (cm)Depth (cm)PulsatilityComment +----------+------------+----------+-----------+-------+ MCA          66.00       4.40      1.16            +----------+------------+----------+-----------+-------+ ACA          32.00       5.60      1.13            +----------+------------+----------+-----------+-------+ Term ICA     63.00       5.20      0.93            +----------+------------+----------+-----------+-------+ PCA          15.00       5.40      0.53            +----------+------------+----------+-----------+-------+ Opthalmic    16.00       3.90      0.79            +----------+------------+----------+-----------+-------+ ICA siphon   15.00       5.60      0.84            +----------+------------+----------+-----------+-------+ Vertebral    23.00       6.60      0.86            +----------+------------+----------+-----------+-------+  +------------+-------+-------+             VM cm/sComment +------------+-------+-------+ Prox Basilar 25.00         +------------+-------+-------+ Summary:  Isolated mildly elevated right middle cerebral artery mean flow velocities of unclear significance. Normal mean flow velocities in remaining identified vessels of anterior and posterior cerebral circulations. *See table(s) above for TCD measurements and observations.  Diagnosing physician: Antony Contras MD Electronically signed by Antony Contras MD on 06/23/2020 at 11:49:49 AM.    Final    US ABDOMEN LIMITED RUQ (LIVER/GB)  Result Date: 06/13/2020 CLINICAL DATA:  Abdominal pain EXAM: ULTRASOUND  ABDOMEN LIMITED RIGHT UPPER QUADRANT COMPARISON:  CT AP 06/12/2020 FINDINGS: Gallbladder: Status post cholecystectomy Common bile duct: Diameter: 3.3 mm Liver: No focal lesion identified. Within normal limits in parenchymal echogenicity. Portal vein is patent on color Doppler imaging with normal direction of blood flow towards the liver. Other: None. IMPRESSION: 1. Normal exam.  No acute findings. 2. Status post cholecystectomy. Electronically Signed   By: Kerby Moors M.D.   On: 06/13/2020 09:04    Orson Eva, DO  [facility]  If 7PM-7AM, please contact night-coverage [email or web address] Password [email or web address] 06/27/2020, 9:45 AM   LOS: 3 days

## 2020-06-27 NOTE — Progress Notes (Signed)
Patient was smoking in room security came to patients room and spoke to patient. security told this nurse patient would like a nicotine patch. This nurse paged MD for nicotine patch.

## 2020-06-27 NOTE — Discharge Summary (Signed)
Physician Discharge Summary  Zachary Young YQM:[id number] DOB: 06/15/1960 DOA: 06/23/2020  PCP: Mellissa Kohut, DO  Admit date: 06/23/2020 Discharge date: 06/28/2020  Admitted From: Home Disposition:  Home   Recommendations for Outpatient Follow-up:  1. Follow up with PCP in 1-2 weeks 2. Please obtain BMP/CBC in one week     Discharge Condition: Stable CODE STATUS: FULL Diet recommendation: Heart Healthy / Carb Modified   Brief/Interim Summary: 60 year old male with history of ESRD(TTS),diabetes mellitus type 2, hyperlipidemia, coronary artery disease, osteomyelitis status post right BKA, and tobacco abuse presenting with 1 day history of nausea, vomiting, and abdominal pain. He denies any fevers, chills, chest pain, shortness breath, hemoptysis, hematemesis, hematochezia, melena. Notably, the patient had 2 recent hospital admissions for acute pancreatitis for which she left AGAINST MEDICAL ADVICE. He was admitted from 06/16/2020 to 06/17/2020 with elevated lipase up to 1092. He underwent EGD which showed gastritis. Right upper quadrant ultrasound of admission was negative for any acute findings. It was felt the patient had idiopathic pancreatitis and his hematemesis was likely due to Mallory-Weiss tear. In addition, the patient also had an admission from 06/12/2020 to 06/13/2020 for acute pancreatitis for which he left AGAINST MEDICAL ADVICE. He denies any alcohol or illicit drug use or any new medications. He complains of some loose stools without any hematochezia or melena. He denies any new medications although he has very poor insight regarding his medications. In the emergency department, the patient was afebrile hemodynamically stable with oxygen saturation 98% on room air. LFTs were unremarkable, but lipase was 210. BMP showed a sodium 134, potassium 3.8, bicarbonate 24, serum creatinine 3.88. WBC 6.2, hemoglobin 12.2, platelets 292,000. Nephrology was consulted for  maintenance dialysis. The patient was admitted for further evaluation of his acute pancreatitis.  He was placed on bowel rest and started on judicious IVF and opioids.  He slowly improved and his diet was slowly advanced.  Renal was consulted for HD and continued to follow the patient.  Discharge Diagnoses:  Acute idiopathic pancreatitis -Triglycerides 179 -06/13/2020 right upper quadrant ultrasound negative for acute findings -lipase 210>>42 -3/13--pt had one episode of emesis, but feels it was due to volume of his am pills; wants to try to advance diet as abd pain is improving -06/27/20--advance to soft diet which patient tolerated -Judicious IV fluids given -Judicious opioids -Obtain urine drug screen+opiates only -In review of his medication list, certainly furosemide may be partly contributory -3/10 CT abdomen--subtle stable pancreatic edema without pancreatic fluid collection or abscess. Mild edema in the abdomen and pelvic walls suspecting anasarca;bilateral lower lobe bronchiectasis -d/c home with torsemide  Uncontrolled diabetes mellitus type 2 with hyperglycemia -Reduced dose Lantus>>d/c due to low sugars -NovoLog sliding scale--de-escalate to sensitive scale -06/05/2020 hemoglobin A1c 9.2 -CBGs largely controlled during the hospitalization  ESRD -Last dialyzed on 06/21/2020 prior to admission -Nephrology consulted for maintenance dialysis -dialyzed on 06/28/20 prior to discharge  Mixedhyperlipidemia -LDL 148 -Triglycerides 179 -Total cholesterol 223 -Start statin  Chronic systolic and diastolic CHF -The patient does appear a bit hypervolemic, but suspect this is related to his ESRD and missing dialysis -52/21echo EF 50-55%, no WMA, trivial TR  Bronchiectasis/tobacco abuse -No wheezing on exam -Tobacco cessation discussed -Stable on room air -Personally reviewed chest x-ray--increased interstitial markings  Hypertension -SBP consistently in 160s to  180s -started amlodipine  Right BKA status -PT evaluation--no PT follow up    Discharge Instructions   Allergies as of 06/28/2020      Reactions   Bee Venom  Anaphylaxis   Per pt, nearly died from bee sting as a child   Propofol Other (See Comments)    Hallucinations   Morphine Nausea And Vomiting   Oxycodone Nausea And Vomiting      Medication List    STOP taking these medications   albuterol 108 (90 Base) MCG/ACT inhaler Commonly known as: VENTOLIN HFA   furosemide 80 MG tablet Commonly known as: LASIX   midodrine 10 MG tablet Commonly known as: PROAMATINE     TAKE these medications   amLODipine 5 MG tablet Commonly known as: NORVASC Take 1 tablet (5 mg total) by mouth daily.   aspirin 81 MG EC tablet Take 1 tablet (81 mg total) by mouth daily.   atorvastatin 20 MG tablet Commonly known as: LIPITOR Take 1 tablet (20 mg total) by mouth daily with supper.   ergocalciferol 1.25 MG (50000 UT) capsule Commonly known as: VITAMIN D2 Take 1 capsule (50,000 Units total) by mouth every Friday.   insulin glargine 100 UNIT/ML injection Commonly known as: LANTUS Inject 0.35 mLs (35 Units total) into the skin at bedtime. What changed: how much to take   multivitamin with minerals Tabs tablet Take 1 tablet by mouth daily.   NovoLOG FlexPen 100 UNIT/ML FlexPen Generic drug: insulin aspart Inject 10 Units into the skin 3 (three) times daily.   PEN NEEDLES 31GX5/16" 31G X 8 MM Misc 1 each by Other route daily.   phenytoin 100 MG ER capsule Commonly known as: DILANTIN Take 3 capsules (300 mg total) by mouth at bedtime.   phenytoin 100 MG ER capsule Commonly known as: DILANTIN TAKE 3 CAPSULES(300 MG) BY MOUTH AT BEDTIME   pregabalin 50 MG capsule Commonly known as: LYRICA Take 1 capsule (50 mg total) by mouth 2 (two) times daily.   sevelamer 800 MG tablet Commonly known as: RENAGEL Take 1,600 mg by mouth 3 (three) times daily with meals.   torsemide 100  MG tablet Commonly known as: DEMADEX Take 1 tablet (100 mg total) by mouth daily. On Monday-Wednesday-Friday-Sunday (non-dialysis days)       Allergies  Allergen Reactions  . Bee Venom Anaphylaxis    Per pt, nearly died from bee sting as a child  . Propofol Other (See Comments)     Hallucinations  . Morphine Nausea And Vomiting  . Oxycodone Nausea And Vomiting    Consultations:  renal   Procedures/Studies: CT ABDOMEN PELVIS WO CONTRAST  Result Date: 06/23/2020 CLINICAL DATA:  Abdominal pain.  History of recent pancreatitis EXAM: CT ABDOMEN AND PELVIS WITHOUT CONTRAST TECHNIQUE: Multidetector CT imaging of the abdomen and pelvis was performed following the standard protocol without oral or IV contrast. COMPARISON:  June 12, 2020 FINDINGS: Lower chest: There is bibasilar atelectasis. There is mild lower lobe bronchiectatic change. There is no lung base edema or consolidation. Hepatobiliary: No focal liver lesions are appreciable on this noncontrast enhanced study. Gallbladder is absent. There is no appreciable biliary duct dilatation. Pancreas: Subtle edema of the pancreas appears stable compared to recent study. There is no pancreatic duct dilatation or pancreatic mass. There is no peripancreatic fluid or soft tissue stranding in the peripancreatic region. The appearance of the pancreas is stable compared to the recent prior study. Spleen: No splenic lesions are evident. Adrenals/Urinary Tract: Adrenals bilaterally appear normal. No evident renal mass or hydronephrosis on either side. There is no appreciable renal or ureteral calculus on either side. Urinary bladder is midline with wall thickness within normal limits. Stomach/Bowel: There is  no appreciable bowel wall or mesenteric thickening. No appreciable bowel obstruction. The terminal ileum appears normal. There is no evident free air or portal venous air. Vascular/Lymphatic: No abdominal aortic aneurysm. There is aortic and iliac  artery atherosclerotic calcification. Prominent left inguinal lymph nodes noted. The largest of these lymph nodes in the left inguinal region has a short axis diameter of 1.4 cm. There is also a prominent lymph node in the right inguinal region with a short axis diameter of 1.3 cm. No adenopathy evident elsewhere in the abdomen or pelvis. Reproductive: There are prostatic calculi. Prostate and seminal vesicles are normal in size and contour. Other: The appendix appears normal. No evident abscess or ascites in the abdomen or pelvis. There is fat in the right inguinal ring. Musculoskeletal: No blastic or lytic bone lesions. No intramuscular lesions. A mild degree of edema is noted in the abdominal and pelvic wall regions. IMPRESSION: 1. Stable subtle pancreatic edema, likely due to a degree of developing pancreatitis. No pancreatic fluid or mass. No pancreatic duct dilatation. No new abnormality in the pancreas/peripancreatic region. 2. No bowel wall or mesenteric thickening. No bowel obstruction. No abscess in the abdomen or pelvis. Appendix appears normal. 3.  Gallbladder absent. 4. Mild inguinal adenopathy of uncertain etiology. Adenopathy of this nature potentially could have reactive etiology. 5. Mild edema in the abdominal and pelvic wall regions without associated fluid. Clinical significance of this finding uncertain. This finding may be due to early developing anasarca secondary to chronic renal failure. 6.  Mild lower lobe bronchiectatic change bilaterally. 7.  Aortic Atherosclerosis (ICD10-I70.0). Electronically Signed   By: Lowella Grip III M.D.   On: 06/23/2020 14:50   CT ABDOMEN PELVIS WO CONTRAST  Result Date: 06/12/2020 CLINICAL DATA:  Acute nonlocalized abdominal pain. Vomiting. Renal failure. Dialysis patient. EXAM: CT ABDOMEN AND PELVIS WITHOUT CONTRAST TECHNIQUE: Multidetector CT imaging of the abdomen and pelvis was performed following the standard protocol without IV contrast.  COMPARISON:  12/14/2018 FINDINGS: Lower chest: Interstitial and early alveolar pattern in the lower lungs consistent with heart failure or fluid overload. No consolidation or collapse. No pleural effusion. Hepatobiliary: Liver parenchyma appears normal without contrast. Previous cholecystectomy. Pancreas: There appears to be mild swelling with surrounding edema in the region of the body of the pancreas suggesting early or mild pancreatitis. Spleen: Normal Adrenals/Urinary Tract: Adrenal glands are normal. Kidneys are normal. Bladder is normal. Stomach/Bowel: Stomach and small intestine are normal. Normal appendix. No colon abnormality seen. Vascular/Lymphatic: Aortic atherosclerosis. No aneurysm. IVC is normal. No adenopathy. Reproductive: Normal Other: No free fluid or air. Musculoskeletal: Right inguinal hernia containing fat. Mild lower lumbar degenerative changes. IMPRESSION: Lung bases show interstitial and early alveolar edema consistent with fluid overload. Mild swelling of the body of the pancreas with mild surrounding edema consistent with early/mild pancreatitis. Electronically Signed   By: Nelson Chimes M.D.   On: 06/12/2020 21:56   DG Chest 2 View  Result Date: 06/23/2020 CLINICAL DATA:  Epigastric pain. EXAM: CHEST - 2 VIEW COMPARISON:  04/25/2020 FINDINGS: Cardiopericardial silhouette is at upper limits of normal for size. Vascular congestion noted with diffuse interstitial opacities suggesting edema. No substantial pleural effusion. Right-sided IJ central line tip overlies the SVC/RA junction. Telemetry leads overlie the chest. IMPRESSION: Diffuse interstitial lung opacity suggests edema. Electronically Signed   By: Misty Stanley M.D.   On: 06/23/2020 14:22   IR Removal Tun Cv Cath W/O FL  Result Date: 06/01/2020 INDICATION: Patient with history of end-stage renal disease  on dialysis via tunneled HD catheter placed 11/06/2019 by Dr. Earleen Newport. He now has a working AV graft and is no longer in need  of his tunneled dialysis catheter. Request is made for removal. EXAM: REMOVAL TUNNELED CENTRAL VENOUS CATHETER MEDICATIONS: 10 mL 1% lidocaine ANESTHESIA/SEDATION: None FLUOROSCOPY TIME:  None COMPLICATIONS: None immediate. PROCEDURE: Informed written consent was obtained from the patient after a thorough discussion of the procedural risks, benefits and alternatives. All questions were addressed. Maximal Sterile Barrier Technique was utilized including caps, mask, sterile gowns, sterile gloves, sterile drape, hand hygiene and skin antiseptic. A timeout was performed prior to the initiation of the procedure. The patient's right chest and catheter was prepped and draped in a normal sterile fashion. Heparin was removed from both ports of catheter. 1% lidocaine was used for local anesthesia. Using gentle blunt dissection the cuff of the catheter was exposed and the catheter was removed in its entirety. Pressure was held till hemostasis was obtained. A sterile dressing was applied. The patient tolerated the procedure well with no immediate complications. IMPRESSION: Successful catheter removal as described above. Read by: Brynda Greathouse PA-C Electronically Signed   By: Jacqulynn Cadet M.D.   On: 06/01/2020 15:09   IR US Guide Vasc Access Left  Result Date: 06/06/2020 INDICATION: 60 year old male with left axillary to axillary loop arteriovenous hemodialysis graft created 04/05/2020 presenting with graft thrombosis. EXAM: 1. Ultrasound-guided arteriovenous graft access x2 2. Pharmacomechanical thrombolysis of arteriovenous graft 3. Rheolytic thrombectomy of arteriovenous graft 4. Balloon sweep thrombectomy of arterial anastomosis 5. Balloon angioplasty of arteriovenous graft COMPARISON:  None. MEDICATIONS: None. CONTRAST:  70m OMNIPAQUE IOHEXOL 300 MG/ML  SOLN ANESTHESIA/SEDATION: Moderate (conscious) sedation was employed during this procedure. A total of Versed 1.5 mg and Fentanyl 75 mcg was administered  intravenously. Moderate Sedation Time: 99 minutes. The patient's level of consciousness and vital signs were monitored continuously by radiology nursing throughout the procedure under my direct supervision. FLUOROSCOPY TIME:  19 minutes, 18 seconds, 81 mGy COMPLICATIONS: None immediate. PROCEDURE: Informed written consent was obtained from the patient after a discussion of the risk, benefits and alternatives to treatment. Questions regarding the procedure were encouraged and answered. A timeout was performed prior to the initiation of the procedure. The left arm dialysis graft was prepped with Chlorhexidine in a sterile fashion, and a sterile drape was applied covering the operative field. Ultrasound evaluation of the entire graft was performed. Antegrade access near the graft apex was performed under ultrasound guidance with a micropuncture set. Over a Wholey wire, a 6 French sheath was placed. A Glidewire and angled catheter were directed to the left subclavian vein. Pull-back venogram was performed. Pharmacomechanical thrombolysis was performed of the venous outflow portion of the graft. Retrograde access was then obtained under ultrasound guidance with a micropuncture set. Over a Wholey wire, a 6 French sheath was placed. A Glidewire and angled catheter were directed to the axillary artery. Pharmacomechanical thrombolysis was performed in the arterial limb of the graft. Using a Fogarty balloon, balloon sweep was performed of the arterial anastomosis 3 times. Rheolytic thrombectomy was then performed throughout the arterial limb of the graft. This was followed by rheolytic therapy throughout the venous aspect of the graft. Fistulagram from the axillary artery demonstrated severe focal stenosis near the arterial anastomosis. Balloon angioplasty was performed at the stenotic site with a 4 mm x 2 cm mustang balloon. There is improved patency inflow after angioplasty. Additional moderate stenosis was noted in the  graft apex. Balloon angioplasty with  a 7 mm x 4 cm mustang balloon was performed. There are multifocal stenoses near the axillary vein anastomosis. Balloon angioplasty was performed multiple sites. Completion fistulagram was then performed. The sheaths was removed and hemostasis obtained with application of a 2-0 Ethilon pursestring suture which will be removed at the patient's next dialysis session. Dressings were placed. The patient tolerated the procedure well without immediate postprocedural complication. FINDINGS: Complete thrombosis of the left axillary to axillary loop arteriovenous graft. Technically successful pharmacomechanical thrombolysis and rheolytic thrombectomy throughout the graft. High-grade focal stenosis at the graft arterial anastomosis which improved after angioplasty. Additional angioplasty was required at the graft apex as well as the venous anastomosis. Self-limiting axillary hematoma was observed after wire cannulation cross the venous anastomosis. There is complete cessation of extravasation after balloon tamponade. Upon completion, there was palpable thrill throughout the graft and brisk antegrade flow on completion fistulogram. IMPRESSION: 1. Thrombosed left axillary to axillary loop arteriovenous graft. 2. Technically successful declot of graft with pharmacomechanical thrombolysis, rheolytic thrombectomy, Fogarty arterial anastomosis balloon sweep, and balloon angioplasty. ACCESS: This access remains amenable to future percutaneous interventions as clinically indicated. Ruthann Cancer, MD [facility] Rad[facility] Electronically Signed   By: Ruthann Cancer MD   On: 06/06/2020 17:42   IR US Guide Vasc Access Right  Result Date: 06/07/2020 INDICATION: History of end-stage renal disease post attempt at left upper arm declot (performed yesterday 06/06/2020) with early rethrombosis. Patient presents today for image guided placement of a tunneled  dialysis catheter. EXAM: TUNNELED CENTRAL VENOUS HEMODIALYSIS CATHETER PLACEMENT WITH ULTRASOUND AND FLUOROSCOPIC GUIDANCE MEDICATIONS: Ancef 2 gm IV . The antibiotic was given in an appropriate time interval prior to skin puncture. ANESTHESIA/SEDATION: Moderate (conscious) sedation was employed during this procedure. A total of Versed 0.5 mg and Fentanyl 25 mcg was administered intravenously. Moderate Sedation Time: 17 minutes. The patient's level of consciousness and vital signs were monitored continuously by radiology nursing throughout the procedure under my direct supervision. Comparisons: Image guided left upper arm dialysis graft thrombolysis-06/06/2020 FLUOROSCOPY TIME:  30 seconds (16 mGy) COMPLICATIONS: None immediate. PROCEDURE: Informed written consent was obtained from the patient after a discussion of the risks, benefits, and alternatives to treatment. Questions regarding the procedure were encouraged and answered. The right neck and chest were prepped with chlorhexidine in a sterile fashion, and a sterile drape was applied covering the operative field. Maximum barrier sterile technique with sterile gowns and gloves were used for the procedure. A timeout was performed prior to the initiation of the procedure. After creating a small venotomy incision, a micropuncture kit was utilized to access the internal jugular vein. Real-time ultrasound guidance was utilized for vascular access including the acquisition of a permanent ultrasound image documenting patency of the accessed vessel. The microwire was utilized to measure appropriate catheter length. A stiff Glidewire was advanced to the level of the IVC and the micropuncture sheath was exchanged for a peel-away sheath. A palindrome tunneled hemodialysis catheter measuring 19 cm from tip to cuff was tunneled in a retrograde fashion from the anterior chest wall to the venotomy incision. The catheter was then placed through the peel-away sheath with tips  ultimately positioned within the superior aspect of the right atrium. Final catheter positioning was confirmed and documented with a spot radiographic image. The catheter aspirates and flushes normally. The catheter was flushed with appropriate volume heparin dwells. The catheter exit site was secured with a 0-Prolene retention suture. The venotomy incision was closed with Dermabond and  Steri-strips. Dressings were applied. The patient tolerated the procedure well without immediate post procedural complication. IMPRESSION: Successful placement of 19 cm tip to cuff tunneled hemodialysis catheter via the right internal jugular vein with tips terminating within the superior aspect of the right atrium. The catheter is ready for immediate use. Electronically Signed   By: Sandi Mariscal M.D.   On: 06/07/2020 14:04   VAS US CAROTID  Result Date: 06/23/2020 Carotid Arterial Duplex Study Indications:       Convulsive syncope. Limitations        Today's exam was limited due to the high bifurcation of the                    carotid. Comparison Study:  TCD doppler study 06-22-2020 available. Performing Technologist: Darlin Coco RDMS,RVT  Examination Guidelines: A complete evaluation includes B-mode imaging, spectral Doppler, color Doppler, and power Doppler as needed of all accessible portions of each vessel. Bilateral testing is considered an integral part of a complete examination. Limited examinations for reoccurring indications may be performed as noted.  Right Carotid Findings: +----------+--------+--------+--------+------------------+--------+           PSV cm/sEDV cm/sStenosisPlaque DescriptionComments +----------+--------+--------+--------+------------------+--------+ CCA Prox  91      12                                         +----------+--------+--------+--------+------------------+--------+ CCA Distal68      19              heterogenous                +----------+--------+--------+--------+------------------+--------+ ICA Prox  58      17      1-39%   heterogenous               +----------+--------+--------+--------+------------------+--------+ ICA Distal84      28                                         +----------+--------+--------+--------+------------------+--------+ ECA       106     16                                         +----------+--------+--------+--------+------------------+--------+ +----------+--------+-------+----------------+-------------------+           PSV cm/sEDV cmsDescribe        Arm Pressure (mmHG) +----------+--------+-------+----------------+-------------------+ [id number]            Multiphasic, WNL                    +----------+--------+-------+----------------+-------------------+ +---------+--------+--+--------+--+---------+ VertebralPSV cm/s43EDV cm/s13Antegrade +---------+--------+--+--------+--+---------+  Left Carotid Findings: +----------+--------+--------+--------+------------------+--------+           PSV cm/sEDV cm/sStenosisPlaque DescriptionComments +----------+--------+--------+--------+------------------+--------+ CCA Prox  114     22                                         +----------+--------+--------+--------+------------------+--------+ CCA Distal76      18              heterogenous               +----------+--------+--------+--------+------------------+--------+  ICA Prox  50      17      1-39%   heterogenous               +----------+--------+--------+--------+------------------+--------+ ICA Distal83      33                                         +----------+--------+--------+--------+------------------+--------+ ECA       79      16                                         +----------+--------+--------+--------+------------------+--------+ +----------+--------+--------+----------------+-------------------+           PSV cm/sEDV  cm/sDescribe        Arm Pressure (mmHG) +----------+--------+--------+----------------+-------------------+ [id number]             Multiphasic, WNL                    +----------+--------+--------+----------------+-------------------+ +---------+--------+--+--------+--+---------+ VertebralPSV cm/s49EDV cm/s15Antegrade +---------+--------+--+--------+--+---------+   Summary: Right Carotid: Velocities in the right ICA are consistent with a 1-39% stenosis. Left Carotid: Velocities in the left ICA are consistent with a 1-39% stenosis. Vertebrals:  Bilateral vertebral arteries demonstrate antegrade flow. Subclavians: Normal flow hemodynamics were seen in bilateral subclavian              arteries. *See table(s) above for measurements and observations.  Electronically signed by Antony Contras MD on 06/23/2020 at 11:42:46 AM.    Final    VAS [place] TRANSCRANIAL DOPPLER  Result Date: 06/23/2020  Transcranial Doppler Indications: Convulsive syncope. Comparison Study: 06-22-2020 Carotid duplex study available. Performing Technologist: Darlin Coco RDMS,RVT  Examination Guidelines: A complete evaluation includes B-mode imaging, spectral Doppler, color Doppler, and power Doppler as needed of all accessible portions of each vessel. Bilateral testing is considered an integral part of a complete examination. Limited examinations for reoccurring indications may be performed as noted.  +----------+-------------+----------+-----------+-------+ RIGHT TCD Right VM (cm)Depth (cm)PulsatilityComment +----------+-------------+----------+-----------+-------+ MCA           90.00       5.60      1.03            +----------+-------------+----------+-----------+-------+ ACA           33.00       5.90      1.00            +----------+-------------+----------+-----------+-------+ Term ICA      60.00       6.80      0.87            +----------+-------------+----------+-----------+-------+ PCA            22.00       6.10      0.96            +----------+-------------+----------+-----------+-------+ Opthalmic     14.00       4.30      1.18            +----------+-------------+----------+-----------+-------+ ICA siphon    20.00       5.00      1.17            +----------+-------------+----------+-----------+-------+ Vertebral     30.00       5.90      1.11            +----------+-------------+----------+-----------+-------+  +----------+------------+----------+-----------+-------+  LEFT TCD  Left VM (cm)Depth (cm)PulsatilityComment +----------+------------+----------+-----------+-------+ MCA          66.00       4.40      1.16            +----------+------------+----------+-----------+-------+ ACA          32.00       5.60      1.13            +----------+------------+----------+-----------+-------+ Term ICA     63.00       5.20      0.93            +----------+------------+----------+-----------+-------+ PCA          15.00       5.40      0.53            +----------+------------+----------+-----------+-------+ Opthalmic    16.00       3.90      0.79            +----------+------------+----------+-----------+-------+ ICA siphon   15.00       5.60      0.84            +----------+------------+----------+-----------+-------+ Vertebral    23.00       6.60      0.86            +----------+------------+----------+-----------+-------+  +------------+-------+-------+             VM cm/sComment +------------+-------+-------+ Prox Basilar 25.00         +------------+-------+-------+ Summary:  Isolated mildly elevated right middle cerebral artery mean flow velocities of unclear significance. Normal mean flow velocities in remaining identified vessels of anterior and posterior cerebral circulations. *See table(s) above for TCD measurements and observations.  Diagnosing physician: Antony Contras MD Electronically signed by Antony Contras MD on 06/23/2020 at  11:49:49 AM.    Final    US ABDOMEN LIMITED RUQ (LIVER/GB)  Result Date: 06/13/2020 CLINICAL DATA:  Abdominal pain EXAM: ULTRASOUND ABDOMEN LIMITED RIGHT UPPER QUADRANT COMPARISON:  CT AP 06/12/2020 FINDINGS: Gallbladder: Status post cholecystectomy Common bile duct: Diameter: 3.3 mm Liver: No focal lesion identified. Within normal limits in parenchymal echogenicity. Portal vein is patent on color Doppler imaging with normal direction of blood flow towards the liver. Other: None. IMPRESSION: 1. Normal exam.  No acute findings. 2. Status post cholecystectomy. Electronically Signed   By: Kerby Moors M.D.   On: 06/13/2020 09:04        Discharge Exam: Vitals:   06/27/20 2123 06/28/20 0436  BP: (!) 185/105 (!) 173/98  Pulse: 83 87  Resp: 18 18  Temp: 98.1 F (36.7 C) 97.7 F (36.5 C)  SpO2: 100% 97%   Vitals:   06/27/20 1347 06/27/20 2123 06/28/20 0436 06/28/20 0500  BP: (!) 162/95 (!) 185/105 (!) 173/98   Pulse: 92 83 87   Resp: 20 18 18    Temp: 97.8 F (36.6 C) 98.1 F (36.7 C) 97.7 F (36.5 C)   TempSrc: Oral Oral Oral   SpO2: 95% 100% 97%   Weight:    122.3 kg  Height:        General: Pt is alert, awake, not in acute distress Cardiovascular: RRR, S1/S2 +, no rubs, no gallops Respiratory: CTA bilaterally, no wheezing, no rhonchi Abdominal: Soft, NT, ND, bowel sounds + Extremities: no edema, no cyanosis   The results of significant diagnostics from this hospitalization (including imaging, microbiology, ancillary and laboratory) are listed below for reference.  Significant Diagnostic Studies: CT ABDOMEN PELVIS WO CONTRAST  Result Date: 06/23/2020 CLINICAL DATA:  Abdominal pain.  History of recent pancreatitis EXAM: CT ABDOMEN AND PELVIS WITHOUT CONTRAST TECHNIQUE: Multidetector CT imaging of the abdomen and pelvis was performed following the standard protocol without oral or IV contrast. COMPARISON:  June 12, 2020 FINDINGS: Lower chest: There is bibasilar  atelectasis. There is mild lower lobe bronchiectatic change. There is no lung base edema or consolidation. Hepatobiliary: No focal liver lesions are appreciable on this noncontrast enhanced study. Gallbladder is absent. There is no appreciable biliary duct dilatation. Pancreas: Subtle edema of the pancreas appears stable compared to recent study. There is no pancreatic duct dilatation or pancreatic mass. There is no peripancreatic fluid or soft tissue stranding in the peripancreatic region. The appearance of the pancreas is stable compared to the recent prior study. Spleen: No splenic lesions are evident. Adrenals/Urinary Tract: Adrenals bilaterally appear normal. No evident renal mass or hydronephrosis on either side. There is no appreciable renal or ureteral calculus on either side. Urinary bladder is midline with wall thickness within normal limits. Stomach/Bowel: There is no appreciable bowel wall or mesenteric thickening. No appreciable bowel obstruction. The terminal ileum appears normal. There is no evident free air or portal venous air. Vascular/Lymphatic: No abdominal aortic aneurysm. There is aortic and iliac artery atherosclerotic calcification. Prominent left inguinal lymph nodes noted. The largest of these lymph nodes in the left inguinal region has a short axis diameter of 1.4 cm. There is also a prominent lymph node in the right inguinal region with a short axis diameter of 1.3 cm. No adenopathy evident elsewhere in the abdomen or pelvis. Reproductive: There are prostatic calculi. Prostate and seminal vesicles are normal in size and contour. Other: The appendix appears normal. No evident abscess or ascites in the abdomen or pelvis. There is fat in the right inguinal ring. Musculoskeletal: No blastic or lytic bone lesions. No intramuscular lesions. A mild degree of edema is noted in the abdominal and pelvic wall regions. IMPRESSION: 1. Stable subtle pancreatic edema, likely due to a degree of  developing pancreatitis. No pancreatic fluid or mass. No pancreatic duct dilatation. No new abnormality in the pancreas/peripancreatic region. 2. No bowel wall or mesenteric thickening. No bowel obstruction. No abscess in the abdomen or pelvis. Appendix appears normal. 3.  Gallbladder absent. 4. Mild inguinal adenopathy of uncertain etiology. Adenopathy of this nature potentially could have reactive etiology. 5. Mild edema in the abdominal and pelvic wall regions without associated fluid. Clinical significance of this finding uncertain. This finding may be due to early developing anasarca secondary to chronic renal failure. 6.  Mild lower lobe bronchiectatic change bilaterally. 7.  Aortic Atherosclerosis (ICD10-I70.0). Electronically Signed   By: Lowella Grip III M.D.   On: 06/23/2020 14:50   CT ABDOMEN PELVIS WO CONTRAST  Result Date: 06/12/2020 CLINICAL DATA:  Acute nonlocalized abdominal pain. Vomiting. Renal failure. Dialysis patient. EXAM: CT ABDOMEN AND PELVIS WITHOUT CONTRAST TECHNIQUE: Multidetector CT imaging of the abdomen and pelvis was performed following the standard protocol without IV contrast. COMPARISON:  12/14/2018 FINDINGS: Lower chest: Interstitial and early alveolar pattern in the lower lungs consistent with heart failure or fluid overload. No consolidation or collapse. No pleural effusion. Hepatobiliary: Liver parenchyma appears normal without contrast. Previous cholecystectomy. Pancreas: There appears to be mild swelling with surrounding edema in the region of the body of the pancreas suggesting early or mild pancreatitis. Spleen: Normal Adrenals/Urinary Tract: Adrenal glands are normal. Kidneys are normal. Bladder  is normal. Stomach/Bowel: Stomach and small intestine are normal. Normal appendix. No colon abnormality seen. Vascular/Lymphatic: Aortic atherosclerosis. No aneurysm. IVC is normal. No adenopathy. Reproductive: Normal Other: No free fluid or air. Musculoskeletal: Right  inguinal hernia containing fat. Mild lower lumbar degenerative changes. IMPRESSION: Lung bases show interstitial and early alveolar edema consistent with fluid overload. Mild swelling of the body of the pancreas with mild surrounding edema consistent with early/mild pancreatitis. Electronically Signed   By: Nelson Chimes M.D.   On: 06/12/2020 21:56   DG Chest 2 View  Result Date: 06/23/2020 CLINICAL DATA:  Epigastric pain. EXAM: CHEST - 2 VIEW COMPARISON:  04/25/2020 FINDINGS: Cardiopericardial silhouette is at upper limits of normal for size. Vascular congestion noted with diffuse interstitial opacities suggesting edema. No substantial pleural effusion. Right-sided IJ central line tip overlies the SVC/RA junction. Telemetry leads overlie the chest. IMPRESSION: Diffuse interstitial lung opacity suggests edema. Electronically Signed   By: Misty Stanley M.D.   On: 06/23/2020 14:22   IR Removal Tun Cv Cath W/O FL  Result Date: 06/01/2020 INDICATION: Patient with history of end-stage renal disease on dialysis via tunneled HD catheter placed 11/06/2019 by Dr. Earleen Newport. He now has a working AV graft and is no longer in need of his tunneled dialysis catheter. Request is made for removal. EXAM: REMOVAL TUNNELED CENTRAL VENOUS CATHETER MEDICATIONS: 10 mL 1% lidocaine ANESTHESIA/SEDATION: None FLUOROSCOPY TIME:  None COMPLICATIONS: None immediate. PROCEDURE: Informed written consent was obtained from the patient after a thorough discussion of the procedural risks, benefits and alternatives. All questions were addressed. Maximal Sterile Barrier Technique was utilized including caps, mask, sterile gowns, sterile gloves, sterile drape, hand hygiene and skin antiseptic. A timeout was performed prior to the initiation of the procedure. The patient's right chest and catheter was prepped and draped in a normal sterile fashion. Heparin was removed from both ports of catheter. 1% lidocaine was used for local anesthesia. Using  gentle blunt dissection the cuff of the catheter was exposed and the catheter was removed in its entirety. Pressure was held till hemostasis was obtained. A sterile dressing was applied. The patient tolerated the procedure well with no immediate complications. IMPRESSION: Successful catheter removal as described above. Read by: Brynda Greathouse PA-C Electronically Signed   By: Jacqulynn Cadet M.D.   On: 06/01/2020 15:09   IR US Guide Vasc Access Left  Result Date: 06/06/2020 INDICATION: 60 year old male with left axillary to axillary loop arteriovenous hemodialysis graft created 04/05/2020 presenting with graft thrombosis. EXAM: 1. Ultrasound-guided arteriovenous graft access x2 2. Pharmacomechanical thrombolysis of arteriovenous graft 3. Rheolytic thrombectomy of arteriovenous graft 4. Balloon sweep thrombectomy of arterial anastomosis 5. Balloon angioplasty of arteriovenous graft COMPARISON:  None. MEDICATIONS: None. CONTRAST:  67m OMNIPAQUE IOHEXOL 300 MG/ML  SOLN ANESTHESIA/SEDATION: Moderate (conscious) sedation was employed during this procedure. A total of Versed 1.5 mg and Fentanyl 75 mcg was administered intravenously. Moderate Sedation Time: 99 minutes. The patient's level of consciousness and vital signs were monitored continuously by radiology nursing throughout the procedure under my direct supervision. FLUOROSCOPY TIME:  19 minutes, 18 seconds, 81 mGy COMPLICATIONS: None immediate. PROCEDURE: Informed written consent was obtained from the patient after a discussion of the risk, benefits and alternatives to treatment. Questions regarding the procedure were encouraged and answered. A timeout was performed prior to the initiation of the procedure. The left arm dialysis graft was prepped with Chlorhexidine in a sterile fashion, and a sterile drape was applied covering the operative field. Ultrasound evaluation of the  entire graft was performed. Antegrade access near the graft apex was performed under  ultrasound guidance with a micropuncture set. Over a Wholey wire, a 6 French sheath was placed. A Glidewire and angled catheter were directed to the left subclavian vein. Pull-back venogram was performed. Pharmacomechanical thrombolysis was performed of the venous outflow portion of the graft. Retrograde access was then obtained under ultrasound guidance with a micropuncture set. Over a Wholey wire, a 6 French sheath was placed. A Glidewire and angled catheter were directed to the axillary artery. Pharmacomechanical thrombolysis was performed in the arterial limb of the graft. Using a Fogarty balloon, balloon sweep was performed of the arterial anastomosis 3 times. Rheolytic thrombectomy was then performed throughout the arterial limb of the graft. This was followed by rheolytic therapy throughout the venous aspect of the graft. Fistulagram from the axillary artery demonstrated severe focal stenosis near the arterial anastomosis. Balloon angioplasty was performed at the stenotic site with a 4 mm x 2 cm mustang balloon. There is improved patency inflow after angioplasty. Additional moderate stenosis was noted in the graft apex. Balloon angioplasty with a 7 mm x 4 cm mustang balloon was performed. There are multifocal stenoses near the axillary vein anastomosis. Balloon angioplasty was performed multiple sites. Completion fistulagram was then performed. The sheaths was removed and hemostasis obtained with application of a 2-0 Ethilon pursestring suture which will be removed at the patient's next dialysis session. Dressings were placed. The patient tolerated the procedure well without immediate postprocedural complication. FINDINGS: Complete thrombosis of the left axillary to axillary loop arteriovenous graft. Technically successful pharmacomechanical thrombolysis and rheolytic thrombectomy throughout the graft. High-grade focal stenosis at the graft arterial anastomosis which improved after angioplasty. Additional  angioplasty was required at the graft apex as well as the venous anastomosis. Self-limiting axillary hematoma was observed after wire cannulation cross the venous anastomosis. There is complete cessation of extravasation after balloon tamponade. Upon completion, there was palpable thrill throughout the graft and brisk antegrade flow on completion fistulogram. IMPRESSION: 1. Thrombosed left axillary to axillary loop arteriovenous graft. 2. Technically successful declot of graft with pharmacomechanical thrombolysis, rheolytic thrombectomy, Fogarty arterial anastomosis balloon sweep, and balloon angioplasty. ACCESS: This access remains amenable to future percutaneous interventions as clinically indicated. Ruthann Cancer, MD [facility] Electronically Signed   By: Ruthann Cancer MD   On: 06/06/2020 17:42   IR US Guide Vasc Access Right  Result Date: 06/07/2020 INDICATION: History of end-stage renal disease post attempt at left upper arm declot (performed yesterday 06/06/2020) with early rethrombosis. Patient presents today for image guided placement of a tunneled dialysis catheter. EXAM: TUNNELED CENTRAL VENOUS HEMODIALYSIS CATHETER PLACEMENT WITH ULTRASOUND AND FLUOROSCOPIC GUIDANCE MEDICATIONS: Ancef 2 gm IV . The antibiotic was given in an appropriate time interval prior to skin puncture. ANESTHESIA/SEDATION: Moderate (conscious) sedation was employed during this procedure. A total of Versed 0.5 mg and Fentanyl 25 mcg was administered intravenously. Moderate Sedation Time: 17 minutes. The patient's level of consciousness and vital signs were monitored continuously by radiology nursing throughout the procedure under my direct supervision. Comparisons: Image guided left upper arm dialysis graft thrombolysis-06/06/2020 FLUOROSCOPY TIME:  30 seconds (16 mGy) COMPLICATIONS: None immediate. PROCEDURE: Informed written consent was obtained from the patient after a  discussion of the risks, benefits, and alternatives to treatment. Questions regarding the procedure were encouraged and answered. The right neck and chest were prepped with chlorhexidine in a sterile fashion, and a sterile drape was applied covering the  operative field. Maximum barrier sterile technique with sterile gowns and gloves were used for the procedure. A timeout was performed prior to the initiation of the procedure. After creating a small venotomy incision, a micropuncture kit was utilized to access the internal jugular vein. Real-time ultrasound guidance was utilized for vascular access including the acquisition of a permanent ultrasound image documenting patency of the accessed vessel. The microwire was utilized to measure appropriate catheter length. A stiff Glidewire was advanced to the level of the IVC and the micropuncture sheath was exchanged for a peel-away sheath. A palindrome tunneled hemodialysis catheter measuring 19 cm from tip to cuff was tunneled in a retrograde fashion from the anterior chest wall to the venotomy incision. The catheter was then placed through the peel-away sheath with tips ultimately positioned within the superior aspect of the right atrium. Final catheter positioning was confirmed and documented with a spot radiographic image. The catheter aspirates and flushes normally. The catheter was flushed with appropriate volume heparin dwells. The catheter exit site was secured with a 0-Prolene retention suture. The venotomy incision was closed with Dermabond and Steri-strips. Dressings were applied. The patient tolerated the procedure well without immediate post procedural complication. IMPRESSION: Successful placement of 19 cm tip to cuff tunneled hemodialysis catheter via the right internal jugular vein with tips terminating within the superior aspect of the right atrium. The catheter is ready for immediate use. Electronically Signed   By: Sandi Mariscal M.D.   On: 06/07/2020 14:04    VAS US CAROTID  Result Date: 06/23/2020 Carotid Arterial Duplex Study Indications:       Convulsive syncope. Limitations        Today's exam was limited due to the high bifurcation of the                    carotid. Comparison Study:  TCD doppler study 06-22-2020 available. Performing Technologist: Darlin Coco RDMS,RVT  Examination Guidelines: A complete evaluation includes B-mode imaging, spectral Doppler, color Doppler, and power Doppler as needed of all accessible portions of each vessel. Bilateral testing is considered an integral part of a complete examination. Limited examinations for reoccurring indications may be performed as noted.  Right Carotid Findings: +----------+--------+--------+--------+------------------+--------+           PSV cm/sEDV cm/sStenosisPlaque DescriptionComments +----------+--------+--------+--------+------------------+--------+ CCA Prox  91      12                                         +----------+--------+--------+--------+------------------+--------+ CCA Distal68      19              heterogenous               +----------+--------+--------+--------+------------------+--------+ ICA Prox  58      17      1-39%   heterogenous               +----------+--------+--------+--------+------------------+--------+ ICA Distal84      28                                         +----------+--------+--------+--------+------------------+--------+ ECA       106     16                                         +----------+--------+--------+--------+------------------+--------+ +----------+--------+-------+----------------+-------------------+  PSV cm/sEDV cmsDescribe        Arm Pressure (mmHG) +----------+--------+-------+----------------+-------------------+ [id number]            Multiphasic, WNL                    +----------+--------+-------+----------------+-------------------+ +---------+--------+--+--------+--+---------+  VertebralPSV cm/s43EDV cm/s13Antegrade +---------+--------+--+--------+--+---------+  Left Carotid Findings: +----------+--------+--------+--------+------------------+--------+           PSV cm/sEDV cm/sStenosisPlaque DescriptionComments +----------+--------+--------+--------+------------------+--------+ CCA Prox  114     22                                         +----------+--------+--------+--------+------------------+--------+ CCA Distal76      18              heterogenous               +----------+--------+--------+--------+------------------+--------+ ICA Prox  50      17      1-39%   heterogenous               +----------+--------+--------+--------+------------------+--------+ ICA Distal83      33                                         +----------+--------+--------+--------+------------------+--------+ ECA       79      16                                         +----------+--------+--------+--------+------------------+--------+ +----------+--------+--------+----------------+-------------------+           PSV cm/sEDV cm/sDescribe        Arm Pressure (mmHG) +----------+--------+--------+----------------+-------------------+ [id number]             Multiphasic, WNL                    +----------+--------+--------+----------------+-------------------+ +---------+--------+--+--------+--+---------+ VertebralPSV cm/s49EDV cm/s15Antegrade +---------+--------+--+--------+--+---------+   Summary: Right Carotid: Velocities in the right ICA are consistent with a 1-39% stenosis. Left Carotid: Velocities in the left ICA are consistent with a 1-39% stenosis. Vertebrals:  Bilateral vertebral arteries demonstrate antegrade flow. Subclavians: Normal flow hemodynamics were seen in bilateral subclavian              arteries. *See table(s) above for measurements and observations.  Electronically signed by Antony Contras MD on 06/23/2020 at 11:42:46 AM.    Final     VAS [place] TRANSCRANIAL DOPPLER  Result Date: 06/23/2020  Transcranial Doppler Indications: Convulsive syncope. Comparison Study: 06-22-2020 Carotid duplex study available. Performing Technologist: Darlin Coco RDMS,RVT  Examination Guidelines: A complete evaluation includes B-mode imaging, spectral Doppler, color Doppler, and power Doppler as needed of all accessible portions of each vessel. Bilateral testing is considered an integral part of a complete examination. Limited examinations for reoccurring indications may be performed as noted.  +----------+-------------+----------+-----------+-------+ RIGHT TCD Right VM (cm)Depth (cm)PulsatilityComment +----------+-------------+----------+-----------+-------+ MCA           90.00       5.60      1.03            +----------+-------------+----------+-----------+-------+ ACA           33.00       5.90      1.00            +----------+-------------+----------+-----------+-------+  Term ICA      60.00       6.80      0.87            +----------+-------------+----------+-----------+-------+ PCA           22.00       6.10      0.96            +----------+-------------+----------+-----------+-------+ Opthalmic     14.00       4.30      1.18            +----------+-------------+----------+-----------+-------+ ICA siphon    20.00       5.00      1.17            +----------+-------------+----------+-----------+-------+ Vertebral     30.00       5.90      1.11            +----------+-------------+----------+-----------+-------+  +----------+------------+----------+-----------+-------+ LEFT TCD  Left VM (cm)Depth (cm)PulsatilityComment +----------+------------+----------+-----------+-------+ MCA          66.00       4.40      1.16            +----------+------------+----------+-----------+-------+ ACA          32.00       5.60      1.13            +----------+------------+----------+-----------+-------+ Term ICA      63.00       5.20      0.93            +----------+------------+----------+-----------+-------+ PCA          15.00       5.40      0.53            +----------+------------+----------+-----------+-------+ Opthalmic    16.00       3.90      0.79            +----------+------------+----------+-----------+-------+ ICA siphon   15.00       5.60      0.84            +----------+------------+----------+-----------+-------+ Vertebral    23.00       6.60      0.86            +----------+------------+----------+-----------+-------+  +------------+-------+-------+             VM cm/sComment +------------+-------+-------+ Prox Basilar 25.00         +------------+-------+-------+ Summary:  Isolated mildly elevated right middle cerebral artery mean flow velocities of unclear significance. Normal mean flow velocities in remaining identified vessels of anterior and posterior cerebral circulations. *See table(s) above for TCD measurements and observations.  Diagnosing physician: Antony Contras MD Electronically signed by Antony Contras MD on 06/23/2020 at 11:49:49 AM.    Final    US ABDOMEN LIMITED RUQ (LIVER/GB)  Result Date: 06/13/2020 CLINICAL DATA:  Abdominal pain EXAM: ULTRASOUND ABDOMEN LIMITED RIGHT UPPER QUADRANT COMPARISON:  CT AP 06/12/2020 FINDINGS: Gallbladder: Status post cholecystectomy Common bile duct: Diameter: 3.3 mm Liver: No focal lesion identified. Within normal limits in parenchymal echogenicity. Portal vein is patent on color Doppler imaging with normal direction of blood flow towards the liver. Other: None. IMPRESSION: 1. Normal exam.  No acute findings. 2. Status post cholecystectomy. Electronically Signed   By: Kerby Moors M.D.   On: 06/13/2020 09:04     Microbiology: Recent Results (from the past 240 hour(s))  Resp Panel by RT-PCR (Flu A&B, Covid) Nasopharyngeal Swab     Status: None   Collection Time: 06/23/20  3:19 PM   Specimen: Nasopharyngeal Swab;  Nasopharyngeal(NP) swabs in vial transport medium  Result Value Ref Range Status   SARS Coronavirus 2 by RT PCR NEGATIVE NEGATIVE Final    Comment: (NOTE) SARS-CoV-2 target nucleic acids are NOT DETECTED.  The SARS-CoV-2 RNA is generally detectable in upper respiratory specimens during the acute phase of infection. The lowest concentration of SARS-CoV-2 viral copies this assay can detect is 138 copies/mL. A negative result does not preclude SARS-Cov-2 infection and should not be used as the sole basis for treatment or other patient management decisions. A negative result may occur with  improper specimen collection/handling, submission of specimen other than nasopharyngeal swab, presence of viral mutation(s) within the areas targeted by this assay, and inadequate number of viral copies(<138 copies/mL). A negative result must be combined with clinical observations, patient history, and epidemiological information. The expected result is Negative.  Fact Sheet for Patients:  EntrepreneurPulse.com.au  Fact Sheet for Healthcare Providers:  IncredibleEmployment.be  This test is no t yet approved or cleared by the [place] FDA and  has been authorized for detection and/or diagnosis of SARS-CoV-2 by FDA under an Emergency Use Authorization (EUA). This EUA will remain  in effect (meaning this test can be used) for the duration of the COVID-19 declaration under Section 564(b)(1) of the Act, 21 U.S.C.section 360bbb-3(b)(1), unless the authorization is terminated  or revoked sooner.       Influenza A by PCR NEGATIVE NEGATIVE Final   Influenza B by PCR NEGATIVE NEGATIVE Final    Comment: (NOTE) The Xpert Xpress SARS-CoV-2/FLU/RSV plus assay is intended as an aid in the diagnosis of influenza from Nasopharyngeal swab specimens and should not be used as a sole basis for treatment. Nasal washings and aspirates are unacceptable for Xpert Xpress  SARS-CoV-2/FLU/RSV testing.  Fact Sheet for Patients: EntrepreneurPulse.com.au  Fact Sheet for Healthcare Providers: IncredibleEmployment.be  This test is not yet approved or cleared by the [place] FDA and has been authorized for detection and/or diagnosis of SARS-CoV-2 by FDA under an Emergency Use Authorization (EUA). This EUA will remain in effect (meaning this test can be used) for the duration of the COVID-19 declaration under Section 564(b)(1) of the Act, 21 U.S.C. section 360bbb-3(b)(1), unless the authorization is terminated or revoked.  Performed at [facility], [address] [place], [place] [address]      Labs: Basic Metabolic Panel: Recent Labs  Lab 06/23/20 1200 06/24/20 0616 06/25/20 0613 06/26/20 0530  NA 134* 139 140 136  K 3.8 3.6 3.7 4.1  CL 100 106 107 104  CO2 24 24 23 23   GLUCOSE 304* 125* 100* 172*  BUN 34* 31* 30* 21*  CREATININE 3.88* 3.90* 4.01* 3.50*  CALCIUM 9.0 8.5* 8.5* 8.1*  MG 1.9  --   --   --    Liver Function Tests: Recent Labs  Lab 06/23/20 1200 06/25/20 0613 06/26/20 0530  AST 11* 17 19  ALT 9 15 12   ALKPHOS 165* 181* 169*  BILITOT 0.4 0.7 0.5  PROT 7.2 6.5 6.6  ALBUMIN 2.9* 2.5* 2.5*   Recent Labs  Lab 06/23/20 1200 06/25/20 0613 06/26/20 0530  LIPASE 210* 42 34   No results for input(s): AMMONIA in the last 168 hours. CBC: Recent Labs  Lab 06/23/20 1200 06/24/20 0616  WBC 6.2 4.8  HGB 12.2* 11.6*  HCT 40.0 38.3*  MCV 86.4 86.7  PLT 292 260   Cardiac Enzymes: No results for input(s): CKTOTAL, CKMB, CKMBINDEX, TROPONINI in the last 168 hours. BNP: Invalid input(s): POCBNP CBG: Recent Labs  Lab 06/27/20 0715 06/27/20 1201 06/27/20 1549 06/27/20 2119 06/28/20 0743  GLUCAP 131* 228* 220* 160* 173*    Time coordinating discharge:  36 minutes  Signed:  Orson Eva, DO [facility] Pager: [phone] 06/28/2020, 9:52 AM

## 2020-06-27 NOTE — Evaluation (Signed)
Physical Therapy Evaluation Patient Details Name: Zachary Young MRN: [id number] DOB: Feb 17, 1961 Today's Date: 06/27/2020   History of Present Illness  Zachary Young is a 60 y.o. male with medical history significant for ESRD, hypertension, congestive heart failure, diabetes, cocaine use, right BKA.  Patient presented to the ED with complaints of upper abdominal pain that started about 4 AM this morning.  Describes abdominal pain as sharp and stabbing.  Reports multiple episodes of vomiting today.  Denies diarrhea.  Reports similar abdominal pain when he had his episodes of acute pancreatitis recently.  He denies significant alcohol intake, and has not drunk any alcohol beverage in at least 9 months.  Patient was scheduled to get dialyzed today, but this did not happen due to his abdominal pain.    Clinical Impression  Patient functioning at baseline for functional mobility and using wheelchair for mobility.  Patient states he did not pursue gait training after Right BKA due to satisfied using wheelchair for mobility.  Patient demonstrates good return for transferring to w/c and propelling self in hallways.  Plan:  Patient discharged from physical therapy to care of nursing for out of bed daily as tolerated for length of stay.     Follow Up Recommendations No PT follow up;Supervision - Intermittent    Equipment Recommendations  None recommended by PT    Recommendations for Other Services       Precautions / Restrictions Precautions Precautions: Fall Restrictions Weight Bearing Restrictions: No      Mobility  Bed Mobility Overal bed mobility: Modified Independent                  Transfers Overall transfer level: Modified independent               General transfer comment: using armrest of wheelchair  Ambulation/Gait                Holiday representative Wheelchair mobility: Yes Wheelchair propulsion: Both upper  extremities;Left lower extremity Wheelchair parts: Independent Distance: 200 Wheelchair Assistance Details (indicate cue type and reason): demonstrates good return for propelling self in wheelchair on level, declined and inclined surfaces without assist  Modified Rankin (Stroke Patients Only)       Balance Overall balance assessment: Needs assistance Sitting-balance support: Feet supported;No upper extremity supported;Feet unsupported Sitting balance-Leahy Scale: Good Sitting balance - Comments: seated at EOB   Standing balance support: During functional activity;Bilateral upper extremity supported Standing balance-Leahy Scale: Poor Standing balance comment: has to lean on armrest of w/c due to right BKA                             Pertinent Vitals/Pain Pain Assessment: No/denies pain    Home Living Family/patient expects to be discharged to:: Private residence Living Arrangements: Non-relatives/Friends Available Help at Discharge: Family;Available PRN/intermittently Type of Home: Apartment Home Access: Ramped entrance     Home Layout: One level Home Equipment: Wheelchair - manual;Wheelchair - power      Prior Function                 Hand Dominance   Dominant Hand: Right    Extremity/Trunk Assessment   Upper Extremity Assessment Upper Extremity Assessment: Overall WFL for tasks assessed    Lower Extremity Assessment Lower Extremity Assessment: Overall WFL for tasks assessed    Cervical / Trunk Assessment Cervical / Trunk  Assessment: Normal  Communication   Communication: No difficulties  Cognition Arousal/Alertness: Awake/alert Behavior During Therapy: WFL for tasks assessed/performed Overall Cognitive Status: Within Functional Limits for tasks assessed                                        General Comments      Exercises     Assessment/Plan    PT Assessment Patent does not need any further PT services  PT  Problem List         PT Treatment Interventions      PT Goals (Current goals can be found in the Care Plan section)  Acute Rehab PT Goals Patient Stated Goal: return home with family and friends to assist PT Goal Formulation: With patient Time For Goal Achievement: 06/27/20 Potential to Achieve Goals: Good    Frequency     Barriers to discharge        Co-evaluation               AM-PAC PT "6 Clicks" Mobility  Outcome Measure Help needed turning from your back to your side while in a flat bed without using bedrails?: None Help needed moving from lying on your back to sitting on the side of a flat bed without using bedrails?: None Help needed moving to and from a bed to a chair (including a wheelchair)?: None Help needed standing up from a chair using your arms (e.g., wheelchair or bedside chair)?: None Help needed to walk in hospital room?: A Lot Help needed climbing 3-5 steps with a railing? : A Lot 6 Click Score: 20    End of Session   Activity Tolerance: Patient tolerated treatment well;Patient limited by fatigue Patient left: in chair;with call bell/phone within reach (left seated in wheelchair) Nurse Communication: Mobility status PT Visit Diagnosis: Unsteadiness on feet (R26.81);Other abnormalities of gait and mobility (R26.89);Difficulty in walking, not elsewhere classified (R26.2)    Time: 1000-1020 PT Time Calculation (min) (ACUTE ONLY): 20 min   Charges:   PT Evaluation $PT Eval Moderate Complexity: 1 Mod PT Treatments $Therapeutic Activity: 8-22 mins        11:35 AM, 06/27/20 Lonell Grandchild, MPT Physical Therapist with [facility] [phone] office [phone] mobile phone

## 2020-06-27 NOTE — Consult Note (Signed)
Reason for Consult: To manage dialysis and dialysis related needs Referring Physician: Tat  Jadakiss Kump is an 60 y.o. male with past medical history significant for type 2 diabetes mellitus, hypertension, coronary artery disease, PAD status post BKA, tobacco use/abuse as well as ESRD-TTS at [facility].  He has had an issue of late with pancreatitis-status post hospitalizations in late February and early March, both of which he left AGAINST MEDICAL ADVICE-discharged on 3/4.  He presented to hospital again on 3/10 with complaints of abdominal pain and swelling.  There was controversy whether his last dialysis was 3/8, or 3/10.  Patient underwent dialysis here on Saturday 3/12 with 2 L removed.  He remains in hospital today and has been changed to inpatient status.  We are asked to see him to provide his routine dialysis and manage his dialysis related medications.  Pain control appears to be an issue   Dialyzes at [place] 124 kg.  TTS, 4 hours 15 minutes, 400 blood flow HD [facility] 3/2.5, Dialyzer unknown, Heparin 1500 loading, then 1500/h. Access TDC-has access that is clotted-has missed appointments to manage.  EPo 2600 per treatment  Past Medical History:  Diagnosis Date  . CHF (congestive heart failure) ([place])   . Diabetes mellitus without complication ([place])   . Dialysis patient ([place])   . Peripheral edema   . Renal disorder    kidney disease    Past Surgical History:  Procedure Laterality Date  . AMPUTATION Right 10/21/2019   Procedure: RIGHT BELOW KNEE AMPUTATION;  Surgeon: Newt Minion, MD;  Location: [place];  Service: Orthopedics;  Laterality: Right;  . AV FISTULA PLACEMENT Left 11/13/2019   Procedure: LEFT ARTERIOVENOUS (AV) FISTULA VERSUS ARTERIOVENOUS GRAFT;  Surgeon: Rosetta Posner, MD;  Location: [facility] OR;  Service: Vascular;  Laterality: Left;  . AV FISTULA PLACEMENT Left 04/05/2020   Procedure: INSERTION OF LEFT ARM ARTERIOVENOUS (AV) GORE-TEX GRAFT;  Surgeon:  Rosetta Posner, MD;  Location: [place];  Service: Vascular;  Laterality: Left;  . BASCILIC VEIN TRANSPOSITION Left 01/27/2020   Procedure: LEFT ARM 2ND STAGE BASCILIC VEIN TRANSPOSITION;  Surgeon: Rosetta Posner, MD;  Location: [place];  Service: Vascular;  Laterality: Left;  . DIALYSIS/PERMA CATHETER INSERTION N/A 03/03/2020   Procedure: DIALYSIS/PERMA CATHETER INSERTION;  Surgeon: Algernon Huxley, MD;  Location: [place] CV LAB;  Service: Cardiovascular;  Laterality: N/A;  . ESOPHAGOGASTRODUODENOSCOPY (EGD) WITH PROPOFOL N/A 06/17/2020   Procedure: ESOPHAGOGASTRODUODENOSCOPY (EGD) WITH PROPOFOL;  Surgeon: Eloise Harman, DO;  [facility];  Service: Endoscopy;  Laterality: N/A;  . INSERTION OF DIALYSIS CATHETER N/A 04/05/2020   Procedure: INSERTION OF TUNNEL  DIALYSIS CATHETER LEFT INTERNAL JUGULAR;  Surgeon: Rosetta Posner, MD;  Location: [place];  Service: Vascular;  Laterality: N/A;  . IR FLUORO GUIDE CV LINE RIGHT  11/05/2019  . IR FLUORO GUIDE CV LINE RIGHT  06/07/2020  . IR REMOVAL TUN CV CATH W/O FL  06/01/2020  . IR THROMBECTOMY AV FISTULA W/THROMBOLYSIS/PTA INC/SHUNT/IMG LEFT Left 06/06/2020  . IR US GUIDE VASC ACCESS LEFT  06/06/2020  . IR US GUIDE VASC ACCESS RIGHT  11/05/2019  . IR US GUIDE VASC ACCESS RIGHT  06/07/2020  . REMOVAL OF A DIALYSIS CATHETER Right 04/05/2020   Procedure: REMOVAL OF RIGHT CHEST DIALYSIS CATHETER;  Surgeon: Rosetta Posner, MD;  Location: [place];  Service: Vascular;  Laterality: Right;  . TOE AMPUTATION Bilateral    due to osteomyelitis, all 5 each foot  Family History  Problem Relation Age of Onset  . Cancer Mother        uknown type of cancer  . Colon cancer Neg Hx   . Colon polyps Neg Hx     Social History:  reports that he has been smoking cigarettes. He has been smoking about 0.50 packs per day. He has never used smokeless tobacco. He reports previous alcohol use. He reports previous drug use. Drug: Cocaine.  Allergies:  Allergies  Allergen  Reactions  . Bee Venom Anaphylaxis    Per pt, nearly died from bee sting as a child  . Propofol Other (See Comments)     Hallucinations  . Morphine Nausea And Vomiting  . Oxycodone Nausea And Vomiting    Medications: I have reviewed the patient's current medications.   Results for orders placed or performed during the hospital encounter of 06/23/20 (from the past 48 hour(s))  Glucose, capillary     Status: None   Collection Time: 06/25/20  7:38 AM  Result Value Ref Range   Glucose-Capillary 86 70 - 99 mg/dL    Comment: Glucose reference range applies only to samples taken after fasting for at least 8 hours.  Glucose, capillary     Status: None   Collection Time: 06/25/20 11:24 AM  Result Value Ref Range   Glucose-Capillary 87 70 - 99 mg/dL    Comment: Glucose reference range applies only to samples taken after fasting for at least 8 hours.  Glucose, capillary     Status: Abnormal   Collection Time: 06/25/20  4:14 PM  Result Value Ref Range   Glucose-Capillary 110 (H) 70 - 99 mg/dL    Comment: Glucose reference range applies only to samples taken after fasting for at least 8 hours.  Glucose, capillary     Status: Abnormal   Collection Time: 06/25/20  8:09 PM  Result Value Ref Range   Glucose-Capillary 126 (H) 70 - 99 mg/dL    Comment: Glucose reference range applies only to samples taken after fasting for at least 8 hours.  Comprehensive metabolic panel     Status: Abnormal   Collection Time: 06/26/20  5:30 AM  Result Value Ref Range   Sodium 136 135 - 145 mmol/L   Potassium 4.1 3.5 - 5.1 mmol/L   Chloride 104 98 - 111 mmol/L   CO2 23 22 - 32 mmol/L   Glucose, Bld 172 (H) 70 - 99 mg/dL    Comment: Glucose reference range applies only to samples taken after fasting for at least 8 hours.   BUN 21 (H) 6 - 20 mg/dL   Creatinine, Ser 3.50 (H) 0.61 - 1.24 mg/dL   Calcium 8.1 (L) 8.9 - 10.3 mg/dL   Total Protein 6.6 6.5 - 8.1 g/dL   Albumin 2.5 (L) 3.5 - 5.0 g/dL   AST 19 15 -  41 U/L   ALT 12 0 - 44 U/L   Alkaline Phosphatase 169 (H) 38 - 126 U/L   Total Bilirubin 0.5 0.3 - 1.2 mg/dL   GFR, Estimated 20 (L) >60 mL/min    Comment: (NOTE) Calculated using the CKD-EPI Creatinine Equation (2021)    Anion gap 9 5 - 15    Comment: Performed at [facility], [address], [place], [place] [address]  Lipase, blood     Status: None   Collection Time: 06/26/20  5:30 AM  Result Value Ref Range   Lipase 34 11 - 51 U/L    Comment: Performed at [facility], [address], [place], [place] [address]  Glucose, capillary     Status: Abnormal   Collection Time: 06/26/20  7:30 AM  Result Value Ref Range   Glucose-Capillary 148 (H) 70 - 99 mg/dL    Comment: Glucose reference range applies only to samples taken after fasting for at least 8 hours.  Glucose, capillary     Status: Abnormal   Collection Time: 06/26/20 11:22 AM  Result Value Ref Range   Glucose-Capillary 148 (H) 70 - 99 mg/dL    Comment: Glucose reference range applies only to samples taken after fasting for at least 8 hours.  Glucose, capillary     Status: Abnormal   Collection Time: 06/26/20  4:10 PM  Result Value Ref Range   Glucose-Capillary 243 (H) 70 - 99 mg/dL    Comment: Glucose reference range applies only to samples taken after fasting for at least 8 hours.  Glucose, capillary     Status: Abnormal   Collection Time: 06/26/20  9:21 PM  Result Value Ref Range   Glucose-Capillary 200 (H) 70 - 99 mg/dL    Comment: Glucose reference range applies only to samples taken after fasting for at least 8 hours.  Glucose, capillary     Status: Abnormal   Collection Time: 06/27/20  7:15 AM  Result Value Ref Range   Glucose-Capillary 131 (H) 70 - 99 mg/dL    Comment: Glucose reference range applies only to samples taken after fasting for at least 8 hours.    No results found.  ROS:  "got fluid on"  abd pain improved  Blood pressure (!) 185/98, pulse 86, temperature (!) 97.5 F (36.4 C), temperature source  Oral, resp. rate 19, height '6\' 3"'$  (1.905 m), weight 122.1 kg, SpO2 95 %. General appearance: alert, mild distress and morbidly obese Resp: clear to auscultation bilaterally Cardio: regular rate and rhythm, S1, S2 normal, no murmur, click, rub or gallop GI: obese, tender to deep palpation Extremities: edema 1+-  right BKA and left TMA left AVG clotted-  also with TDC-  no evidence of infection  Assessment/Plan: 60 year old BM with multiple medical issues including ESRD.  Hospitalized with pancreatitis 1 pancreatitis-seems to be clinically improving.  Management per primary team 2 ESRD: Normally TTS at [facility].  We will plan on routine dialysis tomorrow here.  Will make sure that Dr. Donnetta Hutching has been informed that patient is in-house to see if his graft could be declotted at this point, however, we are now over a week out so unclear if it will 3 Hypertension: Blood pressure for the most part high.  Has been ordered Norvasc here.  Do not believe he takes as an outpatient, midodrine is actually on his list.  Will challenge with next dialysis.  He also is prescribed diuretics normally as outpatient-we will add his torsemide back 4. Anemia of ESRD: Last hemoglobin check over 11.  He receives low-dose ESA with dialysis- holding here so far while hgb is OK  5. Metabolic Bone Disease: Is not really eating at this time so will hold his Renagel.  He does not receive any active vitamin D or Sensipar with dialysis   Louis Meckel 06/27/2020, 8:17 AM

## 2020-06-28 ENCOUNTER — Telehealth: Payer: Self-pay

## 2020-06-28 ENCOUNTER — Ambulatory Visit (HOSPITAL_COMMUNITY): Payer: Medicaid Other | Attending: Neurology

## 2020-06-28 DIAGNOSIS — I10 Essential (primary) hypertension: Secondary | ICD-10-CM | POA: Diagnosis not present

## 2020-06-28 DIAGNOSIS — E1165 Type 2 diabetes mellitus with hyperglycemia: Secondary | ICD-10-CM | POA: Diagnosis not present

## 2020-06-28 DIAGNOSIS — K859 Acute pancreatitis without necrosis or infection, unspecified: Secondary | ICD-10-CM | POA: Diagnosis not present

## 2020-06-28 DIAGNOSIS — N186 End stage renal disease: Secondary | ICD-10-CM | POA: Diagnosis not present

## 2020-06-28 LAB — GLUCOSE, CAPILLARY
Glucose-Capillary: 173 mg/dL — ABNORMAL HIGH (ref 70–99)
Glucose-Capillary: 166 mg/dL — ABNORMAL HIGH (ref 70–99)

## 2020-06-28 MED ORDER — TORSEMIDE 100 MG PO TABS: 100.0000 mg | ORAL_TABLET | Freq: Every day | ORAL | 1 refills | Status: AC

## 2020-06-28 MED ORDER — ATORVASTATIN CALCIUM 20 MG PO TABS: 20.0000 mg | ORAL_TABLET | Freq: Every day | ORAL | 1 refills | Status: AC

## 2020-06-28 MED ORDER — AMLODIPINE BESYLATE 5 MG PO TABS: 5.0000 mg | ORAL_TABLET | Freq: Every day | ORAL | 1 refills | Status: AC

## 2020-06-28 NOTE — Telephone Encounter (Signed)
Received call from Dr. Clover Mealy about patient's left arm AVG needing a de-clot. Discussed with Dr. Donnetta Hutching - declot is likely to fail - may put patient on for AP-OR Right arm AVF versus AVG in the next few weeks per TFE.

## 2020-06-28 NOTE — Progress Notes (Signed)
Subjective:  Mild behavioral issues overnight-  BP high-  Had 1700 of UOP- due for dialysis later today. Says pain is better-  Possibly getting close to discharge....today ?  Objective Vital signs in last 24 hours: Vitals:   06/27/20 1347 06/27/20 2123 06/28/20 0436 06/28/20 0500  BP: (!) 162/95 (!) 185/105 (!) 173/98   Pulse: 92 83 87   Resp: '20 18 18   '$ Temp: 97.8 F (36.6 C) 98.1 F (36.7 C) 97.7 F (36.5 C)   TempSrc: Oral Oral Oral   SpO2: 95% 100% 97%   Weight:    122.3 kg  Height:       Weight change: 0.2 kg  Intake/Output Summary (Last 24 hours) at 06/28/2020 0837 Last data filed at 06/28/2020 0700 Gross per 24 hour  Intake -  Output 2250 ml  Net -2250 ml    Dialyzes at [place] 124 kg.  TTS, 4 hours 15 minutes, 400 blood flow HD Bath 3/2.5, Dialyzer unknown, Heparin 1500 loading, then 1500/h. Access TDC-has access that is clotted-has missed appointments to manage.  EPo 2600 per treatment  Assessment/Plan: 60 year old BM with multiple medical issues including ESRD.  Hospitalized with pancreatitis 1 pancreatitis-seems to be clinically improving.  Management per primary team-  Possible for discharge soon 2 ESRD: Normally TTS at [facility].  We will plan on routine dialysis today here.  Will make sure that Dr. Donnetta Hutching has been informed that patient needs his graft declotted at some point, however, we are now over a week out so unclear if it will be successful 3 Hypertension: Blood pressure for the most part high.  Has been ordered Norvasc here.  Do not believe he takes as an outpatient, midodrine is actually on his list ?  Will challenge with dialysis.  He also is prescribed diuretics normally as outpatient- added his torsemide back with good results- is under EDW  4. Anemia of ESRD: Last hemoglobin check over 11.  He receives low-dose ESA with dialysis- holding here so far while hgb is OK  5. Metabolic Bone Disease: Is not really eating at this time so will  hold his Renagel.  He does not receive any active vitamin D or Sensipar with dialysis    Louis Meckel    Labs: Basic Metabolic Panel: Recent Labs  Lab 06/24/20 0616 06/25/20 0613 06/26/20 0530  NA 139 140 136  K 3.6 3.7 4.1  CL 106 107 104  CO2 '24 23 23  '$ GLUCOSE 125* 100* 172*  BUN 31* 30* 21*  CREATININE 3.90* 4.01* 3.50*  CALCIUM 8.5* 8.5* 8.1*   Liver Function Tests: Recent Labs  Lab 06/23/20 1200 06/25/20 0613 06/26/20 0530  AST 11* 17 19  ALT '9 15 12  '$ ALKPHOS 165* 181* 169*  BILITOT 0.4 0.7 0.5  PROT 7.2 6.5 6.6  ALBUMIN 2.9* 2.5* 2.5*   Recent Labs  Lab 06/23/20 1200 06/25/20 0613 06/26/20 0530  LIPASE 210* 42 34   No results for input(s): AMMONIA in the last 168 hours. CBC: Recent Labs  Lab 06/23/20 1200 06/24/20 0616  WBC 6.2 4.8  HGB 12.2* 11.6*  HCT 40.0 38.3*  MCV 86.4 86.7  PLT 292 260   Cardiac Enzymes: No results for input(s): CKTOTAL, CKMB, CKMBINDEX, TROPONINI in the last 168 hours. CBG: Recent Labs  Lab 06/27/20 0715 06/27/20 1201 06/27/20 1549 06/27/20 2119 06/28/20 0743  GLUCAP 131* 228* 220* 160* 173*    Iron Studies: No results for input(s): IRON, TIBC, TRANSFERRIN,  FERRITIN in the last 72 hours. Studies/Results: No results found. Medications: Infusions: . sodium chloride    . sodium chloride      Scheduled Medications: . amLODipine  5 mg Oral Daily  . atorvastatin  20 mg Oral Q supper  . Chlorhexidine Gluconate Cloth  6 each Topical Daily  . Chlorhexidine Gluconate Cloth  6 each Topical Q0600  . Chlorhexidine Gluconate Cloth  6 each Topical Q0600  . heparin  5,000 Units Subcutaneous Q8H  . insulin aspart  0-6 Units Subcutaneous TID WC  . nicotine  14 mg Transdermal Daily  . pantoprazole (PROTONIX) IV  40 mg Intravenous Q24H  . phenytoin  300 mg Oral QHS  . torsemide  100 mg Oral Q M,W,F-HD    have reviewed scheduled and prn medications.  Physical Exam: General: NAD Heart: RRR Lungs: mostly  clear Abdomen: obese, soft, non tender Extremities: some edema Dialysis Access: TDC and clotted AVG on left     06/28/2020,8:37 AM  LOS: 4 days

## 2020-06-28 NOTE — Telephone Encounter (Signed)
-----   Message from Garvin Fila, MD sent at 06/23/2020  4:52 PM EST ----- Kindly inform the patient that carotid ultrasound study showed no significant narrowing of either carotid arteries in the neck.

## 2020-06-28 NOTE — Telephone Encounter (Signed)
Noted, thank you

## 2020-06-28 NOTE — Telephone Encounter (Signed)
-----   Message from Garvin Fila, MD sent at 06/23/2020  4:53 PM EST ----- Kindly inform the patient that blood flow velocities in majority of the blood vessels was normal.  The slightly higher in one of the blood vessels on the right side towards the temple but this is not a worrisome finding.

## 2020-06-28 NOTE — Progress Notes (Signed)
Pt has discharge orders, discharge teaching given and no further questions at this time. Dialysis complete for today, pt wheeled down to main entrance via w/c to vehicle accompanied by friend of the family.

## 2020-06-28 NOTE — Telephone Encounter (Signed)
I called patient.  I advised him that his carotid ultrasound showed no significant narrowing.  I advised him that his transcranial Doppler showed normal flow velocities in majority of the blood vessels.  Patient reports that he is in the hospital currently.  He will not be able to have his MRI scheduled today as ordered by Dr. Leonie Man.  Patient verbalized understanding of results.  Patient had no further questions or concerns.

## 2020-06-29 ENCOUNTER — Other Ambulatory Visit: Payer: Medicaid Other

## 2020-07-04 ENCOUNTER — Ambulatory Visit: Payer: Medicaid Other | Admitting: Vascular Surgery

## 2020-07-07 ENCOUNTER — Other Ambulatory Visit: Payer: Self-pay

## 2020-07-11 ENCOUNTER — Encounter (HOSPITAL_COMMUNITY)
Admission: RE | Admit: 2020-07-11 | Discharge: 2020-07-11 | Disposition: A | Discharge status: Home / Self Care | Payer: Medicaid Other | Source: Ambulatory Visit | Attending: Vascular Surgery | Admitting: Vascular Surgery

## 2020-07-11 ENCOUNTER — Other Ambulatory Visit: Payer: Self-pay

## 2020-07-12 ENCOUNTER — Other Ambulatory Visit (HOSPITAL_COMMUNITY): Admission: RE | Admit: 2020-07-12 | Payer: Medicaid Other | Source: Ambulatory Visit

## 2020-07-12 ENCOUNTER — Inpatient Hospital Stay (HOSPITAL_COMMUNITY): Admission: RE | Admit: 2020-07-12 | Payer: Medicaid Other | Source: Ambulatory Visit

## 2020-07-13 ENCOUNTER — Other Ambulatory Visit (HOSPITAL_COMMUNITY)
Admission: RE | Admit: 2020-07-13 | Discharge: 2020-07-13 | Disposition: A | Discharge status: Home / Self Care | Payer: Medicaid Other | Source: Ambulatory Visit | Attending: Vascular Surgery | Admitting: Vascular Surgery

## 2020-07-13 ENCOUNTER — Inpatient Hospital Stay (HOSPITAL_COMMUNITY): Admission: RE | Admit: 2020-07-13 | Payer: Medicaid Other | Source: Ambulatory Visit

## 2020-07-13 ENCOUNTER — Other Ambulatory Visit: Payer: Self-pay

## 2020-07-13 DIAGNOSIS — Z01812 Encounter for preprocedural laboratory examination: Secondary | ICD-10-CM | POA: Diagnosis present

## 2020-07-13 DIAGNOSIS — Z20822 Contact with and (suspected) exposure to covid-19: Secondary | ICD-10-CM | POA: Diagnosis not present

## 2020-07-13 LAB — SARS CORONAVIRUS 2 (TAT 6-24 HRS): SARS Coronavirus 2: NEGATIVE

## 2020-07-14 ENCOUNTER — Ambulatory Visit (HOSPITAL_COMMUNITY)
Admission: RE | Admit: 2020-07-14 | Discharge: 2020-07-14 | Disposition: A | Discharge status: Home / Self Care | Payer: Medicaid Other | Source: Ambulatory Visit | Attending: Vascular Surgery | Admitting: Vascular Surgery

## 2020-07-14 ENCOUNTER — Ambulatory Visit (HOSPITAL_COMMUNITY): Payer: Medicaid Other | Admitting: Anesthesiology

## 2020-07-14 ENCOUNTER — Encounter (HOSPITAL_COMMUNITY)
Admission: RE | Disposition: A | Discharge status: Home / Self Care | Payer: Self-pay | Source: Ambulatory Visit | Attending: Vascular Surgery

## 2020-07-14 ENCOUNTER — Encounter (HOSPITAL_COMMUNITY): Payer: Self-pay | Admitting: Vascular Surgery

## 2020-07-14 DIAGNOSIS — E1122 Type 2 diabetes mellitus with diabetic chronic kidney disease: Secondary | ICD-10-CM | POA: Insufficient documentation

## 2020-07-14 DIAGNOSIS — Z992 Dependence on renal dialysis: Secondary | ICD-10-CM | POA: Insufficient documentation

## 2020-07-14 DIAGNOSIS — Z888 Allergy status to other drugs, medicaments and biological substances status: Secondary | ICD-10-CM | POA: Insufficient documentation

## 2020-07-14 DIAGNOSIS — N185 Chronic kidney disease, stage 5: Secondary | ICD-10-CM

## 2020-07-14 DIAGNOSIS — N186 End stage renal disease: Secondary | ICD-10-CM | POA: Insufficient documentation

## 2020-07-14 DIAGNOSIS — Z7982 Long term (current) use of aspirin: Secondary | ICD-10-CM | POA: Diagnosis not present

## 2020-07-14 DIAGNOSIS — Y841 Kidney dialysis as the cause of abnormal reaction of the patient, or of later complication, without mention of misadventure at the time of the procedure: Secondary | ICD-10-CM | POA: Insufficient documentation

## 2020-07-14 DIAGNOSIS — T82868A Thrombosis of vascular prosthetic devices, implants and grafts, initial encounter: Secondary | ICD-10-CM | POA: Insufficient documentation

## 2020-07-14 DIAGNOSIS — Z885 Allergy status to narcotic agent status: Secondary | ICD-10-CM | POA: Diagnosis not present

## 2020-07-14 DIAGNOSIS — Z794 Long term (current) use of insulin: Secondary | ICD-10-CM | POA: Diagnosis not present

## 2020-07-14 DIAGNOSIS — F17210 Nicotine dependence, cigarettes, uncomplicated: Secondary | ICD-10-CM | POA: Diagnosis not present

## 2020-07-14 DIAGNOSIS — Z79899 Other long term (current) drug therapy: Secondary | ICD-10-CM | POA: Diagnosis not present

## 2020-07-14 DIAGNOSIS — Z91030 Bee allergy status: Secondary | ICD-10-CM | POA: Diagnosis not present

## 2020-07-14 HISTORY — PX: AV FISTULA PLACEMENT: SHX1204

## 2020-07-14 LAB — POCT I-STAT, CHEM 8
BUN: 52 mg/dL — ABNORMAL HIGH (ref 6–20)
Calcium, Ion: 0.85 mmol/L — CL (ref 1.15–1.40)
Chloride: 103 mmol/L (ref 98–111)
Creatinine, Ser: 4.9 mg/dL — ABNORMAL HIGH (ref 0.61–1.24)
Glucose, Bld: 133 mg/dL — ABNORMAL HIGH (ref 70–99)
HCT: 39 % (ref 39.0–52.0)
Hemoglobin: 13.3 g/dL (ref 13.0–17.0)
Potassium: 4.6 mmol/L (ref 3.5–5.1)
Sodium: 136 mmol/L (ref 135–145)
TCO2: 23 mmol/L (ref 22–32)

## 2020-07-14 LAB — GLUCOSE, CAPILLARY: Glucose-Capillary: 91 mg/dL (ref 70–99)

## 2020-07-14 SURGERY — ARTERIOVENOUS (AV) FISTULA CREATION
Anesthesia: General | Site: Arm Lower | Laterality: Right

## 2020-07-14 MED ORDER — FENTANYL CITRATE (PF) 100 MCG/2ML IJ SOLN
INTRAMUSCULAR | Status: AC
  Filled 2020-07-14: qty 2

## 2020-07-14 MED ORDER — CHLORHEXIDINE GLUCONATE 0.12 % MT SOLN
OROMUCOSAL | Status: AC
  Filled 2020-07-14: qty 15

## 2020-07-14 MED ORDER — 0.9 % SODIUM CHLORIDE (POUR BTL) OPTIME
TOPICAL | Status: DC | PRN
  Administered 2020-07-14: 1000 mL

## 2020-07-14 MED ORDER — OXYCODONE-ACETAMINOPHEN 5-325 MG PO TABS: 1.0000 | ORAL_TABLET | Freq: Four times a day (QID) | ORAL | 0 refills | Status: DC | PRN

## 2020-07-14 MED ORDER — CEFAZOLIN SODIUM-DEXTROSE 2-4 GM/100ML-% IV SOLN
2.0000 g | INTRAVENOUS | Status: AC
  Administered 2020-07-14: 2 g via INTRAVENOUS

## 2020-07-14 MED ORDER — PROPOFOL 10 MG/ML IV BOLUS
INTRAVENOUS | Status: AC
  Filled 2020-07-14: qty 20

## 2020-07-14 MED ORDER — MIDAZOLAM HCL 2 MG/2ML IJ SOLN
INTRAMUSCULAR | Status: DC | PRN
  Administered 2020-07-14: 1 mg via INTRAVENOUS

## 2020-07-14 MED ORDER — ORAL CARE MOUTH RINSE: 15.0000 mL | Freq: Once | OROMUCOSAL | Status: AC

## 2020-07-14 MED ORDER — PROPOFOL 10 MG/ML IV BOLUS
INTRAVENOUS | Status: DC | PRN
  Administered 2020-07-14: 10 mg via INTRAVENOUS
  Administered 2020-07-14: 30 mg via INTRAVENOUS
  Administered 2020-07-14: 20 mg via INTRAVENOUS
  Administered 2020-07-14: 30 mg via INTRAVENOUS
  Administered 2020-07-14: 50 mg via INTRAVENOUS
  Administered 2020-07-14: 20 mg via INTRAVENOUS

## 2020-07-14 MED ORDER — CHLORHEXIDINE GLUCONATE 4 % EX LIQD: 60.0000 mL | Freq: Once | CUTANEOUS | Status: DC

## 2020-07-14 MED ORDER — HEPARIN SOD (PORK) LOCK FLUSH 100 UNIT/ML IV SOLN
160.0000 [IU] | Freq: Once | INTRAVENOUS | Status: DC
  Filled 2020-07-14: qty 5

## 2020-07-14 MED ORDER — PHENYLEPHRINE HCL (PRESSORS) 10 MG/ML IV SOLN
INTRAVENOUS | Status: DC | PRN
  Administered 2020-07-14: 80 ug via INTRAVENOUS

## 2020-07-14 MED ORDER — FENTANYL CITRATE (PF) 100 MCG/2ML IJ SOLN
25.0000 ug | INTRAMUSCULAR | Status: DC | PRN
  Administered 2020-07-14: 50 ug via INTRAVENOUS

## 2020-07-14 MED ORDER — CEFAZOLIN SODIUM-DEXTROSE 2-4 GM/100ML-% IV SOLN
INTRAVENOUS | Status: AC
  Filled 2020-07-14: qty 100

## 2020-07-14 MED ORDER — KETAMINE HCL 10 MG/ML IJ SOLN
INTRAMUSCULAR | Status: DC | PRN
  Administered 2020-07-14 (×2): 15 mg via INTRAVENOUS

## 2020-07-14 MED ORDER — LIDOCAINE HCL (CARDIAC) PF 100 MG/5ML IV SOSY
PREFILLED_SYRINGE | INTRAVENOUS | Status: DC | PRN
  Administered 2020-07-14: 60 mg via INTRATRACHEAL

## 2020-07-14 MED ORDER — SODIUM CHLORIDE 0.9 % IV SOLN
INTRAVENOUS | Status: DC
  Administered 2020-07-14: 1000 mL via INTRAVENOUS

## 2020-07-14 MED ORDER — PHENYLEPHRINE HCL (PRESSORS) 10 MG/ML IV SOLN
INTRAVENOUS | Status: AC
  Filled 2020-07-14: qty 1

## 2020-07-14 MED ORDER — LIDOCAINE HCL (PF) 1 % IJ SOLN
INTRAMUSCULAR | Status: AC
  Filled 2020-07-14: qty 2

## 2020-07-14 MED ORDER — PHENYLEPHRINE 40 MCG/ML (10ML) SYRINGE FOR IV PUSH (FOR BLOOD PRESSURE SUPPORT)
PREFILLED_SYRINGE | INTRAVENOUS | Status: AC
  Filled 2020-07-14: qty 10

## 2020-07-14 MED ORDER — LIDOCAINE-EPINEPHRINE 0.5 %-1:200000 IJ SOLN
INTRAMUSCULAR | Status: DC | PRN
  Administered 2020-07-14: 13 mL

## 2020-07-14 MED ORDER — KETAMINE HCL 50 MG/5ML IJ SOSY
PREFILLED_SYRINGE | INTRAMUSCULAR | Status: AC
  Filled 2020-07-14: qty 5

## 2020-07-14 MED ORDER — CHLORHEXIDINE GLUCONATE 0.12 % MT SOLN
15.0000 mL | Freq: Once | OROMUCOSAL | Status: AC
  Administered 2020-07-14: 15 mL via OROMUCOSAL

## 2020-07-14 MED ORDER — SODIUM CHLORIDE FLUSH 0.9 % IV SOLN
INTRAVENOUS | Status: AC
  Filled 2020-07-14: qty 10

## 2020-07-14 MED ORDER — MIDAZOLAM HCL 2 MG/2ML IJ SOLN
INTRAMUSCULAR | Status: AC
  Filled 2020-07-14: qty 2

## 2020-07-14 MED ORDER — SODIUM CHLORIDE 0.9 % IV SOLN
INTRAVENOUS | Status: DC | PRN
  Administered 2020-07-14: 60 mL

## 2020-07-14 MED ORDER — HEPARIN SODIUM (PORCINE) 1000 UNIT/ML IJ SOLN
1600.0000 [IU] | Freq: Once | INTRAMUSCULAR | Status: AC
  Administered 2020-07-14: 1600 [IU]
  Filled 2020-07-14: qty 1.6

## 2020-07-14 MED ORDER — FENTANYL CITRATE (PF) 100 MCG/2ML IJ SOLN
INTRAMUSCULAR | Status: DC | PRN
  Administered 2020-07-14 (×4): 25 ug via INTRAVENOUS

## 2020-07-14 MED ORDER — PHENYLEPHRINE HCL-NACL 10-0.9 MG/250ML-% IV SOLN
INTRAVENOUS | Status: DC | PRN
  Administered 2020-07-14: 25 ug/min via INTRAVENOUS

## 2020-07-14 MED ORDER — PROPOFOL 500 MG/50ML IV EMUL
INTRAVENOUS | Status: DC | PRN
  Administered 2020-07-14: 25 ug/kg/min via INTRAVENOUS

## 2020-07-14 MED ORDER — HEPARIN SODIUM (PORCINE) 1000 UNIT/ML IJ SOLN
INTRAMUSCULAR | Status: AC
  Filled 2020-07-14: qty 6

## 2020-07-14 SURGICAL SUPPLY — 40 items
ADH SKN CLS APL DERMABOND .7 (GAUZE/BANDAGES/DRESSINGS) ×1
ARMBAND PINK RESTRICT EXTREMIT (MISCELLANEOUS) ×2 IMPLANT
BAG HAMPER (MISCELLANEOUS) ×2 IMPLANT
CANNULA VESSEL 3MM 2 BLNT TIP (CANNULA) ×2 IMPLANT
CLIP LIGATING EXTRA MED SLVR (CLIP) ×2 IMPLANT
CLIP LIGATING EXTRA SM BLUE (MISCELLANEOUS) ×2 IMPLANT
COVER LIGHT HANDLE STERIS (MISCELLANEOUS) ×4 IMPLANT
COVER MAYO STAND XLG (MISCELLANEOUS) ×2 IMPLANT
COVER WAND RF STERILE (DRAPES) ×2 IMPLANT
DECANTER SPIKE VIAL GLASS SM (MISCELLANEOUS) ×2 IMPLANT
DERMABOND ADVANCED (GAUZE/BANDAGES/DRESSINGS) ×1
DERMABOND ADVANCED .7 DNX12 (GAUZE/BANDAGES/DRESSINGS) ×1 IMPLANT
ELECT REM PT RETURN 9FT ADLT (ELECTROSURGICAL) ×2
ELECTRODE REM PT RTRN 9FT ADLT (ELECTROSURGICAL) ×1 IMPLANT
GAUZE SPONGE 4X4 12PLY STRL (GAUZE/BANDAGES/DRESSINGS) ×2 IMPLANT
GLOVE SS BIOGEL STRL SZ 7.5 (GLOVE) ×1 IMPLANT
GLOVE SUPERSENSE BIOGEL SZ 7.5 (GLOVE) ×1
GLOVE SURG UNDER POLY LF SZ7 (GLOVE) ×6 IMPLANT
GOWN STRL REUS W/TWL LRG LVL3 (GOWN DISPOSABLE) ×6 IMPLANT
GRAFT GORETEX STRT 4-7X45 (Vascular Products) ×2 IMPLANT
IV NS 500ML (IV SOLUTION) ×2
IV NS 500ML BAXH (IV SOLUTION) ×1 IMPLANT
KIT BLADEGUARD II DBL (SET/KITS/TRAYS/PACK) ×2 IMPLANT
KIT TURNOVER KIT A (KITS) ×2 IMPLANT
MANIFOLD NEPTUNE II (INSTRUMENTS) ×2 IMPLANT
MARKER SKIN DUAL TIP RULER LAB (MISCELLANEOUS) ×4 IMPLANT
NEEDLE HYPO 18GX1.5 BLUNT FILL (NEEDLE) ×2 IMPLANT
NS IRRIG 1000ML POUR BTL (IV SOLUTION) ×2 IMPLANT
PACK CV ACCESS (CUSTOM PROCEDURE TRAY) ×2 IMPLANT
PAD ARMBOARD 7.5X6 YLW CONV (MISCELLANEOUS) ×4 IMPLANT
SET BASIN LINEN APH (SET/KITS/TRAYS/PACK) ×2 IMPLANT
SOL PREP POV-IOD 4OZ 10% (MISCELLANEOUS) ×2 IMPLANT
SOL PREP PROV IODINE SCRUB 4OZ (MISCELLANEOUS) ×2 IMPLANT
SUT PROLENE 6 0 CC (SUTURE) ×4 IMPLANT
SUT SILK 2 0 SH (SUTURE) ×2 IMPLANT
SUT VIC AB 3-0 SH 27 (SUTURE) ×4
SUT VIC AB 3-0 SH 27X BRD (SUTURE) ×2 IMPLANT
SYR 10ML LL (SYRINGE) ×2 IMPLANT
SYR CONTROL 10ML LL (SYRINGE) ×2 IMPLANT
UNDERPAD 30X36 HEAVY ABSORB (UNDERPADS AND DIAPERS) ×2 IMPLANT

## 2020-07-14 NOTE — Op Note (Signed)
    OPERATIVE REPORT  DATE OF SURGERY: 07/14/2020  PATIENT: Zachary Young, 60 y.o. male MRN: [id number]  DOB: May 08, 1960  PRE-OPERATIVE DIAGNOSIS: Stage renal disease  POST-OPERATIVE DIAGNOSIS:  Same  PROCEDURE: Right forearm loop AV Gore-Tex graft  SURGEON:  Curt Jews, M.D.  PHYSICIAN ASSISTANT: Fulton Mole, RNFA  The assistant was needed for exposure and to expedite the case  ANESTHESIA: Local with sedation  EBL: per anesthesia record  Total I/O In: 800 [I.V.:700; IV Piggyback:100] Out: 260 [Urine:250; Blood:10]  BLOOD ADMINISTERED: none  DRAINS: none  SPECIMEN: none  COUNTS CORRECT:  YES  PATIENT DISPOSITION:  PACU - hemodynamically stable  PROCEDURE DETAILS: The patient was taken operating placed supine position where the area of the right arm prepped draped you sterile fashion.  SonoSite ultrasound was used to visualize the veins.  The patient had a very small and adequate cephalic vein.  The basilic vein above the elbow was of moderate caliber.  On further into the mid upper arm there was extensive branching with the main portion being apparently thrombosed.  There were large branches surrounding this and the vein did appear to be adequate for outflow.  The brachial artery had a high bifurcation was to brachial artery was noted at the antecubital space.  The larger was more deep and medial.  Using local anesthesia incision made over the antecubital space and carried down to isolate both the brachial arteries.  The deeper was much larger caliber.  The superficial 1 was much smaller.  The basilic vein was identified through a separate incision just above the antecubital space in the medial aspect of the arm and the vein was of good size.  The area of several branches coming together were isolated and these were controlled and the vein above this was larger.  Separate incision was made over the distal forearm and a loop configuration tunnel was created.  The a 4 x 7  tapered Gore-Tex graft was brought through the tunnel.  The brachial artery was occluded proximally distally and was opened with 11 blade sent off to Pott scissors.  The 4 mm portion of the graft was spatulated to approximately 5 mm segment and was sewn end-to-side to the artery with a running 6-0 Prolene suture.  This anastomosis was tested and found to be adequate.  The graft was flushed with heparinized saline and reoccluded.  Next the basilic vein was occluded proximally distally and was opened with 11 blade Celestia Potts scissors.  The graft was cut to the appropriate length and 7 mm portion was sewn end-to-side to the basilic vein with a running 6-0 Prolene suture.  Clamps removed and good thrill was noted.  Wounds irrigated with saline.  Hemostasis obtained with cautery.  The wounds were closed with 3-0 Vicryl in the subcutaneous and subcuticular tissue.  Sterile dressing was applied and the patient was transferred to the recovery room in stable condition.  Patient maintained a palpable right radial pulse.   Rosetta Posner, M.D., [facility] 07/14/2020 1:41 PM  Note: Portions of this report may have been transcribed using voice recognition software.  Every effort has been made to ensure accuracy; however, inadvertent computerized transcription errors may still be present.

## 2020-07-14 NOTE — Discharge Instructions (Signed)
[facility]  Discharge Instructions  AV Fistula or Graft Surgery for Dialysis Access  Please refer to the following instructions for your post-procedure care. Your surgeon or physician assistant will discuss any changes with you.  Activity  You may drive the day following your surgery, if you are comfortable and no longer taking prescription pain medication. Resume full activity as the soreness in your incision resolves.  Bathing/Showering  You may shower after you go home. Keep your incision dry for 48 hours. Do not soak in a bathtub, hot tub, or swim until the incision heals completely. You may not shower if you have a hemodialysis catheter.  Incision Care  Clean your incision with mild soap and water after 48 hours. Pat the area dry with a clean towel. You do not need a bandage unless otherwise instructed. Do not apply any ointments or creams to your incision. You may have skin glue on your incision. Do not peel it off. It will come off on its own in about one week. Your arm may swell a bit after surgery. To reduce swelling use pillows to elevate your arm so it is above your heart. Your doctor will tell you if you need to lightly wrap your arm with an ACE bandage.  Diet  Resume your normal diet. There are not special food restrictions following this procedure. In order to heal from your surgery, it is CRITICAL to get adequate nutrition. Your body requires vitamins, minerals, and protein. Vegetables are the best source of vitamins and minerals. Vegetables also provide the perfect balance of protein. Processed food has little nutritional value, so try to avoid this.  Medications  Resume taking all of your medications. If your incision is causing pain, you may take over-the counter pain relievers such as acetaminophen (Tylenol). If you were prescribed a stronger pain medication, please be aware these medications can cause nausea and constipation. Prevent  nausea by taking the medication with a snack or meal. Avoid constipation by drinking plenty of fluids and eating foods with high amount of fiber, such as fruits, vegetables, and grains.  Do not take Tylenol if you are taking prescription pain medications.  Follow up Your surgeon may want to see you in the office following your access surgery. If so, this will be arranged at the time of your surgery.  Please call us immediately for any of the following conditions:  . Increased pain, redness, drainage (pus) from your incision site . Fever of 101 degrees or higher . Severe or worsening pain at your incision site . Hand pain or numbness. .  Reduce your risk of vascular disease:  . Stop smoking. If you would like help, call [facility] at 1-800-QUIT-NOW (872) 161-8835) or [place] at [phone]  . Manage your cholesterol . Maintain a desired weight . Control your diabetes . Keep your blood pressure down  Dialysis  It will take several weeks to several months for your new dialysis access to be ready for use. Your surgeon will determine when it is okay to use it. Your nephrologist will continue to direct your dialysis. You can continue to use your Permcath until your new access is ready for use.   07/14/2020 Zachary Young [id number] 09-Dec-1960  Surgeon(s): Early, Arvilla Meres, MD  Procedure(s): RIGHT ARM ARTERIOVENOUS (AV) GRAFT CREATION   May stick graft immediately   May stick graft on designated area only:    Do not stick graft for 4 weeks    If you  have any questions, please call the office at [phone].      General Anesthesia, Adult, Care After This sheet gives you information about how to care for yourself after your procedure. Your health care provider may also give you more specific instructions. If you have problems or questions, contact your health care provider. What can I expect after the procedure? After the procedure, the following side effects are  common:  Pain or discomfort at the IV site.  Nausea.  Vomiting.  Sore throat.  Trouble concentrating.  Feeling cold or chills.  Feeling weak or tired.  Sleepiness and fatigue.  Soreness and body aches. These side effects can affect parts of the body that were not involved in surgery. Follow these instructions at home: For the time period you were told by your health care provider:  Rest.  Do not participate in activities where you could fall or become injured.  Do not drive or use machinery.  Do not drink alcohol.  Do not take sleeping pills or medicines that cause drowsiness.  Do not make important decisions or sign legal documents.  Do not take care of children on your own.   Eating and drinking  Follow any instructions from your health care provider about eating or drinking restrictions.  When you feel hungry, start by eating small amounts of foods that are soft and easy to digest (bland), such as toast. Gradually return to your regular diet.  Drink enough fluid to keep your urine pale yellow.  If you vomit, rehydrate by drinking water, juice, or clear broth. General instructions  If you have sleep apnea, surgery and certain medicines can increase your risk for breathing problems. Follow instructions from your health care provider about wearing your sleep device: ? Anytime you are sleeping, including during daytime naps. ? While taking prescription pain medicines, sleeping medicines, or medicines that make you drowsy.  Have a responsible adult stay with you for the time you are told. It is important to have someone help care for you until you are awake and alert.  Return to your normal activities as told by your health care provider. Ask your health care provider what activities are safe for you.  Take over-the-counter and prescription medicines only as told by your health care provider.  If you smoke, do not smoke without supervision.  Keep all follow-up  visits as told by your health care provider. This is important. Contact a health care provider if:  You have nausea or vomiting that does not get better with medicine.  You cannot eat or drink without vomiting.  You have pain that does not get better with medicine.  You are unable to pass urine.  You develop a skin rash.  You have a fever.  You have redness around your IV site that gets worse. Get help right away if:  You have difficulty breathing.  You have chest pain.  You have blood in your urine or stool, or you vomit blood. Summary  After the procedure, it is common to have a sore throat or nausea. It is also common to feel tired.  Have a responsible adult stay with you for the time you are told. It is important to have someone help care for you until you are awake and alert.  When you feel hungry, start by eating small amounts of foods that are soft and easy to digest (bland), such as toast. Gradually return to your regular diet.  Drink enough fluid to keep your urine  pale yellow.  Return to your normal activities as told by your health care provider. Ask your health care provider what activities are safe for you. This information is not intended to replace advice given to you by your health care provider. Make sure you discuss any questions you have with your health care provider. Document Revised: 12/17/2019 Document Reviewed: 07/16/2019 Elsevier Patient Education  2021 [employer].

## 2020-07-14 NOTE — H&P (Signed)
Zachary Robins, MD  Physician  Vascular Surgery  Consult Note     Signed  Date of Service:  06/03/2020 7:51 PM           Signed      Expand AllCollapse All      Show:Clear all '[x]'$ Manual'[x]'$ Template'[]'$ Copied  Added by: '[x]'$ Zachary Robins, MD   '[]'$ Hover for details      ASSESSMENT & PLAN:  60 y.o. male with thrombosed LUE axillary loop AVG. No urgent indication for thrombectomy from my standpoint.  Recommend checking chemistry panel and discussing with nephrology if patient needs urgent dialysis. Recommend discussion with IR service on Monday for percutaneous thrombectomy of AVG. If access cannot be salvaged, will be happy to place new access.  CHIEF COMPLAINT:   AVG thrombosed.  HISTORY:  HISTORY OF PRESENT ILLNESS: Zachary Young is a 60 y.o. male well known to our service. He has had multiple failed access surgeries. He is currently dialyzing through a left upper extremity axillary loop AVG placed by Zachary Young on 04/05/20. He received 3.5 hours of a planned 4 hour treatment before his access thrombosed. He underwent treatments Monday and Wednesday this week. He reports no shortness of breath.       Past Medical History:  Diagnosis Date  . CHF (congestive heart failure) ([place])   . Diabetes mellitus without complication ([place])   . Peripheral edema   . Renal disorder    kidney disease         Past Surgical History:  Procedure Laterality Date  . AMPUTATION Right 10/21/2019   Procedure: RIGHT BELOW KNEE AMPUTATION;  Surgeon: Zachary Minion, MD;  Location: [facility];  Service: Orthopedics;  Laterality: Right;  . AV FISTULA PLACEMENT Left 11/13/2019   Procedure: LEFT ARTERIOVENOUS (AV) FISTULA VERSUS ARTERIOVENOUS GRAFT;  Surgeon: Zachary Posner, MD;  Location: [facility];  Service: Vascular;  Laterality: Left;  . AV FISTULA PLACEMENT Left 04/05/2020   Procedure: INSERTION OF LEFT ARM ARTERIOVENOUS (AV) GORE-TEX GRAFT;  Surgeon: Zachary Posner, MD;   Location: [place];  Service: Vascular;  Laterality: Left;  . BASCILIC VEIN TRANSPOSITION Left 01/27/2020   Procedure: LEFT ARM 2ND STAGE BASCILIC VEIN TRANSPOSITION;  Surgeon: Zachary Posner, MD;  Location: [place];  Service: Vascular;  Laterality: Left;  . DIALYSIS/PERMA CATHETER INSERTION N/A 03/03/2020   Procedure: DIALYSIS/PERMA CATHETER INSERTION;  Surgeon: Zachary Huxley, MD;  Location: [facility];  Service: Cardiovascular;  Laterality: N/A;  . INSERTION OF DIALYSIS CATHETER N/A 04/05/2020   Procedure: INSERTION OF TUNNEL  DIALYSIS CATHETER LEFT INTERNAL JUGULAR;  Surgeon: Zachary Posner, MD;  Location: [place];  Service: Vascular;  Laterality: N/A;  . IR FLUORO GUIDE CV LINE RIGHT  11/05/2019  . IR REMOVAL TUN CV CATH W/O FL  06/01/2020  . IR US GUIDE VASC ACCESS RIGHT  11/05/2019  . REMOVAL OF A DIALYSIS CATHETER Right 04/05/2020   Procedure: REMOVAL OF RIGHT CHEST DIALYSIS CATHETER;  Surgeon: Zachary Posner, MD;  Location: [place];  Service: Vascular;  Laterality: Right;  . TOE AMPUTATION Bilateral    due to osteomyelitis, all 5 each foot         Family History  Problem Relation Age of Onset  . Cancer Mother     Social History        Socioeconomic History  . Marital status: Single    Spouse name: Not on file  . Number of children: Not on file  .  Years of education: Not on file  . Highest education level: Not on file  Occupational History  . Not on file  Tobacco Use  . Smoking status: Current Every Day Smoker    Packs/day: 0.50    Types: Cigarettes  . Smokeless tobacco: Never Used  . Tobacco comment: smoked this am  Vaping Use  . Vaping Use: Never used  Substance and Sexual Activity  . Alcohol use: Not Currently  . Drug use: Yes    Types: Cocaine    Comment: does not use  . Sexual activity: Not on file  Other Topics Concern  . Not on file  Social History Narrative  . Not on file   Social Determinants of Health   Financial Resource  Strain: Not on file  Food Insecurity: Not on file  Transportation Needs: Not on file  Physical Activity: Not on file  Stress: Not on file  Social Connections: Not on file  Intimate Partner Violence: Not on file         Allergies  Allergen Reactions  . Bee Venom Anaphylaxis    Per pt, nearly died from bee sting as a child  . Propofol Other (See Comments)     Hallucinations  . Morphine Nausea And Vomiting  . Oxycodone Nausea And Vomiting    No current facility-administered medications for this encounter.         Current Outpatient Medications  Medication Sig Dispense Refill  . albuterol (VENTOLIN HFA) 108 (90 Base) MCG/ACT inhaler Inhale 1 puff into the lungs every 4 (four) hours as needed for wheezing or shortness of breath. 6.7 g 0  . aspirin EC 81 MG EC tablet Take 1 tablet (81 mg total) by mouth daily. 30 tablet 0  . cephALEXin (KEFLEX) 250 MG capsule Take 1 capsule (250 mg total) by mouth 2 (two) times daily. (Patient not taking: No sig reported) 14 capsule 0  . ergocalciferol (VITAMIN D2) 1.25 MG (50000 UT) capsule Take 1 capsule (50,000 Units total) by mouth every Friday. 30 capsule 1  . HYDROcodone-acetaminophen (NORCO) 5-325 MG tablet Take 1 tablet by mouth every 6 (six) hours as needed for moderate pain. 15 tablet 0  . insulin aspart (NOVOLOG) 100 UNIT/ML injection Inject 10 Units into the skin 3 (three) times daily with meals. (Patient taking differently: Inject 4 Units into the skin 3 (three) times daily with meals.) 10 mL 11  . insulin glargine (LANTUS) 100 UNIT/ML injection Inject 0.35 mLs (35 Units total) into the skin at bedtime. (Patient taking differently: Inject 25 Units into the skin at bedtime.) 10 mL 11  . Insulin Pen Needle (PEN NEEDLES 31GX5/16") 31G X 8 MM MISC 1 each by Other route daily. 90 each 2  . midodrine (PROAMATINE) 10 MG tablet Take 1 tablet (10 mg total) by mouth 3 (three) times daily with meals. 180 tablet 2  . pregabalin (LYRICA) 50 MG  capsule Take 1 capsule (50 mg total) by mouth 2 (two) times daily. 60 capsule 2    REVIEW OF SYSTEMS:  '[X]'$  denotes positive finding, '[ ]'$  denotes negative finding Cardiac  Comments:  Chest pain or chest pressure:    Shortness of breath upon exertion:    Short of breath when lying flat:    Irregular heart rhythm:        Vascular    Pain in calf, thigh, or hip brought on by ambulation:    Pain in feet at night that wakes you up from your sleep:  Blood clot in your veins:    Leg swelling:         Pulmonary    Oxygen at home:    Productive cough:     Wheezing:         Neurologic    Sudden weakness in arms or legs:     Sudden numbness in arms or legs:     Sudden onset of difficulty speaking or slurred speech:    Temporary loss of vision in one eye:     Problems with dizziness:         Gastrointestinal    Blood in stool:     Vomited blood:         Genitourinary    Burning when urinating:     Blood in urine:        Psychiatric    Major depression:         Hematologic    Bleeding problems:    Problems with blood clotting too easily:        Skin    Rashes or ulcers:        Constitutional    Fever or chills:     PHYSICAL EXAM:         Vitals:   06/03/20 1553 06/03/20 1554 06/03/20 1833 06/03/20 1845  BP: (!) 157/88  (!) 163/93 (!) 166/91  Pulse: 88  86 85  Resp: '18 18 18   '$ Temp:      TempSrc:      SpO2: 99%  97% 97%   Constitutional: chronically ill appearing in no distress. Appears well nourished.  Neurologic: CN intact. No focal findings. No sensory loss. Psychiatric: Mood and affect symmetric and appropriate. Eyes: No icterus. No conjunctival pallor. Ears, nose, throat: mucous membranes moist. Midline trachea.  Cardiac: regular rate and rhythm.  Respiratory: unlabored. Abdominal: soft, non-tender, non-distended.  Peripheral vascular:              2+ L radial pulse             Absent thrill in LUE axillary loop AVG Extremity: No edema. No cyanosis. No pallor.  Skin: No gangrene. No ulceration.  Lymphatic: No Stemmer's sign. No palpable lymphadenopathy.   DATA REVIEW:    Most recent CBC CBC Latest Ref Rng & Units 05/17/2020 04/25/2020 04/14/2020  WBC 4.0 - 10.5 K/uL 7.2 6.9 7.2  Hemoglobin 13.0 - 17.0 g/dL 11.5(L) 9.6(L) 9.3(L)  Hematocrit 39.0 - 52.0 % 38.1(L) 31.5(L) 31.4(L)  Platelets 150 - 400 K/uL 239 268 276     Most recent CMP CMP Latest Ref Rng & Units 05/17/2020 04/25/2020 04/14/2020  Glucose 70 - 99 mg/dL 203(H) 145(H) 182(H)  BUN 6 - 20 mg/dL 47(H) 39(H) 26(H)  Creatinine 0.61 - 1.24 mg/dL 3.87(H) 4.00(H) 3.73(H)  Sodium 135 - 145 mmol/L 140 138 137  Potassium 3.5 - 5.1 mmol/L 3.3(L) 4.3 3.8  Chloride 98 - 111 mmol/L 103 103 102  CO2 22 - 32 mmol/L '23 22 23  '$ Calcium 8.9 - 10.3 mg/dL 9.2 7.9(L) 8.6(L)  Total Protein 6.5 - 8.1 g/dL - - 7.4  Total Bilirubin 0.3 - 1.2 mg/dL - - 0.7  Alkaline Phos 38 - 126 U/L - - 100  AST 15 - 41 U/L - - 12(L)  ALT 0 - 44 U/L - - 10    Renal function Estimated Creatinine Clearance: 31.2 mL/min (A) (by C-G formula based on SCr of 3.87 mg/dL (H)).  Last Labs     Hgb A1c  MFr Bld (%)  Date Value  10/17/2019 8.8 (H)      Last Labs         LDL Cholesterol  Date Value Ref Range Status  08/16/2019 50 0 - 99 mg/dL Final    Comment:           Total Cholesterol/HDL:CHD Risk Coronary Heart Disease Risk Table                     Men   Women  1/2 Average Risk   3.4   3.3  Average Risk       5.0   4.4  2 X Average Risk   9.6   7.1  3 X Average Risk  23.4   11.0        Use the calculated Patient Ratio above and the CHD Risk Table to determine the patient's CHD Risk.        ATP III CLASSIFICATION (LDL):  <100     mg/dL   Optimal  100-129  mg/dL   Near or Above                    Optimal  130-159  mg/dL   Borderline  160-189  mg/dL   High  >190     mg/dL    Very High Performed at [facility] [address], [place], [place] [address]        Yevonne Aline. Stanford Breed, MD [facility] Phone Number: [phone] 06/03/2020 7:51 PM             Addendum:  The patient has been re-examined and re-evaluated.  The patient's history and physical has been reviewed and is unchanged.    Zachary Young is a 60 y.o. male is being admitted with ESRD. All the risks, benefits and other treatment options have been discussed with the patient. The patient has consented to proceed with Procedure(s): RIGHT ARM ARTERIOVENOUS (AV) FISTULA VERSUS ARTERIOVENOUS GRAFT CREATION as a surgical intervention.  Zachary Young 07/14/2020 10:41 AM Vascular and Vein Surgery

## 2020-07-14 NOTE — Anesthesia Postprocedure Evaluation (Signed)
Anesthesia Post Note  Patient: Zachary Young  Procedure(s) Performed: RIGHT ARM ARTERIOVENOUS (AV) GRAFT CREATION (Right Arm Lower)  Patient location during evaluation: PACU Anesthesia Type: General Level of consciousness: awake and alert and oriented Pain management: pain level controlled Vital Signs Assessment: post-procedure vital signs reviewed and stable Respiratory status: spontaneous breathing and respiratory function stable Cardiovascular status: blood pressure returned to baseline and stable Postop Assessment: no apparent nausea or vomiting Anesthetic complications: no   No complications documented.   Last Vitals:  Vitals:   07/14/20 0833 07/14/20 1334  BP: 134/87 114/63  Pulse: 91 86  Resp: 20 15  Temp: 37 C 36.6 C  SpO2: 98% 99%    Last Pain:  Vitals:   07/14/20 1345  TempSrc:   PainSc: 0-No pain                 Callie Facey C Tianne Plott

## 2020-07-14 NOTE — Anesthesia Preprocedure Evaluation (Signed)
Anesthesia Evaluation  Patient identified by MRN, date of birth, ID band Patient awake    Reviewed: Allergy & Precautions, NPO status , Patient's Chart, lab work & pertinent test results  History of Anesthesia Complications Negative for: history of anesthetic complications  Airway Mallampati: III  TM Distance: >3 FB Neck ROM: Full    Dental  (+) Dental Advisory Given, Edentulous Upper, Poor Dentition, Missing   Pulmonary shortness of breath and with exertion, Current Smoker and Patient abstained from smoking.,    Pulmonary exam normal breath sounds clear to auscultation       Cardiovascular Exercise Tolerance: Poor hypertension, Pt. on medications + Past MI, + Peripheral Vascular Disease and +CHF  Normal cardiovascular exam+ dysrhythmias (prolonged QT)  Rhythm:Regular Rate:Normal  1. Extremely limited; definity used; low normal LV systolic function; mild LVH; mld LVE.  2. Left ventricular ejection fraction, by estimation, is 50 to 55%. The  left ventricle has low normal function. The left ventricle has no regional  wall motion abnormalities. The left ventricular internal cavity size was mildly dilated. There is mild left ventricular hypertrophy. Left ventricular diastolic parameters are indeterminate.  3. Right ventricular systolic function was not well visualized. The right ventricular size is not well visualized.  4. The mitral valve is normal in structure. No evidence of mitral valve regurgitation. No evidence of mitral stenosis.  5. The aortic valve is tricuspid. Aortic valve regurgitation is not visualized. Mild aortic valve sclerosis is present, with no evidence of aortic valve stenosis.  12-Jun-2020 19:48:19 [facility]-ED ROUTINE RECORD Sinus rhythm Left ventricular hypertrophy Anterior infarct, old Prolonged QT interval No significant change since prior 2/22 Confirmed by Aletta Edouard [phone]) on  06/12/2020 7:56:38 PM Also confirmed by Aletta Edouard [phone]), editor [address] [place] [phone]) on 06/13/2020 9:43:39 AM    Neuro/Psych  Neuromuscular disease negative psych ROS   GI/Hepatic GERD  Medicated and Poorly Controlled,(+)     substance abuse  cocaine use, Pancreatitis  Hematemesis     Endo/Other  diabetes, Well Controlled, Type 2, Insulin Dependent  Renal/GU ESRF and DialysisRenal disease  negative genitourinary   Musculoskeletal negative musculoskeletal ROS (+)   Abdominal   Peds  Hematology  (+) anemia ,   Anesthesia Other Findings   Reproductive/Obstetrics negative OB ROS                           Anesthesia Physical  Anesthesia Plan  ASA: IV  Anesthesia Plan: General   Post-op Pain Management:    Induction: Intravenous  PONV Risk Score and Plan: Propofol infusion  Airway Management Planned: Nasal Cannula, Natural Airway and Simple Face Mask  Additional Equipment:   Intra-op Plan:   Post-operative Plan:   Informed Consent: I have reviewed the patients History and Physical, chart, labs and discussed the procedure including the risks, benefits and alternatives for the proposed anesthesia with the patient or authorized representative who has indicated his/her understanding and acceptance.     Dental advisory given  Plan Discussed with: CRNA and Surgeon  Anesthesia Plan Comments: (Patient received propofol multiple times before, denied any reactions to anesthesia medications. Will use propofol during the procedure. )       Anesthesia Quick Evaluation

## 2020-07-14 NOTE — Transfer of Care (Signed)
Immediate Anesthesia Transfer of Care Note  Patient: Zachary Young  Procedure(s) Performed: RIGHT ARM ARTERIOVENOUS (AV) GRAFT CREATION (Right Arm Lower)  Patient Location: PACU  Anesthesia Type:MAC  Level of Consciousness: awake and alert   Airway & Oxygen Therapy: Patient Spontanous Breathing and Patient connected to nasal cannula oxygen  Post-op Assessment: Report given to RN and Post -op Vital signs reviewed and stable  Post vital signs: Reviewed and stable  Last Vitals:  Vitals Value Taken Time  BP 114/63 07/14/20 1334  Temp    Pulse 85 07/14/20 1335  Resp 18 07/14/20 1335  SpO2 99 % 07/14/20 1335  Vitals shown include unvalidated device data.  Last Pain:  Vitals:   07/14/20 0833  TempSrc: Oral  PainSc: 2       Patients Stated Pain Goal: 5 (59/93/57 0177)  Complications: No complications documented.

## 2020-07-15 ENCOUNTER — Encounter (HOSPITAL_COMMUNITY): Payer: Self-pay | Admitting: Vascular Surgery

## 2020-07-18 ENCOUNTER — Ambulatory Visit: Payer: Medicaid Other | Admitting: Vascular Surgery

## 2020-07-21 ENCOUNTER — Telehealth: Payer: Self-pay

## 2020-07-21 NOTE — Telephone Encounter (Signed)
Ramiro Harvest PA called - patient is complaining of tightness in arm s/p graft placement on 3/31. It is generally red, but incisions look okay, no drainage, fever or chills. Says patient is not elevating how he should. Moved up patient appt per request and advised elevation.

## 2020-07-22 ENCOUNTER — Other Ambulatory Visit: Payer: Self-pay

## 2020-07-22 ENCOUNTER — Emergency Department (HOSPITAL_COMMUNITY)
Admission: EM | Admit: 2020-07-22 | Discharge: 2020-07-22 | Disposition: A | Discharge status: Home / Self Care | Payer: Medicaid Other | Attending: Emergency Medicine | Admitting: Emergency Medicine

## 2020-07-22 ENCOUNTER — Encounter (HOSPITAL_COMMUNITY): Payer: Self-pay | Admitting: *Deleted

## 2020-07-22 ENCOUNTER — Emergency Department (HOSPITAL_COMMUNITY)
Admission: EM | Admit: 2020-07-22 | Discharge: 2020-07-22 | Discharge status: Absent without leave | Payer: Medicaid Other

## 2020-07-22 ENCOUNTER — Emergency Department (HOSPITAL_COMMUNITY)
Admission: EM | Admit: 2020-07-22 | Discharge: 2020-07-23 | Disposition: A | Discharge status: Home / Self Care | Payer: Medicaid Other | Source: Home / Self Care | Attending: Emergency Medicine | Admitting: Emergency Medicine

## 2020-07-22 ENCOUNTER — Emergency Department (HOSPITAL_COMMUNITY): Payer: Medicaid Other

## 2020-07-22 DIAGNOSIS — I132 Hypertensive heart and chronic kidney disease with heart failure and with stage 5 chronic kidney disease, or end stage renal disease: Secondary | ICD-10-CM | POA: Insufficient documentation

## 2020-07-22 DIAGNOSIS — E1140 Type 2 diabetes mellitus with diabetic neuropathy, unspecified: Secondary | ICD-10-CM | POA: Insufficient documentation

## 2020-07-22 DIAGNOSIS — E1165 Type 2 diabetes mellitus with hyperglycemia: Secondary | ICD-10-CM | POA: Insufficient documentation

## 2020-07-22 DIAGNOSIS — I5042 Chronic combined systolic (congestive) and diastolic (congestive) heart failure: Secondary | ICD-10-CM | POA: Diagnosis not present

## 2020-07-22 DIAGNOSIS — Z992 Dependence on renal dialysis: Secondary | ICD-10-CM | POA: Insufficient documentation

## 2020-07-22 DIAGNOSIS — R1013 Epigastric pain: Secondary | ICD-10-CM

## 2020-07-22 DIAGNOSIS — Z79899 Other long term (current) drug therapy: Secondary | ICD-10-CM | POA: Diagnosis not present

## 2020-07-22 DIAGNOSIS — R1110 Vomiting, unspecified: Secondary | ICD-10-CM | POA: Diagnosis not present

## 2020-07-22 DIAGNOSIS — E1159 Type 2 diabetes mellitus with other circulatory complications: Secondary | ICD-10-CM | POA: Insufficient documentation

## 2020-07-22 DIAGNOSIS — R1084 Generalized abdominal pain: Secondary | ICD-10-CM | POA: Insufficient documentation

## 2020-07-22 DIAGNOSIS — Z794 Long term (current) use of insulin: Secondary | ICD-10-CM | POA: Insufficient documentation

## 2020-07-22 DIAGNOSIS — R110 Nausea: Secondary | ICD-10-CM | POA: Insufficient documentation

## 2020-07-22 DIAGNOSIS — I5040 Unspecified combined systolic (congestive) and diastolic (congestive) heart failure: Secondary | ICD-10-CM | POA: Insufficient documentation

## 2020-07-22 DIAGNOSIS — F17210 Nicotine dependence, cigarettes, uncomplicated: Secondary | ICD-10-CM | POA: Insufficient documentation

## 2020-07-22 DIAGNOSIS — Z7982 Long term (current) use of aspirin: Secondary | ICD-10-CM | POA: Diagnosis not present

## 2020-07-22 DIAGNOSIS — N186 End stage renal disease: Secondary | ICD-10-CM | POA: Insufficient documentation

## 2020-07-22 DIAGNOSIS — E113399 Type 2 diabetes mellitus with moderate nonproliferative diabetic retinopathy without macular edema, unspecified eye: Secondary | ICD-10-CM | POA: Insufficient documentation

## 2020-07-22 LAB — COMPREHENSIVE METABOLIC PANEL
ALT: 7 U/L (ref 0–44)
AST: 11 U/L — ABNORMAL LOW (ref 15–41)
Albumin: 3.4 g/dL — ABNORMAL LOW (ref 3.5–5.0)
Alkaline Phosphatase: 127 U/L — ABNORMAL HIGH (ref 38–126)
Anion gap: 13 (ref 5–15)
BUN: 36 mg/dL — ABNORMAL HIGH (ref 6–20)
CO2: 20 mmol/L — ABNORMAL LOW (ref 22–32)
Calcium: 8.1 mg/dL — ABNORMAL LOW (ref 8.9–10.3)
Chloride: 102 mmol/L (ref 98–111)
Creatinine, Ser: 4.35 mg/dL — ABNORMAL HIGH (ref 0.61–1.24)
GFR, Estimated: 15 mL/min — ABNORMAL LOW (ref >60)
Glucose, Bld: 87 mg/dL (ref 70–99)
Potassium: 4.1 mmol/L (ref 3.5–5.1)
Sodium: 135 mmol/L (ref 135–145)
Total Bilirubin: 0.4 mg/dL (ref 0.3–1.2)
Total Protein: 8 g/dL (ref 6.5–8.1)

## 2020-07-22 LAB — CBC WITH DIFFERENTIAL/PLATELET
Abs Immature Granulocytes: 0.02 10*3/uL (ref 0.00–0.07)
Basophils Absolute: 0 10*3/uL (ref 0.0–0.1)
Basophils Relative: 1 %
Eosinophils Absolute: 0.1 10*3/uL (ref 0.0–0.5)
Eosinophils Relative: 2 %
HCT: 42.6 % (ref 39.0–52.0)
Hemoglobin: 13 g/dL (ref 13.0–17.0)
Immature Granulocytes: 0 %
Lymphocytes Relative: 25 %
Lymphs Abs: 1.8 10*3/uL (ref 0.7–4.0)
MCH: 27.6 pg (ref 26.0–34.0)
MCHC: 30.5 g/dL (ref 30.0–36.0)
MCV: 90.4 fL (ref 80.0–100.0)
Monocytes Absolute: 0.5 10*3/uL (ref 0.1–1.0)
Monocytes Relative: 6 %
Neutro Abs: 4.8 10*3/uL (ref 1.7–7.7)
Neutrophils Relative %: 66 %
Platelets: 245 10*3/uL (ref 150–400)
RBC: 4.71 MIL/uL (ref 4.22–5.81)
RDW: 18.8 % — ABNORMAL HIGH (ref 11.5–15.5)
WBC: 7.3 10*3/uL (ref 4.0–10.5)
nRBC: 0 % (ref 0.0–0.2)

## 2020-07-22 LAB — PROTIME-INR
INR: 1 (ref 0.8–1.2)
Prothrombin Time: 13 seconds (ref 11.4–15.2)

## 2020-07-22 LAB — CBG MONITORING, ED: Glucose-Capillary: 64 mg/dL — ABNORMAL LOW (ref 70–99)

## 2020-07-22 LAB — LIPASE, BLOOD: Lipase: 40 U/L (ref 11–51)

## 2020-07-22 MED ORDER — LIDOCAINE VISCOUS HCL 2 % MT SOLN: 15.0000 mL | Freq: Once | OROMUCOSAL | Status: DC

## 2020-07-22 MED ORDER — HYDROMORPHONE HCL 2 MG/ML IJ SOLN
2.0000 mg | Freq: Once | INTRAMUSCULAR | Status: AC
  Administered 2020-07-22: 2 mg via INTRAMUSCULAR
  Filled 2020-07-22: qty 1

## 2020-07-22 MED ORDER — DICYCLOMINE HCL 20 MG PO TABS: 20.0000 mg | ORAL_TABLET | Freq: Two times a day (BID) | ORAL | 0 refills | Status: AC

## 2020-07-22 MED ORDER — ONDANSETRON 8 MG PO TBDP
8.0000 mg | ORAL_TABLET | Freq: Once | ORAL | Status: AC
  Administered 2020-07-22: 8 mg via ORAL
  Filled 2020-07-22: qty 1

## 2020-07-22 MED ORDER — SUCRALFATE 1 G PO TABS: 1.0000 g | ORAL_TABLET | Freq: Three times a day (TID) | ORAL | 0 refills | Status: AC

## 2020-07-22 MED ORDER — ALUM & MAG HYDROXIDE-SIMETH 200-200-20 MG/5ML PO SUSP: 30.0000 mL | Freq: Once | ORAL | Status: DC

## 2020-07-22 NOTE — ED Notes (Signed)
Pt unable to sign waiver at this time.
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
## 2020-07-22 NOTE — ED Triage Notes (Signed)
Pt states he has been vomiting x 3 days and states his stools have been dark

## 2020-07-22 NOTE — ED Notes (Signed)
Pt given gingerale to drink and graham crackers and peanut butter.

## 2020-07-22 NOTE — ED Notes (Signed)
Pt came out of the room stating that he is leaving and refused to sign out AMA. Pt seem leaving.

## 2020-07-22 NOTE — ED Triage Notes (Signed)
Pt c/o abdominal pain and vomiting. Pt left AMA this afternoon

## 2020-07-22 NOTE — ED Notes (Signed)
Pt rolled out of ER in hover round, states " im leaving, I dont care." pt refused to sign AMA form

## 2020-07-22 NOTE — Discharge Instructions (Addendum)
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
## 2020-07-25 ENCOUNTER — Ambulatory Visit: Payer: Medicaid Other | Admitting: Neurology

## 2020-07-25 ENCOUNTER — Other Ambulatory Visit: Payer: Self-pay

## 2020-07-25 DIAGNOSIS — R569 Unspecified convulsions: Secondary | ICD-10-CM | POA: Diagnosis not present

## 2020-07-25 DIAGNOSIS — R55 Syncope and collapse: Secondary | ICD-10-CM

## 2020-07-29 ENCOUNTER — Other Ambulatory Visit: Payer: Self-pay | Admitting: Neurology

## 2020-08-01 ENCOUNTER — Encounter: Payer: Medicaid Other | Admitting: Vascular Surgery

## 2020-08-02 ENCOUNTER — Ambulatory Visit (INDEPENDENT_AMBULATORY_CARE_PROVIDER_SITE_OTHER): Payer: Medicaid Other | Admitting: Gastroenterology

## 2020-08-02 ENCOUNTER — Other Ambulatory Visit: Payer: Self-pay | Admitting: *Deleted

## 2020-08-02 ENCOUNTER — Telehealth: Payer: Self-pay | Admitting: *Deleted

## 2020-08-02 ENCOUNTER — Emergency Department (HOSPITAL_COMMUNITY)
Admission: EM | Admit: 2020-08-02 | Discharge: 2020-08-02 | Disposition: A | Discharge status: Home / Self Care | Payer: Medicaid Other

## 2020-08-02 ENCOUNTER — Encounter: Payer: Self-pay | Admitting: Gastroenterology

## 2020-08-02 ENCOUNTER — Other Ambulatory Visit: Payer: Self-pay

## 2020-08-02 ENCOUNTER — Encounter: Payer: Self-pay | Admitting: *Deleted

## 2020-08-02 VITALS — BP 127/75 | HR 90 | Temp 97.3°F | Wt 199.0 lb

## 2020-08-02 DIAGNOSIS — R112 Nausea with vomiting, unspecified: Secondary | ICD-10-CM

## 2020-08-02 DIAGNOSIS — K8500 Idiopathic acute pancreatitis without necrosis or infection: Secondary | ICD-10-CM

## 2020-08-02 MED ORDER — PANTOPRAZOLE SODIUM 40 MG PO TBEC: 40.0000 mg | DELAYED_RELEASE_TABLET | Freq: Two times a day (BID) | ORAL | 3 refills | Status: AC

## 2020-08-02 NOTE — ED Notes (Addendum)
This nurse went to get pt to take back to room to triage. When asking pt whats he being seen for today, he states he wants his left arm fistula catheter taken out. He states they do not use it at dialysis anymore because he has 2 other fistulas and he wants it taken out. This nurse consulted with EDP and told pt we do not take those out in the ED. Pt states if yall cannot take this out of my arm im leaving, that's all I want to be seen for. This nurse stated to pt are you sure, the EDP can assess you and pt states no that is all I wanted to come here for. Pt seen leaving ED in motor wheelchair.

## 2020-08-02 NOTE — ED Provider Notes (Signed)
Patient checked in to have his left arm dialysis graft removed.  As he was being brought back to the ER room, he found out that we do not typically do this from the triage nurse.  Thus he states he does not want a be seen.  I offered to examine him but discussed that typically these are not removed.  However after this, he states he does not want to be seen anymore and left.  I did not get to properly examine him or get vital signs.   Sherwood Gambler, MD 08/02/20 [phone]

## 2020-08-02 NOTE — Telephone Encounter (Signed)
Checked [place] complete health website and patient's insurance is only valid until Aug 13, 2020

## 2020-08-02 NOTE — Progress Notes (Signed)
Referring Provider: No ref. provider found Primary Care Physician:  Mellissa Kohut, DO Primary GI: Dr. Abbey Chatters  Chief Complaint  Patient presents with  . Emesis    HPI:   Zachary Young is a 60 y.o. male presenting today with a history of ESRD on dialysis, diabetes, CHF, HTN, diabetic neuropathy, cocaine abuse, recently inpatient March 2022 with acute uncomplicated idiopathic pancreatitis. He also had hematemesis, undergoing EGD while inpatient with gastritis, normal duodenum. Non-compliant, leaving AMA at first visit.     He left AMA in Feb 2022 during first presentation for pancreatitis, presenting several days later with same symptoms.    History of drinking on weekends but none in 6-8 months. Quit cocaine about 2 months ago. No abdominal pain. Occasional nausea and vomiting. Nausea is intermittent. Vomiting last week. Difficult historian. Ate eggs, grits, and toast, even though he says he is allergic to eggs.  If eating larger amounts of food, will come back up. Protonix daily. Feels full off of small amounts of food. No bloating. No rectal bleeding. Declining colonoscopy. A1c 9.2 one month ago.    Past Medical History:  Diagnosis Date  . CHF (congestive heart failure) ([place])   . Diabetes mellitus without complication ([place])   . Dialysis patient ([place])   . Peripheral edema   . Renal disorder    kidney disease    Past Surgical History:  Procedure Laterality Date  . AMPUTATION Right 10/21/2019   Procedure: RIGHT BELOW KNEE AMPUTATION;  Surgeon: Newt Minion, MD;  Location: [facility];  Service: Orthopedics;  Laterality: Right;  . AV FISTULA PLACEMENT Left 11/13/2019   Procedure: LEFT ARTERIOVENOUS (AV) FISTULA VERSUS ARTERIOVENOUS GRAFT;  Surgeon: Rosetta Posner, MD;  Location[facility] OR;  Service: Vascular;  Laterality: Left;  . AV FISTULA PLACEMENT Left 04/05/2020   Procedure: INSERTION OF LEFT ARM ARTERIOVENOUS (AV) GORE-TEX GRAFT;  Surgeon: Rosetta Posner, MD;  [facility];   Service: Vascular;  Laterality: Left;  . AV FISTULA PLACEMENT Right 07/14/2020   Procedure: RIGHT ARM ARTERIOVENOUS (AV) GRAFT CREATION;  Surgeon: Rosetta Posner, MD;  Location: [facility];  Service: Vascular;  Laterality: Right;  . BASCILIC VEIN TRANSPOSITION Left 01/27/2020   Procedure: LEFT ARM 2ND STAGE BASCILIC VEIN TRANSPOSITION;  Surgeon: Rosetta Posner, MD;  Location: [facility];  Service: Vascular;  Laterality: Left;  . DIALYSIS/PERMA CATHETER INSERTION N/A 03/03/2020   Procedure: DIALYSIS/PERMA CATHETER INSERTION;  Surgeon: Algernon Huxley, MD;  Location: [facility] CV LAB;  Service: Cardiovascular;  Laterality: N/A;  . ESOPHAGOGASTRODUODENOSCOPY (EGD) WITH PROPOFOL N/A 06/17/2020   gastritis, normal duodenum.   . INSERTION OF DIALYSIS CATHETER N/A 04/05/2020   Procedure: INSERTION OF TUNNEL  DIALYSIS CATHETER LEFT INTERNAL JUGULAR;  Surgeon: Rosetta Posner, MD;  Location: [facility];  Service: Vascular;  Laterality: N/A;  . IR FLUORO GUIDE CV LINE RIGHT  11/05/2019  . IR FLUORO GUIDE CV LINE RIGHT  06/07/2020  . IR REMOVAL TUN CV CATH W/O FL  06/01/2020  . IR THROMBECTOMY AV FISTULA W/THROMBOLYSIS/PTA INC/SHUNT/IMG LEFT Left 06/06/2020  . IR US GUIDE VASC ACCESS LEFT  06/06/2020  . IR US GUIDE VASC ACCESS RIGHT  11/05/2019  . IR US GUIDE VASC ACCESS RIGHT  06/07/2020  . REMOVAL OF A DIALYSIS CATHETER Right 04/05/2020   Procedure: REMOVAL OF RIGHT CHEST DIALYSIS CATHETER;  Surgeon: Rosetta Posner, MD;  Location: [facility];  Service: Vascular;  Laterality: Right;  . TOE AMPUTATION Bilateral    due  to osteomyelitis, all 5 each foot    Current Outpatient Medications  Medication Sig Dispense Refill  . amLODipine (NORVASC) 5 MG tablet Take 1 tablet (5 mg total) by mouth daily. 30 tablet 1  . aspirin EC 81 MG EC tablet Take 1 tablet (81 mg total) by mouth daily. (Patient taking differently: Take 81 mg by mouth 2 (two) times daily.) 30 tablet 0  . atorvastatin (LIPITOR) 20 MG tablet Take 1 tablet (20 mg total) by  mouth daily with supper. 30 tablet 1  . dicyclomine (BENTYL) 20 MG tablet Take 1 tablet (20 mg total) by mouth 2 (two) times daily. 20 tablet 0  . ergocalciferol (VITAMIN D2) 1.25 MG (50000 UT) capsule Take 1 capsule (50,000 Units total) by mouth every Friday. 30 capsule 1  . insulin glargine (LANTUS) 100 UNIT/ML injection Inject 0.35 mLs (35 Units total) into the skin at bedtime. (Patient taking differently: Inject 25 Units into the skin at bedtime.) 10 mL 11  . Insulin Pen Needle (PEN NEEDLES 31GX5/16") 31G X 8 MM MISC 1 each by Other route daily. 90 each 2  . NOVOLOG FLEXPEN 100 UNIT/ML FlexPen Inject 4 Units into the skin 3 (three) times daily with meals.    . pantoprazole (PROTONIX) 40 MG tablet Take 1 tablet (40 mg total) by mouth 2 (two) times daily before a meal. 60 tablet 3  . phenytoin (DILANTIN) 100 MG ER capsule Take 3 capsules (300 mg total) by mouth at bedtime. 30 capsule 3  . sucralfate (CARAFATE) 1 g tablet Take 1 tablet (1 g total) by mouth 4 (four) times daily -  with meals and at bedtime. 90 tablet 0  . torsemide (DEMADEX) 100 MG tablet Take 1 tablet (100 mg total) by mouth daily. On Monday-Wednesday-Friday-Sunday (non-dialysis days) (Patient taking differently: Take 100 mg by mouth 4 (four) times a week. On Monday-Wednesday-Friday-Sunday (non-dialysis days)) 30 tablet 1   No current facility-administered medications for this visit.    Allergies as of 08/02/2020 - Review Complete 08/02/2020  Allergen Reaction Noted  . Bee venom Anaphylaxis 06/12/2016  . Propofol Other (See Comments) 04/28/2018  . Eggs or egg-derived products Nausea And Vomiting and Other (See Comments) 04/08/2016  . Morphine Nausea And Vomiting 03/05/2018  . Oxycodone Nausea And Vomiting 06/18/2019    Family History  Problem Relation Age of Onset  . Cancer Mother        uknown type of cancer  . Colon cancer Neg Hx   . Colon polyps Neg Hx     Social History   Socioeconomic History  . Marital  status: Single    Spouse name: Not on file  . Number of children: Not on file  . Years of education: Not on file  . Highest education level: Not on file  Occupational History  . Not on file  Tobacco Use  . Smoking status: Current Every Day Smoker    Packs/day: 0.50    Types: Cigarettes  . Smokeless tobacco: Never Used  Vaping Use  . Vaping Use: Never used  Substance and Sexual Activity  . Alcohol use: Not Currently  . Drug use: Not Currently    Types: Cocaine    Comment: last cocaine use a few months ago; denied 08/02/20  . Sexual activity: Not Currently  Other Topics Concern  . Not on file  Social History Narrative   Lives with friend   Right Handed   Drinks no caffeine   Social Determinants of Radio broadcast assistant  Strain: Not on file  Food Insecurity: Not on file  Transportation Needs: Not on file  Physical Activity: Not on file  Stress: Not on file  Social Connections: Not on file    Review of Systems: Gen: Denies fever, chills, anorexia. Denies fatigue, weakness, weight loss.  CV: Denies chest pain, palpitations, syncope, peripheral edema, and claudication. Resp: Denies dyspnea at rest, cough, wheezing, coughing up blood, and pleurisy. GI: see HPI Derm: Denies rash, itching, dry skin Psych: Denies depression, anxiety, memory loss, confusion. No homicidal or suicidal ideation.  Heme: Denies bruising, bleeding, and enlarged lymph nodes.  Physical Exam: BP 127/75   Pulse 90   Temp (!) 97.3 F (36.3 C) (Temporal)   Wt 199 lb (90.3 kg) Comment: stated by pt  BMI 26.99 kg/m  General:   Alert and oriented. No distress noted. Irritable at visit and answering with short replies.  Head:  Normocephalic and atraumatic. Eyes:  Conjuctiva clear without scleral icterus. Mouth:  Mask in place Abdomen:  +BS, soft, obese, non-tender and non-distended. Limited exam with patient in wheelchair Extremities:  Right BKA, left foot with all 5 toes removed Neurologic:   Alert and  oriented x4 Psych:  Flat affect   ASSESSMENT: Zachary Young is a 60 y.o. male presenting today with a history of ESRD on dialysis, diabetes, CHF, HTN, diabetic neuropathy, cocaine abuse, recently inpatient March 2022 with acute uncomplicated idiopathic pancreatitis. He also had hematemesis, undergoing EGD while inpatient with gastritis, normal duodenum.   Pancreatitis: resolved. Unknown etiology. Denies any recent cocaine or ETOH use. Triglycerides only mildly elevated. Gallbladder absent and doubt microlithiasis. Less likely med effect. Will pursue EUS to exclude occult etiology.   Nausea and vomiting: in setting of poorly controlled diabetes. EGD on file. Will pursue GES. Increase PPI to BID for now.   Need for screening colonoscopy: he is declining this currently.     PLAN:  GES PPI BID EUS referral placed Return in 2-3 months   Annitta Needs, PhD, [facility]

## 2020-08-02 NOTE — Patient Instructions (Signed)
We are arranging a special test that measures how well your stomach empties called a gastric emptying study.  We are also referring you for an endoscopic ultrasound to check out your pancreas and make sure all is well.   I have increased pantoprazole (Protonix) to twice a day, 30 minutes before breakfast and dinner.  We will see you in 2-3 months!  I enjoyed seeing you again today! As you know, I value our relationship and want to provide genuine, compassionate, and quality care. I welcome your feedback. If you receive a survey regarding your visit,  I greatly appreciate you taking time to fill this out. See you next time!  Annitta Needs, PhD, ANP-BC [facility]

## 2020-08-02 NOTE — Telephone Encounter (Signed)
Patient left prior to GES being scheduled. It is on for 5/3 tues, arrival 7:45am, npo midnight, no stomach medications that morning. Test can take up to 4 hours.   Called pt, left detailed message on VM and letter mailed

## 2020-08-04 ENCOUNTER — Other Ambulatory Visit: Payer: Self-pay

## 2020-08-04 ENCOUNTER — Emergency Department (HOSPITAL_COMMUNITY)
Admission: EM | Admit: 2020-08-04 | Discharge: 2020-08-04 | Discharge status: Against Medical Advice | Payer: Medicaid Other

## 2020-08-06 ENCOUNTER — Encounter (HOSPITAL_COMMUNITY): Payer: Self-pay | Admitting: Emergency Medicine

## 2020-08-06 ENCOUNTER — Emergency Department (HOSPITAL_COMMUNITY)
Admission: EM | Admit: 2020-08-06 | Discharge: 2020-08-06 | Disposition: A | Discharge status: Home / Self Care | Payer: Medicaid Other | Attending: Emergency Medicine | Admitting: Emergency Medicine

## 2020-08-06 ENCOUNTER — Other Ambulatory Visit: Payer: Self-pay

## 2020-08-06 ENCOUNTER — Emergency Department (HOSPITAL_COMMUNITY): Payer: Medicaid Other

## 2020-08-06 DIAGNOSIS — Z7982 Long term (current) use of aspirin: Secondary | ICD-10-CM | POA: Insufficient documentation

## 2020-08-06 DIAGNOSIS — I132 Hypertensive heart and chronic kidney disease with heart failure and with stage 5 chronic kidney disease, or end stage renal disease: Secondary | ICD-10-CM | POA: Insufficient documentation

## 2020-08-06 DIAGNOSIS — E1122 Type 2 diabetes mellitus with diabetic chronic kidney disease: Secondary | ICD-10-CM | POA: Diagnosis not present

## 2020-08-06 DIAGNOSIS — Z794 Long term (current) use of insulin: Secondary | ICD-10-CM | POA: Insufficient documentation

## 2020-08-06 DIAGNOSIS — Z79899 Other long term (current) drug therapy: Secondary | ICD-10-CM | POA: Insufficient documentation

## 2020-08-06 DIAGNOSIS — T82898A Other specified complication of vascular prosthetic devices, implants and grafts, initial encounter: Secondary | ICD-10-CM

## 2020-08-06 DIAGNOSIS — X58XXXA Exposure to other specified factors, initial encounter: Secondary | ICD-10-CM | POA: Diagnosis not present

## 2020-08-06 DIAGNOSIS — Z992 Dependence on renal dialysis: Secondary | ICD-10-CM | POA: Insufficient documentation

## 2020-08-06 DIAGNOSIS — N186 End stage renal disease: Secondary | ICD-10-CM | POA: Insufficient documentation

## 2020-08-06 DIAGNOSIS — F17210 Nicotine dependence, cigarettes, uncomplicated: Secondary | ICD-10-CM | POA: Insufficient documentation

## 2020-08-06 DIAGNOSIS — T82590A Other mechanical complication of surgically created arteriovenous fistula, initial encounter: Secondary | ICD-10-CM | POA: Diagnosis not present

## 2020-08-06 DIAGNOSIS — I5042 Chronic combined systolic (congestive) and diastolic (congestive) heart failure: Secondary | ICD-10-CM | POA: Diagnosis not present

## 2020-08-06 DIAGNOSIS — E1140 Type 2 diabetes mellitus with diabetic neuropathy, unspecified: Secondary | ICD-10-CM | POA: Insufficient documentation

## 2020-08-06 LAB — CBC WITH DIFFERENTIAL/PLATELET
Abs Immature Granulocytes: 0.03 10*3/uL (ref 0.00–0.07)
Basophils Absolute: 0 10*3/uL (ref 0.0–0.1)
Basophils Relative: 0 %
Eosinophils Absolute: 0.2 10*3/uL (ref 0.0–0.5)
Eosinophils Relative: 2 %
HCT: 30.8 % — ABNORMAL LOW (ref 39.0–52.0)
Hemoglobin: 9.8 g/dL — ABNORMAL LOW (ref 13.0–17.0)
Immature Granulocytes: 0 %
Lymphocytes Relative: 23 %
Lymphs Abs: 1.8 10*3/uL (ref 0.7–4.0)
MCH: 28.9 pg (ref 26.0–34.0)
MCHC: 31.8 g/dL (ref 30.0–36.0)
MCV: 90.9 fL (ref 80.0–100.0)
Monocytes Absolute: 0.7 10*3/uL (ref 0.1–1.0)
Monocytes Relative: 9 %
Neutro Abs: 5.4 10*3/uL (ref 1.7–7.7)
Neutrophils Relative %: 66 %
Platelets: 225 10*3/uL (ref 150–400)
RBC: 3.39 MIL/uL — ABNORMAL LOW (ref 4.22–5.81)
RDW: 19.1 % — ABNORMAL HIGH (ref 11.5–15.5)
WBC: 8.1 10*3/uL (ref 4.0–10.5)
nRBC: 0 % (ref 0.0–0.2)

## 2020-08-06 LAB — LIPASE, BLOOD: Lipase: 77 U/L — ABNORMAL HIGH (ref 11–51)

## 2020-08-06 LAB — COMPREHENSIVE METABOLIC PANEL
ALT: 10 U/L (ref 0–44)
AST: 13 U/L — ABNORMAL LOW (ref 15–41)
Albumin: 3 g/dL — ABNORMAL LOW (ref 3.5–5.0)
Alkaline Phosphatase: 139 U/L — ABNORMAL HIGH (ref 38–126)
Anion gap: 9 (ref 5–15)
BUN: 48 mg/dL — ABNORMAL HIGH (ref 6–20)
CO2: 21 mmol/L — ABNORMAL LOW (ref 22–32)
Calcium: 7.5 mg/dL — ABNORMAL LOW (ref 8.9–10.3)
Chloride: 106 mmol/L (ref 98–111)
Creatinine, Ser: 3.94 mg/dL — ABNORMAL HIGH (ref 0.61–1.24)
GFR, Estimated: 17 mL/min — ABNORMAL LOW (ref >60)
Glucose, Bld: 126 mg/dL — ABNORMAL HIGH (ref 70–99)
Potassium: 4.5 mmol/L (ref 3.5–5.1)
Sodium: 136 mmol/L (ref 135–145)
Total Bilirubin: 0.3 mg/dL (ref 0.3–1.2)
Total Protein: 7.3 g/dL (ref 6.5–8.1)

## 2020-08-06 LAB — LACTIC ACID, PLASMA
Lactic Acid, Venous: 0.7 mmol/L (ref 0.5–1.9)
Lactic Acid, Venous: 0.5 mmol/L (ref 0.5–1.9)

## 2020-08-06 MED ORDER — OXYCODONE-ACETAMINOPHEN 5-325 MG PO TABS
1.0000 | ORAL_TABLET | Freq: Once | ORAL | Status: AC
  Administered 2020-08-06: 1 via ORAL
  Filled 2020-08-06: qty 1

## 2020-08-06 NOTE — ED Triage Notes (Signed)
Pt has c/o of right arm swelling, had vascular surgery approx 1 month ago, and arm has increased in swelling since then.

## 2020-08-06 NOTE — ED Notes (Signed)
ED Provider at bedside. 
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
## 2020-08-08 ENCOUNTER — Emergency Department (HOSPITAL_COMMUNITY)
Admission: EM | Admit: 2020-08-08 | Discharge: 2020-08-08 | Discharge status: Against Medical Advice | Payer: Medicaid Other | Attending: Emergency Medicine | Admitting: Emergency Medicine

## 2020-08-08 ENCOUNTER — Other Ambulatory Visit: Payer: Self-pay

## 2020-08-08 ENCOUNTER — Emergency Department (HOSPITAL_COMMUNITY): Payer: Medicaid Other

## 2020-08-08 ENCOUNTER — Encounter (HOSPITAL_COMMUNITY): Payer: Self-pay | Admitting: *Deleted

## 2020-08-08 DIAGNOSIS — Z79899 Other long term (current) drug therapy: Secondary | ICD-10-CM | POA: Insufficient documentation

## 2020-08-08 DIAGNOSIS — N186 End stage renal disease: Secondary | ICD-10-CM | POA: Insufficient documentation

## 2020-08-08 DIAGNOSIS — Z794 Long term (current) use of insulin: Secondary | ICD-10-CM | POA: Insufficient documentation

## 2020-08-08 DIAGNOSIS — I5042 Chronic combined systolic (congestive) and diastolic (congestive) heart failure: Secondary | ICD-10-CM | POA: Insufficient documentation

## 2020-08-08 DIAGNOSIS — R2231 Localized swelling, mass and lump, right upper limb: Secondary | ICD-10-CM | POA: Insufficient documentation

## 2020-08-08 DIAGNOSIS — M79601 Pain in right arm: Secondary | ICD-10-CM | POA: Insufficient documentation

## 2020-08-08 DIAGNOSIS — M7989 Other specified soft tissue disorders: Secondary | ICD-10-CM

## 2020-08-08 DIAGNOSIS — Z992 Dependence on renal dialysis: Secondary | ICD-10-CM | POA: Diagnosis not present

## 2020-08-08 DIAGNOSIS — E1140 Type 2 diabetes mellitus with diabetic neuropathy, unspecified: Secondary | ICD-10-CM | POA: Diagnosis not present

## 2020-08-08 DIAGNOSIS — I132 Hypertensive heart and chronic kidney disease with heart failure and with stage 5 chronic kidney disease, or end stage renal disease: Secondary | ICD-10-CM | POA: Insufficient documentation

## 2020-08-08 DIAGNOSIS — Z5321 Procedure and treatment not carried out due to patient leaving prior to being seen by health care provider: Secondary | ICD-10-CM | POA: Insufficient documentation

## 2020-08-08 DIAGNOSIS — E1122 Type 2 diabetes mellitus with diabetic chronic kidney disease: Secondary | ICD-10-CM | POA: Diagnosis not present

## 2020-08-08 DIAGNOSIS — Z7982 Long term (current) use of aspirin: Secondary | ICD-10-CM | POA: Insufficient documentation

## 2020-08-08 DIAGNOSIS — F17210 Nicotine dependence, cigarettes, uncomplicated: Secondary | ICD-10-CM | POA: Insufficient documentation

## 2020-08-08 LAB — COMPREHENSIVE METABOLIC PANEL
ALT: 9 U/L (ref 0–44)
AST: 10 U/L — ABNORMAL LOW (ref 15–41)
Albumin: 3.2 g/dL — ABNORMAL LOW (ref 3.5–5.0)
Alkaline Phosphatase: 118 U/L (ref 38–126)
Anion gap: 10 (ref 5–15)
BUN: 57 mg/dL — ABNORMAL HIGH (ref 6–20)
CO2: 21 mmol/L — ABNORMAL LOW (ref 22–32)
Calcium: 8.4 mg/dL — ABNORMAL LOW (ref 8.9–10.3)
Chloride: 104 mmol/L (ref 98–111)
Creatinine, Ser: 4.47 mg/dL — ABNORMAL HIGH (ref 0.61–1.24)
GFR, Estimated: 15 mL/min — ABNORMAL LOW (ref >60)
Glucose, Bld: 151 mg/dL — ABNORMAL HIGH (ref 70–99)
Potassium: 4.9 mmol/L (ref 3.5–5.1)
Sodium: 135 mmol/L (ref 135–145)
Total Bilirubin: 0.4 mg/dL (ref 0.3–1.2)
Total Protein: 7.8 g/dL (ref 6.5–8.1)

## 2020-08-08 LAB — CBC WITH DIFFERENTIAL/PLATELET
Abs Immature Granulocytes: 0.01 10*3/uL (ref 0.00–0.07)
Basophils Absolute: 0 10*3/uL (ref 0.0–0.1)
Basophils Relative: 0 %
Eosinophils Absolute: 0.1 10*3/uL (ref 0.0–0.5)
Eosinophils Relative: 2 %
HCT: 32.9 % — ABNORMAL LOW (ref 39.0–52.0)
Hemoglobin: 10.2 g/dL — ABNORMAL LOW (ref 13.0–17.0)
Immature Granulocytes: 0 %
Lymphocytes Relative: 26 %
Lymphs Abs: 1.5 10*3/uL (ref 0.7–4.0)
MCH: 28.3 pg (ref 26.0–34.0)
MCHC: 31 g/dL (ref 30.0–36.0)
MCV: 91.4 fL (ref 80.0–100.0)
Monocytes Absolute: 0.5 10*3/uL (ref 0.1–1.0)
Monocytes Relative: 9 %
Neutro Abs: 3.7 10*3/uL (ref 1.7–7.7)
Neutrophils Relative %: 63 %
Platelets: 235 10*3/uL (ref 150–400)
RBC: 3.6 MIL/uL — ABNORMAL LOW (ref 4.22–5.81)
RDW: 18.9 % — ABNORMAL HIGH (ref 11.5–15.5)
WBC: 5.9 10*3/uL (ref 4.0–10.5)
nRBC: 0 % (ref 0.0–0.2)

## 2020-08-08 MED ORDER — OXYCODONE-ACETAMINOPHEN 5-325 MG PO TABS
1.0000 | ORAL_TABLET | Freq: Once | ORAL | Status: AC
  Administered 2020-08-08: 1 via ORAL
  Filled 2020-08-08: qty 1
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
## 2020-08-08 NOTE — ED Notes (Signed)
Pt asleep when this nurse entered room, pt easily awaken, pain reassessed.

## 2020-08-08 NOTE — ED Notes (Signed)
Pt left, pt upset due to the wait.  I explained to pt that EDP was consulting vascular surgery and pt insisted on leaving.

## 2020-08-08 NOTE — ED Triage Notes (Signed)
Here for right arm swelling. It is hot and painful to the touch.

## 2020-08-08 NOTE — ED Notes (Signed)
Pt refused to stay to sign AMA form.

## 2020-08-08 NOTE — ED Triage Notes (Signed)
Pt with continued right arm swelling.  Seen on Friday for same

## 2020-08-09 ENCOUNTER — Emergency Department (HOSPITAL_COMMUNITY)
Admission: EM | Admit: 2020-08-09 | Discharge: 2020-08-09 | Disposition: A | Discharge status: Home / Self Care | Payer: Medicaid Other | Source: Home / Self Care

## 2020-08-10 ENCOUNTER — Observation Stay (HOSPITAL_BASED_OUTPATIENT_CLINIC_OR_DEPARTMENT_OTHER)
Admission: EM | Admit: 2020-08-10 | Discharge: 2020-08-11 | Disposition: A | Discharge status: Home / Self Care | Payer: Medicaid Other | Source: Home / Self Care | Admitting: Internal Medicine

## 2020-08-10 ENCOUNTER — Encounter (HOSPITAL_COMMUNITY): Payer: Self-pay | Admitting: *Deleted

## 2020-08-10 ENCOUNTER — Emergency Department (HOSPITAL_COMMUNITY): Payer: Medicaid Other

## 2020-08-10 ENCOUNTER — Other Ambulatory Visit: Payer: Self-pay

## 2020-08-10 ENCOUNTER — Encounter (HOSPITAL_COMMUNITY): Payer: Self-pay | Admitting: Internal Medicine

## 2020-08-10 ENCOUNTER — Observation Stay (HOSPITAL_COMMUNITY)
Admission: EM | Admit: 2020-08-10 | Discharge: 2020-08-10 | Disposition: A | Discharge status: Home / Self Care | Payer: Medicaid Other | Attending: Internal Medicine | Admitting: Internal Medicine

## 2020-08-10 DIAGNOSIS — E1365 Other specified diabetes mellitus with hyperglycemia: Secondary | ICD-10-CM

## 2020-08-10 DIAGNOSIS — E8770 Fluid overload, unspecified: Secondary | ICD-10-CM | POA: Insufficient documentation

## 2020-08-10 DIAGNOSIS — I132 Hypertensive heart and chronic kidney disease with heart failure and with stage 5 chronic kidney disease, or end stage renal disease: Secondary | ICD-10-CM | POA: Insufficient documentation

## 2020-08-10 DIAGNOSIS — R601 Generalized edema: Secondary | ICD-10-CM | POA: Diagnosis present

## 2020-08-10 DIAGNOSIS — Z79899 Other long term (current) drug therapy: Secondary | ICD-10-CM | POA: Insufficient documentation

## 2020-08-10 DIAGNOSIS — F17210 Nicotine dependence, cigarettes, uncomplicated: Secondary | ICD-10-CM | POA: Insufficient documentation

## 2020-08-10 DIAGNOSIS — N186 End stage renal disease: Secondary | ICD-10-CM | POA: Insufficient documentation

## 2020-08-10 DIAGNOSIS — E1165 Type 2 diabetes mellitus with hyperglycemia: Secondary | ICD-10-CM | POA: Insufficient documentation

## 2020-08-10 DIAGNOSIS — Z992 Dependence on renal dialysis: Secondary | ICD-10-CM | POA: Diagnosis not present

## 2020-08-10 DIAGNOSIS — D638 Anemia in other chronic diseases classified elsewhere: Secondary | ICD-10-CM | POA: Diagnosis present

## 2020-08-10 DIAGNOSIS — I2510 Atherosclerotic heart disease of native coronary artery without angina pectoris: Secondary | ICD-10-CM | POA: Insufficient documentation

## 2020-08-10 DIAGNOSIS — IMO0002 Reserved for concepts with insufficient information to code with codable children: Secondary | ICD-10-CM | POA: Diagnosis present

## 2020-08-10 DIAGNOSIS — I5043 Acute on chronic combined systolic (congestive) and diastolic (congestive) heart failure: Secondary | ICD-10-CM | POA: Insufficient documentation

## 2020-08-10 DIAGNOSIS — I10 Essential (primary) hypertension: Secondary | ICD-10-CM | POA: Diagnosis not present

## 2020-08-10 DIAGNOSIS — Z95828 Presence of other vascular implants and grafts: Secondary | ICD-10-CM | POA: Diagnosis not present

## 2020-08-10 DIAGNOSIS — E1122 Type 2 diabetes mellitus with diabetic chronic kidney disease: Secondary | ICD-10-CM | POA: Insufficient documentation

## 2020-08-10 DIAGNOSIS — Z794 Long term (current) use of insulin: Secondary | ICD-10-CM | POA: Insufficient documentation

## 2020-08-10 DIAGNOSIS — N049 Nephrotic syndrome with unspecified morphologic changes: Secondary | ICD-10-CM

## 2020-08-10 DIAGNOSIS — Z7982 Long term (current) use of aspirin: Secondary | ICD-10-CM | POA: Diagnosis not present

## 2020-08-10 DIAGNOSIS — E785 Hyperlipidemia, unspecified: Secondary | ICD-10-CM | POA: Diagnosis present

## 2020-08-10 DIAGNOSIS — M7989 Other specified soft tissue disorders: Secondary | ICD-10-CM | POA: Diagnosis present

## 2020-08-10 DIAGNOSIS — Z20822 Contact with and (suspected) exposure to covid-19: Secondary | ICD-10-CM | POA: Insufficient documentation

## 2020-08-10 DIAGNOSIS — I509 Heart failure, unspecified: Secondary | ICD-10-CM | POA: Insufficient documentation

## 2020-08-10 DIAGNOSIS — I110 Hypertensive heart disease with heart failure: Secondary | ICD-10-CM | POA: Diagnosis present

## 2020-08-10 DIAGNOSIS — E138 Other specified diabetes mellitus with unspecified complications: Secondary | ICD-10-CM

## 2020-08-10 LAB — BASIC METABOLIC PANEL
Anion gap: 14 (ref 5–15)
BUN: 66 mg/dL — ABNORMAL HIGH (ref 6–20)
CO2: 17 mmol/L — ABNORMAL LOW (ref 22–32)
Calcium: 8.1 mg/dL — ABNORMAL LOW (ref 8.9–10.3)
Chloride: 100 mmol/L (ref 98–111)
Creatinine, Ser: 4.73 mg/dL — ABNORMAL HIGH (ref 0.61–1.24)
GFR, Estimated: 14 mL/min — ABNORMAL LOW (ref >60)
Glucose, Bld: 170 mg/dL — ABNORMAL HIGH (ref 70–99)
Potassium: 4.5 mmol/L (ref 3.5–5.1)
Sodium: 131 mmol/L — ABNORMAL LOW (ref 135–145)

## 2020-08-10 LAB — RESP PANEL BY RT-PCR (FLU A&B, COVID) ARPGX2
Influenza A by PCR: NEGATIVE
Influenza B by PCR: NEGATIVE
SARS Coronavirus 2 by RT PCR: NEGATIVE

## 2020-08-10 LAB — CBC WITH DIFFERENTIAL/PLATELET
Abs Immature Granulocytes: 0.02 10*3/uL (ref 0.00–0.07)
Basophils Absolute: 0 10*3/uL (ref 0.0–0.1)
Basophils Relative: 0 %
Eosinophils Absolute: 0.1 10*3/uL (ref 0.0–0.5)
Eosinophils Relative: 1 %
HCT: 34.4 % — ABNORMAL LOW (ref 39.0–52.0)
Hemoglobin: 10.4 g/dL — ABNORMAL LOW (ref 13.0–17.0)
Immature Granulocytes: 0 %
Lymphocytes Relative: 23 %
Lymphs Abs: 1.5 10*3/uL (ref 0.7–4.0)
MCH: 28 pg (ref 26.0–34.0)
MCHC: 30.2 g/dL (ref 30.0–36.0)
MCV: 92.5 fL (ref 80.0–100.0)
Monocytes Absolute: 0.5 10*3/uL (ref 0.1–1.0)
Monocytes Relative: 8 %
Neutro Abs: 4.4 10*3/uL (ref 1.7–7.7)
Neutrophils Relative %: 68 %
Platelets: 240 10*3/uL (ref 150–400)
RBC: 3.72 MIL/uL — ABNORMAL LOW (ref 4.22–5.81)
RDW: 18.4 % — ABNORMAL HIGH (ref 11.5–15.5)
WBC: 6.6 10*3/uL (ref 4.0–10.5)
nRBC: 0 % (ref 0.0–0.2)

## 2020-08-10 LAB — CBG MONITORING, ED: Glucose-Capillary: 115 mg/dL — ABNORMAL HIGH (ref 70–99)

## 2020-08-10 MED ORDER — OXYCODONE-ACETAMINOPHEN 5-325 MG PO TABS
1.0000 | ORAL_TABLET | Freq: Once | ORAL | Status: AC
Start: 2020-08-10 — End: 2020-08-10
  Administered 2020-08-10: 1 via ORAL
  Filled 2020-08-10: qty 1

## 2020-08-10 MED ORDER — TORSEMIDE 20 MG PO TABS: 100.0000 mg | ORAL_TABLET | ORAL | Status: DC

## 2020-08-10 MED ORDER — HEPARIN SODIUM (PORCINE) 5000 UNIT/ML IJ SOLN: 5000.0000 [IU] | Freq: Three times a day (TID) | INTRAMUSCULAR | Status: DC

## 2020-08-10 MED ORDER — PHENYTOIN SODIUM EXTENDED 100 MG PO CAPS
300.0000 mg | ORAL_CAPSULE | Freq: Every day | ORAL | Status: DC
  Filled 2020-08-10: qty 3

## 2020-08-10 MED ORDER — INSULIN GLARGINE 100 UNIT/ML ~~LOC~~ SOLN
25.0000 [IU] | Freq: Every day | SUBCUTANEOUS | Status: DC
  Administered 2020-08-10: 25 [IU] via SUBCUTANEOUS
  Filled 2020-08-10 (×2): qty 0.25

## 2020-08-10 MED ORDER — VITAMIN D (ERGOCALCIFEROL) 1.25 MG (50000 UNIT) PO CAPS: 50000.0000 [IU] | ORAL_CAPSULE | ORAL | Status: DC

## 2020-08-10 MED ORDER — INSULIN GLARGINE 100 UNIT/ML ~~LOC~~ SOLN
20.0000 [IU] | Freq: Every day | SUBCUTANEOUS | Status: DC
  Filled 2020-08-10: qty 0.2

## 2020-08-10 MED ORDER — INSULIN ASPART 100 UNIT/ML ~~LOC~~ SOLN: 0.0000 [IU] | Freq: Three times a day (TID) | SUBCUTANEOUS | Status: DC

## 2020-08-10 MED ORDER — ASPIRIN EC 81 MG PO TBEC
81.0000 mg | DELAYED_RELEASE_TABLET | Freq: Every day | ORAL | Status: DC
  Administered 2020-08-11: 81 mg via ORAL
  Filled 2020-08-10: qty 1

## 2020-08-10 MED ORDER — CHLORHEXIDINE GLUCONATE CLOTH 2 % EX PADS: 6.0000 | MEDICATED_PAD | Freq: Every day | CUTANEOUS | Status: DC

## 2020-08-10 MED ORDER — ACETAMINOPHEN 325 MG PO TABS: 650.0000 mg | ORAL_TABLET | Freq: Four times a day (QID) | ORAL | Status: DC | PRN

## 2020-08-10 MED ORDER — AMLODIPINE BESYLATE 5 MG PO TABS: 5.0000 mg | ORAL_TABLET | Freq: Every day | ORAL | Status: DC

## 2020-08-10 MED ORDER — TORSEMIDE 20 MG PO TABS: 100.0000 mg | ORAL_TABLET | Freq: Every day | ORAL | Status: DC

## 2020-08-10 MED ORDER — DOXYCYCLINE HYCLATE 100 MG PO TABS
100.0000 mg | ORAL_TABLET | Freq: Two times a day (BID) | ORAL | Status: DC
  Administered 2020-08-10 – 2020-08-11 (×2): 100 mg via ORAL
  Filled 2020-08-10 (×2): qty 1

## 2020-08-10 MED ORDER — ASPIRIN EC 81 MG PO TBEC: 81.0000 mg | DELAYED_RELEASE_TABLET | Freq: Every day | ORAL | Status: DC

## 2020-08-10 MED ORDER — HYDROCODONE-ACETAMINOPHEN 5-325 MG PO TABS
1.0000 | ORAL_TABLET | Freq: Four times a day (QID) | ORAL | Status: DC | PRN
  Administered 2020-08-10: 1 via ORAL
  Filled 2020-08-10: qty 1

## 2020-08-10 MED ORDER — ATORVASTATIN CALCIUM 20 MG PO TABS: 20.0000 mg | ORAL_TABLET | Freq: Every day | ORAL | Status: DC

## 2020-08-10 MED ORDER — ACETAMINOPHEN 650 MG RE SUPP: 650.0000 mg | Freq: Four times a day (QID) | RECTAL | Status: DC | PRN

## 2020-08-10 MED ORDER — TORSEMIDE 20 MG PO TABS: 100.0000 mg | ORAL_TABLET | Freq: Once | ORAL | Status: DC

## 2020-08-10 MED ORDER — INSULIN ASPART 100 UNIT/ML ~~LOC~~ SOLN
0.0000 [IU] | Freq: Every day | SUBCUTANEOUS | Status: DC
Start: 2020-08-10 — End: 2020-08-11

## 2020-08-10 MED ORDER — PHENYTOIN SODIUM EXTENDED 100 MG PO CAPS: 300.0000 mg | ORAL_CAPSULE | Freq: Every day | ORAL | Status: DC

## 2020-08-10 MED ORDER — PANTOPRAZOLE SODIUM 40 MG PO TBEC
40.0000 mg | DELAYED_RELEASE_TABLET | Freq: Two times a day (BID) | ORAL | Status: DC
  Administered 2020-08-11: 40 mg via ORAL
  Filled 2020-08-10: qty 1

## 2020-08-10 MED ORDER — HYDROCODONE-ACETAMINOPHEN 5-325 MG PO TABS
1.0000 | ORAL_TABLET | Freq: Four times a day (QID) | ORAL | Status: DC | PRN
  Administered 2020-08-10 – 2020-08-11 (×2): 2 via ORAL
  Filled 2020-08-10 (×3): qty 2

## 2020-08-10 MED ORDER — AMLODIPINE BESYLATE 5 MG PO TABS
5.0000 mg | ORAL_TABLET | Freq: Every day | ORAL | Status: DC
  Filled 2020-08-10: qty 1

## 2020-08-10 MED ORDER — PANTOPRAZOLE SODIUM 40 MG PO TBEC: 40.0000 mg | DELAYED_RELEASE_TABLET | Freq: Two times a day (BID) | ORAL | Status: DC

## 2020-08-10 MED ORDER — SODIUM CHLORIDE 0.9 % IV SOLN
2.0000 g | Freq: Once | INTRAVENOUS | Status: AC
  Administered 2020-08-11: 2 g via INTRAVENOUS
  Filled 2020-08-10: qty 20

## 2020-08-10 NOTE — ED Triage Notes (Signed)
Pt with right arm swelling.  Pt admitted and in room and left AMA.  Pt has returned.

## 2020-08-10 NOTE — H&P (Signed)
TRH H&P   Patient Demographics:    Zachary Young, is a 60 y.o. male  MRN: [id number]   DOB - 1960/11/17  Admit Date - 08/10/2020  Outpatient Primary MD for the patient is Mellissa Kohut, DO  Referring MD/NP/PA: Dr Alvino Chapel  Outpatient Specialists:Vascular Dr early  Patient coming from: HD center.  No chief complaint on file.   This patient was seen and admitted earlier today by me earlier today under different encounter, he signed AMA, I have called him to come back and he has agreed, so he is being readmitted by me, there is no change in initial H&P and plan.   HPI:    Zachary Young  is a 60 y.o. male, with history of ESRD(TTS),diabetes mellitus type 2, hyperlipidemia, coronary artery disease, osteomyelitis status post right BKA, and tobacco abuse presenting with worsening swelling in both of his upper extremities, but mainly right upper extremity, patient had AV graft placed by Dr. Donnetta Hutching at the end of the last month, he has been getting his hemodialysis from tunneled hemodialysis catheter, patient reports 2 weeks ago he started to have some swelling, he had 2 ED visits since, first visit 4 days ago, where he was instructed to follow with vascular surgery on Monday in the office, but instead patient came to ED on Monday, where his venous Dopplers was negative for DVT, patient missed his hemodialysis on Monday, he went for dialysis today, was told by the PA at hemodialysis center to come to ED for further evaluation, patient reports dyspnea, denies fever, denies chills, reports generalized swelling, and complains of pain in right upper extremity, -Chest x-ray significant for audiometrically with increased interstitial prominence, he has no hypoxia, he is afebrile, white blood cell count at 6.6K, [facility] hospitalist consulted to admit given his need for dialysis and AVF  evaluation.     Review of systems:    In addition to the HPI above, No Fever-chills, No Headache, No changes with Vision or hearing, No problems swallowing food or Liquids, No Chest pain, Cough, he does report dyspnea No Abdominal pain, No Nausea or Vommitting, Bowel movements are regular, No Blood in stool or Urine, No dysuria, No new skin rashes or bruises, Report right forearm pain and swelling No new weakness, tingling, numbness in any extremity, No recent weight gain or loss, No polyuria, polydypsia or polyphagia, No significant Mental Stressors.  A full 10 point Review of Systems was done, except as stated above, all other Review of Systems were negative.   With Past History of the following :    Past Medical History:  Diagnosis Date  . CHF (congestive heart failure) ([place])   . Diabetes mellitus without complication ([name])   . Dialysis patient ([place])   . Peripheral edema   . Renal disorder    kidney disease      Past Surgical History:  Procedure Laterality Date  .  AMPUTATION Right 10/21/2019   Procedure: RIGHT BELOW KNEE AMPUTATION;  Surgeon: Newt Minion, MD;  Location: [facility];  Service: Orthopedics;  Laterality: Right;  . AV FISTULA PLACEMENT Left 11/13/2019   Procedure: LEFT ARTERIOVENOUS (AV) FISTULA VERSUS ARTERIOVENOUS GRAFT;  Surgeon: Rosetta Posner, MD;  Location: [facility] OR;  Service: Vascular;  Laterality: Left;  . AV FISTULA PLACEMENT Left 04/05/2020   Procedure: INSERTION OF LEFT ARM ARTERIOVENOUS (AV) GORE-TEX GRAFT;  Surgeon: Rosetta Posner, MD;  Location: [facility] OR;  Service: Vascular;  Laterality: Left;  . AV FISTULA PLACEMENT Right 07/14/2020   Procedure: RIGHT ARM ARTERIOVENOUS (AV) GRAFT CREATION;  Surgeon: Rosetta Posner, MD;  Location: [facility];  Service: Vascular;  Laterality: Right;  . BASCILIC VEIN TRANSPOSITION Left 01/27/2020   Procedure: LEFT ARM 2ND STAGE BASCILIC VEIN TRANSPOSITION;  Surgeon: Rosetta Posner, MD;  Location: [facility];  Service: Vascular;   Laterality: Left;  . DIALYSIS/PERMA CATHETER INSERTION N/A 03/03/2020   Procedure: DIALYSIS/PERMA CATHETER INSERTION;  Surgeon: Algernon Huxley, MD;  Location: [facility];  Service: Cardiovascular;  Laterality: N/A;  . ESOPHAGOGASTRODUODENOSCOPY (EGD) WITH PROPOFOL N/A 06/17/2020   gastritis, normal duodenum.   . INSERTION OF DIALYSIS CATHETER N/A 04/05/2020   Procedure: INSERTION OF TUNNEL  DIALYSIS CATHETER LEFT INTERNAL JUGULAR;  Surgeon: Rosetta Posner, MD;  Location: [facility];  Service: Vascular;  Laterality: N/A;  . IR FLUORO GUIDE CV LINE RIGHT  11/05/2019  . IR FLUORO GUIDE CV LINE RIGHT  06/07/2020  . IR REMOVAL TUN CV CATH W/O FL  06/01/2020  . IR THROMBECTOMY AV FISTULA W/THROMBOLYSIS/PTA INC/SHUNT/IMG LEFT Left 06/06/2020  . IR US GUIDE VASC ACCESS LEFT  06/06/2020  . IR US GUIDE VASC ACCESS RIGHT  11/05/2019  . IR US GUIDE VASC ACCESS RIGHT  06/07/2020  . REMOVAL OF A DIALYSIS CATHETER Right 04/05/2020   Procedure: REMOVAL OF RIGHT CHEST DIALYSIS CATHETER;  Surgeon: Rosetta Posner, MD;  Location: [facility];  Service: Vascular;  Laterality: Right;  . TOE AMPUTATION Bilateral    due to osteomyelitis, all 5 each foot      Social History:     Social History   Tobacco Use  . Smoking status: Current Every Day Smoker    Packs/day: 0.50    Types: Cigarettes  . Smokeless tobacco: Never Used  Substance Use Topics  . Alcohol use: Not Currently      Family History :     Family History  Problem Relation Age of Onset  . Cancer Mother        uknown type of cancer  . Colon cancer Neg Hx   . Colon polyps Neg Hx      Home Medications:   Prior to Admission medications   Medication Sig Start Date End Date Taking? Authorizing Provider  amLODipine (NORVASC) 5 MG tablet Take 1 tablet (5 mg total) by mouth daily. 06/28/20  Yes Tat, Shanon Brow, MD  aspirin EC 81 MG EC tablet Take 1 tablet (81 mg total) by mouth daily. Patient taking differently: Take 81 mg by mouth 2 (two) times daily. 08/21/19   Yes Gladys Damme, MD  atorvastatin (LIPITOR) 20 MG tablet Take 1 tablet (20 mg total) by mouth daily with supper. 06/28/20  Yes Tat, Shanon Brow, MD  dicyclomine (BENTYL) 20 MG tablet Take 1 tablet (20 mg total) by mouth 2 (two) times daily. 07/22/20  Yes Harris, Abigail, PA-C  ergocalciferol (VITAMIN D2) 1.25 MG (50000 UT) capsule Take 1 capsule (50,000  Units total) by mouth every Friday. 12/11/19  Yes Samella Parr, NP  insulin glargine (LANTUS) 100 UNIT/ML injection Inject 0.35 mLs (35 Units total) into the skin at bedtime. Patient taking differently: Inject 25 Units into the skin at bedtime. 12/11/19  Yes Samella Parr, NP  NOVOLOG FLEXPEN 100 UNIT/ML FlexPen Inject 4 Units into the skin 3 (three) times daily with meals. 05/24/20  Yes [provider]  pantoprazole (PROTONIX) 40 MG tablet Take 1 tablet (40 mg total) by mouth 2 (two) times daily before a meal. 08/02/20  Yes Annitta Needs, NP  phenytoin (DILANTIN) 100 MG ER capsule Take 3 capsules (300 mg total) by mouth at bedtime. 06/15/20  Yes Garvin Fila, MD  sucralfate (CARAFATE) 1 g tablet Take 1 tablet (1 g total) by mouth 4 (four) times daily -  with meals and at bedtime. 07/22/20  Yes Harris, Abigail, PA-C  torsemide (DEMADEX) 100 MG tablet Take 1 tablet (100 mg total) by mouth daily. On Monday-Wednesday-Friday-Sunday (non-dialysis days) Patient taking differently: Take 100 mg by mouth 4 (four) times a week. On Monday-Wednesday-Friday-Sunday (non-dialysis days) 06/28/20  Yes Orson Eva, MD     Allergies:     Allergies  Allergen Reactions  . Bee Venom Anaphylaxis    Per pt, nearly died from bee sting as a child  . Propofol Other (See Comments)     Hallucinations  . Eggs Or Egg-Derived Products Nausea And Vomiting and Other (See Comments)    Stomach cramps in large amounts  . Morphine Nausea And Vomiting  . Oxycodone Nausea And Vomiting     Physical Exam:   Vitals  There were no vitals taken for this visit.   1.  General developed male, laying in bed, no apparent distress  2. Normal affect and insight, Not Suicidal or Homicidal, Awake Alert, Oriented X 3.  3. No F.N deficits, ALL C.Nerves Intact, Strength 5/5 all 4 extremitie  4. Ears and Eyes appear Normal, Conjunctivae clear, PERRLA. Moist Oral Mucosa.  5. Supple Neck, No JVD, No cervical lymphadenopathy appriciated, No Carotid Bruits.  6. Symmetrical Chest wall movement, Good air movement bilaterally, CTAB.  7. RRR, No Gallops, Rubs or Murmurs, No Parasternal Heave.  8. Positive Bowel Sounds, Abdomen Soft, No tenderness, No organomegaly appriciated,No rebound -guarding or rigidity.  9.  No Cyanosis, Normal Skin Turgor, No Skin Rash or Bruise.  10. Good muscle tone,  joints appear normal , right BKA, left TMA, overall patient with generalized swelling/anasarca.  Right upper extremity with swelling, good capillary refill in the hand, could not see distinct erythema or warmth at the aVF site.  11. No Palpable Lymph Nodes in Neck or Axillae    Data Review:    CBC Recent Labs  Lab 08/06/20 1852 08/08/20 1308 08/10/20 1253  WBC 8.1 5.9 6.6  HGB 9.8* 10.2* 10.4*  HCT 30.8* 32.9* 34.4*  PLT 225 235 240  MCV 90.9 91.4 92.5  MCH 28.9 28.3 28.0  MCHC 31.8 31.0 30.2  RDW 19.1* 18.9* 18.4*  LYMPHSABS 1.8 1.5 1.5  MONOABS 0.7 0.5 0.5  EOSABS 0.2 0.1 0.1  BASOSABS 0.0 0.0 0.0   ------------------------------------------------------------------------------------------------------------------  Chemistries  Recent Labs  Lab 08/06/20 1852 08/08/20 1122 08/10/20 1253  NA 136 135 131*  K 4.5 4.9 4.5  CL 106 104 100  CO2 21* 21* 17*  GLUCOSE 126* 151* 170*  BUN 48* 57* 66*  CREATININE 3.94* 4.47* 4.73*  CALCIUM 7.5* 8.4* 8.1*  AST 13* 10*  --  ALT 10 9  --   ALKPHOS 139* 118  --   BILITOT 0.3 0.4  --    ------------------------------------------------------------------------------------------------------------------ estimated  creatinine clearance is 26.1 mL/min (A) (by C-G formula based on SCr of 4.73 mg/dL (H)). ------------------------------------------------------------------------------------------------------------------ No results for input(s): TSH, T4TOTAL, T3FREE, THYROIDAB in the last 72 hours.  Invalid input(s): FREET3  Coagulation profile No results for input(s): INR, PROTIME in the last 168 hours. ------------------------------------------------------------------------------------------------------------------- No results for input(s): DDIMER in the last 72 hours. -------------------------------------------------------------------------------------------------------------------  Cardiac Enzymes No results for input(s): CKMB, TROPONINI, MYOGLOBIN in the last 168 hours.  Invalid input(s): CK ------------------------------------------------------------------------------------------------------------------    Component Value Date/Time   BNP 385.0 (H) 06/23/2020 1248     ---------------------------------------------------------------------------------------------------------------  Urinalysis    Component Value Date/Time   COLORURINE STRAW (A) 06/23/2020 1519   APPEARANCEUR CLEAR 06/23/2020 1519   LABSPEC 1.015 06/23/2020 1519   PHURINE 7.0 06/23/2020 1519   GLUCOSEU >=500 (A) 06/23/2020 1519   HGBUR SMALL (A) 06/23/2020 1519   BILIRUBINUR NEGATIVE 06/23/2020 1519   KETONESUR NEGATIVE 06/23/2020 1519   PROTEINUR >=300 (A) 06/23/2020 1519   NITRITE NEGATIVE 06/23/2020 1519   LEUKOCYTESUR NEGATIVE 06/23/2020 1519    ----------------------------------------------------------------------------------------------------------------   Imaging Results:    DG Chest Portable 1 View  Result Date: 08/10/2020 CLINICAL DATA:  Weakness. EXAM: PORTABLE CHEST 1 VIEW FINDINGS: Dialysis catheter noted stable position. Cardiomegaly with increase and bilateral interstitial prominence suggesting  interstitial edema. Mild right mid lung subsegmental atelectasis again noted. No pleural effusion or pneumothorax. IMPRESSION: 1.  Dialysis catheter stable position. 2. Cardiomegaly with increase in bilateral interstitial prominence. Findings most consistent with interstitial edema. Electronically Signed   By: Marcello Moores  Register   On: 08/10/2020 12:58    My personal review of EKG: Rhythm NSR, Rate 82 /min, QTc 484   Assessment & Plan:    Active Problems:   Hypertensive heart disease with CHF (congestive heart failure) (HCC)   HLD (hyperlipidemia)   HTN (hypertension)   DM (diabetes mellitus), secondary, uncontrolled, with complications (HCC)   Hyperglycemia due to diabetes mellitus (HCC)   ESRD (end stage renal disease) (HCC)   Volume overload  Right upper extremity pain/swelling status post recent aVF -Is most likely in the setting of recent procedure, venous Dopplers negative for DVT on 4/25, afebrile, no leukocytosis, as discussed with vascular surgery local reaction, usually expected postoperatively, but will give 1 dose of antibiotics including Rocephin and doxycycline till patient is evaluated by vascular surgery tomorrow. -Continue with right arm elevation.  ESRD with volume overload -Patient missed hemodialysis Monday, and today, last hemodialysis on Friday. -Discussed with renal, they will dialyze first thing in the morning. - will give one dose of torsemide now.  Uncontrolled diabetes mellitus type 2 with hyperglycemia -We will resume Lantus at a lower dose 35>20, and will add insulin sliding scale  Mixedhyperlipidemia Continue with Lipitor  Acute on chronic systolic and diastolic CHF -Appears to be with some volume overload, likely from missing his hemodialysis  -5/2/21echo EF 50-55%, no WMA, trivial TR -Continue with torsemide, will give him 1 dose now  Bronchiectasis/tobacco abuse -No wheezing on exam  Hypertension -resume amlodipine  Right BKA status -PT  evaluation--no PT follow up    DVT Prophylaxis Heparin   AM Labs Ordered, also please review Full Orders  Family Communication: Admission, patients condition and plan of care including tests being ordered have been discussed with the patient  who indicate understanding and agree with the plan and Code Status.  Code  Status Full  Likely DC to  Home  Condition GUARDED    Consults called: renal    Admission status: Observation    Time spent in minutes : 60 minutes   Phillips Climes M.D on 08/10/2020 at 10:01 PM   [facility] - Office  [phone]

## 2020-08-10 NOTE — H&P (Addendum)
TRH H&P   Patient Demographics:    Zachary Young, is a 60 y.o. male  MRN: [id number]   DOB - November 04, 1960  Admit Date - 08/10/2020  Outpatient Primary MD for the patient is Mellissa Kohut, DO  Referring MD/NP/PA: Dr Alvino Chapel  Outpatient Specialists:Vascular Dr early  Patient coming from: HD center.  Chief Complaint  Patient presents with  . Edema      HPI:    Zachary Young  is a 60 y.o. male, with history of ESRD(TTS),diabetes mellitus type 2, hyperlipidemia, coronary artery disease, osteomyelitis status post right BKA, and tobacco abuse presenting with worsening swelling in both of his upper extremities, but mainly right upper extremity, patient had AV graft placed by Dr. Donnetta Hutching at the end of the last month, he has been getting his hemodialysis from tunneled hemodialysis catheter, patient reports 2 weeks ago he started to have some swelling, he had 2 ED visits since, first visit 4 days ago, where he was instructed to follow with vascular surgery on Monday in the office, but instead patient came to ED on Monday, where his venous Dopplers was negative for DVT, patient missed his hemodialysis on Monday, he went for dialysis today, was told by the PA at hemodialysis center to come to ED for further evaluation, patient reports dyspnea, denies fever, denies chills, reports generalized swelling, and complains of pain in right upper extremity, -Chest x-ray significant for audiometrically with increased interstitial prominence, he has no hypoxia, he is afebrile, white blood cell count at 6.6K, [facility] hospitalist consulted to admit given his need for dialysis and AVF evaluation.     Review of systems:    In addition to the HPI above, No Fever-chills, No Headache, No changes with Vision or hearing, No problems swallowing food or Liquids, No Chest pain, Cough, he does report dyspnea No  Abdominal pain, No Nausea or Vommitting, Bowel movements are regular, No Blood in stool or Urine, No dysuria, No new skin rashes or bruises, Report right forearm pain and swelling No new weakness, tingling, numbness in any extremity, No recent weight gain or loss, No polyuria, polydypsia or polyphagia, No significant Mental Stressors.  A full 10 point Review of Systems was done, except as stated above, all other Review of Systems were negative.   With Past History of the following :    Past Medical History:  Diagnosis Date  . CHF (congestive heart failure) ([place])   . Diabetes mellitus without complication ([place])   . Dialysis patient ([place])   . Peripheral edema   . Renal disorder    kidney disease      Past Surgical History:  Procedure Laterality Date  . AMPUTATION Right 10/21/2019   Procedure: RIGHT BELOW KNEE AMPUTATION;  Surgeon: Newt Minion, MD;  Location: [place];  Service: Orthopedics;  Laterality: Right;  . AV FISTULA PLACEMENT Left 11/13/2019   Procedure: LEFT ARTERIOVENOUS (AV)  FISTULA VERSUS ARTERIOVENOUS GRAFT;  Surgeon: Rosetta Posner, MD;  Location: [facility] OR;  Service: Vascular;  Laterality: Left;  . AV FISTULA PLACEMENT Left 04/05/2020   Procedure: INSERTION OF LEFT ARM ARTERIOVENOUS (AV) GORE-TEX GRAFT;  Surgeon: Rosetta Posner, MD;  Location: [facility] OR;  Service: Vascular;  Laterality: Left;  . AV FISTULA PLACEMENT Right 07/14/2020   Procedure: RIGHT ARM ARTERIOVENOUS (AV) GRAFT CREATION;  Surgeon: Rosetta Posner, MD;  Location: [facility];  Service: Vascular;  Laterality: Right;  . BASCILIC VEIN TRANSPOSITION Left 01/27/2020   Procedure: LEFT ARM 2ND STAGE BASCILIC VEIN TRANSPOSITION;  Surgeon: Rosetta Posner, MD;  Location: [place];  Service: Vascular;  Laterality: Left;  . DIALYSIS/PERMA CATHETER INSERTION N/A 03/03/2020   Procedure: DIALYSIS/PERMA CATHETER INSERTION;  Surgeon: Algernon Huxley, MD;  Location: [place] [facility];  Service: Cardiovascular;  Laterality: N/A;  .  ESOPHAGOGASTRODUODENOSCOPY (EGD) WITH PROPOFOL N/A 06/17/2020   gastritis, normal duodenum.   . INSERTION OF DIALYSIS CATHETER N/A 04/05/2020   Procedure: INSERTION OF TUNNEL  DIALYSIS CATHETER LEFT INTERNAL JUGULAR;  Surgeon: Rosetta Posner, MD;  Location: [place];  Service: Vascular;  Laterality: N/A;  . IR FLUORO GUIDE CV LINE RIGHT  11/05/2019  . IR FLUORO GUIDE CV LINE RIGHT  06/07/2020  . IR REMOVAL TUN CV CATH W/O FL  06/01/2020  . IR THROMBECTOMY AV FISTULA W/THROMBOLYSIS/PTA INC/SHUNT/IMG LEFT Left 06/06/2020  . IR US GUIDE VASC ACCESS LEFT  06/06/2020  . IR US GUIDE VASC ACCESS RIGHT  11/05/2019  . IR US GUIDE VASC ACCESS RIGHT  06/07/2020  . REMOVAL OF A DIALYSIS CATHETER Right 04/05/2020   Procedure: REMOVAL OF RIGHT CHEST DIALYSIS CATHETER;  Surgeon: Rosetta Posner, MD;  Location: [place];  Service: Vascular;  Laterality: Right;  . TOE AMPUTATION Bilateral    due to osteomyelitis, all 5 each foot      Social History:     Social History   Tobacco Use  . Smoking status: Current Every Day Smoker    Packs/day: 0.50    Types: Cigarettes  . Smokeless tobacco: Never Used  Substance Use Topics  . Alcohol use: Not Currently      Family History :     Family History  Problem Relation Age of Onset  . Cancer Mother        uknown type of cancer  . Colon cancer Neg Hx   . Colon polyps Neg Hx      Home Medications:   Prior to Admission medications   Medication Sig Start Date End Date Taking? Authorizing Provider  amLODipine (NORVASC) 5 MG tablet Take 1 tablet (5 mg total) by mouth daily. 06/28/20  Yes Tat, Shanon Brow, MD  aspirin EC 81 MG EC tablet Take 1 tablet (81 mg total) by mouth daily. Patient taking differently: Take 81 mg by mouth 2 (two) times daily. 08/21/19  Yes Gladys Damme, MD  atorvastatin (LIPITOR) 20 MG tablet Take 1 tablet (20 mg total) by mouth daily with supper. 06/28/20  Yes Tat, Shanon Brow, MD  dicyclomine (BENTYL) 20 MG tablet Take 1 tablet (20 mg total) by mouth 2 (two)  times daily. 07/22/20  Yes Margarita Mail, PA-C  ergocalciferol (VITAMIN D2) 1.25 MG (50000 UT) capsule Take 1 capsule (50,000 Units total) by mouth every Friday. 12/11/19  Yes Samella Parr, NP  insulin glargine (LANTUS) 100 UNIT/ML injection Inject 0.35 mLs (35 Units total) into the skin at bedtime. Patient taking differently: Inject 25 Units into the  skin at bedtime. 12/11/19  Yes Samella Parr, NP  NOVOLOG FLEXPEN 100 UNIT/ML FlexPen Inject 4 Units into the skin 3 (three) times daily with meals. 05/24/20  Yes [provider]  pantoprazole (PROTONIX) 40 MG tablet Take 1 tablet (40 mg total) by mouth 2 (two) times daily before a meal. 08/02/20  Yes Annitta Needs, NP  phenytoin (DILANTIN) 100 MG ER capsule Take 3 capsules (300 mg total) by mouth at bedtime. 06/15/20  Yes Garvin Fila, MD  sucralfate (CARAFATE) 1 g tablet Take 1 tablet (1 g total) by mouth 4 (four) times daily -  with meals and at bedtime. 07/22/20  Yes Harris, Abigail, PA-C  torsemide (DEMADEX) 100 MG tablet Take 1 tablet (100 mg total) by mouth daily. On Monday-Wednesday-Friday-Sunday (non-dialysis days) Patient taking differently: Take 100 mg by mouth 4 (four) times a week. On Monday-Wednesday-Friday-Sunday (non-dialysis days) 06/28/20  Yes Orson Eva, MD     Allergies:     Allergies  Allergen Reactions  . Bee Venom Anaphylaxis    Per pt, nearly died from bee sting as a child  . Propofol Other (See Comments)     Hallucinations  . Eggs Or Egg-Derived Products Nausea And Vomiting and Other (See Comments)    Stomach cramps in large amounts  . Morphine Nausea And Vomiting  . Oxycodone Nausea And Vomiting     Physical Exam:   Vitals  Blood pressure 138/73, pulse 71, temperature 97.9 F (36.6 C), temperature source Oral, resp. rate 14, height '6\' 3"'$  (1.905 m), weight (!) 140.6 kg, SpO2 97 %.   1. General developed male, laying in bed, no apparent distress  2. Normal affect and insight, Not Suicidal or  Homicidal, Awake Alert, Oriented X 3.  3. No F.N deficits, ALL C.Nerves Intact, Strength 5/5 all 4 extremitie  4. Ears and Eyes appear Normal, Conjunctivae clear, PERRLA. Moist Oral Mucosa.  5. Supple Neck, No JVD, No cervical lymphadenopathy appriciated, No Carotid Bruits.  6. Symmetrical Chest wall movement, Good air movement bilaterally, CTAB.  7. RRR, No Gallops, Rubs or Murmurs, No Parasternal Heave.  8. Positive Bowel Sounds, Abdomen Soft, No tenderness, No organomegaly appriciated,No rebound -guarding or rigidity.  9.  No Cyanosis, Normal Skin Turgor, No Skin Rash or Bruise.  10. Good muscle tone,  joints appear normal , right BKA, left TMA, overall patient with generalized swelling/anasarca.  Right upper extremity with swelling, good capillary refill in the hand, could not see distinct erythema or warmth at the aVF site.  11. No Palpable Lymph Nodes in Neck or Axillae    Data Review:    CBC Recent Labs  Lab 08/06/20 1852 08/08/20 1308 08/10/20 1253  WBC 8.1 5.9 6.6  HGB 9.8* 10.2* 10.4*  HCT 30.8* 32.9* 34.4*  PLT 225 235 240  MCV 90.9 91.4 92.5  MCH 28.9 28.3 28.0  MCHC 31.8 31.0 30.2  RDW 19.1* 18.9* 18.4*  LYMPHSABS 1.8 1.5 1.5  MONOABS 0.7 0.5 0.5  EOSABS 0.2 0.1 0.1  BASOSABS 0.0 0.0 0.0   ------------------------------------------------------------------------------------------------------------------  Chemistries  Recent Labs  Lab 08/06/20 1852 08/08/20 1122 08/10/20 1253  NA 136 135 131*  K 4.5 4.9 4.5  CL 106 104 100  CO2 21* 21* 17*  GLUCOSE 126* 151* 170*  BUN 48* 57* 66*  CREATININE 3.94* 4.47* 4.73*  CALCIUM 7.5* 8.4* 8.1*  AST 13* 10*  --   ALT 10 9  --   ALKPHOS 139* 118  --   BILITOT  0.3 0.4  --    ------------------------------------------------------------------------------------------------------------------ estimated creatinine clearance is 26.1 mL/min (A) (by C-G formula based on SCr of 4.73 mg/dL  (H)). ------------------------------------------------------------------------------------------------------------------ No results for input(s): TSH, T4TOTAL, T3FREE, THYROIDAB in the last 72 hours.  Invalid input(s): FREET3  Coagulation profile No results for input(s): INR, PROTIME in the last 168 hours. ------------------------------------------------------------------------------------------------------------------- No results for input(s): DDIMER in the last 72 hours. -------------------------------------------------------------------------------------------------------------------  Cardiac Enzymes No results for input(s): CKMB, TROPONINI, MYOGLOBIN in the last 168 hours.  Invalid input(s): CK ------------------------------------------------------------------------------------------------------------------    Component Value Date/Time   BNP 385.0 (H) 06/23/2020 1248     ---------------------------------------------------------------------------------------------------------------  Urinalysis    Component Value Date/Time   COLORURINE STRAW (A) 06/23/2020 1519   APPEARANCEUR CLEAR 06/23/2020 1519   LABSPEC 1.015 06/23/2020 1519   PHURINE 7.0 06/23/2020 1519   GLUCOSEU >=500 (A) 06/23/2020 1519   HGBUR SMALL (A) 06/23/2020 1519   BILIRUBINUR NEGATIVE 06/23/2020 1519   KETONESUR NEGATIVE 06/23/2020 1519   PROTEINUR >=300 (A) 06/23/2020 1519   NITRITE NEGATIVE 06/23/2020 1519   LEUKOCYTESUR NEGATIVE 06/23/2020 1519    ----------------------------------------------------------------------------------------------------------------   Imaging Results:    DG Chest Portable 1 View  Result Date: 08/10/2020 CLINICAL DATA:  Weakness. EXAM: PORTABLE CHEST 1 VIEW FINDINGS: Dialysis catheter noted stable position. Cardiomegaly with increase and bilateral interstitial prominence suggesting interstitial edema. Mild right mid lung subsegmental atelectasis again noted. No pleural  effusion or pneumothorax. IMPRESSION: 1.  Dialysis catheter stable position. 2. Cardiomegaly with increase in bilateral interstitial prominence. Findings most consistent with interstitial edema. Electronically Signed   By: Marcello Moores  Register   On: 08/10/2020 12:58    My personal review of EKG: Rhythm NSR, Rate 82 /min, QTc 484   Assessment & Plan:    Active Problems:   Anasarca   HTN (hypertension)   Anemia of chronic disease   DM (diabetes mellitus), secondary, uncontrolled, with complications (HCC)   Anasarca associated with disorder of kidney   ESRD (end stage renal disease) (HCC)   Volume overload  Right upper extremity pain/swelling status post recent aVF -Is most likely in the setting of recent procedure, venous Dopplers negative for DVT on 4/25, afebrile, no leukocytosis, as discussed with vascular surgery local reaction, usually expected postoperatively, but will give 1 dose of antibiotics including Rocephin and doxycycline till patient is evaluated by vascular surgery tomorrow. -Continue with right arm elevation.  ESRD with volume overload -Patient missed hemodialysis Monday, and today, last hemodialysis on Friday. -Discussed with renal, they will dialyze first thing in the morning. - will give one dose of torsemide now.  Uncontrolled diabetes mellitus type 2 with hyperglycemia -We will resume Lantus at a lower dose 35>20, and will add insulin sliding scale  Mixedhyperlipidemia Continue with Lipitor  Acute on chronic systolic and diastolic CHF -Appears to be with some volume overload, likely from missing his hemodialysis  -5/2/21echo EF 50-55%, no WMA, trivial TR -Continue with torsemide, will give him 1 dose now  Bronchiectasis/tobacco abuse -No wheezing on exam  Hypertension -resume amlodipine  Right BKA status -PT evaluation--no PT follow up    DVT Prophylaxis Heparin   AM Labs Ordered, also please review Full Orders  Family Communication:  Admission, patients condition and plan of care including tests being ordered have been discussed with the patient  who indicate understanding and agree with the plan and Code Status.  Code Status Full  Likely DC to  Home  Condition GUARDED    Consults called: renal    Admission  status: Observation    Time spent in minutes : 60 minutes   Phillips Climes M.D on 08/10/2020 at [place] PM   [facility] - Office  [phone]

## 2020-08-10 NOTE — ED Triage Notes (Signed)
Pt sent by dialysis PA due to generalized edema,  Pt states last dialysis was Friday.

## 2020-08-10 NOTE — ED Notes (Signed)
SWAT nurse, Kathlee Nations, at bedside to attempted Ultrasound IV.
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
## 2020-08-10 NOTE — Progress Notes (Addendum)
Patient refused cardiac monitoring, bed alarm for safety,and general admission procedures, and general assessment. Patient became very upset and belligerent and argumentative with staff and verbally abusive towards staff. Patient was yelling obscenties at staff  Explained to patient that all procedures were with the intent for patient safety.  Patient then stated that it was his body and he could do what he wanted to do, and we could not do this to him.  Explained to him again, that all procedures were safety procedures for his benefit and I had the right to do these things to the patient, for safety reasons. Explained to patient that he had a serious illness and infection and needed to be treated appropriately.   Before he was even admitted to the floor patient had refused lab draws per laboratory. Explained to patient that if he left, death could be a consequence.   Patient got angry, continued to hurl foul language at staff and decided to leave ama. Patient iv removed, and md notified.  Patient left floor in personal wheelchair.

## 2020-08-10 NOTE — ED Notes (Signed)
Patient accompanied via hoverround to room 305.

## 2020-08-10 NOTE — Progress Notes (Signed)
Refusing cardiac monitoring

## 2020-08-11 DIAGNOSIS — E7800 Pure hypercholesterolemia, unspecified: Secondary | ICD-10-CM | POA: Diagnosis not present

## 2020-08-11 DIAGNOSIS — E1365 Other specified diabetes mellitus with hyperglycemia: Secondary | ICD-10-CM

## 2020-08-11 DIAGNOSIS — I10 Essential (primary) hypertension: Secondary | ICD-10-CM

## 2020-08-11 DIAGNOSIS — E138 Other specified diabetes mellitus with unspecified complications: Secondary | ICD-10-CM

## 2020-08-11 DIAGNOSIS — N186 End stage renal disease: Secondary | ICD-10-CM | POA: Diagnosis not present

## 2020-08-11 LAB — BASIC METABOLIC PANEL
Anion gap: 11 (ref 5–15)
BUN: 72 mg/dL — ABNORMAL HIGH (ref 6–20)
CO2: 22 mmol/L (ref 22–32)
Calcium: 8.3 mg/dL — ABNORMAL LOW (ref 8.9–10.3)
Chloride: 106 mmol/L (ref 98–111)
Creatinine, Ser: 4.9 mg/dL — ABNORMAL HIGH (ref 0.61–1.24)
GFR, Estimated: 13 mL/min — ABNORMAL LOW (ref >60)
Glucose, Bld: 147 mg/dL — ABNORMAL HIGH (ref 70–99)
Potassium: 4.6 mmol/L (ref 3.5–5.1)
Sodium: 139 mmol/L (ref 135–145)

## 2020-08-11 LAB — CBC
HCT: 31 % — ABNORMAL LOW (ref 39.0–52.0)
Hemoglobin: 9.8 g/dL — ABNORMAL LOW (ref 13.0–17.0)
MCH: 28.3 pg (ref 26.0–34.0)
MCHC: 31.6 g/dL (ref 30.0–36.0)
MCV: 89.6 fL (ref 80.0–100.0)
Platelets: 244 10*3/uL (ref 150–400)
RBC: 3.46 MIL/uL — ABNORMAL LOW (ref 4.22–5.81)
RDW: 18.1 % — ABNORMAL HIGH (ref 11.5–15.5)
WBC: 6.4 10*3/uL (ref 4.0–10.5)
nRBC: 0 % (ref 0.0–0.2)

## 2020-08-11 LAB — CULTURE, BLOOD (ROUTINE X 2)
Culture: NO GROWTH
Culture: NO GROWTH
Special Requests: ADEQUATE
Special Requests: ADEQUATE

## 2020-08-11 LAB — GLUCOSE, CAPILLARY: Glucose-Capillary: 105 mg/dL — ABNORMAL HIGH (ref 70–99)

## 2020-08-11 MED ORDER — HEPARIN SODIUM (PORCINE) 1000 UNIT/ML IJ SOLN
3200.0000 [IU] | Freq: Once | INTRAMUSCULAR | Status: DC
  Filled 2020-08-11 (×2): qty 4

## 2020-08-11 MED ORDER — CHLORHEXIDINE GLUCONATE CLOTH 2 % EX PADS: 6.0000 | MEDICATED_PAD | Freq: Every day | CUTANEOUS | Status: DC

## 2020-08-11 NOTE — Procedures (Signed)
I was present at this dialysis session. I have reviewed the session itself and made appropriate changes.   Vital signs in last 24 hours:  Temp:  [97.6 F (36.4 C)-97.9 F (36.6 C)] 97.7 F (36.5 C) (04/28 1050) Pulse Rate:  [71-100] 77 (04/28 1050) Resp:  [14-18] 18 (04/28 1050) BP: (138-161)/(68-91) 140/80 (04/28 1050) SpO2:  [95 %-98 %] 97 % (04/28 1050) Weight:  [140.6 kg] 140.6 kg (04/27 1138) Weight change:  There were no vitals filed for this visit.  Recent Labs  Lab 08/11/20 0555  NA 139  K 4.6  CL 106  CO2 22  GLUCOSE 147*  BUN 72*  CREATININE 4.90*  CALCIUM 8.3*    Recent Labs  Lab 08/06/20 1852 08/08/20 1308 08/10/20 1253 08/11/20 0555  WBC 8.1 5.9 6.6 6.4  NEUTROABS 5.4 3.7 4.4  --   HGB 9.8* 10.2* 10.4* 9.8*  HCT 30.8* 32.9* 34.4* 31.0*  MCV 90.9 91.4 92.5 89.6  PLT 225 235 240 244    Scheduled Meds: . amLODipine  5 mg Oral Daily  . aspirin EC  81 mg Oral Daily  . atorvastatin  20 mg Oral Q supper  . Chlorhexidine Gluconate Cloth  6 each Topical Q0600  . doxycycline  100 mg Oral Q12H  . heparin  5,000 Units Subcutaneous Q8H  . insulin aspart  0-15 Units Subcutaneous TID WC  . insulin aspart  0-5 Units Subcutaneous QHS  . insulin glargine  25 Units Subcutaneous QHS  . pantoprazole  40 mg Oral BID AC  . phenytoin  300 mg Oral QHS  . [START ON 08/12/2020] torsemide  100 mg Oral Once per day on Sun Mon Wed Fri  . [START ON 08/12/2020] Vitamin D (Ergocalciferol)  50,000 Units Oral Q Fri   Continuous Infusions: . cefTRIAXone (ROCEPHIN)  IV     PRN Meds:.acetaminophen **OR** acetaminophen, HYDROcodone-acetaminophen   Donetta Potts,  MD 08/11/2020, 11:18 AM

## 2020-08-11 NOTE — Progress Notes (Signed)
Assumed pt care at 0700. A&O. Pt resting comfortably in bed in no acute distress. Tolerating diet. VSS. Call bell within reach. Will continue to monitor.

## 2020-08-11 NOTE — Progress Notes (Signed)
Patient readmitted to floor.  Charge RN spoke to MD about patient behavior and his history of leaving the floor AMA.  Charge RN asked MD to speak to patient about his behavior and that we as medical staff have to perform basic care to patients for safety precautions.  MD stated he had spoken with patient and encouraged him to stay and seek care.

## 2020-08-11 NOTE — Progress Notes (Signed)
Pt was found outside smoking in his wheelchair. Security spoke to pt regarding nonsmoking policy. Pt verbalized understanding. MD notified. Will continue to monitor.

## 2020-08-11 NOTE — Plan of Care (Signed)

## 2020-08-11 NOTE — Progress Notes (Addendum)
Pt left w/o allowing dialysis nurse to heparinize port. Instructed pt to wait for dialysis nurse to return to the floor to complete heparinization of port. Instructed pt on the risks of catheter clotting. Pt verbalized understanding. Pt refused and left the facility. MD unable to contact dialysis nurse. MD aware of pt's departure.  Addendum: Dialysis nurse contacted and reported that she did in fact heparinize the pt's port before he departed. This message was relayed to me  Via MD.

## 2020-08-11 NOTE — Progress Notes (Signed)
Patient ID: Zachary Young, male   DOB: 11/23/1960, 60 y.o.   MRN: [id number]                                     [facility] [place]  Patient name: Zachary Young MRN: [id number] DOB: Oct 15, 1960 Sex: male    HPI: Zachary Young is a 60 y.o. male seen in consultation at [facility].  He is known to me from prior right forearm loop graft placement as an outpatient on 07/14/2020.  He had had failed left arm access in the past.  Has had difficulty with noncompliance and is missed several dialysis sessions.  He presented to the emergency room with swelling in his right arm.  He was admitted to hospital service for antibiotics.  He is seen today for vascular follow-up as an inpatient  Current Facility-Administered Medications  Medication Dose Route Frequency Provider Last Rate Last Admin  . acetaminophen (TYLENOL) tablet 650 mg  650 mg Oral Q6H PRN Elgergawy, Silver Huguenin, MD       Or  . acetaminophen (TYLENOL) suppository 650 mg  650 mg Rectal Q6H PRN Elgergawy, Silver Huguenin, MD      . amLODipine (NORVASC) tablet 5 mg  5 mg Oral Daily Elgergawy, Silver Huguenin, MD      . aspirin EC tablet 81 mg  81 mg Oral Daily Elgergawy, Silver Huguenin, MD      . atorvastatin (LIPITOR) tablet 20 mg  20 mg Oral Q supper Elgergawy, Silver Huguenin, MD      . cefTRIAXone (ROCEPHIN) 2 g in sodium chloride 0.9 % 100 mL IVPB  2 g Intravenous Once Elgergawy, Silver Huguenin, MD      . doxycycline (VIBRA-TABS) tablet 100 mg  100 mg Oral Q12H Elgergawy, Silver Huguenin, MD   100 mg at 08/10/20 2257  . heparin injection 5,000 Units  5,000 Units Subcutaneous Q8H Elgergawy, Silver Huguenin, MD      . HYDROcodone-acetaminophen (NORCO/VICODIN) 5-325 MG per tablet 1-2 tablet  1-2 tablet Oral Q6H PRN Elgergawy, Silver Huguenin, MD   2 tablet at 08/11/20 [phone]  . insulin aspart (novoLOG) injection 0-15 Units  0-15 Units Subcutaneous TID WC Elgergawy, Emeline Gins S, MD      . insulin aspart (novoLOG) injection 0-5 Units  0-5 Units Subcutaneous QHS Elgergawy,  Dawood S, MD      . insulin glargine (LANTUS) injection 25 Units  25 Units Subcutaneous QHS Elgergawy, Silver Huguenin, MD   25 Units at 08/10/20 2328  . pantoprazole (PROTONIX) EC tablet 40 mg  40 mg Oral BID AC Elgergawy, Silver Huguenin, MD      . phenytoin (DILANTIN) ER capsule 300 mg  300 mg Oral QHS Elgergawy, Silver Huguenin, MD      . Derrill Memo ON 08/12/2020] torsemide [facility]) tablet 100 mg  100 mg Oral Once per day on Sun Mon Wed Fri Elgergawy, Silver Huguenin, MD      . Derrill Memo ON 08/12/2020] Vitamin D (Ergocalciferol) (DRISDOL) capsule 50,000 Units  50,000 Units Oral Q Fri Elgergawy, Silver Huguenin, MD         PHYSICAL EXAM: Vitals:   08/10/20 2201 08/10/20 2300 08/11/20 0651  BP: (!) 161/75 (!) 158/91 138/68  Pulse: 94 100 79  Resp: 18  17  Temp: 97.9 F (36.6 C) 97.6 F (36.4 C) 97.8 F (36.6 C)  TempSrc: Oral Oral   SpO2: 98%  95%  GENERAL: The patient is a well-nourished male, in no acute distress. The vital signs are documented above. He does have a right radial pulse.  Does have moderate swelling from mid upper arm into his hand.  Also has swelling in his left hand.  He does have palpable thrill in his left forearm loop AV Gore-Tex graft.  There is no erythema and no wound issues.  He underwent venous duplex yesterday which showed no DVT and confirmed the graft is patent.  I did not see any evidence duplex of central occlusion.  MEDICAL ISSUES: No evidence of infection or steal.  Suspect this is related to inflammation from access placement.  It is possible that he has central venous stenosis or occlusion.  Does have a right IJ tunneled catheter currently.  Would recommend continued observation only for now.  I will see him again in our office in 2 to 3 weeks.  If he is having persistent swelling, will plan a shuntogram to rule out central occlusion or stenosis.  This could be treated with angioplasty.  I do not see any indication for inpatient vascular needs.  Will see again in several weeks as an  outpatient.  Discussed with Dr.Memon   Rosetta Posner, MD FACS [facility] [place] Office Tel [phone]  Note: Portions of this report may have been transcribed using voice recognition software.  Every effort has been made to ensure accuracy; however, inadvertent computerized transcription errors may still be present.

## 2020-08-11 NOTE — Consult Note (Signed)
[facility] Renal Consultation Note    Indication for Consultation:  Management of ESRD/hemodialysis; anemia, hypertension/volume and secondary hyperparathyroidism  HPI: Zachary Young is a 60 y.o. male with a PMH significant for HTN, DM type 2, chronic combined systolic and diastolic CHF, HTN, PAD s/p RBKA, CAD, polysubstance abuse, and ESRD on HD MWF at [facility] who presented with worsening right arm swelling and discomfort after he had an right forearm AVG placed 1 month ago.  He was to follow up with Dr. Donnetta Hutching, however he came to [facility] ED instead.  He missed HD on Monday and we were consulted to provide HD during his hospitalization.  He has a functioning RIJ TDC.  HD orders had been placed yesterday, however he left AMA only to return later last night.  He denies any N/V, SOB, CP.  Past Medical History:  Diagnosis Date  . CHF (congestive heart failure) ([place])   . Diabetes mellitus without complication ([place])   . Dialysis patient ([place])   . Peripheral edema   . Renal disorder    kidney disease   Past Surgical History:  Procedure Laterality Date  . AMPUTATION Right 10/21/2019   Procedure: RIGHT BELOW KNEE AMPUTATION;  Surgeon: Newt Minion, MD;  Location: [place];  Service: Orthopedics;  Laterality: Right;  . AV FISTULA PLACEMENT Left 11/13/2019   Procedure: LEFT ARTERIOVENOUS (AV) FISTULA VERSUS ARTERIOVENOUS GRAFT;  Surgeon: Rosetta Posner, MD;  Location: [facility] OR;  Service: Vascular;  Laterality: Left;  . AV FISTULA PLACEMENT Left 04/05/2020   Procedure: INSERTION OF LEFT ARM ARTERIOVENOUS (AV) GORE-TEX GRAFT;  Surgeon: Rosetta Posner, MD;  Location: [facility] OR;  Service: Vascular;  Laterality: Left;  . AV FISTULA PLACEMENT Right 07/14/2020   Procedure: RIGHT ARM ARTERIOVENOUS (AV) GRAFT CREATION;  Surgeon: Rosetta Posner, MD;  Location: [facility];  Service: Vascular;  Laterality: Right;  . BASCILIC VEIN TRANSPOSITION Left 01/27/2020   Procedure: LEFT ARM 2ND STAGE BASCILIC VEIN  TRANSPOSITION;  Surgeon: Rosetta Posner, MD;  Location: [place];  Service: Vascular;  Laterality: Left;  . DIALYSIS/PERMA CATHETER INSERTION N/A 03/03/2020   Procedure: DIALYSIS/PERMA CATHETER INSERTION;  Surgeon: Algernon Huxley, MD;  Location: [place] [facility];  Service: Cardiovascular;  Laterality: N/A;  . ESOPHAGOGASTRODUODENOSCOPY (EGD) WITH PROPOFOL N/A 06/17/2020   gastritis, normal duodenum.   . INSERTION OF DIALYSIS CATHETER N/A 04/05/2020   Procedure: INSERTION OF TUNNEL  DIALYSIS CATHETER LEFT INTERNAL JUGULAR;  Surgeon: Rosetta Posner, MD;  Location: [place];  Service: Vascular;  Laterality: N/A;  . IR FLUORO GUIDE CV LINE RIGHT  11/05/2019  . IR FLUORO GUIDE CV LINE RIGHT  06/07/2020  . IR REMOVAL TUN CV CATH W/O FL  06/01/2020  . IR THROMBECTOMY AV FISTULA W/THROMBOLYSIS/PTA INC/SHUNT/IMG LEFT Left 06/06/2020  . IR US GUIDE VASC ACCESS LEFT  06/06/2020  . IR US GUIDE VASC ACCESS RIGHT  11/05/2019  . IR US GUIDE VASC ACCESS RIGHT  06/07/2020  . REMOVAL OF A DIALYSIS CATHETER Right 04/05/2020   Procedure: REMOVAL OF RIGHT CHEST DIALYSIS CATHETER;  Surgeon: Rosetta Posner, MD;  Location: [place];  Service: Vascular;  Laterality: Right;  . TOE AMPUTATION Bilateral    due to osteomyelitis, all 5 each foot   Family History:   Family History  Problem Relation Age of Onset  . Cancer Mother        uknown type of cancer  . Colon cancer Neg Hx   . Colon polyps Neg Hx  Social History:  reports that he has been smoking cigarettes. He has been smoking about 0.50 packs per day. He has never used smokeless tobacco. He reports previous alcohol use. He reports previous drug use. Drug: Cocaine. Allergies  Allergen Reactions  . Bee Venom Anaphylaxis    Per pt, nearly died from bee sting as a child  . Propofol Other (See Comments)     Hallucinations  . Eggs Or Egg-Derived Products Nausea And Vomiting and Other (See Comments)    Stomach cramps in large amounts  . Morphine Nausea And Vomiting  .  Oxycodone Nausea And Vomiting   Prior to Admission medications   Medication Sig Start Date End Date Taking? Authorizing Provider  amLODipine (NORVASC) 5 MG tablet Take 1 tablet (5 mg total) by mouth daily. 06/28/20  Yes Tat, Shanon Brow, MD  aspirin EC 81 MG EC tablet Take 1 tablet (81 mg total) by mouth daily. Patient taking differently: Take 81 mg by mouth 2 (two) times daily. 08/21/19  Yes Gladys Damme, MD  atorvastatin (LIPITOR) 20 MG tablet Take 1 tablet (20 mg total) by mouth daily with supper. 06/28/20  Yes Tat, Shanon Brow, MD  dicyclomine (BENTYL) 20 MG tablet Take 1 tablet (20 mg total) by mouth 2 (two) times daily. 07/22/20  Yes Margarita Mail, PA-C  ergocalciferol (VITAMIN D2) 1.25 MG (50000 UT) capsule Take 1 capsule (50,000 Units total) by mouth every Friday. 12/11/19  Yes Samella Parr, NP  insulin glargine (LANTUS) 100 UNIT/ML injection Inject 0.35 mLs (35 Units total) into the skin at bedtime. Patient taking differently: Inject 25 Units into the skin at bedtime. 12/11/19  Yes Samella Parr, NP  NOVOLOG FLEXPEN 100 UNIT/ML FlexPen Inject 4 Units into the skin 3 (three) times daily with meals. 05/24/20  Yes [provider]  pantoprazole (PROTONIX) 40 MG tablet Take 1 tablet (40 mg total) by mouth 2 (two) times daily before a meal. 08/02/20  Yes Annitta Needs, NP  phenytoin (DILANTIN) 100 MG ER capsule Take 3 capsules (300 mg total) by mouth at bedtime. 06/15/20  Yes Garvin Fila, MD  sucralfate (CARAFATE) 1 g tablet Take 1 tablet (1 g total) by mouth 4 (four) times daily -  with meals and at bedtime. 07/22/20  Yes Harris, Abigail, PA-C  torsemide (DEMADEX) 100 MG tablet Take 1 tablet (100 mg total) by mouth daily. On Monday-Wednesday-Friday-Sunday (non-dialysis days) Patient taking differently: Take 100 mg by mouth 4 (four) times a week. On Monday-Wednesday-Friday-Sunday (non-dialysis days) 06/28/20  Yes TatShanon Brow, MD   Current Facility-Administered Medications  Medication Dose Route  Frequency Provider Last Rate Last Admin  . acetaminophen (TYLENOL) tablet 650 mg  650 mg Oral Q6H PRN Elgergawy, Silver Huguenin, MD       Or  . acetaminophen (TYLENOL) suppository 650 mg  650 mg Rectal Q6H PRN Elgergawy, Silver Huguenin, MD      . amLODipine (NORVASC) tablet 5 mg  5 mg Oral Daily Elgergawy, Silver Huguenin, MD      . aspirin EC tablet 81 mg  81 mg Oral Daily Elgergawy, Silver Huguenin, MD   81 mg at 08/11/20 1033  . atorvastatin (LIPITOR) tablet 20 mg  20 mg Oral Q supper Elgergawy, Silver Huguenin, MD      . cefTRIAXone (ROCEPHIN) 2 g in sodium chloride 0.9 % 100 mL IVPB  2 g Intravenous Once Elgergawy, Silver Huguenin, MD      . Chlorhexidine Gluconate Cloth 2 % PADS 6 each  6 each Topical  [id number] Donato Heinz, MD      . doxycycline (VIBRA-TABS) tablet 100 mg  100 mg Oral Q12H Elgergawy, Silver Huguenin, MD   100 mg at 08/11/20 1035  . heparin injection 5,000 Units  5,000 Units Subcutaneous Q8H Elgergawy, Silver Huguenin, MD      . HYDROcodone-acetaminophen (NORCO/VICODIN) 5-325 MG per tablet 1-2 tablet  1-2 tablet Oral Q6H PRN Elgergawy, Silver Huguenin, MD   2 tablet at 08/11/20 [phone]  . insulin aspart (novoLOG) injection 0-15 Units  0-15 Units Subcutaneous TID WC Elgergawy, Emeline Gins S, MD      . insulin aspart (novoLOG) injection 0-5 Units  0-5 Units Subcutaneous QHS Elgergawy, Dawood S, MD      . insulin glargine (LANTUS) injection 25 Units  25 Units Subcutaneous QHS Elgergawy, Silver Huguenin, MD   25 Units at 08/10/20 2328  . pantoprazole (PROTONIX) EC tablet 40 mg  40 mg Oral BID AC Elgergawy, Silver Huguenin, MD   40 mg at 08/11/20 1034  . phenytoin (DILANTIN) ER capsule 300 mg  300 mg Oral QHS Elgergawy, Silver Huguenin, MD      . Derrill Memo ON 08/12/2020] torsemide [facility]) tablet 100 mg  100 mg Oral Once per day on Sun Mon Wed Fri Elgergawy, Silver Huguenin, MD      . Derrill Memo ON 08/12/2020] Vitamin D (Ergocalciferol) (DRISDOL) capsule 50,000 Units  50,000 Units Oral Q Joella Prince, MD       Labs: Basic Metabolic Panel: Recent Labs  Lab  08/08/20 1122 08/10/20 1253 08/11/20 0555  NA 135 131* 139  K 4.9 4.5 4.6  CL 104 100 106  CO2 21* 17* 22  GLUCOSE 151* 170* 147*  BUN 57* 66* 72*  CREATININE 4.47* 4.73* 4.90*  CALCIUM 8.4* 8.1* 8.3*   Liver Function Tests: Recent Labs  Lab 08/06/20 1852 08/08/20 1122  AST 13* 10*  ALT 10 9  ALKPHOS 139* 118  BILITOT 0.3 0.4  PROT 7.3 7.8  ALBUMIN 3.0* 3.2*   Recent Labs  Lab 08/06/20 1852  LIPASE 77*   No results for input(s): AMMONIA in the last 168 hours. CBC: Recent Labs  Lab 08/06/20 1852 08/08/20 1308 08/10/20 1253 08/11/20 0555  WBC 8.1 5.9 6.6 6.4  NEUTROABS 5.4 3.7 4.4  --   HGB 9.8* 10.2* 10.4* 9.8*  HCT 30.8* 32.9* 34.4* 31.0*  MCV 90.9 91.4 92.5 89.6  PLT 225 235 240 244   Cardiac Enzymes: No results for input(s): CKTOTAL, CKMB, CKMBINDEX, TROPONINI in the last 168 hours. CBG: Recent Labs  Lab 08/10/20 1835  GLUCAP 115*   Iron Studies: No results for input(s): IRON, TIBC, TRANSFERRIN, FERRITIN in the last 72 hours. Studies/Results: DG Chest Portable 1 View  Result Date: 08/10/2020 CLINICAL DATA:  Weakness. EXAM: PORTABLE CHEST 1 VIEW FINDINGS: Dialysis catheter noted stable position. Cardiomegaly with increase and bilateral interstitial prominence suggesting interstitial edema. Mild right mid lung subsegmental atelectasis again noted. No pleural effusion or pneumothorax. IMPRESSION: 1.  Dialysis catheter stable position. 2. Cardiomegaly with increase in bilateral interstitial prominence. Findings most consistent with interstitial edema. Electronically Signed   By: Marcello Moores  Register   On: 08/10/2020 12:58    ROS: Pertinent items are noted in HPI. Physical Exam: Vitals:   08/10/20 2201 08/10/20 2300 08/11/20 0651 08/11/20 1050  BP: (!) 161/75 (!) 158/91 138/68 140/80  Pulse: 94 100 79 77  Resp: '18  17 18  '$ Temp: 97.9 F (36.6 C) 97.6 F (36.4 C) 97.8 F (36.6 C) 97.7 F (36.5  C)  TempSrc: Oral Oral  Oral  SpO2: 98%  95% 97%       Weight change:  No intake or output data in the 24 hours ending 08/11/20 1118 BP 140/80   Pulse 77   Temp 97.7 F (36.5 C) (Oral)   Resp 18   SpO2 97%  General appearance: alert, cooperative and no distress Head: Normocephalic, without obvious abnormality, atraumatic Resp: clear to auscultation bilaterally Cardio: regular rate and rhythm, S1, S2 normal, no murmur, click, rub or gallop GI: soft, non-tender; bowel sounds normal; no masses,  no organomegaly Extremities: bilateral arm swelling, R forearm AVG +T/B, tender, warm, s/p RBKA, no edema of left leg or right stump.  Dialysis Access:  Dialysis Orders: Center: [facility]  on MWF . EDW 123 kg HD Bath 3K/2.5 Ca Time 255 min  Heparin 1500 units bolus. Access RIJ TDC BFR 400 DFR 500    Epogen 4400 Units IV/HD   Assessment/Plan: 1.  Swelling of upper extremities - evaluated by Dr. Donnetta Hutching and no evidence of infection or steal.  Felt to be inflammation from access placement.  No need for antibiotics and will f/u with Dr. Donnetta Hutching in 2-3 weeks. 2.  ESRD -  Plan for HD today and again tomorrow if he is not discharged after HD today. 3.  Hypertension/volume  - markedly volume overloaded and he admits that they are not able to remove all of his fluids due to drops in his bp, however he has also missed 2 HD sessions this week.  UF as tolerated. 4.  Anemia  - continue with ESA 5.  Metabolic bone disease -   Continue with home meds 6.  Nutrition - renal diet 7. Disposition - possible discharge today after HD and f/u with outpatient clinic tomorrow for his regular HD session.   Pending primary svc evaluation.   Donetta Potts, MD [place] Pager [phone] 08/11/2020, 11:18 AM

## 2020-08-11 NOTE — Progress Notes (Signed)
Pt discharged in stable condition via personal wheelchair into the care of family/friends via private vehicle. VSS. Dialysis completed today. Discharge instructions reviewed with pt. Pt verbalized understanding. Port-a-cath heparinized by dialysis nurse after dialysis completed today. Belongings sent with pt.

## 2020-08-13 NOTE — Discharge Summary (Signed)
Physician Discharge Summary  Zachary Young [id number] DOB: 1960-08-06 DOA: 08/10/2020  PCP: Mellissa Kohut, DO  Admit date: 08/10/2020 Discharge date: 08/11/2020  Admitted From: Home Disposition: Home  Recommendations for Outpatient Follow-up:  1. Follow up with PCP in 1-2 weeks 2. Please obtain BMP/CBC in one week 3. Patient will follow-up at regular dialysis center on 4/29 4. He will be followed up by vascular surgery next 1 to 2 weeks regarding his graft  Discharge Condition: Stable CODE STATUS: Full code Diet recommendation: Heart healthy, carb modified  Brief/Interim Summary: 60 year old male with history of end-stage renal disease on hemodialysis, diabetes, history of right BKA, presents to the emergency room with worsening swelling in both of his upper extremities.  He had missed his last 2 dialysis sessions.  He recently had an AV graft placed by vascular surgery at the end of last month.  He reported worsening swelling and pain around his graft site.  Patient was admitted to the hospital for further evaluation.  Venous Dopplers were negative for DVT.  He was followed up by Dr. Donnetta Hutching who performed his surgery who did not feel that redness around graft site was related to infection.  He suspected that clinical findings related to inflammation from access placement.  He arranged close follow-up in the next 2 to 3 weeks for the patient.  At that point if he continues to have significant swelling, shuntogram could rule out occlusion or stenosis.  He was seen by nephrology and underwent dialysis.  After dialysis, patient was not short of breath and was anxious to discharge home.  Discharge Diagnoses:  Active Problems:   Hypertensive heart disease with CHF (congestive heart failure) (HCC)   HLD (hyperlipidemia)   HTN (hypertension)   DM (diabetes mellitus), secondary, uncontrolled, with complications (HCC)   Hyperglycemia due to diabetes mellitus (HCC)   ESRD (end stage renal disease)  (HCC)   Volume overload    Discharge Instructions  Discharge Instructions    Diet - low sodium heart healthy   Complete by: As directed    Increase activity slowly   Complete by: As directed      Allergies as of 08/11/2020      Reactions   Bee Venom Anaphylaxis   Per pt, nearly died from bee sting as a child   Propofol Other (See Comments)    Hallucinations   Eggs Or Egg-derived Products Nausea And Vomiting, Other (See Comments)   Stomach cramps in large amounts   Morphine Nausea And Vomiting   Oxycodone Nausea And Vomiting      Medication List    TAKE these medications   amLODipine 5 MG tablet Commonly known as: NORVASC Take 1 tablet (5 mg total) by mouth daily.   aspirin 81 MG EC tablet Take 1 tablet (81 mg total) by mouth daily. What changed: when to take this   atorvastatin 20 MG tablet Commonly known as: LIPITOR Take 1 tablet (20 mg total) by mouth daily with supper.   dicyclomine 20 MG tablet Commonly known as: BENTYL Take 1 tablet (20 mg total) by mouth 2 (two) times daily.   ergocalciferol 1.25 MG (50000 UT) capsule Commonly known as: VITAMIN D2 Take 1 capsule (50,000 Units total) by mouth every Friday.   insulin glargine 100 UNIT/ML injection Commonly known as: LANTUS Inject 0.35 mLs (35 Units total) into the skin at bedtime. What changed: how much to take   NovoLOG FlexPen 100 UNIT/ML FlexPen Generic drug: insulin aspart Inject 4 Units into the skin  3 (three) times daily with meals.   pantoprazole 40 MG tablet Commonly known as: PROTONIX Take 1 tablet (40 mg total) by mouth 2 (two) times daily before a meal.   phenytoin 100 MG ER capsule Commonly known as: DILANTIN Take 3 capsules (300 mg total) by mouth at bedtime.   sucralfate 1 g tablet Commonly known as: Carafate Take 1 tablet (1 g total) by mouth 4 (four) times daily -  with meals and at bedtime.   torsemide 100 MG tablet Commonly known as: DEMADEX Take 1 tablet (100 mg total) by  mouth daily. On Monday-Wednesday-Friday-Sunday (non-dialysis days) What changed: when to take this       Follow-up Information    Early, Arvilla Meres, MD. Schedule an appointment as soon as possible for a visit in 2 week(s).   Specialties: Vascular Surgery, Cardiology Contact information: [address] [place] [address] [phone]        follow up tomorrow at regular outpatient dialysis center for schedule dialysis Follow up.              Allergies  Allergen Reactions  . Bee Venom Anaphylaxis    Per pt, nearly died from bee sting as a child  . Propofol Other (See Comments)     Hallucinations  . Eggs Or Egg-Derived Products Nausea And Vomiting and Other (See Comments)    Stomach cramps in large amounts  . Morphine Nausea And Vomiting  . Oxycodone Nausea And Vomiting    Consultations:  Nephrology  Vascular surgery   Procedures/Studies: CT ABDOMEN PELVIS WO CONTRAST  Result Date: 07/22/2020 CLINICAL DATA:  Abdominal pain with vomiting. EXAM: CT ABDOMEN AND PELVIS WITHOUT CONTRAST TECHNIQUE: Multidetector CT imaging of the abdomen and pelvis was performed following the standard protocol without IV contrast. COMPARISON:  06/23/2020 FINDINGS: Lower chest: Mosaic attenuation in the lung bases is nonspecific but likely reflects air trapping from small airways disease. Hepatobiliary: No focal abnormality in the liver on this study without intravenous contrast. Gallbladder is surgically absent. No intrahepatic or extrahepatic biliary dilation. Pancreas: No focal mass lesion. No dilatation of the main duct. No intraparenchymal cyst. No peripancreatic edema. Spleen: No splenomegaly. No focal mass lesion. Adrenals/Urinary Tract: No adrenal nodule or mass. Kidneys unremarkable. No evidence for hydroureter. The urinary bladder appears normal for the degree of distention. Stomach/Bowel: Stomach is unremarkable. No gastric wall thickening. No evidence of outlet obstruction. Duodenum is  normally positioned as is the ligament of Treitz. No small bowel wall thickening. No small bowel dilatation. The terminal ileum is normal. The appendix is normal. No gross colonic mass. No colonic wall thickening. Scattered areas of peristalsis are seen in transverse and left colon. Vascular/Lymphatic: There is abdominal aortic atherosclerosis without aneurysm. There is no gastrohepatic or hepatoduodenal ligament lymphadenopathy. No retroperitoneal or mesenteric lymphadenopathy. 11 mm low left para-aortic node on image 53/series 2 is stable in the interval and also unchanged compared back to a study from 12/14/2018 suggesting reactive etiology. Tiny lymph nodes are seen along the external iliac chain bilaterally, also stable since 2020. Upper normal to mildly enlarged bilateral groin lymph nodes are stable since 12/14/2018. Reproductive: The prostate gland and seminal vesicles are unremarkable. Other: No intraperitoneal free fluid. Musculoskeletal: Small right groin hernia contains only fat. No worrisome lytic or sclerotic osseous abnormality. IMPRESSION: 1. No acute findings in the abdomen or pelvis. Specifically, no findings to explain the patient's history of abdominal pain with nausea and vomiting. 2. Subtle peripancreatic edema seen on the previous study  has resolved in the interval. 3. Stable borderline to mild bilateral groin lymphadenopathy is stable comparing back to 2020, suggesting reactive etiology. 4. Small right groin hernia contains only fat. 5. Aortic Atherosclerosis (ICD10-I70.0). Electronically Signed   By: Misty Stanley M.D.   On: 07/22/2020 18:06   US Venous Img Upper Right (DVT Study)  Result Date: 08/08/2020 CLINICAL DATA:  Right upper extremity swelling. EXAM: Right UPPER EXTREMITY VENOUS DOPPLER ULTRASOUND TECHNIQUE: Gray-scale sonography with graded compression, as well as color Doppler and duplex ultrasound were performed to evaluate the upper extremity deep venous system from the level  of the subclavian vein and including the jugular, axillary, basilic, radial, ulnar and upper cephalic vein. Spectral Doppler was utilized to evaluate flow at rest and with distal augmentation maneuvers. COMPARISON:  None. FINDINGS: Contralateral Subclavian Vein: Respiratory phasicity is normal and symmetric with the symptomatic side. No evidence of thrombus. Normal compressibility. Internal Jugular Vein: No evidence of thrombus. Normal compressibility, respiratory phasicity and response to augmentation. Subclavian Vein: No evidence of thrombus. Normal compressibility, respiratory phasicity and response to augmentation. Axillary Vein: No evidence of thrombus. Normal compressibility, respiratory phasicity and response to augmentation. Cephalic Vein: No evidence of thrombus. Normal compressibility, respiratory phasicity and response to augmentation. Basilic Vein: No evidence of thrombus. Normal compressibility, respiratory phasicity and response to augmentation. Brachial Veins: No evidence of thrombus. Normal compressibility, respiratory phasicity and response to augmentation. Radial Veins: No evidence of thrombus. Normal compressibility, respiratory phasicity and response to augmentation. Ulnar Veins: No evidence of thrombus. Normal compressibility, respiratory phasicity and response to augmentation. Venous Reflux:  None visualized. Other Findings: Arteriovenous fistula is seen involving the right forearm and antecubital fossa which appears to be patent. IMPRESSION: No evidence of DVT within the right upper extremity. Electronically Signed   By: Marijo Conception M.D.   On: 08/08/2020 13:00   DG Chest Portable 1 View  Result Date: 08/10/2020 CLINICAL DATA:  Weakness. EXAM: PORTABLE CHEST 1 VIEW FINDINGS: Dialysis catheter noted stable position. Cardiomegaly with increase and bilateral interstitial prominence suggesting interstitial edema. Mild right mid lung subsegmental atelectasis again noted. No pleural effusion  or pneumothorax. IMPRESSION: 1.  Dialysis catheter stable position. 2. Cardiomegaly with increase in bilateral interstitial prominence. Findings most consistent with interstitial edema. Electronically Signed   By: Marcello Moores  Register   On: 08/10/2020 12:58   DG Chest Portable 1 View  Result Date: 08/06/2020 CLINICAL DATA:  Shortness of breath EXAM: PORTABLE CHEST 1 VIEW COMPARISON:  06/23/2020 FINDINGS: Right IJ hemodialysis catheter brains in place. Stable mild cardiomegaly. Mild vascular congestion. Mild diffuse interstitial prominence. No lobar consolidation. No significant pleural fluid collection. No pneumothorax. IMPRESSION: Findings suggestive of CHF with mild interstitial edema. Electronically Signed   By: Davina Poke D.O.   On: 08/06/2020 18:52       Subjective: Denies any shortness of breath.  Wants to go home after dialysis.  Discharge Exam: Vitals:   08/11/20 1300 08/11/20 1330 08/11/20 1400 08/11/20 1426  BP: 115/60 (!) 105/55 (!) 87/55 104/62  Pulse: 80 80 82 80  Resp: '18 18 18 18  '$ Temp:    98 F (36.7 C)  TempSrc:    Oral  SpO2:    98%  Weight:    130 kg    General: Pt is alert, awake, not in acute distress Cardiovascular: RRR, S1/S2 +, no rubs, no gallops Respiratory: CTA bilaterally, no wheezing, no rhonchi Abdominal: Soft, NT, ND, bowel sounds + Extremities: no edema, no cyanosis  The results of significant diagnostics from this hospitalization (including imaging, microbiology, ancillary and laboratory) are listed below for reference.     Microbiology: Recent Results (from the past 240 hour(s))  Culture, blood (routine x 2)     Status: None   Collection Time: 08/06/20  6:11 PM   Specimen: BLOOD LEFT FOREARM  Result Value Ref Range Status   Specimen Description BLOOD LEFT FOREARM  Final   Special Requests   Final    BOTTLES DRAWN AEROBIC AND ANAEROBIC Blood Culture adequate volume   Culture   Final    NO GROWTH 5 DAYS Performed at [facility], [address], [place], [place] [address]    Report Status 08/11/2020 FINAL  Final  Culture, blood (routine x 2)     Status: None   Collection Time: 08/06/20  6:52 PM   Specimen: Left Antecubital; Blood  Result Value Ref Range Status   Specimen Description LEFT ANTECUBITAL  Final   Special Requests   Final    BOTTLES DRAWN AEROBIC AND ANAEROBIC Blood Culture adequate volume   Culture   Final    NO GROWTH 5 DAYS Performed at [facility], [address], [place], [place] [address]    Report Status 08/11/2020 FINAL  Final  Resp Panel by RT-PCR (Flu A&B, Covid) Nasopharyngeal Swab     Status: None   Collection Time: 08/10/20  4:13 PM   Specimen: Nasopharyngeal Swab; Nasopharyngeal(NP) swabs in vial transport medium  Result Value Ref Range Status   SARS Coronavirus 2 by RT PCR NEGATIVE NEGATIVE Final    Comment: (NOTE) SARS-CoV-2 target nucleic acids are NOT DETECTED.  The SARS-CoV-2 RNA is generally detectable in upper respiratory specimens during the acute phase of infection. The lowest concentration of SARS-CoV-2 viral copies this assay can detect is 138 copies/mL. A negative result does not preclude SARS-Cov-2 infection and should not be used as the sole basis for treatment or other patient management decisions. A negative result may occur with  improper specimen collection/handling, submission of specimen other than nasopharyngeal swab, presence of viral mutation(s) within the areas targeted by this assay, and inadequate number of viral copies(<138 copies/mL). A negative result must be combined with clinical observations, patient history, and epidemiological information. The expected result is Negative.  Fact Sheet for Patients:  EntrepreneurPulse.com.au  Fact Sheet for Healthcare Providers:  IncredibleEmployment.be  This test is no t yet approved or cleared by the [place] FDA and  has been authorized for detection and/or  diagnosis of SARS-CoV-2 by FDA under an Emergency Use Authorization (EUA). This EUA will remain  in effect (meaning this test can be used) for the duration of the COVID-19 declaration under Section 564(b)(1) of the Act, 21 U.S.C.section 360bbb-3(b)(1), unless the authorization is terminated  or revoked sooner.       Influenza A by PCR NEGATIVE NEGATIVE Final   Influenza B by PCR NEGATIVE NEGATIVE Final    Comment: (NOTE) The Xpert Xpress SARS-CoV-2/FLU/RSV plus assay is intended as an aid in the diagnosis of influenza from Nasopharyngeal swab specimens and should not be used as a sole basis for treatment. Nasal washings and aspirates are unacceptable for Xpert Xpress SARS-CoV-2/FLU/RSV testing.  Fact Sheet for Patients: EntrepreneurPulse.com.au  Fact Sheet for Healthcare Providers: IncredibleEmployment.be  This test is not yet approved or cleared by the [place] FDA and has been authorized for detection and/or diagnosis of SARS-CoV-2 by FDA under an Emergency Use Authorization (EUA). This EUA will remain in effect (meaning this  test can be used) for the duration of the COVID-19 declaration under Section 564(b)(1) of the Act, 21 U.S.C. section 360bbb-3(b)(1), unless the authorization is terminated or revoked.  Performed at [facility], [address] [place], [place] [address]      Labs: BNP (last 3 results) Recent Labs    08/15/19 0944 08/30/19 2152 06/23/20 1248  BNP 148.2* 48.1 [id number]*   Basic Metabolic Panel: Recent Labs  Lab 08/08/20 1122 08/10/20 1253 08/11/20 0555  NA 135 131* 139  K 4.9 4.5 4.6  CL 104 100 106  CO2 21* 17* 22  GLUCOSE 151* 170* 147*  BUN 57* 66* 72*  CREATININE 4.47* 4.73* 4.90*  CALCIUM 8.4* 8.1* 8.3*   Liver Function Tests: Recent Labs  Lab 08/08/20 1122  AST 10*  ALT 9  ALKPHOS 118  BILITOT 0.4  PROT 7.8  ALBUMIN 3.2*   No results for input(s): LIPASE, AMYLASE in the last 168  hours. No results for input(s): AMMONIA in the last 168 hours. CBC: Recent Labs  Lab 08/08/20 1308 08/10/20 1253 08/11/20 0555  WBC 5.9 6.6 6.4  NEUTROABS 3.7 4.4  --   HGB 10.2* 10.4* 9.8*  HCT 32.9* 34.4* 31.0*  MCV 91.4 92.5 89.6  PLT 235 240 244   Cardiac Enzymes: No results for input(s): CKTOTAL, CKMB, CKMBINDEX, TROPONINI in the last 168 hours. BNP: Invalid input(s): POCBNP CBG: Recent Labs  Lab 08/10/20 1835 08/11/20 1137  GLUCAP 115* 105*   D-Dimer No results for input(s): DDIMER in the last 72 hours. Hgb A1c No results for input(s): HGBA1C in the last 72 hours. Lipid Profile No results for input(s): CHOL, HDL, LDLCALC, TRIG, CHOLHDL, LDLDIRECT in the last 72 hours. Thyroid function studies No results for input(s): TSH, T4TOTAL, T3FREE, THYROIDAB in the last 72 hours.  Invalid input(s): FREET3 Anemia work up No results for input(s): VITAMINB12, FOLATE, FERRITIN, TIBC, IRON, RETICCTPCT in the last 72 hours. Urinalysis    Component Value Date/Time   COLORURINE STRAW (A) 06/23/2020 1519   APPEARANCEUR CLEAR 06/23/2020 1519   LABSPEC 1.015 06/23/2020 1519   PHURINE 7.0 06/23/2020 1519   GLUCOSEU >=500 (A) 06/23/2020 1519   HGBUR SMALL (A) 06/23/2020 1519   BILIRUBINUR NEGATIVE 06/23/2020 1519   KETONESUR NEGATIVE 06/23/2020 1519   PROTEINUR >=300 (A) 06/23/2020 1519   NITRITE NEGATIVE 06/23/2020 1519   LEUKOCYTESUR NEGATIVE 06/23/2020 1519   Sepsis Labs Invalid input(s): PROCALCITONIN,  WBC,  LACTICIDVEN Microbiology Recent Results (from the past 240 hour(s))  Culture, blood (routine x 2)     Status: None   Collection Time: 08/06/20  6:11 PM   Specimen: BLOOD LEFT FOREARM  Result Value Ref Range Status   Specimen Description BLOOD LEFT FOREARM  Final   Special Requests   Final    BOTTLES DRAWN AEROBIC AND ANAEROBIC Blood Culture adequate volume   Culture   Final    NO GROWTH 5 DAYS Performed at [facility], [address] [place], [place]  [address]    Report Status 08/11/2020 FINAL  Final  Culture, blood (routine x 2)     Status: None   Collection Time: 08/06/20  6:52 PM   Specimen: Left Antecubital; Blood  Result Value Ref Range Status   Specimen Description LEFT ANTECUBITAL  Final   Special Requests   Final    BOTTLES DRAWN AEROBIC AND ANAEROBIC Blood Culture adequate volume   Culture   Final    NO GROWTH 5 DAYS Performed at [facility], [place]  [address] [place], [place] [address]    Report Status 08/11/2020 FINAL  Final  Resp Panel by RT-PCR (Flu A&B, Covid) Nasopharyngeal Swab     Status: None   Collection Time: 08/10/20  4:13 PM   Specimen: Nasopharyngeal Swab; Nasopharyngeal(NP) swabs in vial transport medium  Result Value Ref Range Status   SARS Coronavirus 2 by RT PCR NEGATIVE NEGATIVE Final    Comment: (NOTE) SARS-CoV-2 target nucleic acids are NOT DETECTED.  The SARS-CoV-2 RNA is generally detectable in upper respiratory specimens during the acute phase of infection. The lowest concentration of SARS-CoV-2 viral copies this assay can detect is 138 copies/mL. A negative result does not preclude SARS-Cov-2 infection and should not be used as the sole basis for treatment or other patient management decisions. A negative result may occur with  improper specimen collection/handling, submission of specimen other than nasopharyngeal swab, presence of viral mutation(s) within the areas targeted by this assay, and inadequate number of viral copies(<138 copies/mL). A negative result must be combined with clinical observations, patient history, and epidemiological information. The expected result is Negative.  Fact Sheet for Patients:  EntrepreneurPulse.com.au  Fact Sheet for Healthcare Providers:  IncredibleEmployment.be  This test is no t yet approved or cleared by the [place] FDA and  has been authorized for detection and/or diagnosis of SARS-CoV-2 by FDA under an  Emergency Use Authorization (EUA). This EUA will remain  in effect (meaning this test can be used) for the duration of the COVID-19 declaration under Section 564(b)(1) of the Act, 21 U.S.C.section 360bbb-3(b)(1), unless the authorization is terminated  or revoked sooner.       Influenza A by PCR NEGATIVE NEGATIVE Final   Influenza B by PCR NEGATIVE NEGATIVE Final    Comment: (NOTE) The Xpert Xpress SARS-CoV-2/FLU/RSV plus assay is intended as an aid in the diagnosis of influenza from Nasopharyngeal swab specimens and should not be used as a sole basis for treatment. Nasal washings and aspirates are unacceptable for Xpert Xpress SARS-CoV-2/FLU/RSV testing.  Fact Sheet for Patients: EntrepreneurPulse.com.au  Fact Sheet for Healthcare Providers: IncredibleEmployment.be  This test is not yet approved or cleared by the [place] FDA and has been authorized for detection and/or diagnosis of SARS-CoV-2 by FDA under an Emergency Use Authorization (EUA). This EUA will remain in effect (meaning this test can be used) for the duration of the COVID-19 declaration under Section 564(b)(1) of the Act, 21 U.S.C. section 360bbb-3(b)(1), unless the authorization is terminated or revoked.  Performed at [facility], [address], [place], [place] [address]      Time coordinating discharge: 59mns  SIGNED:   JKathie Dike MD  [facility] 08/13/2020, 8:09 PM   If 7PM-7AM, please contact night-coverage [email or web address]

## 2020-08-14 ENCOUNTER — Encounter (HOSPITAL_COMMUNITY): Payer: Self-pay

## 2020-08-14 ENCOUNTER — Emergency Department (HOSPITAL_COMMUNITY): Payer: Medicaid Other

## 2020-08-14 ENCOUNTER — Other Ambulatory Visit: Payer: Self-pay

## 2020-08-14 ENCOUNTER — Emergency Department (HOSPITAL_COMMUNITY)
Admission: EM | Admit: 2020-08-14 | Discharge: 2020-08-14 | Disposition: A | Discharge status: Home / Self Care | Payer: Medicaid Other | Attending: Emergency Medicine | Admitting: Emergency Medicine

## 2020-08-14 DIAGNOSIS — E1140 Type 2 diabetes mellitus with diabetic neuropathy, unspecified: Secondary | ICD-10-CM | POA: Insufficient documentation

## 2020-08-14 DIAGNOSIS — Z794 Long term (current) use of insulin: Secondary | ICD-10-CM | POA: Diagnosis not present

## 2020-08-14 DIAGNOSIS — R000 Tachycardia, unspecified: Secondary | ICD-10-CM | POA: Insufficient documentation

## 2020-08-14 DIAGNOSIS — M79602 Pain in left arm: Secondary | ICD-10-CM

## 2020-08-14 DIAGNOSIS — I5042 Chronic combined systolic (congestive) and diastolic (congestive) heart failure: Secondary | ICD-10-CM | POA: Insufficient documentation

## 2020-08-14 DIAGNOSIS — I132 Hypertensive heart and chronic kidney disease with heart failure and with stage 5 chronic kidney disease, or end stage renal disease: Secondary | ICD-10-CM | POA: Insufficient documentation

## 2020-08-14 DIAGNOSIS — Z992 Dependence on renal dialysis: Secondary | ICD-10-CM | POA: Diagnosis not present

## 2020-08-14 DIAGNOSIS — E1122 Type 2 diabetes mellitus with diabetic chronic kidney disease: Secondary | ICD-10-CM | POA: Insufficient documentation

## 2020-08-14 DIAGNOSIS — F17210 Nicotine dependence, cigarettes, uncomplicated: Secondary | ICD-10-CM | POA: Insufficient documentation

## 2020-08-14 DIAGNOSIS — Z79899 Other long term (current) drug therapy: Secondary | ICD-10-CM | POA: Diagnosis not present

## 2020-08-14 DIAGNOSIS — Z7982 Long term (current) use of aspirin: Secondary | ICD-10-CM | POA: Diagnosis not present

## 2020-08-14 DIAGNOSIS — N186 End stage renal disease: Secondary | ICD-10-CM | POA: Diagnosis not present

## 2020-08-14 MED ORDER — OXYCODONE-ACETAMINOPHEN 5-325 MG PO TABS
2.0000 | ORAL_TABLET | Freq: Once | ORAL | Status: AC
  Administered 2020-08-14: 2 via ORAL
  Filled 2020-08-14: qty 2
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
## 2020-08-14 NOTE — ED Notes (Signed)
Dr Melina Copa at bedside prior to triage complete for MSE.

## 2020-08-14 NOTE — ED Triage Notes (Signed)
Pt presents to ED with complaints of left arm pain x couple days from elbow to wrist.

## 2020-08-14 NOTE — ED Notes (Signed)
Pt left unit prior to receiving discharge instructions and arm sling.

## 2020-08-14 NOTE — Discharge Instructions (Addendum)
You were seen in the emergency department for evaluation of left arm pain.  You had x-rays of your elbow forearm wrist and hand that did not show any obvious fractures.  Please use ice to the affected areas and Tylenol as needed for pain.  Follow-up with your doctor.

## 2020-08-15 ENCOUNTER — Emergency Department (HOSPITAL_COMMUNITY)
Admission: EM | Admit: 2020-08-15 | Discharge: 2020-08-15 | Disposition: A | Discharge status: Home / Self Care | Payer: Medicaid Other

## 2020-08-15 ENCOUNTER — Other Ambulatory Visit: Payer: Self-pay

## 2020-08-15 ENCOUNTER — Encounter: Payer: Medicaid Other | Admitting: Vascular Surgery

## 2020-08-15 ENCOUNTER — Emergency Department (HOSPITAL_COMMUNITY)
Admission: EM | Admit: 2020-08-15 | Discharge: 2020-08-15 | Discharge status: Against Medical Advice | Payer: Medicaid Other

## 2020-08-15 NOTE — ED Notes (Signed)
Pt was called multiple times for triage. Reception staff informed this RN that pt left around [place]. As of this note, pt has not returned. Therefore, I am unable to complete triage.

## 2020-08-16 ENCOUNTER — Emergency Department (HOSPITAL_COMMUNITY): Payer: Medicaid Other

## 2020-08-16 ENCOUNTER — Emergency Department (HOSPITAL_COMMUNITY)
Admission: EM | Admit: 2020-08-16 | Discharge: 2020-08-17 | Disposition: A | Discharge status: Home / Self Care | Payer: Medicaid Other | Source: Home / Self Care | Attending: Emergency Medicine | Admitting: Emergency Medicine

## 2020-08-16 ENCOUNTER — Telehealth: Payer: Self-pay | Admitting: Internal Medicine

## 2020-08-16 ENCOUNTER — Encounter (HOSPITAL_COMMUNITY): Payer: Self-pay | Admitting: Emergency Medicine

## 2020-08-16 ENCOUNTER — Other Ambulatory Visit: Payer: Self-pay

## 2020-08-16 ENCOUNTER — Emergency Department (HOSPITAL_COMMUNITY)
Admission: EM | Admit: 2020-08-16 | Discharge: 2020-08-16 | Disposition: A | Discharge status: Home / Self Care | Payer: Medicaid Other | Attending: Emergency Medicine | Admitting: Emergency Medicine

## 2020-08-16 ENCOUNTER — Encounter (HOSPITAL_COMMUNITY): Payer: Medicaid Other

## 2020-08-16 DIAGNOSIS — Z7982 Long term (current) use of aspirin: Secondary | ICD-10-CM | POA: Insufficient documentation

## 2020-08-16 DIAGNOSIS — E1140 Type 2 diabetes mellitus with diabetic neuropathy, unspecified: Secondary | ICD-10-CM | POA: Insufficient documentation

## 2020-08-16 DIAGNOSIS — E11319 Type 2 diabetes mellitus with unspecified diabetic retinopathy without macular edema: Secondary | ICD-10-CM | POA: Insufficient documentation

## 2020-08-16 DIAGNOSIS — F17210 Nicotine dependence, cigarettes, uncomplicated: Secondary | ICD-10-CM | POA: Insufficient documentation

## 2020-08-16 DIAGNOSIS — I5042 Chronic combined systolic (congestive) and diastolic (congestive) heart failure: Secondary | ICD-10-CM | POA: Diagnosis not present

## 2020-08-16 DIAGNOSIS — N186 End stage renal disease: Secondary | ICD-10-CM

## 2020-08-16 DIAGNOSIS — Z794 Long term (current) use of insulin: Secondary | ICD-10-CM | POA: Insufficient documentation

## 2020-08-16 DIAGNOSIS — R109 Unspecified abdominal pain: Secondary | ICD-10-CM | POA: Insufficient documentation

## 2020-08-16 DIAGNOSIS — I5040 Unspecified combined systolic (congestive) and diastolic (congestive) heart failure: Secondary | ICD-10-CM | POA: Insufficient documentation

## 2020-08-16 DIAGNOSIS — Z992 Dependence on renal dialysis: Secondary | ICD-10-CM | POA: Diagnosis not present

## 2020-08-16 DIAGNOSIS — M79642 Pain in left hand: Secondary | ICD-10-CM

## 2020-08-16 DIAGNOSIS — N185 Chronic kidney disease, stage 5: Secondary | ICD-10-CM | POA: Insufficient documentation

## 2020-08-16 DIAGNOSIS — I132 Hypertensive heart and chronic kidney disease with heart failure and with stage 5 chronic kidney disease, or end stage renal disease: Secondary | ICD-10-CM | POA: Insufficient documentation

## 2020-08-16 DIAGNOSIS — R112 Nausea with vomiting, unspecified: Secondary | ICD-10-CM

## 2020-08-16 DIAGNOSIS — Z79899 Other long term (current) drug therapy: Secondary | ICD-10-CM | POA: Insufficient documentation

## 2020-08-16 DIAGNOSIS — E1122 Type 2 diabetes mellitus with diabetic chronic kidney disease: Secondary | ICD-10-CM | POA: Insufficient documentation

## 2020-08-16 LAB — LIPASE, BLOOD: Lipase: 47 U/L (ref 11–51)

## 2020-08-16 LAB — COMPREHENSIVE METABOLIC PANEL
ALT: 10 U/L (ref 0–44)
AST: 10 U/L — ABNORMAL LOW (ref 15–41)
Albumin: 3.2 g/dL — ABNORMAL LOW (ref 3.5–5.0)
Alkaline Phosphatase: 115 U/L (ref 38–126)
Anion gap: 10 (ref 5–15)
BUN: 63 mg/dL — ABNORMAL HIGH (ref 6–20)
CO2: 24 mmol/L (ref 22–32)
Calcium: 8 mg/dL — ABNORMAL LOW (ref 8.9–10.3)
Chloride: 101 mmol/L (ref 98–111)
Creatinine, Ser: 5.1 mg/dL — ABNORMAL HIGH (ref 0.61–1.24)
GFR, Estimated: 12 mL/min — ABNORMAL LOW (ref >60)
Glucose, Bld: 199 mg/dL — ABNORMAL HIGH (ref 70–99)
Potassium: 4.3 mmol/L (ref 3.5–5.1)
Sodium: 135 mmol/L (ref 135–145)
Total Bilirubin: 0.5 mg/dL (ref 0.3–1.2)
Total Protein: 7.5 g/dL (ref 6.5–8.1)

## 2020-08-16 LAB — PHENYTOIN LEVEL, TOTAL: Phenytoin Lvl: 3 ug/mL — ABNORMAL LOW (ref 10.0–20.0)

## 2020-08-16 LAB — CBC
HCT: 32 % — ABNORMAL LOW (ref 39.0–52.0)
Hemoglobin: 10.2 g/dL — ABNORMAL LOW (ref 13.0–17.0)
MCH: 28.8 pg (ref 26.0–34.0)
MCHC: 31.9 g/dL (ref 30.0–36.0)
MCV: 90.4 fL (ref 80.0–100.0)
Platelets: 241 10*3/uL (ref 150–400)
RBC: 3.54 MIL/uL — ABNORMAL LOW (ref 4.22–5.81)
RDW: 17.9 % — ABNORMAL HIGH (ref 11.5–15.5)
WBC: 7.7 10*3/uL (ref 4.0–10.5)
nRBC: 0 % (ref 0.0–0.2)

## 2020-08-16 MED ORDER — OXYCODONE-ACETAMINOPHEN 5-325 MG PO TABS
1.0000 | ORAL_TABLET | Freq: Once | ORAL | Status: AC
  Administered 2020-08-16: 1 via ORAL
  Filled 2020-08-16: qty 1

## 2020-08-16 MED ORDER — HYDROMORPHONE HCL 1 MG/ML IJ SOLN
0.5000 mg | Freq: Once | INTRAMUSCULAR | Status: DC
  Filled 2020-08-16: qty 1

## 2020-08-16 MED ORDER — OXYCODONE-ACETAMINOPHEN 5-325 MG PO TABS: 1.0000 | ORAL_TABLET | Freq: Four times a day (QID) | ORAL | 0 refills | Status: DC | PRN

## 2020-08-16 MED ORDER — OXYCODONE-ACETAMINOPHEN 5-325 MG PO TABS: 1.0000 | ORAL_TABLET | Freq: Four times a day (QID) | ORAL | 0 refills | Status: AC | PRN

## 2020-08-16 NOTE — Telephone Encounter (Signed)
Noted. FYI to Vicente Males

## 2020-08-16 NOTE — Discharge Instructions (Addendum)
It was our pleasure to provide your ER care today - we hope that you feel better.  Rest. Drink adequate fluids. Take zofran as need if nauseated.   If constipation, get adequate fiber in diet, take colace (stool softener) 2x/day, and miralax (laxative) once a day as need.   As relates dialysis and your catheter, contact your dialysis center/kidney doctor first thing in AM to discuss plan.   Follow up with your vascular surgeon as recently discussed/planned with them.   Return to ER right away if worse, new symptoms, fevers, new or severe pain, severe abdominal pain, persistent vomiting, trouble breathing, or other concern.
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
## 2020-08-16 NOTE — ED Notes (Signed)
Patient refusing to keep telemetry leads on.

## 2020-08-16 NOTE — Telephone Encounter (Signed)
[facility] CALLED AND SAID THIS PATIENT IS IN THE ER AND THEY ARE CANCELLING THE N[facility] MED TEST FOR TODAY

## 2020-08-16 NOTE — Discharge Instructions (Addendum)
Follow up with dr. Amedeo Kinsman this week or next

## 2020-08-16 NOTE — ED Triage Notes (Signed)
Pt reports 6 episodes of emesis and reports increased swelling; pt further reports he went for dialysis today but his catheter was clogged; they suggested he come to the ER

## 2020-08-16 NOTE — ED Notes (Signed)
Pt in room shaking stating "I have cramps and this helps me get rid of them".
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
## 2020-08-16 NOTE — ED Triage Notes (Signed)
Pt c/o left hand/wrist pain after falling out of wheelchair Friday.

## 2020-08-17 NOTE — ED Notes (Signed)
This RN went to discharge patient. Pt stated that they do not make him sign on signature pad and he gets a piece of paper. This RN pointed to discharge instructions that had been given to patient and asked "are you talking about these papers?" Pt then began to yell at this RN "why you gotta have such a fucking attitude?" This RN explained that I was just trying to clarify and that speaking to staff with that language will not be tolerated, then asked patient to sign for discharge. Pt then continued to yell at this RN saying "fuck these papers, you've been nothing but a fucking bitch since I got here, making me wait for pain medicine. I aint signing shit." This RN lowered side rail for patient to get out of bed and left room.

## 2020-08-19 NOTE — Progress Notes (Signed)
Kindly inform the patient that EEG study was normal

## 2020-08-20 ENCOUNTER — Emergency Department (HOSPITAL_COMMUNITY): Payer: Medicaid Other

## 2020-08-20 ENCOUNTER — Observation Stay (HOSPITAL_COMMUNITY)
Admission: EM | Admit: 2020-08-20 | Discharge: 2020-08-21 | Disposition: A | Discharge status: Home / Self Care | Payer: Medicaid Other | Attending: Internal Medicine | Admitting: Internal Medicine

## 2020-08-20 ENCOUNTER — Emergency Department (HOSPITAL_COMMUNITY)
Admission: EM | Admit: 2020-08-20 | Discharge: 2020-08-20 | Disposition: A | Discharge status: Home / Self Care | Payer: Medicaid Other | Source: Home / Self Care | Attending: Emergency Medicine | Admitting: Emergency Medicine

## 2020-08-20 ENCOUNTER — Encounter (HOSPITAL_COMMUNITY): Payer: Self-pay | Admitting: Emergency Medicine

## 2020-08-20 ENCOUNTER — Other Ambulatory Visit: Payer: Self-pay

## 2020-08-20 DIAGNOSIS — I2510 Atherosclerotic heart disease of native coronary artery without angina pectoris: Secondary | ICD-10-CM | POA: Insufficient documentation

## 2020-08-20 DIAGNOSIS — D631 Anemia in chronic kidney disease: Secondary | ICD-10-CM | POA: Insufficient documentation

## 2020-08-20 DIAGNOSIS — E1165 Type 2 diabetes mellitus with hyperglycemia: Secondary | ICD-10-CM | POA: Insufficient documentation

## 2020-08-20 DIAGNOSIS — I5042 Chronic combined systolic (congestive) and diastolic (congestive) heart failure: Secondary | ICD-10-CM | POA: Insufficient documentation

## 2020-08-20 DIAGNOSIS — Z794 Long term (current) use of insulin: Secondary | ICD-10-CM | POA: Insufficient documentation

## 2020-08-20 DIAGNOSIS — K625 Hemorrhage of anus and rectum: Secondary | ICD-10-CM | POA: Diagnosis present

## 2020-08-20 DIAGNOSIS — I132 Hypertensive heart and chronic kidney disease with heart failure and with stage 5 chronic kidney disease, or end stage renal disease: Secondary | ICD-10-CM | POA: Insufficient documentation

## 2020-08-20 DIAGNOSIS — R1084 Generalized abdominal pain: Secondary | ICD-10-CM | POA: Diagnosis not present

## 2020-08-20 DIAGNOSIS — Z79899 Other long term (current) drug therapy: Secondary | ICD-10-CM | POA: Insufficient documentation

## 2020-08-20 DIAGNOSIS — F17210 Nicotine dependence, cigarettes, uncomplicated: Secondary | ICD-10-CM | POA: Insufficient documentation

## 2020-08-20 DIAGNOSIS — Z7982 Long term (current) use of aspirin: Secondary | ICD-10-CM | POA: Insufficient documentation

## 2020-08-20 DIAGNOSIS — E1122 Type 2 diabetes mellitus with diabetic chronic kidney disease: Secondary | ICD-10-CM | POA: Insufficient documentation

## 2020-08-20 DIAGNOSIS — Z7984 Long term (current) use of oral hypoglycemic drugs: Secondary | ICD-10-CM | POA: Insufficient documentation

## 2020-08-20 DIAGNOSIS — R1111 Vomiting without nausea: Secondary | ICD-10-CM

## 2020-08-20 DIAGNOSIS — Z20822 Contact with and (suspected) exposure to covid-19: Secondary | ICD-10-CM | POA: Diagnosis not present

## 2020-08-20 DIAGNOSIS — K922 Gastrointestinal hemorrhage, unspecified: Principal | ICD-10-CM | POA: Insufficient documentation

## 2020-08-20 DIAGNOSIS — N186 End stage renal disease: Secondary | ICD-10-CM | POA: Insufficient documentation

## 2020-08-20 DIAGNOSIS — R197 Diarrhea, unspecified: Secondary | ICD-10-CM

## 2020-08-20 DIAGNOSIS — R1110 Vomiting, unspecified: Secondary | ICD-10-CM | POA: Diagnosis present

## 2020-08-20 DIAGNOSIS — I10 Essential (primary) hypertension: Secondary | ICD-10-CM

## 2020-08-20 DIAGNOSIS — R109 Unspecified abdominal pain: Secondary | ICD-10-CM | POA: Diagnosis present

## 2020-08-20 DIAGNOSIS — E785 Hyperlipidemia, unspecified: Secondary | ICD-10-CM | POA: Diagnosis present

## 2020-08-20 DIAGNOSIS — Z992 Dependence on renal dialysis: Secondary | ICD-10-CM | POA: Insufficient documentation

## 2020-08-20 DIAGNOSIS — E113399 Type 2 diabetes mellitus with moderate nonproliferative diabetic retinopathy without macular edema, unspecified eye: Secondary | ICD-10-CM | POA: Insufficient documentation

## 2020-08-20 DIAGNOSIS — E782 Mixed hyperlipidemia: Secondary | ICD-10-CM

## 2020-08-20 DIAGNOSIS — E1140 Type 2 diabetes mellitus with diabetic neuropathy, unspecified: Secondary | ICD-10-CM | POA: Insufficient documentation

## 2020-08-20 LAB — CBC WITH DIFFERENTIAL/PLATELET
Abs Immature Granulocytes: 0.04 10*3/uL (ref 0.00–0.07)
Basophils Absolute: 0 10*3/uL (ref 0.0–0.1)
Basophils Relative: 0 %
Eosinophils Absolute: 0.1 10*3/uL (ref 0.0–0.5)
Eosinophils Relative: 1 %
HCT: 31.9 % — ABNORMAL LOW (ref 39.0–52.0)
Hemoglobin: 10.2 g/dL — ABNORMAL LOW (ref 13.0–17.0)
Immature Granulocytes: 0 %
Lymphocytes Relative: 17 %
Lymphs Abs: 1.7 10*3/uL (ref 0.7–4.0)
MCH: 29.5 pg (ref 26.0–34.0)
MCHC: 32 g/dL (ref 30.0–36.0)
MCV: 92.2 fL (ref 80.0–100.0)
Monocytes Absolute: 0.7 10*3/uL (ref 0.1–1.0)
Monocytes Relative: 7 %
Neutro Abs: 7.3 10*3/uL (ref 1.7–7.7)
Neutrophils Relative %: 75 %
Platelets: 262 10*3/uL (ref 150–400)
RBC: 3.46 MIL/uL — ABNORMAL LOW (ref 4.22–5.81)
RDW: 18.3 % — ABNORMAL HIGH (ref 11.5–15.5)
WBC: 9.9 10*3/uL (ref 4.0–10.5)
nRBC: 0 % (ref 0.0–0.2)

## 2020-08-20 LAB — COMPREHENSIVE METABOLIC PANEL
ALT: 11 U/L (ref 0–44)
AST: 12 U/L — ABNORMAL LOW (ref 15–41)
Albumin: 3.3 g/dL — ABNORMAL LOW (ref 3.5–5.0)
Alkaline Phosphatase: 117 U/L (ref 38–126)
Anion gap: 10 (ref 5–15)
BUN: 48 mg/dL — ABNORMAL HIGH (ref 6–20)
CO2: 25 mmol/L (ref 22–32)
Calcium: 8.6 mg/dL — ABNORMAL LOW (ref 8.9–10.3)
Chloride: 102 mmol/L (ref 98–111)
Creatinine, Ser: 3.9 mg/dL — ABNORMAL HIGH (ref 0.61–1.24)
GFR, Estimated: 17 mL/min — ABNORMAL LOW (ref >60)
Glucose, Bld: 93 mg/dL (ref 70–99)
Potassium: 4.2 mmol/L (ref 3.5–5.1)
Sodium: 137 mmol/L (ref 135–145)
Total Bilirubin: 0.2 mg/dL — ABNORMAL LOW (ref 0.3–1.2)
Total Protein: 7.8 g/dL (ref 6.5–8.1)

## 2020-08-20 LAB — BASIC METABOLIC PANEL
Anion gap: 11 (ref 5–15)
BUN: 50 mg/dL — ABNORMAL HIGH (ref 6–20)
CO2: 21 mmol/L — ABNORMAL LOW (ref 22–32)
Calcium: 8.7 mg/dL — ABNORMAL LOW (ref 8.9–10.3)
Chloride: 106 mmol/L (ref 98–111)
Creatinine, Ser: 4.21 mg/dL — ABNORMAL HIGH (ref 0.61–1.24)
GFR, Estimated: 16 mL/min — ABNORMAL LOW (ref >60)
Glucose, Bld: 157 mg/dL — ABNORMAL HIGH (ref 70–99)
Potassium: 4.6 mmol/L (ref 3.5–5.1)
Sodium: 138 mmol/L (ref 135–145)

## 2020-08-20 LAB — POC OCCULT BLOOD, ED: Fecal Occult Bld: POSITIVE — AB

## 2020-08-20 LAB — CBC
HCT: 32.8 % — ABNORMAL LOW (ref 39.0–52.0)
Hemoglobin: 10.1 g/dL — ABNORMAL LOW (ref 13.0–17.0)
MCH: 28.9 pg (ref 26.0–34.0)
MCHC: 30.8 g/dL (ref 30.0–36.0)
MCV: 93.7 fL (ref 80.0–100.0)
Platelets: 249 10*3/uL (ref 150–400)
RBC: 3.5 MIL/uL — ABNORMAL LOW (ref 4.22–5.81)
RDW: 18.3 % — ABNORMAL HIGH (ref 11.5–15.5)
WBC: 9.8 10*3/uL (ref 4.0–10.5)
nRBC: 0 % (ref 0.0–0.2)

## 2020-08-20 LAB — RESP PANEL BY RT-PCR (FLU A&B, COVID) ARPGX2
Influenza A by PCR: NEGATIVE
Influenza B by PCR: NEGATIVE
SARS Coronavirus 2 by RT PCR: NEGATIVE

## 2020-08-20 LAB — TYPE AND SCREEN
ABO/RH(D): B POS
Antibody Screen: NEGATIVE

## 2020-08-20 LAB — LIPASE, BLOOD: Lipase: 45 U/L (ref 11–51)

## 2020-08-20 MED ORDER — OXYCODONE-ACETAMINOPHEN 5-325 MG PO TABS
1.0000 | ORAL_TABLET | Freq: Once | ORAL | Status: AC
Start: 2020-08-20 — End: 2020-08-20
  Administered 2020-08-20: 1 via ORAL
  Filled 2020-08-20: qty 1

## 2020-08-20 MED ORDER — IOHEXOL 300 MG/ML  SOLN: 100.0000 mL | Freq: Once | INTRAMUSCULAR | Status: DC | PRN

## 2020-08-20 MED ORDER — OXYCODONE-ACETAMINOPHEN 5-325 MG PO TABS
2.0000 | ORAL_TABLET | Freq: Once | ORAL | Status: AC
Start: 2020-08-20 — End: 2020-08-20
  Administered 2020-08-20: 2 via ORAL
  Filled 2020-08-20: qty 2

## 2020-08-20 NOTE — ED Notes (Signed)
Pt is very inappropriate, calling the lab tech skinny.  And says to me, with your horse hair.  Pt is refusing and IV.

## 2020-08-20 NOTE — H&P (Signed)
History and Physical  Zachary Young [id number] DOB: 28-Jun-1960 DOA: 08/20/2020  Referring physician: Epimenio Foot, MD PCP: Mellissa Kohut, DO  Patient coming from: Home  Chief Complaint:   HPI: Zachary Young is a 60 y.o. male with medical history significant for ESRD(MWF),diabetes mellitus type 2, hyperlipidemia, coronary artery disease, osteomyelitis status post right BKA, chronic volume overload who presents to the emergency department complaining of abdominal pain which started last night (5/6), this was followed with some watery diarrhea.  Initially, he only noticed some blood on the tissue paper, this was subsequently followed with 4 episodes of watery or bloody diarrhea.  Abdominal pain was sharp in nature, it was intermittent and rated as 10/10 on pain scale.  He endorsed 4 episodes of vomiting today (he was not sure if it was bloody or not).  Patient presented to the ED this morning, but he was upset about blood draw and left AMA.  He returned this afternoon due to ongoing bloody watery stool and the abdominal pain.  He denies chest pain, shortness of breath, fever, chills, any sick contacts  ED Course:  In the emergency department, hemodynamically stable, BP was 161/76.  Work-up in the ED showed normocytic anemia, BUN to creatinine 50/4.21, CBG 157.  Lipase 45, FOBT was positive.  CT abdomen and pelvis without contrast showed no acute active process within the abdomen or pelvis He was treated with Percocet.  Gastroenterology (Dr. Jenetta Downer) was consulted and recommended admitting patient with plan to see patient in the morning.  Review of Systems: Constitutional: Negative for chills and fever.  HENT: Negative for ear pain and sore throat.   Eyes: Negative for pain and visual disturbance.  Respiratory: Negative for cough, chest tightness and shortness of breath.   Cardiovascular: Negative for chest pain and palpitations.  Gastrointestinal: Positive for abdominal pain, blood in  stool and vomiting.  Endocrine: Negative for polyphagia and polyuria.  Genitourinary: Negative for decreased urine volume, dysuria, enuresis, hematuria Musculoskeletal: Negative for arthralgias and back pain.  Skin: Negative for color change and rash.  Allergic/Immunologic: Negative for immunocompromised state.  Neurological: Negative for tremors, syncope, speech difficulty and headaches.  Hematological: Does not bruise/bleed easily.  All other systems reviewed and are negative   Past Medical History:  Diagnosis Date  . CHF (congestive heart failure) ([place])   . Diabetes mellitus without complication ([place])   . Dialysis patient ([place])   . Peripheral edema   . Renal disorder    kidney disease   Past Surgical History:  Procedure Laterality Date  . AMPUTATION Right 10/21/2019   Procedure: RIGHT BELOW KNEE AMPUTATION;  Surgeon: Newt Minion, MD;  Location: [facility];  Service: Orthopedics;  Laterality: Right;  . AV FISTULA PLACEMENT Left 11/13/2019   Procedure: LEFT ARTERIOVENOUS (AV) FISTULA VERSUS ARTERIOVENOUS GRAFT;  Surgeon: Rosetta Posner, MD;  Location: [facility];  Service: Vascular;  Laterality: Left;  . AV FISTULA PLACEMENT Left 04/05/2020   Procedure: INSERTION OF LEFT ARM ARTERIOVENOUS (AV) GORE-TEX GRAFT;  Surgeon: Rosetta Posner, MD;  Location: [facility] OR;  Service: Vascular;  Laterality: Left;  . AV FISTULA PLACEMENT Right 07/14/2020   Procedure: RIGHT ARM ARTERIOVENOUS (AV) GRAFT CREATION;  Surgeon: Rosetta Posner, MD;  Location: [facility];  Service: Vascular;  Laterality: Right;  . BASCILIC VEIN TRANSPOSITION Left 01/27/2020   Procedure: LEFT ARM 2ND STAGE BASCILIC VEIN TRANSPOSITION;  Surgeon: Rosetta Posner, MD;  Location: [facility];  Service: Vascular;  Laterality: Left;  . DIALYSIS/PERMA CATHETER INSERTION N/A  03/03/2020   Procedure: DIALYSIS/PERMA CATHETER INSERTION;  Surgeon: Algernon Huxley, MD;  Location: [facility];  Service: Cardiovascular;  Laterality: N/A;  .  ESOPHAGOGASTRODUODENOSCOPY (EGD) WITH PROPOFOL N/A 06/17/2020   gastritis, normal duodenum.   . INSERTION OF DIALYSIS CATHETER N/A 04/05/2020   Procedure: INSERTION OF TUNNEL  DIALYSIS CATHETER LEFT INTERNAL JUGULAR;  Surgeon: Rosetta Posner, MD;  Location: [place] [facility];  Service: Vascular;  Laterality: N/A;  . IR FLUORO GUIDE CV LINE RIGHT  11/05/2019  . IR FLUORO GUIDE CV LINE RIGHT  06/07/2020  . IR REMOVAL TUN CV CATH W/O FL  06/01/2020  . IR THROMBECTOMY AV FISTULA W/THROMBOLYSIS/PTA INC/SHUNT/IMG LEFT Left 06/06/2020  . IR US GUIDE VASC ACCESS LEFT  06/06/2020  . IR US GUIDE VASC ACCESS RIGHT  11/05/2019  . IR US GUIDE VASC ACCESS RIGHT  06/07/2020  . REMOVAL OF A DIALYSIS CATHETER Right 04/05/2020   Procedure: REMOVAL OF RIGHT CHEST DIALYSIS CATHETER;  Surgeon: Rosetta Posner, MD;  Location: [facility];  Service: Vascular;  Laterality: Right;  . TOE AMPUTATION Bilateral    due to osteomyelitis, all 5 each foot    Social History:  reports that he has been smoking cigarettes. He has been smoking about 0.50 packs per day. He has never used smokeless tobacco. He reports previous alcohol use. He reports previous drug use. Drug: Cocaine.   Allergies  Allergen Reactions  . Bee Venom Anaphylaxis    Per pt, nearly died from bee sting as a child  . Propofol Other (See Comments)     Hallucinations  . Eggs Or Egg-Derived Products Nausea And Vomiting and Other (See Comments)    Stomach cramps in large amounts  . Morphine Nausea And Vomiting  . Oxycodone Nausea And Vomiting    Family History  Problem Relation Age of Onset  . Cancer Mother        uknown type of cancer  . Colon cancer Neg Hx   . Colon polyps Neg Hx      Prior to Admission medications   Medication Sig Start Date End Date Taking? Authorizing Provider  amLODipine (NORVASC) 5 MG tablet Take 1 tablet (5 mg total) by mouth daily. 06/28/20   Orson Eva, MD  aspirin EC 81 MG EC tablet Take 1 tablet (81 mg total) by mouth daily. Patient taking  differently: Take 81 mg by mouth 2 (two) times daily. 08/21/19   Gladys Damme, MD  atorvastatin (LIPITOR) 20 MG tablet Take 1 tablet (20 mg total) by mouth daily with supper. 06/28/20   Orson Eva, MD  dicyclomine (BENTYL) 20 MG tablet Take 1 tablet (20 mg total) by mouth 2 (two) times daily. 07/22/20   Margarita Mail, PA-C  ergocalciferol (VITAMIN D2) 1.25 MG (50000 UT) capsule Take 1 capsule (50,000 Units total) by mouth every Friday. 12/11/19   Samella Parr, NP  insulin glargine (LANTUS) 100 UNIT/ML injection Inject 0.35 mLs (35 Units total) into the skin at bedtime. Patient taking differently: Inject 25 Units into the skin at bedtime. 12/11/19   Samella Parr, NP  NOVOLOG FLEXPEN 100 UNIT/ML FlexPen Inject 4 Units into the skin 3 (three) times daily with meals. 05/24/20   [provider]  oxyCODONE-acetaminophen (PERCOCET) 5-325 MG tablet Take 1 tablet by mouth every 6 (six) hours as needed. 08/16/20   Milton Ferguson, MD  pantoprazole (PROTONIX) 40 MG tablet Take 1 tablet (40 mg total) by mouth 2 (two) times daily before a meal. 08/02/20  Annitta Needs, NP  phenytoin (DILANTIN) 100 MG ER capsule Take 3 capsules (300 mg total) by mouth at bedtime. 06/15/20   Garvin Fila, MD  sucralfate (CARAFATE) 1 g tablet Take 1 tablet (1 g total) by mouth 4 (four) times daily -  with meals and at bedtime. 07/22/20   Margarita Mail, PA-C  torsemide (DEMADEX) 100 MG tablet Take 1 tablet (100 mg total) by mouth daily. On Monday-Wednesday-Friday-Sunday (non-dialysis days) Patient taking differently: Take 100 mg by mouth 4 (four) times a week. On Monday-Wednesday-Friday-Sunday (non-dialysis days) 06/28/20   Orson Eva, MD    Physical Exam: BP (!) 146/88   Pulse 88   Temp 98.2 F (36.8 C)   Resp 20   Ht '6\' 3"'$  (1.905 m)   Wt (!) 163.3 kg   SpO2 95%   BMI 45.00 kg/m   . General: 60 y.o. year-old male obese, but in no acute distress.  Alert and oriented x3. Marland Kitchen HEENT: NCAT, EOMI . Neck: Supple,  trachea medial . Cardiovascular: Regular rate and rhythm with no rubs or gallops.  No thyromegaly or JVD noted.  No lower extremity edema. 2/4 pulses in all 4 extremities. Marland Kitchen Respiratory: Clear to auscultation with no wheezes or rales. Good inspiratory effort. . Abdomen: Soft, tender to palpation of the abdomen. Normal bowel sounds x4 quadrants. . Muskuloskeletal: Right BKA, left transmetatarsal amputation, swelling of right forearm.  Neuro: CN II-XII intact, strength 5 x 4/4, sensation, reflexes . Skin: No ulcerative lesions noted or rashes . Psychiatry: Mood is appropriate for condition and setting          Labs on Admission:  Basic Metabolic Panel: Recent Labs  Lab 08/16/20 2205 08/20/20 0828 08/20/20 2146  NA 135 137 138  K 4.3 4.2 4.6  CL 101 102 106  CO2 24 25 21*  GLUCOSE 199* 93 157*  BUN 63* 48* 50*  CREATININE 5.10* 3.90* 4.21*  CALCIUM 8.0* 8.6* 8.7*   Liver Function Tests: Recent Labs  Lab 08/16/20 2205 08/20/20 0828  AST 10* 12*  ALT 10 11  ALKPHOS 115 117  BILITOT 0.5 0.2*  PROT 7.5 7.8  ALBUMIN 3.2* 3.3*   Recent Labs  Lab 08/16/20 2205 08/20/20 0828  LIPASE 47 45   No results for input(s): AMMONIA in the last 168 hours. CBC: Recent Labs  Lab 08/16/20 2205 08/20/20 0828 08/20/20 2146  WBC 7.7 9.9 9.8  NEUTROABS  --  7.3  --   HGB 10.2* 10.2* 10.1*  HCT 32.0* 31.9* 32.8*  MCV 90.4 92.2 93.7  PLT 241 262 249   Cardiac Enzymes: No results for input(s): CKTOTAL, CKMB, CKMBINDEX, TROPONINI in the last 168 hours.  BNP (last 3 results) Recent Labs    08/30/19 2152 06/23/20 1248  BNP 48.1 385.0*    ProBNP (last 3 results) No results for input(s): PROBNP in the last 8760 hours.  CBG: No results for input(s): GLUCAP in the last 168 hours.  Radiological Exams on Admission: CT ABDOMEN PELVIS WO CONTRAST  Result Date: 08/20/2020 CLINICAL DATA:  Bloody stool and lower abdominal pain. EXAM: CT ABDOMEN AND PELVIS WITHOUT CONTRAST TECHNIQUE:  Multidetector CT imaging of the abdomen and pelvis was performed following the standard protocol without IV contrast. COMPARISON:  July 22, 2020 FINDINGS: Lower chest: Mild atelectasis is seen within the bilateral lung bases. Hepatobiliary: No focal liver abnormality is seen. Status post cholecystectomy. No biliary dilatation. Pancreas: Unremarkable. No pancreatic ductal dilatation or surrounding inflammatory changes. Spleen: Normal in size  without focal abnormality. Adrenals/Urinary Tract: Adrenal glands are unremarkable. Kidneys are normal, without renal calculi, focal lesion, or hydronephrosis. Bladder is unremarkable. Stomach/Bowel: Stomach is within normal limits. Appendix appears normal. No evidence of bowel wall thickening, distention, or inflammatory changes. Vascular/Lymphatic: Aortic atherosclerosis. No enlarged abdominal or pelvic lymph nodes. Reproductive: Prostate is unremarkable. Other: There is a small, stable fat containing right inguinal hernia. No abdominopelvic ascites. Musculoskeletal: No acute or significant osseous findings. IMPRESSION: 1. Evidence of prior cholecystectomy. 2. No acute or active process within the abdomen or pelvis. Electronically Signed   By: Virgina Norfolk M.D.   On: 08/20/2020 22:23    EKG: I independently viewed the EKG done and my findings are as followed: EKG was not done in the ED  Assessment/Plan Present on Admission: . GI bleed . Abdominal pain . HLD (hyperlipidemia) . HTN (hypertension) . Hyperglycemia due to diabetes mellitus ([place]) . Vomiting  Active Problems:   GI bleed   HLD (hyperlipidemia)   HTN (hypertension)   Abdominal pain   Obesity, Class III, BMI 40-49.9 (morbid obesity) ([facility])   ESRD (end stage renal disease) on dialysis ([facility])   Vomiting   Hyperglycemia due to diabetes mellitus ([facility])  Abdominal pain, bloody diarrhea and vomiting in the setting of acute GI bleed H/H= 10.2/31.9, this was 10.2/32.0 on 08/16/20 Hemoccult was  positive Continue IV Protonix  Continue Percocet per home regimen Gastroenterologist was consulted by ED physician and will see patient in the morning  Hyperglycemia secondary to T2DM Continue SSI and hypoglycemia protocol  ESRD on HD (MWF) Last dialysis was on Friday (5/6) Nephrology will be consulted for maintenance dialysis while being hospitalized  Chronic systolic and diastolic CHF Echo done on 5 2/21 showed an EF of 50-50%, no wall motion abnormality, trivial TR Continue torsemide  Essential hypertension (controlled) Continue amlodipine, torsemide  Hyperlipidemia Continue Lipitor  Right BKA Continue PT eval  Obesity class III (BMI 45.0) Patient will be counseled on lifestyle and diet modification  DVT prophylaxis: Heparin  Code Status: Full code  Family Communication: None at bedside  Disposition Plan:  Patient is from:                        home Anticipated DC to:                   home Anticipated DC date:               1 day Anticipated DC barriers:           Patient requires inpatient management due to GI bleed and pending GI consult  Consults called: Gastroenterology  Admission status: Observation   Bernadette Hoit MD [facility]  08/21/2020, 12:24 AM
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
## 2020-08-20 NOTE — ED Notes (Signed)
Occult stool card obtained by Dr. Sabra Heck with this nurse at bedside. Faintly positive results.
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
## 2020-08-20 NOTE — ED Triage Notes (Signed)
Pt c/o of blood in stool since last night with lower abdominal pain.

## 2020-08-21 ENCOUNTER — Encounter (HOSPITAL_COMMUNITY): Payer: Self-pay | Admitting: Internal Medicine

## 2020-08-21 DIAGNOSIS — R1084 Generalized abdominal pain: Secondary | ICD-10-CM | POA: Diagnosis not present

## 2020-08-21 DIAGNOSIS — K922 Gastrointestinal hemorrhage, unspecified: Secondary | ICD-10-CM | POA: Diagnosis not present

## 2020-08-21 DIAGNOSIS — N186 End stage renal disease: Secondary | ICD-10-CM | POA: Diagnosis not present

## 2020-08-21 DIAGNOSIS — R197 Diarrhea, unspecified: Secondary | ICD-10-CM | POA: Diagnosis not present

## 2020-08-21 LAB — COMPREHENSIVE METABOLIC PANEL
ALT: 14 U/L (ref 0–44)
AST: 19 U/L (ref 15–41)
Albumin: 3 g/dL — ABNORMAL LOW (ref 3.5–5.0)
Alkaline Phosphatase: 110 U/L (ref 38–126)
Anion gap: 9 (ref 5–15)
BUN: 52 mg/dL — ABNORMAL HIGH (ref 6–20)
CO2: 25 mmol/L (ref 22–32)
Calcium: 8.7 mg/dL — ABNORMAL LOW (ref 8.9–10.3)
Chloride: 106 mmol/L (ref 98–111)
Creatinine, Ser: 4.32 mg/dL — ABNORMAL HIGH (ref 0.61–1.24)
GFR, Estimated: 15 mL/min — ABNORMAL LOW (ref >60)
Glucose, Bld: 74 mg/dL (ref 70–99)
Potassium: 4.4 mmol/L (ref 3.5–5.1)
Sodium: 140 mmol/L (ref 135–145)
Total Bilirubin: 0.4 mg/dL (ref 0.3–1.2)
Total Protein: 7 g/dL (ref 6.5–8.1)

## 2020-08-21 LAB — GLUCOSE, CAPILLARY
Glucose-Capillary: 128 mg/dL — ABNORMAL HIGH (ref 70–99)
Glucose-Capillary: 69 mg/dL — ABNORMAL LOW (ref 70–99)
Glucose-Capillary: 89 mg/dL (ref 70–99)

## 2020-08-21 LAB — CBC
HCT: 29.8 % — ABNORMAL LOW (ref 39.0–52.0)
Hemoglobin: 9.1 g/dL — ABNORMAL LOW (ref 13.0–17.0)
MCH: 28.7 pg (ref 26.0–34.0)
MCHC: 30.5 g/dL (ref 30.0–36.0)
MCV: 94 fL (ref 80.0–100.0)
Platelets: 263 10*3/uL (ref 150–400)
RBC: 3.17 MIL/uL — ABNORMAL LOW (ref 4.22–5.81)
RDW: 17.9 % — ABNORMAL HIGH (ref 11.5–15.5)
WBC: 6.8 10*3/uL (ref 4.0–10.5)
nRBC: 0 % (ref 0.0–0.2)

## 2020-08-21 LAB — PHOSPHORUS: Phosphorus: 6.5 mg/dL — ABNORMAL HIGH (ref 2.5–4.6)

## 2020-08-21 LAB — PROTIME-INR
INR: 1.1 (ref 0.8–1.2)
Prothrombin Time: 14 seconds (ref 11.4–15.2)

## 2020-08-21 LAB — APTT: aPTT: 32 seconds (ref 24–36)

## 2020-08-21 LAB — MAGNESIUM: Magnesium: 2.1 mg/dL (ref 1.7–2.4)

## 2020-08-21 LAB — HEMOGLOBIN A1C
Hgb A1c MFr Bld: 7.8 % — ABNORMAL HIGH (ref 4.8–5.6)
Mean Plasma Glucose: 177.16 mg/dL

## 2020-08-21 MED ORDER — TORSEMIDE 20 MG PO TABS
100.0000 mg | ORAL_TABLET | ORAL | Status: DC
  Administered 2020-08-21: 100 mg via ORAL
  Filled 2020-08-21: qty 5

## 2020-08-21 MED ORDER — ATORVASTATIN CALCIUM 20 MG PO TABS: 20.0000 mg | ORAL_TABLET | Freq: Every day | ORAL | Status: DC

## 2020-08-21 MED ORDER — AMLODIPINE BESYLATE 5 MG PO TABS
5.0000 mg | ORAL_TABLET | Freq: Every day | ORAL | Status: DC
  Administered 2020-08-21: 5 mg via ORAL
  Filled 2020-08-21: qty 1

## 2020-08-21 MED ORDER — INSULIN ASPART 100 UNIT/ML IJ SOLN: 0.0000 [IU] | INTRAMUSCULAR | Status: DC

## 2020-08-21 MED ORDER — PANTOPRAZOLE SODIUM 40 MG IV SOLR
40.0000 mg | Freq: Two times a day (BID) | INTRAVENOUS | Status: DC
  Administered 2020-08-21 (×2): 40 mg via INTRAVENOUS
  Filled 2020-08-21 (×2): qty 40

## 2020-08-21 MED ORDER — OXYCODONE-ACETAMINOPHEN 5-325 MG PO TABS
1.0000 | ORAL_TABLET | Freq: Four times a day (QID) | ORAL | Status: DC | PRN
Start: 2020-08-21 — End: 2020-08-21
  Administered 2020-08-21 (×2): 1 via ORAL
  Filled 2020-08-21 (×2): qty 1

## 2020-08-21 NOTE — Plan of Care (Signed)
  Problem: Activity: Goal: Risk for activity intolerance will decrease Outcome: Progressing   Problem: Elimination: Goal: Will not experience complications related to bowel motility Outcome: Progressing   Problem: Elimination: Goal: Will not experience complications related to urinary retention Outcome: Progressing   

## 2020-08-21 NOTE — Consult Note (Signed)
Maylon Peppers, M.D. Gastroenterology & Hepatology                                           Patient Name: Zachary Young Account #: @FLAACCTNO @   MRN: [id number] Admission Date: 08/20/2020 Date of Evaluation:  08/21/2020 Time of Evaluation: 10:33 AM   Referring Physician: Raiford Noble, DO  Chief Complaint:  Rectal bleeding  HPI:  This is a 60 y.o. male with past medical history CHF, diabetes, end-stage renal disease on dialysis, cocaine abuse, osteomyelitis status post right BKA, hyperlipidemia, coronary artery disease, who came to the hospital after presenting episodes of rectal bleeding.  The patient reports that yesterday night presented for episodes of watery bloody bowel movements.  This persisted today in the morning but he has resolved and his last bowel movement.  He also endorsed having some abdominal pain in his lower abdomen.  Had few episodes of vomiting of alimentary contents.  Never had similar symptoms in the past.  Patient reports that he had a colonoscopy Close to 10 years ago, reports it was normal but no report is available.  Patient denies taking any NSAIDs or any high-dose antiplatelets/anticoagulation.  States that the last time he used cocaine was last week "to celebrate his birthday".  In the ED, the patient was hemodynamically stable and afebrile.  Labs upon admission showed hemoglobin of 10.2 with normal white blood cell count of 9.9 and platelets of 262.  CMP showed BUN of 48 and creatinine of 3.9 at baseline, normal electrolytes and liver function tests.  INR was 1.1. CT A/P w/o contrast was within normal limits.  Past Medical History: SEE CHRONIC ISSSUES: Past Medical History:  Diagnosis Date  . CHF (congestive heart failure) ([place])   . Diabetes mellitus without complication ([place])   . Dialysis patient ([place])   . Peripheral edema   . Renal disorder    kidney disease   Past Surgical History:  Past Surgical History:  Procedure Laterality Date  . AMPUTATION  Right 10/21/2019   Procedure: RIGHT BELOW KNEE AMPUTATION;  Surgeon: Newt Minion, MD;  Location: [place];  Service: Orthopedics;  Laterality: Right;  . AV FISTULA PLACEMENT Left 11/13/2019   Procedure: LEFT ARTERIOVENOUS (AV) FISTULA VERSUS ARTERIOVENOUS GRAFT;  Surgeon: Rosetta Posner, MD;  Location: [facility] OR;  Service: Vascular;  Laterality: Left;  . AV FISTULA PLACEMENT Left 04/05/2020   Procedure: INSERTION OF LEFT ARM ARTERIOVENOUS (AV) GORE-TEX GRAFT;  Surgeon: Rosetta Posner, MD;  Location: [facility] OR;  Service: Vascular;  Laterality: Left;  . AV FISTULA PLACEMENT Right 07/14/2020   Procedure: RIGHT ARM ARTERIOVENOUS (AV) GRAFT CREATION;  Surgeon: Rosetta Posner, MD;  Location: [facility];  Service: Vascular;  Laterality: Right;  . BASCILIC VEIN TRANSPOSITION Left 01/27/2020   Procedure: LEFT ARM 2ND STAGE BASCILIC VEIN TRANSPOSITION;  Surgeon: Rosetta Posner, MD;  Location: [place];  Service: Vascular;  Laterality: Left;  . DIALYSIS/PERMA CATHETER INSERTION N/A 03/03/2020   Procedure: DIALYSIS/PERMA CATHETER INSERTION;  Surgeon: Algernon Huxley, MD;  Location: [place] [facility];  Service: Cardiovascular;  Laterality: N/A;  . ESOPHAGOGASTRODUODENOSCOPY (EGD) WITH PROPOFOL N/A 06/17/2020   gastritis, normal duodenum.   . INSERTION OF DIALYSIS CATHETER N/A 04/05/2020   Procedure: INSERTION OF TUNNEL  DIALYSIS CATHETER LEFT INTERNAL JUGULAR;  Surgeon: Rosetta Posner, MD;  Location: [place];  Service: Vascular;  Laterality: N/A;  .  IR FLUORO GUIDE CV LINE RIGHT  11/05/2019  . IR FLUORO GUIDE CV LINE RIGHT  06/07/2020  . IR REMOVAL TUN CV CATH W/O FL  06/01/2020  . IR THROMBECTOMY AV FISTULA W/THROMBOLYSIS/PTA INC/SHUNT/IMG LEFT Left 06/06/2020  . IR US GUIDE VASC ACCESS LEFT  06/06/2020  . IR US GUIDE VASC ACCESS RIGHT  11/05/2019  . IR US GUIDE VASC ACCESS RIGHT  06/07/2020  . REMOVAL OF A DIALYSIS CATHETER Right 04/05/2020   Procedure: REMOVAL OF RIGHT CHEST DIALYSIS CATHETER;  Surgeon: Rosetta Posner, MD;  Location: [place];  Service: Vascular;  Laterality: Right;  . TOE AMPUTATION Bilateral    due to osteomyelitis, all 5 each foot   Family History:  Family History  Problem Relation Age of Onset  . Cancer Mother        uknown type of cancer  . Colon cancer Neg Hx   . Colon polyps Neg Hx    Social History:  Social History   Tobacco Use  . Smoking status: Current Every Day Smoker    Packs/day: 0.50    Types: Cigarettes  . Smokeless tobacco: Never Used  Vaping Use  . Vaping Use: Never used  Substance Use Topics  . Alcohol use: Not Currently  . Drug use: Not Currently    Types: Cocaine    Comment: last cocaine use a few months ago; denied 08/02/20    Home Medications:  Prior to Admission medications   Medication Sig Start Date End Date Taking? Authorizing Provider  amLODipine (NORVASC) 5 MG tablet Take 1 tablet (5 mg total) by mouth daily. 06/28/20   Orson Eva, MD  aspirin EC 81 MG EC tablet Take 1 tablet (81 mg total) by mouth daily. Patient taking differently: Take 81 mg by mouth 2 (two) times daily. 08/21/19   Gladys Damme, MD  atorvastatin (LIPITOR) 20 MG tablet Take 1 tablet (20 mg total) by mouth daily with supper. 06/28/20   Orson Eva, MD  dicyclomine (BENTYL) 20 MG tablet Take 1 tablet (20 mg total) by mouth 2 (two) times daily. 07/22/20   Margarita Mail, PA-C  ergocalciferol (VITAMIN D2) 1.25 MG (50000 UT) capsule Take 1 capsule (50,000 Units total) by mouth every Friday. 12/11/19   Samella Parr, NP  insulin glargine (LANTUS) 100 UNIT/ML injection Inject 0.35 mLs (35 Units total) into the skin at bedtime. Patient taking differently: Inject 25 Units into the skin at bedtime. 12/11/19   Samella Parr, NP  NOVOLOG FLEXPEN 100 UNIT/ML FlexPen Inject 4 Units into the skin 3 (three) times daily with meals. 05/24/20   [provider]  oxyCODONE-acetaminophen (PERCOCET) 5-325 MG tablet Take 1 tablet by mouth every 6 (six) hours as needed. 08/16/20   Milton Ferguson, MD  pantoprazole  (PROTONIX) 40 MG tablet Take 1 tablet (40 mg total) by mouth 2 (two) times daily before a meal. 08/02/20   Annitta Needs, NP  phenytoin (DILANTIN) 100 MG ER capsule Take 3 capsules (300 mg total) by mouth at bedtime. 06/15/20   Garvin Fila, MD  sucralfate (CARAFATE) 1 g tablet Take 1 tablet (1 g total) by mouth 4 (four) times daily -  with meals and at bedtime. 07/22/20   Margarita Mail, PA-C  torsemide (DEMADEX) 100 MG tablet Take 1 tablet (100 mg total) by mouth daily. On Monday-Wednesday-Friday-Sunday (non-dialysis days) Patient taking differently: Take 100 mg by mouth 4 (four) times a week. On Monday-Wednesday-Friday-Sunday (non-dialysis days) 06/28/20   Orson Eva, MD  Inpatient Medications:  Current Facility-Administered Medications:  .  amLODipine (NORVASC) tablet 5 mg, 5 mg, Oral, Daily, Adefeso, Oladapo, DO, 5 mg at 08/21/20 1023 .  atorvastatin (LIPITOR) tablet 20 mg, 20 mg, Oral, Q supper, Adefeso, Oladapo, DO .  insulin aspart (novoLOG) injection 0-6 Units, 0-6 Units, Subcutaneous, Q4H, Adefeso, Oladapo, DO .  oxyCODONE-acetaminophen (PERCOCET/ROXICET) 5-325 MG per tablet 1 tablet, 1 tablet, Oral, Q6H PRN, Adefeso, Oladapo, DO, 1 tablet at 08/21/20 1023 .  pantoprazole (PROTONIX) injection 40 mg, 40 mg, Intravenous, Q12H, Adefeso, Oladapo, DO, 40 mg at 08/21/20 1024 .  torsemide [facility]) tablet 100 mg, 100 mg, Oral, Once per day on Sun Tue Thu Sat, Adefeso, Oladapo, DO, 100 mg at 08/21/20 1023 Allergies: Bee venom, Propofol, Eggs or egg-derived products, Morphine, and Oxycodone  Complete Review of Systems: GENERAL: negative for malaise, night sweats HEENT: No changes in hearing or vision, no nose bleeds or other nasal problems. NECK: Negative for lumps, goiter, pain and significant neck swelling RESPIRATORY: Negative for cough, wheezing CARDIOVASCULAR: Negative for chest pain, leg swelling, palpitations, orthopnea GI: SEE HPI MUSCULOSKELETAL: Negative for joint pain or swelling,  back pain, and muscle pain. SKIN: Negative for lesions, rash PSYCH: Negative for sleep disturbance, mood disorder and recent psychosocial stressors. HEMATOLOGY Negative for prolonged bleeding, bruising easily, and swollen nodes. ENDOCRINE: Negative for cold or heat intolerance, polyuria, polydipsia and goiter. NEURO: negative for tremor, gait imbalance, syncope and seizures. The remainder of the review of systems is noncontributory.  Physical Exam: BP 134/72 (BP Location: Left Arm)   Pulse 79   Temp 98.3 F (36.8 C)   Resp 15   Ht 6' 3"  (1.905 m)   Wt (!) 163.3 kg   SpO2 97%   BMI 45.00 kg/m  GENERAL: The patient is AO x3, in no acute distress. Obese. HEENT: Head is normocephalic and atraumatic. EOMI are intact. Mouth is well hydrated and without lesions. NECK: Supple. No masses LUNGS: Clear to auscultation. No presence of rhonchi/wheezing/rales. Adequate chest expansion HEART: RRR, normal s1 and s2. ABDOMEN: Soft, nontender, no guarding, no peritoneal signs, and nondistended. BS +. No masses. EXTREMITIES: Has a R BKA. Without any cyanosis, clubbing, rash, lesions or edema. NEUROLOGIC: AOx3, no focal motor deficit. SKIN: no jaundice, no rashes  Laboratory Data CBC:     Component Value Date/Time   WBC 6.8 08/21/2020 0557   RBC 3.17 (L) 08/21/2020 0557   HGB 9.1 (L) 08/21/2020 0557   HCT 29.8 (L) 08/21/2020 0557   PLT 263 08/21/2020 0557   MCV 94.0 08/21/2020 0557   MCH 28.7 08/21/2020 0557   MCHC 30.5 08/21/2020 0557   RDW 17.9 (H) 08/21/2020 0557   LYMPHSABS 1.7 08/20/2020 0828   MONOABS 0.7 08/20/2020 0828   EOSABS 0.1 08/20/2020 0828   BASOSABS 0.0 08/20/2020 0828   COAG:  Lab Results  Component Value Date   INR 1.1 08/21/2020   INR 1.0 07/22/2020   INR 1.0 06/16/2020    BMP:  BMP Latest Ref Rng & Units 08/21/2020 08/20/2020 08/20/2020  Glucose 70 - 99 mg/dL 74 157(H) 93  BUN 6 - 20 mg/dL 52(H) 50(H) 48(H)  Creatinine 0.61 - 1.24 mg/dL 4.32(H) 4.21(H) 3.90(H)   Sodium 135 - 145 mmol/L 140 138 137  Potassium 3.5 - 5.1 mmol/L 4.4 4.6 4.2  Chloride 98 - 111 mmol/L 106 106 102  CO2 22 - 32 mmol/L 25 21(L) 25  Calcium 8.9 - 10.3 mg/dL 8.7(L) 8.7(L) 8.6(L)    HEPATIC:  Hepatic Function  Latest Ref Rng & Units 08/21/2020 08/20/2020 08/16/2020  Total Protein 6.5 - 8.1 g/dL 7.0 7.8 7.5  Albumin 3.5 - 5.0 g/dL 3.0(L) 3.3(L) 3.2(L)  AST 15 - 41 U/L 19 12(L) 10(L)  ALT 0 - 44 U/L 14 11 10   Alk Phosphatase 38 - 126 U/L 110 117 115  Total Bilirubin 0.3 - 1.2 mg/dL 0.4 0.2(L) 0.5    CARDIAC: No results found for: CKTOTAL, CKMB, CKMBINDEX, TROPONINI   Imaging: I personally reviewed and interpreted the available imaging.  Assessment & Plan: Zachary Young is a 60 y.o. male with past medical history CHF, diabetes, end-stage renal disease on dialysis, cocaine abuse, osteomyelitis status post right BKA, hyperlipidemia, coronary artery disease, who came to the hospital after presenting episodes of rectal bleeding.  The patient is presenting symptoms of lower gastrointestinal bleeding of unclear etiology, this could be related to an episode of diverticular bleeding or hemorrhoidal bleeding, although he presented some abdominal pain which raises the concern of ischemic colitis especially in the setting of recent cocaine use.  However, there is no presence of inflammation in most recent CT scan performed in the ER.  I explained to patient that we will need to investigate his bleeding further with a colonoscopy but will need to check for U tox to determine if he has any presence of cocaine that would preclude performing any procedures under sedation.  The patient understood and agreed.  If the endoscopic ultrasound is negative for cocaine we will schedule for colonoscopy tomorrow.  # Lower gastrointestinal bleeding,  # Cocaine abuse - Repeat CBC qday, transfuse if Hb <7 - 2 large bore IV lines - Active T/S - Avoid NSAIDs/AC. - Check U Tox today - Will proceed with  colonoscopy tomorrow if Utox is negative  Harvel Quale, MD Gastroenterology and Hepatology [facility]

## 2020-08-21 NOTE — Progress Notes (Signed)
Patient refusing bed alarm at this time.  Explained the importance of the bed alarm for the patient's safety.  Patient still refusing bed alarm.  Unable to place fall mats d/t pt's motorized wheelchair, which is bedside.

## 2020-08-21 NOTE — Discharge Summary (Signed)
Physician Discharge Summary  Zachary Young [id number] DOB: 1960-10-04 DOA: 08/20/2020  PCP: Mellissa Kohut, DO  Admit date: 08/20/2020 Discharge date: 08/21/2020  Admitted From: Home Disposition: Left AMA  Recommendations for Outpatient Follow-up:  1. Follow up with PCP in 1-2 weeks 2. Follow up with Gastroenterology within 1-2 weeks  3. Please obtain CMP/CBC, Mag. Phos in one week  Home Health: No Equipment/Devices: None  Discharge Condition: Guarded CODE STATUS: FULL CODE Diet recommendation: Heart Healthy Carb Modified Diet  Brief/Interim Summary: HPI per Dr. Bernadette Hoit on 08/20/20 Zachary Young is a 60 y.o. male with medical history significant for ESRD(MWF),diabetes mellitus type 2, hyperlipidemia, coronary artery disease, osteomyelitis status post right BKA, chronic volume overload who presents to the emergency department complaining of abdominal pain which started last night (5/6), this was followed with some watery diarrhea.  Initially, he only noticed some blood on the tissue paper, this was subsequently followed with 4 episodes of watery or bloody diarrhea.  Abdominal pain was sharp in nature, it was intermittent and rated as 10/10 on pain scale.  He endorsed 4 episodes of vomiting today (he was not sure if it was bloody or not).  Patient presented to the ED this morning, but he was upset about blood draw and left AMA.  He returned this afternoon due to ongoing bloody watery stool and the abdominal pain.  He denies chest pain, shortness of breath, fever, chills, any sick contacts  ED Course:  In the emergency department, hemodynamically stable, BP was 161/76.  Work-up in the ED showed normocytic anemia, BUN to creatinine 50/4.21, CBG 157.  Lipase 45, FOBT was positive.  CT abdomen and pelvis without contrast showed no acute active process within the abdomen or pelvis He was treated with Percocet.  Gastroenterology (Dr. Jenetta Downer) was consulted and recommended admitting  patient with plan to see patient in the morning.  **Interim History Patient was seen and examined and he was having bloody diarrhea and vomiting in the setting of acute GI bleed.  Hemoccult was positive and the GI team evaluated and offer the patient a colonoscopy.  Patient subsequently was going to do and not happy about it but then changed his mind and signed out Zachary Young.  We will need to follow-up with his primary gastroenterologist in the outpatient setting now that he has signed out Zachary Young.  Discharge Diagnoses:  Active Problems:   GI bleed   HLD (hyperlipidemia)   HTN (hypertension)   Abdominal pain   Obesity, Class III, BMI 40-49.9 (morbid obesity) (HCC)   ESRD (end stage renal disease) on dialysis (HCC)   Vomiting   Diarrhea   Hyperglycemia due to diabetes mellitus (HCC)  Abdominal pain, bloody diarrhea and vomiting in the setting of acute GI bleed ABLA superimposed on Anemia of Chronic Kidney Disease -H/H= 10.2/31.9 on admission, this was 10.2/32.0 on 08/16/20; Now Hgb/Hct is 9.1/29.8 -Hemoccult was positive -Continue IV Protonix  -Continue Percocet per home regimen -Gastroenterologist was consulted by ED physician and offered patient Colon and initially patient was going to consider doing it but then was not happy about getting a UTox because he had "Cocaine on his birthday" and subsequently signed out AM  Hyperglycemia secondary to T2DM -Continue SSI and hypoglycemia protocol -CBGs ranging from 69-129  ESRD on HD (MWF) -Last dialysis was on Friday (5/6) -Nephrology will be consulted for maintenance dialysis while being hospitalized however he signed out -Zachary Young went from 48/3.90 -> 50/4.21 -> 52/4.32 -Patient signed  out AMA  Chronic Systolic and Diastolic CHF -Echo done on 5 2/21 showed an EF of 50-50%, no wall motion abnormality, trivial TR -Continue Torsemide -Patient dialyzes Monday Wednesday Friday -Signed  out AMA   Essential Hypertension (controlled) -Continue amlodipine, torsemide  Hyperlipidemia -Continue Lipitor  Right BKA -Continue PT eval but patient signed out AGAINST MEDICAL ADVICE  Obesity class III (BMI 0000000) -Complicates overall prognosis and care -Estimated body mass index is 45 kg/m as calculated from the following:   Height as of this encounter: '6\' 3"'$  (1.905 m).   Weight as of this encounter: 163.3 kg. -Weight Loss and Dietary Counseling given and then subsequently patient can adjust medical  Discharge Instructions  Allergies as of 08/21/2020      Reactions   Bee Venom Anaphylaxis   Per pt, nearly died from bee sting as a child   Propofol Other (See Comments)    Hallucinations   Eggs Or Egg-derived Products Nausea And Vomiting, Other (See Comments)   Stomach cramps in large amounts   Morphine Nausea And Vomiting   Oxycodone Nausea And Vomiting      Medication List    TAKE these medications   amLODipine 5 MG tablet Commonly known as: NORVASC Take 1 tablet (5 mg total) by mouth daily.   aspirin 81 MG EC tablet Take 1 tablet (81 mg total) by mouth daily. What changed: when to take this   atorvastatin 20 MG tablet Commonly known as: LIPITOR Take 1 tablet (20 mg total) by mouth daily with supper.   dicyclomine 20 MG tablet Commonly known as: BENTYL Take 1 tablet (20 mg total) by mouth 2 (two) times daily.   ergocalciferol 1.25 MG (50000 UT) capsule Commonly known as: VITAMIN D2 Take 1 capsule (50,000 Units total) by mouth every Friday.   insulin glargine 100 UNIT/ML injection Commonly known as: LANTUS Inject 0.35 mLs (35 Units total) into the skin at bedtime. What changed: how much to take   NovoLOG FlexPen 100 UNIT/ML FlexPen Generic drug: insulin aspart Inject 4 Units into the skin 3 (three) times daily with meals.   oxyCODONE-acetaminophen 5-325 MG tablet Commonly known as: Percocet Take 1 tablet by mouth every 6 (six) hours as needed.    pantoprazole 40 MG tablet Commonly known as: PROTONIX Take 1 tablet (40 mg total) by mouth 2 (two) times daily before a meal.   phenytoin 100 MG ER capsule Commonly known as: DILANTIN Take 3 capsules (300 mg total) by mouth at bedtime.   sucralfate 1 g tablet Commonly known as: Carafate Take 1 tablet (1 g total) by mouth 4 (four) times daily -  with meals and at bedtime.   torsemide 100 MG tablet Commonly known as: DEMADEX Take 1 tablet (100 mg total) by mouth daily. On Monday-Wednesday-Friday-Sunday (non-dialysis days) What changed: when to take this       Allergies  Allergen Reactions  . Bee Venom Anaphylaxis    Per pt, nearly died from bee sting as a child  . Propofol Other (See Comments)     Hallucinations  . Eggs Or Egg-Derived Products Nausea And Vomiting and Other (See Comments)    Stomach cramps in large amounts  . Morphine Nausea And Vomiting  . Oxycodone Nausea And Vomiting   Consultations:  Gastroenterology  Procedures/Studies: CT ABDOMEN PELVIS WO CONTRAST  Result Date: 08/20/2020 CLINICAL DATA:  Bloody stool and lower abdominal pain. EXAM: CT ABDOMEN AND PELVIS WITHOUT CONTRAST TECHNIQUE: Multidetector CT imaging of the abdomen and pelvis was performed  following the standard protocol without IV contrast. COMPARISON:  July 22, 2020 FINDINGS: Lower chest: Mild atelectasis is seen within the bilateral lung bases. Hepatobiliary: No focal liver abnormality is seen. Status post cholecystectomy. No biliary dilatation. Pancreas: Unremarkable. No pancreatic ductal dilatation or surrounding inflammatory changes. Spleen: Normal in size without focal abnormality. Adrenals/Urinary Tract: Adrenal glands are unremarkable. Kidneys are normal, without renal calculi, focal lesion, or hydronephrosis. Bladder is unremarkable. Stomach/Bowel: Stomach is within normal limits. Appendix appears normal. No evidence of bowel wall thickening, distention, or inflammatory changes.  Vascular/Lymphatic: Aortic atherosclerosis. No enlarged abdominal or pelvic lymph nodes. Reproductive: Prostate is unremarkable. Other: There is a small, stable fat containing right inguinal hernia. No abdominopelvic ascites. Musculoskeletal: No acute or significant osseous findings. IMPRESSION: 1. Evidence of prior cholecystectomy. 2. No acute or active process within the abdomen or pelvis. Electronically Signed   By: Virgina Norfolk M.D.   On: 08/20/2020 22:23   CT ABDOMEN PELVIS WO CONTRAST  Result Date: 07/22/2020 CLINICAL DATA:  Abdominal pain with vomiting. EXAM: CT ABDOMEN AND PELVIS WITHOUT CONTRAST TECHNIQUE: Multidetector CT imaging of the abdomen and pelvis was performed following the standard protocol without IV contrast. COMPARISON:  06/23/2020 FINDINGS: Lower chest: Mosaic attenuation in the lung bases is nonspecific but likely reflects air trapping from small airways disease. Hepatobiliary: No focal abnormality in the liver on this study without intravenous contrast. Gallbladder is surgically absent. No intrahepatic or extrahepatic biliary dilation. Pancreas: No focal mass lesion. No dilatation of the main duct. No intraparenchymal cyst. No peripancreatic edema. Spleen: No splenomegaly. No focal mass lesion. Adrenals/Urinary Tract: No adrenal nodule or mass. Kidneys unremarkable. No evidence for hydroureter. The urinary bladder appears normal for the degree of distention. Stomach/Bowel: Stomach is unremarkable. No gastric wall thickening. No evidence of outlet obstruction. Duodenum is normally positioned as is the ligament of Treitz. No small bowel wall thickening. No small bowel dilatation. The terminal ileum is normal. The appendix is normal. No gross colonic mass. No colonic wall thickening. Scattered areas of peristalsis are seen in transverse and left colon. Vascular/Lymphatic: There is abdominal aortic atherosclerosis without aneurysm. There is no gastrohepatic or hepatoduodenal ligament  lymphadenopathy. No retroperitoneal or mesenteric lymphadenopathy. 11 mm low left para-aortic node on image 53/series 2 is stable in the interval and also unchanged compared back to a study from 12/14/2018 suggesting reactive etiology. Tiny lymph nodes are seen along the external iliac chain bilaterally, also stable since 2020. Upper normal to mildly enlarged bilateral groin lymph nodes are stable since 12/14/2018. Reproductive: The prostate gland and seminal vesicles are unremarkable. Other: No intraperitoneal free fluid. Musculoskeletal: Small right groin hernia contains only fat. No worrisome lytic or sclerotic osseous abnormality. IMPRESSION: 1. No acute findings in the abdomen or pelvis. Specifically, no findings to explain the patient's history of abdominal pain with nausea and vomiting. 2. Subtle peripancreatic edema seen on the previous study has resolved in the interval. 3. Stable borderline to mild bilateral groin lymphadenopathy is stable comparing back to 2020, suggesting reactive etiology. 4. Small right groin hernia contains only fat. 5. Aortic Atherosclerosis (ICD10-I70.0). Electronically Signed   By: Misty Stanley M.D.   On: 07/22/2020 18:06   DG Elbow Complete Left  Result Date: 08/14/2020 CLINICAL DATA:  60 year old male status post fall with persistent pain for several days. Dialysis patient. EXAM: LEFT ELBOW - COMPLETE 3+ VIEW COMPARISON:  Left forearm series today. FINDINGS: Bone mineralization is within normal limits. Medial antecubital fossa surgical clips likely dialysis access related. Preserved  alignment at the elbow. Mild Chondrocalcinosis which can be seen in the setting of calcium pyrophosphate deposition disease. No joint effusion. Radial head appears intact. No acute osseous abnormality identified. IMPRESSION: No acute fracture or dislocation identified about the left elbow. Electronically Signed   By: Genevie Ann M.D.   On: 08/14/2020 08:47   DG Forearm Left  Result Date:  08/14/2020 CLINICAL DATA:  60 year old male status post fall with persistent pain for several days. Dialysis patient. EXAM: LEFT FOREARM - 2 VIEW COMPARISON:  None. FINDINGS: Probably dialysis access related surgical clips at the medial antecubital fossa. Bone mineralization is within normal limits. Left radius and ulna appear intact. Alignment appears preserved at the wrist and elbow. No acute osseous abnormality identified. IMPRESSION: No acute fracture or dislocation identified about the left forearm. Electronically Signed   By: Genevie Ann M.D.   On: 08/14/2020 08:45   DG Wrist Complete Left  Result Date: 08/16/2020 CLINICAL DATA:  Fall. EXAM: LEFT WRIST - COMPLETE 3+ VIEW COMPARISON:  No prior. FINDINGS: Subtle fracture of midportion of the left scaphoid may be present. This is identified only on one view. MRI left wrist can be obtained to further evaluate. No other focal bony abnormality identified. IMPRESSION: Subtle fracture of the midportion of the left scaphoid may be present. This is identified on one view. MRI of the left wrist can be obtained to further evaluate. Electronically Signed   By: Marcello Moores  Register   On: 08/16/2020 07:26   US Venous Img Upper Right (DVT Study)  Result Date: 08/08/2020 CLINICAL DATA:  Right upper extremity swelling. EXAM: Right UPPER EXTREMITY VENOUS DOPPLER ULTRASOUND TECHNIQUE: Gray-scale sonography with graded compression, as well as color Doppler and duplex ultrasound were performed to evaluate the upper extremity deep venous system from the level of the subclavian vein and including the jugular, axillary, basilic, radial, ulnar and upper cephalic vein. Spectral Doppler was utilized to evaluate flow at rest and with distal augmentation maneuvers. COMPARISON:  None. FINDINGS: Contralateral Subclavian Vein: Respiratory phasicity is normal and symmetric with the symptomatic side. No evidence of thrombus. Normal compressibility. Internal Jugular Vein: No evidence of thrombus.  Normal compressibility, respiratory phasicity and response to augmentation. Subclavian Vein: No evidence of thrombus. Normal compressibility, respiratory phasicity and response to augmentation. Axillary Vein: No evidence of thrombus. Normal compressibility, respiratory phasicity and response to augmentation. Cephalic Vein: No evidence of thrombus. Normal compressibility, respiratory phasicity and response to augmentation. Basilic Vein: No evidence of thrombus. Normal compressibility, respiratory phasicity and response to augmentation. Brachial Veins: No evidence of thrombus. Normal compressibility, respiratory phasicity and response to augmentation. Radial Veins: No evidence of thrombus. Normal compressibility, respiratory phasicity and response to augmentation. Ulnar Veins: No evidence of thrombus. Normal compressibility, respiratory phasicity and response to augmentation. Venous Reflux:  None visualized. Other Findings: Arteriovenous fistula is seen involving the right forearm and antecubital fossa which appears to be patent. IMPRESSION: No evidence of DVT within the right upper extremity. Electronically Signed   By: Marijo Conception M.D.   On: 08/08/2020 13:00   DG Chest Portable 1 View  Result Date: 08/10/2020 CLINICAL DATA:  Weakness. EXAM: PORTABLE CHEST 1 VIEW FINDINGS: Dialysis catheter noted stable position. Cardiomegaly with increase and bilateral interstitial prominence suggesting interstitial edema. Mild right mid lung subsegmental atelectasis again noted. No pleural effusion or pneumothorax. IMPRESSION: 1.  Dialysis catheter stable position. 2. Cardiomegaly with increase in bilateral interstitial prominence. Findings most consistent with interstitial edema. Electronically Signed   By: Marcello Moores  Register   On: 08/10/2020 12:58   DG Chest Portable 1 View  Result Date: 08/06/2020 CLINICAL DATA:  Shortness of breath EXAM: PORTABLE CHEST 1 VIEW COMPARISON:  06/23/2020 FINDINGS: Right IJ hemodialysis  catheter brains in place. Stable mild cardiomegaly. Mild vascular congestion. Mild diffuse interstitial prominence. No lobar consolidation. No significant pleural fluid collection. No pneumothorax. IMPRESSION: Findings suggestive of CHF with mild interstitial edema. Electronically Signed   By: Davina Poke D.O.   On: 08/06/2020 18:52   DG ABD ACUTE 2+V W 1V CHEST  Result Date: 08/16/2020 CLINICAL DATA:  Vomiting. EXAM: DG ABDOMEN ACUTE WITH 1 VIEW CHEST COMPARISON:  December 04, 2019 FINDINGS: There is no evidence of dilated bowel loops or free intraperitoneal air. A large amount of stool is seen throughout the colon. No radiopaque calculi or other significant radiographic abnormality is seen. A right-sided venous catheter is noted. The cardiac silhouette is borderline in size. Mild areas of atelectasis and/or early infiltrate are seen within the bilateral lung bases. IMPRESSION: 1. Large stool burden without evidence of bowel obstruction. 2. Mild bibasilar atelectasis and/or infiltrate. Electronically Signed   By: Virgina Norfolk M.D.   On: 08/16/2020 22:56   DG Hand Complete Left  Result Date: 08/16/2020 CLINICAL DATA:  Fall. EXAM: LEFT HAND - COMPLETE 3+ VIEW COMPARISON:  08/14/2020. FINDINGS: No acute bony or joint abnormality.  Soft tissues are unremarkable. IMPRESSION: No acute abnormality. Electronically Signed   By: Marcello Moores  Register   On: 08/16/2020 07:23   DG Hand Complete Left  Result Date: 08/14/2020 CLINICAL DATA:  60 year old male status post fall with persistent pain for several days. Dialysis patient. EXAM: LEFT HAND - COMPLETE 3+ VIEW COMPARISON:  None. FINDINGS: Bone mineralization is within normal limits. There is no evidence of acute fracture or dislocation. Healed 5th metacarpal fracture. There is no evidence of arthropathy or other focal bone abnormality. Diffuse soft tissue swelling at the wrist and distal forearm. IMPRESSION: Soft tissue swelling at the wrist and distal forearm.  No acute fracture or dislocation identified about the left hand. Electronically Signed   By: Genevie Ann M.D.   On: 08/14/2020 08:46   EEG adult        [facility] [place]. [place] [address] [phone]      Electroencephalogram Procedure Note Zachary Young Date of Birth:  [birth year] Medical Record Number:  [id number] Indications: Diagnostic Date of Procedure : 07/25/2020 medications: none Clinical history : 60 year old patient being evaluated for seizures Technical Description This study was performed using 17 channel digital electroencephalographic recording equipment. International 10-20 electrode placement was used. The record was obtained with the patient awake, drowsy and asleep.  The record is of fair technical quality for purposes of interpretation. Activation Procedures:  hyperventilation patient refused Photic stimulation . EEG Description Awake: Alpha Activity: The waking state record contains a well-defined bi-occipital alpha rhythm of  moderate amplitude with a dominant frequency of 8 Hz. Reactivity is present. No paroxsymal activity, spikes, or sharp waves are noted. Technical component of study is adequate. EKG tracing shows regular sinus rhythm Length of this recording is 21 minutes and 58 seconds Sleep: With drowsiness, there is attenuation of the background alpha activity. As the patient enters into light sleep, vertex waves and symmetrical spindles are noted. K complexes are noted in sleep. Transition to the waking state is unremarkable. Result of Activation Procedures: Hyperventilation: Hyperventilation for three minutes fails to activate the recording. Photo Stimulation: Photic stimulation failed to activated the  recording. Summary Normal electroencephalogram, awake, asleep and with activation procedures. There are no focal lateralizing or epileptiform features.    Subjective: Seen and examined at bedside this morning and he wanted to go outside.  No nausea  or vomiting.  GI evaluated and recommended colonoscopy and initially patient was agreeable however then changed his mind and signed out [place].  Patient was of sound mind when he signed out against medical vice understanding the risks of worsening, decompensation and possible death.   Discharge Exam: Vitals:   08/21/20 0111 08/21/20 0646  BP: (!) 148/76 134/72  Pulse: 78 79  Resp: 18 15  Temp: 97.9 F (36.6 C) 98.3 F (36.8 C)  SpO2: 99% 97%   Vitals:   08/20/20 2230 08/21/20 0000 08/21/20 0111 08/21/20 0646  BP: (!) 146/88 124/62 (!) 148/76 134/72  Pulse: 88 78 78 79  Resp: '20 20 18 15  '$ Temp:  98.4 F (36.9 C) 97.9 F (36.6 C) 98.3 F (36.8 C)  TempSrc:  Oral    SpO2: 95% 96% 99% 97%  Weight:      Height:       General: Pt is alert, awake and somewhat agitated, not in acute distress Cardiovascular: RRR, S1/S2 +, no rubs, no gallops Respiratory: Diminished bilaterally, no wheezing, no rhonchi Abdominal: Soft, NT, distended secondary habitus, bowel sounds + Extremities: Mild edema in his left leg and he has a right BKA, no cyanosis  The results of significant diagnostics from this hospitalization (including imaging, microbiology, ancillary and laboratory) are listed below for reference.    Microbiology: Recent Results (from the past 240 hour(s))  Resp Panel by RT-PCR (Flu A&B, Covid) Nasopharyngeal Swab     Status: None   Collection Time: 08/20/20 10:50 PM   Specimen: Nasopharyngeal Swab; Nasopharyngeal(NP) swabs in vial transport medium  Result Value Ref Range Status   SARS Coronavirus 2 by RT PCR NEGATIVE NEGATIVE Final    Comment: (NOTE) SARS-CoV-2 target nucleic acids are NOT DETECTED.  The SARS-CoV-2 RNA is generally detectable in upper respiratory specimens during the acute phase of infection. The lowest concentration of SARS-CoV-2 viral copies this assay can detect is 138 copies/mL. A negative result does not preclude SARS-Cov-2 infection and  should not be used as the sole basis for treatment or other patient management decisions. A negative result may occur with  improper specimen collection/handling, submission of specimen other than nasopharyngeal swab, presence of viral mutation(s) within the areas targeted by this assay, and inadequate number of viral copies(<138 copies/mL). A negative result must be combined with clinical observations, patient history, and epidemiological information. The expected result is Negative.  Fact Sheet for Patients:  EntrepreneurPulse.com.au  Fact Sheet for Healthcare Providers:  IncredibleEmployment.be  This test is no t yet approved or cleared by the [place] FDA and  has been authorized for detection and/or diagnosis of SARS-CoV-2 by FDA under an Emergency Use Authorization (EUA). This EUA will remain  in effect (meaning this test can be used) for the duration of the COVID-19 declaration under Section 564(b)(1) of the Act, 21 U.S.C.section 360bbb-3(b)(1), unless the authorization is terminated  or revoked sooner.       Influenza A by PCR NEGATIVE NEGATIVE Final   Influenza B by PCR NEGATIVE NEGATIVE Final    Comment: (NOTE) The Xpert Xpress SARS-CoV-2/FLU/RSV plus assay is intended as an aid in the diagnosis of influenza from Nasopharyngeal swab specimens and should not be used as a sole basis for treatment. Nasal washings and  aspirates are unacceptable for Xpert Xpress SARS-CoV-2/FLU/RSV testing.  Fact Sheet for Patients: EntrepreneurPulse.com.au  Fact Sheet for Healthcare Providers: IncredibleEmployment.be  This test is not yet approved or cleared by the [place] FDA and has been authorized for detection and/or diagnosis of SARS-CoV-2 by FDA under an Emergency Use Authorization (EUA). This EUA will remain in effect (meaning this test can be used) for the duration of the COVID-19 declaration  under Section 564(b)(1) of the Act, 21 U.S.C. section 360bbb-3(b)(1), unless the authorization is terminated or revoked.  Performed at [facility], [address], [place], [place] [address]      Labs: BNP (last 3 results) Recent Labs    08/30/19 2152 06/23/20 1248  BNP 48.1 [id number]*   Basic Metabolic Panel: Recent Labs  Lab 08/16/20 2205 08/20/20 0828 08/20/20 2146 08/21/20 0557  NA 135 137 138 140  K 4.3 4.2 4.6 4.4  CL 101 102 106 106  CO2 24 25 21* 25  GLUCOSE 199* 93 157* 74  BUN 63* 48* 50* 52*  CREATININE 5.10* 3.90* 4.21* 4.32*  CALCIUM 8.0* 8.6* 8.7* 8.7*  MG  --   --   --  2.1  PHOS  --   --   --  6.5*   Liver Function Tests: Recent Labs  Lab 08/16/20 2205 08/20/20 0828 08/21/20 0557  AST 10* 12* 19  ALT '10 11 14  '$ ALKPHOS 115 117 110  BILITOT 0.5 0.2* 0.4  PROT 7.5 7.8 7.0  ALBUMIN 3.2* 3.3* 3.0*   Recent Labs  Lab 08/16/20 2205 08/20/20 0828  LIPASE 47 45   No results for input(s): AMMONIA in the last 168 hours. CBC: Recent Labs  Lab 08/16/20 2205 08/20/20 0828 08/20/20 2146 08/21/20 0557  WBC 7.7 9.9 9.8 6.8  NEUTROABS  --  7.3  --   --   HGB 10.2* 10.2* 10.1* 9.1*  HCT 32.0* 31.9* 32.8* 29.8*  MCV 90.4 92.2 93.7 94.0  PLT 241 262 249 263   Cardiac Enzymes: No results for input(s): CKTOTAL, CKMB, CKMBINDEX, TROPONINI in the last 168 hours. BNP: Invalid input(s): POCBNP CBG: Recent Labs  Lab 08/21/20 0113 08/21/20 0656 08/21/20 0748  GLUCAP 128* 69* 89   D-Dimer No results for input(s): DDIMER in the last 72 hours. Hgb A1c No results for input(s): HGBA1C in the last 72 hours. Lipid Profile No results for input(s): CHOL, HDL, LDLCALC, TRIG, CHOLHDL, LDLDIRECT in the last 72 hours. Thyroid function studies No results for input(s): TSH, T4TOTAL, T3FREE, THYROIDAB in the last 72 hours.  Invalid input(s): FREET3 Anemia work up No results for input(s): VITAMINB12, FOLATE, FERRITIN, TIBC, IRON, RETICCTPCT in the last 72  hours. Urinalysis    Component Value Date/Time   COLORURINE STRAW (A) 06/23/2020 1519   APPEARANCEUR CLEAR 06/23/2020 1519   LABSPEC 1.015 06/23/2020 1519   PHURINE 7.0 06/23/2020 1519   GLUCOSEU >=500 (A) 06/23/2020 1519   HGBUR SMALL (A) 06/23/2020 1519   BILIRUBINUR NEGATIVE 06/23/2020 1519   KETONESUR NEGATIVE 06/23/2020 1519   PROTEINUR >=300 (A) 06/23/2020 1519   NITRITE NEGATIVE 06/23/2020 1519   LEUKOCYTESUR NEGATIVE 06/23/2020 1519   Sepsis Labs Invalid input(s): PROCALCITONIN,  WBC,  LACTICIDVEN Microbiology Recent Results (from the past 240 hour(s))  Resp Panel by RT-PCR (Flu A&B, Covid) Nasopharyngeal Swab     Status: None   Collection Time: 08/20/20 10:50 PM   Specimen: Nasopharyngeal Swab; Nasopharyngeal(NP) swabs in vial transport medium  Result Value Ref Range Status   SARS Coronavirus 2 by  RT PCR NEGATIVE NEGATIVE Final    Comment: (NOTE) SARS-CoV-2 target nucleic acids are NOT DETECTED.  The SARS-CoV-2 RNA is generally detectable in upper respiratory specimens during the acute phase of infection. The lowest concentration of SARS-CoV-2 viral copies this assay can detect is 138 copies/mL. A negative result does not preclude SARS-Cov-2 infection and should not be used as the sole basis for treatment or other patient management decisions. A negative result may occur with  improper specimen collection/handling, submission of specimen other than nasopharyngeal swab, presence of viral mutation(s) within the areas targeted by this assay, and inadequate number of viral copies(<138 copies/mL). A negative result must be combined with clinical observations, patient history, and epidemiological information. The expected result is Negative.  Fact Sheet for Patients:  EntrepreneurPulse.com.au  Fact Sheet for Healthcare Providers:  IncredibleEmployment.be  This test is no t yet approved or cleared by the [place] FDA and  has  been authorized for detection and/or diagnosis of SARS-CoV-2 by FDA under an Emergency Use Authorization (EUA). This EUA will remain  in effect (meaning this test can be used) for the duration of the COVID-19 declaration under Section 564(b)(1) of the Act, 21 U.S.C.section 360bbb-3(b)(1), unless the authorization is terminated  or revoked sooner.       Influenza A by PCR NEGATIVE NEGATIVE Final   Influenza B by PCR NEGATIVE NEGATIVE Final    Comment: (NOTE) The Xpert Xpress SARS-CoV-2/FLU/RSV plus assay is intended as an aid in the diagnosis of influenza from Nasopharyngeal swab specimens and should not be used as a sole basis for treatment. Nasal washings and aspirates are unacceptable for Xpert Xpress SARS-CoV-2/FLU/RSV testing.  Fact Sheet for Patients: EntrepreneurPulse.com.au  Fact Sheet for Healthcare Providers: IncredibleEmployment.be  This test is not yet approved or cleared by the [place] FDA and has been authorized for detection and/or diagnosis of SARS-CoV-2 by FDA under an Emergency Use Authorization (EUA). This EUA will remain in effect (meaning this test can be used) for the duration of the COVID-19 declaration under Section 564(b)(1) of the Act, 21 U.S.C. section 360bbb-3(b)(1), unless the authorization is terminated or revoked.  Performed at [facility], [address] [place], [place] [address]    Time Coordinating Discharge: 35 minutes  SIGNED:  Kerney Elbe, DO [facility] 08/21/2020, 12:30 PM Pager is on AMION  If 7PM-7AM, please contact night-coverage [email or web address]

## 2020-08-22 ENCOUNTER — Emergency Department (HOSPITAL_COMMUNITY)
Admission: EM | Admit: 2020-08-22 | Discharge: 2020-08-22 | Disposition: A | Discharge status: Home / Self Care | Payer: Medicaid Other | Attending: Emergency Medicine | Admitting: Emergency Medicine

## 2020-08-22 ENCOUNTER — Telehealth: Payer: Self-pay | Admitting: *Deleted

## 2020-08-22 ENCOUNTER — Other Ambulatory Visit: Payer: Self-pay

## 2020-08-22 ENCOUNTER — Encounter (HOSPITAL_COMMUNITY): Payer: Self-pay | Admitting: Emergency Medicine

## 2020-08-22 DIAGNOSIS — Z79899 Other long term (current) drug therapy: Secondary | ICD-10-CM | POA: Insufficient documentation

## 2020-08-22 DIAGNOSIS — Z992 Dependence on renal dialysis: Secondary | ICD-10-CM | POA: Insufficient documentation

## 2020-08-22 DIAGNOSIS — E1159 Type 2 diabetes mellitus with other circulatory complications: Secondary | ICD-10-CM | POA: Insufficient documentation

## 2020-08-22 DIAGNOSIS — I5042 Chronic combined systolic (congestive) and diastolic (congestive) heart failure: Secondary | ICD-10-CM | POA: Insufficient documentation

## 2020-08-22 DIAGNOSIS — R1084 Generalized abdominal pain: Secondary | ICD-10-CM | POA: Diagnosis present

## 2020-08-22 DIAGNOSIS — E1165 Type 2 diabetes mellitus with hyperglycemia: Secondary | ICD-10-CM | POA: Insufficient documentation

## 2020-08-22 DIAGNOSIS — E1140 Type 2 diabetes mellitus with diabetic neuropathy, unspecified: Secondary | ICD-10-CM | POA: Diagnosis not present

## 2020-08-22 DIAGNOSIS — F17210 Nicotine dependence, cigarettes, uncomplicated: Secondary | ICD-10-CM | POA: Diagnosis not present

## 2020-08-22 DIAGNOSIS — K922 Gastrointestinal hemorrhage, unspecified: Secondary | ICD-10-CM | POA: Insufficient documentation

## 2020-08-22 DIAGNOSIS — I132 Hypertensive heart and chronic kidney disease with heart failure and with stage 5 chronic kidney disease, or end stage renal disease: Secondary | ICD-10-CM | POA: Insufficient documentation

## 2020-08-22 DIAGNOSIS — Z7982 Long term (current) use of aspirin: Secondary | ICD-10-CM | POA: Insufficient documentation

## 2020-08-22 DIAGNOSIS — E113399 Type 2 diabetes mellitus with moderate nonproliferative diabetic retinopathy without macular edema, unspecified eye: Secondary | ICD-10-CM | POA: Diagnosis not present

## 2020-08-22 DIAGNOSIS — N186 End stage renal disease: Secondary | ICD-10-CM | POA: Diagnosis not present

## 2020-08-22 DIAGNOSIS — Z794 Long term (current) use of insulin: Secondary | ICD-10-CM | POA: Insufficient documentation

## 2020-08-22 LAB — RAPID URINE DRUG SCREEN, HOSP PERFORMED
Amphetamines: NOT DETECTED
Barbiturates: NOT DETECTED
Benzodiazepines: NOT DETECTED
Cocaine: POSITIVE — AB
Opiates: NOT DETECTED
Tetrahydrocannabinol: NOT DETECTED

## 2020-08-22 LAB — CBC
HCT: 35 % — ABNORMAL LOW (ref 39.0–52.0)
Hemoglobin: 10.7 g/dL — ABNORMAL LOW (ref 13.0–17.0)
MCH: 28.9 pg (ref 26.0–34.0)
MCHC: 30.6 g/dL (ref 30.0–36.0)
MCV: 94.6 fL (ref 80.0–100.0)
Platelets: 241 10*3/uL (ref 150–400)
RBC: 3.7 MIL/uL — ABNORMAL LOW (ref 4.22–5.81)
RDW: 17.9 % — ABNORMAL HIGH (ref 11.5–15.5)
WBC: 7.6 10*3/uL (ref 4.0–10.5)
nRBC: 0 % (ref 0.0–0.2)

## 2020-08-22 LAB — BASIC METABOLIC PANEL
Anion gap: 10 (ref 5–15)
BUN: 56 mg/dL — ABNORMAL HIGH (ref 6–20)
CO2: 24 mmol/L (ref 22–32)
Calcium: 8.8 mg/dL — ABNORMAL LOW (ref 8.9–10.3)
Chloride: 101 mmol/L (ref 98–111)
Creatinine, Ser: 4.79 mg/dL — ABNORMAL HIGH (ref 0.61–1.24)
GFR, Estimated: 13 mL/min — ABNORMAL LOW (ref >60)
Glucose, Bld: 168 mg/dL — ABNORMAL HIGH (ref 70–99)
Potassium: 4.6 mmol/L (ref 3.5–5.1)
Sodium: 135 mmol/L (ref 135–145)

## 2020-08-22 LAB — URINALYSIS, ROUTINE W REFLEX MICROSCOPIC
Bacteria, UA: NONE SEEN
Bilirubin Urine: NEGATIVE
Glucose, UA: 50 mg/dL — AB
Ketones, ur: NEGATIVE mg/dL
Leukocytes,Ua: NEGATIVE
Nitrite: NEGATIVE
Protein, ur: 100 mg/dL — AB
Specific Gravity, Urine: 1.006 (ref 1.005–1.030)
pH: 6 (ref 5.0–8.0)

## 2020-08-22 MED ORDER — OXYCODONE-ACETAMINOPHEN 5-325 MG PO TABS
1.0000 | ORAL_TABLET | Freq: Once | ORAL | Status: AC
  Administered 2020-08-22: 1 via ORAL
  Filled 2020-08-22: qty 1

## 2020-08-22 NOTE — ED Notes (Signed)
Dc instructions reviewed with pt. Refuses last BP. Refuses to sign e-signature for discharge, states he just wants his paperwork and to leave.

## 2020-08-22 NOTE — Telephone Encounter (Signed)
Called and spoke w/ pt about normal EEG per Dr. Leonie Man. Pt verbalized understanding.

## 2020-08-22 NOTE — Discharge Instructions (Addendum)
As discussed, stop taking your aspirin this week.  Also, do not take any additional pain relievers such as ibuprofen, Aleve, Motrin, or Advil.  Stop using cocaine.  Please keep your dialysis appointment for today.  Call Dr. Colman Cater office to arrange to have an outpatient colonoscopy for later this week.

## 2020-08-22 NOTE — ED Triage Notes (Signed)
Pt c/o rectal bleeding that started on Saturday. Reports that he was scheduled for a colonoscopy today and did not go because he was nervous.

## 2020-08-22 NOTE — Telephone Encounter (Signed)
-----   Message from Wyvonnia Lora, RN sent at 08/22/2020  3:43 PM EDT -----  ----- Message ----- From: Garvin Fila, MD Sent: 08/19/2020  10:14 AM EDT To: Baldomero Lamy, RN  Kindly inform the patient that EEG study was normal
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
## 2020-08-22 NOTE — ED Notes (Signed)
Patient states he was just discharged yesterday from hospital for same concern of rectal bleeding and was scheduled for a f/u today with GI for a colonoscopy. At first, pt reports to nurse that he went to his appt but they told him his name was not in the computer and they told him he had to come back to the ED for a referral. After a few minutes patient then states that he was not telling the truth and that he chose to skip the appt because he "did'nt want anyone sticking anything up my butt". Additionally, he reports 10/10 abdominal pain with cramping, some distention noted. Pt is alert and oriented x4, able to transfer safely into bed from motorized scooter. Pt reports dark red blood from rectum, not visualized by nurse at this time.

## 2020-08-26 ENCOUNTER — Encounter (HOSPITAL_COMMUNITY): Payer: Self-pay

## 2020-08-26 ENCOUNTER — Emergency Department (HOSPITAL_COMMUNITY)
Admission: EM | Admit: 2020-08-26 | Discharge: 2020-08-26 | Discharge status: Against Medical Advice | Payer: Medicaid Other | Attending: Emergency Medicine | Admitting: Emergency Medicine

## 2020-08-26 ENCOUNTER — Other Ambulatory Visit: Payer: Self-pay

## 2020-08-26 DIAGNOSIS — Z79899 Other long term (current) drug therapy: Secondary | ICD-10-CM | POA: Insufficient documentation

## 2020-08-26 DIAGNOSIS — E1122 Type 2 diabetes mellitus with diabetic chronic kidney disease: Secondary | ICD-10-CM | POA: Insufficient documentation

## 2020-08-26 DIAGNOSIS — F17210 Nicotine dependence, cigarettes, uncomplicated: Secondary | ICD-10-CM | POA: Diagnosis not present

## 2020-08-26 DIAGNOSIS — I5042 Chronic combined systolic (congestive) and diastolic (congestive) heart failure: Secondary | ICD-10-CM | POA: Insufficient documentation

## 2020-08-26 DIAGNOSIS — I132 Hypertensive heart and chronic kidney disease with heart failure and with stage 5 chronic kidney disease, or end stage renal disease: Secondary | ICD-10-CM | POA: Insufficient documentation

## 2020-08-26 DIAGNOSIS — Z992 Dependence on renal dialysis: Secondary | ICD-10-CM | POA: Diagnosis not present

## 2020-08-26 DIAGNOSIS — N186 End stage renal disease: Secondary | ICD-10-CM | POA: Diagnosis not present

## 2020-08-26 DIAGNOSIS — E1140 Type 2 diabetes mellitus with diabetic neuropathy, unspecified: Secondary | ICD-10-CM | POA: Diagnosis not present

## 2020-08-26 DIAGNOSIS — M79601 Pain in right arm: Secondary | ICD-10-CM | POA: Diagnosis not present

## 2020-08-26 DIAGNOSIS — Z794 Long term (current) use of insulin: Secondary | ICD-10-CM | POA: Diagnosis not present

## 2020-08-26 MED ORDER — OXYCODONE-ACETAMINOPHEN 5-325 MG PO TABS
1.0000 | ORAL_TABLET | Freq: Once | ORAL | Status: AC
  Administered 2020-08-26: 1 via ORAL
  Filled 2020-08-26: qty 1
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
## 2020-08-26 NOTE — ED Notes (Signed)
Pt given his pain medication and then seen rolling down the hall leaving. EDP made aware.

## 2020-08-26 NOTE — ED Triage Notes (Signed)
Pt has had rectal bleeding for a while, pt was admitted and left AMA. Pt says it has gotten worse.

## 2020-08-29 ENCOUNTER — Encounter: Payer: Medicaid Other | Admitting: Vascular Surgery

## 2020-09-15 ENCOUNTER — Other Ambulatory Visit: Payer: Self-pay

## 2020-09-15 ENCOUNTER — Emergency Department (HOSPITAL_COMMUNITY)
Admission: EM | Admit: 2020-09-15 | Discharge: 2020-09-15 | Disposition: A | Discharge status: Home / Self Care | Payer: Medicaid Other | Attending: Emergency Medicine | Admitting: Emergency Medicine

## 2020-09-15 ENCOUNTER — Encounter (HOSPITAL_COMMUNITY): Payer: Self-pay | Admitting: Emergency Medicine

## 2020-09-15 ENCOUNTER — Emergency Department (HOSPITAL_COMMUNITY): Payer: Medicaid Other

## 2020-09-15 DIAGNOSIS — R319 Hematuria, unspecified: Secondary | ICD-10-CM | POA: Diagnosis present

## 2020-09-15 DIAGNOSIS — E1122 Type 2 diabetes mellitus with diabetic chronic kidney disease: Secondary | ICD-10-CM | POA: Insufficient documentation

## 2020-09-15 DIAGNOSIS — Z992 Dependence on renal dialysis: Secondary | ICD-10-CM | POA: Insufficient documentation

## 2020-09-15 DIAGNOSIS — K922 Gastrointestinal hemorrhage, unspecified: Secondary | ICD-10-CM | POA: Diagnosis not present

## 2020-09-15 DIAGNOSIS — N186 End stage renal disease: Secondary | ICD-10-CM | POA: Diagnosis not present

## 2020-09-15 DIAGNOSIS — I509 Heart failure, unspecified: Secondary | ICD-10-CM | POA: Insufficient documentation

## 2020-09-15 DIAGNOSIS — F17210 Nicotine dependence, cigarettes, uncomplicated: Secondary | ICD-10-CM | POA: Diagnosis not present

## 2020-09-15 LAB — CBC
HCT: 29.2 % — ABNORMAL LOW (ref 39.0–52.0)
Hemoglobin: 9.3 g/dL — ABNORMAL LOW (ref 13.0–17.0)
MCH: 30.1 pg (ref 26.0–34.0)
MCHC: 31.8 g/dL (ref 30.0–36.0)
MCV: 94.5 fL (ref 80.0–100.0)
Platelets: 297 10*3/uL (ref 150–400)
RBC: 3.09 MIL/uL — ABNORMAL LOW (ref 4.22–5.81)
RDW: 16.8 % — ABNORMAL HIGH (ref 11.5–15.5)
WBC: 7.4 10*3/uL (ref 4.0–10.5)
nRBC: 0 % (ref 0.0–0.2)

## 2020-09-15 LAB — COMPREHENSIVE METABOLIC PANEL
ALT: 11 U/L (ref 0–44)
AST: 9 U/L — ABNORMAL LOW (ref 15–41)
Albumin: 3.3 g/dL — ABNORMAL LOW (ref 3.5–5.0)
Alkaline Phosphatase: 121 U/L (ref 38–126)
Anion gap: 10 (ref 5–15)
BUN: 48 mg/dL — ABNORMAL HIGH (ref 6–20)
CO2: 24 mmol/L (ref 22–32)
Calcium: 7.6 mg/dL — ABNORMAL LOW (ref 8.9–10.3)
Chloride: 104 mmol/L (ref 98–111)
Creatinine, Ser: 4.4 mg/dL — ABNORMAL HIGH (ref 0.61–1.24)
GFR, Estimated: 15 mL/min — ABNORMAL LOW (ref >60)
Glucose, Bld: 106 mg/dL — ABNORMAL HIGH (ref 70–99)
Potassium: 4.2 mmol/L (ref 3.5–5.1)
Sodium: 138 mmol/L (ref 135–145)
Total Bilirubin: 0.6 mg/dL (ref 0.3–1.2)
Total Protein: 7.7 g/dL (ref 6.5–8.1)

## 2020-09-15 MED ORDER — OXYCODONE-ACETAMINOPHEN 5-325 MG PO TABS
1.0000 | ORAL_TABLET | Freq: Once | ORAL | Status: AC
  Administered 2020-09-15: 1 via ORAL
  Filled 2020-09-15: qty 1

## 2020-09-15 NOTE — ED Notes (Signed)
ED Provider at bedside. 

## 2020-09-15 NOTE — ED Provider Notes (Signed)
[facility] Emergency Department Provider Note MRN:  [id number]  Arrival date & time: 09/15/20     Chief Complaint   Bleeding History of Present Illness   Zachary Young is a 60 y.o. year-old male with a history of ESRD, diabetes, CHF presenting to the ED with chief complaint of bleeding.  Patient is endorsing blood coming from his mouth and nose, also endorsing blood in his stool, bloody diarrhea.  He is unsure if he is coughing up the blood or vomiting blood.  Endorsing abdominal pain, pain to the right arm which is been present for 4 months.  Denies chest pain or shortness of breath.  Symptoms constant, moderate, no exacerbating or alleviating factors.  Review of Systems  A complete 10 system review of systems was obtained and all systems are negative except as noted in the HPI and PMH.   Patient's Health History    Past Medical History:  Diagnosis Date  . CHF (congestive heart failure) ([place])   . Diabetes mellitus without complication ([place])   . Dialysis patient ([place])   . Peripheral edema   . Renal disorder    kidney disease    Past Surgical History:  Procedure Laterality Date  . AMPUTATION Right 10/21/2019   Procedure: RIGHT BELOW KNEE AMPUTATION;  Surgeon: Newt Minion, MD;  Location: [place];  Service: Orthopedics;  Laterality: Right;  . AV FISTULA PLACEMENT Left 11/13/2019   Procedure: LEFT ARTERIOVENOUS (AV) FISTULA VERSUS ARTERIOVENOUS GRAFT;  Surgeon: Rosetta Posner, MD;  Location: [facility] OR;  Service: Vascular;  Laterality: Left;  . AV FISTULA PLACEMENT Left 04/05/2020   Procedure: INSERTION OF LEFT ARM ARTERIOVENOUS (AV) GORE-TEX GRAFT;  Surgeon: Rosetta Posner, MD;  [facility];  Service: Vascular;  Laterality: Left;  . AV FISTULA PLACEMENT Right 07/14/2020   Procedure: RIGHT ARM ARTERIOVENOUS (AV) GRAFT CREATION;  Surgeon: Rosetta Posner, MD;  [facility];  Service: Vascular;  Laterality: Right;  . BASCILIC VEIN TRANSPOSITION Left 01/27/2020    Procedure: LEFT ARM 2ND STAGE BASCILIC VEIN TRANSPOSITION;  Surgeon: Rosetta Posner, MD;  Location: [place];  Service: Vascular;  Laterality: Left;  . DIALYSIS/PERMA CATHETER INSERTION N/A 03/03/2020   Procedure: DIALYSIS/PERMA CATHETER INSERTION;  Surgeon: Algernon Huxley, MD;  Location: [place] CV LAB;  Service: Cardiovascular;  Laterality: N/A;  . ESOPHAGOGASTRODUODENOSCOPY (EGD) WITH PROPOFOL N/A 06/17/2020   gastritis, normal duodenum.   . INSERTION OF DIALYSIS CATHETER N/A 04/05/2020   Procedure: INSERTION OF TUNNEL  DIALYSIS CATHETER LEFT INTERNAL JUGULAR;  Surgeon: Rosetta Posner, MD;  Location: [place];  Service: Vascular;  Laterality: N/A;  . IR FLUORO GUIDE CV LINE RIGHT  11/05/2019  . IR FLUORO GUIDE CV LINE RIGHT  06/07/2020  . IR REMOVAL TUN CV CATH W/O FL  06/01/2020  . IR THROMBECTOMY AV FISTULA W/THROMBOLYSIS/PTA INC/SHUNT/IMG LEFT Left 06/06/2020  . IR US GUIDE VASC ACCESS LEFT  06/06/2020  . IR US GUIDE VASC ACCESS RIGHT  11/05/2019  . IR US GUIDE VASC ACCESS RIGHT  06/07/2020  . REMOVAL OF A DIALYSIS CATHETER Right 04/05/2020   Procedure: REMOVAL OF RIGHT CHEST DIALYSIS CATHETER;  Surgeon: Rosetta Posner, MD;  Location: [place];  Service: Vascular;  Laterality: Right;  . TOE AMPUTATION Bilateral    due to osteomyelitis, all 5 each foot    Family History  Problem Relation Age of Onset  . Cancer Mother        uknown type of cancer  .  Colon cancer Neg Hx   . Colon polyps Neg Hx     Social History   Socioeconomic History  . Marital status: Single    Spouse name: Not on file  . Number of children: Not on file  . Years of education: Not on file  . Highest education level: Not on file  Occupational History  . Not on file  Tobacco Use  . Smoking status: Current Every Day Smoker    Packs/day: 0.50    Types: Cigarettes  . Smokeless tobacco: Never Used  Vaping Use  . Vaping Use: Never used  Substance and Sexual Activity  . Alcohol use: Not Currently  . Drug use: Not  Currently    Types: Cocaine    Comment: last cocaine use a few months ago; denied 08/02/20  . Sexual activity: Not Currently  Other Topics Concern  . Not on file  Social History Narrative   Lives with friend   Right Handed   Drinks no caffeine   Social Determinants of Health   Financial Resource Strain: Not on file  Food Insecurity: Not on file  Transportation Needs: Not on file  Physical Activity: Not on file  Stress: Not on file  Social Connections: Not on file  Intimate Partner Violence: Not on file     Physical Exam   Vitals:   09/15/20 0415 09/15/20 0515  BP: 107/75 119/79  Pulse: 98   Resp: 16   SpO2: 97%     CONSTITUTIONAL: Chronically ill-appearing, NAD NEURO:  Alert and oriented x 3, no focal deficits EYES:  eyes equal and reactive ENT/NECK:  no LAD, no JVD CARDIO: Regular rate, well-perfused, normal S1 and S2 PULM:  CTAB no wheezing or rhonchi GI/GU:  normal bowel sounds, non-distended, non-tender MSK/SPINE: Mild swelling and tenderness to the right forearm SKIN:  no rash, atraumatic PSYCH:  Appropriate speech and behavior  *Additional and/or pertinent findings included in MDM below  Diagnostic and Interventional Summary    EKG Interpretation  Date/Time:    Ventricular Rate:    PR Interval:    QRS Duration:   QT Interval:    QTC Calculation:   R Axis:     Text Interpretation:        Labs Reviewed  CBC - Abnormal; Notable for the following components:      Result Value   RBC 3.09 (*)    Hemoglobin 9.3 (*)    HCT 29.2 (*)    RDW 16.8 (*)    All other components within normal limits  COMPREHENSIVE METABOLIC PANEL - Abnormal; Notable for the following components:   Glucose, Bld 106 (*)    BUN 48 (*)    Creatinine, Ser 4.40 (*)    Calcium 7.6 (*)    Albumin 3.3 (*)    AST 9 (*)    GFR, Estimated 15 (*)    All other components within normal limits    DG Chest Port 1 View  Final Result      Medications  oxyCODONE-acetaminophen  (PERCOCET/ROXICET) 5-325 MG per tablet 1 tablet (1 tablet Oral Given 09/15/20 0159)     Procedures  /  Critical Care Procedures  ED Course and Medical Decision Making  I have reviewed the triage vital signs, the nursing notes, and pertinent available records from the EMR.  Listed above are laboratory and imaging tests that I personally ordered, reviewed, and interpreted and then considered in my medical decision making (see below for details).  Patient recently admitted to  the hospital for GI bleeding less than a month ago, was evaluated by gastroenterology and the plan was to pursue colonoscopy but then patient left AGAINST MEDICAL ADVICE.  I suspect patient is continuing to have GI bleeding.  He had similar abdominal pain during last admission with CT imaging that was without acute process.  Abdomen is overall soft without rebound guarding or rigidity at this time.  Will obtain screening labs, provide pain control, assess his H&H, monitor closely.  Currently he is hemodynamically stable.     H&H is a bit lower than prior but at or near his baseline.  Patient continues to have normal vital signs here in the emergency department, no bleeding of any kind during a 5-hour observation.  He was instructed to call and set up the outpatient colonoscopy but he has not done so.  I encouraged him to do this, strict return precautions.  Barth Kirks. [place]onia Small, [place] [email or web address]  Final Clinical Impressions(s) / ED Diagnoses     ICD-10-CM   1. Gastrointestinal hemorrhage, unspecified gastrointestinal hemorrhage type  K92.2     ED Discharge Orders    None       Discharge Instructions Discussed with and Provided to Patient:     Discharge Instructions     You were evaluated in the Emergency Department and after careful evaluation, we did not find any emergent condition requiring admission or further testing in the hospital.  Your  exam/testing today was overall reassuring.  Please set up the outpatient colonoscopy as we discussed.  Please return to the Emergency Department if you experience any worsening of your condition.  Thank you for allowing [place] to be a part of your care.        Maudie Flakes, MD 09/15/20 [phone]

## 2020-09-15 NOTE — ED Triage Notes (Signed)
Pt bleeding from bilateral nares since yesterday.

## 2020-09-15 NOTE — Discharge Instructions (Addendum)
You were evaluated in the Emergency Department and after careful evaluation, we did not find any emergent condition requiring admission or further testing in the hospital.  Your exam/testing today was overall reassuring.  Please set up the outpatient colonoscopy as we discussed.  Please return to the Emergency Department if you experience any worsening of your condition.  Thank you for allowing [place] to be a part of your care.

## 2020-09-19 ENCOUNTER — Encounter: Payer: Medicaid Other | Admitting: Vascular Surgery

## 2020-09-23 ENCOUNTER — Other Ambulatory Visit: Payer: Self-pay

## 2020-09-23 ENCOUNTER — Emergency Department (HOSPITAL_COMMUNITY)
Admission: EM | Admit: 2020-09-23 | Discharge: 2020-09-23 | Discharge status: Against Medical Advice | Payer: Medicaid Other

## 2020-09-24 ENCOUNTER — Encounter (HOSPITAL_COMMUNITY): Payer: Self-pay | Admitting: Emergency Medicine

## 2020-09-24 ENCOUNTER — Emergency Department (HOSPITAL_COMMUNITY)
Admission: EM | Admit: 2020-09-24 | Discharge: 2020-09-24 | Disposition: A | Discharge status: Home / Self Care | Payer: Medicaid Other | Attending: Emergency Medicine | Admitting: Emergency Medicine

## 2020-09-24 ENCOUNTER — Other Ambulatory Visit: Payer: Self-pay

## 2020-09-24 DIAGNOSIS — Z5321 Procedure and treatment not carried out due to patient leaving prior to being seen by health care provider: Secondary | ICD-10-CM | POA: Diagnosis not present

## 2020-09-24 DIAGNOSIS — K920 Hematemesis: Secondary | ICD-10-CM | POA: Diagnosis not present

## 2020-09-24 DIAGNOSIS — R1084 Generalized abdominal pain: Secondary | ICD-10-CM | POA: Diagnosis not present

## 2020-09-24 DIAGNOSIS — R112 Nausea with vomiting, unspecified: Secondary | ICD-10-CM | POA: Diagnosis present

## 2020-09-24 NOTE — ED Provider Notes (Addendum)
Emergency Medicine Provider Triage Evaluation Note  Zachary Young , a 60 y.o. male  was evaluated in triage.  Pt complains of nausea and vomiting since last night with bloody emesis as well as "blood coming from my nose".  Denies any black or bloody stools.  Some diffuse abdominal pain.  No current blood thinner use..  Review of Systems  Positive: Abdominal pain, nausea, vomiting, bloody emesis right arm swelling Negative: Chest pain or shortness of breath  Physical Exam  BP (!) 150/76 (BP Location: Left Arm)   Pulse 84   Temp 98 F (36.7 C) (Oral)   Resp 16   Ht '6\' 3"'$  (1.905 m)   Wt (!) 141 kg   SpO2 99%   BMI 38.85 kg/m  Gen:   Awake, no distress   Resp:  Normal effort  MSK:   Moves extremities without difficulty  Other:  Right arm diffusely swollen, intact radial pulse.  Graft present with thrill Patient states he is no longer on dialysis.  Medical Decision Making  Medically screening exam initiated at 6:37 AM.  Appropriate orders placed.  Zachary Young was informed that the remainder of the evaluation will be completed by another provider, this initial triage assessment does not replace that evaluation, and the importance of remaining in the ED until their evaluation is complete.  Reported hematemesis since last night.  EGD in March showed gastritis.  Was scheduled for colonoscopy in May but left AMA.   Zachary Essex, MD  09/24/20 [phone]  Addendum 7:15 AM.  Nurse attempted to draw blood work and patient is refusing.  [facility]  Patient informed that he will be leaving [name] as his evaluation is not complete.  Vomiting blood can be life-threatening. Patient appears to have capacity to make this decision and will leave [place].   Zachary Essex, MD 09/24/20 [phone]

## 2020-09-24 NOTE — ED Triage Notes (Signed)
Pt states he has been vomiting blood and having blood come from mouth and nose " at same time" and has swelling to R arm x 4 months.

## 2020-09-24 NOTE — ED Notes (Signed)
Assessed patient and was to draw blood and start IV but patient now requesting to leave and go to [facility], where his PCP is. Dr Wyvonnia Dusky made aware and going to patient's room to talk to patient about importance in staying for treatment and  risks of leaving without treatment.

## 2020-09-24 NOTE — ED Notes (Signed)
Patient still wishing to leave AMA.

## 2020-09-25 ENCOUNTER — Emergency Department (HOSPITAL_COMMUNITY)
Admission: EM | Admit: 2020-09-25 | Discharge: 2020-09-25 | Discharge status: Against Medical Advice | Payer: Medicaid Other

## 2020-09-25 ENCOUNTER — Other Ambulatory Visit: Payer: Self-pay

## 2020-09-25 NOTE — ED Notes (Signed)
Pt reported to Screener in Waiting room he was leaving.

## 2020-09-26 ENCOUNTER — Encounter (HOSPITAL_COMMUNITY): Payer: Self-pay | Admitting: Emergency Medicine

## 2020-09-26 ENCOUNTER — Emergency Department (HOSPITAL_COMMUNITY)
Admission: EM | Admit: 2020-09-26 | Discharge: 2020-09-26 | Disposition: A | Discharge status: Home / Self Care | Payer: Medicaid Other | Source: Home / Self Care | Attending: Emergency Medicine | Admitting: Emergency Medicine

## 2020-09-26 ENCOUNTER — Inpatient Hospital Stay (HOSPITAL_COMMUNITY)
Admission: EM | Admit: 2020-09-26 | Discharge: 2020-09-28 | DRG: 640 | Discharge status: Against Medical Advice | Payer: Medicaid Other | Attending: Family Medicine | Admitting: Family Medicine

## 2020-09-26 ENCOUNTER — Emergency Department (HOSPITAL_COMMUNITY): Payer: Medicaid Other

## 2020-09-26 ENCOUNTER — Encounter (HOSPITAL_COMMUNITY): Payer: Self-pay | Admitting: Family Medicine

## 2020-09-26 ENCOUNTER — Other Ambulatory Visit: Payer: Self-pay

## 2020-09-26 DIAGNOSIS — U071 COVID-19: Secondary | ICD-10-CM | POA: Diagnosis present

## 2020-09-26 DIAGNOSIS — F17200 Nicotine dependence, unspecified, uncomplicated: Secondary | ICD-10-CM | POA: Diagnosis present

## 2020-09-26 DIAGNOSIS — E872 Acidosis, unspecified: Secondary | ICD-10-CM | POA: Diagnosis present

## 2020-09-26 DIAGNOSIS — Z89511 Acquired absence of right leg below knee: Secondary | ICD-10-CM

## 2020-09-26 DIAGNOSIS — Z9119 Patient's noncompliance with other medical treatment and regimen: Secondary | ICD-10-CM

## 2020-09-26 DIAGNOSIS — I5042 Chronic combined systolic (congestive) and diastolic (congestive) heart failure: Secondary | ICD-10-CM | POA: Insufficient documentation

## 2020-09-26 DIAGNOSIS — Z91012 Allergy to eggs: Secondary | ICD-10-CM

## 2020-09-26 DIAGNOSIS — E8770 Fluid overload, unspecified: Principal | ICD-10-CM | POA: Diagnosis present

## 2020-09-26 DIAGNOSIS — Z992 Dependence on renal dialysis: Secondary | ICD-10-CM

## 2020-09-26 DIAGNOSIS — Z9115 Patient's noncompliance with renal dialysis: Secondary | ICD-10-CM

## 2020-09-26 DIAGNOSIS — E1129 Type 2 diabetes mellitus with other diabetic kidney complication: Secondary | ICD-10-CM | POA: Insufficient documentation

## 2020-09-26 DIAGNOSIS — E785 Hyperlipidemia, unspecified: Secondary | ICD-10-CM | POA: Diagnosis present

## 2020-09-26 DIAGNOSIS — G546 Phantom limb syndrome with pain: Secondary | ICD-10-CM | POA: Diagnosis present

## 2020-09-26 DIAGNOSIS — I132 Hypertensive heart and chronic kidney disease with heart failure and with stage 5 chronic kidney disease, or end stage renal disease: Secondary | ICD-10-CM | POA: Insufficient documentation

## 2020-09-26 DIAGNOSIS — Z79899 Other long term (current) drug therapy: Secondary | ICD-10-CM

## 2020-09-26 DIAGNOSIS — R2231 Localized swelling, mass and lump, right upper limb: Secondary | ICD-10-CM | POA: Insufficient documentation

## 2020-09-26 DIAGNOSIS — N186 End stage renal disease: Secondary | ICD-10-CM | POA: Diagnosis present

## 2020-09-26 DIAGNOSIS — I953 Hypotension of hemodialysis: Secondary | ICD-10-CM | POA: Diagnosis present

## 2020-09-26 DIAGNOSIS — Z89429 Acquired absence of other toe(s), unspecified side: Secondary | ICD-10-CM

## 2020-09-26 DIAGNOSIS — I5082 Biventricular heart failure: Secondary | ICD-10-CM | POA: Diagnosis present

## 2020-09-26 DIAGNOSIS — F1120 Opioid dependence, uncomplicated: Secondary | ICD-10-CM | POA: Diagnosis present

## 2020-09-26 DIAGNOSIS — E1140 Type 2 diabetes mellitus with diabetic neuropathy, unspecified: Secondary | ICD-10-CM | POA: Diagnosis present

## 2020-09-26 DIAGNOSIS — E1165 Type 2 diabetes mellitus with hyperglycemia: Secondary | ICD-10-CM

## 2020-09-26 DIAGNOSIS — Z91030 Bee allergy status: Secondary | ICD-10-CM

## 2020-09-26 DIAGNOSIS — E1122 Type 2 diabetes mellitus with diabetic chronic kidney disease: Secondary | ICD-10-CM | POA: Insufficient documentation

## 2020-09-26 DIAGNOSIS — D631 Anemia in chronic kidney disease: Secondary | ICD-10-CM | POA: Diagnosis present

## 2020-09-26 DIAGNOSIS — Z8739 Personal history of other diseases of the musculoskeletal system and connective tissue: Secondary | ICD-10-CM

## 2020-09-26 DIAGNOSIS — E113399 Type 2 diabetes mellitus with moderate nonproliferative diabetic retinopathy without macular edema, unspecified eye: Secondary | ICD-10-CM | POA: Diagnosis present

## 2020-09-26 DIAGNOSIS — E8809 Other disorders of plasma-protein metabolism, not elsewhere classified: Secondary | ICD-10-CM | POA: Diagnosis present

## 2020-09-26 DIAGNOSIS — E1159 Type 2 diabetes mellitus with other circulatory complications: Secondary | ICD-10-CM | POA: Insufficient documentation

## 2020-09-26 DIAGNOSIS — Z91199 Patient's noncompliance with other medical treatment and regimen due to unspecified reason: Secondary | ICD-10-CM

## 2020-09-26 DIAGNOSIS — I252 Old myocardial infarction: Secondary | ICD-10-CM

## 2020-09-26 DIAGNOSIS — I5043 Acute on chronic combined systolic (congestive) and diastolic (congestive) heart failure: Secondary | ICD-10-CM | POA: Diagnosis present

## 2020-09-26 DIAGNOSIS — F1410 Cocaine abuse, uncomplicated: Secondary | ICD-10-CM | POA: Diagnosis not present

## 2020-09-26 DIAGNOSIS — R202 Paresthesia of skin: Secondary | ICD-10-CM | POA: Insufficient documentation

## 2020-09-26 DIAGNOSIS — Z89432 Acquired absence of left foot: Secondary | ICD-10-CM

## 2020-09-26 DIAGNOSIS — Z794 Long term (current) use of insulin: Secondary | ICD-10-CM | POA: Insufficient documentation

## 2020-09-26 DIAGNOSIS — Z884 Allergy status to anesthetic agent status: Secondary | ICD-10-CM

## 2020-09-26 DIAGNOSIS — M79601 Pain in right arm: Secondary | ICD-10-CM | POA: Diagnosis present

## 2020-09-26 DIAGNOSIS — M79631 Pain in right forearm: Secondary | ICD-10-CM | POA: Insufficient documentation

## 2020-09-26 DIAGNOSIS — Z87898 Personal history of other specified conditions: Secondary | ICD-10-CM

## 2020-09-26 DIAGNOSIS — I110 Hypertensive heart disease with heart failure: Secondary | ICD-10-CM | POA: Diagnosis present

## 2020-09-26 DIAGNOSIS — F17210 Nicotine dependence, cigarettes, uncomplicated: Secondary | ICD-10-CM | POA: Insufficient documentation

## 2020-09-26 DIAGNOSIS — Z885 Allergy status to narcotic agent status: Secondary | ICD-10-CM

## 2020-09-26 DIAGNOSIS — I10 Essential (primary) hypertension: Secondary | ICD-10-CM | POA: Diagnosis present

## 2020-09-26 DIAGNOSIS — E133399 Other specified diabetes mellitus with moderate nonproliferative diabetic retinopathy without macular edema, unspecified eye: Secondary | ICD-10-CM | POA: Diagnosis present

## 2020-09-26 LAB — BASIC METABOLIC PANEL
Anion gap: 10 (ref 5–15)
BUN: 58 mg/dL — ABNORMAL HIGH (ref 6–20)
CO2: 22 mmol/L (ref 22–32)
Calcium: 7.7 mg/dL — ABNORMAL LOW (ref 8.9–10.3)
Chloride: 104 mmol/L (ref 98–111)
Creatinine, Ser: 5.6 mg/dL — ABNORMAL HIGH (ref 0.61–1.24)
GFR, Estimated: 11 mL/min — ABNORMAL LOW (ref >60)
Glucose, Bld: 162 mg/dL — ABNORMAL HIGH (ref 70–99)
Potassium: 4.4 mmol/L (ref 3.5–5.1)
Sodium: 136 mmol/L (ref 135–145)

## 2020-09-26 LAB — CBC WITH DIFFERENTIAL/PLATELET
Abs Immature Granulocytes: 0.01 10*3/uL (ref 0.00–0.07)
Basophils Absolute: 0 10*3/uL (ref 0.0–0.1)
Basophils Relative: 0 %
Eosinophils Absolute: 0.1 10*3/uL (ref 0.0–0.5)
Eosinophils Relative: 2 %
HCT: 29 % — ABNORMAL LOW (ref 39.0–52.0)
Hemoglobin: 9.1 g/dL — ABNORMAL LOW (ref 13.0–17.0)
Immature Granulocytes: 0 %
Lymphocytes Relative: 22 %
Lymphs Abs: 1.3 10*3/uL (ref 0.7–4.0)
MCH: 29.9 pg (ref 26.0–34.0)
MCHC: 31.4 g/dL (ref 30.0–36.0)
MCV: 95.4 fL (ref 80.0–100.0)
Monocytes Absolute: 0.6 10*3/uL (ref 0.1–1.0)
Monocytes Relative: 10 %
Neutro Abs: 3.9 10*3/uL (ref 1.7–7.7)
Neutrophils Relative %: 66 %
Platelets: 252 10*3/uL (ref 150–400)
RBC: 3.04 MIL/uL — ABNORMAL LOW (ref 4.22–5.81)
RDW: 15.5 % (ref 11.5–15.5)
WBC: 5.9 10*3/uL (ref 4.0–10.5)
nRBC: 0 % (ref 0.0–0.2)

## 2020-09-26 LAB — RESP PANEL BY RT-PCR (FLU A&B, COVID) ARPGX2
Influenza A by PCR: NEGATIVE
Influenza B by PCR: NEGATIVE
SARS Coronavirus 2 by RT PCR: POSITIVE — AB

## 2020-09-26 LAB — GLUCOSE, CAPILLARY
Glucose-Capillary: 91 mg/dL (ref 70–99)
Glucose-Capillary: 110 mg/dL — ABNORMAL HIGH (ref 70–99)

## 2020-09-26 MED ORDER — AMLODIPINE BESYLATE 5 MG PO TABS: 5.0000 mg | ORAL_TABLET | Freq: Every day | ORAL | Status: DC

## 2020-09-26 MED ORDER — ALTEPLASE 2 MG IJ SOLR: 2.0000 mg | Freq: Once | INTRAMUSCULAR | Status: DC | PRN

## 2020-09-26 MED ORDER — HYDROCODONE-ACETAMINOPHEN 5-325 MG PO TABS: 1.0000 | ORAL_TABLET | Freq: Four times a day (QID) | ORAL | Status: DC | PRN

## 2020-09-26 MED ORDER — BEBTELOVIMAB 175 MG/2 ML IV (EUA)
175.0000 mg | Freq: Once | INTRAMUSCULAR | Status: AC
  Administered 2020-09-26: 175 mg via INTRAVENOUS
  Filled 2020-09-26: qty 2

## 2020-09-26 MED ORDER — HEPARIN SODIUM (PORCINE) 5000 UNIT/ML IJ SOLN
5000.0000 [IU] | Freq: Three times a day (TID) | INTRAMUSCULAR | Status: DC
  Administered 2020-09-27 – 2020-09-28 (×3): 5000 [IU] via SUBCUTANEOUS
  Filled 2020-09-26 (×5): qty 1

## 2020-09-26 MED ORDER — VITAMIN D (ERGOCALCIFEROL) 1.25 MG (50000 UNIT) PO CAPS: 50000.0000 [IU] | ORAL_CAPSULE | ORAL | Status: DC

## 2020-09-26 MED ORDER — ATORVASTATIN CALCIUM 20 MG PO TABS
20.0000 mg | ORAL_TABLET | Freq: Every day | ORAL | Status: DC
  Administered 2020-09-27: 20 mg via ORAL
  Filled 2020-09-26 (×2): qty 1

## 2020-09-26 MED ORDER — PHENYTOIN SODIUM EXTENDED 100 MG PO CAPS
300.0000 mg | ORAL_CAPSULE | Freq: Every day | ORAL | Status: DC
  Administered 2020-09-27: 300 mg via ORAL
  Filled 2020-09-26 (×2): qty 3

## 2020-09-26 MED ORDER — BISACODYL 5 MG PO TBEC: 5.0000 mg | DELAYED_RELEASE_TABLET | Freq: Every day | ORAL | Status: DC | PRN

## 2020-09-26 MED ORDER — HEPARIN SODIUM (PORCINE) 1000 UNIT/ML DIALYSIS: 20.0000 [IU]/kg | INTRAMUSCULAR | Status: DC | PRN

## 2020-09-26 MED ORDER — ALBUTEROL SULFATE HFA 108 (90 BASE) MCG/ACT IN AERS: 2.0000 | INHALATION_SPRAY | Freq: Once | RESPIRATORY_TRACT | Status: AC | PRN

## 2020-09-26 MED ORDER — PROCHLORPERAZINE EDISYLATE 10 MG/2ML IJ SOLN: 10.0000 mg | Freq: Four times a day (QID) | INTRAMUSCULAR | Status: DC | PRN

## 2020-09-26 MED ORDER — HEPARIN SODIUM (PORCINE) 1000 UNIT/ML DIALYSIS: 3200.0000 [IU] | INTRAMUSCULAR | Status: DC | PRN

## 2020-09-26 MED ORDER — ACETAMINOPHEN 325 MG PO TABS: 650.0000 mg | ORAL_TABLET | Freq: Four times a day (QID) | ORAL | Status: DC | PRN

## 2020-09-26 MED ORDER — CHLORHEXIDINE GLUCONATE CLOTH 2 % EX PADS
6.0000 | MEDICATED_PAD | Freq: Every day | CUTANEOUS | Status: DC
  Administered 2020-09-28: 6 via TOPICAL

## 2020-09-26 MED ORDER — HEPARIN SODIUM (PORCINE) 1000 UNIT/ML DIALYSIS: 1000.0000 [IU] | INTRAMUSCULAR | Status: DC | PRN

## 2020-09-26 MED ORDER — SODIUM CHLORIDE 0.9 % IV SOLN: 100.0000 mL | INTRAVENOUS | Status: DC | PRN

## 2020-09-26 MED ORDER — NICOTINE 21 MG/24HR TD PT24: 21.0000 mg | MEDICATED_PATCH | Freq: Every day | TRANSDERMAL | Status: DC | PRN

## 2020-09-26 MED ORDER — EPINEPHRINE 0.3 MG/0.3ML IJ SOAJ: 0.3000 mg | Freq: Once | INTRAMUSCULAR | Status: AC | PRN

## 2020-09-26 MED ORDER — FAMOTIDINE IN NACL 20-0.9 MG/50ML-% IV SOLN: 20.0000 mg | Freq: Once | INTRAVENOUS | Status: AC | PRN

## 2020-09-26 MED ORDER — INSULIN GLARGINE 100 UNIT/ML ~~LOC~~ SOLN
15.0000 [IU] | Freq: Every day | SUBCUTANEOUS | Status: DC
  Filled 2020-09-26 (×2): qty 0.15

## 2020-09-26 MED ORDER — SUCRALFATE 1 G PO TABS
1.0000 g | ORAL_TABLET | Freq: Three times a day (TID) | ORAL | Status: DC
  Administered 2020-09-26 – 2020-09-28 (×6): 1 g via ORAL
  Filled 2020-09-26 (×7): qty 1

## 2020-09-26 MED ORDER — PANTOPRAZOLE SODIUM 40 MG PO TBEC
40.0000 mg | DELAYED_RELEASE_TABLET | Freq: Two times a day (BID) | ORAL | Status: DC
  Administered 2020-09-26 – 2020-09-28 (×4): 40 mg via ORAL
  Filled 2020-09-26 (×4): qty 1

## 2020-09-26 MED ORDER — TORSEMIDE 20 MG PO TABS
100.0000 mg | ORAL_TABLET | ORAL | Status: DC
  Administered 2020-09-27: 100 mg via ORAL
  Filled 2020-09-26: qty 5

## 2020-09-26 MED ORDER — DICYCLOMINE HCL 10 MG PO CAPS
20.0000 mg | ORAL_CAPSULE | Freq: Two times a day (BID) | ORAL | Status: DC
  Administered 2020-09-27 – 2020-09-28 (×3): 20 mg via ORAL
  Filled 2020-09-26 (×4): qty 2

## 2020-09-26 MED ORDER — METHYLPREDNISOLONE SODIUM SUCC 125 MG IJ SOLR: 125.0000 mg | Freq: Once | INTRAMUSCULAR | Status: AC | PRN

## 2020-09-26 MED ORDER — OXYCODONE-ACETAMINOPHEN 5-325 MG PO TABS
1.0000 | ORAL_TABLET | ORAL | Status: DC | PRN
  Administered 2020-09-26: 1 via ORAL
  Filled 2020-09-26: qty 1

## 2020-09-26 MED ORDER — ACETAMINOPHEN 650 MG RE SUPP: 650.0000 mg | Freq: Four times a day (QID) | RECTAL | Status: DC | PRN

## 2020-09-26 MED ORDER — INSULIN ASPART 100 UNIT/ML IJ SOLN
0.0000 [IU] | Freq: Three times a day (TID) | INTRAMUSCULAR | Status: DC
Start: 2020-09-26 — End: 2020-09-28

## 2020-09-26 MED ORDER — INSULIN ASPART 100 UNIT/ML ~~LOC~~ SOLN
2.0000 [IU] | Freq: Three times a day (TID) | SUBCUTANEOUS | Status: DC
  Administered 2020-09-26 – 2020-09-28 (×2): 2 [IU] via SUBCUTANEOUS

## 2020-09-26 MED ORDER — OXYCODONE-ACETAMINOPHEN 5-325 MG PO TABS
1.0000 | ORAL_TABLET | Freq: Four times a day (QID) | ORAL | Status: DC | PRN
Start: 2020-09-26 — End: 2020-09-28
  Administered 2020-09-26 – 2020-09-28 (×7): 1 via ORAL
  Filled 2020-09-26 (×7): qty 1

## 2020-09-26 MED ORDER — DIPHENHYDRAMINE HCL 50 MG/ML IJ SOLN: 50.0000 mg | Freq: Once | INTRAMUSCULAR | Status: AC | PRN

## 2020-09-26 MED ORDER — SODIUM CHLORIDE 0.9 % IV SOLN: INTRAVENOUS | Status: AC | PRN

## 2020-09-26 NOTE — ED Notes (Signed)
Pt requesting to have percocet ordered instead of hydrocodone, Izora Gala, RN aware, pts motorized wheelchair charger, cell phone, and belongings sent with him to the floor
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
## 2020-09-26 NOTE — Progress Notes (Signed)
Had no adverse reaction to bebtelovimab .  Sitting on side of bed now eating supper.  Vitals and blood glucose good.

## 2020-09-26 NOTE — Progress Notes (Signed)
TOC consulted due to pt stating that he was dismissed from dialysis. CSW confirmed with [facility] that pt was involuntarily discharged from their facility. CSW updated ED staff. TOC to follow.

## 2020-09-26 NOTE — ED Notes (Signed)
Izora Gala, RN aware of need to monitor for reaction to [facility] for the next hour

## 2020-09-26 NOTE — Progress Notes (Signed)
Called by the ED staff regarding this patient who was involuntarily discharged from his dialysis unit last week.  Attempts have been made to relocate him, however they have not been successful.  Will plan for HD today and discuss with hospitalist service about admission and attempts to find outpatient HD vs just intermittent HD via ED visits. Given his behavioral issues, quick placement is unlikely.

## 2020-09-26 NOTE — ED Notes (Signed)
Pt just seen this morning for the same complaint and had to leave to go to dialysis.
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
## 2020-09-26 NOTE — ED Notes (Signed)
Unable to obtain IV access X 2.  
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
## 2020-09-26 NOTE — ED Notes (Signed)
Date and time results received: 09/26/20 1500  Test: Covid Critical Value: Positive   Name of Provider Notified: Wynetta Emery, MD

## 2020-09-26 NOTE — ED Notes (Signed)
Pt encouraged to keep BP cuff on his arm

## 2020-09-26 NOTE — ED Triage Notes (Signed)
Right arm pain for 4 months.

## 2020-09-26 NOTE — Discharge Instructions (Addendum)
If you develop new or worsening pain in your right arm, chest pain, trouble breathing, or any other new/concerning symptoms then return to the ER for evaluation.
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
## 2020-09-26 NOTE — ED Triage Notes (Signed)
Pt states had dialysis fistula in right arm in March 2022. Pt reports increased swelling and pain since that time. Pt also states that he needs dialysis(MWF) since he was "dismissed from the facility." Last dialysis on Wednesday

## 2020-09-27 DIAGNOSIS — E872 Acidosis: Secondary | ICD-10-CM | POA: Diagnosis present

## 2020-09-27 DIAGNOSIS — U071 COVID-19: Secondary | ICD-10-CM | POA: Diagnosis present

## 2020-09-27 DIAGNOSIS — E113399 Type 2 diabetes mellitus with moderate nonproliferative diabetic retinopathy without macular edema, unspecified eye: Secondary | ICD-10-CM | POA: Diagnosis present

## 2020-09-27 DIAGNOSIS — F1410 Cocaine abuse, uncomplicated: Secondary | ICD-10-CM | POA: Diagnosis present

## 2020-09-27 DIAGNOSIS — E8770 Fluid overload, unspecified: Secondary | ICD-10-CM | POA: Diagnosis present

## 2020-09-27 DIAGNOSIS — N186 End stage renal disease: Secondary | ICD-10-CM

## 2020-09-27 DIAGNOSIS — Z992 Dependence on renal dialysis: Secondary | ICD-10-CM | POA: Diagnosis not present

## 2020-09-27 DIAGNOSIS — E1140 Type 2 diabetes mellitus with diabetic neuropathy, unspecified: Secondary | ICD-10-CM | POA: Diagnosis present

## 2020-09-27 DIAGNOSIS — E1159 Type 2 diabetes mellitus with other circulatory complications: Secondary | ICD-10-CM | POA: Diagnosis present

## 2020-09-27 DIAGNOSIS — I5043 Acute on chronic combined systolic (congestive) and diastolic (congestive) heart failure: Secondary | ICD-10-CM | POA: Diagnosis present

## 2020-09-27 DIAGNOSIS — Z8739 Personal history of other diseases of the musculoskeletal system and connective tissue: Secondary | ICD-10-CM

## 2020-09-27 DIAGNOSIS — F1120 Opioid dependence, uncomplicated: Secondary | ICD-10-CM | POA: Diagnosis present

## 2020-09-27 DIAGNOSIS — I10 Essential (primary) hypertension: Secondary | ICD-10-CM

## 2020-09-27 DIAGNOSIS — G546 Phantom limb syndrome with pain: Secondary | ICD-10-CM | POA: Diagnosis present

## 2020-09-27 DIAGNOSIS — E8809 Other disorders of plasma-protein metabolism, not elsewhere classified: Secondary | ICD-10-CM | POA: Diagnosis present

## 2020-09-27 DIAGNOSIS — I132 Hypertensive heart and chronic kidney disease with heart failure and with stage 5 chronic kidney disease, or end stage renal disease: Secondary | ICD-10-CM | POA: Diagnosis present

## 2020-09-27 DIAGNOSIS — I953 Hypotension of hemodialysis: Secondary | ICD-10-CM | POA: Diagnosis present

## 2020-09-27 DIAGNOSIS — I5082 Biventricular heart failure: Secondary | ICD-10-CM | POA: Diagnosis present

## 2020-09-27 DIAGNOSIS — Z87898 Personal history of other specified conditions: Secondary | ICD-10-CM

## 2020-09-27 DIAGNOSIS — Z9115 Patient's noncompliance with renal dialysis: Secondary | ICD-10-CM | POA: Diagnosis not present

## 2020-09-27 DIAGNOSIS — I252 Old myocardial infarction: Secondary | ICD-10-CM | POA: Diagnosis not present

## 2020-09-27 DIAGNOSIS — E785 Hyperlipidemia, unspecified: Secondary | ICD-10-CM | POA: Diagnosis present

## 2020-09-27 DIAGNOSIS — D631 Anemia in chronic kidney disease: Secondary | ICD-10-CM | POA: Diagnosis present

## 2020-09-27 DIAGNOSIS — Z9119 Patient's noncompliance with other medical treatment and regimen: Secondary | ICD-10-CM | POA: Diagnosis not present

## 2020-09-27 DIAGNOSIS — I110 Hypertensive heart disease with heart failure: Secondary | ICD-10-CM

## 2020-09-27 DIAGNOSIS — E1122 Type 2 diabetes mellitus with diabetic chronic kidney disease: Secondary | ICD-10-CM | POA: Diagnosis present

## 2020-09-27 DIAGNOSIS — Z89511 Acquired absence of right leg below knee: Secondary | ICD-10-CM | POA: Diagnosis not present

## 2020-09-27 DIAGNOSIS — E1165 Type 2 diabetes mellitus with hyperglycemia: Secondary | ICD-10-CM | POA: Diagnosis present

## 2020-09-27 LAB — GLUCOSE, CAPILLARY
Glucose-Capillary: 87 mg/dL (ref 70–99)
Glucose-Capillary: 106 mg/dL — ABNORMAL HIGH (ref 70–99)
Glucose-Capillary: 119 mg/dL — ABNORMAL HIGH (ref 70–99)
Glucose-Capillary: 113 mg/dL — ABNORMAL HIGH (ref 70–99)
Glucose-Capillary: 142 mg/dL — ABNORMAL HIGH (ref 70–99)

## 2020-09-27 LAB — RENAL FUNCTION PANEL
Albumin: 3.1 g/dL — ABNORMAL LOW (ref 3.5–5.0)
Anion gap: 7 (ref 5–15)
BUN: 34 mg/dL — ABNORMAL HIGH (ref 6–20)
CO2: 27 mmol/L (ref 22–32)
Calcium: 7.8 mg/dL — ABNORMAL LOW (ref 8.9–10.3)
Chloride: 100 mmol/L (ref 98–111)
Creatinine, Ser: 3.92 mg/dL — ABNORMAL HIGH (ref 0.61–1.24)
GFR, Estimated: 17 mL/min — ABNORMAL LOW (ref >60)
Glucose, Bld: 108 mg/dL — ABNORMAL HIGH (ref 70–99)
Phosphorus: 4.1 mg/dL (ref 2.5–4.6)
Potassium: 3.5 mmol/L (ref 3.5–5.1)
Sodium: 134 mmol/L — ABNORMAL LOW (ref 135–145)

## 2020-09-27 LAB — CBC
HCT: 29.6 % — ABNORMAL LOW (ref 39.0–52.0)
Hemoglobin: 9.5 g/dL — ABNORMAL LOW (ref 13.0–17.0)
MCH: 30.8 pg (ref 26.0–34.0)
MCHC: 32.1 g/dL (ref 30.0–36.0)
MCV: 96.1 fL (ref 80.0–100.0)
Platelets: 224 10*3/uL (ref 150–400)
RBC: 3.08 MIL/uL — ABNORMAL LOW (ref 4.22–5.81)
RDW: 15.2 % (ref 11.5–15.5)
WBC: 5.1 10*3/uL (ref 4.0–10.5)
nRBC: 0 % (ref 0.0–0.2)

## 2020-09-27 LAB — MAGNESIUM: Magnesium: 1.6 mg/dL — ABNORMAL LOW (ref 1.7–2.4)

## 2020-09-27 LAB — HEPATITIS B SURFACE ANTIGEN: Hepatitis B Surface Ag: NONREACTIVE

## 2020-09-27 MED ORDER — MAGNESIUM SULFATE 2 GM/50ML IV SOLN
2.0000 g | Freq: Once | INTRAVENOUS | Status: AC
  Administered 2020-09-27: 2 g via INTRAVENOUS
  Filled 2020-09-27: qty 50

## 2020-09-27 MED ORDER — INSULIN GLARGINE 100 UNIT/ML ~~LOC~~ SOLN
10.0000 [IU] | Freq: Every day | SUBCUTANEOUS | Status: DC
  Administered 2020-09-27: 10 [IU] via SUBCUTANEOUS
  Filled 2020-09-27 (×2): qty 0.1

## 2020-09-27 NOTE — Procedures (Signed)
   HEMODIALYSIS TREATMENT NOTE:  3.5 hour HD session completed via [facility] TDC.  2-3L goal NOT met: UF was limited by hypotension.  Net UF 1.7 liters.  All blood was returned.  No changes from pre-HD assessment.  Rockwell Alexandria, RN

## 2020-09-27 NOTE — TOC Progression Note (Signed)
Transition of Care [facility]) - Progression Note    Patient Details  Name: Zachary Young MRN: [id number] Date of Birth: 1960-06-23  Transition of [facility]) CM/SW Contact  Ihor Gully, LCSW Phone Number: 09/27/2020, 1:29 PM  Clinical Narrative:    Dr. Marval Regal with [facility] declines to accept patient at [facility].   Expected Discharge Plan: Home/Self Care Barriers to Discharge: Continued Medical Work up  Expected Discharge Plan and Services Expected Discharge Plan: Home/Self Care     Post Acute Care Choice: Dialysis Living arrangements for the past 2 months: Single Family Home                                       Social Determinants of Health (SDOH) Interventions    Readmission Risk Interventions Readmission Risk Prevention Plan 11/12/2019  Transportation Screening Complete  Medication Review [employer]) Referral to Pharmacy  PCP or Specialist appointment within 3-5 days of discharge Not Complete  PCP/Specialist Appt Not Complete comments plan for SNF; no PCP  HRI or Home Care Consult Complete  SW Recovery Care/Counseling Consult Complete  Palliative Care Screening Complete  Skilled Nursing Facility Complete  Some recent data might be hidden

## 2020-09-27 NOTE — Consult Note (Signed)
[facility] Renal Consultation Note    Indication for Consultation:  Management of ESRD/hemodialysis; anemia, hypertension/volume and secondary hyperparathyroidism  HPI: Zachary Young is a 60 y.o. male with a PMH significant for HTN, DM type 2, chronic combined systolic and diastolic CHF, HTN, PAD s/p RBKA, CAD, polysubstance abuse, and ESRD on HD MWF at [facility] who presented to [facility] ED with complaints of SOB and needing dialysis.  Apparently he was involuntarily discharged from his home HD unit due to recurrent behavioral issues and was instructed to go to the hospital for HD.  In the ED, he was complaining of right arm pain at the site of his recent fistula creation.  Vital signs were stable and SpO2 was 95% on room air.  CXR with mild to moderate pulmonary edema.  We were consulted to provide dialysis during his hospitalization.  Of note, he was covid-19 +.  Past Medical History:  Diagnosis Date   CHF (congestive heart failure) ([place])    Diabetes mellitus without complication ([place])    Dialysis patient [facility])    Peripheral edema    Renal disorder    kidney disease   Past Surgical History:  Procedure Laterality Date   AMPUTATION Right 10/21/2019   Procedure: RIGHT BELOW KNEE AMPUTATION;  Surgeon: Newt Minion, MD;  Location: [place];  Service: Orthopedics;  Laterality: Right;   AV FISTULA PLACEMENT Left 11/13/2019   Procedure: LEFT ARTERIOVENOUS (AV) FISTULA VERSUS ARTERIOVENOUS GRAFT;  Surgeon: Rosetta Posner, MD;  Location[facility] OR;  Service: Vascular;  Laterality: Left;   AV FISTULA PLACEMENT Left 04/05/2020   Procedure: INSERTION OF LEFT ARM ARTERIOVENOUS (AV) GORE-TEX GRAFT;  Surgeon: Rosetta Posner, MD;  Location: [facility] OR;  Service: Vascular;  Laterality: Left;   AV FISTULA PLACEMENT Right 07/14/2020   Procedure: RIGHT ARM ARTERIOVENOUS (AV) GRAFT CREATION;  Surgeon: Rosetta Posner, MD;  Location: [facility];  Service: Vascular;  Laterality: Right;   [place] Left 01/27/2020   Procedure: LEFT ARM 2ND STAGE [place];  Surgeon: Rosetta Posner, MD;  Location: [place];  Service: Vascular;  Laterality: Left;   DIALYSIS/PERMA CATHETER INSERTION N/A 03/03/2020   Procedure: DIALYSIS/PERMA CATHETER INSERTION;  Surgeon: Algernon Huxley, MD;  Location: [place] CV LAB;  Service: Cardiovascular;  Laterality: N/A;   ESOPHAGOGASTRODUODENOSCOPY (EGD) WITH PROPOFOL N/A 06/17/2020   gastritis, normal duodenum.    INSERTION OF DIALYSIS CATHETER N/A 04/05/2020   Procedure: INSERTION OF TUNNEL  DIALYSIS CATHETER LEFT INTERNAL JUGULAR;  Surgeon: Rosetta Posner, MD;  [facility];  Service: Vascular;  Laterality: N/A;   IR FLUORO GUIDE CV LINE RIGHT  11/05/2019   IR FLUORO GUIDE CV LINE RIGHT  06/07/2020   IR REMOVAL TUN CV CATH W/O FL  06/01/2020   IR THROMBECTOMY AV FISTULA W/THROMBOLYSIS/PTA INC/SHUNT/IMG LEFT Left 06/06/2020   IR US GUIDE VASC ACCESS LEFT  06/06/2020   IR US GUIDE VASC ACCESS RIGHT  11/05/2019   IR US GUIDE VASC ACCESS RIGHT  06/07/2020   REMOVAL OF A DIALYSIS CATHETER Right 04/05/2020   Procedure: REMOVAL OF RIGHT CHEST DIALYSIS CATHETER;  Surgeon: Rosetta Posner, MD;  Location[facility];  Service: Vascular;  Laterality: Right;   TOE AMPUTATION Bilateral    due to osteomyelitis, all 5 each foot   Family History:   Family History  Problem Relation Age of Onset   Cancer Mother        uknown type of cancer  Colon cancer Neg Hx    Colon polyps Neg Hx    Social History:  reports that he has been smoking cigarettes. He has been smoking an average of 0.50 packs per day. He has never used smokeless tobacco. He reports previous alcohol use. He reports previous drug use. Drug: Cocaine. Allergies  Allergen Reactions   Bee Venom Anaphylaxis    Per pt, nearly died from bee sting as a child   Propofol Other (See Comments)     Hallucinations   Eggs Or Egg-Derived Products Nausea And Vomiting and Other (See Comments)    Stomach  cramps in large amounts   Morphine Nausea And Vomiting   Oxycodone Nausea And Vomiting   Prior to Admission medications   Medication Sig Start Date End Date Taking? Authorizing Provider  amLODipine (NORVASC) 5 MG tablet Take 1 tablet (5 mg total) by mouth daily. 06/28/20  Yes Tat, Shanon Brow, MD  atorvastatin (LIPITOR) 20 MG tablet Take 1 tablet (20 mg total) by mouth daily with supper. 06/28/20  Yes Tat, Shanon Brow, MD  dicyclomine (BENTYL) 20 MG tablet Take 1 tablet (20 mg total) by mouth 2 (two) times daily. 07/22/20  Yes Margarita Mail, PA-C  ergocalciferol (VITAMIN D2) 1.25 MG (50000 UT) capsule Take 1 capsule (50,000 Units total) by mouth every Friday. 12/11/19  Yes Samella Parr, NP  insulin glargine (LANTUS) 100 UNIT/ML injection Inject 0.35 mLs (35 Units total) into the skin at bedtime. Patient taking differently: Inject 25 Units into the skin at bedtime. 12/11/19  Yes Samella Parr, NP  lidocaine-prilocaine (EMLA) cream Apply 1 application topically as needed (port site).   Yes [provider]  NOVOLOG FLEXPEN 100 UNIT/ML FlexPen Inject 4 Units into the skin 3 (three) times daily with meals. 05/24/20  Yes [provider]  oxyCODONE-acetaminophen (PERCOCET) 5-325 MG tablet Take 1 tablet by mouth every 6 (six) hours as needed. Patient taking differently: Take 1 tablet by mouth every 6 (six) hours as needed for moderate pain. 08/16/20  Yes Milton Ferguson, MD  pantoprazole (PROTONIX) 40 MG tablet Take 1 tablet (40 mg total) by mouth 2 (two) times daily before a meal. 08/02/20  Yes Annitta Needs, NP  phenytoin (DILANTIN) 100 MG ER capsule Take 3 capsules (300 mg total) by mouth at bedtime. 06/15/20  Yes Garvin Fila, MD  sucralfate (CARAFATE) 1 g tablet Take 1 tablet (1 g total) by mouth 4 (four) times daily -  with meals and at bedtime. 07/22/20  Yes Harris, Abigail, PA-C  torsemide (DEMADEX) 100 MG tablet Take 1 tablet (100 mg total) by mouth daily. On Monday-Wednesday-Friday-Sunday  (non-dialysis days) Patient taking differently: Take 100 mg by mouth 4 (four) times a week. On Monday-Wednesday-Friday-Sunday (non-dialysis days) 06/28/20  Yes Tat, Shanon Brow, MD   Current Facility-Administered Medications  Medication Dose Route Frequency Provider Last Rate Last Admin   0.9 %  sodium chloride infusion  100 mL Intravenous PRN Donato Heinz, MD       0.9 %  sodium chloride infusion  100 mL Intravenous PRN Donato Heinz, MD       0.9 %  sodium chloride infusion   Intravenous PRN Johnson, Clanford L, MD       acetaminophen (TYLENOL) tablet 650 mg  650 mg Oral Q6H PRN Johnson, Clanford L, MD       Or   acetaminophen (TYLENOL) suppository 650 mg  650 mg Rectal Q6H PRN Johnson, Clanford L, MD       albuterol (VENTOLIN HFA)  108 (90 Base) MCG/ACT inhaler 2 puff  2 puff Inhalation Once PRN Johnson, Clanford L, MD       alteplase (CATHFLO ACTIVASE) injection 2 mg  2 mg Intracatheter Once PRN Donato Heinz, MD       atorvastatin (LIPITOR) tablet 20 mg  20 mg Oral Q supper Johnson, Clanford L, MD       bisacodyl (DULCOLAX) EC tablet 5 mg  5 mg Oral Daily PRN Johnson, Clanford L, MD       Chlorhexidine Gluconate Cloth 2 % PADS 6 each  6 each Topical Q0600 Johnson, Clanford L, MD       dicyclomine (BENTYL) capsule 20 mg  20 mg Oral BID Johnson, Clanford L, MD   20 mg at 09/27/20 0915   diphenhydrAMINE (BENADRYL) injection 50 mg  50 mg Intravenous Once PRN Johnson, Clanford L, MD       EPINEPHrine (EPI-PEN) injection 0.3 mg  0.3 mg Intramuscular Once PRN Johnson, Clanford L, MD       famotidine (PEPCID) IVPB 20 mg premix  20 mg Intravenous Once PRN Wynetta Emery, Clanford L, MD       heparin injection 2,800 Units  20 Units/kg Dialysis PRN Donato Heinz, MD       heparin injection 3,200 Units  3,200 Units Dialysis PRN Donato Heinz, MD       heparin injection 5,000 Units  5,000 Units Subcutaneous Q8H Johnson, Clanford L, MD       insulin aspart (novoLOG) injection 0-6 Units  0-6  Units Subcutaneous TID WC Johnson, Clanford L, MD       insulin aspart (novoLOG) injection 2 Units  2 Units Subcutaneous TID WC Johnson, Clanford L, MD   2 Units at 09/26/20 1800   insulin glargine (LANTUS) injection 15 Units  15 Units Subcutaneous QHS Johnson, Clanford L, MD       magnesium sulfate IVPB 2 g 50 mL  2 g Intravenous Once Johnson, Clanford L, MD       methylPREDNISolone sodium succinate (SOLU-MEDROL) 125 mg/2 mL injection 125 mg  125 mg Intravenous Once PRN Johnson, Clanford L, MD       nicotine (NICODERM CQ - dosed in mg/24 hours) patch 21 mg  21 mg Transdermal Daily PRN Johnson, Clanford L, MD       oxyCODONE-acetaminophen (PERCOCET/ROXICET) 5-325 MG per tablet 1 tablet  1 tablet Oral Q6H PRN Wynetta Emery, Clanford L, MD   1 tablet at 09/27/20 0307   pantoprazole (PROTONIX) EC tablet 40 mg  40 mg Oral BID AC Johnson, Clanford L, MD   40 mg at 09/27/20 0916   phenytoin (DILANTIN) ER capsule 300 mg  300 mg Oral QHS Johnson, Clanford L, MD       prochlorperazine (COMPAZINE) injection 10 mg  10 mg Intravenous Q6H PRN Johnson, Clanford L, MD       sucralfate (CARAFATE) tablet 1 g  1 g Oral TID WC & HS Johnson, Clanford L, MD   1 g at 09/27/20 0915   torsemide (DEMADEX) tablet 100 mg  100 mg Oral Once per day on Sun Tue Thu Sat Irwin Brakeman L, MD   100 mg at 09/27/20 0915   [START ON 09/30/2020] Vitamin D (Ergocalciferol) (DRISDOL) capsule 50,000 Units  50,000 Units Oral Q Susa Simmonds, MD       Labs: Basic Metabolic Panel: Recent Labs  Lab 09/26/20 1243 09/27/20 0529  NA 136 134*  K 4.4 3.5  CL 104 100  CO2 22 27  GLUCOSE 162* 108*  BUN 58* 34*  CREATININE 5.60* 3.92*  CALCIUM 7.7* 7.8*  PHOS  --  4.1   Liver Function Tests: Recent Labs  Lab 09/27/20 0529  ALBUMIN 3.1*   No results for input(s): LIPASE, AMYLASE in the last 168 hours. No results for input(s): AMMONIA in the last 168 hours. CBC: Recent Labs  Lab 09/26/20 1243 09/27/20 0529  WBC 5.9 5.1   NEUTROABS 3.9  --   HGB 9.1* 9.5*  HCT 29.0* 29.6*  MCV 95.4 96.1  PLT 252 224   Cardiac Enzymes: No results for input(s): CKTOTAL, CKMB, CKMBINDEX, TROPONINI in the last 168 hours. CBG: Recent Labs  Lab 09/26/20 1746 09/26/20 2128 09/27/20 0311 09/27/20 0755 09/27/20 1125  GLUCAP 91 110* 87 106* 119*   Iron Studies: No results for input(s): IRON, TIBC, TRANSFERRIN, FERRITIN in the last 72 hours. Studies/Results: DG Chest Port 1 View  Result Date: 09/26/2020 CLINICAL DATA:  Dyspnea, right upper extremity swelling, CHF EXAM: PORTABLE CHEST 1 VIEW COMPARISON:  09/15/2020 chest radiograph. FINDINGS: Right internal jugular central venous catheter terminates at the cavoatrial junction. Stable cardiomediastinal silhouette with mild cardiomegaly. No pneumothorax. No pleural effusion. Mild-to-moderate pulmonary edema. No consolidative airspace disease. IMPRESSION: Mild-to-moderate congestive heart failure. Electronically Signed   By: Ilona Sorrel M.D.   On: 09/26/2020 13:21    ROS: Pertinent items are noted in HPI. Physical Exam: Vitals:   09/27/20 0230 09/27/20 0250 09/27/20 0500 09/27/20 0531  BP: 102/61 100/61  139/79  Pulse: 76 77  80  Resp:    18  Temp:    97.8 F (36.6 C)  TempSrc:    Oral  SpO2:    100%  Weight:   131.7 kg   Height:          Weight change:   Intake/Output Summary (Last 24 hours) at 09/27/2020 1213 Last data filed at 09/27/2020 0700 Gross per 24 hour  Intake 480 ml  Output 1721 ml  Net -1241 ml   BP 139/79 (BP Location: Left Arm)   Pulse 80   Temp 97.8 F (36.6 C) (Oral)   Resp 18   Ht '6\' 3"'$  (1.905 m)   Wt 131.7 kg   SpO2 100%   BMI 36.29 kg/m   Physical exam: unable to complete due to COVID + status.  In order to preserve PPE equipment and to minimize exposure to providers.  Notes from other caregivers reviewed   Dialysis Access:  RIJ TDC but right AVG placed 07/14/20 but too painful to use per pt   Dialysis Orders: Center: [facility]  on MWF . EDW 123 kg HD Bath 3K/2.5 Ca Time 255 min  Heparin 1500 units bolus. Access RIJ TDC BFR 400 DFR 500    Epogen 4400 Units IV/HD  Assessment/Plan:  Volume overload - due to missed HD treatments.  UF limited by hypotension but did pull off almost 2 liters and SpO2 is 100% on room air  ESRD -  plan for HD again tomorrow.  Hypertension/volume  - as above  Anemia  - Will dose ESA tomorrow with HD  Metabolic bone disease -  continue with home meds  Nutrition - renal diet Covid-19+ - presumably an incidental finding as he is asymptomatic.  Disposition - difficult situation given his dismissal from his local unit.  He is talking with case manage and social work to help place him back in his previous unit in [place].  He may benefit from psych evaluation as his  behavior may improve with medications if he does have a psychiatric diagnosis vs personality disorder.   Donetta Potts, MD [place] Pager [phone] 09/27/2020, 12:13 PM
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
## 2020-09-28 ENCOUNTER — Encounter: Payer: Self-pay | Admitting: Neurology

## 2020-09-28 ENCOUNTER — Ambulatory Visit: Payer: Medicaid Other | Admitting: Neurology

## 2020-09-28 LAB — RENAL FUNCTION PANEL
Albumin: 3 g/dL — ABNORMAL LOW (ref 3.5–5.0)
Anion gap: 8 (ref 5–15)
BUN: 47 mg/dL — ABNORMAL HIGH (ref 6–20)
CO2: 26 mmol/L (ref 22–32)
Calcium: 8 mg/dL — ABNORMAL LOW (ref 8.9–10.3)
Chloride: 102 mmol/L (ref 98–111)
Creatinine, Ser: 4.84 mg/dL — ABNORMAL HIGH (ref 0.61–1.24)
GFR, Estimated: 13 mL/min — ABNORMAL LOW (ref >60)
Glucose, Bld: 136 mg/dL — ABNORMAL HIGH (ref 70–99)
Phosphorus: 5.1 mg/dL — ABNORMAL HIGH (ref 2.5–4.6)
Potassium: 4.3 mmol/L (ref 3.5–5.1)
Sodium: 136 mmol/L (ref 135–145)

## 2020-09-28 LAB — MAGNESIUM: Magnesium: 2.1 mg/dL (ref 1.7–2.4)

## 2020-09-28 LAB — GLUCOSE, CAPILLARY
Glucose-Capillary: 177 mg/dL — ABNORMAL HIGH (ref 70–99)
Glucose-Capillary: 123 mg/dL — ABNORMAL HIGH (ref 70–99)
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
## 2020-09-28 NOTE — Progress Notes (Signed)
Patient agitated and irritable. Patient requesting to leave against medical advice. MD is currently in room with patient. Discussed leaving against medical advice with patient and he is insistent on leaving immediately. Discussed adverse effects of leaving prior to receiving dialysis and patient has refused to wait for dialysis procedure. Patient has requested that IV is removed immediately. Pt's IV catheter removed and intact. Pt's IV site clean dry and intact. . Pt in stable condition and in no acute distress at this time. Against Medical Advice [facility]) document has been signed by patient and is in his chart.

## 2020-09-29 ENCOUNTER — Other Ambulatory Visit: Payer: Self-pay

## 2020-09-29 ENCOUNTER — Emergency Department (HOSPITAL_COMMUNITY)
Admission: EM | Admit: 2020-09-29 | Discharge: 2020-09-29 | Disposition: A | Discharge status: Home / Self Care | Payer: Medicaid Other | Attending: Emergency Medicine | Admitting: Emergency Medicine

## 2020-09-29 ENCOUNTER — Encounter (HOSPITAL_COMMUNITY): Payer: Self-pay

## 2020-09-29 DIAGNOSIS — Z5321 Procedure and treatment not carried out due to patient leaving prior to being seen by health care provider: Secondary | ICD-10-CM | POA: Diagnosis not present

## 2020-09-29 DIAGNOSIS — U071 COVID-19: Secondary | ICD-10-CM | POA: Insufficient documentation

## 2020-09-29 DIAGNOSIS — R059 Cough, unspecified: Secondary | ICD-10-CM | POA: Diagnosis present

## 2020-09-29 NOTE — ED Triage Notes (Signed)
Pt states n/v/d x 2 days after being told he was covid positive here last visit, pt has multiple complaints, loss of appetite & smell, lightheaded, dizzy, dialysis last on Monday, just got out of hospital yesterday and he states he did not get dialysis then. 99% on RA

## 2020-10-04 ENCOUNTER — Emergency Department (HOSPITAL_COMMUNITY)
Admission: EM | Admit: 2020-10-04 | Discharge: 2020-10-05 | Discharge status: Against Medical Advice | Payer: Medicaid Other | Attending: Emergency Medicine | Admitting: Emergency Medicine

## 2020-10-04 ENCOUNTER — Other Ambulatory Visit: Payer: Self-pay

## 2020-10-04 ENCOUNTER — Encounter (HOSPITAL_COMMUNITY): Payer: Self-pay | Admitting: *Deleted

## 2020-10-04 DIAGNOSIS — Z794 Long term (current) use of insulin: Secondary | ICD-10-CM | POA: Insufficient documentation

## 2020-10-04 DIAGNOSIS — M7989 Other specified soft tissue disorders: Secondary | ICD-10-CM | POA: Diagnosis present

## 2020-10-04 DIAGNOSIS — N186 End stage renal disease: Secondary | ICD-10-CM | POA: Insufficient documentation

## 2020-10-04 DIAGNOSIS — E1122 Type 2 diabetes mellitus with diabetic chronic kidney disease: Secondary | ICD-10-CM | POA: Insufficient documentation

## 2020-10-04 DIAGNOSIS — Z992 Dependence on renal dialysis: Secondary | ICD-10-CM | POA: Diagnosis not present

## 2020-10-04 DIAGNOSIS — I132 Hypertensive heart and chronic kidney disease with heart failure and with stage 5 chronic kidney disease, or end stage renal disease: Secondary | ICD-10-CM | POA: Insufficient documentation

## 2020-10-04 DIAGNOSIS — Z79899 Other long term (current) drug therapy: Secondary | ICD-10-CM | POA: Insufficient documentation

## 2020-10-04 DIAGNOSIS — F17210 Nicotine dependence, cigarettes, uncomplicated: Secondary | ICD-10-CM | POA: Diagnosis not present

## 2020-10-04 DIAGNOSIS — Z8616 Personal history of COVID-19: Secondary | ICD-10-CM | POA: Diagnosis not present

## 2020-10-04 DIAGNOSIS — R600 Localized edema: Secondary | ICD-10-CM | POA: Insufficient documentation

## 2020-10-04 DIAGNOSIS — Z5321 Procedure and treatment not carried out due to patient leaving prior to being seen by health care provider: Secondary | ICD-10-CM | POA: Diagnosis not present

## 2020-10-04 DIAGNOSIS — I5042 Chronic combined systolic (congestive) and diastolic (congestive) heart failure: Secondary | ICD-10-CM | POA: Insufficient documentation

## 2020-10-04 LAB — BASIC METABOLIC PANEL
Anion gap: 11 (ref 5–15)
BUN: 65 mg/dL — ABNORMAL HIGH (ref 6–20)
CO2: 23 mmol/L (ref 22–32)
Calcium: 8.4 mg/dL — ABNORMAL LOW (ref 8.9–10.3)
Chloride: 102 mmol/L (ref 98–111)
Creatinine, Ser: 6.15 mg/dL — ABNORMAL HIGH (ref 0.61–1.24)
GFR, Estimated: 10 mL/min — ABNORMAL LOW (ref >60)
Glucose, Bld: 145 mg/dL — ABNORMAL HIGH (ref 70–99)
Potassium: 4.1 mmol/L (ref 3.5–5.1)
Sodium: 136 mmol/L (ref 135–145)

## 2020-10-04 LAB — CBC WITH DIFFERENTIAL/PLATELET
Abs Immature Granulocytes: 0.02 10*3/uL (ref 0.00–0.07)
Basophils Absolute: 0 10*3/uL (ref 0.0–0.1)
Basophils Relative: 0 %
Eosinophils Absolute: 0.1 10*3/uL (ref 0.0–0.5)
Eosinophils Relative: 2 %
HCT: 32.1 % — ABNORMAL LOW (ref 39.0–52.0)
Hemoglobin: 10.5 g/dL — ABNORMAL LOW (ref 13.0–17.0)
Immature Granulocytes: 0 %
Lymphocytes Relative: 25 %
Lymphs Abs: 1.9 10*3/uL (ref 0.7–4.0)
MCH: 30.3 pg (ref 26.0–34.0)
MCHC: 32.7 g/dL (ref 30.0–36.0)
MCV: 92.8 fL (ref 80.0–100.0)
Monocytes Absolute: 0.7 10*3/uL (ref 0.1–1.0)
Monocytes Relative: 9 %
Neutro Abs: 4.8 10*3/uL (ref 1.7–7.7)
Neutrophils Relative %: 64 %
Platelets: 291 10*3/uL (ref 150–400)
RBC: 3.46 MIL/uL — ABNORMAL LOW (ref 4.22–5.81)
RDW: 14.6 % (ref 11.5–15.5)
WBC: 7.5 10*3/uL (ref 4.0–10.5)
nRBC: 0 % (ref 0.0–0.2)

## 2020-10-04 MED ORDER — OXYCODONE-ACETAMINOPHEN 5-325 MG PO TABS
1.0000 | ORAL_TABLET | Freq: Once | ORAL | Status: AC
  Administered 2020-10-04: 1 via ORAL
  Filled 2020-10-04: qty 1

## 2020-10-04 NOTE — ED Provider Notes (Signed)
  Provider Note MRN:  [id number]  Arrival date & time: 10/04/20    ED Course and Medical Decision Making  Assumed care from Dr. Reather Converse at shift change.  Noncompliant with dialysis, pain and swelling to the arm, awaiting ultrasound in the morning and will see if we can coordinate outpatient dialysis.  Patient eloped.  Procedures  Final Clinical Impressions(s) / ED Diagnoses     ICD-10-CM   1. ESRD on dialysis ([place])  N18.6    Z99.2     2. Edema of right upper arm  R60.0       ED Discharge Orders     None       Discharge Instructions   None     Zachary Young. Zachary Young, [place] [email or web address]    Maudie Flakes, MD 10/05/20 [phone]
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
## 2020-10-04 NOTE — ED Triage Notes (Signed)
Pt with continued arm pain and swelling. And needs dialysis, last done on 09/29/20.  Pt not able to get dialysis at his usual facility because he was transferred.

## 2020-10-05 ENCOUNTER — Ambulatory Visit (HOSPITAL_COMMUNITY): Payer: Medicaid Other

## 2020-10-05 NOTE — ED Notes (Signed)
Pt seem "rolling" out. Pt stopped and to see if IV's were still in place but pt ripped them out. Pt stated that he did not want to stay.

## 2020-10-10 ENCOUNTER — Ambulatory Visit (INDEPENDENT_AMBULATORY_CARE_PROVIDER_SITE_OTHER): Payer: Medicaid Other | Admitting: Vascular Surgery

## 2020-10-10 ENCOUNTER — Other Ambulatory Visit: Payer: Self-pay

## 2020-10-10 ENCOUNTER — Encounter: Payer: Self-pay | Admitting: Vascular Surgery

## 2020-10-10 VITALS — BP 150/81 | HR 85 | Temp 98.1°F | Resp 18 | Ht 72.0 in | Wt 210.0 lb

## 2020-10-10 DIAGNOSIS — Z992 Dependence on renal dialysis: Secondary | ICD-10-CM

## 2020-10-10 DIAGNOSIS — N186 End stage renal disease: Secondary | ICD-10-CM

## 2020-10-10 NOTE — Progress Notes (Signed)
[facility] [place]  Patient name: Zachary Young MRN: [id number] DOB: 1961-01-16 Sex: male  REASON FOR VISIT: Follow-up right forearm loop AV Gore-Tex graft  HPI: Zachary Young is a 60 y.o. male here today for follow-up.  Had placement of right forearm loop AV Gore-Tex graft on 07/14/2020.  He has missed multiple follow-up 1 month due to other medical issues.  He reports that he has not had dialysis for 3 weeks because he was "kicked out" from [facility] dialysis apparently for behavioral issues.  He reports that he went to [facility] and was told that his graft was occluded.  It sounds as though they may have done a shuntogram from what he describes.  Current Outpatient Medications  Medication Sig Dispense Refill   amLODipine (NORVASC) 5 MG tablet Take 1 tablet (5 mg total) by mouth daily. 30 tablet 1   atorvastatin (LIPITOR) 20 MG tablet Take 1 tablet (20 mg total) by mouth daily with supper. 30 tablet 1   dicyclomine (BENTYL) 20 MG tablet Take 1 tablet (20 mg total) by mouth 2 (two) times daily. 20 tablet 0   ergocalciferol (VITAMIN D2) 1.25 MG (50000 UT) capsule Take 1 capsule (50,000 Units total) by mouth every Friday. 30 capsule 1   insulin glargine (LANTUS) 100 UNIT/ML injection Inject 0.35 mLs (35 Units total) into the skin at bedtime. (Patient taking differently: Inject 25 Units into the skin at bedtime.) 10 mL 11   lidocaine-prilocaine (EMLA) cream Apply 1 application topically as needed (port site).     NOVOLOG FLEXPEN 100 UNIT/ML FlexPen Inject 4 Units into the skin 3 (three) times daily with meals.     oxyCODONE-acetaminophen (PERCOCET) 5-325 MG tablet Take 1 tablet by mouth every 6 (six) hours as needed. (Patient taking differently: Take 1 tablet by mouth every 6 (six) hours as needed for moderate pain.) 20 tablet 0   pantoprazole (PROTONIX) 40 MG tablet Take 1 tablet (40 mg total) by mouth 2 (two) times daily before a meal.  60 tablet 3   phenytoin (DILANTIN) 100 MG ER capsule Take 3 capsules (300 mg total) by mouth at bedtime. 30 capsule 3   sucralfate (CARAFATE) 1 g tablet Take 1 tablet (1 g total) by mouth 4 (four) times daily -  with meals and at bedtime. 90 tablet 0   torsemide (DEMADEX) 100 MG tablet Take 1 tablet (100 mg total) by mouth daily. On Monday-Wednesday-Friday-Sunday (non-dialysis days) (Patient taking differently: Take 100 mg by mouth 4 (four) times a week. On Monday-Wednesday-Friday-Sunday (non-dialysis days)) 30 tablet 1   No current facility-administered medications for this visit.     PHYSICAL EXAM: Vitals:   10/10/20 1040  BP: (!) 150/81  Pulse: 85  Resp: 18  Temp: 98.1 F (36.7 C)  TempSrc: Temporal  SpO2: 97%  Weight: 210 lb (95.3 kg)  Height: 6' (1.829 m)    GENERAL: The patient is a well-nourished male, in no acute distress. The vital signs are documented above. Patient has an old nonfunctional left upper arm loop which is occluded.  His right forearm graft is a loop forearm graft.  He has good thrill and excellent bruit through the graft and also excellent bruit in his upper arm medial aspect.  Surgical incisions are well-healed  MEDICAL ISSUES: Completely healed right forearm loop graft with excellent thrill and bruit.  Unclear as to concerns expressed at [facility].  I feel that he is safe to attempt use of his graft at any time.  If there are flow problems, could have a shuntogram to determine if your anastomotic problems or subclavian venous issues.  He will attempt dialysis when he is reestablished with a new unit.   Rosetta Posner, MD FACS [facility] [phone]  Note: Portions of this report may have been transcribed using voice recognition software.  Every effort has been made to ensure accuracy; however, inadvertent computerized transcription errors may still be present.

## 2020-10-11 ENCOUNTER — Encounter: Payer: Self-pay | Admitting: Internal Medicine

## 2020-10-13 ENCOUNTER — Emergency Department (HOSPITAL_COMMUNITY)
Admission: EM | Admit: 2020-10-13 | Discharge: 2020-10-13 | Discharge status: Against Medical Advice | Payer: Medicaid Other

## 2020-10-14 ENCOUNTER — Other Ambulatory Visit: Payer: Self-pay

## 2020-10-14 ENCOUNTER — Emergency Department (HOSPITAL_COMMUNITY)
Admission: EM | Admit: 2020-10-14 | Discharge: 2020-10-14 | Discharge status: Against Medical Advice | Payer: Medicaid Other
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
## 2020-10-20 ENCOUNTER — Emergency Department
Admission: EM | Admit: 2020-10-20 | Discharge: 2020-10-20 | Disposition: A | Discharge status: Home / Self Care | Payer: Medicaid Other | Attending: Emergency Medicine | Admitting: Emergency Medicine

## 2020-10-20 ENCOUNTER — Other Ambulatory Visit: Payer: Self-pay

## 2020-10-20 DIAGNOSIS — Z992 Dependence on renal dialysis: Secondary | ICD-10-CM | POA: Insufficient documentation

## 2020-10-20 DIAGNOSIS — Z794 Long term (current) use of insulin: Secondary | ICD-10-CM | POA: Diagnosis not present

## 2020-10-20 DIAGNOSIS — I5042 Chronic combined systolic (congestive) and diastolic (congestive) heart failure: Secondary | ICD-10-CM | POA: Diagnosis not present

## 2020-10-20 DIAGNOSIS — Z8616 Personal history of COVID-19: Secondary | ICD-10-CM | POA: Insufficient documentation

## 2020-10-20 DIAGNOSIS — N186 End stage renal disease: Secondary | ICD-10-CM | POA: Insufficient documentation

## 2020-10-20 DIAGNOSIS — F17210 Nicotine dependence, cigarettes, uncomplicated: Secondary | ICD-10-CM | POA: Diagnosis not present

## 2020-10-20 DIAGNOSIS — I132 Hypertensive heart and chronic kidney disease with heart failure and with stage 5 chronic kidney disease, or end stage renal disease: Secondary | ICD-10-CM | POA: Diagnosis not present

## 2020-10-20 DIAGNOSIS — R531 Weakness: Secondary | ICD-10-CM | POA: Diagnosis present

## 2020-10-20 DIAGNOSIS — Z79899 Other long term (current) drug therapy: Secondary | ICD-10-CM | POA: Insufficient documentation

## 2020-10-20 DIAGNOSIS — E1140 Type 2 diabetes mellitus with diabetic neuropathy, unspecified: Secondary | ICD-10-CM | POA: Insufficient documentation

## 2020-10-20 DIAGNOSIS — Z7984 Long term (current) use of oral hypoglycemic drugs: Secondary | ICD-10-CM | POA: Insufficient documentation

## 2020-10-20 LAB — COMPREHENSIVE METABOLIC PANEL
ALT: 11 U/L (ref 0–44)
AST: 9 U/L — ABNORMAL LOW (ref 15–41)
Albumin: 3.4 g/dL — ABNORMAL LOW (ref 3.5–5.0)
Alkaline Phosphatase: 107 U/L (ref 38–126)
Anion gap: 10 (ref 5–15)
BUN: 79 mg/dL — ABNORMAL HIGH (ref 6–20)
CO2: 20 mmol/L — ABNORMAL LOW (ref 22–32)
Calcium: 7.8 mg/dL — ABNORMAL LOW (ref 8.9–10.3)
Chloride: 109 mmol/L (ref 98–111)
Creatinine, Ser: 6.75 mg/dL — ABNORMAL HIGH (ref 0.61–1.24)
GFR, Estimated: 9 mL/min — ABNORMAL LOW (ref >60)
Glucose, Bld: 178 mg/dL — ABNORMAL HIGH (ref 70–99)
Potassium: 4.6 mmol/L (ref 3.5–5.1)
Sodium: 139 mmol/L (ref 135–145)
Total Bilirubin: 0.6 mg/dL (ref 0.3–1.2)
Total Protein: 7.9 g/dL (ref 6.5–8.1)

## 2020-10-20 LAB — CBC
HCT: 34.1 % — ABNORMAL LOW (ref 39.0–52.0)
Hemoglobin: 11.5 g/dL — ABNORMAL LOW (ref 13.0–17.0)
MCH: 30.4 pg (ref 26.0–34.0)
MCHC: 33.7 g/dL (ref 30.0–36.0)
MCV: 90.2 fL (ref 80.0–100.0)
Platelets: 245 10*3/uL (ref 150–400)
RBC: 3.78 MIL/uL — ABNORMAL LOW (ref 4.22–5.81)
RDW: 14.6 % (ref 11.5–15.5)
WBC: 6.5 10*3/uL (ref 4.0–10.5)
nRBC: 0 % (ref 0.0–0.2)

## 2020-10-20 NOTE — Discharge Instructions (Addendum)
Please seek medical attention for any high fevers, chest pain, shortness of breath, change in behavior, persistent vomiting, bloody stool or any other new or concerning symptoms.  
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
## 2020-10-20 NOTE — ED Triage Notes (Signed)
Pt states he has not had dialysis in the past 3 weeks, states he was living in [place] but then was move the [place] to a boarding house and then back again and has not established care here.

## 2020-10-25 ENCOUNTER — Encounter (HOSPITAL_COMMUNITY): Payer: Self-pay | Admitting: Emergency Medicine

## 2020-10-25 ENCOUNTER — Other Ambulatory Visit: Payer: Self-pay

## 2020-10-25 ENCOUNTER — Emergency Department (HOSPITAL_COMMUNITY)
Admission: EM | Admit: 2020-10-25 | Discharge: 2020-10-25 | Disposition: A | Discharge status: Home / Self Care | Payer: Medicaid Other | Attending: Emergency Medicine | Admitting: Emergency Medicine

## 2020-10-25 DIAGNOSIS — Z5321 Procedure and treatment not carried out due to patient leaving prior to being seen by health care provider: Secondary | ICD-10-CM | POA: Insufficient documentation

## 2020-10-25 DIAGNOSIS — R1084 Generalized abdominal pain: Secondary | ICD-10-CM | POA: Diagnosis not present

## 2020-10-25 DIAGNOSIS — R197 Diarrhea, unspecified: Secondary | ICD-10-CM | POA: Diagnosis not present

## 2020-10-25 NOTE — ED Notes (Signed)
Pt seen coming and going from parking lot multiple times.

## 2020-10-25 NOTE — ED Triage Notes (Signed)
Pt c/o severe generalized abd pain for the past 4 days. States he is also having diarrhea with blood in it.

## 2020-10-26 ENCOUNTER — Emergency Department (HOSPITAL_COMMUNITY)
Admission: EM | Admit: 2020-10-26 | Discharge: 2020-10-26 | Disposition: A | Discharge status: Home / Self Care | Payer: Medicaid Other | Attending: Emergency Medicine | Admitting: Emergency Medicine

## 2020-10-26 ENCOUNTER — Emergency Department (HOSPITAL_COMMUNITY): Payer: Medicaid Other

## 2020-10-26 DIAGNOSIS — E1122 Type 2 diabetes mellitus with diabetic chronic kidney disease: Secondary | ICD-10-CM | POA: Diagnosis not present

## 2020-10-26 DIAGNOSIS — F17210 Nicotine dependence, cigarettes, uncomplicated: Secondary | ICD-10-CM | POA: Diagnosis not present

## 2020-10-26 DIAGNOSIS — K921 Melena: Secondary | ICD-10-CM | POA: Diagnosis not present

## 2020-10-26 DIAGNOSIS — I509 Heart failure, unspecified: Secondary | ICD-10-CM | POA: Diagnosis not present

## 2020-10-26 DIAGNOSIS — N189 Chronic kidney disease, unspecified: Secondary | ICD-10-CM | POA: Insufficient documentation

## 2020-10-26 DIAGNOSIS — Z992 Dependence on renal dialysis: Secondary | ICD-10-CM | POA: Diagnosis not present

## 2020-10-26 DIAGNOSIS — Z20822 Contact with and (suspected) exposure to covid-19: Secondary | ICD-10-CM | POA: Diagnosis not present

## 2020-10-26 LAB — CBC WITH DIFFERENTIAL/PLATELET
Abs Immature Granulocytes: 0.02 10*3/uL (ref 0.00–0.07)
Basophils Absolute: 0 10*3/uL (ref 0.0–0.1)
Basophils Relative: 0 %
Eosinophils Absolute: 0.1 10*3/uL (ref 0.0–0.5)
Eosinophils Relative: 2 %
HCT: 30.9 % — ABNORMAL LOW (ref 39.0–52.0)
Hemoglobin: 10.5 g/dL — ABNORMAL LOW (ref 13.0–17.0)
Immature Granulocytes: 0 %
Lymphocytes Relative: 24 %
Lymphs Abs: 1.9 10*3/uL (ref 0.7–4.0)
MCH: 31.5 pg (ref 26.0–34.0)
MCHC: 34 g/dL (ref 30.0–36.0)
MCV: 92.8 fL (ref 80.0–100.0)
Monocytes Absolute: 0.7 10*3/uL (ref 0.1–1.0)
Monocytes Relative: 9 %
Neutro Abs: 5 10*3/uL (ref 1.7–7.7)
Neutrophils Relative %: 65 %
Platelets: 220 10*3/uL (ref 150–400)
RBC: 3.33 MIL/uL — ABNORMAL LOW (ref 4.22–5.81)
RDW: 14.2 % (ref 11.5–15.5)
WBC: 7.7 10*3/uL (ref 4.0–10.5)
nRBC: 0 % (ref 0.0–0.2)

## 2020-10-26 LAB — MAGNESIUM: Magnesium: 1.3 mg/dL — ABNORMAL LOW (ref 1.7–2.4)

## 2020-10-26 LAB — COMPREHENSIVE METABOLIC PANEL
ALT: 12 U/L (ref 0–44)
AST: 11 U/L — ABNORMAL LOW (ref 15–41)
Albumin: 3.2 g/dL — ABNORMAL LOW (ref 3.5–5.0)
Alkaline Phosphatase: 101 U/L (ref 38–126)
Anion gap: 12 (ref 5–15)
BUN: 97 mg/dL — ABNORMAL HIGH (ref 6–20)
CO2: 20 mmol/L — ABNORMAL LOW (ref 22–32)
Calcium: 6.6 mg/dL — ABNORMAL LOW (ref 8.9–10.3)
Chloride: 102 mmol/L (ref 98–111)
Creatinine, Ser: 7.74 mg/dL — ABNORMAL HIGH (ref 0.61–1.24)
GFR, Estimated: 7 mL/min — ABNORMAL LOW (ref >60)
Glucose, Bld: 190 mg/dL — ABNORMAL HIGH (ref 70–99)
Potassium: 4 mmol/L (ref 3.5–5.1)
Sodium: 134 mmol/L — ABNORMAL LOW (ref 135–145)
Total Bilirubin: 0.2 mg/dL — ABNORMAL LOW (ref 0.3–1.2)
Total Protein: 7.2 g/dL (ref 6.5–8.1)

## 2020-10-26 LAB — POC OCCULT BLOOD, ED: Fecal Occult Bld: NEGATIVE

## 2020-10-26 LAB — TYPE AND SCREEN
ABO/RH(D): B POS
Antibody Screen: NEGATIVE

## 2020-10-26 LAB — PROTIME-INR
INR: 1 (ref 0.8–1.2)
Prothrombin Time: 13.5 seconds (ref 11.4–15.2)

## 2020-10-26 LAB — RESP PANEL BY RT-PCR (FLU A&B, COVID) ARPGX2
Influenza A by PCR: NEGATIVE
Influenza B by PCR: NEGATIVE
SARS Coronavirus 2 by RT PCR: NEGATIVE

## 2020-10-26 LAB — PHOSPHORUS: Phosphorus: 8.5 mg/dL — ABNORMAL HIGH (ref 2.5–4.6)

## 2020-10-26 MED ORDER — OXYCODONE-ACETAMINOPHEN 5-325 MG PO TABS: 1.0000 | ORAL_TABLET | Freq: Once | ORAL | Status: DC

## 2020-10-26 MED ORDER — ALTEPLASE 2 MG IJ SOLR
2.0000 mg | Freq: Once | INTRAMUSCULAR | Status: AC | PRN
  Administered 2020-10-26: 4 mg
  Filled 2020-10-26: qty 2

## 2020-10-26 MED ORDER — HEPARIN SODIUM (PORCINE) 1000 UNIT/ML DIALYSIS: 1000.0000 [IU] | INTRAMUSCULAR | Status: DC | PRN

## 2020-10-26 MED ORDER — OXYCODONE-ACETAMINOPHEN 5-325 MG PO TABS
1.0000 | ORAL_TABLET | Freq: Once | ORAL | Status: AC
  Administered 2020-10-26: 1 via ORAL
  Filled 2020-10-26: qty 1

## 2020-10-26 MED ORDER — SODIUM CHLORIDE 0.9 % IV SOLN: 100.0000 mL | INTRAVENOUS | Status: DC | PRN

## 2020-10-26 MED ORDER — HEPARIN SODIUM (PORCINE) 1000 UNIT/ML DIALYSIS
20.0000 [IU]/kg | INTRAMUSCULAR | Status: DC | PRN
  Administered 2020-10-26: 2000 [IU] via INTRAVENOUS_CENTRAL

## 2020-10-26 MED ORDER — HEPARIN SODIUM (PORCINE) 1000 UNIT/ML DIALYSIS
3200.0000 [IU] | INTRAMUSCULAR | Status: DC | PRN
  Administered 2020-10-26: 3200 [IU] via INTRAVENOUS_CENTRAL

## 2020-10-26 MED ORDER — CHLORHEXIDINE GLUCONATE CLOTH 2 % EX PADS: 6.0000 | MEDICATED_PAD | Freq: Every day | CUTANEOUS | Status: DC
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
## 2020-10-26 NOTE — Procedures (Signed)
   HEMODIALYSIS TREATMENT NOTE:  RIJ TDC occluded pre-dialysis.  Soiled, undated gauze bandage over catheter entry site.  Site is unremarkable.  Activase instilled x 60 minutes.  Patent right forearm AVG with palpable thrill and no signs of infection.  Anastomosis is completely healed.  Pt refuses cannulation, states he still has to f/u with vascular surgeon.  Dr. Luther Parody note of 6/27 indicates the AVG is safe for use.  Despite activase dwell, art cath port would not aspirate therefore HD was initiated with lines reversed.  Unstable arterial pressure prompted slower blood flows.  Average Qb 250.  Attempted to discuss potentially life-threatening complications of CRBSI but pt was resistant to discussion.  Asks for additional pain medication for chronic pain.    UF was limited by hypotension.  Net UF 789 cc.  All blood was returned.   Rockwell Alexandria, RN

## 2020-10-26 NOTE — ED Triage Notes (Signed)
States he has not had dialysis in 3 weeks because he got into a disagreement with the dialysis nurse, states he is having black stool for the past 3 days

## 2020-10-27 ENCOUNTER — Emergency Department (HOSPITAL_COMMUNITY)
Admission: EM | Admit: 2020-10-27 | Discharge: 2020-10-27 | Disposition: A | Discharge status: Home / Self Care | Payer: Medicaid Other | Attending: Emergency Medicine | Admitting: Emergency Medicine

## 2020-10-27 ENCOUNTER — Other Ambulatory Visit: Payer: Self-pay

## 2020-10-27 ENCOUNTER — Encounter (HOSPITAL_COMMUNITY): Payer: Self-pay | Admitting: Nephrology

## 2020-10-27 ENCOUNTER — Encounter (HOSPITAL_COMMUNITY): Payer: Self-pay | Admitting: *Deleted

## 2020-10-27 ENCOUNTER — Ambulatory Visit (HOSPITAL_COMMUNITY): Admission: RE | Admit: 2020-10-27 | Payer: Medicaid Other | Source: Ambulatory Visit

## 2020-10-27 DIAGNOSIS — N186 End stage renal disease: Secondary | ICD-10-CM | POA: Insufficient documentation

## 2020-10-27 DIAGNOSIS — E1140 Type 2 diabetes mellitus with diabetic neuropathy, unspecified: Secondary | ICD-10-CM | POA: Insufficient documentation

## 2020-10-27 DIAGNOSIS — Z20822 Contact with and (suspected) exposure to covid-19: Secondary | ICD-10-CM

## 2020-10-27 DIAGNOSIS — I132 Hypertensive heart and chronic kidney disease with heart failure and with stage 5 chronic kidney disease, or end stage renal disease: Secondary | ICD-10-CM | POA: Insufficient documentation

## 2020-10-27 DIAGNOSIS — I5042 Chronic combined systolic (congestive) and diastolic (congestive) heart failure: Secondary | ICD-10-CM | POA: Diagnosis not present

## 2020-10-27 DIAGNOSIS — Z794 Long term (current) use of insulin: Secondary | ICD-10-CM | POA: Insufficient documentation

## 2020-10-27 DIAGNOSIS — F17210 Nicotine dependence, cigarettes, uncomplicated: Secondary | ICD-10-CM | POA: Diagnosis not present

## 2020-10-27 DIAGNOSIS — Z992 Dependence on renal dialysis: Secondary | ICD-10-CM | POA: Insufficient documentation

## 2020-10-27 DIAGNOSIS — Z79899 Other long term (current) drug therapy: Secondary | ICD-10-CM | POA: Insufficient documentation

## 2020-10-27 DIAGNOSIS — Z1152 Encounter for screening for COVID-19: Secondary | ICD-10-CM | POA: Insufficient documentation

## 2020-10-27 DIAGNOSIS — Z8616 Personal history of COVID-19: Secondary | ICD-10-CM | POA: Insufficient documentation

## 2020-10-27 DIAGNOSIS — M791 Myalgia, unspecified site: Secondary | ICD-10-CM | POA: Insufficient documentation

## 2020-10-27 DIAGNOSIS — E1122 Type 2 diabetes mellitus with diabetic chronic kidney disease: Secondary | ICD-10-CM | POA: Diagnosis not present

## 2020-10-27 DIAGNOSIS — R059 Cough, unspecified: Secondary | ICD-10-CM | POA: Diagnosis not present

## 2020-10-27 DIAGNOSIS — R6883 Chills (without fever): Secondary | ICD-10-CM | POA: Insufficient documentation

## 2020-10-27 LAB — HEPATITIS B SURFACE ANTIGEN: Hepatitis B Surface Ag: NONREACTIVE

## 2020-10-27 NOTE — ED Triage Notes (Signed)
States he believes he has covid

## 2020-10-27 NOTE — ED Notes (Signed)
Pt no longer in room, screener states pt had left the dept.

## 2020-10-27 NOTE — ED Notes (Signed)
E sig pad not working in triage
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
## 2020-10-28 ENCOUNTER — Emergency Department (HOSPITAL_COMMUNITY)
Admission: EM | Admit: 2020-10-28 | Discharge: 2020-10-28 | Disposition: A | Discharge status: Home / Self Care | Payer: Medicaid Other

## 2020-10-30 ENCOUNTER — Observation Stay (HOSPITAL_COMMUNITY)
Admission: EM | Admit: 2020-10-30 | Discharge: 2020-10-31 | Disposition: A | Discharge status: Home / Self Care | Payer: Medicaid Other | Attending: Internal Medicine | Admitting: Internal Medicine

## 2020-10-30 ENCOUNTER — Emergency Department (HOSPITAL_COMMUNITY): Payer: Medicaid Other

## 2020-10-30 ENCOUNTER — Observation Stay (HOSPITAL_COMMUNITY): Payer: Medicaid Other

## 2020-10-30 ENCOUNTER — Encounter (HOSPITAL_COMMUNITY): Payer: Self-pay | Admitting: Emergency Medicine

## 2020-10-30 ENCOUNTER — Other Ambulatory Visit: Payer: Self-pay

## 2020-10-30 DIAGNOSIS — Z20822 Contact with and (suspected) exposure to covid-19: Secondary | ICD-10-CM | POA: Insufficient documentation

## 2020-10-30 DIAGNOSIS — F17210 Nicotine dependence, cigarettes, uncomplicated: Secondary | ICD-10-CM | POA: Diagnosis not present

## 2020-10-30 DIAGNOSIS — Z992 Dependence on renal dialysis: Secondary | ICD-10-CM

## 2020-10-30 DIAGNOSIS — I10 Essential (primary) hypertension: Secondary | ICD-10-CM | POA: Diagnosis not present

## 2020-10-30 DIAGNOSIS — I5042 Chronic combined systolic (congestive) and diastolic (congestive) heart failure: Secondary | ICD-10-CM | POA: Diagnosis present

## 2020-10-30 DIAGNOSIS — N186 End stage renal disease: Secondary | ICD-10-CM | POA: Diagnosis not present

## 2020-10-30 DIAGNOSIS — Z79899 Other long term (current) drug therapy: Secondary | ICD-10-CM | POA: Diagnosis not present

## 2020-10-30 DIAGNOSIS — I132 Hypertensive heart and chronic kidney disease with heart failure and with stage 5 chronic kidney disease, or end stage renal disease: Secondary | ICD-10-CM | POA: Diagnosis not present

## 2020-10-30 DIAGNOSIS — E1122 Type 2 diabetes mellitus with diabetic chronic kidney disease: Secondary | ICD-10-CM | POA: Insufficient documentation

## 2020-10-30 DIAGNOSIS — R1010 Upper abdominal pain, unspecified: Secondary | ICD-10-CM | POA: Diagnosis not present

## 2020-10-30 DIAGNOSIS — F17200 Nicotine dependence, unspecified, uncomplicated: Secondary | ICD-10-CM | POA: Diagnosis present

## 2020-10-30 DIAGNOSIS — Z89511 Acquired absence of right leg below knee: Secondary | ICD-10-CM

## 2020-10-30 DIAGNOSIS — E8770 Fluid overload, unspecified: Secondary | ICD-10-CM | POA: Diagnosis not present

## 2020-10-30 DIAGNOSIS — E8779 Other fluid overload: Secondary | ICD-10-CM | POA: Diagnosis not present

## 2020-10-30 DIAGNOSIS — I2510 Atherosclerotic heart disease of native coronary artery without angina pectoris: Secondary | ICD-10-CM | POA: Diagnosis not present

## 2020-10-30 DIAGNOSIS — R0600 Dyspnea, unspecified: Secondary | ICD-10-CM

## 2020-10-30 DIAGNOSIS — Z794 Long term (current) use of insulin: Secondary | ICD-10-CM | POA: Insufficient documentation

## 2020-10-30 DIAGNOSIS — Z8616 Personal history of COVID-19: Secondary | ICD-10-CM | POA: Diagnosis not present

## 2020-10-30 DIAGNOSIS — E1159 Type 2 diabetes mellitus with other circulatory complications: Secondary | ICD-10-CM

## 2020-10-30 DIAGNOSIS — R0602 Shortness of breath: Secondary | ICD-10-CM | POA: Diagnosis present

## 2020-10-30 LAB — GLUCOSE, CAPILLARY
Glucose-Capillary: 125 mg/dL — ABNORMAL HIGH (ref 70–99)
Glucose-Capillary: 107 mg/dL — ABNORMAL HIGH (ref 70–99)

## 2020-10-30 LAB — CBC WITH DIFFERENTIAL/PLATELET
Abs Immature Granulocytes: 0.03 10*3/uL (ref 0.00–0.07)
Basophils Absolute: 0 10*3/uL (ref 0.0–0.1)
Basophils Relative: 0 %
Eosinophils Absolute: 0.1 10*3/uL (ref 0.0–0.5)
Eosinophils Relative: 1 %
HCT: 32.1 % — ABNORMAL LOW (ref 39.0–52.0)
Hemoglobin: 10.5 g/dL — ABNORMAL LOW (ref 13.0–17.0)
Immature Granulocytes: 0 %
Lymphocytes Relative: 18 %
Lymphs Abs: 1.3 10*3/uL (ref 0.7–4.0)
MCH: 30.9 pg (ref 26.0–34.0)
MCHC: 32.7 g/dL (ref 30.0–36.0)
MCV: 94.4 fL (ref 80.0–100.0)
Monocytes Absolute: 0.6 10*3/uL (ref 0.1–1.0)
Monocytes Relative: 8 %
Neutro Abs: 5.2 10*3/uL (ref 1.7–7.7)
Neutrophils Relative %: 73 %
Platelets: 193 10*3/uL (ref 150–400)
RBC: 3.4 MIL/uL — ABNORMAL LOW (ref 4.22–5.81)
RDW: 13.8 % (ref 11.5–15.5)
WBC: 7.2 10*3/uL (ref 4.0–10.5)
nRBC: 0 % (ref 0.0–0.2)

## 2020-10-30 LAB — COMPREHENSIVE METABOLIC PANEL
ALT: 10 U/L (ref 0–44)
AST: 10 U/L — ABNORMAL LOW (ref 15–41)
Albumin: 3.2 g/dL — ABNORMAL LOW (ref 3.5–5.0)
Alkaline Phosphatase: 90 U/L (ref 38–126)
Anion gap: 9 (ref 5–15)
BUN: 71 mg/dL — ABNORMAL HIGH (ref 6–20)
CO2: 21 mmol/L — ABNORMAL LOW (ref 22–32)
Calcium: 7.1 mg/dL — ABNORMAL LOW (ref 8.9–10.3)
Chloride: 103 mmol/L (ref 98–111)
Creatinine, Ser: 6.23 mg/dL — ABNORMAL HIGH (ref 0.61–1.24)
GFR, Estimated: 10 mL/min — ABNORMAL LOW (ref >60)
Glucose, Bld: 297 mg/dL — ABNORMAL HIGH (ref 70–99)
Potassium: 3.9 mmol/L (ref 3.5–5.1)
Sodium: 133 mmol/L — ABNORMAL LOW (ref 135–145)
Total Bilirubin: 0.4 mg/dL (ref 0.3–1.2)
Total Protein: 7.8 g/dL (ref 6.5–8.1)

## 2020-10-30 LAB — RESP PANEL BY RT-PCR (FLU A&B, COVID) ARPGX2
Influenza A by PCR: NEGATIVE
Influenza B by PCR: NEGATIVE
SARS Coronavirus 2 by RT PCR: NEGATIVE

## 2020-10-30 LAB — LIPASE, BLOOD: Lipase: 35 U/L (ref 11–51)

## 2020-10-30 LAB — TROPONIN I (HIGH SENSITIVITY): Troponin I (High Sensitivity): 34 ng/L — ABNORMAL HIGH (ref <18)

## 2020-10-30 MED ORDER — ATORVASTATIN CALCIUM 20 MG PO TABS
20.0000 mg | ORAL_TABLET | Freq: Every day | ORAL | Status: DC
  Administered 2020-10-30: 20 mg via ORAL
  Filled 2020-10-30: qty 1

## 2020-10-30 MED ORDER — INSULIN GLARGINE 100 UNIT/ML ~~LOC~~ SOLN
10.0000 [IU] | Freq: Every day | SUBCUTANEOUS | Status: DC
  Administered 2020-10-30: 10 [IU] via SUBCUTANEOUS
  Filled 2020-10-30 (×3): qty 0.1

## 2020-10-30 MED ORDER — HEPARIN SODIUM (PORCINE) 5000 UNIT/ML IJ SOLN
5000.0000 [IU] | Freq: Three times a day (TID) | INTRAMUSCULAR | Status: DC
  Administered 2020-10-30: 5000 [IU] via SUBCUTANEOUS
  Filled 2020-10-30 (×2): qty 1

## 2020-10-30 MED ORDER — OXYCODONE-ACETAMINOPHEN 5-325 MG PO TABS
1.0000 | ORAL_TABLET | Freq: Once | ORAL | Status: AC
  Administered 2020-10-30: 1 via ORAL
  Filled 2020-10-30: qty 1

## 2020-10-30 MED ORDER — PANTOPRAZOLE SODIUM 40 MG PO TBEC
40.0000 mg | DELAYED_RELEASE_TABLET | Freq: Two times a day (BID) | ORAL | Status: DC
  Filled 2020-10-30: qty 1

## 2020-10-30 MED ORDER — OXYCODONE-ACETAMINOPHEN 5-325 MG PO TABS
1.0000 | ORAL_TABLET | Freq: Four times a day (QID) | ORAL | Status: DC | PRN
  Administered 2020-10-30 – 2020-10-31 (×2): 1 via ORAL
  Filled 2020-10-30 (×3): qty 1

## 2020-10-30 MED ORDER — PROCHLORPERAZINE EDISYLATE 10 MG/2ML IJ SOLN: 10.0000 mg | Freq: Four times a day (QID) | INTRAMUSCULAR | Status: DC | PRN

## 2020-10-30 MED ORDER — INSULIN ASPART 100 UNIT/ML IJ SOLN: 0.0000 [IU] | Freq: Every day | INTRAMUSCULAR | Status: DC

## 2020-10-30 MED ORDER — INSULIN ASPART 100 UNIT/ML IJ SOLN: 0.0000 [IU] | Freq: Three times a day (TID) | INTRAMUSCULAR | Status: DC

## 2020-10-30 MED ORDER — AMLODIPINE BESYLATE 5 MG PO TABS
5.0000 mg | ORAL_TABLET | Freq: Every day | ORAL | Status: DC
  Filled 2020-10-30: qty 1

## 2020-10-30 MED ORDER — CHLORHEXIDINE GLUCONATE CLOTH 2 % EX PADS
6.0000 | MEDICATED_PAD | Freq: Every day | CUTANEOUS | Status: DC
  Administered 2020-10-31: 6 via TOPICAL

## 2020-10-30 NOTE — ED Triage Notes (Signed)
Pt presents today c/o N,V,intermittent chest pain since Tuesday . Is a HD pt, hasn't had HD since Monday. Reports SHOB, states feels like he has a lot of fluid on him. Pt states that he was banned from his dialysis center and was supposed to go to [facility] yesterday but didn't have a ride there.
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
## 2020-10-30 NOTE — H&P (Signed)
History and Physical  Zachary Young [id number] DOB: Nov 02, 1960 DOA: 10/30/2020   PCP: Mellissa Kohut, DO   Patient coming from: Home  Chief Complaint: cp, sob, n/v  HPI:  Zachary Young is a 60 y.o. male with medical history significant for pancreatitis, ESRD on HD MWF [facility], [place], s/p right BKA, poorly controlled diabetes mellitus type 2, hypertension, hyperlipidemia, biventricular heart failure, CAD, osteomyelitis s/p R-BKA, CAD, tobacco and cocaine use, s/p right arm AV fistula with chronic arm pain afterwards, to ED complaining of fistula pain and missed several days of HD.  He was dismissed from [facility] due to lashing out at a nurse and swatting her hand away when she was trying to draw blood from his arm.  Social work has confirmed that he was involuntarily discharged from their facility and now he has no place to receive hemodialysis except for a hospital setting.   The patient presents with chest pain, shortness of breath, nausea, vomiting the last 4 to 5 days.  He states that he has not had dialysis since 10/24/2020.  However review of the medical record shows that the patient has been to the emergency department on many occasions and offered dialysis many times of which he refuses and leaves [place].  Most recently, the patient was in the emergency department on 10/26/2020.  He was offered dialysis, but refused for his fistula to be accessed.  Ultrafiltration was done on that day removing 789 cc of fluid.  He denies any fevers, chills, headache, neck pain, cough, hemoptysis.  He complains of chest pain lasting about 20 to 30 minutes intermittently for the past 4 days.  He has some associated shortness of breath with nausea and vomiting.  There is no hematemesis, hematochezia, melena.  He has chronic abdominal pain, he states that this has not changed. The patient was recently hospitalized from 09/26/2020 to 09/28/2020 where he was noted to have  positive cocaine urine drug test.  During that admission, he was noted to be incidentally COVID positive.  He was treated with monoclonal antibody.   Upon asking the patient if he drinks any alcohol or uses any cocaine he became irate and began to yell ambulate me. Subsequently, he asked me if he could have additional Percocet. In the emergency department, the patient was afebrile hemodynamically stable with oxygen saturation 96% room air.  BMP showed potassium 3.9, bicarbonate 21, and serum creatinine 6.23.  LFTs were unremarkable.  WBC 7.2, hemoglobin 7.5, platelets 193,000.  Chest x-ray showed interstitial edema.  Nephrology was contacted and stated that the patient will be offered dialysis on 10/31/2020.    Assessment/Plan: Fluid overload/ESRD -normally dialyzed MWF -Nephrology consulted--> plan for dialysis 10/31/2020 -Stable on room air -Last had ultrafiltration on 10/26/2020 removing 789 cc -Patient has been dismissed by his dialysis center and has to come to the ED intermittently to receive dialysis  Nausea/vomiting/abdominal pain -Check lipase -PRN antiemetic -CT abd/pelvis -start PPI  Chest pain -cycle troponins -Echo  Uncontrolled diabetes mellitus type 2 with hyperglycemia -08/21/20 A1C-7.8 -novolog sliding scale -start reduced dose lantus  Mixed hyperlipidemia -LDL 148 -Triglycerides 179 -Total cholesterol 223 -continue statin -above was checked on 0000000   Chronic systolic and diastolic CHF -The patient does appear hypervolemic, but suspect this is related to his ESRD and missing dialysis -08/16/19 echo EF 50-55%, no WMA, trivial TR   Bronchiectasis/tobacco abuse -No wheezing on exam -Tobacco cessation discussed -Stable on room air -Personally reviewed chest x-ray--increased interstitial  markings -repeat UDS   Hypertension -SBP consistently in 160s to 180s -continue amlodipine   Right BKA status -has chronic phantom limb pain and restarted on home  medications.        Past Medical History:  Diagnosis Date   CHF (congestive heart failure) ([place])    Diabetes mellitus without complication ([place])    Dialysis patient [facility])    Peripheral edema    Renal disorder    kidney disease   Past Surgical History:  Procedure Laterality Date   AMPUTATION Right 10/21/2019   Procedure: RIGHT BELOW KNEE AMPUTATION;  Surgeon: Newt Minion, MD;  Location: [place];  Service: Orthopedics;  Laterality: Right;   AV FISTULA PLACEMENT Left 11/13/2019   Procedure: LEFT ARTERIOVENOUS (AV) FISTULA VERSUS ARTERIOVENOUS GRAFT;  Surgeon: Rosetta Posner, MD;  Location: [facility];  Service: Vascular;  Laterality: Left;   AV FISTULA PLACEMENT Left 04/05/2020   Procedure: INSERTION OF LEFT ARM ARTERIOVENOUS (AV) GORE-TEX GRAFT;  Surgeon: Rosetta Posner, MD;  Location: [facility];  Service: Vascular;  Laterality: Left;   AV FISTULA PLACEMENT Right 07/14/2020   Procedure: RIGHT ARM ARTERIOVENOUS (AV) GRAFT CREATION;  Surgeon: Rosetta Posner, MD;  Location: [facility];  Service: Vascular;  Laterality: Right;   [place] Left 01/27/2020   Procedure: LEFT ARM 2ND STAGE [place];  Surgeon: Rosetta Posner, MD;  Location: [place];  Service: Vascular;  Laterality: Left;   DIALYSIS/PERMA CATHETER INSERTION N/A 03/03/2020   Procedure: DIALYSIS/PERMA CATHETER INSERTION;  Surgeon: Algernon Huxley, MD;  Location: [facility] [facility];  Service: Cardiovascular;  Laterality: N/A;   ESOPHAGOGASTRODUODENOSCOPY (EGD) WITH PROPOFOL N/A 06/17/2020   gastritis, normal duodenum.    INSERTION OF DIALYSIS CATHETER N/A 04/05/2020   Procedure: INSERTION OF TUNNEL  DIALYSIS CATHETER LEFT INTERNAL JUGULAR;  Surgeon: Rosetta Posner, MD;  [facility];  Service: Vascular;  Laterality: N/A;   IR FLUORO GUIDE CV LINE RIGHT  11/05/2019   IR FLUORO GUIDE CV LINE RIGHT  06/07/2020   IR REMOVAL TUN CV CATH W/O FL  06/01/2020   IR THROMBECTOMY AV FISTULA W/THROMBOLYSIS/PTA INC/SHUNT/IMG LEFT  Left 06/06/2020   IR US GUIDE VASC ACCESS LEFT  06/06/2020   IR US GUIDE VASC ACCESS RIGHT  11/05/2019   IR US GUIDE VASC ACCESS RIGHT  06/07/2020   REMOVAL OF A DIALYSIS CATHETER Right 04/05/2020   Procedure: REMOVAL OF RIGHT CHEST DIALYSIS CATHETER;  Surgeon: Rosetta Posner, MD;  Location[facility];  Service: Vascular;  Laterality: Right;   TOE AMPUTATION Bilateral    due to osteomyelitis, all 5 each foot   Social History:  reports that he has been smoking cigarettes. He has been smoking an average of .5 packs per day. He has never used smokeless tobacco. He reports previous alcohol use. He reports previous drug use. Drug: Cocaine.   Family History  Problem Relation Age of Onset   Cancer Mother        uknown type of cancer   Colon cancer Neg Hx    Colon polyps Neg Hx      Allergies  Allergen Reactions   Bee Venom Anaphylaxis    Per pt, nearly died from bee sting as a child   Propofol Other (See Comments)     Hallucinations   Eggs Or Egg-Derived Products Nausea And Vomiting and Other (See Comments)    Stomach cramps in large amounts   Morphine Nausea And Vomiting   Oxycodone Nausea And  Vomiting     Prior to Admission medications   Medication Sig Start Date End Date Taking? Authorizing Provider  amLODipine (NORVASC) 5 MG tablet Take 1 tablet (5 mg total) by mouth daily. 06/28/20   Orson Eva, MD  atorvastatin (LIPITOR) 20 MG tablet Take 1 tablet (20 mg total) by mouth daily with supper. 06/28/20   Orson Eva, MD  dicyclomine (BENTYL) 20 MG tablet Take 1 tablet (20 mg total) by mouth 2 (two) times daily. 07/22/20   Margarita Mail, PA-C  ergocalciferol (VITAMIN D2) 1.25 MG (50000 UT) capsule Take 1 capsule (50,000 Units total) by mouth every Friday. 12/11/19   Samella Parr, NP  insulin glargine (LANTUS) 100 UNIT/ML injection Inject 0.35 mLs (35 Units total) into the skin at bedtime. Patient taking differently: Inject 25 Units into the skin at bedtime. 12/11/19   Samella Parr, NP   lidocaine-prilocaine (EMLA) cream Apply 1 application topically as needed (port site).    [provider]  NOVOLOG FLEXPEN 100 UNIT/ML FlexPen Inject 4 Units into the skin 3 (three) times daily with meals. 05/24/20   [provider]  oxyCODONE-acetaminophen (PERCOCET) 5-325 MG tablet Take 1 tablet by mouth every 6 (six) hours as needed. Patient taking differently: Take 1 tablet by mouth every 6 (six) hours as needed for moderate pain. 08/16/20   Milton Ferguson, MD  pantoprazole (PROTONIX) 40 MG tablet Take 1 tablet (40 mg total) by mouth 2 (two) times daily before a meal. 08/02/20   Annitta Needs, NP  phenytoin (DILANTIN) 100 MG ER capsule Take 3 capsules (300 mg total) by mouth at bedtime. 06/15/20   Garvin Fila, MD  sucralfate (CARAFATE) 1 g tablet Take 1 tablet (1 g total) by mouth 4 (four) times daily -  with meals and at bedtime. 07/22/20   Margarita Mail, PA-C  torsemide (DEMADEX) 100 MG tablet Take 1 tablet (100 mg total) by mouth daily. On Monday-Wednesday-Friday-Sunday (non-dialysis days) Patient taking differently: Take 100 mg by mouth 4 (four) times a week. On Monday-Wednesday-Friday-Sunday (non-dialysis days) 06/28/20   Sylvan Lahm, Shanon Brow, MD    Review of Systems:  Constitutional:  No weight loss, night sweats, Fevers, chills, fatigue.  Head&Eyes: No headache.  No vision loss.  No eye pain or scotoma ENT:  No Difficulty swallowing,Tooth/dental problems,Sore throat,  No ear ache, post nasal drip,  Cardio-vascular:  No chest pain, Orthopnea, PND, swelling in lower extremities,  dizziness, palpitations  GI:  No  diarrhea, loss of appetite, hematochezia, melena, heartburn, indigestion, Resp:  No shortness of breath with exertion or at rest. No cough. No coughing up of blood .No wheezing.No chest wall deformity  Skin:  no rash or lesions.  GU:  no dysuria, change in color of urine, no urgency or frequency. No flank pain.  Musculoskeletal:  No joint pain or swelling. No  decreased range of motion. No back pain.  Psych:  No change in mood or affect. No depression or anxiety. Neurologic: No headache, no dysesthesia, no focal weakness, no vision loss. No syncope  Physical Exam: Vitals:   10/30/20 1150 10/30/20 1245 10/30/20 1329 10/30/20 1430  BP: (!) 155/77 (!) 165/87 (!) 184/100 (!) 150/95  Pulse: 81 80 94 80  Resp: (!) 23 20 (!) 22 (!) 22  Temp:      SpO2: 97% 99% 96% 95%  Weight:      Height:       General:  A&O x 3, NAD, nontoxic, pleasant/cooperative Head/Eye: No conjunctival hemorrhage, no icterus,  [place]/AT, No nystagmus ENT:  No icterus,  No thrush, good dentition, no pharyngeal exudate Neck:  No masses, no lymphadenpathy, no bruits CV:  RRR, no rub, no gallop, no S3 Lung:  bibasilar crackles. No wheeze Abdomen: soft/NT, +BS, nondistended, no peritoneal signs Ext: No cyanosis, No rashes, No petechiae, No lymphangitis, No edema Neuro: CNII-XII intact, strength 4/5 in bilateral upper and lower extremities, no dysmetria  Labs on Admission:  Basic Metabolic Panel: Recent Labs  Lab 10/26/20 1900 10/30/20 1119  NA 134* 133*  K 4.0 3.9  CL 102 103  CO2 20* 21*  GLUCOSE 190* 297*  BUN 97* 71*  CREATININE 7.74* 6.23*  CALCIUM 6.6* 7.1*  MG 1.3*  --   PHOS 8.5*  --    Liver Function Tests: Recent Labs  Lab 10/26/20 1900 10/30/20 1119  AST 11* 10*  ALT 12 10  ALKPHOS 101 90  BILITOT 0.2* 0.4  PROT 7.2 7.8  ALBUMIN 3.2* 3.2*   No results for input(s): LIPASE, AMYLASE in the last 168 hours. No results for input(s): AMMONIA in the last 168 hours. CBC: Recent Labs  Lab 10/26/20 1900 10/30/20 1119  WBC 7.7 7.2  NEUTROABS 5.0 5.2  HGB 10.5* 10.5*  HCT 30.9* 32.1*  MCV 92.8 94.4  PLT 220 193   Coagulation Profile: Recent Labs  Lab 10/26/20 1900  INR 1.0   Cardiac Enzymes: No results for input(s): CKTOTAL, CKMB, CKMBINDEX, TROPONINI in the last 168 hours. BNP: Invalid input(s): POCBNP CBG: No results for input(s):  GLUCAP in the last 168 hours. Urine analysis:    Component Value Date/Time   COLORURINE STRAW (A) 08/22/2020 0957   APPEARANCEUR CLEAR 08/22/2020 0957   LABSPEC 1.006 08/22/2020 0957   PHURINE 6.0 08/22/2020 0957   GLUCOSEU 50 (A) 08/22/2020 0957   HGBUR SMALL (A) 08/22/2020 0957   BILIRUBINUR NEGATIVE 08/22/2020 0957   KETONESUR NEGATIVE 08/22/2020 0957   PROTEINUR 100 (A) 08/22/2020 0957   NITRITE NEGATIVE 08/22/2020 0957   LEUKOCYTESUR NEGATIVE 08/22/2020 0957   Sepsis Labs: '@LABRCNTIP'$ (procalcitonin:4,lacticidven:4) ) Recent Results (from the past 240 hour(s))  Resp Panel by RT-PCR (Flu A&B, Covid) Nasopharyngeal Swab     Status: None   Collection Time: 10/26/20  6:00 PM   Specimen: Nasopharyngeal Swab; Nasopharyngeal(NP) swabs in vial transport medium  Result Value Ref Range Status   SARS Coronavirus 2 by RT PCR NEGATIVE NEGATIVE Final    Comment: (NOTE) SARS-CoV-2 target nucleic acids are NOT DETECTED.  The SARS-CoV-2 RNA is generally detectable in upper respiratory specimens during the acute phase of infection. The lowest concentration of SARS-CoV-2 viral copies this assay can detect is 138 copies/mL. A negative result does not preclude SARS-Cov-2 infection and should not be used as the sole basis for treatment or other patient management decisions. A negative result may occur with  improper specimen collection/handling, submission of specimen other than nasopharyngeal swab, presence of viral mutation(s) within the areas targeted by this assay, and inadequate number of viral copies(<138 copies/mL). A negative result must be combined with clinical observations, patient history, and epidemiological information. The expected result is Negative.  Fact Sheet for Patients:  EntrepreneurPulse.com.au  Fact Sheet for Healthcare Providers:  IncredibleEmployment.be  This test is no t yet approved or cleared by the [place] FDA and   has been authorized for detection and/or diagnosis of SARS-CoV-2 by FDA under an Emergency Use Authorization (EUA). This EUA will remain  in effect (meaning this test can be used) for the duration of the  COVID-19 declaration under Section 564(b)(1) of the Act, 21 U.S.C.section 360bbb-3(b)(1), unless the authorization is terminated  or revoked sooner.       Influenza A by PCR NEGATIVE NEGATIVE Final   Influenza B by PCR NEGATIVE NEGATIVE Final    Comment: (NOTE) The Xpert Xpress SARS-CoV-2/FLU/RSV plus assay is intended as an aid in the diagnosis of influenza from Nasopharyngeal swab specimens and should not be used as a sole basis for treatment. Nasal washings and aspirates are unacceptable for Xpert Xpress SARS-CoV-2/FLU/RSV testing.  Fact Sheet for Patients: EntrepreneurPulse.com.au  Fact Sheet for Healthcare Providers: IncredibleEmployment.be  This test is not yet approved or cleared by the [place] FDA and has been authorized for detection and/or diagnosis of SARS-CoV-2 by FDA under an Emergency Use Authorization (EUA). This EUA will remain in effect (meaning this test can be used) for the duration of the COVID-19 declaration under Section 564(b)(1) of the Act, 21 U.S.C. section 360bbb-3(b)(1), unless the authorization is terminated or revoked.  Performed at [facility], [address], [place], [place] [address]   Resp Panel by RT-PCR (Flu A&B, Covid) Nasopharyngeal Swab     Status: None   Collection Time: 10/30/20 11:06 AM   Specimen: Nasopharyngeal Swab; Nasopharyngeal(NP) swabs in vial transport medium  Result Value Ref Range Status   SARS Coronavirus 2 by RT PCR NEGATIVE NEGATIVE Final    Comment: (NOTE) SARS-CoV-2 target nucleic acids are NOT DETECTED.  The SARS-CoV-2 RNA is generally detectable in upper respiratory specimens during the acute phase of infection. The lowest concentration of SARS-CoV-2 viral copies this  assay can detect is 138 copies/mL. A negative result does not preclude SARS-Cov-2 infection and should not be used as the sole basis for treatment or other patient management decisions. A negative result may occur with  improper specimen collection/handling, submission of specimen other than nasopharyngeal swab, presence of viral mutation(s) within the areas targeted by this assay, and inadequate number of viral copies(<138 copies/mL). A negative result must be combined with clinical observations, patient history, and epidemiological information. The expected result is Negative.  Fact Sheet for Patients:  EntrepreneurPulse.com.au  Fact Sheet for Healthcare Providers:  IncredibleEmployment.be  This test is no t yet approved or cleared by the [place] FDA and  has been authorized for detection and/or diagnosis of SARS-CoV-2 by FDA under an Emergency Use Authorization (EUA). This EUA will remain  in effect (meaning this test can be used) for the duration of the COVID-19 declaration under Section 564(b)(1) of the Act, 21 U.S.C.section 360bbb-3(b)(1), unless the authorization is terminated  or revoked sooner.       Influenza A by PCR NEGATIVE NEGATIVE Final   Influenza B by PCR NEGATIVE NEGATIVE Final    Comment: (NOTE) The Xpert Xpress SARS-CoV-2/FLU/RSV plus assay is intended as an aid in the diagnosis of influenza from Nasopharyngeal swab specimens and should not be used as a sole basis for treatment. Nasal washings and aspirates are unacceptable for Xpert Xpress SARS-CoV-2/FLU/RSV testing.  Fact Sheet for Patients: EntrepreneurPulse.com.au  Fact Sheet for Healthcare Providers: IncredibleEmployment.be  This test is not yet approved or cleared by the [place] FDA and has been authorized for detection and/or diagnosis of SARS-CoV-2 by FDA under an Emergency Use Authorization (EUA). This EUA will  remain in effect (meaning this test can be used) for the duration of the COVID-19 declaration under Section 564(b)(1) of the Act, 21 U.S.C. section 360bbb-3(b)(1), unless the authorization is terminated or revoked.  Performed at [facility],  [place], [place] [address]      Radiological Exams on Admission: DG Chest Port 1 View  Result Date: 10/30/2020 CLINICAL DATA:  Shortness of breath, chest pain EXAM: PORTABLE CHEST 1 VIEW COMPARISON:  10/26/2020 FINDINGS: Right-sided hemodialysis catheter terminates at the level of the superior cavoatrial junction. Stable mild cardiomegaly. Mild diffuse interstitial prominence is similar to prior. No focal airspace consolidation. No pleural effusion or pneumothorax. IMPRESSION: Mild cardiomegaly with mild diffuse interstitial prominence which may reflect interstitial edema. Electronically Signed   By: Davina Poke D.O.   On: 10/30/2020 12:02    EKG: Independently reviewed. Sinus, nonspecific T wave changes    Time spent:60 minutes Code Status:   FULL Family Communication:  No Family at bedside Disposition Plan: expect 1-2 day hospitalization Consults called: renal  DVT Prophylaxis: [place] Heparin     Orson Eva, DO  [facility] Pager [phone]  If 7PM-7AM, please contact night-coverage [email or web address] Password [facility] 10/30/2020, 3:03 PM

## 2020-10-31 ENCOUNTER — Observation Stay (HOSPITAL_COMMUNITY): Payer: Medicaid Other

## 2020-10-31 DIAGNOSIS — E8770 Fluid overload, unspecified: Secondary | ICD-10-CM | POA: Diagnosis not present

## 2020-10-31 DIAGNOSIS — N186 End stage renal disease: Secondary | ICD-10-CM | POA: Diagnosis not present

## 2020-10-31 DIAGNOSIS — Z992 Dependence on renal dialysis: Secondary | ICD-10-CM | POA: Diagnosis not present

## 2020-10-31 LAB — CBC
HCT: 30.7 % — ABNORMAL LOW (ref 39.0–52.0)
Hemoglobin: 9.8 g/dL — ABNORMAL LOW (ref 13.0–17.0)
MCH: 29.9 pg (ref 26.0–34.0)
MCHC: 31.9 g/dL (ref 30.0–36.0)
MCV: 93.6 fL (ref 80.0–100.0)
Platelets: 201 10*3/uL (ref 150–400)
RBC: 3.28 MIL/uL — ABNORMAL LOW (ref 4.22–5.81)
RDW: 13.8 % (ref 11.5–15.5)
WBC: 6.5 10*3/uL (ref 4.0–10.5)
nRBC: 0 % (ref 0.0–0.2)

## 2020-10-31 LAB — GLUCOSE, CAPILLARY: Glucose-Capillary: 142 mg/dL — ABNORMAL HIGH (ref 70–99)

## 2020-10-31 LAB — HIV ANTIBODY (ROUTINE TESTING W REFLEX): HIV Screen 4th Generation wRfx: NONREACTIVE

## 2020-11-02 ENCOUNTER — Emergency Department (HOSPITAL_COMMUNITY): Payer: Medicaid Other

## 2020-11-02 ENCOUNTER — Observation Stay (HOSPITAL_COMMUNITY)
Admission: EM | Admit: 2020-11-02 | Discharge: 2020-11-03 | Disposition: A | Discharge status: Home / Self Care | Payer: Medicaid Other | Attending: Family Medicine | Admitting: Family Medicine

## 2020-11-02 ENCOUNTER — Encounter (HOSPITAL_COMMUNITY): Payer: Self-pay | Admitting: *Deleted

## 2020-11-02 ENCOUNTER — Other Ambulatory Visit: Payer: Self-pay

## 2020-11-02 DIAGNOSIS — F1910 Other psychoactive substance abuse, uncomplicated: Secondary | ICD-10-CM | POA: Insufficient documentation

## 2020-11-02 DIAGNOSIS — Z992 Dependence on renal dialysis: Secondary | ICD-10-CM | POA: Insufficient documentation

## 2020-11-02 DIAGNOSIS — R0600 Dyspnea, unspecified: Secondary | ICD-10-CM

## 2020-11-02 DIAGNOSIS — I2510 Atherosclerotic heart disease of native coronary artery without angina pectoris: Secondary | ICD-10-CM | POA: Insufficient documentation

## 2020-11-02 DIAGNOSIS — F17210 Nicotine dependence, cigarettes, uncomplicated: Secondary | ICD-10-CM | POA: Insufficient documentation

## 2020-11-02 DIAGNOSIS — I132 Hypertensive heart and chronic kidney disease with heart failure and with stage 5 chronic kidney disease, or end stage renal disease: Secondary | ICD-10-CM | POA: Diagnosis not present

## 2020-11-02 DIAGNOSIS — Z20822 Contact with and (suspected) exposure to covid-19: Secondary | ICD-10-CM | POA: Insufficient documentation

## 2020-11-02 DIAGNOSIS — Z8616 Personal history of COVID-19: Secondary | ICD-10-CM | POA: Diagnosis not present

## 2020-11-02 DIAGNOSIS — Z79899 Other long term (current) drug therapy: Secondary | ICD-10-CM | POA: Diagnosis not present

## 2020-11-02 DIAGNOSIS — R0602 Shortness of breath: Secondary | ICD-10-CM | POA: Diagnosis present

## 2020-11-02 DIAGNOSIS — E1122 Type 2 diabetes mellitus with diabetic chronic kidney disease: Secondary | ICD-10-CM | POA: Insufficient documentation

## 2020-11-02 DIAGNOSIS — N186 End stage renal disease: Principal | ICD-10-CM | POA: Insufficient documentation

## 2020-11-02 DIAGNOSIS — E785 Hyperlipidemia, unspecified: Secondary | ICD-10-CM | POA: Diagnosis present

## 2020-11-02 DIAGNOSIS — R609 Edema, unspecified: Secondary | ICD-10-CM

## 2020-11-02 DIAGNOSIS — Z794 Long term (current) use of insulin: Secondary | ICD-10-CM | POA: Diagnosis not present

## 2020-11-02 DIAGNOSIS — I5042 Chronic combined systolic (congestive) and diastolic (congestive) heart failure: Secondary | ICD-10-CM | POA: Insufficient documentation

## 2020-11-02 LAB — CBC WITH DIFFERENTIAL/PLATELET
Abs Immature Granulocytes: 0.03 10*3/uL (ref 0.00–0.07)
Basophils Absolute: 0 10*3/uL (ref 0.0–0.1)
Basophils Relative: 0 %
Eosinophils Absolute: 0.1 10*3/uL (ref 0.0–0.5)
Eosinophils Relative: 1 %
HCT: 31.4 % — ABNORMAL LOW (ref 39.0–52.0)
Hemoglobin: 10.6 g/dL — ABNORMAL LOW (ref 13.0–17.0)
Immature Granulocytes: 0 %
Lymphocytes Relative: 18 %
Lymphs Abs: 1.4 10*3/uL (ref 0.7–4.0)
MCH: 31.3 pg (ref 26.0–34.0)
MCHC: 33.8 g/dL (ref 30.0–36.0)
MCV: 92.6 fL (ref 80.0–100.0)
Monocytes Absolute: 0.6 10*3/uL (ref 0.1–1.0)
Monocytes Relative: 8 %
Neutro Abs: 5.6 10*3/uL (ref 1.7–7.7)
Neutrophils Relative %: 73 %
Platelets: 243 10*3/uL (ref 150–400)
RBC: 3.39 MIL/uL — ABNORMAL LOW (ref 4.22–5.81)
RDW: 13.8 % (ref 11.5–15.5)
WBC: 7.8 10*3/uL (ref 4.0–10.5)
nRBC: 0 % (ref 0.0–0.2)

## 2020-11-02 LAB — BASIC METABOLIC PANEL
Anion gap: 11 (ref 5–15)
BUN: 77 mg/dL — ABNORMAL HIGH (ref 6–20)
CO2: 20 mmol/L — ABNORMAL LOW (ref 22–32)
Calcium: 7 mg/dL — ABNORMAL LOW (ref 8.9–10.3)
Chloride: 101 mmol/L (ref 98–111)
Creatinine, Ser: 6.65 mg/dL — ABNORMAL HIGH (ref 0.61–1.24)
GFR, Estimated: 9 mL/min — ABNORMAL LOW (ref >60)
Glucose, Bld: 244 mg/dL — ABNORMAL HIGH (ref 70–99)
Potassium: 4.3 mmol/L (ref 3.5–5.1)
Sodium: 132 mmol/L — ABNORMAL LOW (ref 135–145)

## 2020-11-02 LAB — TROPONIN I (HIGH SENSITIVITY): Troponin I (High Sensitivity): 26 ng/L — ABNORMAL HIGH (ref <18)

## 2020-11-02 MED ORDER — POLYETHYLENE GLYCOL 3350 17 G PO PACK: 17.0000 g | PACK | Freq: Every day | ORAL | Status: DC | PRN

## 2020-11-02 MED ORDER — OXYCODONE-ACETAMINOPHEN 5-325 MG PO TABS
2.0000 | ORAL_TABLET | Freq: Once | ORAL | Status: AC
  Administered 2020-11-02: 2 via ORAL
  Filled 2020-11-02: qty 2

## 2020-11-02 MED ORDER — INSULIN GLARGINE 100 UNIT/ML ~~LOC~~ SOLN
25.0000 [IU] | Freq: Every day | SUBCUTANEOUS | Status: DC
  Filled 2020-11-02 (×2): qty 0.25

## 2020-11-02 MED ORDER — OXYCODONE-ACETAMINOPHEN 5-325 MG PO TABS
1.0000 | ORAL_TABLET | Freq: Four times a day (QID) | ORAL | Status: DC | PRN
  Administered 2020-11-03: 1 via ORAL
  Filled 2020-11-02: qty 1

## 2020-11-02 MED ORDER — HEPARIN SODIUM (PORCINE) 5000 UNIT/ML IJ SOLN: 5000.0000 [IU] | Freq: Three times a day (TID) | INTRAMUSCULAR | Status: DC

## 2020-11-02 MED ORDER — PANTOPRAZOLE SODIUM 40 MG PO TBEC
40.0000 mg | DELAYED_RELEASE_TABLET | Freq: Two times a day (BID) | ORAL | Status: DC
  Filled 2020-11-02: qty 1

## 2020-11-02 MED ORDER — ONDANSETRON HCL 4 MG/2ML IJ SOLN: 4.0000 mg | Freq: Three times a day (TID) | INTRAMUSCULAR | Status: DC | PRN

## 2020-11-02 MED ORDER — AMLODIPINE BESYLATE 5 MG PO TABS: 5.0000 mg | ORAL_TABLET | Freq: Every day | ORAL | Status: DC

## 2020-11-02 MED ORDER — ACETAMINOPHEN 650 MG RE SUPP: 650.0000 mg | Freq: Four times a day (QID) | RECTAL | Status: DC | PRN

## 2020-11-02 MED ORDER — ATORVASTATIN CALCIUM 20 MG PO TABS: 20.0000 mg | ORAL_TABLET | Freq: Every day | ORAL | Status: DC

## 2020-11-02 MED ORDER — INSULIN ASPART 100 UNIT/ML IJ SOLN
0.0000 [IU] | Freq: Three times a day (TID) | INTRAMUSCULAR | Status: DC
Start: 2020-11-03 — End: 2020-11-03

## 2020-11-02 MED ORDER — PHENYTOIN SODIUM EXTENDED 100 MG PO CAPS
300.0000 mg | ORAL_CAPSULE | Freq: Every day | ORAL | Status: DC
  Administered 2020-11-03: 300 mg via ORAL
  Filled 2020-11-02: qty 3

## 2020-11-02 MED ORDER — ACETAMINOPHEN 325 MG PO TABS: 650.0000 mg | ORAL_TABLET | Freq: Four times a day (QID) | ORAL | Status: DC | PRN

## 2020-11-02 MED ORDER — INSULIN ASPART 100 UNIT/ML ~~LOC~~ SOLN: 4.0000 [IU] | Freq: Three times a day (TID) | SUBCUTANEOUS | Status: DC

## 2020-11-02 MED ORDER — INSULIN ASPART 100 UNIT/ML IJ SOLN: 0.0000 [IU] | Freq: Every day | INTRAMUSCULAR | Status: DC

## 2020-11-02 MED ORDER — OXYCODONE-ACETAMINOPHEN 5-325 MG PO TABS: 1.0000 | ORAL_TABLET | Freq: Once | ORAL | Status: DC
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
## 2020-11-02 NOTE — ED Triage Notes (Signed)
Pt c/o chest pain for the last 3 days with sob; pt states he feels like he has fluid built up on him

## 2020-11-02 NOTE — Progress Notes (Signed)
Patient arrived to floor.  Patient refuses initial assessment, skin assessment, lab work for troponin levels, and to wear telemetry  monitor. MD notified of patient behavior.  No new orders received.

## 2020-11-03 ENCOUNTER — Observation Stay (HOSPITAL_COMMUNITY): Payer: Medicaid Other

## 2020-11-03 DIAGNOSIS — E782 Mixed hyperlipidemia: Secondary | ICD-10-CM

## 2020-11-03 DIAGNOSIS — Z992 Dependence on renal dialysis: Secondary | ICD-10-CM

## 2020-11-03 DIAGNOSIS — I5042 Chronic combined systolic (congestive) and diastolic (congestive) heart failure: Secondary | ICD-10-CM

## 2020-11-03 DIAGNOSIS — Z5329 Procedure and treatment not carried out because of patient's decision for other reasons: Secondary | ICD-10-CM

## 2020-11-03 LAB — COMPREHENSIVE METABOLIC PANEL
ALT: 10 U/L (ref 0–44)
AST: 11 U/L — ABNORMAL LOW (ref 15–41)
Albumin: 3.2 g/dL — ABNORMAL LOW (ref 3.5–5.0)
Alkaline Phosphatase: 106 U/L (ref 38–126)
Anion gap: 11 (ref 5–15)
BUN: 81 mg/dL — ABNORMAL HIGH (ref 6–20)
CO2: 21 mmol/L — ABNORMAL LOW (ref 22–32)
Calcium: 7.2 mg/dL — ABNORMAL LOW (ref 8.9–10.3)
Chloride: 102 mmol/L (ref 98–111)
Creatinine, Ser: 6.81 mg/dL — ABNORMAL HIGH (ref 0.61–1.24)
GFR, Estimated: 9 mL/min — ABNORMAL LOW (ref >60)
Glucose, Bld: 222 mg/dL — ABNORMAL HIGH (ref 70–99)
Potassium: 4.8 mmol/L (ref 3.5–5.1)
Sodium: 134 mmol/L — ABNORMAL LOW (ref 135–145)
Total Bilirubin: 0.4 mg/dL (ref 0.3–1.2)
Total Protein: 7.9 g/dL (ref 6.5–8.1)

## 2020-11-03 LAB — CBC
HCT: 32.4 % — ABNORMAL LOW (ref 39.0–52.0)
Hemoglobin: 10.4 g/dL — ABNORMAL LOW (ref 13.0–17.0)
MCH: 30.1 pg (ref 26.0–34.0)
MCHC: 32.1 g/dL (ref 30.0–36.0)
MCV: 93.9 fL (ref 80.0–100.0)
Platelets: 237 10*3/uL (ref 150–400)
RBC: 3.45 MIL/uL — ABNORMAL LOW (ref 4.22–5.81)
RDW: 13.8 % (ref 11.5–15.5)
WBC: 7 10*3/uL (ref 4.0–10.5)
nRBC: 0 % (ref 0.0–0.2)

## 2020-11-03 LAB — SARS CORONAVIRUS 2 (TAT 6-24 HRS): SARS Coronavirus 2: NEGATIVE

## 2020-11-03 LAB — GLUCOSE, CAPILLARY
Glucose-Capillary: 158 mg/dL — ABNORMAL HIGH (ref 70–99)
Glucose-Capillary: 201 mg/dL — ABNORMAL HIGH (ref 70–99)

## 2020-11-03 LAB — TROPONIN I (HIGH SENSITIVITY): Troponin I (High Sensitivity): 23 ng/L — ABNORMAL HIGH (ref <18)

## 2020-11-03 LAB — MAGNESIUM: Magnesium: 1.5 mg/dL — ABNORMAL LOW (ref 1.7–2.4)

## 2020-11-03 LAB — PHOSPHORUS: Phosphorus: 6.2 mg/dL — ABNORMAL HIGH (ref 2.5–4.6)

## 2020-11-03 NOTE — Progress Notes (Signed)
Patient refused bloodwork 

## 2020-11-03 NOTE — Discharge Summary (Signed)
Physician Discharge Summary  Equan Eisenzimmer [id number] DOB: 06-15-1960 DOA: 11/02/2020  PCP: Mellissa Kohut, DO  Admit date: 11/02/2020 Discharge date: 11/03/2020  PATIENT DISCHARGED AGAINST MEDICAL ADVICE PRIOR TO BEING SEEN.    I CAME TO SEE PATIENT FOR MORNING ROUNDS AND FOUND ROOM EMPTY.  RN REPORTED THAT PATIENT LEFT THE HOSPITAL WITHOUT TELLING ANYONE AND HAD IV IN PLACE.  HE DID NOT RECEIVE ANY HEMODIALYSIS TREATMENT.  SECURITY AND POLICE OFFICER NOTIFIED OF WHAT HAPPENED.  UNFORTUNATELY PATIENT IS HIGH RISK FOR ADVERSE OUTCOME, READMISSION AND DEATH DUE TO MISSING DIALYSIS TREATMENTS.    Discharge Condition: GUARDED    Discharge Diagnoses:  Active Problems:   HLD (hyperlipidemia)   Systolic and diastolic CHF, chronic (HCC)   Encounter for dialysis [facility])  Procedures/Studies: CT ABDOMEN PELVIS WO CONTRAST  Result Date: 10/30/2020 CLINICAL DATA:  Upper abdominal pain x6 days, nausea/vomiting EXAM: CT ABDOMEN AND PELVIS WITHOUT CONTRAST TECHNIQUE: Multidetector CT imaging of the abdomen and pelvis was performed following the standard protocol without IV contrast. COMPARISON:  08/20/2020 FINDINGS: Lower chest: Ground-glass opacity/mosaic attenuation in the lung bases, chronic. Hepatobiliary: Unenhanced liver is grossly unremarkable. Status post cholecystectomy. No intrahepatic or extrahepatic ductal dilatation. Pancreas: Within normal limits. Spleen: Within normal limits. Adrenals/Urinary Tract: Adrenal glands are within normal limits. Mild left renal atrophy. Right kidney is within normal limits. No renal, ureteral, or bladder calculi. No hydronephrosis. Bladder is within normal limits. Stomach/Bowel: Stomach is within normal limits. No evidence of bowel obstruction (series 2/image 55). No colonic wall thickening or inflammatory changes. Vascular/Lymphatic: No evidence of abdominal aortic aneurysm. Atherosclerotic calcifications of the abdominal aorta and branch vessels. No suspicious  abdominopelvic lymphadenopathy. Reproductive: Prostate is grossly unremarkable. Other: No abdominopelvic ascites. Small fat/fluid containing right inguinal hernia, unchanged. Musculoskeletal: Mild degenerative changes at L4-5. IMPRESSION: Status post cholecystectomy. Otherwise unremarkable CT abdomen/pelvis. Electronically Signed   By: Julian Hy M.D.   On: 10/30/2020 20:22   DG Chest Port 1 View  Result Date: 11/02/2020 CLINICAL DATA:  Shortness of breath and chest pain EXAM: PORTABLE CHEST 1 VIEW COMPARISON:  10/30/2020, 10/26/2020, 08/10/2020, 04/14/2020 FINDINGS: Right-sided central venous catheter tip over the SVC. Cardiomegaly with vascular congestion and probable mild interstitial edema. No pleural effusion or pneumothorax. IMPRESSION: Mild cardiomegaly with central congestion and slight interstitial edema Electronically Signed   By: Donavan Foil M.D.   On: 11/02/2020 19:31   DG Chest Port 1 View  Result Date: 10/30/2020 CLINICAL DATA:  Shortness of breath, chest pain EXAM: PORTABLE CHEST 1 VIEW COMPARISON:  10/26/2020 FINDINGS: Right-sided hemodialysis catheter terminates at the level of the superior cavoatrial junction. Stable mild cardiomegaly. Mild diffuse interstitial prominence is similar to prior. No focal airspace consolidation. No pleural effusion or pneumothorax. IMPRESSION: Mild cardiomegaly with mild diffuse interstitial prominence which may reflect interstitial edema. Electronically Signed   By: Davina Poke D.O.   On: 10/30/2020 12:02   DG Chest Portable 1 View  Result Date: 10/26/2020 CLINICAL DATA:  Shortness of breath.  Missed dialysis EXAM: PORTABLE CHEST 1 VIEW COMPARISON:  09/26/2020 FINDINGS: Right-sided hemodialysis catheter terminates at the level of the cavoatrial junction. Stable mild cardiomegaly. Mild diffuse interstitial prominence likely reflecting a component of mild edema. No focal airspace consolidation. No pleural effusion or pneumothorax. IMPRESSION:  Mild diffuse interstitial prominence likely reflecting mild pulmonary edema. Electronically Signed   By: Davina Poke D.O.   On: 10/26/2020 16:03     Discharge Exam: Vitals:   11/02/20 2238 11/02/20 2327  BP: 129/80 136/75  Pulse: 75 85  Resp:  19  Temp:  98.4 F (36.9 C)  SpO2: 91% 100%   Vitals:   11/02/20 1900 11/02/20 2238 11/02/20 2327 11/03/20 0500  BP: (!) 157/89 129/80 136/75   Pulse: 95 75 85   Resp: (!) 24  19   Temp:   98.4 F (36.9 C)   TempSrc:      SpO2: 97% 91% 100%   Weight:   99.7 kg 99.7 kg  Height:   '6\' 3"'$  (1.905 m)     The results of significant diagnostics from this hospitalization (including imaging, microbiology, ancillary and laboratory) are listed below for reference.     Microbiology: Recent Results (from the past 240 hour(s))  Resp Panel by RT-PCR (Flu A&B, Covid) Nasopharyngeal Swab     Status: None   Collection Time: 10/26/20  6:00 PM   Specimen: Nasopharyngeal Swab; Nasopharyngeal(NP) swabs in vial transport medium  Result Value Ref Range Status   SARS Coronavirus 2 by RT PCR NEGATIVE NEGATIVE Final    Comment: (NOTE) SARS-CoV-2 target nucleic acids are NOT DETECTED.  The SARS-CoV-2 RNA is generally detectable in upper respiratory specimens during the acute phase of infection. The lowest concentration of SARS-CoV-2 viral copies this assay can detect is 138 copies/mL. A negative result does not preclude SARS-Cov-2 infection and should not be used as the sole basis for treatment or other patient management decisions. A negative result may occur with  improper specimen collection/handling, submission of specimen other than nasopharyngeal swab, presence of viral mutation(s) within the areas targeted by this assay, and inadequate number of viral copies(<138 copies/mL). A negative result must be combined with clinical observations, patient history, and epidemiological information. The expected result is Negative.  Fact Sheet for Patients:   EntrepreneurPulse.com.au  Fact Sheet for Healthcare Providers:  IncredibleEmployment.be  This test is no t yet approved or cleared by the [place] FDA and  has been authorized for detection and/or diagnosis of SARS-CoV-2 by FDA under an Emergency Use Authorization (EUA). This EUA will remain  in effect (meaning this test can be used) for the duration of the COVID-19 declaration under Section 564(b)(1) of the Act, 21 U.S.C.section 360bbb-3(b)(1), unless the authorization is terminated  or revoked sooner.       Influenza A by PCR NEGATIVE NEGATIVE Final   Influenza B by PCR NEGATIVE NEGATIVE Final    Comment: (NOTE) The Xpert Xpress SARS-CoV-2/FLU/RSV plus assay is intended as an aid in the diagnosis of influenza from Nasopharyngeal swab specimens and should not be used as a sole basis for treatment. Nasal washings and aspirates are unacceptable for Xpert Xpress SARS-CoV-2/FLU/RSV testing.  Fact Sheet for Patients: EntrepreneurPulse.com.au  Fact Sheet for Healthcare Providers: IncredibleEmployment.be  This test is not yet approved or cleared by the [place] FDA and has been authorized for detection and/or diagnosis of SARS-CoV-2 by FDA under an Emergency Use Authorization (EUA). This EUA will remain in effect (meaning this test can be used) for the duration of the COVID-19 declaration under Section 564(b)(1) of the Act, 21 U.S.C. section 360bbb-3(b)(1), unless the authorization is terminated or revoked.  Performed at [facility], [address], [place], [place] [address]   Resp Panel by RT-PCR (Flu A&B, Covid) Nasopharyngeal Swab     Status: None   Collection Time: 10/30/20 11:06 AM   Specimen: Nasopharyngeal Swab; Nasopharyngeal(NP) swabs in vial transport medium  Result Value Ref Range Status   SARS Coronavirus 2 by RT PCR NEGATIVE NEGATIVE Final  Comment: (NOTE) SARS-CoV-2 target  nucleic acids are NOT DETECTED.  The SARS-CoV-2 RNA is generally detectable in upper respiratory specimens during the acute phase of infection. The lowest concentration of SARS-CoV-2 viral copies this assay can detect is 138 copies/mL. A negative result does not preclude SARS-Cov-2 infection and should not be used as the sole basis for treatment or other patient management decisions. A negative result may occur with  improper specimen collection/handling, submission of specimen other than nasopharyngeal swab, presence of viral mutation(s) within the areas targeted by this assay, and inadequate number of viral copies(<138 copies/mL). A negative result must be combined with clinical observations, patient history, and epidemiological information. The expected result is Negative.  Fact Sheet for Patients:  EntrepreneurPulse.com.au  Fact Sheet for Healthcare Providers:  IncredibleEmployment.be  This test is no t yet approved or cleared by the [place] FDA and  has been authorized for detection and/or diagnosis of SARS-CoV-2 by FDA under an Emergency Use Authorization (EUA). This EUA will remain  in effect (meaning this test can be used) for the duration of the COVID-19 declaration under Section 564(b)(1) of the Act, 21 U.S.C.section 360bbb-3(b)(1), unless the authorization is terminated  or revoked sooner.       Influenza A by PCR NEGATIVE NEGATIVE Final   Influenza B by PCR NEGATIVE NEGATIVE Final    Comment: (NOTE) The Xpert Xpress SARS-CoV-2/FLU/RSV plus assay is intended as an aid in the diagnosis of influenza from Nasopharyngeal swab specimens and should not be used as a sole basis for treatment. Nasal washings and aspirates are unacceptable for Xpert Xpress SARS-CoV-2/FLU/RSV testing.  Fact Sheet for Patients: EntrepreneurPulse.com.au  Fact Sheet for Healthcare  Providers: IncredibleEmployment.be  This test is not yet approved or cleared by the [place] FDA and has been authorized for detection and/or diagnosis of SARS-CoV-2 by FDA under an Emergency Use Authorization (EUA). This EUA will remain in effect (meaning this test can be used) for the duration of the COVID-19 declaration under Section 564(b)(1) of the Act, 21 U.S.C. section 360bbb-3(b)(1), unless the authorization is terminated or revoked.  Performed at [facility], [address] [place], [place] [address]      Labs: BNP (last 3 results) Recent Labs    06/23/20 1248  BNP [id number]*   Basic Metabolic Panel: Recent Labs  Lab 10/30/20 1119 11/02/20 1912 11/03/20 0344  NA 133* 132* 134*  K 3.9 4.3 4.8  CL 103 101 102  CO2 21* 20* 21*  GLUCOSE 297* 244* 222*  BUN 71* 77* 81*  CREATININE 6.23* 6.65* 6.81*  CALCIUM 7.1* 7.0* 7.2*  MG  --   --  1.5*  PHOS  --   --  6.2*   Liver Function Tests: Recent Labs  Lab 10/30/20 1119 11/03/20 0344  AST 10* 11*  ALT 10 10  ALKPHOS 90 106  BILITOT 0.4 0.4  PROT 7.8 7.9  ALBUMIN 3.2* 3.2*   Recent Labs  Lab 10/30/20 1811  LIPASE 35   No results for input(s): AMMONIA in the last 168 hours. CBC: Recent Labs  Lab 10/30/20 1119 10/31/20 0447 11/02/20 1912 11/03/20 0344  WBC 7.2 6.5 7.8 7.0  NEUTROABS 5.2  --  5.6  --   HGB 10.5* 9.8* 10.6* 10.4*  HCT 32.1* 30.7* 31.4* 32.4*  MCV 94.4 93.6 92.6 93.9  PLT 193 201 243 237   Cardiac Enzymes: No results for input(s): CKTOTAL, CKMB, CKMBINDEX, TROPONINI in the last 168 hours. BNP: Invalid input(s): POCBNP CBG: Recent Labs  Lab 10/30/20 1800 10/30/20 2141 10/31/20 0743 11/03/20 0021 11/03/20 0735  GLUCAP 125* 107* 142* 201* 158*   D-Dimer No results for input(s): DDIMER in the last 72 hours. Hgb A1c No results for input(s): HGBA1C in the last 72 hours. Lipid Profile No results for input(s): CHOL, HDL, LDLCALC, TRIG, CHOLHDL, LDLDIRECT  in the last 72 hours. Thyroid function studies No results for input(s): TSH, T4TOTAL, T3FREE, THYROIDAB in the last 72 hours.  Invalid input(s): FREET3 Anemia work up No results for input(s): VITAMINB12, FOLATE, FERRITIN, TIBC, IRON, RETICCTPCT in the last 72 hours. Urinalysis    Component Value Date/Time   COLORURINE STRAW (A) 08/22/2020 0957   APPEARANCEUR CLEAR 08/22/2020 0957   LABSPEC 1.006 08/22/2020 0957   PHURINE 6.0 08/22/2020 0957   GLUCOSEU 50 (A) 08/22/2020 0957   HGBUR SMALL (A) 08/22/2020 0957   BILIRUBINUR NEGATIVE 08/22/2020 0957   KETONESUR NEGATIVE 08/22/2020 0957   PROTEINUR 100 (A) 08/22/2020 0957   NITRITE NEGATIVE 08/22/2020 0957   LEUKOCYTESUR NEGATIVE 08/22/2020 0957   Sepsis Labs Invalid input(s): PROCALCITONIN,  WBC,  LACTICIDVEN Microbiology Recent Results (from the past 240 hour(s))  Resp Panel by RT-PCR (Flu A&B, Covid) Nasopharyngeal Swab     Status: None   Collection Time: 10/26/20  6:00 PM   Specimen: Nasopharyngeal Swab; Nasopharyngeal(NP) swabs in vial transport medium  Result Value Ref Range Status   SARS Coronavirus 2 by RT PCR NEGATIVE NEGATIVE Final    Comment: (NOTE) SARS-CoV-2 target nucleic acids are NOT DETECTED.  The SARS-CoV-2 RNA is generally detectable in upper respiratory specimens during the acute phase of infection. The lowest concentration of SARS-CoV-2 viral copies this assay can detect is 138 copies/mL. A negative result does not preclude SARS-Cov-2 infection and should not be used as the sole basis for treatment or other patient management decisions. A negative result may occur with  improper specimen collection/handling, submission of specimen other than nasopharyngeal swab, presence of viral mutation(s) within the areas targeted by this assay, and inadequate number of viral copies(<138 copies/mL). A negative result must be combined with clinical observations, patient history, and epidemiological information. The  expected result is Negative.  Fact Sheet for Patients:  EntrepreneurPulse.com.au  Fact Sheet for Healthcare Providers:  IncredibleEmployment.be  This test is no t yet approved or cleared by the [place] FDA and  has been authorized for detection and/or diagnosis of SARS-CoV-2 by FDA under an Emergency Use Authorization (EUA). This EUA will remain  in effect (meaning this test can be used) for the duration of the COVID-19 declaration under Section 564(b)(1) of the Act, 21 U.S.C.section 360bbb-3(b)(1), unless the authorization is terminated  or revoked sooner.       Influenza A by PCR NEGATIVE NEGATIVE Final   Influenza B by PCR NEGATIVE NEGATIVE Final    Comment: (NOTE) The Xpert Xpress SARS-CoV-2/FLU/RSV plus assay is intended as an aid in the diagnosis of influenza from Nasopharyngeal swab specimens and should not be used as a sole basis for treatment. Nasal washings and aspirates are unacceptable for Xpert Xpress SARS-CoV-2/FLU/RSV testing.  Fact Sheet for Patients: EntrepreneurPulse.com.au  Fact Sheet for Healthcare Providers: IncredibleEmployment.be  This test is not yet approved or cleared by the [place] FDA and has been authorized for detection and/or diagnosis of SARS-CoV-2 by FDA under an Emergency Use Authorization (EUA). This EUA will remain in effect (meaning this test can be used) for the duration of the COVID-19 declaration under Section 564(b)(1) of the Act, 21 U.S.C. section 360bbb-3(b)(1), unless  the authorization is terminated or revoked.  Performed at [facility], [address], [place], [place] [address]   Resp Panel by RT-PCR (Flu A&B, Covid) Nasopharyngeal Swab     Status: None   Collection Time: 10/30/20 11:06 AM   Specimen: Nasopharyngeal Swab; Nasopharyngeal(NP) swabs in vial transport medium  Result Value Ref Range Status   SARS Coronavirus 2 by RT PCR NEGATIVE  NEGATIVE Final    Comment: (NOTE) SARS-CoV-2 target nucleic acids are NOT DETECTED.  The SARS-CoV-2 RNA is generally detectable in upper respiratory specimens during the acute phase of infection. The lowest concentration of SARS-CoV-2 viral copies this assay can detect is 138 copies/mL. A negative result does not preclude SARS-Cov-2 infection and should not be used as the sole basis for treatment or other patient management decisions. A negative result may occur with  improper specimen collection/handling, submission of specimen other than nasopharyngeal swab, presence of viral mutation(s) within the areas targeted by this assay, and inadequate number of viral copies(<138 copies/mL). A negative result must be combined with clinical observations, patient history, and epidemiological information. The expected result is Negative.  Fact Sheet for Patients:  EntrepreneurPulse.com.au  Fact Sheet for Healthcare Providers:  IncredibleEmployment.be  This test is no t yet approved or cleared by the [place] FDA and  has been authorized for detection and/or diagnosis of SARS-CoV-2 by FDA under an Emergency Use Authorization (EUA). This EUA will remain  in effect (meaning this test can be used) for the duration of the COVID-19 declaration under Section 564(b)(1) of the Act, 21 U.S.C.section 360bbb-3(b)(1), unless the authorization is terminated  or revoked sooner.       Influenza A by PCR NEGATIVE NEGATIVE Final   Influenza B by PCR NEGATIVE NEGATIVE Final    Comment: (NOTE) The Xpert Xpress SARS-CoV-2/FLU/RSV plus assay is intended as an aid in the diagnosis of influenza from Nasopharyngeal swab specimens and should not be used as a sole basis for treatment. Nasal washings and aspirates are unacceptable for Xpert Xpress SARS-CoV-2/FLU/RSV testing.  Fact Sheet for Patients: EntrepreneurPulse.com.au  Fact Sheet for Healthcare  Providers: IncredibleEmployment.be  This test is not yet approved or cleared by the [place] FDA and has been authorized for detection and/or diagnosis of SARS-CoV-2 by FDA under an Emergency Use Authorization (EUA). This EUA will remain in effect (meaning this test can be used) for the duration of the COVID-19 declaration under Section 564(b)(1) of the Act, 21 U.S.C. section 360bbb-3(b)(1), unless the authorization is terminated or revoked.  Performed at [facility], [address], [place], [place] [address]     Time coordinating discharge:   SIGNED:  Irwin Brakeman, MD  [facility] 11/03/2020, 8:48 AM How to contact the [facility] Attending or Consulting provider [place] or covering provider during after hours [place], for this patient?  Check the care team in [facility] and look for a) attending/consulting [facility] provider listed and b) the [facility] team listed Log into [email or web address] and use [place]'s universal password to access. If you do not have the password, please contact the hospital operator. Locate the [facility] provider you are looking for under [facility] and page to a number that you can be directly reached. If you still have difficulty reaching the provider, please page the [facility] (Director on Call) for the Hospitalists listed on amion for assistance.

## 2020-11-03 NOTE — Progress Notes (Signed)
Patient left floor.  Patient did not notify anyone of his leaving and volutarily left AMA with IV in.  Security notified, and [place] police notified that was in parking lot.  Patient not seen.  MD to be notified,.

## 2020-11-03 NOTE — H&P (Signed)
TRH H&P    Patient Demographics:    Zachary Young, is a 59 y.o. male  MRN: [id number]  DOB - [birth year]  Admit Date - 11/02/2020  Referring MD/NP/PA: Threasa Alpha  Outpatient Primary MD for the patient is Mellissa Kohut, DO  Patient coming from: Home  Chief complaint- Need dialysis   HPI:    Zachary Young  is a 60 y.o. male, with history of CHF, diabetes mellitus type 2, ESRD, peripheral vascular disease, hypertension, hyperlipidemia, coronary artery disease, osteomyelitis status post right BKA tobacco and cocaine use presents to the ED for chief complaint of several missed his dialysis.  Patient now having chest pain, shortness of breath, feeling fluid overloaded.  He reports that chest pain, shortness of breath started 3 days ago.  It started with the dyspnea, secondary to fluid buildup.  He then provides a story and symptoms started almost a week ago.  Patient is supposed to get hemodialysis Monday Wednesday Friday, but has been involuntarily discharged from his dialysis center secondary to missed treatment of staff.  Patient is not sure of his dry weight.  He reports that he has been vomiting 3-4 times a day.  He reports that he does not weekend and so he cannot tell if minutes hematemesis or not.  Patient reports he is also having diarrhea.  He has had 1 episode of bright red blood on the toilet paper after he wipes.  Patient denies having hemorrhoids.  Patient's last normal meal was 3 days ago.  He reports a decreased appetite.  When he does eat he does not try to follow diet or use fluid restriction.  Patient reports that he has chest tightness.  He reports that he has had a heart attack before and this does not feel like that.  Its not exertional, worse with positional changes, only a pain in his chest, only lasts seconds.  Patient reports he does not smoke, does not drink, does not use illicit drugs.  His UDS does  show cocaine.  In the ED Patient is afebrile, heart rate 93-95, respiratory rate 18-26, blood pressure 157/89, satting 97% Hematology shows no leukocytosis, hemoglobin 9.8 Chemistry shows a slight hyponatremia 132, glucose 244 Elevated BUN and creatinine at 77 and 6.65 Chest x-ray shows mild cardiomegaly with central congestion and slight interstitial edema Admission requested to try to get this guy dialyzed ED provider spoke with nephrology who agrees to see patient in the a.m.    Review of systems:    In addition to the HPI above,  No Fever-chills, No Headache, No changes with Vision or hearing, No problems swallowing food or Liquids, Admits to chest pain and shortness of breath No Abdominal pain, No Nausea or Vomiting, bowel movements are regular, No Blood in stool or Urine, No dysuria, No new skin rashes or bruises, No new joints pains-aches,  No new weakness, tingling, numbness in any extremity, No recent weight gain or loss, No polyuria, polydypsia or polyphagia, No significant Mental Stressors.  All other systems reviewed and are negative.    Past History  of the following :    Past Medical History:  Diagnosis Date   CHF (congestive heart failure) ([employer])    Diabetes mellitus without complication ([place])    Dialysis patient [facility])    Peripheral edema    Renal disorder    kidney disease      Past Surgical History:  Procedure Laterality Date   AMPUTATION Right 10/21/2019   Procedure: RIGHT BELOW KNEE AMPUTATION;  Surgeon: Newt Minion, MD;  Location: [place];  Service: Orthopedics;  Laterality: Right;   AV FISTULA PLACEMENT Left 11/13/2019   Procedure: LEFT ARTERIOVENOUS (AV) FISTULA VERSUS ARTERIOVENOUS GRAFT;  Surgeon: Rosetta Posner, MD;  Location[facility];  Service: Vascular;  Laterality: Left;   AV FISTULA PLACEMENT Left 04/05/2020   Procedure: INSERTION OF LEFT ARM ARTERIOVENOUS (AV) GORE-TEX GRAFT;  Surgeon: Rosetta Posner, MD;  Location[facility];  Service: Vascular;   Laterality: Left;   AV FISTULA PLACEMENT Right 07/14/2020   Procedure: RIGHT ARM ARTERIOVENOUS (AV) GRAFT CREATION;  Surgeon: Rosetta Posner, MD;  Location: [facility];  Service: Vascular;  Laterality: Right;   [place] Left 01/27/2020   Procedure: LEFT ARM 2ND STAGE [place];  Surgeon: Rosetta Posner, MD;  Location: [place];  Service: Vascular;  Laterality: Left;   DIALYSIS/PERMA CATHETER INSERTION N/A 03/03/2020   Procedure: DIALYSIS/PERMA CATHETER INSERTION;  Surgeon: Algernon Huxley, MD;  Location: [place] [facility];  Service: Cardiovascular;  Laterality: N/A;   ESOPHAGOGASTRODUODENOSCOPY (EGD) WITH PROPOFOL N/A 06/17/2020   gastritis, normal duodenum.    INSERTION OF DIALYSIS CATHETER N/A 04/05/2020   Procedure: INSERTION OF TUNNEL  DIALYSIS CATHETER LEFT INTERNAL JUGULAR;  Surgeon: Rosetta Posner, MD;  [facility];  Service: Vascular;  Laterality: N/A;   IR FLUORO GUIDE CV LINE RIGHT  11/05/2019   IR FLUORO GUIDE CV LINE RIGHT  06/07/2020   IR REMOVAL TUN CV CATH W/O FL  06/01/2020   IR THROMBECTOMY AV FISTULA W/THROMBOLYSIS/PTA INC/SHUNT/IMG LEFT Left 06/06/2020   IR US GUIDE VASC ACCESS LEFT  06/06/2020   IR US GUIDE VASC ACCESS RIGHT  11/05/2019   IR US GUIDE VASC ACCESS RIGHT  06/07/2020   REMOVAL OF A DIALYSIS CATHETER Right 04/05/2020   Procedure: REMOVAL OF RIGHT CHEST DIALYSIS CATHETER;  Surgeon: Rosetta Posner, MD;  [facility];  Service: Vascular;  Laterality: Right;   TOE AMPUTATION Bilateral    due to osteomyelitis, all 5 each foot      Social History:      Social History   Tobacco Use   Smoking status: Every Day    Packs/day: 0.50    Types: Cigarettes   Smokeless tobacco: Never  Substance Use Topics   Alcohol use: Not Currently       Family History :     Family History  Problem Relation Age of Onset   Cancer Mother        uknown type of cancer   Colon cancer Neg Hx    Colon polyps Neg Hx       Home Medications:    Prior to Admission medications   Medication Sig Start Date End Date Taking? Authorizing Provider  amLODipine (NORVASC) 5 MG tablet Take 1 tablet (5 mg total) by mouth daily. 06/28/20  Yes Tat, Shanon Brow, MD  atorvastatin (LIPITOR) 20 MG tablet Take 1 tablet (20 mg total) by mouth daily with supper. 06/28/20  Yes Tat, Shanon Brow, MD  dicyclomine (BENTYL) 20 MG tablet Take 1  tablet (20 mg total) by mouth 2 (two) times daily. 07/22/20  Yes Margarita Mail, PA-C  ergocalciferol (VITAMIN D2) 1.25 MG (50000 UT) capsule Take 1 capsule (50,000 Units total) by mouth every Friday. 12/11/19  Yes Samella Parr, NP  insulin glargine (LANTUS) 100 UNIT/ML injection Inject 0.35 mLs (35 Units total) into the skin at bedtime. Patient taking differently: Inject 25 Units into the skin at bedtime. 12/11/19  Yes Samella Parr, NP  NOVOLOG FLEXPEN 100 UNIT/ML FlexPen Inject 4 Units into the skin 3 (three) times daily with meals. 05/24/20  Yes [provider]  oxyCODONE-acetaminophen (PERCOCET) 5-325 MG tablet Take 1 tablet by mouth every 6 (six) hours as needed. 08/16/20  Yes Milton Ferguson, MD  pantoprazole (PROTONIX) 40 MG tablet Take 1 tablet (40 mg total) by mouth 2 (two) times daily before a meal. 08/02/20  Yes Annitta Needs, NP  phenytoin (DILANTIN) 100 MG ER capsule Take 3 capsules (300 mg total) by mouth at bedtime. 06/15/20  Yes Garvin Fila, MD  sucralfate (CARAFATE) 1 g tablet Take 1 tablet (1 g total) by mouth 4 (four) times daily -  with meals and at bedtime. 07/22/20  Yes Harris, Abigail, PA-C  torsemide (DEMADEX) 100 MG tablet Take 1 tablet (100 mg total) by mouth daily. On Monday-Wednesday-Friday-Sunday (non-dialysis days) Patient taking differently: Take 100 mg by mouth 4 (four) times a week. On Monday-Wednesday-Friday-Sunday (non-dialysis days) 06/28/20  Yes Tat, Shanon Brow, MD  lidocaine-prilocaine (EMLA) cream Apply 1 application topically as needed (port site). Patient not taking: Reported on 11/02/2020     [provider]     Allergies:     Allergies  Allergen Reactions   Bee Venom Anaphylaxis    Per pt, nearly died from bee sting as a child   Propofol Other (See Comments)     Hallucinations   Eggs Or Egg-Derived Products Nausea And Vomiting and Other (See Comments)    Stomach cramps in large amounts   Morphine Nausea And Vomiting   Oxycodone Nausea And Vomiting     Physical Exam:   Vitals  Blood pressure 136/75, pulse 85, temperature 98.4 F (36.9 C), resp. rate 19, height '6\' 3"'$  (1.905 m), weight 99.7 kg, SpO2 100 %.  1.  General: Patient lying supine in bed,  no acute distress   2. Psychiatric: Alert and oriented x 3, mood and behavior normal for situation, pleasant and cooperative with exam   3. Neurologic: Speech and language are normal, face is symmetric, moves all 4 extremities voluntarily, at baseline without acute deficits on limited exam   4. HEENMT:  Head is atraumatic, normocephalic, pupils reactive to light, neck is supple, trachea is midline, mucous membranes are moist   5. Respiratory : Lungs are clear to auscultation bilaterally without wheezing, rhonchi, rales, no cyanosis, no increase in work of breathing or accessory muscle use   6. Cardiovascular : Heart rate normal, rhythm is regular, no murmurs, rubs or gallops, no peripheral edema, peripheral pulses palpated   7. Gastrointestinal:  Abdomen is soft, nondistended, nontender to palpation bowel sounds active, no masses or organomegaly palpated   8. Skin:  Skin is warm, dry and intact without rashes, acute lesions, or ulcers on limited exam   9.Musculoskeletal:  Right BKA, left midfoot amputation, no asymmetry in tone, no peripheral edema, peripheral pulses palpated, no tenderness to palpation in the extremities     Data Review:    CBC Recent Labs  Lab 10/30/20 1119 10/31/20 0447 11/02/20 1912  11/03/20 0344  WBC 7.2 6.5 7.8 7.0  HGB 10.5* 9.8* 10.6* 10.4*  HCT 32.1* 30.7* 31.4*  32.4*  PLT 193 201 243 237  MCV 94.4 93.6 92.6 93.9  MCH 30.9 29.9 31.3 30.1  MCHC 32.7 31.9 33.8 32.1  RDW 13.8 13.8 13.8 13.8  LYMPHSABS 1.3  --  1.4  --   MONOABS 0.6  --  0.6  --   EOSABS 0.1  --  0.1  --   BASOSABS 0.0  --  0.0  --    ------------------------------------------------------------------------------------------------------------------  Results for orders placed or performed during the hospital encounter of 11/02/20 (from the past 48 hour(s))  Basic metabolic panel     Status: Abnormal   Collection Time: 11/02/20  7:12 PM  Result Value Ref Range   Sodium 132 (L) 135 - 145 mmol/L   Potassium 4.3 3.5 - 5.1 mmol/L   Chloride 101 98 - 111 mmol/L   CO2 20 (L) 22 - 32 mmol/L   Glucose, Bld 244 (H) 70 - 99 mg/dL    Comment: Glucose reference range applies only to samples taken after fasting for at least 8 hours.   BUN 77 (H) 6 - 20 mg/dL   Creatinine, Ser 6.65 (H) 0.61 - 1.24 mg/dL   Calcium 7.0 (L) 8.9 - 10.3 mg/dL   GFR, Estimated 9 (L) >60 mL/min    Comment: (NOTE) Calculated using the CKD-EPI Creatinine Equation (2021)    Anion gap 11 5 - 15    Comment: Performed at [facility], [address] [place], [place] [address]  CBC with Differential/Platelet     Status: Abnormal   Collection Time: 11/02/20  7:12 PM  Result Value Ref Range   WBC 7.8 4.0 - 10.5 K/uL   RBC 3.39 (L) 4.22 - 5.81 MIL/uL   Hemoglobin 10.6 (L) 13.0 - 17.0 g/dL   HCT 31.4 (L) 39.0 - 52.0 %   MCV 92.6 80.0 - 100.0 fL   MCH 31.3 26.0 - 34.0 pg   MCHC 33.8 30.0 - 36.0 g/dL   RDW 13.8 11.5 - 15.5 %   Platelets 243 150 - 400 K/uL   nRBC 0.0 0.0 - 0.2 %   Neutrophils Relative % 73 %   Neutro Abs 5.6 1.7 - 7.7 K/uL   Lymphocytes Relative 18 %   Lymphs Abs 1.4 0.7 - 4.0 K/uL   Monocytes Relative 8 %   Monocytes Absolute 0.6 0.1 - 1.0 K/uL   Eosinophils Relative 1 %   Eosinophils Absolute 0.1 0.0 - 0.5 K/uL   Basophils Relative 0 %   Basophils Absolute 0.0 0.0 - 0.1 K/uL   Immature  Granulocytes 0 %   Abs Immature Granulocytes 0.03 0.00 - 0.07 K/uL    Comment: Performed at [facility], [address] [place], [place] [address]  Troponin I (High Sensitivity)     Status: Abnormal   Collection Time: 11/02/20  7:12 PM  Result Value Ref Range   Troponin I (High Sensitivity) 26 (H) <18 ng/L    Comment: (NOTE) Elevated high sensitivity troponin I (hsTnI) values and significant  changes across serial measurements may suggest ACS but many other  chronic and acute conditions are known to elevate hsTnI results.  Refer to the "Links" section for chest pain algorithms and additional  guidance. Performed at [facility], [address], [place], [place] [address]   Glucose, capillary     Status: Abnormal   Collection Time: 11/03/20 12:21 AM  Result Value Ref  Range   Glucose-Capillary 201 (H) 70 - 99 mg/dL    Comment: Glucose reference range applies only to samples taken after fasting for at least 8 hours.  Comprehensive metabolic panel     Status: Abnormal   Collection Time: 11/03/20  3:44 AM  Result Value Ref Range   Sodium 134 (L) 135 - 145 mmol/L   Potassium 4.8 3.5 - 5.1 mmol/L   Chloride 102 98 - 111 mmol/L   CO2 21 (L) 22 - 32 mmol/L   Glucose, Bld 222 (H) 70 - 99 mg/dL    Comment: Glucose reference range applies only to samples taken after fasting for at least 8 hours.   BUN 81 (H) 6 - 20 mg/dL   Creatinine, Ser 6.81 (H) 0.61 - 1.24 mg/dL   Calcium 7.2 (L) 8.9 - 10.3 mg/dL   Total Protein 7.9 6.5 - 8.1 g/dL   Albumin 3.2 (L) 3.5 - 5.0 g/dL   AST 11 (L) 15 - 41 U/L   ALT 10 0 - 44 U/L   Alkaline Phosphatase 106 38 - 126 U/L   Total Bilirubin 0.4 0.3 - 1.2 mg/dL   GFR, Estimated 9 (L) >60 mL/min    Comment: (NOTE) Calculated using the CKD-EPI Creatinine Equation (2021)    Anion gap 11 5 - 15    Comment: Performed at [facility], [address], [place], [place] [address]  Magnesium     Status: Abnormal   Collection Time: 11/03/20  3:44 AM  Result Value Ref  Range   Magnesium 1.5 (L) 1.7 - 2.4 mg/dL    Comment: Performed at [facility], [address] [place], [place] [address]  Phosphorus     Status: Abnormal   Collection Time: 11/03/20  3:44 AM  Result Value Ref Range   Phosphorus 6.2 (H) 2.5 - 4.6 mg/dL    Comment: Performed at [facility], [address], [place], [place] [address]  CBC     Status: Abnormal   Collection Time: 11/03/20  3:44 AM  Result Value Ref Range   WBC 7.0 4.0 - 10.5 K/uL   RBC 3.45 (L) 4.22 - 5.81 MIL/uL   Hemoglobin 10.4 (L) 13.0 - 17.0 g/dL   HCT 32.4 (L) 39.0 - 52.0 %   MCV 93.9 80.0 - 100.0 fL   MCH 30.1 26.0 - 34.0 pg   MCHC 32.1 30.0 - 36.0 g/dL   RDW 13.8 11.5 - 15.5 %   Platelets 237 150 - 400 K/uL   nRBC 0.0 0.0 - 0.2 %    Comment: Performed at [facility], [address], [place], [place] [address]    Chemistries  Recent Labs  Lab 10/30/20 1119 11/02/20 1912 11/03/20 0344  NA 133* 132* 134*  K 3.9 4.3 4.8  CL 103 101 102  CO2 21* 20* 21*  GLUCOSE 297* 244* 222*  BUN 71* 77* 81*  CREATININE 6.23* 6.65* 6.81*  CALCIUM 7.1* 7.0* 7.2*  MG  --   --  1.5*  AST 10*  --  11*  ALT 10  --  10  ALKPHOS 90  --  106  BILITOT 0.4  --  0.4   ------------------------------------------------------------------------------------------------------------------  ------------------------------------------------------------------------------------------------------------------ GFR: Estimated Creatinine Clearance: 14.1 mL/min (A) (by C-G formula based on SCr of 6.81 mg/dL (H)). Liver Function Tests: Recent Labs  Lab 10/30/20 1119 11/03/20 0344  AST 10* 11*  ALT 10 10  ALKPHOS 90 106  BILITOT 0.4 0.4  PROT 7.8 7.9  ALBUMIN 3.2* 3.2*  Recent Labs  Lab 10/30/20 1811  LIPASE 35   No results for input(s): AMMONIA in the last 168 hours. Coagulation Profile: No results for input(s): INR, PROTIME in the last 168 hours. Cardiac Enzymes: No results for input(s): CKTOTAL, CKMB, CKMBINDEX, TROPONINI  in the last 168 hours. BNP (last 3 results) No results for input(s): PROBNP in the last 8760 hours. HbA1C: No results for input(s): HGBA1C in the last 72 hours. CBG: Recent Labs  Lab 10/30/20 1800 10/30/20 2141 10/31/20 0743 11/03/20 0021  GLUCAP 125* 107* 142* 201*   Lipid Profile: No results for input(s): CHOL, HDL, LDLCALC, TRIG, CHOLHDL, LDLDIRECT in the last 72 hours. Thyroid Function Tests: No results for input(s): TSH, T4TOTAL, FREET4, T3FREE, THYROIDAB in the last 72 hours. Anemia Panel: No results for input(s): VITAMINB12, FOLATE, FERRITIN, TIBC, IRON, RETICCTPCT in the last 72 hours.  --------------------------------------------------------------------------------------------------------------- Urine analysis:    Component Value Date/Time   COLORURINE STRAW (A) 08/22/2020 0957   APPEARANCEUR CLEAR 08/22/2020 0957   LABSPEC 1.006 08/22/2020 0957   PHURINE 6.0 08/22/2020 0957   GLUCOSEU 50 (A) 08/22/2020 0957   HGBUR SMALL (A) 08/22/2020 0957   BILIRUBINUR NEGATIVE 08/22/2020 0957   KETONESUR NEGATIVE 08/22/2020 0957   PROTEINUR 100 (A) 08/22/2020 0957   NITRITE NEGATIVE 08/22/2020 0957   LEUKOCYTESUR NEGATIVE 08/22/2020 0957      Imaging Results:    DG Chest Port 1 View  Result Date: 11/02/2020 CLINICAL DATA:  Shortness of breath and chest pain EXAM: PORTABLE CHEST 1 VIEW COMPARISON:  10/30/2020, 10/26/2020, 08/10/2020, 04/14/2020 FINDINGS: Right-sided central venous catheter tip over the SVC. Cardiomegaly with vascular congestion and probable mild interstitial edema. No pleural effusion or pneumothorax. IMPRESSION: Mild cardiomegaly with central congestion and slight interstitial edema Electronically Signed   By: Donavan Foil M.D.   On: 11/02/2020 19:31    My personal review of EKG: Rhythm NSR, Rate 93/min, QTc 498 ,no Acute ST changes   Assessment & Plan:    Active Problems:   HLD (hyperlipidemia)   Systolic and diastolic CHF, chronic (HCC)   Encounter  for dialysis [facility])   Encounter for dialysis Consult nephrology, appreciate input Potassium 4.3, creatinine now 6.81, BUN 81 [place] Monday Wednesday Friday schedule but no place to get dialysis Consult transition of care team Continue to monitor CHF/CAD Fluid overload and chest pain update echo Last echo May 2021 shows an ejection fraction of 50 to 55% Troponins: initial 26, repeat pending Continue home medications Diabetes mellitus type 2 Continue home insulin and sliding scale Last A1c 7.8 Renal diet Substance abuse Last UDS showed cocaine, repeat UDS Hypertension Continue Norvasc Continue lisinopril Hyperlipidemia Continue statin GERD Continue Protonix    DVT Prophylaxis-Heparin- SCDs   AM Labs Ordered, also please review Full Orders  Family Communication: No family at bedside Code Status: Full  Admission status: Observation Time spent in minutes : [place] DO

## 2020-11-10 NOTE — Discharge Summary (Signed)
Physician Discharge Summary  Deavin Posen [id number] DOB: 04-Aug-1960 DOA: 10/30/2020  PCP: Mellissa Kohut, DO  Admit date: 10/30/2020 Discharge date: 10/31/20  Admitted From: Home Disposition:  AGAINST MEDICAL ADVICE   Discharge Condition: AGAINST MEDICAL ADVICE     Brief/Interim Summary:  60 y.o. male with medical history significant for pancreatitis, ESRD on HD MWF [facility], [place], s/p right BKA, poorly controlled diabetes mellitus type 2, hypertension, hyperlipidemia, biventricular heart failure, CAD, osteomyelitis s/p R-BKA, CAD, tobacco and cocaine use, s/p right arm AV fistula with chronic arm pain afterwards, to ED complaining of fistula pain and missed several days of HD.  He was dismissed from [facility] due to lashing out at a nurse and swatting her hand away when she was trying to draw blood from his arm.  Social work has confirmed that he was involuntarily discharged from their facility and now he has no place to receive hemodialysis except for a hospital setting.   The patient presents with chest pain, shortness of breath, nausea, vomiting the last 4 to 5 days.  He states that he has not had dialysis since 10/24/2020.  However review of the medical record shows that the patient has been to the emergency department on many occasions and offered dialysis many times of which he refuses and leaves [place].  Most recently, the patient was in the emergency department on 10/26/2020.  He was offered dialysis, but refused for his fistula to be accessed.  Ultrafiltration was done on that day removing 789 cc of fluid.   He denies any fevers, chills, headache, neck pain, cough, hemoptysis.  He complains of chest pain lasting about 20 to 30 minutes intermittently for the past 4 days.  He has some associated shortness of breath with nausea and vomiting.  There is no hematemesis, hematochezia, melena.  He has chronic abdominal pain, he states that this has not  changed. The patient was recently hospitalized from 09/26/2020 to 09/28/2020 where he was noted to have positive cocaine urine drug test.  During that admission, he was noted to be incidentally COVID positive.  He was treated with monoclonal antibody.   Upon asking the patient if he drinks any alcohol or uses any cocaine he became irate and began to yell ambulate me. Subsequently, he asked me if he could have additional Percocet. In the emergency department, the patient was afebrile hemodynamically stable with oxygen saturation 96% room air.  BMP showed potassium 3.9, bicarbonate 21, and serum creatinine 6.23.  LFTs were unremarkable.  WBC 7.2, hemoglobin 7.5, platelets 193,000.  Chest x-ray showed interstitial edema.  Nephrology was contacted and stated that the patient will be offered dialysis on 10/31/2020.   In the am 10/31/20, patient was offered dialysis; however, he insisted upon leaving AMA before dialysis could be set up  In speaking with Suzi Roots, Suzi Roots has demonstrated the ability to understand his medical condition(s) which include death, uremia.  Audi Harlin has demonstrated the ability to appreciate how treatment for ESRD will be beneficial.   Mattew Mcculla has also demonstrated the ability to understand and appreciate how refusal of treatement for ESRD could result in harm, repeat hospitalization, and possibly death.  Boniface Rotunno demonstrates the ability to reason through the risks and benefits of the proposed treatment.  Finally, Aza Laue is able to clearly communicate his/her choice.     Discharge Diagnoses:   Fluid overload/ESRD -normally dialyzed MWF -Nephrology consulted--> plan for dialysis 10/31/2020 -Stable on room air -Last had ultrafiltration  on 10/26/2020 removing 789 cc -Patient has been dismissed by his dialysis center and has to come to the ED intermittently to receive dialysis -PATIENT LEFT AMA ON 10/31/20 BEFORE DIALYSIS COULD BE SETUP    Nausea/vomiting/abdominal pain -Check lipase 35 -PRN antiemetic -10/30/20 CT abd/pelvis--Status post cholecystectomy.  Otherwise unremarkable CT abdomen/pelvis. -start PPI   Chest pain -cycle troponins -Echo   Uncontrolled diabetes mellitus type 2 with hyperglycemia -08/21/20 A1C-7.8 -novolog sliding scale -started reduced dose lantus   Mixed hyperlipidemia -LDL 148 -Triglycerides 179 -Total cholesterol 223 -continue statin -above was checked on 0000000   Chronic systolic and diastolic CHF -The patient does appear hypervolemic, but suspect this is related to his ESRD and missing dialysis -08/16/19 echo EF 50-55%, no WMA, trivial TR   Bronchiectasis/tobacco abuse -No wheezing on exam -Tobacco cessation discussed -Stable on room air -Personally reviewed chest x-ray--increased interstitial markings -repeat UDS   Hypertension -SBP consistently in 160s to 180s -continue amlodipine   Right BKA status -has chronic phantom limb pain and restarted on home medications.       Discharge Instructions   Allergies as of 10/31/2020       Reactions   Bee Venom Anaphylaxis   Per pt, nearly died from bee sting as a child   Propofol Other (See Comments)    Hallucinations   Eggs Or Egg-derived Products Nausea And Vomiting, Other (See Comments)   Stomach cramps in large amounts   Morphine Nausea And Vomiting   Oxycodone Nausea And Vomiting        Medication List     ASK your doctor about these medications    amLODipine 5 MG tablet Commonly known as: NORVASC Take 1 tablet (5 mg total) by mouth daily.   atorvastatin 20 MG tablet Commonly known as: LIPITOR Take 1 tablet (20 mg total) by mouth daily with supper.   dicyclomine 20 MG tablet Commonly known as: BENTYL Take 1 tablet (20 mg total) by mouth 2 (two) times daily.   ergocalciferol 1.25 MG (50000 UT) capsule Commonly known as: VITAMIN D2 Take 1 capsule (50,000 Units total) by mouth every Friday.   insulin  glargine 100 UNIT/ML injection Commonly known as: LANTUS Inject 0.35 mLs (35 Units total) into the skin at bedtime.   lidocaine-prilocaine cream Commonly known as: EMLA Apply 1 application topically as needed (port site).   NovoLOG FlexPen 100 UNIT/ML FlexPen Generic drug: insulin aspart Inject 4 Units into the skin 3 (three) times daily with meals.   oxyCODONE-acetaminophen 5-325 MG tablet Commonly known as: Percocet Take 1 tablet by mouth every 6 (six) hours as needed.   pantoprazole 40 MG tablet Commonly known as: PROTONIX Take 1 tablet (40 mg total) by mouth 2 (two) times daily before a meal.   phenytoin 100 MG ER capsule Commonly known as: DILANTIN Take 3 capsules (300 mg total) by mouth at bedtime.   sucralfate 1 g tablet Commonly known as: Carafate Take 1 tablet (1 g total) by mouth 4 (four) times daily -  with meals and at bedtime.   torsemide 100 MG tablet Commonly known as: DEMADEX Take 1 tablet (100 mg total) by mouth daily. On Monday-Wednesday-Friday-Sunday (non-dialysis days)        Allergies  Allergen Reactions   Bee Venom Anaphylaxis    Per pt, nearly died from bee sting as a child   Propofol Other (See Comments)     Hallucinations   Eggs Or Egg-Derived Products Nausea And Vomiting and Other (See Comments)  Stomach cramps in large amounts   Morphine Nausea And Vomiting   Oxycodone Nausea And Vomiting    Consultations: renal   Procedures/Studies: CT ABDOMEN PELVIS WO CONTRAST  Result Date: 10/30/2020 CLINICAL DATA:  Upper abdominal pain x6 days, nausea/vomiting EXAM: CT ABDOMEN AND PELVIS WITHOUT CONTRAST TECHNIQUE: Multidetector CT imaging of the abdomen and pelvis was performed following the standard protocol without IV contrast. COMPARISON:  08/20/2020 FINDINGS: Lower chest: Ground-glass opacity/mosaic attenuation in the lung bases, chronic. Hepatobiliary: Unenhanced liver is grossly unremarkable. Status post cholecystectomy. No intrahepatic  or extrahepatic ductal dilatation. Pancreas: Within normal limits. Spleen: Within normal limits. Adrenals/Urinary Tract: Adrenal glands are within normal limits. Mild left renal atrophy. Right kidney is within normal limits. No renal, ureteral, or bladder calculi. No hydronephrosis. Bladder is within normal limits. Stomach/Bowel: Stomach is within normal limits. No evidence of bowel obstruction (series 2/image 55). No colonic wall thickening or inflammatory changes. Vascular/Lymphatic: No evidence of abdominal aortic aneurysm. Atherosclerotic calcifications of the abdominal aorta and branch vessels. No suspicious abdominopelvic lymphadenopathy. Reproductive: Prostate is grossly unremarkable. Other: No abdominopelvic ascites. Small fat/fluid containing right inguinal hernia, unchanged. Musculoskeletal: Mild degenerative changes at L4-5. IMPRESSION: Status post cholecystectomy. Otherwise unremarkable CT abdomen/pelvis. Electronically Signed   By: Julian Hy M.D.   On: 10/30/2020 20:22   DG Chest Port 1 View  Result Date: 11/02/2020 CLINICAL DATA:  Shortness of breath and chest pain EXAM: PORTABLE CHEST 1 VIEW COMPARISON:  10/30/2020, 10/26/2020, 08/10/2020, 04/14/2020 FINDINGS: Right-sided central venous catheter tip over the SVC. Cardiomegaly with vascular congestion and probable mild interstitial edema. No pleural effusion or pneumothorax. IMPRESSION: Mild cardiomegaly with central congestion and slight interstitial edema Electronically Signed   By: Donavan Foil M.D.   On: 11/02/2020 19:31   DG Chest Port 1 View  Result Date: 10/30/2020 CLINICAL DATA:  Shortness of breath, chest pain EXAM: PORTABLE CHEST 1 VIEW COMPARISON:  10/26/2020 FINDINGS: Right-sided hemodialysis catheter terminates at the level of the superior cavoatrial junction. Stable mild cardiomegaly. Mild diffuse interstitial prominence is similar to prior. No focal airspace consolidation. No pleural effusion or pneumothorax. IMPRESSION:  Mild cardiomegaly with mild diffuse interstitial prominence which may reflect interstitial edema. Electronically Signed   By: Davina Poke D.O.   On: 10/30/2020 12:02   DG Chest Portable 1 View  Result Date: 10/26/2020 CLINICAL DATA:  Shortness of breath.  Missed dialysis EXAM: PORTABLE CHEST 1 VIEW COMPARISON:  09/26/2020 FINDINGS: Right-sided hemodialysis catheter terminates at the level of the cavoatrial junction. Stable mild cardiomegaly. Mild diffuse interstitial prominence likely reflecting a component of mild edema. No focal airspace consolidation. No pleural effusion or pneumothorax. IMPRESSION: Mild diffuse interstitial prominence likely reflecting mild pulmonary edema. Electronically Signed   By: Davina Poke D.O.   On: 10/26/2020 16:03        Discharge Exam: Vitals:   10/31/20 0211 10/31/20 0637  BP: 127/69 (!) 145/80  Pulse: 74 77  Resp: 18 18  Temp: 98 F (36.7 C) 98 F (36.7 C)  SpO2: 100% 98%   Vitals:   10/30/20 1728 10/30/20 2145 10/31/20 0211 10/31/20 0637  BP: (!) 117/57 135/83 127/69 (!) 145/80  Pulse: 92 77 74 77  Resp: '20 17 18 18  '$ Temp: 97.7 F (36.5 C) 98.1 F (36.7 C) 98 F (36.7 C) 98 F (36.7 C)  TempSrc: Oral     SpO2: 100% 99% 100% 98%  Weight:      Height:        General: Pt is alert,  awake, not in acute distress Cardiovascular: RRR, S1/S2 +, no rubs, no gallops Respiratory: bibasilar rales. No wheeze Abdominal: Soft, NT, ND, bowel sounds +    The results of significant diagnostics from this hospitalization (including imaging, microbiology, ancillary and laboratory) are listed below for reference.    Significant Diagnostic Studies: CT ABDOMEN PELVIS WO CONTRAST  Result Date: 10/30/2020 CLINICAL DATA:  Upper abdominal pain x6 days, nausea/vomiting EXAM: CT ABDOMEN AND PELVIS WITHOUT CONTRAST TECHNIQUE: Multidetector CT imaging of the abdomen and pelvis was performed following the standard protocol without IV contrast. COMPARISON:   08/20/2020 FINDINGS: Lower chest: Ground-glass opacity/mosaic attenuation in the lung bases, chronic. Hepatobiliary: Unenhanced liver is grossly unremarkable. Status post cholecystectomy. No intrahepatic or extrahepatic ductal dilatation. Pancreas: Within normal limits. Spleen: Within normal limits. Adrenals/Urinary Tract: Adrenal glands are within normal limits. Mild left renal atrophy. Right kidney is within normal limits. No renal, ureteral, or bladder calculi. No hydronephrosis. Bladder is within normal limits. Stomach/Bowel: Stomach is within normal limits. No evidence of bowel obstruction (series 2/image 55). No colonic wall thickening or inflammatory changes. Vascular/Lymphatic: No evidence of abdominal aortic aneurysm. Atherosclerotic calcifications of the abdominal aorta and branch vessels. No suspicious abdominopelvic lymphadenopathy. Reproductive: Prostate is grossly unremarkable. Other: No abdominopelvic ascites. Small fat/fluid containing right inguinal hernia, unchanged. Musculoskeletal: Mild degenerative changes at L4-5. IMPRESSION: Status post cholecystectomy. Otherwise unremarkable CT abdomen/pelvis. Electronically Signed   By: Julian Hy M.D.   On: 10/30/2020 20:22   DG Chest Port 1 View  Result Date: 11/02/2020 CLINICAL DATA:  Shortness of breath and chest pain EXAM: PORTABLE CHEST 1 VIEW COMPARISON:  10/30/2020, 10/26/2020, 08/10/2020, 04/14/2020 FINDINGS: Right-sided central venous catheter tip over the SVC. Cardiomegaly with vascular congestion and probable mild interstitial edema. No pleural effusion or pneumothorax. IMPRESSION: Mild cardiomegaly with central congestion and slight interstitial edema Electronically Signed   By: Donavan Foil M.D.   On: 11/02/2020 19:31   DG Chest Port 1 View  Result Date: 10/30/2020 CLINICAL DATA:  Shortness of breath, chest pain EXAM: PORTABLE CHEST 1 VIEW COMPARISON:  10/26/2020 FINDINGS: Right-sided hemodialysis catheter terminates at the  level of the superior cavoatrial junction. Stable mild cardiomegaly. Mild diffuse interstitial prominence is similar to prior. No focal airspace consolidation. No pleural effusion or pneumothorax. IMPRESSION: Mild cardiomegaly with mild diffuse interstitial prominence which may reflect interstitial edema. Electronically Signed   By: Davina Poke D.O.   On: 10/30/2020 12:02   DG Chest Portable 1 View  Result Date: 10/26/2020 CLINICAL DATA:  Shortness of breath.  Missed dialysis EXAM: PORTABLE CHEST 1 VIEW COMPARISON:  09/26/2020 FINDINGS: Right-sided hemodialysis catheter terminates at the level of the cavoatrial junction. Stable mild cardiomegaly. Mild diffuse interstitial prominence likely reflecting a component of mild edema. No focal airspace consolidation. No pleural effusion or pneumothorax. IMPRESSION: Mild diffuse interstitial prominence likely reflecting mild pulmonary edema. Electronically Signed   By: Davina Poke D.O.   On: 10/26/2020 16:03    Microbiology: Recent Results (from the past 240 hour(s))  SARS CORONAVIRUS 2 (Barnaby Rippeon 6-24 HRS) Nasopharyngeal Nasopharyngeal Swab     Status: None   Collection Time: 11/02/20 11:10 PM   Specimen: Nasopharyngeal Swab  Result Value Ref Range Status   SARS Coronavirus 2 NEGATIVE NEGATIVE Final    Comment: (NOTE) SARS-CoV-2 target nucleic acids are NOT DETECTED.  The SARS-CoV-2 RNA is generally detectable in upper and lower respiratory specimens during the acute phase of infection. Negative results do not preclude SARS-CoV-2 infection, do not rule out co-infections with  other pathogens, and should not be used as the sole basis for treatment or other patient management decisions. Negative results must be combined with clinical observations, patient history, and epidemiological information. The expected result is Negative.  Fact Sheet for Patients: SugarRoll.be  Fact Sheet for Healthcare  Providers: https://www.woods-mathews.com/  This test is not yet approved or cleared by the [place] FDA and  has been authorized for detection and/or diagnosis of SARS-CoV-2 by FDA under an Emergency Use Authorization (EUA). This EUA will remain  in effect (meaning this test can be used) for the duration of the COVID-19 declaration under Se ction 564(b)(1) of the Act, 21 U.S.C. section 360bbb-3(b)(1), unless the authorization is terminated or revoked sooner.  Performed at [facility], [place] [address], [place], [place] [address]      Labs: Basic Metabolic Panel: No results for input(s): NA, K, CL, CO2, GLUCOSE, BUN, CREATININE, CALCIUM, MG, PHOS in the last 168 hours. Liver Function Tests: No results for input(s): AST, ALT, ALKPHOS, BILITOT, PROT, ALBUMIN in the last 168 hours. No results for input(s): LIPASE, AMYLASE in the last 168 hours. No results for input(s): AMMONIA in the last 168 hours. CBC: No results for input(s): WBC, NEUTROABS, HGB, HCT, MCV, PLT in the last 168 hours. Cardiac Enzymes: No results for input(s): CKTOTAL, CKMB, CKMBINDEX, TROPONINI in the last 168 hours. BNP: Invalid input(s): POCBNP CBG: No results for input(s): GLUCAP in the last 168 hours.  Time coordinating discharge:  36 minutes  Signed:  Orson Eva, DO [facility] Pager: [phone] 11/10/2020, 3:55 PM

## 2021-01-14 DEATH — deceased

## 2021-02-16 ENCOUNTER — Ambulatory Visit: Payer: Medicaid Other | Admitting: Neurology

## 2021-12-18 IMAGING — CR DG CHEST 2V
2 series · 2 of 2 positions shown · non-contrast
Comparison: December 14, 2018

CLINICAL DATA: Fluid retention for 4 days.

EXAM:
CHEST - 2 VIEW

[chest pa]
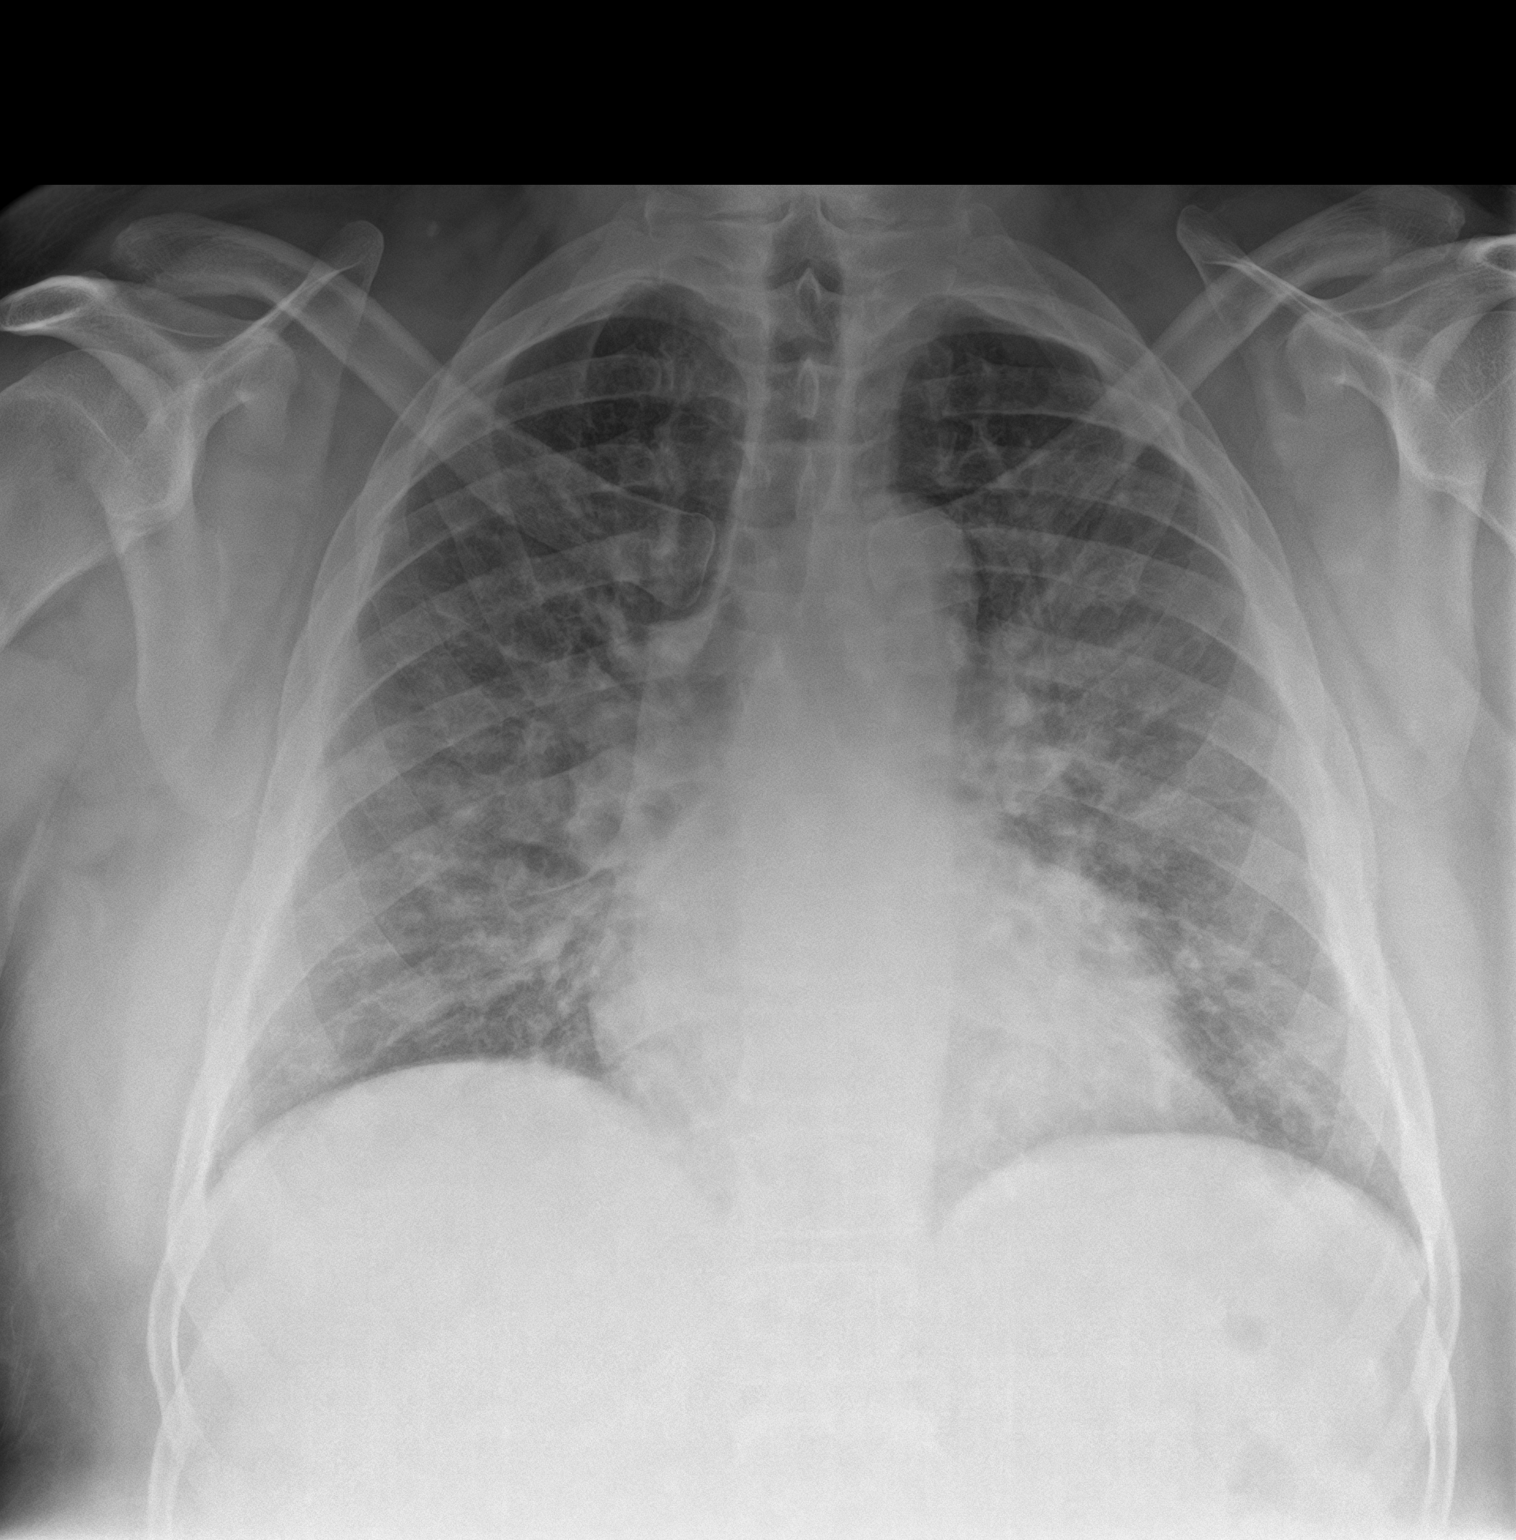

[chest lat]
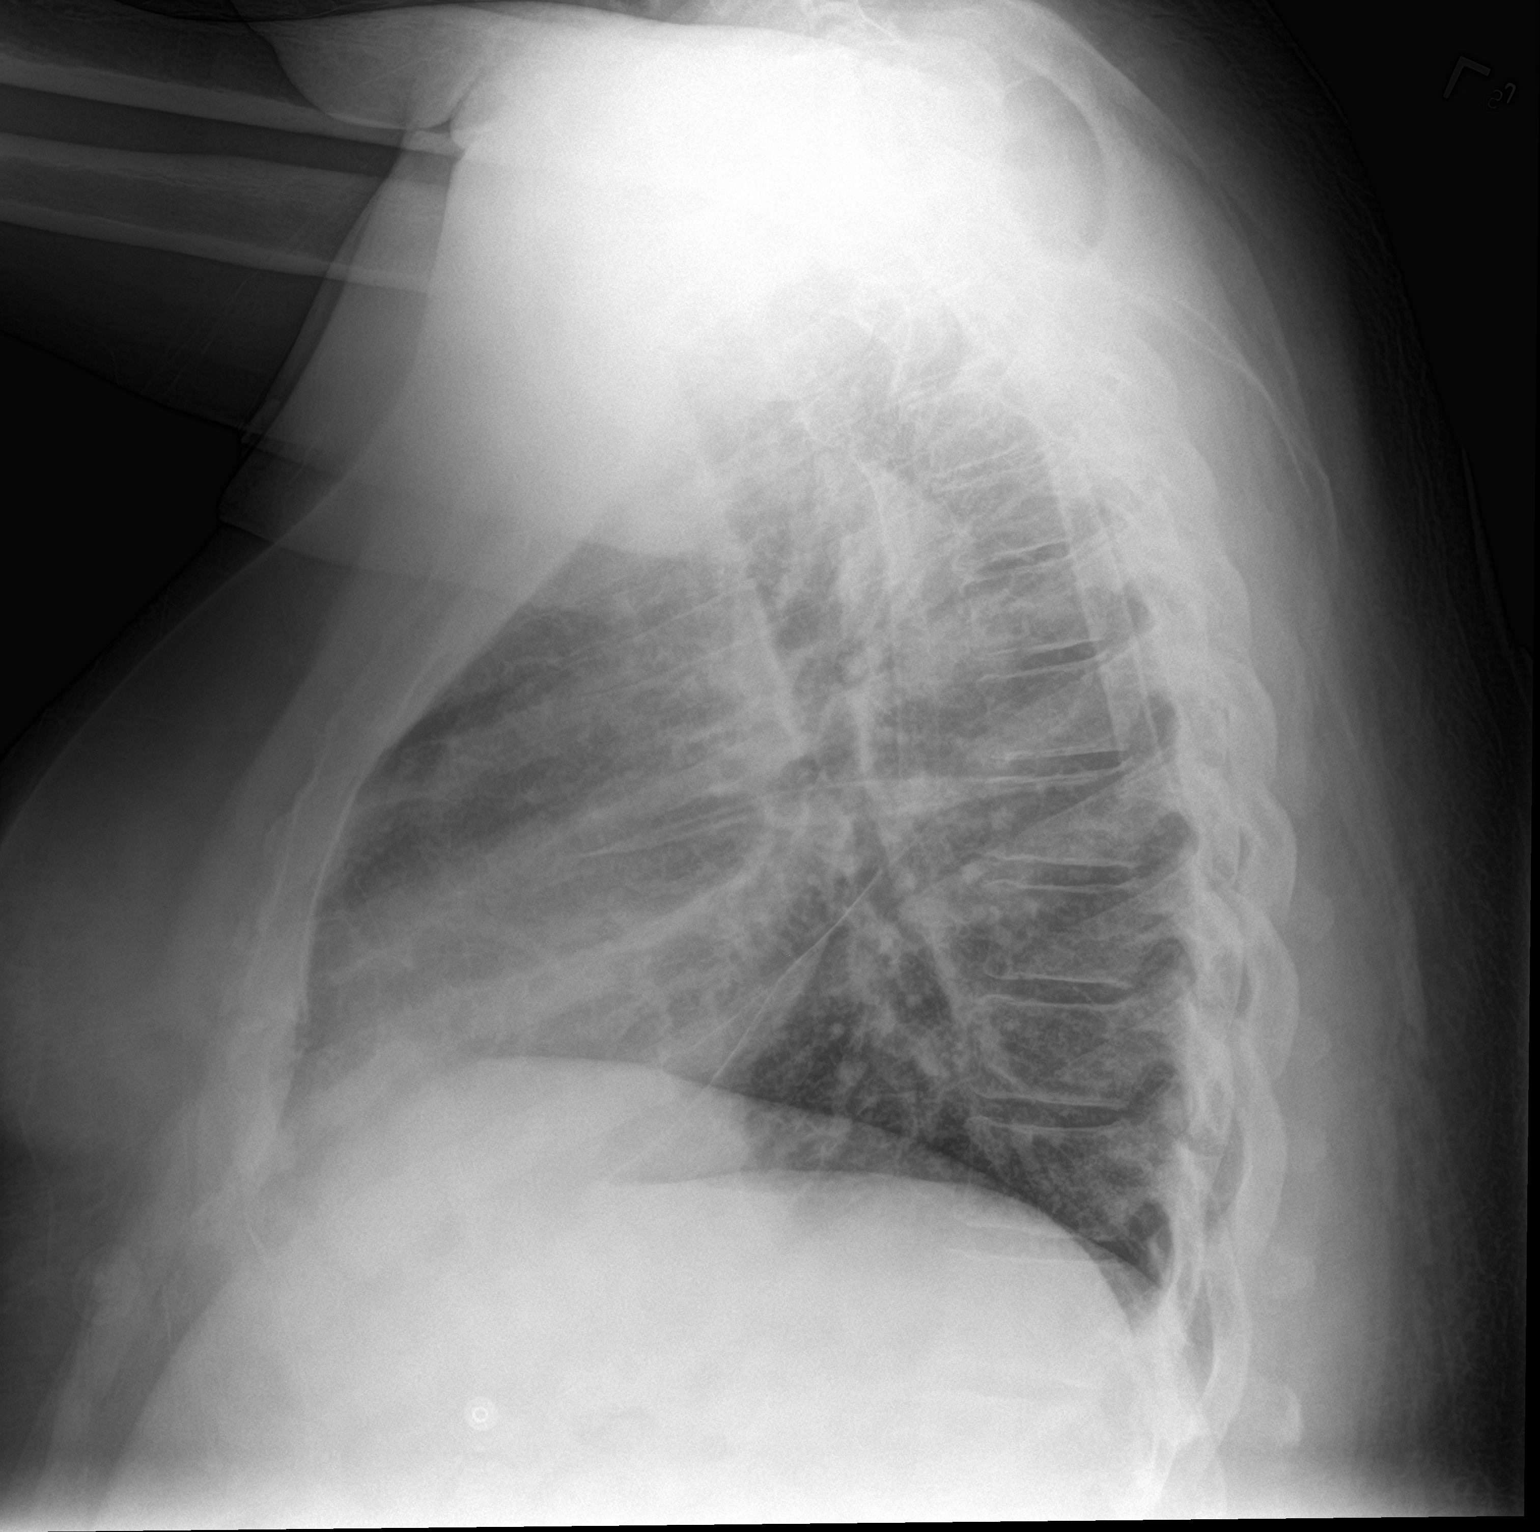

[2 of 2 positions shown; findings below may reference images not displayed]

FINDINGS: Bilateral pulmonary opacities are new in the interval. The heart
size is borderline. The hila and mediastinum are normal. No
pneumothorax. No nodules or masses.
IMPRESSION: Bilateral pulmonary opacities could represent pulmonary edema given
history. However, an infectious process, including atypical
infections, is not excluded on this study. There is no cardiomegaly.

## 2023-01-10 IMAGING — US IR THROMBECTOMY AV FISTULA W/THROMBOLYSIS/PTA/STENT INC/SHUNT/IM
1 series · 3 of 3 positions shown · non-contrast
Comparison: None.

INDICATION: 59-year-old male with left axillary to axillary loop arteriovenous
hemodialysis graft created 04/05/2020 presenting with graft
thrombosis.

EXAM:
1. Ultrasound-guided arteriovenous graft access x2
2. Pharmacomechanical thrombolysis of arteriovenous graft
3. Rheolytic thrombectomy of arteriovenous graft
4. Balloon sweep thrombectomy of arterial anastomosis
5. Balloon angioplasty of arteriovenous graft

[Series 1: ir thrombectomy av fistula w/thrombolysis/pta/sten · 3 of 3 slices shown]
[im 1/3]
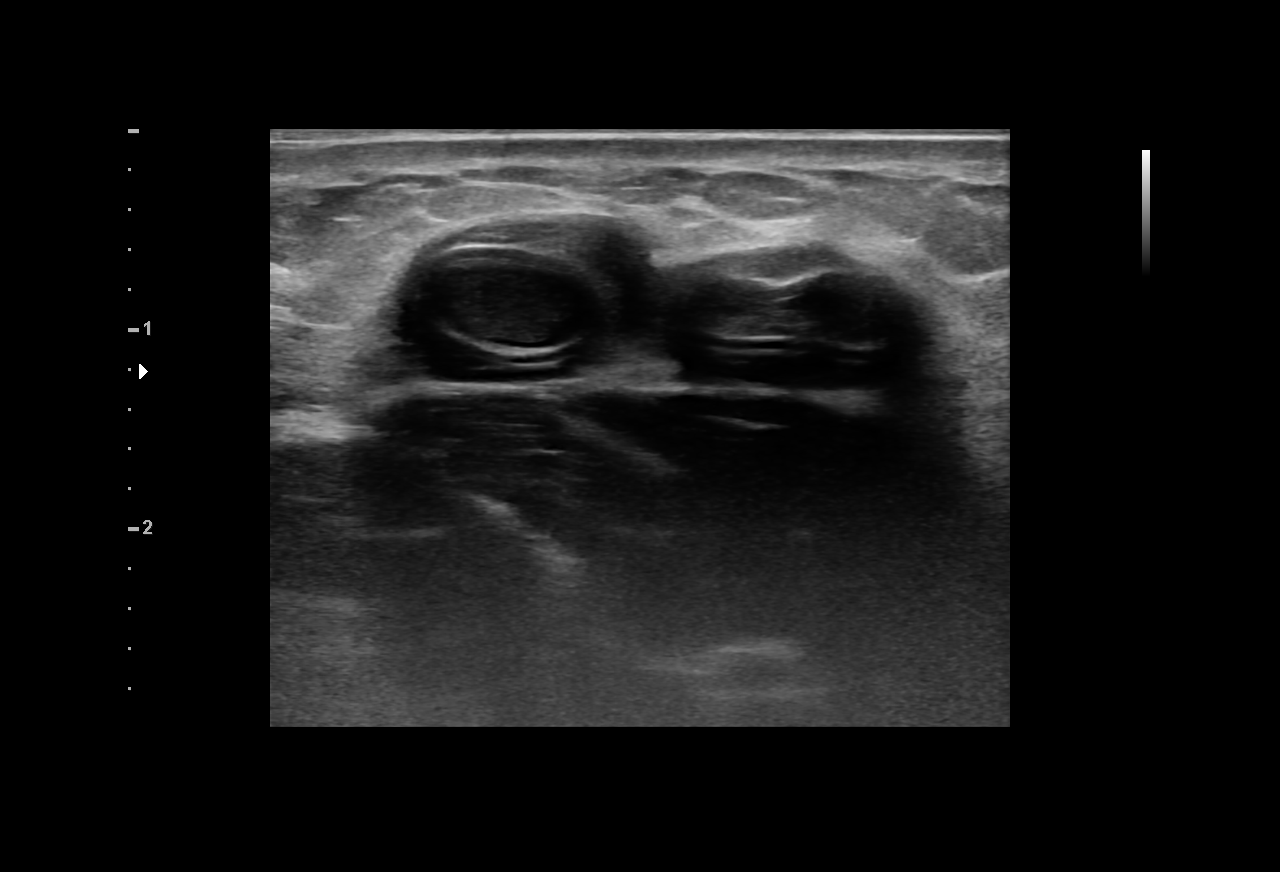
[im 2/3]
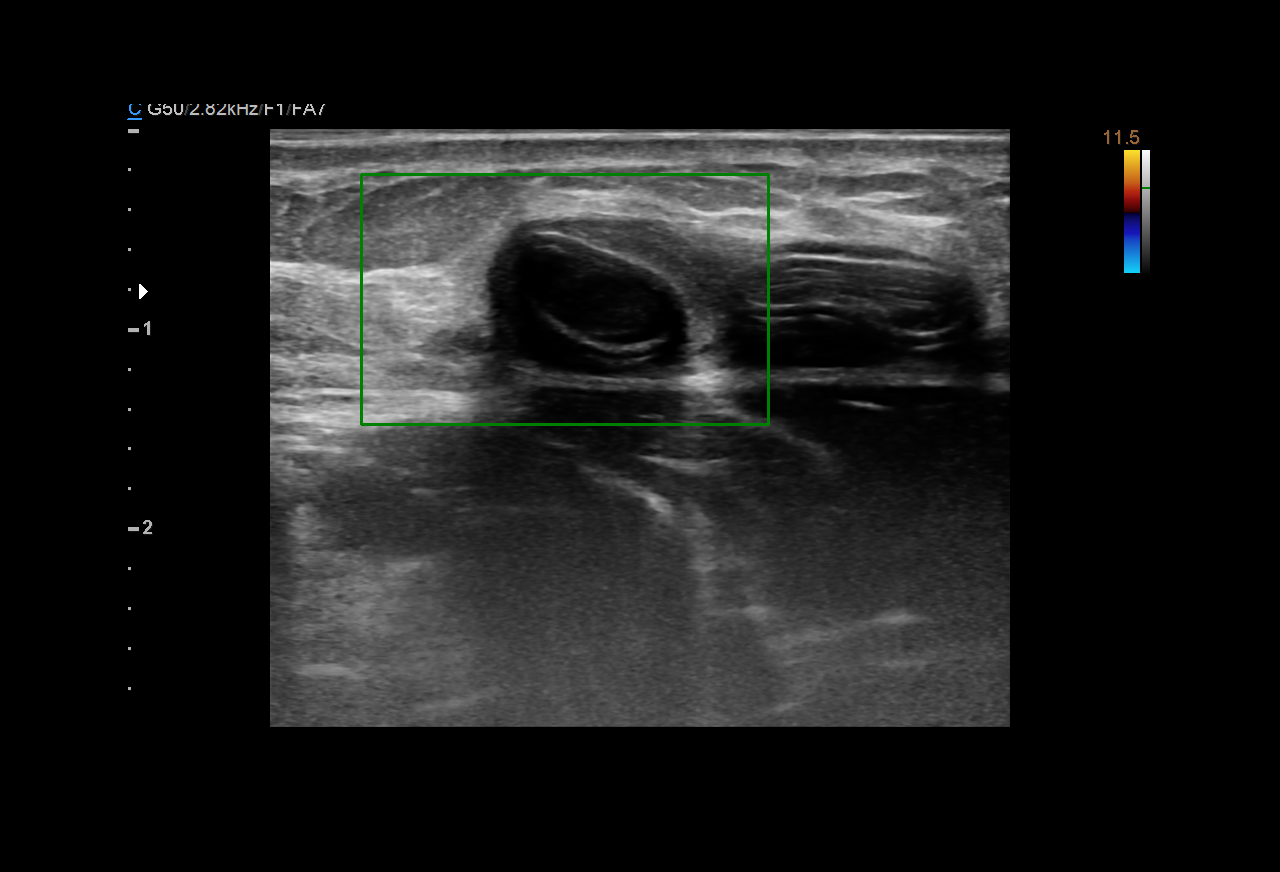
[im 3/3]
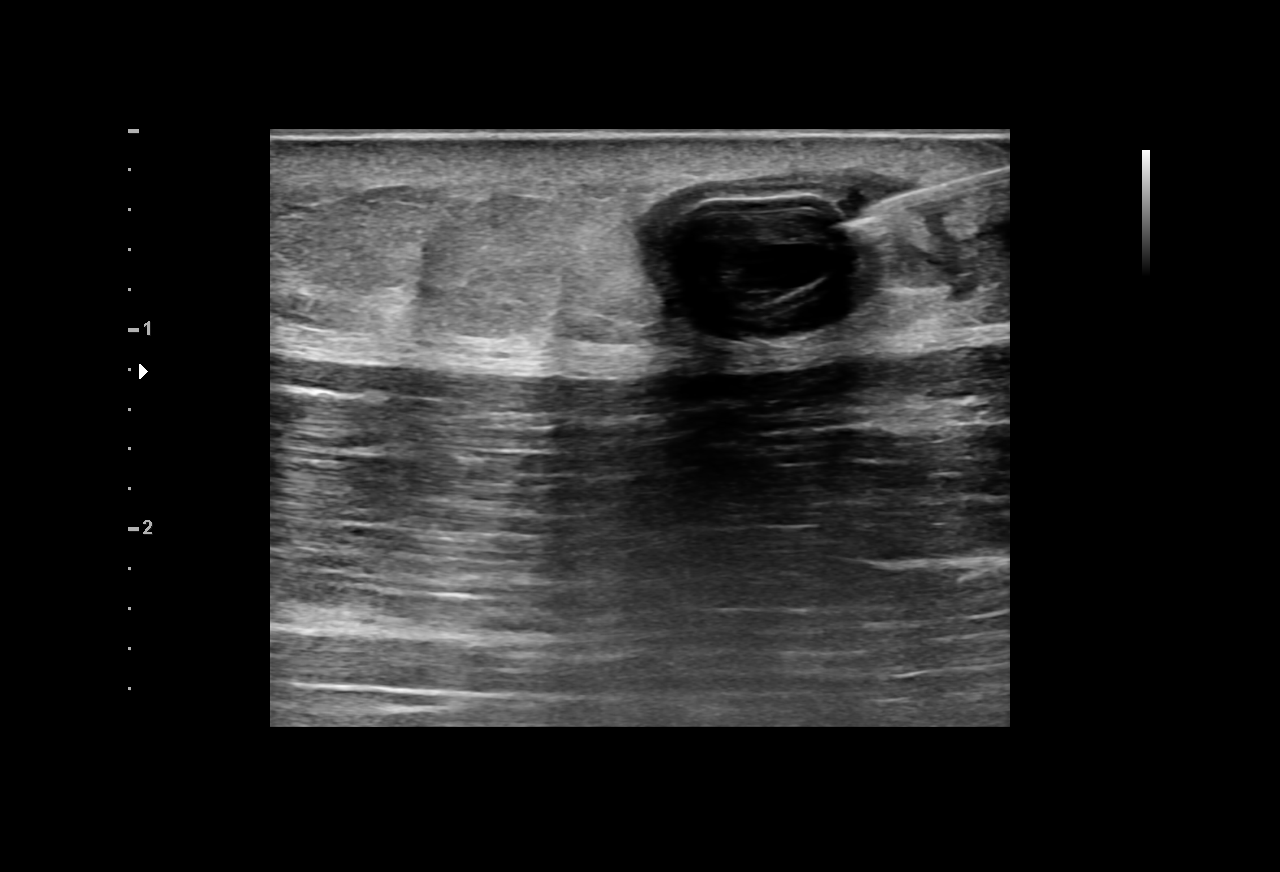

[3 of 3 positions shown; findings below may reference images not displayed]

MEDICATIONS:
None.

CONTRAST:  80mL OMNIPAQUE IOHEXOL 300 MG/ML  SOLN

ANESTHESIA/SEDATION:
Moderate (conscious) sedation was employed during this procedure. A
total of Versed 1.5 mg and Fentanyl 75 mcg was administered
intravenously.

Moderate Sedation Time: 99 minutes. The patient's level of
consciousness and vital signs were monitored continuously by
radiology nursing throughout the procedure under my direct
supervision.

FLUOROSCOPY TIME:  19 minutes, 18 seconds, 81 mGy

COMPLICATIONS:
None immediate.

PROCEDURE:
Informed written consent was obtained from the patient after a
discussion of the risk, benefits and alternatives to treatment.
Questions regarding the procedure were encouraged and answered. A
timeout was performed prior to the initiation of the procedure.

The left arm dialysis graft was prepped with Chlorhexidine in a
sterile fashion, and a sterile drape was applied covering the
operative field. Ultrasound evaluation of the entire graft was
performed. Antegrade access near the graft apex was performed under
ultrasound guidance with a micropuncture set. Over Desta Colima wire, a
6 French sheath was placed. A Glidewire and angled catheter were
directed to the left subclavian vein. Pull-back venogram was
performed. Pharmacomechanical thrombolysis was performed of the
venous outflow portion of the graft. Retrograde access was then
obtained under ultrasound guidance with a micropuncture set. Over Eldar
Pettit wire, a 6 French sheath was placed. A Glidewire and angled
catheter were directed to the axillary artery. Pharmacomechanical
thrombolysis was performed in the arterial limb of the graft. Using
a Fogarty balloon, balloon sweep was performed of the arterial
anastomosis 3 times. Rheolytic thrombectomy was then performed
throughout the arterial limb of the graft. This was followed by
rheolytic therapy throughout the venous aspect of the graft.
Fistulagram from the axillary artery demonstrated severe focal
stenosis near the arterial anastomosis. Balloon angioplasty was
performed at the stenotic site with a 4 mm x 2 cm mustang balloon.
There is improved patency inflow after angioplasty. Additional
moderate stenosis was noted in the graft apex. Balloon angioplasty
with a 7 mm x 4 cm mustang balloon was performed. There are
multifocal stenoses near the axillary vein anastomosis. Balloon
angioplasty was performed multiple sites. Completion fistulagram was
then performed.

The sheaths was removed and hemostasis obtained with application of
a 2-0 Ethilon pursestring suture which will be removed at the
patient's next dialysis session. Dressings were placed. The patient
tolerated the procedure well without immediate postprocedural
complication.
FINDINGS: Complete thrombosis of the left axillary to axillary loop
arteriovenous graft. Technically successful pharmacomechanical
thrombolysis and rheolytic thrombectomy throughout the graft.
High-grade focal stenosis at the graft arterial anastomosis which
improved after angioplasty. Additional angioplasty was required at
the graft apex as well as the venous anastomosis. Self-limiting
axillary hematoma was observed after wire cannulation cross the
venous anastomosis. There is complete cessation of extravasation
after balloon tamponade. Upon completion, there was palpable thrill
throughout the graft and brisk antegrade flow on completion
fistulogram.
IMPRESSION: 1. Thrombosed left axillary to axillary loop arteriovenous graft.
2. Technically successful declot of graft with pharmacomechanical
thrombolysis, rheolytic thrombectomy, Fogarty arterial anastomosis
balloon sweep, and balloon angioplasty.

ACCESS:
This access remains amenable to future percutaneous interventions as
clinically indicated.
